# Patient Record
Sex: Male | Born: 1955 | Race: Black or African American | Hispanic: No | Marital: Married | Smoking: Former smoker
Health system: Southern US, Community
[De-identification: ages and names within clinical notes are randomized; demographics above are authoritative.]

## PROBLEM LIST (undated history)

## (undated) DIAGNOSIS — F1910 Other psychoactive substance abuse, uncomplicated: Secondary | ICD-10-CM

## (undated) DIAGNOSIS — C801 Malignant (primary) neoplasm, unspecified: Secondary | ICD-10-CM

## (undated) DIAGNOSIS — Z923 Personal history of irradiation: Secondary | ICD-10-CM

## (undated) DIAGNOSIS — E785 Hyperlipidemia, unspecified: Secondary | ICD-10-CM

## (undated) DIAGNOSIS — I351 Nonrheumatic aortic (valve) insufficiency: Secondary | ICD-10-CM

## (undated) DIAGNOSIS — I10 Essential (primary) hypertension: Secondary | ICD-10-CM

## (undated) DIAGNOSIS — R0600 Dyspnea, unspecified: Secondary | ICD-10-CM

## (undated) DIAGNOSIS — T7840XA Allergy, unspecified, initial encounter: Secondary | ICD-10-CM

## (undated) DIAGNOSIS — K219 Gastro-esophageal reflux disease without esophagitis: Secondary | ICD-10-CM

## (undated) DIAGNOSIS — I739 Peripheral vascular disease, unspecified: Secondary | ICD-10-CM

## (undated) DIAGNOSIS — I70219 Atherosclerosis of native arteries of extremities with intermittent claudication, unspecified extremity: Secondary | ICD-10-CM

## (undated) DIAGNOSIS — J439 Emphysema, unspecified: Secondary | ICD-10-CM

## (undated) DIAGNOSIS — R011 Cardiac murmur, unspecified: Secondary | ICD-10-CM

## (undated) HISTORY — DX: Nonrheumatic aortic (valve) insufficiency: I35.1

## (undated) HISTORY — DX: Gastro-esophageal reflux disease without esophagitis: K21.9

## (undated) HISTORY — DX: Cardiac murmur, unspecified: R01.1

## (undated) HISTORY — DX: Hyperlipidemia, unspecified: E78.5

## (undated) HISTORY — DX: Peripheral vascular disease, unspecified: I73.9

## (undated) HISTORY — DX: Emphysema, unspecified: J43.9

## (undated) HISTORY — DX: Other psychoactive substance abuse, uncomplicated: F19.10

## (undated) HISTORY — PX: NO PAST SURGERIES: SHX2092

## (undated) HISTORY — DX: Atherosclerosis of native arteries of extremities with intermittent claudication, unspecified extremity: I70.219

## (undated) HISTORY — DX: Allergy, unspecified, initial encounter: T78.40XA

---

## 2000-12-13 ENCOUNTER — Emergency Department (HOSPITAL_COMMUNITY): Admission: EM | Admit: 2000-12-13 | Discharge: 2000-12-13 | Payer: Self-pay | Admitting: Emergency Medicine

## 2000-12-13 ENCOUNTER — Encounter: Payer: Self-pay | Admitting: Emergency Medicine

## 2001-05-12 ENCOUNTER — Encounter: Payer: Self-pay | Admitting: Chiropractor

## 2001-05-12 ENCOUNTER — Ambulatory Visit (HOSPITAL_COMMUNITY): Admission: RE | Admit: 2001-05-12 | Discharge: 2001-05-12 | Payer: Self-pay | Admitting: Chiropractor

## 2011-12-20 ENCOUNTER — Emergency Department (HOSPITAL_COMMUNITY): Payer: BC Managed Care – PPO

## 2011-12-20 ENCOUNTER — Encounter (HOSPITAL_COMMUNITY): Payer: Self-pay | Admitting: Internal Medicine

## 2011-12-20 ENCOUNTER — Observation Stay (HOSPITAL_COMMUNITY)
Admission: EM | Admit: 2011-12-20 | Discharge: 2011-12-22 | Disposition: A | Discharge status: Home / Self Care | Payer: BC Managed Care – PPO | Attending: Internal Medicine | Admitting: Internal Medicine

## 2011-12-20 DIAGNOSIS — Z7982 Long term (current) use of aspirin: Secondary | ICD-10-CM | POA: Insufficient documentation

## 2011-12-20 DIAGNOSIS — R0602 Shortness of breath: Secondary | ICD-10-CM | POA: Insufficient documentation

## 2011-12-20 DIAGNOSIS — I10 Essential (primary) hypertension: Secondary | ICD-10-CM | POA: Insufficient documentation

## 2011-12-20 DIAGNOSIS — I080 Rheumatic disorders of both mitral and aortic valves: Secondary | ICD-10-CM | POA: Insufficient documentation

## 2011-12-20 DIAGNOSIS — R030 Elevated blood-pressure reading, without diagnosis of hypertension: Secondary | ICD-10-CM

## 2011-12-20 DIAGNOSIS — I498 Other specified cardiac arrhythmias: Secondary | ICD-10-CM | POA: Insufficient documentation

## 2011-12-20 DIAGNOSIS — R079 Chest pain, unspecified: Secondary | ICD-10-CM

## 2011-12-20 DIAGNOSIS — Z720 Tobacco use: Secondary | ICD-10-CM | POA: Diagnosis present

## 2011-12-20 DIAGNOSIS — K219 Gastro-esophageal reflux disease without esophagitis: Secondary | ICD-10-CM | POA: Insufficient documentation

## 2011-12-20 DIAGNOSIS — I5030 Unspecified diastolic (congestive) heart failure: Secondary | ICD-10-CM | POA: Insufficient documentation

## 2011-12-20 DIAGNOSIS — R42 Dizziness and giddiness: Secondary | ICD-10-CM | POA: Insufficient documentation

## 2011-12-20 DIAGNOSIS — F17200 Nicotine dependence, unspecified, uncomplicated: Secondary | ICD-10-CM | POA: Insufficient documentation

## 2011-12-20 DIAGNOSIS — IMO0001 Reserved for inherently not codable concepts without codable children: Secondary | ICD-10-CM

## 2011-12-20 DIAGNOSIS — R001 Bradycardia, unspecified: Secondary | ICD-10-CM

## 2011-12-20 DIAGNOSIS — R0789 Other chest pain: Principal | ICD-10-CM | POA: Insufficient documentation

## 2011-12-20 HISTORY — DX: Essential (primary) hypertension: I10

## 2011-12-20 LAB — POCT I-STAT TROPONIN I: Troponin i, poc: 0.02 ng/mL (ref 0.00–0.08)

## 2011-12-20 LAB — POCT I-STAT, CHEM 8
Hemoglobin: 14.6 g/dL (ref 13.0–17.0)
Sodium: 142 mEq/L (ref 135–145)
TCO2: 26 mmol/L (ref 0–100)

## 2011-12-20 NOTE — H&P (Signed)
Albert Hardy. is an 56 y.o. male.   Patient was seen and examined on December 20, 2011. PCP - none. Chief Complaint: Chest pain. HPI: 56 year-old male with ongoing tobacco abuse presents with complaints of chest pain. Patient has been having these symptoms for last 3 days. The pain is on the left side pressure-like sometimes radiating to neck and the left hand associated with mild shortness of breath. The pain lasted for around 15-20 minutes and got relieved spontaneously. In the ER cardiac enzymes and chest x-ray was unremarkable. EKG shows T-wave inversions in inferior and lateral leads. Patient's blood pressure was also elevated. Patient states that he had seen physicians earlier couple of years ago and was told he had normal blood pressure. Patient denies any drug abuse and only drinks alcohol very occasionally. Denies any nausea vomiting abdominal pain fever chills or any productive cough. Patient has been admitted for further management.  History reviewed. No pertinent past medical history.  History reviewed. No pertinent past surgical history.  Family History  Problem Relation Age of Onset  . Other Brother    Social History:  reports that he has been smoking.  He does not have any smokeless tobacco history on file. He reports that he drinks alcohol. He reports that he does not use illicit drugs.  Allergies: No Known Allergies   (Not in a hospital admission)  Results for orders placed during the hospital encounter of 12/20/11 (from the past 48 hour(s))  POCT I-STAT TROPONIN I     Status: Normal   Collection Time   12/20/11  9:29 PM      Component Value Range Comment   Troponin i, poc 0.02  0.00 - 0.08 ng/mL    Comment 3            POCT I-STAT, CHEM 8     Status: Abnormal   Collection Time   12/20/11  9:32 PM      Component Value Range Comment   Sodium 142  135 - 145 mEq/L    Potassium 4.2  3.5 - 5.1 mEq/L    Chloride 106  96 - 112 mEq/L    BUN 22  6 - 23 mg/dL    Creatinine, Ser 1.61  0.50 - 1.35 mg/dL    Glucose, Bld 096 (*) 70 - 99 mg/dL    Calcium, Ion 0.45  4.09 - 1.23 mmol/L    TCO2 26  0 - 100 mmol/L    Hemoglobin 14.6  13.0 - 17.0 g/dL    HCT 81.1  91.4 - 78.2 %    Dg Chest Portable 1 View  12/20/2011  *RADIOLOGY REPORT*  Clinical Data: Chest pain  PORTABLE CHEST - 1 VIEW  Comparison: None  Findings: The heart size and mediastinal contours are within normal limits.  Both lungs are clear.  The visualized skeletal structures are unremarkable.  IMPRESSION: Negative exam.   Original Report Authenticated By: Signa Kell, M.D.     Review of Systems  Constitutional: Negative.   HENT: Negative.   Eyes: Negative.   Respiratory: Negative.   Cardiovascular: Positive for chest pain.  Gastrointestinal: Negative.   Genitourinary: Negative.   Musculoskeletal: Negative.   Skin: Negative.   Neurological: Negative.   Endo/Heme/Allergies: Negative.   Psychiatric/Behavioral: Negative.     Blood pressure 150/54, pulse 47, temperature 98.6 F (37 C), temperature source Oral, resp. rate 15, height 5\' 9"  (1.753 m), weight 80.74 kg (178 lb), SpO2 100.00%. Physical Exam  Constitutional: He is oriented  to person, place, and time. He appears well-developed and well-nourished. No distress.  HENT:  Head: Normocephalic and atraumatic.  Right Ear: External ear normal.  Left Ear: External ear normal.  Nose: Nose normal.  Mouth/Throat: Oropharynx is clear and moist. No oropharyngeal exudate.  Eyes: Conjunctivae normal are normal. Pupils are equal, round, and reactive to light. Right eye exhibits no discharge. Left eye exhibits no discharge. No scleral icterus.  Neck: Normal range of motion. Neck supple.  Cardiovascular: Normal rate and regular rhythm.   Respiratory: Effort normal and breath sounds normal.  GI: Soft. Bowel sounds are normal. He exhibits no distension. There is no tenderness. There is no rebound.  Musculoskeletal: He exhibits no edema and no  tenderness.  Neurological: He is alert and oriented to person, place, and time.       Moves all extremities.  Skin: Skin is dry. He is not diaphoretic.     Assessment/Plan #1. Chest pain to rule out ACS - patient's symptoms are typical. At this time I have consulted on call cardiologist Dr. Sharyn Lull. Cardiologist will be seeing patient in consult. Given his EKG changes and symptoms patient will be kept n.p.o. for possible procedure. Check d-dimer. Aspirin. Check urine drug screen. #2. Elevated blood pressure - patient has been placed on nitroglycerin patch and when necessary IV hydralazine for systolic blood pressure was 160. #3. Tobacco abuse - patient advised to quit smoking.  CODE STATUS - full code.  KAKRAKANDY,ARSHAD N. 12/20/2011, 11:28 PM

## 2011-12-20 NOTE — ED Notes (Signed)
EKG shown to Dr. Jacubowitz. 

## 2011-12-20 NOTE — ED Notes (Signed)
Pt c/o chest pain x1 week.pt is alert. Pt feels tinglings up neck and down left arm.

## 2011-12-20 NOTE — ED Provider Notes (Signed)
History     CSN: [id number]  Arrival date & time 12/20/11  2013   First MD Initiated Contact with Patient 12/20/11 2047      Chief Complaint  Patient presents with  . Chest Pain     HPI 56 year old male presents with intermittent chest pain started today.  Associated with palpitations and some numbness in left arm.  Patient is asymptomatic at present time.  Discomfort associated with some dizziness and shortness of breath.  No diaphoresis.  Patient has risk factors including smoking.  Unsure about elevated cholesterol.  No family history.   No past medical history on file.  No past surgical history on file.  No family history on file.  History  Substance Use Topics  . Smoking status: Not on file  . Smokeless tobacco: Not on file  . Alcohol Use: Not on file      Review of Systems All other systems reviewed and are negative Allergies  Review of patient's allergies indicates no known allergies.  Home Medications   Current Outpatient Rx  Name  Route  Sig  Dispense  Refill  . ASPIRIN 81 MG PO TABS   Oral   Take 81 mg by mouth daily.         . IBUPROFEN 200 MG PO TABS   Oral   Take 200 mg by mouth every 6 (six) hours as needed. Pain           BP 150/54  Pulse 47  Temp 98.6 F (37 C) (Oral)  Resp 15  Ht 5\' 9"  (1.753 m)  Wt 178 lb (80.74 kg)  BMI 26.29 kg/m2  SpO2 100%  Physical Exam  Nursing note and vitals reviewed. Constitutional: He is oriented to person, place, and time. He appears well-developed and well-nourished. No distress.  HENT:  Head: Normocephalic and atraumatic.  Eyes: Pupils are equal, round, and reactive to light.  Neck: Normal range of motion.  Cardiovascular: Normal rate and intact distal pulses.   Pulmonary/Chest: No respiratory distress. He has no wheezes. He has no rales.  Abdominal: Normal appearance. He exhibits no distension.  Musculoskeletal: Normal range of motion.  Neurological: He is alert and oriented to person,  place, and time. No cranial nerve deficit.  Skin: Skin is warm and dry. No rash noted.  Psychiatric: He has a normal mood and affect. His behavior is normal.    ED Course  Procedures (including critical care time)  EKG: SINUS RHYTHM ~ normal P axis,  V-rate 60 PROBABLE LEFT ATRIAL ABNORMALITY  ABNORMAL T, CONSIDER ISCHEMIA,  Abnormal ECG    Labs Reviewed  POCT I-STAT, CHEM 8 - Abnormal; Notable for the following:    Glucose, Bld 130 (*)     All other components within normal limits  POCT I-STAT TROPONIN I   Dg Chest Portable 1 View  12/20/2011  *RADIOLOGY REPORT*  Clinical Data: Chest pain  PORTABLE CHEST - 1 VIEW  Comparison: None  Findings: The heart size and mediastinal contours are within normal limits.  Both lungs are clear.  The visualized skeletal structures are unremarkable.  IMPRESSION: Negative exam.   Original Report Authenticated By: Signa Kell, M.D.      1. Chest pain       MDM  Plan on admission.        Nelia Shi, MD 12/20/11 2258

## 2011-12-21 ENCOUNTER — Encounter (HOSPITAL_COMMUNITY): Payer: Self-pay | Admitting: *Deleted

## 2011-12-21 DIAGNOSIS — F17200 Nicotine dependence, unspecified, uncomplicated: Secondary | ICD-10-CM

## 2011-12-21 DIAGNOSIS — I498 Other specified cardiac arrhythmias: Secondary | ICD-10-CM

## 2011-12-21 LAB — LIPID PANEL
Cholesterol: 214 mg/dL — ABNORMAL HIGH (ref 0–200)
Triglycerides: 49 mg/dL (ref <150)
VLDL: 10 mg/dL (ref 0–40)

## 2011-12-21 LAB — COMPREHENSIVE METABOLIC PANEL
CO2: 25 mEq/L (ref 19–32)
Calcium: 8.8 mg/dL (ref 8.4–10.5)
Creatinine, Ser: 0.89 mg/dL (ref 0.50–1.35)
GFR calc Af Amer: >90 mL/min (ref >90)
GFR calc non Af Amer: >90 mL/min (ref >90)
Glucose, Bld: 90 mg/dL (ref 70–99)

## 2011-12-21 LAB — CBC WITH DIFFERENTIAL/PLATELET
Basophils Relative: 0 % (ref 0–1)
Hemoglobin: 13.9 g/dL (ref 13.0–17.0)
Lymphs Abs: 2.1 10*3/uL (ref 0.7–4.0)
MCHC: 34.7 g/dL (ref 30.0–36.0)
Monocytes Relative: 12 % (ref 3–12)
Neutro Abs: 2.4 10*3/uL (ref 1.7–7.7)
Neutrophils Relative %: 47 % (ref 43–77)
RBC: 4.63 MIL/uL (ref 4.22–5.81)

## 2011-12-21 LAB — RAPID URINE DRUG SCREEN, HOSP PERFORMED
Barbiturates: NOT DETECTED
Benzodiazepines: NOT DETECTED
Cocaine: NOT DETECTED
Tetrahydrocannabinol: POSITIVE — AB

## 2011-12-21 LAB — CBC
HCT: 38.3 % — ABNORMAL LOW (ref 39.0–52.0)
Hemoglobin: 13.2 g/dL (ref 13.0–17.0)
MCH: 29.9 pg (ref 26.0–34.0)
MCHC: 34.5 g/dL (ref 30.0–36.0)
RDW: 13.5 % (ref 11.5–15.5)

## 2011-12-21 LAB — CREATININE, SERUM: Creatinine, Ser: 0.9 mg/dL (ref 0.50–1.35)

## 2011-12-21 LAB — GLUCOSE, CAPILLARY
Glucose-Capillary: 79 mg/dL (ref 70–99)
Glucose-Capillary: 89 mg/dL (ref 70–99)

## 2011-12-21 LAB — TSH: TSH: 0.535 u[IU]/mL (ref 0.350–4.500)

## 2011-12-21 MED ORDER — LISINOPRIL 20 MG PO TABS
20.0000 mg | ORAL_TABLET | Freq: Every day | ORAL | Status: DC
  Administered 2011-12-21 – 2011-12-22 (×2): 20 mg via ORAL
  Filled 2011-12-21 (×2): qty 1

## 2011-12-21 MED ORDER — SODIUM CHLORIDE 0.9 % IJ SOLN
3.0000 mL | Freq: Two times a day (BID) | INTRAMUSCULAR | Status: DC
  Administered 2011-12-21 – 2011-12-22 (×2): 3 mL via INTRAVENOUS

## 2011-12-21 MED ORDER — NICOTINE 14 MG/24HR TD PT24
14.0000 mg | MEDICATED_PATCH | Freq: Every day | TRANSDERMAL | Status: DC
  Administered 2011-12-21 – 2011-12-22 (×2): 14 mg via TRANSDERMAL
  Filled 2011-12-21 (×3): qty 1

## 2011-12-21 MED ORDER — SODIUM CHLORIDE 0.9 % IJ SOLN
3.0000 mL | Freq: Two times a day (BID) | INTRAMUSCULAR | Status: DC
  Administered 2011-12-21: 3 mL via INTRAVENOUS

## 2011-12-21 MED ORDER — ONDANSETRON HCL 4 MG PO TABS: 4.0000 mg | ORAL_TABLET | Freq: Four times a day (QID) | ORAL | Status: DC | PRN

## 2011-12-21 MED ORDER — HYDRALAZINE HCL 20 MG/ML IJ SOLN
10.0000 mg | INTRAMUSCULAR | Status: DC | PRN
  Administered 2011-12-21: 10 mg via INTRAVENOUS
  Filled 2011-12-21: qty 1

## 2011-12-21 MED ORDER — NITROGLYCERIN 0.4 MG/HR TD PT24
0.4000 mg | MEDICATED_PATCH | Freq: Every day | TRANSDERMAL | Status: DC
  Administered 2011-12-21 – 2011-12-22 (×2): 0.4 mg via TRANSDERMAL
  Filled 2011-12-21 (×2): qty 1

## 2011-12-21 MED ORDER — ONDANSETRON HCL 4 MG/2ML IJ SOLN: 4.0000 mg | Freq: Four times a day (QID) | INTRAMUSCULAR | Status: DC | PRN

## 2011-12-21 MED ORDER — ACETAMINOPHEN 650 MG RE SUPP: 650.0000 mg | Freq: Four times a day (QID) | RECTAL | Status: DC | PRN

## 2011-12-21 MED ORDER — ACETAMINOPHEN 325 MG PO TABS
650.0000 mg | ORAL_TABLET | Freq: Four times a day (QID) | ORAL | Status: DC | PRN
  Administered 2011-12-22: 650 mg via ORAL
  Filled 2011-12-21: qty 2

## 2011-12-21 MED ORDER — PANTOPRAZOLE SODIUM 40 MG PO TBEC
40.0000 mg | DELAYED_RELEASE_TABLET | Freq: Every day | ORAL | Status: DC
  Administered 2011-12-21 – 2011-12-22 (×2): 40 mg via ORAL
  Filled 2011-12-21 (×3): qty 1

## 2011-12-21 MED ORDER — ASPIRIN EC 325 MG PO TBEC
325.0000 mg | DELAYED_RELEASE_TABLET | Freq: Every day | ORAL | Status: DC
  Administered 2011-12-21: 325 mg via ORAL
  Filled 2011-12-21: qty 1

## 2011-12-21 MED ORDER — ASPIRIN EC 81 MG PO TBEC
81.0000 mg | DELAYED_RELEASE_TABLET | Freq: Every day | ORAL | Status: DC
  Administered 2011-12-22: 81 mg via ORAL
  Filled 2011-12-21: qty 1

## 2011-12-21 MED ORDER — ENOXAPARIN SODIUM 40 MG/0.4ML ~~LOC~~ SOLN
40.0000 mg | SUBCUTANEOUS | Status: DC
  Administered 2011-12-21 – 2011-12-22 (×2): 40 mg via SUBCUTANEOUS
  Filled 2011-12-21 (×2): qty 0.4

## 2011-12-21 NOTE — Progress Notes (Signed)
[facility] PROGRESS NOTE  Albert Hardy. MVH:[id number] DOB: 03-Jul-1955 DOA: 12/20/2011 PCP: No primary provider on file.  Assessment/Plan: 1-Chest pain: typical presentation and with risk factors for ACS (tobacco abuse, HTN, Gender). -CE'z negative so far -Continue nitrate and ASA -start ACE inhibitor -PRN morphine and Oxygen -Will start him on PPI (in case component of GERD with CP) -No B-blocker due to bradycardia -Follow 2-D echo results -Myoview stress test in am  2-HTN: -Heart healthy diet -start lisinopril  3-Tobacco abuse: -Counseling for cessation provided -nicotine patch ordered  4-Bradycardia: -Sinus in nature; patient with hx of playing basketball actively until he was 56 y/o; might be normal for him. -Will monitor.   5-DVT: Lovenox   Code Status: Full Family Communication: No family at bedside. Disposition Plan: home when medically stable; stress test tomorrow.    Consultants:  Cardiology (DR. Harwani)  Procedures:  Myoview stress test (planned for tomorrow 12/22/11)  Antibiotics:  none  HPI/Subjective:   Objective: Filed Vitals:   12/20/11 2330 12/20/11 2345 12/21/11 0115 12/21/11 0500  BP: 184/62 177/65 195/65 158/64  Pulse: 50 50 50 50  Temp:   97.9 F (36.6 C) 99 F (37.2 C)  TempSrc:   Oral Oral  Resp: 13 19 20 20   Height:      Weight:   72.7 kg (160 lb 4.4 oz)   SpO2: 100% 100% 100% 100%    Intake/Output Summary (Last 24 hours) at 12/21/11 1043 Last data filed at 12/21/11 0932  Gross per 24 hour  Intake      3 ml  Output      0 ml  Net      3 ml   Filed Weights   12/20/11 2026 12/21/11 0115  Weight: 80.74 kg (178 lb) 72.7 kg (160 lb 4.4 oz)    Exam:   General:  NAD, denies CP or SOB; a little smoking urge/craving reported.  Cardiovascular: bradycardic, no rubs, no gallops, no murmurs  Respiratory: CTA bilaterally  Abdomen: soft, NT, ND, positive BS  Extremities: no edema, no cyanosis or  clubbing  Neuro: non focal  Data Reviewed: Basic Metabolic Panel:  Lab 12/21/11 8413 12/21/11 0140 12/20/11 2132  NA 135 -- 142  K 3.6 -- 4.2  CL 102 -- 106  CO2 25 -- --  GLUCOSE 90 -- 130*  BUN 15 -- 22  CREATININE 0.89 0.90 1.10  CALCIUM 8.8 -- --  MG -- -- --  PHOS -- -- --   Liver Function Tests:  Lab 12/21/11 0656  AST 20  ALT 13  ALKPHOS 54  BILITOT 0.4  PROT 6.3  ALBUMIN 3.2*   CBC:  Lab 12/21/11 0656 12/21/11 0140 12/20/11 2132  WBC 5.2 6.8 --  NEUTROABS 2.4 -- --  HGB 13.9 13.2 14.6  HCT 40.1 38.3* 43.0  MCV 86.6 86.7 --  PLT 165 168 --   Cardiac Enzymes:  Lab 12/21/11 0656 12/21/11 0140  CKTOTAL -- --  CKMB -- --  CKMBINDEX -- --  TROPONINI <0.30 <0.30   CBG:  Lab 12/21/11 0731 12/21/11 0500 12/21/11 0113  GLUCAP 94 87 79     Studies: Dg Chest Portable 1 View  12/20/2011  *RADIOLOGY REPORT*  Clinical Data: Chest pain  PORTABLE CHEST - 1 VIEW  Comparison: None  Findings: The heart size and mediastinal contours are within normal limits.  Both lungs are clear.  The visualized skeletal structures are unremarkable.  IMPRESSION: Negative exam.   Original Report Authenticated By: Signa Kell,  M.D.     Scheduled Meds:   . aspirin EC  325 mg Oral Daily  . enoxaparin (LOVENOX) injection  40 mg Subcutaneous Q24H  . lisinopril  20 mg Oral Daily  . nitroGLYCERIN  0.4 mg Transdermal Daily  . sodium chloride  3 mL Intravenous Q12H  . sodium chloride  3 mL Intravenous Q12H      Time spent: >30 minutes    Ayde Record  [facility] Pager [phone]. If 8PM-8AM, please contact night-coverage at [email or web address], password [facility] 12/21/2011, 10:43 AM  LOS: 1 day

## 2011-12-21 NOTE — Consult Note (Signed)
Reason for Consult: Chest pain Referring Physician: [facility]  Albert Hardy. is an 56 y.o. male.  HPI: Patient is 56-year male with no significant past medical history except for tobacco abuse was admitted because of recurrent retrosternal and left-sided chest pain described as tightness grade 6/10 associated with tingling numbness in left arm and neck lasting 20-30 minutes for last 2-3 days off and on without associated nausea vomiting diaphoresis patient states he gets occasional shortness of breath. Denies any PND orthopnea leg swelling denies history of exertional chest pain denies any cardiac workup in the past patient was noted to have minimally elevated blood pressure and mild T-wave changes in inferolateral leads. Patient presently denies any chest pain shortness of breath patient denies any palpitation lightheadedness or syncope but states occasional feels skipping of the heartbeat.  Past Medical History  Diagnosis Date  . Hypertension     History reviewed. No pertinent past surgical history.  Family History  Problem Relation Age of Onset  . Other Brother     Social History:  reports that he has been smoking.  He does not have any smokeless tobacco history on file. He reports that he drinks alcohol. He reports that he does not use illicit drugs.  Allergies: No Known Allergies  Medications: I have reviewed the patient's current medications.  Results for orders placed during the hospital encounter of 12/20/11 (from the past 48 hour(s))  POCT I-STAT TROPONIN I     Status: Normal   Collection Time   12/20/11  9:29 PM      Component Value Range Comment   Troponin i, poc 0.02  0.00 - 0.08 ng/mL    Comment 3            POCT I-STAT, CHEM 8     Status: Abnormal   Collection Time   12/20/11  9:32 PM      Component Value Range Comment   Sodium 142  135 - 145 mEq/L    Potassium 4.2  3.5 - 5.1 mEq/L    Chloride 106  96 - 112 mEq/L    BUN 22  6 - 23 mg/dL    Creatinine,  Ser 4.09  0.50 - 1.35 mg/dL    Glucose, Bld 811 (*) 70 - 99 mg/dL    Calcium, Ion 9.14  7.82 - 1.23 mmol/L    TCO2 26  0 - 100 mmol/L    Hemoglobin 14.6  13.0 - 17.0 g/dL    HCT 95.6  21.3 - 08.6 %   GLUCOSE, CAPILLARY     Status: Normal   Collection Time   12/21/11  1:13 AM      Component Value Range Comment   Glucose-Capillary 79  70 - 99 mg/dL   TROPONIN I     Status: Normal   Collection Time   12/21/11  1:40 AM      Component Value Range Comment   Troponin I <0.30  <0.30 ng/mL   CBC     Status: Abnormal   Collection Time   12/21/11  1:40 AM      Component Value Range Comment   WBC 6.8  4.0 - 10.5 K/uL    RBC 4.42  4.22 - 5.81 MIL/uL    Hemoglobin 13.2  13.0 - 17.0 g/dL    HCT 57.8 (*) 46.9 - 52.0 %    MCV 86.7  78.0 - 100.0 fL    MCH 29.9  26.0 - 34.0 pg    MCHC 34.5  30.0 - 36.0 g/dL    RDW 16.1  09.6 - 04.5 %    Platelets 168  150 - 400 K/uL   CREATININE, SERUM     Status: Normal   Collection Time   12/21/11  1:40 AM      Component Value Range Comment   Creatinine, Ser 0.90  0.50 - 1.35 mg/dL    GFR calc non Af Amer >90  >90 mL/min    GFR calc Af Amer >90  >90 mL/min   GLUCOSE, CAPILLARY     Status: Normal   Collection Time   12/21/11  5:00 AM      Component Value Range Comment   Glucose-Capillary 87  70 - 99 mg/dL    Comment 1 Notify RN     CBC WITH DIFFERENTIAL     Status: Normal   Collection Time   12/21/11  6:56 AM      Component Value Range Comment   WBC 5.2  4.0 - 10.5 K/uL    RBC 4.63  4.22 - 5.81 MIL/uL    Hemoglobin 13.9  13.0 - 17.0 g/dL    HCT 40.9  81.1 - 91.4 %    MCV 86.6  78.0 - 100.0 fL    MCH 30.0  26.0 - 34.0 pg    MCHC 34.7  30.0 - 36.0 g/dL    RDW 78.2  95.6 - 21.3 %    Platelets 165  150 - 400 K/uL    Neutrophils Relative 47  43 - 77 %    Neutro Abs 2.4  1.7 - 7.7 K/uL    Lymphocytes Relative 40  12 - 46 %    Lymphs Abs 2.1  0.7 - 4.0 K/uL    Monocytes Relative 12  3 - 12 %    Monocytes Absolute 0.6  0.1 - 1.0 K/uL    Eosinophils  Relative 2  0 - 5 %    Eosinophils Absolute 0.1  0.0 - 0.7 K/uL    Basophils Relative 0  0 - 1 %    Basophils Absolute 0.0  0.0 - 0.1 K/uL   D-DIMER, QUANTITATIVE     Status: Normal   Collection Time   12/21/11  7:00 AM      Component Value Range Comment   D-Dimer, Quant <0.27  0.00 - 0.48 ug/mL-FEU   GLUCOSE, CAPILLARY     Status: Normal   Collection Time   12/21/11  7:31 AM      Component Value Range Comment   Glucose-Capillary 94  70 - 99 mg/dL     Dg Chest Portable 1 View  12/20/2011  *RADIOLOGY REPORT*  Clinical Data: Chest pain  PORTABLE CHEST - 1 VIEW  Comparison: None  Findings: The heart size and mediastinal contours are within normal limits.  Both lungs are clear.  The visualized skeletal structures are unremarkable.  IMPRESSION: Negative exam.   Original Report Authenticated By: Signa Kell, M.D.     Review of Systems  Constitutional: Negative for fever, chills, weight loss and malaise/fatigue.  HENT: Negative for hearing loss and tinnitus.   Eyes: Negative for blurred vision and double vision.  Respiratory: Positive for shortness of breath. Negative for cough, hemoptysis, sputum production and wheezing.   Cardiovascular: Positive for chest pain. Negative for palpitations, orthopnea, claudication and leg swelling.  Gastrointestinal: Negative for heartburn, nausea, vomiting and abdominal pain.  Genitourinary: Negative for dysuria and urgency.  Neurological: Negative for dizziness and headaches.   Blood pressure 158/64, pulse  50, temperature 99 F (37.2 C), temperature source Oral, resp. rate 20, height 5\' 9"  (1.753 m), weight 72.7 kg (160 lb 4.4 oz), SpO2 100.00%. Physical Exam  Constitutional: He is oriented to person, place, and time. He appears well-developed and well-nourished.  HENT:  Head: Normocephalic and atraumatic.  Nose: Nose normal.  Mouth/Throat: No oropharyngeal exudate.  Eyes: Conjunctivae normal and EOM are normal. Pupils are equal, round, and  reactive to light. Left eye exhibits no discharge. No scleral icterus.  Neck: Normal range of motion. Neck supple. No JVD present. No tracheal deviation present. No thyromegaly present.  Cardiovascular:       Sinus bradycardia on the monitor with frequent PVCs S1-S2 normal there is soft systolic murmur no S3 gallop  Respiratory: Effort normal and breath sounds normal. No respiratory distress. He has no wheezes. He has no rales.  GI: Bowel sounds are normal. He exhibits no distension. There is no tenderness. There is no rebound and no guarding.  Musculoskeletal: He exhibits no edema and no tenderness.  Neurological: He is alert and oriented to person, place, and time.    Assessment/Plan: Status post chest pain worrisome for angina MI ruled out Onset hypertension Tobacco abuse Plan Discussed with patient options of treatment i.e. noninvasive stress testing versus catheterization benefits and consents for stress test  Albert Hardy N 12/21/2011, 7:34 AM

## 2011-12-21 NOTE — Progress Notes (Signed)
  Echocardiogram 2D Echocardiogram has been performed.  Monic Engelmann 12/21/2011, 10:45 AM 

## 2011-12-22 ENCOUNTER — Other Ambulatory Visit: Payer: Self-pay

## 2011-12-22 ENCOUNTER — Ambulatory Visit (HOSPITAL_COMMUNITY)
Admission: RE | Admit: 2011-12-22 | Discharge: 2011-12-22 | Disposition: A | Discharge status: Home / Self Care | Payer: BC Managed Care – PPO | Source: Ambulatory Visit | Attending: Cardiology | Admitting: Cardiology

## 2011-12-22 DIAGNOSIS — I10 Essential (primary) hypertension: Secondary | ICD-10-CM | POA: Insufficient documentation

## 2011-12-22 DIAGNOSIS — F17200 Nicotine dependence, unspecified, uncomplicated: Secondary | ICD-10-CM | POA: Insufficient documentation

## 2011-12-22 DIAGNOSIS — R079 Chest pain, unspecified: Secondary | ICD-10-CM | POA: Insufficient documentation

## 2011-12-22 LAB — BASIC METABOLIC PANEL
BUN: 16 mg/dL (ref 6–23)
CO2: 27 mEq/L (ref 19–32)
Chloride: 102 mEq/L (ref 96–112)
Creatinine, Ser: 1.11 mg/dL (ref 0.50–1.35)
Glucose, Bld: 85 mg/dL (ref 70–99)

## 2011-12-22 LAB — CBC
HCT: 38.9 % — ABNORMAL LOW (ref 39.0–52.0)
MCV: 87 fL (ref 78.0–100.0)
RBC: 4.47 MIL/uL (ref 4.22–5.81)
WBC: 7.3 10*3/uL (ref 4.0–10.5)

## 2011-12-22 LAB — GLUCOSE, CAPILLARY: Glucose-Capillary: 94 mg/dL (ref 70–99)

## 2011-12-22 MED ORDER — REGADENOSON 0.4 MG/5ML IV SOLN: 0.4000 mg | Freq: Once | INTRAVENOUS | Status: DC

## 2011-12-22 MED ORDER — AMLODIPINE BESYLATE 5 MG PO TABS
5.0000 mg | ORAL_TABLET | Freq: Every day | ORAL | Status: DC
  Administered 2011-12-22: 5 mg via ORAL
  Filled 2011-12-22: qty 1

## 2011-12-22 MED ORDER — LISINOPRIL 20 MG PO TABS: 20.0000 mg | ORAL_TABLET | Freq: Every day | ORAL | Status: DC

## 2011-12-22 MED ORDER — AMLODIPINE BESYLATE 5 MG PO TABS: 5.0000 mg | ORAL_TABLET | Freq: Every day | ORAL | Status: DC

## 2011-12-22 MED ORDER — TECHNETIUM TC 99M SESTAMIBI GENERIC - CARDIOLITE
10.0000 | Freq: Once | INTRAVENOUS | Status: AC | PRN
  Administered 2011-12-22: 10 via INTRAVENOUS

## 2011-12-22 MED ORDER — ACETAMINOPHEN 325 MG PO TABS: 650.0000 mg | ORAL_TABLET | Freq: Four times a day (QID) | ORAL | Status: DC | PRN

## 2011-12-22 MED ORDER — NICOTINE 14 MG/24HR TD PT24: 1.0000 | MEDICATED_PATCH | TRANSDERMAL | Status: DC

## 2011-12-22 MED ORDER — PANTOPRAZOLE SODIUM 40 MG PO TBEC: 40.0000 mg | DELAYED_RELEASE_TABLET | Freq: Every day | ORAL | Status: DC

## 2011-12-22 MED ORDER — TECHNETIUM TC 99M SESTAMIBI GENERIC - CARDIOLITE
30.0000 | Freq: Once | INTRAVENOUS | Status: AC | PRN
  Administered 2011-12-22: 30 via INTRAVENOUS

## 2011-12-22 NOTE — Discharge Summary (Signed)
Physician Discharge Summary  Albert Hardy. XBM:[id number] DOB: 09-Sep-1955 DOA: 12/20/2011  PCP: No primary provider on file.  Admit date: 12/20/2011 Discharge date: 12/22/2011  Time spent: >30 minutes  Recommendations for Outpatient Follow-up:  -Establish care with a primary care physician and follow with him in the next 2 weeks -Take medications as prescribed -Stop smoking (use nicotine patches and call 1-800QUIT-NOW for further assistance if needed with quitting process. -Follow a low sodium (<2.5 G daily) heart healthy diet.  Discharge Diagnoses:  Chest pain Elevated blood pressure Diastolic Heart failure (Grade 2) Tobacco abuse GERD Bradycardia (sinus and asymptomatic)   Discharge Condition: stable and improved. Patient discharge home and advised to follow discharge medications and discharge recommendations as prescribed.  Diet recommendation: heart healthy diet  Filed Weights   12/20/11 2026 12/21/11 0115  Weight: 80.74 kg (178 lb) 72.7 kg (160 lb 4.4 oz)    History of present illness:  56 year-old male with ongoing tobacco abuse presents with complaints of chest pain. Patient has been having these symptoms for last 3 days. The pain is on the left side pressure-like sometimes radiating to neck and the left hand associated with mild shortness of breath. The pain lasted for around 15-20 minutes and got relieved spontaneously. In the ER cardiac enzymes and chest x-ray was unremarkable. EKG shows T-wave inversions in inferior and lateral leads. Patient's blood pressure was also elevated. Patient states that he had seen physicians earlier couple of years ago and was told he had normal blood pressure. Patient denies any drug abuse and only drinks alcohol very occasionally. Denies any nausea vomiting abdominal pain fever chills or any productive cough.   Hospital Course:  1-Chest pain: typical presentation and with risk factors for ACS (tobacco abuse, HTN, Gender).  -CE'z  negative X 3  -2- Echo demonstrated LV hypertrophy and grade 2 diastolic dysfunction; no wall motion abnormalities -Myoview w/o ischemic changes. -Discharged on ASA, low sodium diet and antihypertensive regimen using amlodipine and lisinopril. Patient advised to quit smoking. -He remains CP free for the rest of hospitalization.  2-HTN:  -Heart healthy diet  -discharge on lisinopril and amlodipine  3-Tobacco abuse:  -Counseling for cessation provided  -nicotine patch prescribed at discharge   4-Bradycardia:  -Sinus in nature; patient with hx of playing basketball actively until he was 56 y/o; might be normal for him.   5-Diastolic heart failure (EF55-60%) -Control BP -Follow low sodium diet -Follow up by PCP and at that time if BP still no well control, consider starting diuretic agent.  6-GERD: -started on Protonix -Avoid NSAID's - Quit smoking  Procedures:  2-D echo (EF 55-60%; no wall motion abnormalities. Findings demonstrated Grade 2 diastolic HF and LV hypertrophy)   Myoview stress test (no signs of ischemia)  Consultations:  Cardiology (Dr. Sharyn Lull)  Discharge Exam: Filed Vitals:   12/21/11 1916 12/21/11 2140 12/22/11 0516 12/22/11 1338  BP: 166/57 165/68 166/50 165/70  Pulse: 63 59 56   Temp:  98.5 F (36.9 C) 98.4 F (36.9 C) 98.3 F (36.8 C)  TempSrc:  Oral Oral Oral  Resp:  18 18   Height:      Weight:      SpO2:  98% 99% 100%   General: NAD, denies CP or SOB; a little smoking urge/craving reported.  Cardiovascular: sinus bradycardic, no rubs, no gallops, no murmurs  Respiratory: CTA bilaterally  Abdomen: soft, NT, ND, positive BS  Extremities: no edema, no cyanosis or clubbing  Neuro: non focal  Discharge  Instructions  Discharge Orders    Future Orders Please Complete By Expires   Discharge instructions      Comments:   -Establish care with a primary care physician and follow with him in the next 2 weeks -Take medications as  prescribed -Stop smoking (use nicotine patches and call 1-800QUIT-NOW for further assistance if needed with quitting process. -Follow a low sodium (<2.5 G daily) heart healthy diet.       Medication List     As of 12/22/2011  4:48 PM    STOP taking these medications         ibuprofen 200 MG tablet   Commonly known as: ADVIL,MOTRIN      TAKE these medications         acetaminophen 325 MG tablet   Commonly known as: TYLENOL   Take 2 tablets (650 mg total) by mouth every 6 (six) hours as needed for pain (or Fever >/= 101).      amLODipine 5 MG tablet   Commonly known as: NORVASC   Take 1 tablet (5 mg total) by mouth daily.      aspirin 81 MG tablet   Take 81 mg by mouth daily.      lisinopril 20 MG tablet   Commonly known as: PRINIVIL,ZESTRIL   Take 1 tablet (20 mg total) by mouth daily.      nicotine 14 mg/24hr patch   Commonly known as: NICODERM CQ - dosed in mg/24 hours   Place 1 patch onto the skin daily.      pantoprazole 40 MG tablet   Commonly known as: PROTONIX   Take 1 tablet (40 mg total) by mouth daily at 12 noon.          The results of significant diagnostics from this hospitalization (including imaging, microbiology, ancillary and laboratory) are listed below for reference.    Significant Diagnostic Studies: Nm Myocar Multi W/spect W/wall Motion / Ef  12/22/2011  *RADIOLOGY REPORT*  Clinical Data:  Chest pain and history of hypertension and tobacco use.  MYOCARDIAL IMAGING WITH SPECT (REST AND PHARMACOLOGIC-STRESS) GATED LEFT VENTRICULAR WALL MOTION STUDY LEFT VENTRICULAR EJECTION FRACTION  Technique:  Standard myocardial SPECT imaging was performed after resting intravenous injection of 10 mCi Tc-10m sestamibi. Subsequently, intravenous infusion of Lexiscan was performed under the supervision of the Cardiology staff.  At peak effect of the drug, 30 mCi Tc-22m sestamibi was injected intravenously and standard myocardial SPECT  imaging was performed.   Quantitative gated imaging was also performed to evaluate left ventricular wall motion, and estimate left ventricular ejection fraction.  Comparison:  None.  Findings: Utilizing gated data, the end-diastolic volume is estimated to be 184 ml and the end-systolic volume 91 ml. Calculated ejection fraction is 51%.  Gated wall motion analysis shows normal left ventricular wall motion.  SPECT imaging shows attenuation of the basilar aspect of the inferior wall.  Component of scar is suspected.  However, this may also be secondary to a component of diaphragmatic attenuation. There is no evidence of inducible ischemia with Lexiscan administration.  IMPRESSION:  1.  Normal left ventricular function with quantitative ejection fraction of 51% and normal wall motion identified. 2.  Possible component of inferior wall scar involving the basilar aspect of the inferior wall.  There may be additional component of diaphragmatic attenuation.  No inducible ischemia.   Original Report Authenticated By: Irish Lack, M.D.    Dg Chest Portable 1 View  12/20/2011  *RADIOLOGY REPORT*  Clinical Data: Chest  pain  PORTABLE CHEST - 1 VIEW  Comparison: None  Findings: The heart size and mediastinal contours are within normal limits.  Both lungs are clear.  The visualized skeletal structures are unremarkable.  IMPRESSION: Negative exam.   Original Report Authenticated By: Signa Kell, M.D.     Labs: Basic Metabolic Panel:  Lab 12/22/11 1610 12/21/11 0656 12/21/11 0140 12/20/11 2132  NA 137 135 -- 142  K 3.8 3.6 -- 4.2  CL 102 102 -- 106  CO2 27 25 -- --  GLUCOSE 85 90 -- 130*  BUN 16 15 -- 22  CREATININE 1.11 0.89 0.90 1.10  CALCIUM 8.7 8.8 -- --  MG -- -- -- --  PHOS -- -- -- --   Liver Function Tests:  Lab 12/21/11 0656  AST 20  ALT 13  ALKPHOS 54  BILITOT 0.4  PROT 6.3  ALBUMIN 3.2*   CBC:  Lab 12/22/11 0445 12/21/11 0656 12/21/11 0140 12/20/11 2132  WBC 7.3 5.2 6.8 --  NEUTROABS -- 2.4 -- --  HGB  13.1 13.9 13.2 14.6  HCT 38.9* 40.1 38.3* 43.0  MCV 87.0 86.6 86.7 --  PLT 164 165 168 --   Cardiac Enzymes:  Lab 12/21/11 1327 12/21/11 0656 12/21/11 0140  CKTOTAL -- -- --  CKMB -- -- --  CKMBINDEX -- -- --  TROPONINI <0.30 <0.30 <0.30   CBG:  Lab 12/22/11 0731 12/22/11 0514 12/22/11 0007 12/21/11 2016 12/21/11 1624  GLUCAP 94 78 76 164* 89       Signed:  Elli Groesbeck  [facility] 12/22/2011, 4:48 PM

## 2011-12-22 NOTE — Progress Notes (Signed)
Subjective:  Patient denies any chest pain or shortness of breath  Objective:  Vital Signs in the last 24 hours: Temp:  [98.4 F (36.9 C)-98.5 F (36.9 C)] 98.4 F (36.9 C) (11/12 0516) Pulse Rate:  [56-63] 56  (11/12 0516) Resp:  [16-18] 16  (11/12 0937) BP: (164-212)/(50-90) 164/77 mmHg (11/12 0952) SpO2:  [98 %-100 %] 99 % (11/12 0516)  Intake/Output from previous day: 11/11 0701 - 11/12 0700 In: 243 [P.O.:240; I.V.:3] Out: -  Intake/Output from this shift:    Physical Exam: Neck: no adenopathy, no carotid bruit, no JVD and supple, symmetrical, trachea midline Lungs: clear to auscultation bilaterally Heart: regular rate and rhythm, S1, S2 normal and Soft systolic murmur and S4 gallop noted Abdomen: soft, non-tender; bowel sounds normal; no masses,  no organomegaly Extremities: extremities normal, atraumatic, no cyanosis or edema Pulses: 2+ and symmetric  Lab Results:  Basename 12/22/11 0445 12/21/11 0656  WBC 7.3 5.2  HGB 13.1 13.9  PLT 164 165    Basename 12/22/11 0445 12/21/11 0656  NA 137 135  K 3.8 3.6  CL 102 102  CO2 27 25  GLUCOSE 85 90  BUN 16 15  CREATININE 1.11 0.89    Basename 12/21/11 1327 12/21/11 0656  TROPONINI <0.30 <0.30   Hepatic Function Panel  Basename 12/21/11 0656  PROT 6.3  ALBUMIN 3.2*  AST 20  ALT 13  ALKPHOS 54  BILITOT 0.4  BILIDIR --  IBILI --    Basename 12/21/11 0656  CHOL 214*   No results found for this basename: PROTIME in the last 72 hours  Imaging: Imaging results have been reviewed and Dg Chest Portable 1 View  12/20/2011  *RADIOLOGY REPORT*  Clinical Data: Chest pain  PORTABLE CHEST - 1 VIEW  Comparison: None  Findings: The heart size and mediastinal contours are within normal limits.  Both lungs are clear.  The visualized skeletal structures are unremarkable.  IMPRESSION: Negative exam.   Original Report Authenticated By: Signa Kell, M.D.     Cardiac Studies:  Assessment/Plan:  Status post chest  pain worrisome for angina MI ruled out  Onset hypertension  Tobacco abuse  Plan Continue present management Add Norvasc 5 mg daily Schedule for nuclear stress test today negative for ischemia may be discharged today from cardiac point of view  LOS: 2 days    Albert Hardy N 12/22/2011, 12:46 PM

## 2011-12-22 NOTE — Progress Notes (Signed)
Nitro dur patch removed from R deltoid

## 2012-01-11 ENCOUNTER — Ambulatory Visit (INDEPENDENT_AMBULATORY_CARE_PROVIDER_SITE_OTHER): Payer: BC Managed Care – PPO | Admitting: Internal Medicine

## 2012-01-11 VITALS — BP 160/58 | HR 52 | Temp 98.1°F | Resp 16 | Ht 68.5 in | Wt 161.0 lb

## 2012-01-11 DIAGNOSIS — I351 Nonrheumatic aortic (valve) insufficiency: Secondary | ICD-10-CM | POA: Insufficient documentation

## 2012-01-11 DIAGNOSIS — I10 Essential (primary) hypertension: Secondary | ICD-10-CM

## 2012-01-11 DIAGNOSIS — E785 Hyperlipidemia, unspecified: Secondary | ICD-10-CM

## 2012-01-11 DIAGNOSIS — K219 Gastro-esophageal reflux disease without esophagitis: Secondary | ICD-10-CM

## 2012-01-11 MED ORDER — LISINOPRIL-HYDROCHLOROTHIAZIDE 20-12.5 MG PO TABS: 1.0000 | ORAL_TABLET | Freq: Every day | ORAL | Status: DC

## 2012-01-11 MED ORDER — PANTOPRAZOLE SODIUM 40 MG PO TBEC: 40.0000 mg | DELAYED_RELEASE_TABLET | Freq: Every day | ORAL | Status: DC

## 2012-01-11 MED ORDER — AMLODIPINE BESYLATE 5 MG PO TABS: 5.0000 mg | ORAL_TABLET | Freq: Every day | ORAL | Status: DC

## 2012-01-11 NOTE — Progress Notes (Signed)
  Subjective:    Patient ID: Albert Hardy., male    DOB: 09-Jul-1955, 56 y.o.   MRN: [id number]  HPI first [facility] OV--referred to [place] for F/U after admission He was admitted to the hospital acutely on 12/20/2011 with chest pain and uncontrolled hypertension. Chest x-ray was clear. EKG showed T wave inversions in the inferior and lateral leads. His blood pressure was elevated:  184/62  177/65  195/65  158/64  Serial enzymes were negative. Routine labs were normal. According to hospital notes, An echo demonstrated left ventricular hypertrophy with grade 2 diastolic dysfunction, but no wall motion abnormality. Ejection fraction was 55-60%. Discharged 48 hours later with a diagnosis of HTN with normal stress tests/Myoview, ? mild hyperlipidemia, GERD, and was referred to [place] to establish ongoing care.  At the time of admission he was having blurred vision, headache and chest pain but since discharge he has had none of these symptoms. He has no past history of illness and has never been treated for hypertension, but he has avoided routine care. He stopped smoking during hospitalization is now using the patch declaring that he will not resume smoking. He is on a low-sodium diet, Protonix 20 mg, lisinopril 20 mg, and Norvasc 5 mg.  Family history-several members with hypertension but no heart attacks/1 aunt with diabetes/brother with elevated cholesterol Social history-no strenuous activity is bowling which he does now without problems     fam Hx HTN but no MIs Review of Systems No weight loss fever or night sweats  No vision changes  No Chest pain palpitations shortness of breath or edema  Was having lots of gas and burping prior hospitalization for several months. No heartburn. Protonic since stopped these symptoms however.  No urinary symptoms  No headaches or balance problems     Objective:   Physical Exam Vs BP 160/58 Carotids with no bruits but there is a referred systolic sound from the  aortic valve to the R carotid There is a grade 3 systolic ejection murmur and a grade 1 diastolic aortic regurgitation murmur at the right upper sternal border Pulse 58/regular Lungs are clear Extremities with no edema Neurological intact       Assessment & Plan:  His exam is consistent with aortic stenosis with aortic regurgitation causing his wide pulse pressure Review of his in-hospital echo confirms this problem with the aortic valve Although he is asymptomatic At this point, He needs to have careful followup to determine when surgery is most appropriate. I will establish a cardiology evaluation    He will followup for a complete physical examination with Dr. Dow Adolph in our appointment center/apparently they were in high school together/he will need assessment of his lipids plus routine health maintenance issues

## 2012-01-12 NOTE — Progress Notes (Signed)
Left message for patient to call back to schedule his appt. 

## 2012-01-15 NOTE — Progress Notes (Signed)
Sent pt a reminder note to schedule future CPE with Dr. Audria Nine. Albert Hardy

## 2012-01-25 ENCOUNTER — Encounter (INDEPENDENT_AMBULATORY_CARE_PROVIDER_SITE_OTHER): Payer: BC Managed Care – PPO | Admitting: *Deleted

## 2012-01-25 ENCOUNTER — Encounter (INDEPENDENT_AMBULATORY_CARE_PROVIDER_SITE_OTHER): Payer: BC Managed Care – PPO

## 2012-01-25 DIAGNOSIS — I739 Peripheral vascular disease, unspecified: Secondary | ICD-10-CM

## 2012-02-15 ENCOUNTER — Encounter: Payer: Self-pay | Admitting: Cardiology

## 2012-02-15 DIAGNOSIS — I739 Peripheral vascular disease, unspecified: Secondary | ICD-10-CM | POA: Insufficient documentation

## 2012-02-15 DIAGNOSIS — E785 Hyperlipidemia, unspecified: Secondary | ICD-10-CM | POA: Insufficient documentation

## 2012-02-15 NOTE — Progress Notes (Signed)
Patient ID: Sherwood Castilla., male   DOB: May 10, 1955, 57 y.o.   MRN: [id number]   [name]    Date of visit:  02/15/2012 DOB:  [birth year]    Age:  57 yrs. Medical record number:  [id number]     Account number:  [id number] Primary Care Provider: Ellamae Sia P ____________________________ CURRENT DIAGNOSES  1. Aortic Valve-Regurgitation  2. Peripheral Vascular Disease  3. Hypertension,Essential (Benign)  4. Hyperlipidemia  5. GERD ____________________________ ALLERGIES  atorvastatin, Myalgias ____________________________ MEDICATIONS  1. amlodipine 5 mg tablet, 1 p.o. daily  2. aspirin 81 mg tablet,chewable, 1 p.o. daily  3. Vitamin D3 1,000 unit tablet, 1 p.o. daily  4. CoQ-10 100 mg capsule, 1 p.o. daily  5. Vitamin B-12 500 mcg tablet, 1 p.o. daily  6. Fish Oil 1,000 mg capsule, 1 p.o. daily  7. lisinopril-hydrochlorothiazide 20-12.5 mg tablet, 1 p.o. daily  8. magnesium 200 mg tablet, 1 p.o. daily  9. multivitamin tablet, 1 p.o. daily  10. pantoprazole 40 mg tablet,delayed release (DR/EC), 1 p.o. daily  11. pravastatin 40 mg tablet, 1 p.o. daily ____________________________ HISTORY OF PRESENT ILLNESS  Patient seen for cardiac followup. Since he was previously here he had a set of arterial brachial indices showing occlusion of both superficial femoral arteries in her care brachial index of almost 0.5. He has claudication and has to stop walking 100 feet. He has successfully been able to stop smoking. He denies angina and has no PND or orthopnea. He developed very severe myalgias on Lipitor.  ____________________________ PAST HISTORY  Past Medical Illnesses:  hypertension, hyperlipidemia;  Cardiovascular Illnesses:  aortic insufficiency, peripheral vascular disease;  Surgical Procedures:  no prior surgical procedures;  Cardiology Procedures-Invasive:  no history of prior cardiac procedures;  Cardiology Procedures-Noninvasive:  echocardiogram November 2013, lexiscan cardiolite  November 2013;  Peripheral Vascular Procedures:  peripheral doppler December 2013;  LVEF of 55% documented via echocardiogram on 12/27/2011 ____________________________ CARDIO-PULMONARY TEST DATES EKG Date:  01/18/2012;  Nuclear Study Date:  12/28/2011;  Echocardiography Date: 12/27/2011;   ____________________________ SOCIAL HISTORY Alcohol Use:  6 pack q week;  Smoking:  used to smoke but quit 1-2 ppd 2013;  Diet:  low sodium (less than 2 grams);  Lifestyle:  married;  Exercise:  no regular exercise;  Occupation:  Geophysical data processor;  Residence:  lives with wife;   ____________________________ PHYSICAL EXAMINATION VITAL SIGNS  Blood Pressure:  120/70 Sitting, Left arm, regular cuff  , 118/64 Standing, Left arm and regular cuff   Pulse:  68/min. Weight:  163.00 lbs. Height:  68"BMI: 25  Constitutional:  pleasant African Americian male in no acute distress Skin:  warm and dry to touch, no apparent skin lesions, or masses noted. Head:  normocephalic, normal hair pattern, no masses or tenderness Chest:  normal symmetry, clear to auscultation and percussion. Cardiac:  regular rhythm, normal S1 and S2, No S3 or S4, 2/6 systolic murmur at aortic area  radiating to the neck and 2/6 blowing diastolic murmur at left sternal border Peripheral Pulses:  femoral pulses bounding with bruits, distal pulses diminished ____________________________ IMPRESSIONS/PLAN  1. Moderate to severe aortic regurgitation that is not symptomatic 2. Peripheral vascular disease with significant claudication 3. Hypertension controlled  Recommendations:  At the present time he has no shortness of breath and is not symptomatic from his aortic regurgitation. He has limiting claudication. He did not tolerate Lipitor due to myalgias. I recommended that he change to pravastatin 40 mg daily and that he have a vascular surgery consultation  as he has low ABIs. Followup in 3 months but call sooner if develops  problems. ____________________________ TODAYS ORDERS  1. Vasc Surgery Consult: Schedule ASAP  2. Return Visit: 3 months                       ____________________________ Cardiology Physician:  Darden Palmer MD [facility]

## 2012-03-26 ENCOUNTER — Other Ambulatory Visit: Payer: Self-pay

## 2012-04-11 ENCOUNTER — Other Ambulatory Visit: Payer: Self-pay | Admitting: Internal Medicine

## 2012-04-11 NOTE — Telephone Encounter (Signed)
Per last OV note 01/2012 pt to sched CPE with Dr. Audria Nine, needs OV and labs

## 2012-05-11 ENCOUNTER — Other Ambulatory Visit: Payer: Self-pay | Admitting: Physician Assistant

## 2012-05-18 ENCOUNTER — Other Ambulatory Visit: Payer: Self-pay | Admitting: Radiology

## 2012-05-18 DIAGNOSIS — K219 Gastro-esophageal reflux disease without esophagitis: Secondary | ICD-10-CM

## 2012-05-18 MED ORDER — PANTOPRAZOLE SODIUM 40 MG PO TBEC: 40.0000 mg | DELAYED_RELEASE_TABLET | Freq: Every day | ORAL | Status: DC

## 2012-05-18 NOTE — Telephone Encounter (Signed)
Unsure why this printed, have faxed.

## 2012-05-18 NOTE — Telephone Encounter (Signed)
Pharmacy called about protonix, patient over due for follow up, but does have appt scheduled on 05/20/12

## 2012-05-20 ENCOUNTER — Encounter: Payer: Self-pay | Admitting: Family Medicine

## 2012-05-20 ENCOUNTER — Ambulatory Visit (INDEPENDENT_AMBULATORY_CARE_PROVIDER_SITE_OTHER): Payer: BC Managed Care – PPO | Admitting: Family Medicine

## 2012-05-20 VITALS — BP 100/52 | HR 68 | Temp 97.0°F | Resp 16 | Ht 68.5 in | Wt 155.0 lb

## 2012-05-20 DIAGNOSIS — Z87891 Personal history of nicotine dependence: Secondary | ICD-10-CM | POA: Insufficient documentation

## 2012-05-20 DIAGNOSIS — I10 Essential (primary) hypertension: Secondary | ICD-10-CM

## 2012-05-20 DIAGNOSIS — K219 Gastro-esophageal reflux disease without esophagitis: Secondary | ICD-10-CM

## 2012-05-20 HISTORY — DX: Personal history of nicotine dependence: Z87.891

## 2012-05-20 MED ORDER — LISINOPRIL-HYDROCHLOROTHIAZIDE 20-12.5 MG PO TABS: ORAL_TABLET | ORAL | Status: DC

## 2012-05-20 MED ORDER — PANTOPRAZOLE SODIUM 40 MG PO TBEC: 40.0000 mg | DELAYED_RELEASE_TABLET | Freq: Every day | ORAL | Status: DC

## 2012-05-20 MED ORDER — AMLODIPINE BESYLATE 5 MG PO TABS: 5.0000 mg | ORAL_TABLET | Freq: Every day | ORAL | Status: DC

## 2012-05-20 NOTE — Progress Notes (Signed)
S:  This 57 y.o. AA male is here to establish care; he was recently diagnosed w/ HTN and Aortic Valve Disease. ECHO shows LVH w/ LVEF=55%. Pt  Also has PAD; he remains tobacco-free. He had follow-up with Dr.Tilley this week and statin was discontinued because pt developed severe myalgias. He has mild persistent muscle aches. Pt is tolerating other medications well. Pt denies diaphoresis, abnormal weight change, CP or tightness, palpitations, edema, SOB or cough, HA, dizziness, weakness or syncope. He has no dysphoric mood, nervousness or concentration difficulties or sleep disturbance.  ROS: As per HPI.  O: Filed Vitals:   05/20/12 1347  BP: 100/52  Pulse: 68  Temp: 97 F (36.1 C)  Resp: 16   GEN: In NAD; WN,WD. HENT: [place]/AT; EOMI w/ clear conj and muddy sclerae. Otherwise normal. NECK: Supple w/o LAN or TMG. COR: RRR w/ Grade 3/6 SEM radiating to neck and 2/6 diastolic murmur.  LUNGS: CTA; no rales or wheezes. MS: MAEs ; no deformities. NEURO: A&O x 3; CNs intact. Nonfocal.  A/P: Hypertension - stable and well controlled. Plan: amLODipine (NORVASC) 5 MG tablet  GERD (gastroesophageal reflux disease) - Plan: pantoprazole (PROTONIX) 40 MG tablet  Meds ordered this encounter  Medications  . lisinopril-hydrochlorothiazide (PRINZIDE,ZESTORETIC) 20-12.5 MG per tablet    Sig: Take 1 tablet by mouth by daily.    Dispense:  90 tablet    Refill:  1  . amLODipine (NORVASC) 5 MG tablet    Sig: Take 1 tablet (5 mg total) by mouth daily.    Dispense:  90 tablet    Refill:  1  . pantoprazole (PROTONIX) 40 MG tablet    Sig: Take 1 tablet (40 mg total) by mouth daily at 12 noon.    Dispense:  90 tablet    Refill:  1

## 2012-05-20 NOTE — Patient Instructions (Addendum)
For joint pain- try Tumeric (either in capsulated form) or use as spice on foods. Ginger also has anti-inflammatory properties. I would recommend avoiding red meats and other processed snacks (beef jerky, some luncheon meats, etc).

## 2012-08-21 ENCOUNTER — Other Ambulatory Visit: Payer: Self-pay | Admitting: Physician Assistant

## 2012-08-22 ENCOUNTER — Encounter: Payer: Self-pay | Admitting: Cardiology

## 2012-08-22 NOTE — Progress Notes (Signed)
Patient ID: Wendal Wilkie., male   DOB: Aug 20, 1955, 57 y.o.   MRN: [id number]   [name]    Date of visit:  08/22/2012 DOB:  09-05-1955    Age:  57 yrs. Medical record number:  [id number]     Account number:  [id number] Primary Care Provider: Dow Adolph ____________________________ CURRENT DIAGNOSES  1. Aortic Valve-Regurgitation  2. Peripheral Vascular Disease  3. Hypertension,Essential (Benign)  4. Hyperlipidemia  5. GERD ____________________________ ALLERGIES  Atorvastatin, Muscle aches  Pravastatin, Muscle aches ____________________________ MEDICATIONS  1. amlodipine 5 mg tablet, 1 p.o. daily  2. aspirin 81 mg tablet,chewable, 1 p.o. daily  3. Vitamin D3 1,000 unit tablet, 1 p.o. daily  4. CoQ-10 100 mg capsule, 1 p.o. daily  5. Vitamin B-12 500 mcg tablet, 1 p.o. daily  6. Fish Oil 1,000 mg capsule, 1 p.o. daily  7. lisinopril-hydrochlorothiazide 20-12.5 mg tablet, 1 p.o. daily  8. magnesium 200 mg tablet, 1 p.o. daily  9. multivitamin tablet, 1 p.o. daily  10. pantoprazole 40 mg tablet,delayed release (DR/EC), 1 p.o. daily ____________________________ CHIEF COMPLAINTS  Followup of Hypertension,Essential (Benign) ____________________________ HISTORY OF PRESENT ILLNESS  Patient seen for cardiac followup. The severe aching that he had completely resolved when he stopped taking the statin therapy. He still has some claudication but is not walking as much as we have had asked him to earlier. His blood pressure is under good control. He denies angina and has no shortness of breath, PND, orthopnea or edema. He still does get some claudication if he tries to walk. ____________________________ PAST HISTORY  Past Medical Illnesses:  hypertension, hyperlipidemia;  Cardiovascular Illnesses:  aortic insufficiency, peripheral vascular disease;  Surgical Procedures:  no prior surgical procedures;  Cardiology Procedures-Invasive:  no history of prior cardiac procedures;  Cardiology  Procedures-Noninvasive:  echocardiogram November 2013, lexiscan cardiolite November 2013;  Peripheral Vascular Procedures:  peripheral doppler December 2013;  LVEF of 55% documented via echocardiogram on 12/27/2011,   ____________________________ CARDIO-PULMONARY TEST DATES EKG Date:  01/18/2012;  Nuclear Study Date:  12/28/2011;  Echocardiography Date: 12/27/2011;   ____________________________ FAMILY HISTORY Brother -- Hypertension Brother -- Brother alive and well Father -- Myocardial infarction, Deceased Mother -- Hypertension Sister -- Sister alive and well Sister -- Hypertension Sister -- Diabetes mellitus ____________________________ SOCIAL HISTORY Alcohol Use:  6 pack q week;  Smoking:  used to smoke but quit 1-2 ppd 2013;  Diet:  low sodium (less than 2 grams);  Lifestyle:  married;  Exercise:  no regular exercise;  Occupation:  Geophysical data processor;  Residence:  lives with wife;   ____________________________ REVIEW OF SYSTEMS General:  denies recent weight change, fatique or change in exercise tolerance. Eyes: wears eye glasses/contact lenses, denies diplopia, glaucoma or visual field defects. Respiratory: denies dyspnea, cough, wheezing or hemoptysis. Cardiovascular:  please review HPI  Genitourinary-Male: hesitancy  Musculoskeletal:  chronic low back pain  ____________________________ PHYSICAL EXAMINATION VITAL SIGNS  Blood Pressure:  134/60 Sitting, Right arm, regular cuff   Pulse:  76/min. Weight:  157.00 lbs. Height:  68.5"BMI: 23  Constitutional:  pleasant African Americian male in no acute distress Skin:  warm and dry to touch, no apparent skin lesions, or masses noted. Head:  normocephalic, normal hair pattern, no masses or tenderness Chest:  normal symmetry, clear to auscultation and percussion. Cardiac:  regular rhythm, normal S1 and S2, No S3 or S4, 2/6 systolic murmur at aortic area  radiating to the neck and 2/6 blowing diastolic murmur at left sternal border Peripheral  Pulses:  femoral pulses bounding with bruits, distal pulses diminished ____________________________ IMPRESSIONS/PLAN  1. Moderate to severe aortic regurgitation 2. Hypertensive heart disease controlled 3. Hyperlipidemia with intolerance to 2 statins  Recommendations:  Encouraged patient to get more exercise and to walk to the point of limiting claudication. I don't think that he ever saw a vascular surgeon. He is not currently smoking and his blood pressure is currently controlled. Might be worth trying another statin at some point in the future.  Followup in 6 months with an echocardiogram at that time. ____________________________ TODAYS ORDERS  1. Return Visit: 6 months  2. 2D, color flow, doppler: 6 months                       ____________________________ Cardiology Physician:  Darden Palmer. MD [facility]

## 2012-09-23 ENCOUNTER — Encounter: Payer: BC Managed Care – PPO | Admitting: Family Medicine

## 2012-11-18 ENCOUNTER — Other Ambulatory Visit: Payer: Self-pay | Admitting: Family Medicine

## 2012-12-15 ENCOUNTER — Other Ambulatory Visit: Payer: Self-pay

## 2012-12-24 ENCOUNTER — Other Ambulatory Visit: Payer: Self-pay | Admitting: Physician Assistant

## 2013-01-09 ENCOUNTER — Telehealth: Payer: Self-pay

## 2013-01-09 MED ORDER — AMLODIPINE BESYLATE 5 MG PO TABS: 5.0000 mg | ORAL_TABLET | Freq: Every day | ORAL | Status: DC

## 2013-01-09 MED ORDER — LISINOPRIL-HYDROCHLOROTHIAZIDE 20-12.5 MG PO TABS: 1.0000 | ORAL_TABLET | Freq: Every day | ORAL | Status: DC

## 2013-01-09 NOTE — Telephone Encounter (Signed)
Sent in 3rd notice needs OV

## 2013-01-09 NOTE — Telephone Encounter (Signed)
Pt was to see dr Audria Nine on 09/23/12, but clinic was cancelled and he forgot to rescheduled. Pt has now rescheduled for 01/26/13, but states he will be out of 2 blood pressure rx before appt. Pt is requesting  a refill on both BP meds until he can see the dr. Rock Nephew uses [facility] on [address]

## 2013-01-26 ENCOUNTER — Ambulatory Visit (INDEPENDENT_AMBULATORY_CARE_PROVIDER_SITE_OTHER): Payer: BC Managed Care – PPO | Admitting: Family Medicine

## 2013-01-26 ENCOUNTER — Encounter: Payer: Self-pay | Admitting: Family Medicine

## 2013-01-26 VITALS — BP 110/66 | HR 64 | Temp 98.3°F | Resp 16 | Ht 68.0 in | Wt 156.2 lb

## 2013-01-26 DIAGNOSIS — K219 Gastro-esophageal reflux disease without esophagitis: Secondary | ICD-10-CM

## 2013-01-26 DIAGNOSIS — Z1159 Encounter for screening for other viral diseases: Secondary | ICD-10-CM

## 2013-01-26 DIAGNOSIS — E785 Hyperlipidemia, unspecified: Secondary | ICD-10-CM

## 2013-01-26 DIAGNOSIS — I10 Essential (primary) hypertension: Secondary | ICD-10-CM

## 2013-01-26 LAB — LIPID PANEL
Cholesterol: 229 mg/dL — ABNORMAL HIGH (ref 0–200)
HDL: 71 mg/dL (ref >39)
LDL Cholesterol: 148 mg/dL — ABNORMAL HIGH (ref 0–99)
Triglycerides: 50 mg/dL (ref <150)
VLDL: 10 mg/dL (ref 0–40)

## 2013-01-26 LAB — COMPLETE METABOLIC PANEL WITH GFR
ALT: 10 U/L (ref 0–53)
AST: 15 U/L (ref 0–37)
Albumin: 4.3 g/dL (ref 3.5–5.2)
BUN: 18 mg/dL (ref 6–23)
CO2: 27 mEq/L (ref 19–32)
Calcium: 9.8 mg/dL (ref 8.4–10.5)
Chloride: 103 mEq/L (ref 96–112)
Creat: 1.05 mg/dL (ref 0.50–1.35)
GFR, Est African American: >89 mL/min
Glucose, Bld: 99 mg/dL (ref 70–99)
Potassium: 4 mEq/L (ref 3.5–5.3)
Sodium: 139 mEq/L (ref 135–145)
Total Bilirubin: 0.3 mg/dL (ref 0.3–1.2)
Total Protein: 7.5 g/dL (ref 6.0–8.3)

## 2013-01-26 LAB — HEPATITIS C ANTIBODY: HCV Ab: NEGATIVE

## 2013-01-26 MED ORDER — PANTOPRAZOLE SODIUM 40 MG PO TBEC: 40.0000 mg | DELAYED_RELEASE_TABLET | Freq: Every day | ORAL | Status: DC

## 2013-01-26 MED ORDER — LISINOPRIL-HYDROCHLOROTHIAZIDE 20-12.5 MG PO TABS: 1.0000 | ORAL_TABLET | Freq: Every day | ORAL | Status: DC

## 2013-01-26 MED ORDER — AMLODIPINE BESYLATE 5 MG PO TABS: 5.0000 mg | ORAL_TABLET | Freq: Every day | ORAL | Status: DC

## 2013-01-26 NOTE — Progress Notes (Signed)
S:  This 57 y.o. AA male has well controlled HTN and lipid disorder. He is compliant w/ medications w/o adverse effects. 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 Pt denies fatigue, appetite change, weight loss, vision disturbances, CP or tightness, edema, SOB or DOE, cough, abd pain, HA, dizziness, numbness, weakness, tremor or syncope. He does reports infrequent palpitations.  Pt has improved his lifestyle practices and continues medication. He has no myalgias, back pain or arthralgias. GERD persists despite smoking cessation > 1 year ago.   Reviewed Nov 2013 lipid panel: TC= 214   TGs= 49   HDL= 72   LDL= 132   TC/HDL ratio= 3.0   Patient Active Problem List   Diagnosis Date Noted  . Personal history of tobacco use, presenting hazards to health 05/20/2012  . Peripheral vascular disease with claudication   . Hyperlipidemia   . Aortic regurgitation   . GERD (gastroesophageal reflux disease)   . Hypertension    PMHx: Inpatient eval in Nov 2013-  2-D ECHO Grade 2 Diastolic heart failure (EF 55-60%) and LVH; Myoview stress test ( no signs of ischemia).  Medications reconciled.  ROS: AS per HPI.  O:  Filed Vitals:   01/26/13 1141  BP: 110/66  Pulse: 64  Temp: 98.3 F (36.8 C)  Resp: 16   GEN: In NAD: WN,WD. HEENT: [place]/AT; EOMI w/ clear conj/sclerae. Otherwise unremarkable. COR: RRR. Normal S1 and S2.  LUNGS: CTA; normal resp rate and effort. MS: MAEs; no c/c/e. NEURO: A&O x 3; CNs intact. Nonfocal.   A/P: Hypertension - Stable and well controlled; continue current medications. Plan: COMPLETE METABOLIC PANEL WITH GFR  Hyperlipidemia - Well controlled w/ favorable Coronary Heart Disease Risk; continue current fish oil supplements.    Plan: Lipid panel  GERD (gastroesophageal reflux disease) - Plan: pantoprazole (PROTONIX) 40 MG tablet  Need for hepatitis C screening test - Plan: Hepatitis C antibody  Meds ordered this encounter  Medications  . amLODipine  (NORVASC) 5 MG tablet    Sig: Take 1 tablet (5 mg total) by mouth daily.    Dispense:  90 tablet    Refill:  3  . lisinopril-hydrochlorothiazide (PRINZIDE,ZESTORETIC) 20-12.5 MG per tablet    Sig: Take 1 tablet by mouth daily.    Dispense:  90 tablet    Refill:  3  . pantoprazole (PROTONIX) 40 MG tablet    Sig: Take 1 tablet (40 mg total) by mouth daily at 12 noon.    Dispense:  90 tablet    Refill:  3

## 2013-01-26 NOTE — Patient Instructions (Addendum)
Continue all current prescription medications and you will return in 6 months for CPE. Take care and enjoy the Holidays; stay healthy in the New Year!

## 2013-01-29 ENCOUNTER — Encounter: Payer: Self-pay | Admitting: Family Medicine

## 2013-07-28 ENCOUNTER — Encounter: Payer: Self-pay | Admitting: Family Medicine

## 2013-07-28 ENCOUNTER — Ambulatory Visit (INDEPENDENT_AMBULATORY_CARE_PROVIDER_SITE_OTHER): Payer: BC Managed Care – PPO | Admitting: Family Medicine

## 2013-07-28 ENCOUNTER — Telehealth: Payer: Self-pay | Admitting: Family Medicine

## 2013-07-28 VITALS — BP 100/52 | HR 53 | Temp 98.4°F | Resp 16 | Ht 68.25 in | Wt 156.0 lb

## 2013-07-28 DIAGNOSIS — I10 Essential (primary) hypertension: Secondary | ICD-10-CM

## 2013-07-28 DIAGNOSIS — I493 Ventricular premature depolarization: Secondary | ICD-10-CM

## 2013-07-28 DIAGNOSIS — E785 Hyperlipidemia, unspecified: Secondary | ICD-10-CM

## 2013-07-28 DIAGNOSIS — Z0000 Encounter for general adult medical examination without abnormal findings: Secondary | ICD-10-CM

## 2013-07-28 DIAGNOSIS — I4949 Other premature depolarization: Secondary | ICD-10-CM

## 2013-07-28 LAB — BASIC METABOLIC PANEL WITH GFR
BUN: 24 mg/dL — ABNORMAL HIGH (ref 6–23)
CALCIUM: 9.1 mg/dL (ref 8.4–10.5)
CHLORIDE: 104 meq/L (ref 96–112)
CO2: 24 meq/L (ref 19–32)
CREATININE: 1.09 mg/dL (ref 0.50–1.35)
GFR, Est African American: 86 mL/min
GFR, Est Non African American: 74 mL/min
GLUCOSE: 97 mg/dL (ref 70–99)
Potassium: 3.6 mEq/L (ref 3.5–5.3)
Sodium: 139 mEq/L (ref 135–145)

## 2013-07-28 LAB — POCT URINALYSIS DIPSTICK
Bilirubin, UA: NEGATIVE
Glucose, UA: NEGATIVE
KETONES UA: NEGATIVE
LEUKOCYTES UA: NEGATIVE
Nitrite, UA: NEGATIVE
PH UA: 5.5
Protein, UA: NEGATIVE
Spec Grav, UA: 1.02
Urobilinogen, UA: 0.2

## 2013-07-28 LAB — LIPID PANEL
CHOL/HDL RATIO: 3.3 ratio
CHOLESTEROL: 228 mg/dL — AB (ref 0–200)
HDL: 70 mg/dL (ref >39)
LDL CALC: 149 mg/dL — AB (ref 0–99)
Triglycerides: 45 mg/dL (ref <150)
VLDL: 9 mg/dL (ref 0–40)

## 2013-07-28 NOTE — Patient Instructions (Addendum)
Keeping you healthy  Get these tests  Blood pressure- Have your blood pressure checked once a year by your healthcare provider.  Normal blood pressure is 120/80  Weight- Have your body mass index (BMI) calculated to screen for obesity.  BMI is a measure of body fat based on height and weight. You can also calculate your own BMI at [email or web address]/.  Cholesterol- Have your cholesterol checked every year.  Diabetes- Have your blood sugar checked regularly if you have high blood pressure, high cholesterol, have a family history of diabetes or if you are overweight.  Screening for Colon Cancer- Colonoscopy starting at age 58.  Screening may begin sooner depending on your family history and other health conditions. Follow up colonoscopy as directed by your Gastroenterologist.  Screening for Prostate Cancer- Both blood work (PSA) and a rectal exam help screen for Prostate Cancer.  Screening begins at age 58 with African-American men and at age 58 with Caucasian men.  Screening may begin sooner depending on your family history.  Take these medicines  Aspirin- One aspirin daily can help prevent Heart disease and Stroke.  Flu shot- Every fall.  Tetanus- Every 10 years.  Zostavax- Once after the age of 58 to prevent Shingles.  Pneumonia shot- Once after the age of 58; if you are younger than 5865, ask your healthcare provider if you need a Pneumonia shot.  Take these steps  Don't smoke- If you do smoke, talk to your doctor about quitting.  For tips on how to quit, go to www.smokefree.gov or call 1-800-QUIT-NOW.  Be physically active- Exercise 5 days a week for at least 30 minutes.  If you are not already physically active start slow and gradually work up to 30 minutes of moderate physical activity.  Examples of moderate activity include walking briskly, mowing the yard, dancing, swimming, bicycling, etc.  Eat a healthy diet- Eat a variety of healthy food such as fruits, vegetables, low  fat milk, low fat cheese, yogurt, lean meant, poultry, fish, beans, tofu, etc. For more information go to www.thenutritionsource.org  Drink alcohol in moderation- Limit alcohol intake to less than two drinks a day. Never drink and drive.  Dentist- Brush and floss twice daily; visit your dentist twice a year.  Depression- Your emotional health is as important as your physical health. If you're feeling down, or losing interest in things you would normally enjoy please talk to your healthcare provider.  Eye exam- Visit your eye doctor every year.  Safe sex- If you may be exposed to a sexually transmitted infection, use a condom.  Seat belts- Seat belts can save your life; always wear one.  Smoke/Carbon Monoxide detectors- These detectors need to be installed on the appropriate level of your home.  Replace batteries at least once a year.  Skin cancer- When out in the sun, cover up and use sunscreen 15 SPF or higher.  Violence- If anyone is threatening you, please tell your healthcare provider.  Living Will/ Health care power of attorney- Speak with your healthcare provider and family.   Heart Disease Prevention Heart disease can lead to heart attacks and strokes. This is a leading cause of death. Heart disease can be inherited and can be caused by the lifestyle you lead. You can do a lot to keep your heart and blood vessels healthy and to greatly reduce your risk of having a heart attack or stroke. WHAT ARE THE WARNING SIGNS OF A HEART ATTACK? See your caregiver if you experience any of the  following warning signs. You may experience one or more of the following:  Chest pain or discomfort.  Pain or discomfort in your arms, back, jaw, or neck.  Indigestion or stomach pain.  Shortness of breath.  Sweating.  Nausea or vomiting.  Lightheadedness.  You may have no warning signs at all, or they may come and go. WHAT SHOULD I DO TO KEEP MY HEART HEALTHY?  Do not smoke. If you smoke,  quit. Your caregiver can help you with quitting options.  Check and control your blood pressure. Lowering high blood pressure may reduce the risk of heart attack or stroke.  Control your diabetes. If you have diabetes, you should have a caregiver manage your care. You should monitor your blood sugar carefully in order to reduce other health problems linked to diabetes.  Know your family history of heart disease, and share this information with your caregiver. You are more likely to get heart disease if someone in your family has it.  Follow a healthy eating plan as recommended by your caregiver or dietician. Make sure that the foods you eat are "heart-healthy":  Include foods high in fiber, such as oat bran, oatmeal, whole-grain breads and cereals.  Cut back on fried foods and foods high in saturated fat. This includes foods such as meats, butter, whole dairy products, shortening, and coconut or palm oil. Use canola and olive oil instead.  Avoid salty foods, such as canned food, luncheon meat, salty snacks, and fast food.  Eat more fruits and vegetables. When eating dairy products, make sure they are low-fat.  Know your cholesterol levels and keep cholesterol under control. Cholesterol is a substance that is made by the body and is used for many important functions. It is also found in food that comes from animals. When your cholesterol is high, it can stick to the insides of your blood vessels, making them narrow and even clogged. This problem is called atherosclerosis, and it can cause chest pain and heart attacks. Diet and medicine designed to lower cholesterol may reduce your risk of heart attack.  Have your cholesterol checked at least once a year. You should know your total cholesterol, LDL cholesterol, HDL cholesterol, and triglyceride levels. Your caregiver will know your ideal levels and how to get you there. Target cholesterol levels for most people are:  Total blood cholesterol  level: Below 200.  LDL (bad) cholesterol: Below 100.  HDL (good) cholesterol: Above 40 in men and above 50 in women.  Triglycerides (another type of fat in the blood): Below 150.  Make physical activity a part of your daily routine. Be active for a total of 30 minutes most days. Ask your caregiver what activities are best for you.  Maintain a healthy weight, and lose weight, if recommended by your caregiver.  Limit the amount of alcohol you drink.  Reduce stress.  Ask your caregiver whether you should take a daily aspirin. Studies have shown that taking aspirin can help reduce your risk of heart disease and stroke.  Take your prescribed medicines as directed.  Involve family and friends to help you with a healthy lifestyle. Document Released: 03/05/2004 Document Revised: 07/28/2011 Document Reviewed: 12/14/2008 [facility] Patient Information 2015 [place], [place]. This information is not intended to replace advice given to you by your health care provider. Make sure you discuss any questions you have with your health care provider.    Premature Ventricular Contraction Premature ventricular contraction (PVC) is an irregularity of the heart rhythm involving extra or  skipped heartbeats. In some cases, they may occur without obvious cause or heart disease. Other times, they can be caused by an electrolyte change in the blood. These need to be corrected. They can also be seen when there is not enough oxygen going to the heart. A common cause of this is plaque or cholesterol buildup. This buildup decreases the blood supply to the heart. In addition, extra beats may be caused or aggravated by:  Excessive smoking.  Alcohol consumption.  Caffeine.  Certain medications  Some street drugs. SYMPTOMS   The sensation of feeling your heart skipping a beat (palpitations).  In many cases, the person may have no symptoms. SIGNS AND TESTS   A physical examination may show an occasional  irregularity, but if the PVC beats do not happen often, they may not be found on physical exam.  Blood pressure is usually normal.  Other tests that may find extra beats of the heart are:  An EKG (electrocardiogram)  A Holter monitor which can monitor your heart over longer periods of time  An Angiogram (study of the heart arteries). TREATMENT  Usually extra heartbeats do not need treatment. The condition is treated only if symptoms are severe or if extra beats are very frequent or are causing problems. An underlying cause, if discovered, may also require treatment.  Treatment may also be needed if there may be a risk for other more serious cardiac arrhythmias.  PREVENTION   Moderation in caffeine, alcohol, and tobacco use may reduce the risk of ectopic heartbeats in some people.  Exercise often helps people who lead a sedentary (inactive) lifestyle. PROGNOSIS  PVC heartbeats are generally harmless and do not need treatment.  RISKS AND COMPLICATIONS   Ventricular tachycardia (occasionally).  There usually are no complications.  Other arrhythmias (occasionally). SEEK IMMEDIATE MEDICAL CARE IF:   You feel palpitations that are frequent or continual.  You develop chest pain or other problems such as shortness of breath, sweating, or nausea and vomiting.  You become light-headed or faint (pass out).  You get worse or do not improve with treatment. Document Released: 09/13/2003 Document Revised: 04/20/2011 Document Reviewed: 03/25/2007 [facility] Patient Information 2015 [place], [place]. This information is not intended to replace advice given to you by your health care provider. Make sure you discuss any questions you have with your health care provider.    YOU HAVE AN APPOINTMENT  WITH DR. TILLEY ON Monday AT 11:30; BE THERE AT 11:00 AM WITH ALL MEDICATIONS.

## 2013-07-28 NOTE — Progress Notes (Signed)
Subjective:    Patient ID: Albert CouncilFloyd Zill Jr., male    DOB: 1955-08-19, 58 y.o.   MRN: [id number]  HPI  This 58 y.o. AA male is here for CPE; he has HTN, hyperlipidemia, PVD and aortic valve disease (aortic regurg). Pt seen by Dr. Donnie Ahoilley in July 2014 with scheduled follow-up in January 2015; pt states he attempted to speak w/ cardiology office about follow-up but was unsuccessful. @D  ECHO w/o contrast in Nov 2013 showed moderate to severe aortic regurgitation, mild mitral regurg and EF= 55% and abnormal relaxation and grade 2 diastolic dysfunction.  Patient Active Problem List   Diagnosis Date Noted  . Personal history of tobacco use, presenting hazards to health 05/20/2012  . Peripheral vascular disease with claudication   . Hyperlipidemia   . Aortic regurgitation   . GERD (gastroesophageal reflux disease)   . Hypertension     Prior to Admission medications   Medication Sig Start Date End Date Taking? Authorizing Provider  acetaminophen (TYLENOL) 325 MG tablet Take 2 tablets (650 mg total) by mouth every 6 (six) hours as needed for pain (or Fever >/= 101). 12/22/11  Yes Vassie Lollarlos Madera, MD  amLODipine (NORVASC) 5 MG tablet Take 1 tablet (5 mg total) by mouth daily. 01/26/13  Yes Maurice MarchBarbara B Mekayla Soman, MD  aspirin 81 MG tablet Take 81 mg by mouth daily.   Yes Historical Provider, MD  Cholecalciferol (VITAMIN D3) 1000 UNITS CAPS Take 1 capsule by mouth daily.   Yes Historical Provider, MD  Coenzyme Q10 (CO Q 10 PO) Take 1 tablet by mouth daily.   Yes Historical Provider, MD  Cyanocobalamin (VITAMIN B-12 PO) 1 tablet daily.   Yes Historical Provider, MD  KRILL OIL PO Take 1 tablet by mouth daily.   Yes Historical Provider, MD  lisinopril-hydrochlorothiazide (PRINZIDE,ZESTORETIC) 20-12.5 MG per tablet Take 1 tablet by mouth daily. 01/26/13  Yes Maurice MarchBarbara B Adithi Gammon, MD  MAGNESIUM PO Take 1 tablet by mouth daily.   Yes Historical Provider, MD  Multiple Vitamins-Minerals (CENTRUM SILVER PO) Take 1  tablet by mouth daily.   Yes Historical Provider, MD  Multiple Vitamins-Minerals (ZINC PO) Take 1 tablet by mouth daily.   Yes Historical Provider, MD  pantoprazole (PROTONIX) 40 MG tablet Take 1 tablet (40 mg total) by mouth daily at 12 noon. 01/26/13  Yes Maurice MarchBarbara B Mila Pair, MD   PMHx, Surg Hx, Soc and Fam Hx reviewed.   Review of Systems  Constitutional: Positive for diaphoresis and activity change.       Pt works [employer]; he has been "pacing" himself due to episodes of CP.  HENT: Negative.   Eyes: Negative.        Annual vision evaluation, wears corrective lenses.  Respiratory: Positive for chest tightness. Negative for cough and shortness of breath.   Cardiovascular: Positive for chest pain and palpitations. Negative for leg swelling.       Pt describes chest "pressure" lasting < 5 minutes, onset w/ activity and relieved w/ rest. This occurs 2-3 times per week, +/- palpitations. Not associated with SOB, lightheadedness or n/v.  Gastrointestinal: Negative.   Endocrine: Negative.   Genitourinary: Negative.        Pt reports some nocturia when he drinks a lot of fluids in the evening.  Musculoskeletal: Negative.   Skin: Negative.   Allergic/Immunologic: Negative.   Neurological: Negative.   Hematological: Negative.   Psychiatric/Behavioral: Negative.       Objective:   Physical Exam  Nursing note and vitals reviewed.  Constitutional: He is oriented to person, place, and time. Vital signs are normal. He appears well-developed and well-nourished. No distress.  HENT:  Head: Normocephalic and atraumatic.  Right Ear: Hearing, external ear and ear canal normal.  Left Ear: Hearing, external ear and ear canal normal.  Nose: Nose normal. No nasal deformity or septal deviation.  Mouth/Throat: Uvula is midline, oropharynx is clear and moist and mucous membranes are normal. No oral lesions.  Excess cerumen in canals.  Eyes: Conjunctivae, EOM and lids are normal. Pupils are equal,  round, and reactive to light. No scleral icterus.  Fundoscopic exam:      The right eye shows no papilledema. The right eye shows red reflex.       The left eye shows no papilledema. The left eye shows red reflex.  Neck: Trachea normal, normal range of motion and full passive range of motion without pain. Neck supple. No hepatojugular reflux and no JVD present. No spinous process tenderness and no muscular tenderness present. Carotid bruit is not present. No mass and no thyromegaly present.  Cardiovascular: Regular rhythm, S1 normal and S2 normal.   No extrasystoles are present. Bradycardia present.  Exam reveals no S4 and no friction rub.   Murmur heard.  Systolic murmur is present with a grade of 3/6   Diastolic murmur is present with a grade of 6/6  Pulses:      Radial pulses are 1+ on the right side, and 1+ on the left side.       Femoral pulses are 2+ on the right side, and 2+ on the left side.      Popliteal pulses are 1+ on the right side, and 1+ on the left side.       Dorsalis pedis pulses are 1+ on the right side, and 1+ on the left side.  Pulmonary/Chest: Effort normal and breath sounds normal. No respiratory distress. He has no decreased breath sounds. He has no rales.  Abdominal: Soft. Normal appearance and bowel sounds are normal. He exhibits no distension, no pulsatile midline mass and no mass. There is no hepatosplenomegaly. There is no tenderness. There is no guarding and no CVA tenderness. No hernia.  Genitourinary:  Deferred.  Musculoskeletal:       Cervical back: Normal.       Thoracic back: Normal.       Lumbar back: Normal.  Remainder of exam unremarkable.  Lymphadenopathy:       Head (right side): No submental, no submandibular, no tonsillar, no preauricular, no posterior auricular and no occipital adenopathy present.       Head (left side): No submental, no submandibular, no tonsillar, no preauricular, no posterior auricular and no occipital adenopathy present.    He  has no cervical adenopathy.       Right: No inguinal and no supraclavicular adenopathy present.       Left: No inguinal and no supraclavicular adenopathy present.  Neurological: He is alert and oriented to person, place, and time. He has normal strength. He displays no atrophy and no tremor. No cranial nerve deficit or sensory deficit. He exhibits normal muscle tone. He displays a negative Romberg sign. Coordination and gait normal.  Reflex Scores:      Tricep reflexes are 2+ on the right side and 2+ on the left side.      Bicep reflexes are 2+ on the right side and 2+ on the left side.      Brachioradialis reflexes are 2+ on the right  side and 2+ on the left side.      Patellar reflexes are 2+ on the right side and 2+ on the left side. Skin: Skin is warm, dry and intact. No ecchymosis, no lesion and no rash noted. He is not diaphoretic. No cyanosis or erythema.  Psychiatric: His speech is normal and behavior is normal. Judgment and thought content normal. His mood appears anxious. His affect is not blunt, not labile and not inappropriate. Cognition and memory are normal. He does not exhibit a depressed mood.    ECG: Sinus rhythm w/ bigeminal PVCs and prolonged QT interval. ("Possible anterior infarct" is an old finding).     Assessment & Plan:  Routine general medical examination at a health care facility - Plan: POCT urinalysis dipstick, BASIC METABOLIC PANEL WITH GFR, PSA, EKG 12-Lead  Essential hypertension - Stable on current medications  Plan: BASIC METABOLIC PANEL WITH GFR  Hyperlipidemia - Plan: Lipid panel  Premature ventricular contractions (PVCs) (VPCs)- Contacted Dr. York Spaniel office with new finding; pt scheduled for Monday, 07/31/2013 at 11:30 AM, for evaluation.

## 2013-07-28 NOTE — Telephone Encounter (Signed)
Faxed over today's note, ekg and medication list to Dr.  Tawana Scaleilleys office

## 2013-07-29 LAB — PSA: PSA: 1.83 ng/mL (ref <=4.00)

## 2013-07-30 ENCOUNTER — Encounter: Payer: Self-pay | Admitting: Family Medicine

## 2013-07-30 ENCOUNTER — Other Ambulatory Visit: Payer: Self-pay | Admitting: Family Medicine

## 2013-07-30 MED ORDER — ROSUVASTATIN CALCIUM 5 MG PO TABS: ORAL_TABLET | ORAL | Status: DC

## 2013-07-31 ENCOUNTER — Encounter: Payer: Self-pay | Admitting: Cardiology

## 2013-08-01 ENCOUNTER — Telehealth: Payer: Self-pay

## 2013-08-01 NOTE — Telephone Encounter (Signed)
Pt called because he received a medicine at the pharm and didn't know why. Explained to him what Dr. Audria NineMcPherson wrote in the patient email. Pt will pick up the Crestor and start it right away.

## 2013-08-02 ENCOUNTER — Other Ambulatory Visit: Payer: Self-pay | Admitting: *Deleted

## 2013-08-02 DIAGNOSIS — I739 Peripheral vascular disease, unspecified: Secondary | ICD-10-CM

## 2013-08-18 NOTE — Progress Notes (Signed)
Patient ID: Albert CouncilFloyd Lucus Jr., male   DOB: August 13, 1955, 58 y.o.   MRN: [id number]   Albert Hardy, Jguadalupe    Date of visit:  07/31/2013 DOB:  0July 04, 1957    Age:  58 yrs. Medical record number:  [id number]     Account number:  [id number] Primary Care Provider: Dow AdolphMCPHERSON, BARBARA ____________________________ CURRENT DIAGNOSES  1. Aortic Valve-Regurgitation  2. Peripheral Vascular Disease  3. Hypertension,Essential (Benign)  4. GERD  5. Hyperlipidemia ____________________________ ALLERGIES  Atorvastatin, Muscle aches  Pravastatin, Muscle aches ____________________________ MEDICATIONS  1. amlodipine 5 mg tablet, 1 p.o. daily  2. aspirin 81 mg tablet,chewable, 1 p.o. daily  3. Vitamin D3 1,000 unit tablet, 1 p.o. daily  4. CoQ-10 100 mg capsule, 1 p.o. daily  5. Vitamin B-12 500 mcg tablet, 1 p.o. daily  6. Fish Oil 1,000 mg capsule, 1 p.o. daily  7. magnesium 200 mg tablet, 1 p.o. daily  8. multivitamin tablet, 1 p.o. daily  9. pantoprazole 40 mg tablet,delayed release (DR/EC), 1 p.o. daily  10. lisinopril 20 mg-hydrochlorothiazide 12.5 mg tablet, 2 p.o. daily ____________________________ CHIEF COMPLAINTS  Followup of Aortic Valve-Regurgitation  Followup of Hypertension,Essential (Benign)  Followup of Peripheral Vascular Disease ____________________________ HISTORY OF PRESENT ILLNESS Patient seen for cardiac followup. He missed his 6 month appointment when he forgot the appointment and then did not call back to the office after missing his call from us. He continues to have claudication. The claudication initially was limiting to the point of stopping after one game of bowling but he has been walking on the treadmill and he can now walk about 3-4 minutes before he has to stop. His ABIs were around 0.5 previously. He has never seen a vascular surgeon. He has been intolerant to 2 of the statin medications. He saw his primary doctor recently and has complained of episodic palpitations since then. He was  told he had an abnormal EKG. He doesn't really have angina but is mainly limited because of claudication. It is very hard to tell how much dyspnea he has. ____________________________ PAST HISTORY  Past Medical Illnesses:  hypertension, hyperlipidemia;  Cardiovascular Illnesses:  aortic insufficiency, peripheral vascular disease;  Surgical Procedures:  no prior surgical procedures;  Cardiology Procedures-Invasive:  no history of prior cardiac procedures;  Cardiology Procedures-Noninvasive:  echocardiogram November 2013, lexiscan cardiolite November 2013;  Peripheral Vascular Procedures:  peripheral doppler December 2013;  LVEF of 55% documented via echocardiogram on 12/27/2011,   ____________________________ CARDIO-PULMONARY TEST DATES EKG Date:  07/31/2013;  Nuclear Study Date:  12/28/2011;  Echocardiography Date: 12/27/2011;   ____________________________ FAMILY HISTORY Brother -- Sister alive with problem, Hypertension Brother -- Brother alive and well Mother -- Mother alive with problem, Hypertension Sister -- Sister alive and well Sister -- Sister alive with problem, Hypertension Sister -- Sister alive with problem, Diabetes mellitus Father -- Father dead, Heart Attack ____________________________ SOCIAL HISTORY Alcohol Use:  6 pack q week;  Smoking:  used to smoke but quit 1-2 ppd 2013;  Diet:  low sodium (less than 2 grams);  Lifestyle:  married;  Exercise:  no regular exercise;  Occupation:  Geophysical data processorauto detailer;  Residence:  lives with wife;   ____________________________ REVIEW OF SYSTEMS General:  denies recent weight change, fatique or change in exercise tolerance. Eyes: wears eye glasses/contact lenses, denies diplopia, glaucoma or visual field defects. Respiratory: denies dyspnea, cough, wheezing or hemoptysis. Cardiovascular:  please review HPI  Genitourinary-Male: hesitancy  Musculoskeletal:  chronic low back pain, claudication  ____________________________ PHYSICAL EXAMINATION VITAL  SIGNS  Blood Pressure:  158/70 Sitting, Right arm, regular cuff  , 162/70 Standing, Right arm and regular cuff   Pulse:  56/min. Weight:  160.00 lbs. Height:  68.5"BMI: 24  Constitutional:  pleasant African Americian male in no acute distress Skin:  warm and dry to touch, no apparent skin lesions, or masses noted. Head:  normocephalic, normal hair pattern, no masses or tenderness Chest:  normal symmetry, clear to auscultation. Cardiac:  regular rhythm, normal S1 and S2, No S3 or S4, 2/6 systolic murmur at aortic area  radiating to the neck and 2/6 blowing diastolic murmur at left sternal border Peripheral Pulses:  femoral pulses bounding with bruits, distal pulses diminished ____________________________ IMPRESSIONS/PLAN 1. Moderate to severe regurgitation 2. Hypertension currently not well controlled 3. Hyperlipidemia with statin intolerance 4. Peripheral vascular disease with claudication  Recommendations:  Obtain echocardiogram to evaluate his chamber dimensions and his moderate to severe aortic regurgitation.Marland Kitchen His blood pressure was elevated today and I recommended that he increase his lisinopril/HCTZ twice daily. I would like him to have a vascular surgery appointment as well as a repeat set of ABIs. ____________________________ TODAYS ORDERS  1. 12 Lead EKG: Today  2. 2D, color flow, doppler: First Available  3. Vasc Surgery Consult: Schedule ASAP  4. L.E. Arterial Doppler Resting: First Available  5. Return Visit: 6 months                       ____________________________ Cardiology Physician:  Darden Palmer MD [facility]

## 2013-09-07 ENCOUNTER — Encounter: Payer: Self-pay | Admitting: Vascular Surgery

## 2013-09-08 ENCOUNTER — Ambulatory Visit (HOSPITAL_COMMUNITY)
Admission: RE | Admit: 2013-09-08 | Discharge: 2013-09-08 | Disposition: A | Discharge status: Home / Self Care | Payer: BC Managed Care – PPO | Source: Ambulatory Visit | Attending: Vascular Surgery | Admitting: Vascular Surgery

## 2013-09-08 ENCOUNTER — Encounter: Payer: Self-pay | Admitting: Vascular Surgery

## 2013-09-08 ENCOUNTER — Ambulatory Visit (INDEPENDENT_AMBULATORY_CARE_PROVIDER_SITE_OTHER): Payer: BC Managed Care – PPO | Admitting: Vascular Surgery

## 2013-09-08 ENCOUNTER — Ambulatory Visit (INDEPENDENT_AMBULATORY_CARE_PROVIDER_SITE_OTHER)
Admission: RE | Admit: 2013-09-08 | Discharge: 2013-09-08 | Disposition: A | Discharge status: Home / Self Care | Payer: BC Managed Care – PPO | Source: Ambulatory Visit | Attending: Vascular Surgery | Admitting: Vascular Surgery

## 2013-09-08 ENCOUNTER — Other Ambulatory Visit (HOSPITAL_COMMUNITY): Payer: Self-pay | Admitting: Cardiology

## 2013-09-08 VITALS — BP 143/40 | HR 44 | Ht 68.5 in | Wt 158.6 lb

## 2013-09-08 DIAGNOSIS — I70219 Atherosclerosis of native arteries of extremities with intermittent claudication, unspecified extremity: Secondary | ICD-10-CM

## 2013-09-08 DIAGNOSIS — I739 Peripheral vascular disease, unspecified: Secondary | ICD-10-CM | POA: Insufficient documentation

## 2013-09-08 HISTORY — DX: Atherosclerosis of native arteries of extremities with intermittent claudication, unspecified extremity: I70.219

## 2013-09-08 NOTE — Progress Notes (Signed)
Referred by:  Maurice March, MD [address] [place], [place] [address]  Reason for referral: bilateral leg pain  History of Present Illness  Albert Covington. is a 58 y.o. (1955-05-18) male who presents with chief complaint: B leg pain.  Onset of symptom occurred with ambulation >15 minutes.  Pain is described as aching and cramping in calves, severity 3-6/10, and associated with ambulation.  Patient has attempted to treat this pain with rest.  The patient has no rest pain symptoms also and no leg wounds/ulcers.  Atherosclerotic risk factors include: HTN, HLD.  Past Medical History  Diagnosis Date  . Hypertension   . GERD (gastroesophageal reflux disease)   . Hyperlipidemia   . Heart murmur   . Substance abuse    Past Surgical History: None  History   Social History  . Marital Status: Married    Spouse Name: N/A    Number of Children: N/A  . Years of Education: N/A   Occupational History  . Not on file.   Social History Main Topics  . Smoking status: Former Smoker -- 1.00 packs/day for 30 years    Types: Cigarettes    Quit date: 12/21/2011  . Smokeless tobacco: Never Used  . Alcohol Use: 1.2 - 2.4 oz/week    1-2 Cans of beer, 1-2 Shots of liquor per week     Comment: Ocassionaly. 3-4 drinks  . Drug Use: 1.00 per week    Special: Marijuana  . Sexual Activity: Yes    Birth Control/ Protection: None   Other Topics Concern  . Not on file   Social History Narrative   Married. Education: [employer]. Exercise:  "Somewhat".    Family History  Problem Relation Age of Onset  . Other Brother   . Hypertension Brother   . Hyperlipidemia Brother   . Arthritis Mother   . Hypertension Mother   . Hyperlipidemia Mother   . Hypertension Sister   . Hyperlipidemia Sister   . Diabetes Sister   . Hyperlipidemia Brother     Current Outpatient Prescriptions on File Prior to Visit  Medication Sig Dispense Refill  . acetaminophen (TYLENOL) 325 MG tablet Take 2 tablets  (650 mg total) by mouth every 6 (six) hours as needed for pain (or Fever >/= 101).      Marland Kitchen amLODipine (NORVASC) 5 MG tablet Take 1 tablet (5 mg total) by mouth daily.  90 tablet  3  . aspirin 81 MG tablet Take 81 mg by mouth daily.      . Cholecalciferol (VITAMIN D3) 1000 UNITS CAPS Take 1 capsule by mouth daily.      . Coenzyme Q10 (CO Q 10 PO) Take 1 tablet by mouth daily.      . Cyanocobalamin (VITAMIN B-12 PO) 1 tablet daily.      Marland Kitchen KRILL OIL PO Take 1 tablet by mouth daily.      Marland Kitchen lisinopril-hydrochlorothiazide (PRINZIDE,ZESTORETIC) 20-12.5 MG per tablet Take 1 tablet by mouth daily.  90 tablet  3  . MAGNESIUM PO Take 1 tablet by mouth daily.      . Multiple Vitamins-Minerals (CENTRUM SILVER PO) Take 1 tablet by mouth daily.      . Multiple Vitamins-Minerals (ZINC PO) Take 1 tablet by mouth daily.      . pantoprazole (PROTONIX) 40 MG tablet Take 1 tablet (40 mg total) by mouth daily at 12 noon.  90 tablet  3  . rosuvastatin (CRESTOR) 5 MG tablet Take 1 tablet every other  night at bedtime.  30 tablet  3   No current facility-administered medications on file prior to visit.    Allergies  Allergen Reactions  . Lipitor [Atorvastatin]     myalgia  . Pravastatin     REVIEW OF SYSTEMS:  (Positives checked otherwise negative)  CARDIOVASCULAR:  [ ]  chest pain, [x]  chest pressure, [ ]  palpitations, [ ]  shortness of breath when laying flat, [ ]  shortness of breath with exertion,   [x]  pain in feet when walking, [x]  pain in feet when laying flat, [ ]  history of blood clot in veins (DVT), [ ]  history of phlebitis, [ ]  swelling in legs, [ ]  varicose veins  PULMONARY:  [ ]  productive cough, [ ]  asthma, [ ]  wheezing  NEUROLOGIC:  [x]  weakness in arms or legs, [x]  numbness in arms or legs, [ ]  difficulty speaking or slurred speech, [ ]  temporary loss of vision in one eye, [ ]  dizziness  HEMATOLOGIC:  [ ]  bleeding problems, [ ]  problems with blood clotting too easily  MUSCULOSKEL:  [ ]  joint  pain, [ ]  joint swelling  GASTROINTEST:  [ ]   Vomiting blood, [ ]   Blood in stool     GENITOURINARY:  [ ]   Burning with urination, [ ]   Blood in urine  PSYCHIATRIC:  [ ]  history of major depression  INTEGUMENTARY:  [ ]  rashes, [ ]  ulcers  CONSTITUTIONAL:  [ ]  fever, [ ]  chills  For VQI Use Only  PRE-ADM LIVING: Home  AMB STATUS: Ambulatory  CAD Sx: None  PRIOR CHF: None  STRESS TEST: [x]  No, [ ]  Normal, [ ]  + ischemia, [ ]  + MI, [ ]  Both  Physical Examination Filed Vitals:   09/08/13 1011  BP: 143/40  Pulse: 44  Height: 5' 8.5" (1.74 m)  Weight: 158 lb 9.6 oz (71.94 kg)  SpO2: 100%   Body mass index is 23.76 kg/(m^2).  General: A&O x 3, WDWN  Head: [place]/AT  Ear/Nose/Throat: Hearing grossly intact, nares w/o erythema or drainage, oropharynx w/o Erythema/Exudate  Eyes: PERRLA, EOMI  Neck: Supple, no nuchal rigidity, no palpable LAD  Pulmonary: Sym exp, good air movt, CTAB, no rales, rhonchi, & wheezing  Cardiac: RRR, Nl S1, S2, no Murmurs, rubs or gallops  Vascular: Vessel Right Left  Radial Palpable Palpable  Brachial  Palpable Palpable  Carotid Palpable, without bruit Palpable, without bruit  Aorta Not palpable N/A  Femoral Palpable Palpable  Popliteal Not palpable Not palpable  PT Not Palpable Not Palpable  DP Faintly Palpable Not Palpable   Gastrointestinal: soft, NTND, -G/R, - HSM, - masses, - CVAT B  Musculoskeletal: M/S 5/5 throughout , Extremities without ischemic changes   Neurologic: CN 2-12 intact , Pain and light touch intact in extremities , Motor exam as listed above  Psychiatric: Judgment intact, Mood & affect appropriatefor pt's clinical situation  Dermatologic: See M/S exam for extremity exam, no rashes otherwise noted  Lymph : No Cervical, Axillary, or Inguinal lymphadenopathy   Non-Invasive Vascular Imaging  ABI (Date: 09/08/2013)  R: 0.64 (0.49), DP: mono, PT: mono, TBI: 0.34  L: 0.55 (0.55), DP: mono, PT: mono, TBI:  0.12  BLE arterial duplex (09/08/2013)  Triphasic flow in B CFA, proximal SFA and PFA  B SFA occlusions  R: all tibials with monophasic flow  L: AT and PT with monophasic flow  Outside Studies/Documentation 3 pages of outside documents were reviewed including: outpatient cardiology chart.  Medical Decision Making  Albert CouncilFloyd Wilcock Jr. is a  58 y.o. male who presents with: BLE intermittent claudication.   I discussed with the patient the natural history of intermittent claudication: 75% of patients have stable or improved symptoms in a year an only 2% require amputation. Eventually 20% may require intervention in a year.  I discussed in depth with the patient the nature of atherosclerosis, and emphasized the importance of maximal medical management including strict control of blood pressure, blood glucose, and lipid levels, antiplatelet agent, obtaining regular exercise, and cessation of smoking.    The patient is aware that without maximal medical management the underlying atherosclerotic disease process will progress, limiting the benefit of any interventions.  I discussed in depth with the patient a walking plan and how to execute such.  The patient is not interested in starting Pletal. The patient is currently on a statin: Crestor. The patient is currently on an anti-platelet: ASA. The patient will be followed with q3 month ABI initially.  Thank you for allowing [place] to participate in this patient's care.  Leonides Sake, MD [facility] Office: [phone] Pager: [phone]  09/08/2013, 5:51 PM

## 2013-11-24 ENCOUNTER — Other Ambulatory Visit: Payer: Self-pay

## 2013-11-28 ENCOUNTER — Encounter: Payer: Self-pay | Admitting: Family Medicine

## 2013-11-28 ENCOUNTER — Ambulatory Visit (INDEPENDENT_AMBULATORY_CARE_PROVIDER_SITE_OTHER): Payer: BC Managed Care – PPO | Admitting: Family Medicine

## 2013-11-28 VITALS — BP 118/58 | HR 57 | Temp 98.8°F | Resp 16 | Ht 68.5 in | Wt 162.2 lb

## 2013-11-28 DIAGNOSIS — I10 Essential (primary) hypertension: Secondary | ICD-10-CM

## 2013-11-28 DIAGNOSIS — E785 Hyperlipidemia, unspecified: Secondary | ICD-10-CM

## 2013-11-28 NOTE — Progress Notes (Signed)
S:  This 58 y.o. AA male has well controlled HTN and hyperlipidemia. He is complaint w/ medications w/o adverse effects. He feels well, denying diaphoresis, CP or tightness, palpitations, SOB or DOE, cough, HA, dizziness or syncope. PAD was evaluated by Dr. Imogene Burnhen; pt has absent palpable pulses distal to femoral arteries. Condition will be monitored; pt is following advice regarding walking. He has been tobacco-free ~ 2 years.  Patient Active Problem List   Diagnosis Date Noted  . Atherosclerosis of native arteries of the extremities with intermittent claudication 09/08/2013  . Personal history of tobacco use, presenting hazards to health 05/20/2012  . Peripheral vascular disease with claudication   . Hyperlipidemia   . Aortic regurgitation   . GERD (gastroesophageal reflux disease)   . Hypertension     Prior to Admission medications   Medication Sig Start Date End Date Taking? Authorizing Provider  acetaminophen (TYLENOL) 325 MG tablet Take 2 tablets (650 mg total) by mouth every 6 (six) hours as needed for pain (or Fever >/= 101). 12/22/11  Yes Vassie Lollarlos Madera, MD  amLODipine (NORVASC) 5 MG tablet Take 1 tablet (5 mg total) by mouth daily. 01/26/13  Yes Maurice MarchBarbara B Hatsue Sime, MD  aspirin 81 MG tablet Take 81 mg by mouth daily.   Yes Historical Provider, MD  Cholecalciferol (VITAMIN D3) 1000 UNITS CAPS Take 1 capsule by mouth daily.   Yes Historical Provider, MD  Coenzyme Q10 (CO Q 10 PO) Take 1 tablet by mouth daily.   Yes Historical Provider, MD  Cyanocobalamin (VITAMIN B-12 PO) 1 tablet daily.   Yes Historical Provider, MD  KRILL OIL PO Take 1 tablet by mouth daily.   Yes Historical Provider, MD  lisinopril-hydrochlorothiazide (PRINZIDE,ZESTORETIC) 20-12.5 MG per tablet Take 1 tablet by mouth daily. 01/26/13  Yes Maurice MarchBarbara B Ifeanyichukwu Wickham, MD  MAGNESIUM PO Take 1 tablet by mouth daily.   Yes Historical Provider, MD  Multiple Vitamins-Minerals (CENTRUM SILVER PO) Take 1 tablet by mouth daily.   Yes  Historical Provider, MD  Multiple Vitamins-Minerals (ZINC PO) Take 1 tablet by mouth daily.   Yes Historical Provider, MD  pantoprazole (PROTONIX) 40 MG tablet Take 1 tablet (40 mg total) by mouth daily at 12 noon. 01/26/13  Yes Maurice MarchBarbara B Taunia Frasco, MD  rosuvastatin (CRESTOR) 5 MG tablet Take 1 tablet every other night at bedtime. 07/30/13  Yes Maurice MarchBarbara B Jamilee Lafosse, MD    ROS: As per HPI.  Ceasar Mons: Filed Vitals:   11/28/13 1333  BP: 118/58  Pulse: 57  Temp: 98.8 F (37.1 C)  Resp: 16    GEN: In NAD; WN,WD. HENT: [place]/AT. EOMI w/ clear conj/sclerae. Otherwise unremarkable. COR: RRR. LUNGS: Unlabored resp. MS: MAEs; no deformities, c/c/e. NEURO: A&O x 3; CNs intact. Nonfocal.  A/P: Essential hypertension- Stable on current medications; no changes. Continue walking program.   Hyperlipidemia- No medication side effects; continue rosuvastatin.  Pt declines Flu vaccine.

## 2013-12-15 ENCOUNTER — Encounter (HOSPITAL_COMMUNITY): Payer: BC Managed Care – PPO

## 2013-12-15 ENCOUNTER — Ambulatory Visit: Payer: BC Managed Care – PPO | Admitting: Family

## 2014-02-08 ENCOUNTER — Other Ambulatory Visit: Payer: Self-pay | Admitting: Family Medicine

## 2014-02-22 ENCOUNTER — Other Ambulatory Visit: Payer: Self-pay | Admitting: Family Medicine

## 2014-02-23 NOTE — Telephone Encounter (Signed)
Pt had check up in Oct but I don't see this med discussed. Has appt for 05/29/14. OK to RF until then?

## 2014-02-23 NOTE — Telephone Encounter (Signed)
Pantoprazole refilled until next appt.

## 2014-05-12 ENCOUNTER — Other Ambulatory Visit: Payer: Self-pay | Admitting: Physician Assistant

## 2014-05-29 ENCOUNTER — Encounter: Payer: Self-pay | Admitting: Family Medicine

## 2014-05-29 ENCOUNTER — Ambulatory Visit (INDEPENDENT_AMBULATORY_CARE_PROVIDER_SITE_OTHER): Payer: BLUE CROSS/BLUE SHIELD | Admitting: Family Medicine

## 2014-05-29 ENCOUNTER — Other Ambulatory Visit: Payer: Self-pay | Admitting: Family Medicine

## 2014-05-29 VITALS — BP 109/62 | HR 58 | Temp 98.6°F | Resp 16 | Ht 68.5 in | Wt 165.0 lb

## 2014-05-29 DIAGNOSIS — I10 Essential (primary) hypertension: Secondary | ICD-10-CM

## 2014-05-29 NOTE — Progress Notes (Signed)
S:  This 59 y.o. Male has well controlled HTN w/ aortic valve disease (AR moderate to severe) eval by Dr. Georga Hacking and PVD, eval by Dr. Leonides Sake (last visit July 2015). No future appts visible in EMR. Pt is compliant w/ medications w/o adverse medication effects. He denies diaphoresis, fatigue, abnormal weight gain, vision disturbances, CP or tightness, palpitations, edema, SOB or DOE, HA, dizziness, numbness, weakness or syncope.  Patient Active Problem List   Diagnosis Date Noted  . Atherosclerosis of native arteries of the extremities with intermittent claudication 09/08/2013  . Personal history of tobacco use, presenting hazards to health 05/20/2012  . Peripheral vascular disease with claudication   . Hyperlipidemia   . Aortic regurgitation   . GERD (gastroesophageal reflux disease)   . Hypertension     Prior to Admission medications   Medication Sig Start Date End Date Taking? Authorizing Provider  amLODipine (NORVASC) 5 MG tablet TAKE ONE TABLET BY MOUTH ONCE DAILY 05/14/14  Yes Chelle S Jeffery, PA-C  aspirin 81 MG tablet Take 81 mg by mouth daily.   Yes Historical Provider, MD  Cholecalciferol (VITAMIN D3) 1000 UNITS CAPS Take 1 capsule by mouth daily.   Yes Historical Provider, MD  Coenzyme Q10 (CO Q 10 PO) Take 1 tablet by mouth daily.   Yes Historical Provider, MD  Cyanocobalamin (VITAMIN B-12 PO) 1 tablet daily.   Yes Historical Provider, MD  KRILL OIL PO Take 1 tablet by mouth daily.   Yes Historical Provider, MD  lisinopril-hydrochlorothiazide (PRINZIDE,ZESTORETIC) 20-12.5 MG per tablet Take 1 tablet by mouth daily. Patient taking differently: Take 2 tablets by mouth daily.  01/26/13  Yes Maurice March, MD  MAGNESIUM PO Take 1 tablet by mouth daily.   Yes Historical Provider, MD  Multiple Vitamins-Minerals (CENTRUM SILVER PO) Take 1 tablet by mouth daily.   Yes Historical Provider, MD  Multiple Vitamins-Minerals (ZINC PO) Take 1 tablet by mouth daily.   Yes  Historical Provider, MD  pantoprazole (PROTONIX) 40 MG tablet TAKE ONE TABLET BY MOUTH ONCE DAILY AT 12 PM 02/23/14  Yes Maurice March, MD  rosuvastatin (CRESTOR) 5 MG tablet Take 1 tablet every other night at bedtime. Patient not taking: Reported on 05/29/2014 07/30/13   Maurice March, MD    History reviewed. No pertinent past surgical history.  History   Social History  . Marital Status: Married    Spouse Name: N/A  . Number of Children: N/A  . Years of Education: N/A   Occupational History  . Not on file.   Social History Main Topics  . Smoking status: Former Smoker -- 1.00 packs/day for 30 years    Types: Cigarettes    Quit date: 12/21/2011  . Smokeless tobacco: Never Used  . Alcohol Use: 1.2 - 2.4 oz/week    1-2 Cans of beer, 1-2 Shots of liquor per week     Comment: Ocassionaly. 3-4 drinks  . Drug Use: 1.00 per week    Special: Marijuana  . Sexual Activity: Yes    Birth Control/ Protection: None   Other Topics Concern  . Not on file   Social History Narrative   Married. Education: [employer]. Exercise:  "Somewhat".    Family History  Problem Relation Age of Onset  . Other Brother   . Hypertension Brother   . Hyperlipidemia Brother   . Arthritis Mother   . Hypertension Mother   . Hyperlipidemia Mother   . Hypertension Sister   . Hyperlipidemia Sister   .  Diabetes Sister   . Hyperlipidemia Brother     ROS: AS per HPI.   O: Filed Vitals:   05/29/14 1405  BP: 109/62  Pulse: 58  Temp: 98.6 F (37 C)  Resp: 16    GEN: In NAD; WN,WD. HENT: [place]/AT; EOMI w/ clear conj/sclerae. Otherwise unremarkable. COR: RRR. LUNGS: Normal resp rate and effort. SKIN; W&D; intact. NEURO; A&O x 3; CNs intact. Nonfocal.  A/P: Essential hypertension- Stable and well controlled on current medications.  Sch CPE w/ M. Clark, PA-C.

## 2014-05-30 NOTE — Telephone Encounter (Signed)
Dr Audria NineMcPherson, you saw pt yesterday for check up, but GERD not addressed. Can we RF?

## 2014-08-24 ENCOUNTER — Other Ambulatory Visit: Payer: Self-pay | Admitting: Physician Assistant

## 2014-08-27 ENCOUNTER — Ambulatory Visit (INDEPENDENT_AMBULATORY_CARE_PROVIDER_SITE_OTHER): Payer: BLUE CROSS/BLUE SHIELD | Admitting: Physician Assistant

## 2014-08-27 ENCOUNTER — Encounter: Payer: Self-pay | Admitting: Physician Assistant

## 2014-08-27 VITALS — BP 100/50 | HR 59 | Temp 99.2°F | Resp 16 | Ht 68.5 in | Wt 162.2 lb

## 2014-08-27 DIAGNOSIS — E785 Hyperlipidemia, unspecified: Secondary | ICD-10-CM | POA: Diagnosis not present

## 2014-08-27 DIAGNOSIS — Z131 Encounter for screening for diabetes mellitus: Secondary | ICD-10-CM | POA: Diagnosis not present

## 2014-08-27 DIAGNOSIS — Z1329 Encounter for screening for other suspected endocrine disorder: Secondary | ICD-10-CM

## 2014-08-27 DIAGNOSIS — I10 Essential (primary) hypertension: Secondary | ICD-10-CM | POA: Diagnosis not present

## 2014-08-27 DIAGNOSIS — Z114 Encounter for screening for human immunodeficiency virus [HIV]: Secondary | ICD-10-CM

## 2014-08-27 DIAGNOSIS — Z1211 Encounter for screening for malignant neoplasm of colon: Secondary | ICD-10-CM | POA: Diagnosis not present

## 2014-08-27 DIAGNOSIS — Z87891 Personal history of nicotine dependence: Secondary | ICD-10-CM | POA: Diagnosis not present

## 2014-08-27 DIAGNOSIS — Z113 Encounter for screening for infections with a predominantly sexual mode of transmission: Secondary | ICD-10-CM

## 2014-08-27 DIAGNOSIS — Z23 Encounter for immunization: Secondary | ICD-10-CM | POA: Diagnosis not present

## 2014-08-27 LAB — COMPLETE METABOLIC PANEL WITH GFR
ALT: 9 U/L (ref 0–53)
AST: 16 U/L (ref 0–37)
Albumin: 4.2 g/dL (ref 3.5–5.2)
Alkaline Phosphatase: 46 U/L (ref 39–117)
BUN: 17 mg/dL (ref 6–23)
CO2: 26 mEq/L (ref 19–32)
Calcium: 9.3 mg/dL (ref 8.4–10.5)
Chloride: 103 mEq/L (ref 96–112)
Creat: 1.07 mg/dL (ref 0.50–1.35)
GFR, Est African American: 87 mL/min
GFR, Est Non African American: 76 mL/min
Glucose, Bld: 98 mg/dL (ref 70–99)
Potassium: 4.6 mEq/L (ref 3.5–5.3)
Sodium: 141 mEq/L (ref 135–145)
Total Bilirubin: 0.4 mg/dL (ref 0.2–1.2)
Total Protein: 7.1 g/dL (ref 6.0–8.3)

## 2014-08-27 LAB — CBC
HEMATOCRIT: 36.9 % — AB (ref 39.0–52.0)
HEMOGLOBIN: 12.1 g/dL — AB (ref 13.0–17.0)
MCH: 29.1 pg (ref 26.0–34.0)
MCHC: 32.8 g/dL (ref 30.0–36.0)
MCV: 88.7 fL (ref 78.0–100.0)
MPV: 10.9 fL (ref 8.6–12.4)
PLATELETS: 239 10*3/uL (ref 150–400)
RBC: 4.16 MIL/uL — ABNORMAL LOW (ref 4.22–5.81)
RDW: 14 % (ref 11.5–15.5)
WBC: 7 10*3/uL (ref 4.0–10.5)

## 2014-08-27 LAB — POCT URINALYSIS DIPSTICK
Bilirubin, UA: NEGATIVE
Blood, UA: NEGATIVE
Glucose, UA: NEGATIVE
Ketones, UA: NEGATIVE
Leukocytes, UA: NEGATIVE
Nitrite, UA: NEGATIVE
Protein, UA: NEGATIVE
Spec Grav, UA: <=1.005
Urobilinogen, UA: 0.2
pH, UA: 5.5

## 2014-08-27 LAB — LIPID PANEL
Cholesterol: 195 mg/dL (ref 0–200)
HDL: 73 mg/dL (ref >=40)
LDL Cholesterol: 111 mg/dL — ABNORMAL HIGH (ref 0–99)
Total CHOL/HDL Ratio: 2.7 Ratio
Triglycerides: 55 mg/dL (ref <150)
VLDL: 11 mg/dL (ref 0–40)

## 2014-08-27 LAB — HEMOGLOBIN A1C
Hgb A1c MFr Bld: 6.2 % — ABNORMAL HIGH (ref <5.7)
Mean Plasma Glucose: 131 mg/dL — ABNORMAL HIGH (ref <117)

## 2014-08-27 LAB — HIV ANTIBODY (ROUTINE TESTING W REFLEX): HIV 1&2 Ab, 4th Generation: NONREACTIVE

## 2014-08-27 NOTE — Patient Instructions (Signed)
Exercise improves every system in the body.  It lowers the risk of heart disease, decreases blood pressure, reduces the symptoms of depression and anxiety, and lowers blood sugar. To receive these benefits, try to get 150 minutes of planned exercise each week.  You can break this 150 minutes up however you like.  For instance, you can perform 30 minutes of brisk walking 5 days a week, or perform 50 minutes 3 days a week.  If you don't like walking, or can't find a safe place to walk, find another way to move that you can enjoy.  Exercise tapes, cycling, stair climbing, swimming, or a combination will be just as good as a walking program. To ensure the proper intensity, you can use the talk test. Essentially, you should be able to carry on a conversation, but you should have to take short breaks from the conversation in order catch your breath.  

## 2014-08-27 NOTE — Progress Notes (Signed)
08/27/2014 at 3:52 PM  Albert Hardy. / DOB: 1955-11-12 / MRN: [id number]  The patient has Aortic regurgitation; GERD (gastroesophageal reflux disease); Hypertension; Peripheral vascular disease with claudication; Hyperlipidemia; Former moderate cigarette smoker (10-19 per day); and Atherosclerosis of native arteries of the extremities with intermittent claudication on his problem list.  SUBJECTIVE  Chief complaint: Annual Exam and Medication Refill  Patient here today for his annual physical.  He feels well and has not been smoking for the last two years.  Denies anhedonia and depression at this time. Feels some fatigue but thinks this is old age.  Is seen by cardiology every 6 months for the management of aortic regurgitation and has follow up in place. He would like a referral for colonoscopy today.   He  has a past medical history of Hypertension; GERD (gastroesophageal reflux disease); Hyperlipidemia; Heart murmur; and Substance abuse.    Medications reviewed and updated by myself where necessary, and exist elsewhere in the encounter.   Albert Hardy is allergic to lipitor and pravastatin. He  reports that he quit smoking about 2 years ago. His smoking use included Cigarettes. He has a 30 pack-year smoking history. He has never used smokeless tobacco. He reports that he drinks about 1.2 - 3.6 oz of alcohol per week. He reports that he uses illicit drugs (Marijuana) about once per week. He  reports that he currently engages in sexual activity. He reports using the following method of birth control/protection: None. The patient  has no past surgical history on file.  His family history includes Arthritis in his mother; Diabetes in his sister; Hyperlipidemia in his brother, brother, mother, and sister; Hypertension in his brother, mother, and sister; Other in his brother.  Review of Systems  Constitutional: Negative for fever and chills.  Respiratory: Negative for cough.   Cardiovascular:  Negative for chest pain.  Gastrointestinal: Negative for nausea.  Genitourinary: Negative for dysuria.  Musculoskeletal: Negative for myalgias.  Skin: Negative for itching and rash.  Neurological: Negative for dizziness and headaches.  Psychiatric/Behavioral: Negative for depression.    OBJECTIVE  His  height is 5' 8.5" (1.74 m) and weight is 162 lb 3.2 oz (73.573 kg). His oral temperature is 99.2 F (37.3 C). His blood pressure is 100/50 and his pulse is 59. His respiration is 16 and oxygen saturation is 99%.  The patient's body mass index is 24.3 kg/(m^2).  Physical Exam  Constitutional: He is oriented to person, place, and time. He appears well-developed and well-nourished. No distress.  Cardiovascular: Normal rate.   Respiratory: Effort normal and breath sounds normal. No respiratory distress. He has no wheezes. He has no rales. He exhibits no tenderness.  GI: Soft.  Neurological: He is alert and oriented to person, place, and time.  Skin: Skin is warm. He is not diaphoretic.  Psychiatric: He has a normal mood and affect. His behavior is normal. Judgment and thought content normal.   Recent Results (from the past 2160 hour(s))  Hemoglobin A1c     Status: Abnormal   Collection Time: 08/27/14  3:11 PM  Result Value Ref Range   Hgb A1c MFr Bld 6.2 (H) <5.7 %    Comment:  According to the ADA Clinical Practice Recommendations for 2011, when HbA1c is used as a screening test:     >=6.5%   Diagnostic of Diabetes Mellitus            (if abnormal result is confirmed)   5.7-6.4%   Increased risk of developing Diabetes Mellitus   References:Diagnosis and Classification of Diabetes Mellitus,Diabetes VXBL,3903,00(PQZRA 1):S62-S69 and Standards of Medical Care in         Diabetes - 2011,Diabetes QTMA,2633,35 (Suppl 1):S11-S61.      Mean Plasma Glucose 131 (H) <117 mg/dL  CBC     Status: Abnormal   Collection  Time: 08/27/14  3:11 PM  Result Value Ref Range   WBC 7.0 4.0 - 10.5 K/uL   RBC 4.16 (L) 4.22 - 5.81 MIL/uL   Hemoglobin 12.1 (L) 13.0 - 17.0 g/dL   HCT 36.9 (L) 39.0 - 52.0 %   MCV 88.7 78.0 - 100.0 fL   MCH 29.1 26.0 - 34.0 pg   MCHC 32.8 30.0 - 36.0 g/dL   RDW 14.0 11.5 - 15.5 %   Platelets 239 150 - 400 K/uL   MPV 10.9 8.6 - 12.4 fL  COMPLETE METABOLIC PANEL WITH GFR     Status: None   Collection Time: 08/27/14  3:11 PM  Result Value Ref Range   Sodium 141 135 - 145 mEq/L   Potassium 4.6 3.5 - 5.3 mEq/L   Chloride 103 96 - 112 mEq/L   CO2 26 19 - 32 mEq/L   Glucose, Bld 98 70 - 99 mg/dL   BUN 17 6 - 23 mg/dL   Creat 1.07 0.50 - 1.35 mg/dL   Total Bilirubin 0.4 0.2 - 1.2 mg/dL   Alkaline Phosphatase 46 39 - 117 U/L   AST 16 0 - 37 U/L   ALT 9 0 - 53 U/L   Total Protein 7.1 6.0 - 8.3 g/dL   Albumin 4.2 3.5 - 5.2 g/dL   Calcium 9.3 8.4 - 10.5 mg/dL   GFR, Est African American 87 mL/min   GFR, Est Non African American 76 mL/min    Comment:   The estimated GFR is a calculation valid for adults (>=66 years old) that uses the CKD-EPI algorithm to adjust for age and sex. It is   not to be used for children, pregnant women, hospitalized patients,    patients on dialysis, or with rapidly changing kidney function. According to the NKDEP, eGFR >89 is normal, 60-89 shows mild impairment, 30-59 shows moderate impairment, 15-29 shows severe impairment and <15 is ESRD.     HIV antibody     Status: None   Collection Time: 08/27/14  3:11 PM  Result Value Ref Range   HIV 1&2 Ab, 4th Generation NONREACTIVE NONREACTIVE    Comment:   HIV-1 antigen and HIV-1/HIV-2 antibodies were not detected.  There is no laboratory evidence of HIV infection.   HIV-1/2 Antibody Diff        Not indicated. HIV-1 RNA, Qual TMA          Not indicated.     PLEASE NOTE: This information has been disclosed to you from records whose confidentiality may be protected by state law. If your state requires  such protection, then the state law prohibits you from making any further disclosure of the information without the specific written consent of the person to whom it pertains, or as otherwise permitted by law. A general authorization for the release of medical or other information is NOT  sufficient for this purpose.   The performance of this assay has not been clinically validated in patients less than 39 years old.   For additional information please refer to http://education.questdiagnostics.com/faq/FAQ106.  (This link is being provided for informational/educational purposes only.)     RPR     Status: None   Collection Time: 08/27/14  3:11 PM  Result Value Ref Range   RPR Ser Ql NON REAC NON REAC  Lipid panel     Status: Abnormal   Collection Time: 08/27/14  3:11 PM  Result Value Ref Range   Cholesterol 195 0 - 200 mg/dL    Comment: ATP III Classification:       < 200        mg/dL        Desirable      200 - 239     mg/dL        Borderline High      >= 240        mg/dL        High      Triglycerides 55 <150 mg/dL   HDL 73 >=40 mg/dL   Total CHOL/HDL Ratio 2.7 Ratio   VLDL 11 0 - 40 mg/dL   LDL Cholesterol 111 (H) 0 - 99 mg/dL    Comment:   Total Cholesterol/HDL Ratio:CHD Risk                        Coronary Heart Disease Risk Table                                        Men       Women          1/2 Average Risk              3.4        3.3              Average Risk              5.0        4.4           2X Average Risk              9.6        7.1           3X Average Risk             23.4       11.0 Use the calculated Patient Ratio above and the CHD Risk table  to determine the patient's CHD Risk. ATP III Classification (LDL):       < 100        mg/dL         Optimal      100 - 129     mg/dL         Near or Above Optimal      130 - 159     mg/dL         Borderline High      160 - 189     mg/dL         High       > 190        mg/dL         Very High     POCT  urinalysis dipstick  Status: None   Collection Time: 08/27/14  4:03 PM  Result Value Ref Range   Color, UA light Yellow    Clarity, UA Clear    Glucose, UA Negative    Bilirubin, UA Negative    Ketones, UA Negative    Spec Grav, UA <=1.005    Blood, UA Negative    pH, UA 5.5    Protein, UA Negative    Urobilinogen, UA 0.2    Nitrite, UA Negative    Leukocytes, UA Negative Negative     No results found for this or any previous visit (from the past 24 hour(s)).  ASSESSMENT & PLAN  Albert Hardy was seen today for annual exam and medication refill.  Diagnoses and all orders for this visit:  Hyperlipidemia Orders: -     Lipid panel  Essential hypertension Orders: -     CBC -     COMPLETE METABOLIC PANEL WITH GFR -     POCT urinalysis dipstick  Need for prophylactic vaccination with combined diphtheria-tetanus-pertussis (DTP) vaccine Orders: -     Tdap vaccine greater than or equal to 7yo IM  Former moderate cigarette smoker (10-19 per day)  Routine screening for STI (sexually transmitted infection) Orders: -     RPR  Screening for HIV (human immunodeficiency virus) Orders: -     HIV antibody  Screening for diabetes mellitus Orders: -     Hemoglobin A1c  Special screening for malignant neoplasms, colon Orders: -     Ambulatory referral to Gastroenterology  Thyroid disorder screening Orders: -     TSH    The patient was advised to call or come back to clinic if he does not see an improvement in symptoms, or worsens with the above plan.   Philis Fendt, MHS, PA-C [facility] 08/27/2014 3:52 PM

## 2014-08-28 LAB — RPR

## 2014-08-29 ENCOUNTER — Other Ambulatory Visit: Payer: Self-pay | Admitting: Physician Assistant

## 2014-08-29 DIAGNOSIS — D649 Anemia, unspecified: Secondary | ICD-10-CM

## 2014-08-29 NOTE — Progress Notes (Signed)
Adding on peripheral smear due to finding of anemia on CBC.  Referral for colonoscopy is in.  Albert BostonMichael Zamora Colton, MS, PA-C   12:19 PM, 08/29/2014

## 2014-08-31 NOTE — Progress Notes (Signed)
Please call [facility] and add on a peripheral smear   this test can only be done within 24 hours

## 2014-10-03 ENCOUNTER — Encounter: Payer: Self-pay | Admitting: Internal Medicine

## 2014-11-11 ENCOUNTER — Other Ambulatory Visit: Payer: Self-pay | Admitting: Urgent Care

## 2014-12-04 ENCOUNTER — Telehealth: Payer: Self-pay | Admitting: *Deleted

## 2014-12-04 NOTE — Telephone Encounter (Signed)
Dr. Leone PayorGessner, This pt has aortic valve: Moderate to severe regurgitation.  I just wanted to make you aware of this.  Does this have anything to do with aortic stenosis?  Just wanted to make sure he is ok for the [facility].  Thanks, [employer] ResourcesKristen

## 2014-12-05 NOTE — Telephone Encounter (Signed)
Staff message sent - this is a leaking valve not stenosis. Do not think he is ASA IV

## 2014-12-10 ENCOUNTER — Ambulatory Visit (AMBULATORY_SURGERY_CENTER): Payer: Self-pay | Admitting: *Deleted

## 2014-12-10 VITALS — Ht 68.5 in | Wt 162.4 lb

## 2014-12-10 DIAGNOSIS — Z1211 Encounter for screening for malignant neoplasm of colon: Secondary | ICD-10-CM

## 2014-12-10 NOTE — Progress Notes (Signed)
Denies allergies to eggs or soy products. Denies complications with sedation or anesthesia. Denies O2 use. Denies use of diet or weight loss medications.  Emmi instructions given for colonoscopy.  

## 2014-12-11 ENCOUNTER — Other Ambulatory Visit: Payer: Self-pay

## 2014-12-11 MED ORDER — PANTOPRAZOLE SODIUM 40 MG PO TBEC: 40.0000 mg | DELAYED_RELEASE_TABLET | Freq: Every day | ORAL | Status: DC

## 2014-12-24 ENCOUNTER — Encounter: Payer: Self-pay | Admitting: Internal Medicine

## 2014-12-24 ENCOUNTER — Ambulatory Visit (AMBULATORY_SURGERY_CENTER): Payer: BLUE CROSS/BLUE SHIELD | Admitting: Internal Medicine

## 2014-12-24 VITALS — BP 146/74 | HR 46 | Temp 98.2°F | Resp 13 | Ht 68.0 in | Wt 162.0 lb

## 2014-12-24 DIAGNOSIS — Z1211 Encounter for screening for malignant neoplasm of colon: Secondary | ICD-10-CM | POA: Diagnosis not present

## 2014-12-24 MED ORDER — SODIUM CHLORIDE 0.9 % IV SOLN: 500.0000 mL | INTRAVENOUS | Status: DC

## 2014-12-24 NOTE — Progress Notes (Signed)
To recovery, report to Brown, RN, VSS. 

## 2014-12-24 NOTE — Patient Instructions (Addendum)
   No polyps or cancer detected! Next routine colonoscopy in 10 years - 2026      YOU HAD AN ENDOSCOPIC PROCEDURE TODAY AT THE [facility]:   Refer to the procedure report that was given to you for any specific questions about what was found during the examination.  If the procedure report does not answer your questions, please call your gastroenterologist to clarify.  If you requested that your care partner not be given the details of your procedure findings, then the procedure report has been included in a sealed envelope for you to review at your convenience later.  YOU SHOULD EXPECT: Some feelings of bloating in the abdomen. Passage of more gas than usual.  Walking can help get rid of the air that was put into your GI tract during the procedure and reduce the bloating. If you had a lower endoscopy (such as a colonoscopy or flexible sigmoidoscopy) you may notice spotting of blood in your stool or on the toilet paper. If you underwent a bowel prep for your procedure, you may not have a normal bowel movement for a few days.  Please Note:  You might notice some irritation and congestion in your nose or some drainage.  This is from the oxygen used during your procedure.  There is no need for concern and it should clear up in a day or so.  SYMPTOMS TO REPORT IMMEDIATELY:   Following lower endoscopy (colonoscopy or flexible sigmoidoscopy):  Excessive amounts of blood in the stool  Significant tenderness or worsening of abdominal pains  Swelling of the abdomen that is new, acute  Fever of 100F or higher    For urgent or emergent issues, a gastroenterologist can be reached at any hour by calling [phone].   DIET: Your first meal following the procedure should be a small meal and then it is ok to progress to your normal diet. Heavy or fried foods are harder to digest and may make you feel nauseous or bloated.  Likewise, meals heavy in dairy and vegetables can increase  bloating.  Drink plenty of fluids but you should avoid alcoholic beverages for 24 hours.  ACTIVITY:  You should plan to take it easy for the rest of today and you should NOT DRIVE or use heavy machinery until tomorrow (because of the sedation medicines used during the test).    FOLLOW UP: Our staff will call the number listed on your records the next business day following your procedure to check on you and address any questions or concerns that you may have regarding the information given to you following your procedure. If we do not reach you, we will leave a message.  However, if you are feeling well and you are not experiencing any problems, there is no need to return our call.  We will assume that you have returned to your regular daily activities without incident.  If any biopsies were taken you will be contacted by phone or by letter within the next 1-3 weeks.  Please call us at [phone] if you have not heard about the biopsies in 3 weeks.    SIGNATURES/CONFIDENTIALITY: You and/or your care partner have signed paperwork which will be entered into your electronic medical record.  These signatures attest to the fact that that the information above on your After Visit Summary has been reviewed and is understood.  Full responsibility of the confidentiality of this discharge information lies with you and/or your care-partner.   Resume medications.

## 2014-12-24 NOTE — Op Note (Signed)
[facility] [address] [place], [id number]   COLONOSCOPY PROCEDURE REPORT  PATIENT: Albert Hardy, Albert Hardy  MR#: [id number] BIRTHDATE: 23-Sep-1955 , 59  yrs. old GENDER: male ENDOSCOPIST: Iva Booparl E Gessner, MD, [facility] PROCEDURE DATE:  12/24/2014 PROCEDURE:   Colonoscopy, screening First Screening Colonoscopy - Avg.  risk and is 50 yrs.  old or older Yes.  Prior Negative Screening - Now for repeat screening. N/A  History of Adenoma - Now for follow-up colonoscopy & has been > or = to 3 yrs.  N/A  Polyps removed today? No Recommend repeat exam, <10 yrs? No ASA CLASS:   Class II INDICATIONS:Screening for colonic neoplasia and Colorectal Neoplasm Risk Assessment for this procedure is average risk. MEDICATIONS: Propofol 200 mg IV and Monitored anesthesia care  DESCRIPTION OF PROCEDURE:   After the risks benefits and alternatives of the procedure were thoroughly explained, informed consent was obtained.  The digital rectal exam revealed no abnormalities of the rectum, revealed no prostatic nodules, and revealed the prostate was not enlarged.   The LB [id number] endoscope was introduced through the anus and advanced to the cecum, which was identified by both the appendix and ileocecal valve. No adverse events experienced.   The quality of the prep was excellent.  (MiraLax was used)  The instrument was then slowly withdrawn as the colon was fully examined. Estimated blood loss is zero unless otherwise noted in this procedure report.      COLON FINDINGS: A normal appearing cecum, ileocecal valve, and appendiceal orifice were identified.  The ascending, transverse, descending, sigmoid colon, and rectum appeared unremarkable. Right colon retroflexion included.  Retroflexed views revealed no abnormalities. The time to cecum = 2.1 Withdrawal time = 9.9   The scope was withdrawn and the procedure completed. COMPLICATIONS: There were no immediate complications.  ENDOSCOPIC  IMPRESSION: Normal colonoscopy - excellent prep - first screening  RECOMMENDATIONS: Repeat colonoscopy/screening test 10 years (2026).  mother w/ Hx colon cancer but dx in 70's so he is routine/average risk  eSigned:  Iva Booparl E Gessner, MD, [facility] 12/24/2014 10:13 AM   cc: Deliah BostonMichael Clark, PA-C and The Patient

## 2014-12-25 ENCOUNTER — Telehealth: Payer: Self-pay | Admitting: *Deleted

## 2014-12-25 NOTE — Telephone Encounter (Signed)
  Follow up Call-  Call back number 12/24/2014  Post procedure Call Back phone  # [phone]  Permission to leave phone message Yes     Patient questions:  Do you have a fever, pain , or abdominal swelling? No. Pain Score  0 *  Have you tolerated food without any problems? Yes.    Have you been able to return to your normal activities? Yes.    Do you have any questions about your discharge instructions: Diet   No. Medications  No. Follow up visit  No.  Do you have questions or concerns about your Care? No.  Actions: * If pain score is 4 or above: No action needed, pain <4.

## 2015-01-01 ENCOUNTER — Other Ambulatory Visit: Payer: Self-pay | Admitting: Physician Assistant

## 2015-03-04 ENCOUNTER — Encounter: Payer: Self-pay | Admitting: Cardiology

## 2015-03-04 NOTE — Progress Notes (Signed)
Patient ID: Albert Hardy., male   DOB: July 18, 1955, 60 y.o.   MRN: [id number]   Albert Hardy    Date of visit:  03/04/2015 DOB:  09/13/1955    Age:  60 yrs. Medical record number:  [id number]     Account number:  [id number] Primary Care Provider: Dow Adolph ____________________________ CURRENT DIAGNOSES  1. Nonrheumatic aortic (valve) insufficiency  2. Essential (primary) hypertension  3. Gastro-esophageal reflux disease without esophagitis  4. Hyperlipidemia  5. Atherosclerosis Of Native Arteries Of Extremities With Intermittent Claudication, Bilateral Legs ____________________________ ALLERGIES  Atorvastatin, Muscle aches  Pravastatin, Muscle aches ____________________________ MEDICATIONS  1. amlodipine 5 mg tablet, 1 p.o. daily  2. aspirin 81 mg tablet,chewable, 1 p.o. daily  3. Vitamin D3 1,000 unit tablet, 1 p.o. daily  4. CoQ-10 100 mg capsule, 1 p.o. daily  5. Vitamin B-12 500 mcg tablet, 1 p.o. daily  6. Fish Oil 1,000 mg capsule, 1 p.o. daily  7. magnesium 200 mg tablet, 1 p.o. daily  8. multivitamin tablet, 1 p.o. daily  9. pantoprazole 40 mg tablet,delayed release (DR/EC), 1 p.o. daily  10. Crestor 10 mg tablet, 1 p.o. daily  11. lisinopril 20 mg-hydrochlorothiazide 12.5 mg tablet, 2 p.o. daily ____________________________ CHIEF COMPLAINTS  Followup of Essential (primary) hypertension  Followup of Nonrheumatic aortic (valve) insufficiency ____________________________ HISTORY OF PRESENT ILLNESS  Patient seen for cardiac followup. He has had a good year since he was here. He continues to work detailing cars. He has occasional claudication when he walks a moderate distance. He has no rest pain in his legs. His lipids and blood pressure have been well controlled and he is tolerating Crestor well. He denies angina and has no PND, orthopnea, syncope, or claudication. ____________________________ PAST HISTORY  Past Medical Illnesses:  hypertension, hyperlipidemia;   Cardiovascular Illnesses:  aortic insufficiency, peripheral vascular disease;  Surgical Procedures:  no prior surgical procedures;  NYHA Classification:  I;  Canadian Angina Classification:  Class 0: Asymptomatic;  Cardiology Procedures-Invasive:  no history of prior cardiac procedures;  Cardiology Procedures-Noninvasive:  echocardiogram November 2013, lexiscan cardiolite November 2013, echocardiogram July 2016;  Peripheral Vascular Procedures:  peripheral doppler December 2013;  LVEF of 55% documented via echocardiogram on 08/20/2014,   ____________________________ CARDIO-PULMONARY TEST DATES EKG Date:  08/20/2014;  Nuclear Study Date:  12/28/2011;  Echocardiography Date: 08/20/2014;   ____________________________ FAMILY HISTORY Brother -- Sister alive with problem, Hypertension Brother -- Brother alive and well Mother -- Mother alive with problem, Hypertension Sister -- Sister alive and well Sister -- Sister alive with problem, Hypertension Sister -- Sister alive with problem, Diabetes mellitus Father -- Father dead, Heart Attack ____________________________ SOCIAL HISTORY Alcohol Use:  6 pack q week;  Smoking:  used to smoke but quit 1-2 ppd 2013;  Diet:  low sodium (less than 2 grams);  Lifestyle:  married;  Exercise:  some exercise;  Occupation:  Geophysical data processor;  Residence:  lives with wife;   ____________________________ REVIEW OF SYSTEMS General:  denies recent weight change, fatique or change in exercise tolerance. Eyes: wears eye glasses/contact lenses, denies diplopia, glaucoma or visual field defects. Respiratory: denies dyspnea, cough, wheezing or hemoptysis. Cardiovascular:  please review HPI  Genitourinary-Male: hesitancy  Musculoskeletal:  chronic low back pain, claudication  ____________________________ PHYSICAL EXAMINATION VITAL SIGNS  Blood Pressure:  124/72 Sitting, Right arm, regular cuff  , 132/68 Standing, Right arm and regular cuff   Pulse:  76/min. Weight:  169.00 lbs.  Height:  68.5"BMI: 25  Constitutional:  pleasant African Americian male  in no acute distress Skin:  warm and dry to touch, no apparent skin lesions, or masses noted. Head:  normocephalic, normal hair pattern, no masses or tenderness Chest:  normal symmetry, clear to auscultation. Cardiac:  regular rhythm, normal S1 and S2, No S3 or S4, 2/6 systolic murmur at aortic area  radiating to the neck and 2/6 blowing diastolic murmur at left sternal border Peripheral Pulses:  femoral pulses bounding with bruits, distal pulses diminished ____________________________ MOST RECENT LIPID PANEL 08/27/14  CHOL TOTL 195 mg/dl, LDL 161 NM, HDL 73 mg/dl and TRIGLYCER 55 mg/dl ____________________________ IMPRESSIONS/PLAN  1. Aortic regurgitation asymptomatic 2. Intermittent claudication with peripheral vascular disease 3. Hypertension controlled 4. Hyperlipidemia under treatment  Recommendations:  Return visit in one year with echo to evaluate chamber dimensions. Increase Crestor to 10 mg 3 times a week. The patient exercised for claudication. Continue blood pressure medicines. ____________________________ TODAYS ORDERS  1. Return Visit: 1 year  2. 12 Lead EKG: 1 year  3. 2D, color flow, doppler: 1 year                       ____________________________ Cardiology Physician:  Darden Palmer MD [place]

## 2015-03-11 ENCOUNTER — Other Ambulatory Visit: Payer: Self-pay | Admitting: Physician Assistant

## 2015-04-12 ENCOUNTER — Other Ambulatory Visit: Payer: Self-pay | Admitting: Physician Assistant

## 2015-05-15 ENCOUNTER — Other Ambulatory Visit: Payer: Self-pay | Admitting: Urgent Care

## 2015-05-15 ENCOUNTER — Other Ambulatory Visit: Payer: Self-pay | Admitting: Physician Assistant

## 2015-06-19 ENCOUNTER — Other Ambulatory Visit: Payer: Self-pay | Admitting: Physician Assistant

## 2015-06-19 ENCOUNTER — Other Ambulatory Visit: Payer: Self-pay | Admitting: Urgent Care

## 2015-07-09 ENCOUNTER — Other Ambulatory Visit: Payer: Self-pay | Admitting: Physician Assistant

## 2015-08-19 ENCOUNTER — Other Ambulatory Visit: Payer: Self-pay | Admitting: Physician Assistant

## 2016-03-09 DIAGNOSIS — I359 Nonrheumatic aortic valve disorder, unspecified: Secondary | ICD-10-CM | POA: Diagnosis not present

## 2016-03-09 DIAGNOSIS — I351 Nonrheumatic aortic (valve) insufficiency: Secondary | ICD-10-CM | POA: Diagnosis not present

## 2016-03-09 DIAGNOSIS — I10 Essential (primary) hypertension: Secondary | ICD-10-CM | POA: Diagnosis not present

## 2016-03-09 DIAGNOSIS — I70213 Atherosclerosis of native arteries of extremities with intermittent claudication, bilateral legs: Secondary | ICD-10-CM | POA: Diagnosis not present

## 2016-03-09 DIAGNOSIS — E785 Hyperlipidemia, unspecified: Secondary | ICD-10-CM | POA: Diagnosis not present

## 2016-03-09 DIAGNOSIS — I119 Hypertensive heart disease without heart failure: Secondary | ICD-10-CM | POA: Diagnosis not present

## 2017-03-22 DIAGNOSIS — I351 Nonrheumatic aortic (valve) insufficiency: Secondary | ICD-10-CM | POA: Diagnosis not present

## 2017-03-22 DIAGNOSIS — I119 Hypertensive heart disease without heart failure: Secondary | ICD-10-CM | POA: Diagnosis not present

## 2017-03-22 DIAGNOSIS — I359 Nonrheumatic aortic valve disorder, unspecified: Secondary | ICD-10-CM | POA: Diagnosis not present

## 2017-03-22 DIAGNOSIS — I70213 Atherosclerosis of native arteries of extremities with intermittent claudication, bilateral legs: Secondary | ICD-10-CM | POA: Diagnosis not present

## 2017-03-22 DIAGNOSIS — E7849 Other hyperlipidemia: Secondary | ICD-10-CM | POA: Diagnosis not present

## 2017-03-24 DIAGNOSIS — E7849 Other hyperlipidemia: Secondary | ICD-10-CM | POA: Diagnosis not present

## 2018-01-07 DIAGNOSIS — M9903 Segmental and somatic dysfunction of lumbar region: Secondary | ICD-10-CM | POA: Diagnosis not present

## 2018-01-07 DIAGNOSIS — M5417 Radiculopathy, lumbosacral region: Secondary | ICD-10-CM | POA: Diagnosis not present

## 2018-01-07 DIAGNOSIS — M9902 Segmental and somatic dysfunction of thoracic region: Secondary | ICD-10-CM | POA: Diagnosis not present

## 2018-01-07 DIAGNOSIS — M9905 Segmental and somatic dysfunction of pelvic region: Secondary | ICD-10-CM | POA: Diagnosis not present

## 2018-01-10 DIAGNOSIS — M9903 Segmental and somatic dysfunction of lumbar region: Secondary | ICD-10-CM | POA: Diagnosis not present

## 2018-01-10 DIAGNOSIS — M9905 Segmental and somatic dysfunction of pelvic region: Secondary | ICD-10-CM | POA: Diagnosis not present

## 2018-01-10 DIAGNOSIS — M5417 Radiculopathy, lumbosacral region: Secondary | ICD-10-CM | POA: Diagnosis not present

## 2018-01-10 DIAGNOSIS — M9902 Segmental and somatic dysfunction of thoracic region: Secondary | ICD-10-CM | POA: Diagnosis not present

## 2018-01-12 DIAGNOSIS — M9902 Segmental and somatic dysfunction of thoracic region: Secondary | ICD-10-CM | POA: Diagnosis not present

## 2018-01-12 DIAGNOSIS — M5417 Radiculopathy, lumbosacral region: Secondary | ICD-10-CM | POA: Diagnosis not present

## 2018-01-12 DIAGNOSIS — M9903 Segmental and somatic dysfunction of lumbar region: Secondary | ICD-10-CM | POA: Diagnosis not present

## 2018-01-12 DIAGNOSIS — M9905 Segmental and somatic dysfunction of pelvic region: Secondary | ICD-10-CM | POA: Diagnosis not present

## 2018-01-14 DIAGNOSIS — M9903 Segmental and somatic dysfunction of lumbar region: Secondary | ICD-10-CM | POA: Diagnosis not present

## 2018-01-14 DIAGNOSIS — M5417 Radiculopathy, lumbosacral region: Secondary | ICD-10-CM | POA: Diagnosis not present

## 2018-01-14 DIAGNOSIS — M9902 Segmental and somatic dysfunction of thoracic region: Secondary | ICD-10-CM | POA: Diagnosis not present

## 2018-01-14 DIAGNOSIS — M9905 Segmental and somatic dysfunction of pelvic region: Secondary | ICD-10-CM | POA: Diagnosis not present

## 2018-01-18 DIAGNOSIS — M9902 Segmental and somatic dysfunction of thoracic region: Secondary | ICD-10-CM | POA: Diagnosis not present

## 2018-01-18 DIAGNOSIS — M5417 Radiculopathy, lumbosacral region: Secondary | ICD-10-CM | POA: Diagnosis not present

## 2018-01-18 DIAGNOSIS — M9905 Segmental and somatic dysfunction of pelvic region: Secondary | ICD-10-CM | POA: Diagnosis not present

## 2018-01-18 DIAGNOSIS — M9903 Segmental and somatic dysfunction of lumbar region: Secondary | ICD-10-CM | POA: Diagnosis not present

## 2018-01-20 DIAGNOSIS — M9902 Segmental and somatic dysfunction of thoracic region: Secondary | ICD-10-CM | POA: Diagnosis not present

## 2018-01-20 DIAGNOSIS — M9905 Segmental and somatic dysfunction of pelvic region: Secondary | ICD-10-CM | POA: Diagnosis not present

## 2018-01-20 DIAGNOSIS — M5417 Radiculopathy, lumbosacral region: Secondary | ICD-10-CM | POA: Diagnosis not present

## 2018-01-20 DIAGNOSIS — M9903 Segmental and somatic dysfunction of lumbar region: Secondary | ICD-10-CM | POA: Diagnosis not present

## 2018-01-26 DIAGNOSIS — M9902 Segmental and somatic dysfunction of thoracic region: Secondary | ICD-10-CM | POA: Diagnosis not present

## 2018-01-26 DIAGNOSIS — M9905 Segmental and somatic dysfunction of pelvic region: Secondary | ICD-10-CM | POA: Diagnosis not present

## 2018-01-26 DIAGNOSIS — M9903 Segmental and somatic dysfunction of lumbar region: Secondary | ICD-10-CM | POA: Diagnosis not present

## 2018-01-26 DIAGNOSIS — M5417 Radiculopathy, lumbosacral region: Secondary | ICD-10-CM | POA: Diagnosis not present

## 2018-01-28 DIAGNOSIS — M9905 Segmental and somatic dysfunction of pelvic region: Secondary | ICD-10-CM | POA: Diagnosis not present

## 2018-01-28 DIAGNOSIS — M9902 Segmental and somatic dysfunction of thoracic region: Secondary | ICD-10-CM | POA: Diagnosis not present

## 2018-01-28 DIAGNOSIS — M5417 Radiculopathy, lumbosacral region: Secondary | ICD-10-CM | POA: Diagnosis not present

## 2018-01-28 DIAGNOSIS — M9903 Segmental and somatic dysfunction of lumbar region: Secondary | ICD-10-CM | POA: Diagnosis not present

## 2018-01-31 DIAGNOSIS — M5417 Radiculopathy, lumbosacral region: Secondary | ICD-10-CM | POA: Diagnosis not present

## 2018-01-31 DIAGNOSIS — M9902 Segmental and somatic dysfunction of thoracic region: Secondary | ICD-10-CM | POA: Diagnosis not present

## 2018-01-31 DIAGNOSIS — M9903 Segmental and somatic dysfunction of lumbar region: Secondary | ICD-10-CM | POA: Diagnosis not present

## 2018-01-31 DIAGNOSIS — M9905 Segmental and somatic dysfunction of pelvic region: Secondary | ICD-10-CM | POA: Diagnosis not present

## 2018-02-04 DIAGNOSIS — M5417 Radiculopathy, lumbosacral region: Secondary | ICD-10-CM | POA: Diagnosis not present

## 2018-02-04 DIAGNOSIS — M9902 Segmental and somatic dysfunction of thoracic region: Secondary | ICD-10-CM | POA: Diagnosis not present

## 2018-02-04 DIAGNOSIS — M9905 Segmental and somatic dysfunction of pelvic region: Secondary | ICD-10-CM | POA: Diagnosis not present

## 2018-02-04 DIAGNOSIS — M9903 Segmental and somatic dysfunction of lumbar region: Secondary | ICD-10-CM | POA: Diagnosis not present

## 2018-02-08 DIAGNOSIS — M5417 Radiculopathy, lumbosacral region: Secondary | ICD-10-CM | POA: Diagnosis not present

## 2018-02-08 DIAGNOSIS — M9903 Segmental and somatic dysfunction of lumbar region: Secondary | ICD-10-CM | POA: Diagnosis not present

## 2018-02-08 DIAGNOSIS — M9905 Segmental and somatic dysfunction of pelvic region: Secondary | ICD-10-CM | POA: Diagnosis not present

## 2018-02-08 DIAGNOSIS — M9902 Segmental and somatic dysfunction of thoracic region: Secondary | ICD-10-CM | POA: Diagnosis not present

## 2018-02-23 ENCOUNTER — Telehealth: Payer: Self-pay | Admitting: Cardiology

## 2018-03-03 DIAGNOSIS — M9905 Segmental and somatic dysfunction of pelvic region: Secondary | ICD-10-CM | POA: Diagnosis not present

## 2018-03-03 DIAGNOSIS — M9903 Segmental and somatic dysfunction of lumbar region: Secondary | ICD-10-CM | POA: Diagnosis not present

## 2018-03-03 DIAGNOSIS — M9902 Segmental and somatic dysfunction of thoracic region: Secondary | ICD-10-CM | POA: Diagnosis not present

## 2018-03-03 DIAGNOSIS — M5417 Radiculopathy, lumbosacral region: Secondary | ICD-10-CM | POA: Diagnosis not present

## 2018-03-04 ENCOUNTER — Other Ambulatory Visit: Payer: Self-pay | Admitting: *Deleted

## 2018-03-04 ENCOUNTER — Telehealth: Payer: Self-pay | Admitting: Cardiology

## 2018-03-04 DIAGNOSIS — I351 Nonrheumatic aortic (valve) insufficiency: Secondary | ICD-10-CM

## 2018-03-04 DIAGNOSIS — I10 Essential (primary) hypertension: Secondary | ICD-10-CM

## 2018-03-04 NOTE — Telephone Encounter (Signed)
Patient is seeing Dr. Dulce Sellar on 04/04/2018 and needs an order for an echo before that appt.

## 2018-03-04 NOTE — Telephone Encounter (Signed)
Echocardiogram order has been placed for the [facility] office. Will have front desk call patient to schedule.

## 2018-03-17 DIAGNOSIS — M5417 Radiculopathy, lumbosacral region: Secondary | ICD-10-CM | POA: Diagnosis not present

## 2018-03-17 DIAGNOSIS — M9903 Segmental and somatic dysfunction of lumbar region: Secondary | ICD-10-CM | POA: Diagnosis not present

## 2018-03-17 DIAGNOSIS — M9902 Segmental and somatic dysfunction of thoracic region: Secondary | ICD-10-CM | POA: Diagnosis not present

## 2018-03-17 DIAGNOSIS — M9905 Segmental and somatic dysfunction of pelvic region: Secondary | ICD-10-CM | POA: Diagnosis not present

## 2018-03-28 ENCOUNTER — Telehealth: Payer: Self-pay | Admitting: Cardiology

## 2018-03-28 NOTE — Telephone Encounter (Signed)
°  Patient is totally out of medicine!!!   1. Which medications need to be refilled? (please list name of each medication and dose if known) Lisinopril-hy 20-12.5mg  2 tablets daily; rosuvastatin 40mg  one daily  2. Which pharmacy/location (including street and city if local pharmacy) is medication to be sent to? [facility] on [address]  3. Do they need a 30 day or 90 day supply? 90

## 2018-03-29 MED ORDER — ROSUVASTATIN CALCIUM 5 MG PO TABS: ORAL_TABLET | ORAL | 0 refills | Status: DC

## 2018-03-29 MED ORDER — LISINOPRIL-HYDROCHLOROTHIAZIDE 20-12.5 MG PO TABS: 2.0000 | ORAL_TABLET | Freq: Every day | ORAL | 0 refills | Status: DC

## 2018-03-29 NOTE — Telephone Encounter (Signed)
7 day supply of rosuvastatin and lisinopril-hydrochlorothiazide sent to the [facility] in [place] to get patient to follow up appointment with Dr. Dulce Sellar on 04/04/2018. Patient will receive further refills during appointment.

## 2018-04-02 NOTE — Progress Notes (Signed)
Cardiology Office Note:    Date:  04/02/2018   ID:  Albert Council., DOB [birth year], MRN [id number]  PCP:  No primary care provider on file.  Cardiologist:  Norman Herrlich, MD    Referring MD: No ref. provider found    ASSESSMENT:    No diagnosis found. PLAN:    In order of problems listed above:  1. By exam aortic stenosis appear to have progressed recheck echocardiogram initiate endocarditis prophylaxis presumed bicuspid aortic valve with clinical context age of onset. 2. Aortic regurgitation appears stable await echocardiogram 3. Hypertension stable continue treatment ACE inhibitor diuretic recheck renal function proBNP   Next appointment: 1 yr   Medication Adjustments/Labs and Tests Ordered: Current medicines are reviewed at length with the patient today.  Concerns regarding medicines are outlined above.  No orders of the defined types were placed in this encounter.  No orders of the defined types were placed in this encounter.   No chief complaint on file.   History of Present Illness:    Albert Feland. is a 63 y.o. male with a hx of aortic regurgitation moderate to severe by echo in 2013 last seen Jan 2019. Compliance with diet, lifestyle and medications: Yes  His echocardiogram 1 year ago showed moderate regurgitation mild stenosis.  By physical examination he has transition of the murmur up into his carotids I suspect that he has moderate to severe stenosis requires a follow-up echocardiogram.  Onset of heart murmur was in his 53s no family history of valvular heart disease.  He is an active man has no exercise intolerance shortness of breath chest pain palpitation or syncope.  He will initiate endocarditis prophylaxis with presumed bicuspid aortic valve and repeat echocardiogram regarding severity of aortic stenosis Past Medical History:  Diagnosis Date  . GERD (gastroesophageal reflux disease)   . Heart murmur   . Hyperlipidemia   . Hypertension   . Substance  abuse     No past surgical history on file.  Current Medications: No outpatient medications have been marked as taking for the 04/04/18 encounter (Appointment) with Baldo Daub, MD.     Allergies:   Lipitor [atorvastatin] and Pravastatin   Social History   Socioeconomic History  . Marital status: Married    Spouse name: Not on file  . Number of children: Not on file  . Years of education: Not on file  . Highest education level: Not on file  Occupational History  . Occupation: Recruitment consultant  Social Needs  . Financial resource strain: Not on file  . Food insecurity:    Worry: Not on file    Inability: Not on file  . Transportation needs:    Medical: Not on file    Non-medical: Not on file  Tobacco Use  . Smoking status: Former Smoker    Packs/day: 1.00    Years: 30.00    Pack years: 30.00    Types: Cigarettes    Last attempt to quit: 12/21/2011    Years since quitting: 6.2  . Smokeless tobacco: Never Used  Substance and Sexual Activity  . Alcohol use: Yes    Alcohol/week: 2.0 - 6.0 standard drinks    Types: 1 - 3 Cans of beer, 1 - 3 Shots of liquor per week    Comment: Ocassionaly. 3-4 drinks  . Drug use: Yes    Frequency: 1.0 times per week    Types: Marijuana  . Sexual activity: Yes    Birth control/protection: None  Lifestyle  .  Physical activity:    Days per week: Not on file    Minutes per session: Not on file  . Stress: Not on file  Relationships  . Social connections:    Talks on phone: Not on file    Gets together: Not on file    Attends religious service: Not on file    Active member of club or organization: Not on file    Attends meetings of clubs or organizations: Not on file    Relationship status: Not on file  Other Topics Concern  . Not on file  Social History Narrative   Married. Education: [employer]. Exercise:  "Somewhat".     Family History: The patient's family history includes Arthritis in his mother; Colon cancer in his  mother; Diabetes in his sister; Hyperlipidemia in his brother, brother, mother, and sister; Hypertension in his brother, mother, and sister; Other in his brother. ROS:   Please see the history of present illness.    All other systems reviewed and are negative.  EKGs/Labs/Other Studies Reviewed:    The following studies were reviewed today:  EKG:  EKG ordered today.  The ekg ordered today demonstrates sinus rhythm and is normal  Recent Labs: No results found for requested labs within last 8760 hours.  Recent Lipid Panel    Component Value Date/Time   CHOL 195 08/27/2014 1511   TRIG 55 08/27/2014 1511   HDL 73 08/27/2014 1511   CHOLHDL 2.7 08/27/2014 1511   VLDL 11 08/27/2014 1511   LDLCALC 111 (H) 08/27/2014 1511    Physical Exam:    VS:  There were no vitals taken for this visit.    Wt Readings from Last 3 Encounters:  12/24/14 162 lb (73.5 kg)  12/10/14 162 lb 6.4 oz (73.7 kg)  08/27/14 162 lb 3.2 oz (73.6 kg)     GEN:  Well nourished, well developed in no acute distress HEENT: Normal NECK: No JVD; No carotid bruits LYMPHATICS: No lymphadenopathy CARDIAC: He has a harsh 2/6 to 3/6 murmur aortic area and radiates up into the carotids S2 using single 1 of 6 AR heard in the aortic area no radiation no S3 no click RRR, no  rubs, gallops RESPIRATORY:  Clear to auscultation without rales, wheezing or rhonchi  ABDOMEN: Soft, non-tender, non-distended MUSCULOSKELETAL:  No edema; No deformity  SKIN: Warm and dry NEUROLOGIC:  Alert and oriented x 3 PSYCHIATRIC:  Normal affect    Signed, Norman Herrlich, MD  04/02/2018 4:51 PM    [facility]

## 2018-04-04 ENCOUNTER — Ambulatory Visit (INDEPENDENT_AMBULATORY_CARE_PROVIDER_SITE_OTHER): Payer: BLUE CROSS/BLUE SHIELD | Admitting: Cardiology

## 2018-04-04 VITALS — BP 100/56 | HR 52 | Ht 69.0 in | Wt 166.8 lb

## 2018-04-04 DIAGNOSIS — I351 Nonrheumatic aortic (valve) insufficiency: Secondary | ICD-10-CM | POA: Diagnosis not present

## 2018-04-04 DIAGNOSIS — I350 Nonrheumatic aortic (valve) stenosis: Secondary | ICD-10-CM | POA: Insufficient documentation

## 2018-04-04 DIAGNOSIS — I10 Essential (primary) hypertension: Secondary | ICD-10-CM | POA: Diagnosis not present

## 2018-04-04 HISTORY — DX: Nonrheumatic aortic (valve) stenosis: I35.0

## 2018-04-04 MED ORDER — AMOXICILLIN 500 MG PO CAPS: ORAL_CAPSULE | ORAL | 0 refills | Status: DC

## 2018-04-04 MED ORDER — AMLODIPINE BESYLATE 5 MG PO TABS: ORAL_TABLET | ORAL | 11 refills | Status: DC

## 2018-04-04 MED ORDER — LISINOPRIL-HYDROCHLOROTHIAZIDE 20-12.5 MG PO TABS: 2.0000 | ORAL_TABLET | Freq: Every day | ORAL | 3 refills | Status: DC

## 2018-04-04 MED ORDER — ROSUVASTATIN CALCIUM 5 MG PO TABS: ORAL_TABLET | ORAL | 11 refills | Status: DC

## 2018-04-04 NOTE — Patient Instructions (Signed)
Medication Instructions:  Your physician has recommended you make the following change in your medication:   START taking Amoxicillin 500 mg (4 tablets) by mouth 1 hour prior to dental procedures.  If you need a refill on your cardiac medications before your next appointment, please call your pharmacy.   Lab work: Your physician recommends that you have a BMP and ProBNP drawn.  If you have labs (blood work) drawn today and your tests are completely normal, you will receive your results only by: Marland Kitchen. MyChart Message (if you have MyChart) OR . A paper copy in the mail If you have any lab test that is abnormal or we need to change your treatment, we will call you to review the results.  Testing/Procedures: An EKG was performed today.  Your physician has requested that you have an echocardiogram. Echocardiography is a painless test that uses sound waves to create images of your heart. It provides your doctor with information about the size and shape of your heart and how well your heart's chambers and valves are working. This procedure takes approximately one hour. There are no restrictions for this procedure.   Follow-Up: At [facility], you and your health needs are our priority.  As part of our continuing mission to provide you with exceptional heart care, we have created designated Provider Care Teams.  These Care Teams include your primary Cardiologist (physician) and Advanced Practice Providers (APPs -  Physician Assistants and Nurse Practitioners) who all work together to provide you with the care you need, when you need it. You will need a follow up appointment in 1 years.  Please call our office 2 months in advance to schedule this appointment.  You may see No primary care provider on file.    Any Other Special Instructions Will Be Listed Below  Amoxicillin capsules or tablets What is this medicine? AMOXICILLIN (a mox i SIL in) is a penicillin antibiotic. It is used to treat certain  kinds of bacterial infections. It will not work for colds, flu, or other viral infections. This medicine may be used for other purposes; ask your health care provider or pharmacist if you have questions. COMMON BRAND NAME(S): Amoxil, Moxilin, Sumox, Trimox What should I tell my health care provider before I take this medicine? They need to know if you have any of these conditions: -asthma -kidney disease -an unusual or allergic reaction to amoxicillin, other penicillins, cephalosporin antibiotics, other medicines, foods, dyes, or preservatives -pregnant or trying to get pregnant -breast-feeding How should I use this medicine? Take this medicine by mouth with a glass of water. Follow the directions on your prescription label. You may take this medicine with food or on an empty stomach. Take your medicine at regular intervals. Do not take your medicine more often than directed. Take all of your medicine as directed even if you think your are better. Do not skip doses or stop your medicine early. Talk to your pediatrician regarding the use of this medicine in children. While this drug may be prescribed for selected conditions, precautions do apply. Overdosage: If you think you have taken too much of this medicine contact a poison control center or emergency room at once. NOTE: This medicine is only for you. Do not share this medicine with others. What if I miss a dose? If you miss a dose, take it as soon as you can. If it is almost time for your next dose, take only that dose. Do not take double or extra doses.  What may interact with this medicine? -amiloride -birth control pills -chloramphenicol -macrolides -probenecid -sulfonamides -tetracyclines This list may not describe all possible interactions. Give your health care provider a list of all the medicines, herbs, non-prescription drugs, or dietary supplements you use. Also tell them if you smoke, drink alcohol, or use illegal drugs. Some  items may interact with your medicine. What should I watch for while using this medicine? Tell your doctor or health care professional if your symptoms do not improve in 2 or 3 days. Take all of the doses of your medicine as directed. Do not skip doses or stop your medicine early. If you are diabetic, you may get a false positive result for sugar in your urine with certain brands of urine tests. Check with your doctor. Do not treat diarrhea with over-the-counter products. Contact your doctor if you have diarrhea that lasts more than 2 days or if the diarrhea is severe and watery. What side effects may I notice from receiving this medicine? Side effects that you should report to your doctor or health care professional as soon as possible: -allergic reactions like skin rash, itching or hives, swelling of the face, lips, or tongue -breathing problems -dark urine -redness, blistering, peeling or loosening of the skin, including inside the mouth -seizures -severe or watery diarrhea -trouble passing urine or change in the amount of urine -unusual bleeding or bruising -unusually weak or tired -yellowing of the eyes or skin Side effects that usually do not require medical attention (report to your doctor or health care professional if they continue or are bothersome): -dizziness -headache -stomach upset -trouble sleeping This list may not describe all possible side effects. Call your doctor for medical advice about side effects. You may report side effects to FDA at 1-800-FDA-1088. Where should I keep my medicine? Keep out of the reach of children. Store between 68 and 77 degrees F (20 and 25 degrees C). Keep bottle closed tightly. Throw away any unused medicine after the expiration date. NOTE: This sheet is a summary. It may not cover all possible information. If you have questions about this medicine, talk to your doctor, pharmacist, or health care provider.  2019 Elsevier/Gold Standard  (2007-04-19 14:10:59)  Echocardiogram An echocardiogram is a procedure that uses painless sound waves (ultrasound) to produce an image of the heart. Images from an echocardiogram can provide important information about:  Signs of coronary artery disease (CAD).  Aneurysm detection. An aneurysm is a weak or damaged part of an artery wall that bulges out from the normal force of blood pumping through the body.  Heart size and shape. Changes in the size or shape of the heart can be associated with certain conditions, including heart failure, aneurysm, and CAD.  Heart muscle function.  Heart valve function.  Signs of a past heart attack.  Fluid buildup around the heart.  Thickening of the heart muscle.  A tumor or infectious growth around the heart valves. Tell a health care provider about:  Any allergies you have.  All medicines you are taking, including vitamins, herbs, eye drops, creams, and over-the-counter medicines.  Any blood disorders you have.  Any surgeries you have had.  Any medical conditions you have.  Whether you are pregnant or may be pregnant. What are the risks? Generally, this is a safe procedure. However, problems may occur, including:  Allergic reaction to dye (contrast) that may be used during the procedure. What happens before the procedure? No specific preparation is needed. You may eat  and drink normally. What happens during the procedure?   An IV tube may be inserted into one of your veins.  You may receive contrast through this tube. A contrast is an injection that improves the quality of the pictures from your heart.  A gel will be applied to your chest.  A wand-like tool (transducer) will be moved over your chest. The gel will help to transmit the sound waves from the transducer.  The sound waves will harmlessly bounce off of your heart to allow the heart images to be captured in real-time motion. The images will be recorded on a  computer. The procedure may vary among health care providers and hospitals. What happens after the procedure?  You may return to your normal, everyday life, including diet, activities, and medicines, unless your health care provider tells you not to do that. Summary  An echocardiogram is a procedure that uses painless sound waves (ultrasound) to produce an image of the heart.  Images from an echocardiogram can provide important information about the size and shape of your heart, heart muscle function, heart valve function, and fluid buildup around your heart.  You do not need to do anything to prepare before this procedure. You may eat and drink normally.  After the echocardiogram is completed, you may return to your normal, everyday life, unless your health care provider tells you not to do that. This information is not intended to replace advice given to you by your health care provider. Make sure you discuss any questions you have with your health care provider. Document Released: 01/24/2000 Document Revised: 02/29/2016 Document Reviewed: 02/29/2016 Elsevier Interactive Patient Education  2019 [employer].

## 2018-04-05 ENCOUNTER — Ambulatory Visit (HOSPITAL_BASED_OUTPATIENT_CLINIC_OR_DEPARTMENT_OTHER)
Admission: RE | Admit: 2018-04-05 | Discharge: 2018-04-05 | Disposition: A | Discharge status: Home / Self Care | Payer: BLUE CROSS/BLUE SHIELD | Source: Ambulatory Visit | Attending: Cardiology | Admitting: Cardiology

## 2018-04-05 DIAGNOSIS — I351 Nonrheumatic aortic (valve) insufficiency: Secondary | ICD-10-CM | POA: Diagnosis not present

## 2018-04-05 DIAGNOSIS — I350 Nonrheumatic aortic (valve) stenosis: Secondary | ICD-10-CM | POA: Insufficient documentation

## 2018-04-05 DIAGNOSIS — I10 Essential (primary) hypertension: Secondary | ICD-10-CM | POA: Insufficient documentation

## 2018-04-05 LAB — BASIC METABOLIC PANEL
BUN/Creatinine Ratio: 18 (ref 10–24)
BUN: 21 mg/dL (ref 8–27)
CO2: 26 mmol/L (ref 20–29)
Calcium: 9.8 mg/dL (ref 8.6–10.2)
Chloride: 100 mmol/L (ref 96–106)
Creatinine, Ser: 1.15 mg/dL (ref 0.76–1.27)
GFR calc non Af Amer: 68 mL/min/{1.73_m2} (ref >59)
GFR, EST AFRICAN AMERICAN: 78 mL/min/{1.73_m2} (ref >59)
Glucose: 104 mg/dL — ABNORMAL HIGH (ref 65–99)
Potassium: 4.3 mmol/L (ref 3.5–5.2)
SODIUM: 142 mmol/L (ref 134–144)

## 2018-04-05 LAB — PRO B NATRIURETIC PEPTIDE: NT-Pro BNP: 44 pg/mL (ref 0–210)

## 2018-04-05 NOTE — Progress Notes (Signed)
  Echocardiogram 2D Echocardiogram has been performed.  Lexander Tremblay T Janissa Bertram 04/05/2018, 9:45 AM

## 2018-07-08 DIAGNOSIS — M9905 Segmental and somatic dysfunction of pelvic region: Secondary | ICD-10-CM | POA: Diagnosis not present

## 2018-07-08 DIAGNOSIS — M5417 Radiculopathy, lumbosacral region: Secondary | ICD-10-CM | POA: Diagnosis not present

## 2018-07-08 DIAGNOSIS — M9903 Segmental and somatic dysfunction of lumbar region: Secondary | ICD-10-CM | POA: Diagnosis not present

## 2018-07-08 DIAGNOSIS — M9902 Segmental and somatic dysfunction of thoracic region: Secondary | ICD-10-CM | POA: Diagnosis not present

## 2018-07-29 DIAGNOSIS — M5417 Radiculopathy, lumbosacral region: Secondary | ICD-10-CM | POA: Diagnosis not present

## 2018-07-29 DIAGNOSIS — M9902 Segmental and somatic dysfunction of thoracic region: Secondary | ICD-10-CM | POA: Diagnosis not present

## 2018-07-29 DIAGNOSIS — M9905 Segmental and somatic dysfunction of pelvic region: Secondary | ICD-10-CM | POA: Diagnosis not present

## 2018-07-29 DIAGNOSIS — M9903 Segmental and somatic dysfunction of lumbar region: Secondary | ICD-10-CM | POA: Diagnosis not present

## 2018-08-26 DIAGNOSIS — M5417 Radiculopathy, lumbosacral region: Secondary | ICD-10-CM | POA: Diagnosis not present

## 2018-08-26 DIAGNOSIS — M9905 Segmental and somatic dysfunction of pelvic region: Secondary | ICD-10-CM | POA: Diagnosis not present

## 2018-08-26 DIAGNOSIS — M9902 Segmental and somatic dysfunction of thoracic region: Secondary | ICD-10-CM | POA: Diagnosis not present

## 2018-08-26 DIAGNOSIS — M9903 Segmental and somatic dysfunction of lumbar region: Secondary | ICD-10-CM | POA: Diagnosis not present

## 2018-09-23 DIAGNOSIS — M5417 Radiculopathy, lumbosacral region: Secondary | ICD-10-CM | POA: Diagnosis not present

## 2018-09-23 DIAGNOSIS — M9903 Segmental and somatic dysfunction of lumbar region: Secondary | ICD-10-CM | POA: Diagnosis not present

## 2018-09-23 DIAGNOSIS — M9905 Segmental and somatic dysfunction of pelvic region: Secondary | ICD-10-CM | POA: Diagnosis not present

## 2018-09-23 DIAGNOSIS — M9902 Segmental and somatic dysfunction of thoracic region: Secondary | ICD-10-CM | POA: Diagnosis not present

## 2018-10-13 DIAGNOSIS — J343 Hypertrophy of nasal turbinates: Secondary | ICD-10-CM | POA: Diagnosis not present

## 2018-10-13 DIAGNOSIS — H6123 Impacted cerumen, bilateral: Secondary | ICD-10-CM

## 2018-10-13 DIAGNOSIS — Z87891 Personal history of nicotine dependence: Secondary | ICD-10-CM | POA: Diagnosis not present

## 2018-10-13 DIAGNOSIS — H9313 Tinnitus, bilateral: Secondary | ICD-10-CM

## 2018-10-13 HISTORY — DX: Tinnitus, bilateral: H93.13

## 2018-10-13 HISTORY — DX: Impacted cerumen, bilateral: H61.23

## 2018-12-14 DIAGNOSIS — H903 Sensorineural hearing loss, bilateral: Secondary | ICD-10-CM | POA: Diagnosis not present

## 2018-12-14 DIAGNOSIS — H9313 Tinnitus, bilateral: Secondary | ICD-10-CM | POA: Diagnosis not present

## 2018-12-14 HISTORY — DX: Sensorineural hearing loss, bilateral: H90.3

## 2019-02-17 ENCOUNTER — Ambulatory Visit: Payer: Self-pay | Admitting: Family Medicine

## 2019-03-03 ENCOUNTER — Ambulatory Visit: Payer: BC Managed Care – PPO | Admitting: Family Medicine

## 2019-03-03 ENCOUNTER — Other Ambulatory Visit: Payer: Self-pay

## 2019-03-03 ENCOUNTER — Ambulatory Visit (INDEPENDENT_AMBULATORY_CARE_PROVIDER_SITE_OTHER): Payer: BC Managed Care – PPO

## 2019-03-03 VITALS — BP 118/66 | HR 63 | Temp 98.2°F | Ht 69.0 in | Wt 163.0 lb

## 2019-03-03 DIAGNOSIS — R059 Cough, unspecified: Secondary | ICD-10-CM

## 2019-03-03 DIAGNOSIS — I10 Essential (primary) hypertension: Secondary | ICD-10-CM | POA: Diagnosis not present

## 2019-03-03 DIAGNOSIS — F17290 Nicotine dependence, other tobacco product, uncomplicated: Secondary | ICD-10-CM

## 2019-03-03 DIAGNOSIS — I350 Nonrheumatic aortic (valve) stenosis: Secondary | ICD-10-CM

## 2019-03-03 DIAGNOSIS — R0600 Dyspnea, unspecified: Secondary | ICD-10-CM | POA: Diagnosis not present

## 2019-03-03 DIAGNOSIS — R12 Heartburn: Secondary | ICD-10-CM

## 2019-03-03 DIAGNOSIS — Z122 Encounter for screening for malignant neoplasm of respiratory organs: Secondary | ICD-10-CM

## 2019-03-03 DIAGNOSIS — R05 Cough: Secondary | ICD-10-CM | POA: Diagnosis not present

## 2019-03-03 DIAGNOSIS — R0609 Other forms of dyspnea: Secondary | ICD-10-CM

## 2019-03-03 DIAGNOSIS — R0602 Shortness of breath: Secondary | ICD-10-CM | POA: Diagnosis not present

## 2019-03-03 DIAGNOSIS — E785 Hyperlipidemia, unspecified: Secondary | ICD-10-CM | POA: Diagnosis not present

## 2019-03-03 NOTE — Progress Notes (Signed)
Subjective:  Patient ID: Albert CouncilFloyd Fabiano Jr., male    DOB: April 23, 1955  Age: 64 y.o. MRN: [id number]  CC:  Chief Complaint  Patient presents with  . New Patient (Initial Visit)    establish care.     HPI Albert CouncilFloyd Herman Jr. presents for   Presents to establish care, previous primary care provider Deliah BostonMichael Clark in 2016.Marland Kitchen.  He is followed by cardiology for aortic valve insufficiency, aortic valve stenosis, hypertension.  Cardiology Dr. Dulce SellarMunley, last appointment noted February 2020.  1 year follow-up planned. Echo stable in 2020. Has not yet scheduled cardiology follow up.   History of sensorineural hearing loss, tinnitus, treated by Dr. Jenne PaneBates with ENT.  Most recent visit noted from December 14, 2018.   Hypertension: Lisinopril HCTZ 20/12.5 mg 2 tablets daily, amlodipine 5 mg daily. Home readings: 120/60's. No new side effects with meds.  Occasional shortness of breath at times past 6 months when more active only. occasoinal gas feeling with belching to clear, no other chest pains. Mild dry cough at times. No fever. No weigh loss. No hemoptysis. Occasional heartburn.  Quit smoking cigarettes in 2013 (30 pack year history prior)- vaping since then - no thc. Ready to quit vaping.   BP Readings from Last 3 Encounters:  03/03/19 118/66  04/04/18 (!) 100/56  12/24/14 (!) 146/74   Lab Results  Component Value Date   CREATININE 1.15 04/04/2018    Hyperlipidemia: Crestor 5 mg every other night. No recent testing. Tolerated at current dose.  Lab Results  Component Value Date   CHOL 195 08/27/2014   HDL 73 08/27/2014   LDLCALC 111 (H) 08/27/2014   TRIG 55 08/27/2014   CHOLHDL 2.7 08/27/2014   Lab Results  Component Value Date   ALT 9 08/27/2014   AST 16 08/27/2014   ALKPHOS 46 08/27/2014   BILITOT 0.4 08/27/2014   Health maintenance: Up-to-date. Flu vaccine today.   Immunization History  Administered Date(s) Administered  . Tdap 08/27/2014     History Patient Active Problem List     Diagnosis Date Noted  . Aortic stenosis 04/04/2018  . Atherosclerosis of native arteries of the extremities with intermittent claudication 09/08/2013  . Former moderate cigarette smoker (10-19 per day) 05/20/2012  . Peripheral vascular disease with claudication   . Hyperlipidemia   . Aortic regurgitation   . GERD (gastroesophageal reflux disease)   . Hypertension    Past Medical History:  Diagnosis Date  . Aortic regurgitation   . Atherosclerosis of native arteries of the extremities with intermittent claudication 09/08/2013  . GERD (gastroesophageal reflux disease)   . Heart murmur   . Hyperlipidemia   . Hypertension   . Peripheral vascular disease with claudication    ABI .49    . Substance abuse [facility](HCC)    Past Surgical History:  Procedure Laterality Date  . NO PAST SURGERIES     Allergies  Allergen Reactions  . Lipitor [Atorvastatin]     myalgia  . Pravastatin Other (See Comments)    myalgia   Prior to Admission medications   Medication Sig Start Date End Date Taking? Authorizing Provider  amLODipine (NORVASC) 5 MG tablet TAKE ONE TABLET BY MOUTH ONCE DAILY 04/04/18  Yes Baldo DaubMunley, Brian J, MD  Cholecalciferol (VITAMIN D3) 1000 UNITS CAPS Take 1 capsule by mouth daily.   Yes [provider]  Coenzyme Q10 (CO Q 10 PO) Take 1 tablet by mouth daily.   Yes [provider]  Cyanocobalamin (VITAMIN B-12 PO) 1 tablet  daily.   Yes [provider]  ibuprofen (ADVIL,MOTRIN) 200 MG tablet Take 200 mg by mouth every 6 (six) hours as needed.   Yes [provider]  KRILL OIL PO Take 1 tablet by mouth daily.   Yes [provider]  lisinopril-hydrochlorothiazide (PRINZIDE,ZESTORETIC) 20-12.5 MG tablet Take 2 tablets by mouth daily. 04/04/18  Yes Baldo Daub, MD  MAGNESIUM PO Take 1 tablet by mouth daily.   Yes [provider]  Multiple Vitamins-Minerals (CENTRUM SILVER PO) Take 1 tablet by mouth daily.   Yes [provider]   Multiple Vitamins-Minerals (ZINC PO) Take 1 tablet by mouth daily.   Yes [provider]  rosuvastatin (CRESTOR) 5 MG tablet Take 1 tablet every other night at bedtime. 04/04/18  Yes Baldo Daub, MD  TURMERIC PO Take 1 tablet by mouth daily.    Yes [provider]  aspirin 81 MG tablet Take 81 mg by mouth daily.    [provider]   Social History   Socioeconomic History  . Marital status: Married    Spouse name: Not on file  . Number of children: Not on file  . Years of education: Not on file  . Highest education level: Not on file  Occupational History  . Occupation: Recruitment consultant  Tobacco Use  . Smoking status: Former Smoker    Packs/day: 1.00    Years: 30.00    Pack years: 30.00    Types: Cigarettes    Quit date: 12/21/2011    Years since quitting: 7.2  . Smokeless tobacco: Never Used  Substance and Sexual Activity  . Alcohol use: Yes    Comment: Ocassionaly.   . Drug use: Not Currently    Frequency: 1.0 times per week    Types: Marijuana  . Sexual activity: Yes    Birth control/protection: None  Other Topics Concern  . Not on file  Social History Narrative   Married. Education: [employer]. Exercise:  "Somewhat".   Social Determinants of Health   Financial Resource Strain:   . Difficulty of Paying Living Expenses: Not on file  Food Insecurity:   . Worried About Programme researcher, broadcasting/film/video in the Last Year: Not on file  . Ran Out of Food in the Last Year: Not on file  Transportation Needs:   . Lack of Transportation (Medical): Not on file  . Lack of Transportation (Non-Medical): Not on file  Physical Activity:   . Days of Exercise per Week: Not on file  . Minutes of Exercise per Session: Not on file  Stress:   . Feeling of Stress : Not on file  Social Connections:   . Frequency of Communication with Friends and Family: Not on file  . Frequency of Social Gatherings with Friends and Family: Not on file  . Attends Religious Services:  Not on file  . Active Member of Clubs or Organizations: Not on file  . Attends Banker Meetings: Not on file  . Marital Status: Not on file  Intimate Partner Violence:   . Fear of Current or Ex-Partner: Not on file  . Emotionally Abused: Not on file  . Physically Abused: Not on file  . Sexually Abused: Not on file    Review of Systems  Constitutional: Negative for fatigue and unexpected weight change.  Eyes: Negative for visual disturbance.  Respiratory: Positive for cough and shortness of breath. Negative for chest tightness.   Cardiovascular: Negative for chest pain (gas sensation relieved by belching only. ),  palpitations and leg swelling.  Gastrointestinal: Negative for abdominal pain and blood in stool.  Neurological: Negative for dizziness, light-headedness and headaches.    Objective:   Vitals:   03/03/19 1328  BP: 118/66  Pulse: 63  Temp: 98.2 F (36.8 C)  TempSrc: Oral  Weight: 163 lb (73.9 kg)  Height: 5\' 9"  (1.753 m)     Physical Exam Vitals reviewed.  Constitutional:      Appearance: He is well-developed.  HENT:     Head: Normocephalic and atraumatic.  Eyes:     Pupils: Pupils are equal, round, and reactive to light.  Neck:     Vascular: No carotid bruit or JVD.  Cardiovascular:     Rate and Rhythm: Normal rate and regular rhythm.     Heart sounds: Normal heart sounds. No murmur.  Pulmonary:     Effort: Pulmonary effort is normal. No respiratory distress.     Breath sounds: Normal breath sounds. No stridor. No wheezing, rhonchi or rales.  Chest:     Chest wall: No tenderness.  Abdominal:     General: Abdomen is flat.     Tenderness: There is no abdominal tenderness.  Musculoskeletal:     Right lower leg: No edema.     Left lower leg: No edema.  Skin:    General: Skin is warm and dry.  Neurological:     Mental Status: He is alert and oriented to person, place, and time.    EKG: Sinus bradycardia, rate 52.  Flat T waves in lead  III, no acute findings.  DG Chest 2 View  Result Date: 03/03/2019 CLINICAL DATA:  Cough and shortness of breath EXAM: CHEST - 2 VIEW COMPARISON:  December 20, 2011 FINDINGS: Lungs are clear. Heart size and pulmonary vascularity are normal. No adenopathy. No bone lesions. IMPRESSION: Lungs clear.  Cardiac silhouette normal.  No adenopathy. Electronically Signed   By: December 22, 2011 III M.D.   On: 03/03/2019 14:31     Assessment & Plan:  Albert Ostergaard. is a 64 y.o. male . Cough - Plan: Pro b natriuretic peptide, DG Chest 2 View DOE (dyspnea on exertion) - Plan: Pro b natriuretic peptide, DG Chest 2 View, EKG 12-Lead Aortic valve stenosis, etiology of cardiac valve disease unspecified - Plan: Pro b natriuretic peptide, EKG 12-Lead  -reassuring CXR, no acute findings on EKG.previous AS stable. Symptoms past 6 months - possible deconditioning component.  Will check BNP, follow up with cardiology discussed and ER/911 precautions.   - may have reflux component with cough - trigger avoidance, pepcid otc discussed.   Vaping nicotine dependence, tobacco product - Plan:  Encounter for screening for lung cancer - Plan: Ambulatory Referral for Lung Cancer Screen  - tobacco with vaping cessation discussed, resources/handout given. Consider PFT/pulm eval.   - referred for lung cancer screening.   Essential hypertension - Plan: EKG 12-Lead, Comprehensive metabolic panel  -  Stable, tolerating current regimen. Medications refilled. Labs pending as above.   Heartburn  - as above - trigger avoidance, otc treatment for now, cessation of vaping. Recheck 3 weeks.   Hyperlipidemia, unspecified hyperlipidemia type - Plan: EKG 12-Lead, Comprehensive metabolic panel, Lipid panel  - tolerating statin - updated labs ordered.    No orders of the defined types were placed in this encounter.  Patient Instructions   I will refer you for lung cancer screening, but call cardiology for appointment (Dr. 10-18-1993)  to discuss the shortness of breath as you are due for  follow up as well.   Follow up in next 3 weeks to discuss cough. pepcid over the counter if needed for heartburn, but try to avoid triggers. Heartburn can trigger heartburn.   1-800 quit-now  To help with quitting vaping. We can discuss further next visit as well.   Thanks for coming in today. Return to the clinic or go to the nearest emergency room if any of your symptoms worsen or new symptoms occur.     Food Choices for Gastroesophageal Reflux Disease, Adult When you have gastroesophageal reflux disease (GERD), the foods you eat and your eating habits are very important. Choosing the right foods can help ease the discomfort of GERD. Consider working with a diet and nutrition specialist (dietitian) to help you make healthy food choices. What general guidelines should I follow?  Eating plan  Choose healthy foods low in fat, such as fruits, vegetables, whole grains, low-fat dairy products, and lean meat, fish, and poultry.  Eat frequent, small meals instead of three large meals each day. Eat your meals slowly, in a relaxed setting. Avoid bending over or lying down until 2-3 hours after eating.  Limit high-fat foods such as fatty meats or fried foods.  Limit your intake of oils, butter, and shortening to less than 8 teaspoons each day.  Avoid the following: ? Foods that cause symptoms. These may be different for different people. Keep a food diary to keep track of foods that cause symptoms. ? Alcohol. ? Drinking large amounts of liquid with meals. ? Eating meals during the 2-3 hours before bed.  Cook foods using methods other than frying. This may include baking, grilling, or broiling. Lifestyle  Maintain a healthy weight. Ask your health care provider what weight is healthy for you. If you need to lose weight, work with your health care provider to do so safely.  Exercise for at least 30 minutes on 5 or more days each week, or  as told by your health care provider.  Avoid wearing clothes that fit tightly around your waist and chest.  Do not use any products that contain nicotine or tobacco, such as cigarettes and e-cigarettes. If you need help quitting, ask your health care provider.  Sleep with the head of your bed raised. Use a wedge under the mattress or blocks under the bed frame to raise the head of the bed. What foods are not recommended? The items listed may not be a complete list. Talk with your dietitian about what dietary choices are best for you. Grains Pastries or quick breads with added fat. [place] toast. Vegetables Deep fried vegetables. [place] fries. Any vegetables prepared with added fat. Any vegetables that cause symptoms. For some people this may include tomatoes and tomato products, chili peppers, onions and garlic, and horseradish. Fruits Any fruits prepared with added fat. Any fruits that cause symptoms. For some people this may include citrus fruits, such as oranges, grapefruit, pineapple, and lemons. Meats and other protein foods High-fat meats, such as fatty beef or pork, hot dogs, ribs, ham, sausage, salami and bacon. Fried meat or protein, including fried fish and fried chicken. Nuts and nut butters. Dairy Whole milk and chocolate milk. Sour cream. Cream. Ice cream. Cream cheese. Milk shakes. Beverages Coffee and tea, with or without caffeine. Carbonated beverages. Sodas. Energy drinks. Fruit juice made with acidic fruits (such as orange or grapefruit). Tomato juice. Alcoholic drinks. Fats and oils Butter. Margarine. Shortening. Ghee. Sweets and desserts Chocolate and cocoa. Donuts. Seasoning and other foods  Pepper. Peppermint and spearmint. Any condiments, herbs, or seasonings that cause symptoms. For some people, this may include curry, hot sauce, or vinegar-based salad dressings. Summary  When you have gastroesophageal reflux disease (GERD), food and lifestyle choices are very  important to help ease the discomfort of GERD.  Eat frequent, small meals instead of three large meals each day. Eat your meals slowly, in a relaxed setting. Avoid bending over or lying down until 2-3 hours after eating.  Limit high-fat foods such as fatty meat or fried foods. This information is not intended to replace advice given to you by your health care provider. Make sure you discuss any questions you have with your health care provider. Document Revised: 05/19/2018 Document Reviewed: 01/28/2016 Elsevier Patient Education  2020 [employer].   Steps to Quit Smoking Smoking tobacco is the leading cause of preventable death. It can affect almost every organ in the body. Smoking puts you and those around you at risk for developing many serious chronic diseases. Quitting smoking can be difficult, but it is one of the best things that you can do for your health. It is never too late to quit. How do I get ready to quit? When you decide to quit smoking, create a plan to help you succeed. Before you quit:  Pick a date to quit. Set a date within the next 2 weeks to give you time to prepare.  Write down the reasons why you are quitting. Keep this list in places where you will see it often.  Tell your family, friends, and co-workers that you are quitting. Support from your loved ones can make quitting easier.  Talk with your health care provider about your options for quitting smoking.  Find out what treatment options are covered by your health insurance.  Identify people, places, things, and activities that make you want to smoke (triggers). Avoid them. What first steps can I take to quit smoking?  Throw away all cigarettes at home, at work, and in your car.  Throw away smoking accessories, such as Scientist, research (medical).  Clean your car. Make sure to empty the ashtray.  Clean your home, including curtains and carpets. What strategies can I use to quit smoking? Talk with your health  care provider about combining strategies, such as taking medicines while you are also receiving in-person counseling. Using these two strategies together makes you more likely to succeed in quitting than if you used either strategy on its own.  If you are pregnant or breastfeeding, talk with your health care provider about finding counseling or other support strategies to quit smoking. Do not take medicine to help you quit smoking unless your health care provider tells you to do so. To quit smoking: Quit right away  Quit smoking completely, instead of gradually reducing how much you smoke over a period of time. Research shows that stopping smoking right away is more successful than gradually quitting.  Attend in-person counseling to help you build problem-solving skills. You are more likely to succeed in quitting if you attend counseling sessions regularly. Even short sessions of 10 minutes can be effective. Take medicine You may take medicines to help you quit smoking. Some medicines require a prescription and some you can purchase over-the-counter. Medicines may have nicotine in them to replace the nicotine in cigarettes. Medicines may:  Help to stop cravings.  Help to relieve withdrawal symptoms. Your health care provider may recommend:  Nicotine patches, gum, or lozenges.  Nicotine inhalers or sprays.  Non-nicotine medicine that is taken by mouth. Find resources Find resources and support systems that can help you to quit smoking and remain smoke-free after you quit. These resources are most helpful when you use them often. They include:  Online chats with a Veterinary surgeon.  Telephone quitlines.  Printed Materials engineer.  Support groups or group counseling.  Text messaging programs.  Mobile phone apps or applications. Use apps that can help you stick to your quit plan by providing reminders, tips, and encouragement. There are many free apps for mobile devices as well as  websites. Examples include Quit Guide from the [employer] and smokefree.gov What things can I do to make it easier to quit?   Reach out to your family and friends for support and encouragement. Call telephone quitlines (1-800-QUIT-NOW), reach out to support groups, or work with a counselor for support.  Ask people who smoke to avoid smoking around you.  Avoid places that trigger you to smoke, such as bars, parties, or smoke-break areas at work.  Spend time with people who do not smoke.  Lessen the stress in your life. Stress can be a smoking trigger for some people. To lessen stress, try: ? Exercising regularly. ? Doing deep-breathing exercises. ? Doing yoga. ? Meditating. ? Performing a body scan. This involves closing your eyes, scanning your body from head to toe, and noticing which parts of your body are particularly tense. Try to relax the muscles in those areas. How will I feel when I quit smoking? Day 1 to 3 weeks Within the first 24 hours of quitting smoking, you may start to feel withdrawal symptoms. These symptoms are usually most noticeable 2-3 days after quitting, but they usually do not last for more than 2-3 weeks. You may experience these symptoms:  Mood swings.  Restlessness, anxiety, or irritability.  Trouble concentrating.  Dizziness.  Strong cravings for sugary foods and nicotine.  Mild weight gain.  Constipation.  Nausea.  Coughing or a sore throat.  Changes in how the medicines that you take for unrelated issues work in your body.  Depression.  Trouble sleeping (insomnia). Week 3 and afterward After the first 2-3 weeks of quitting, you may start to notice more positive results, such as:  Improved sense of smell and taste.  Decreased coughing and sore throat.  Slower heart rate.  Lower blood pressure.  Clearer skin.  The ability to breathe more easily.  Fewer sick days. Quitting smoking can be very challenging. Do not get discouraged if you  are not successful the first time. Some people need to make many attempts to quit before they achieve long-term success. Do your best to stick to your quit plan, and talk with your health care provider if you have any questions or concerns. Summary  Smoking tobacco is the leading cause of preventable death. Quitting smoking is one of the best things that you can do for your health.  When you decide to quit smoking, create a plan to help you succeed.  Quit smoking right away, not slowly over a period of time.  When you start quitting, seek help from your health care provider, family, or friends. This information is not intended to replace advice given to you by your health care provider. Make sure you discuss any questions you have with your health care provider. Document Revised: 10/21/2018 Document Reviewed: 04/16/2018 Elsevier Patient Education  [employer].   If you have lab work done today you will be contacted with your lab results within  the next 2 weeks.  If you have not heard from us then please contact us. The fastest way to get your results is to register for My Chart.   IF you received an x-ray today, you will receive an invoice from [facility]. Please contact [facility] at [phone] with questions or concerns regarding your invoice.   IF you received labwork today, you will receive an invoice from [facility]. Please contact [facility] at [phone] with questions or concerns regarding your invoice.   Our billing staff will not be able to assist you with questions regarding bills from these companies.  You will be contacted with the lab results as soon as they are available. The fastest way to get your results is to activate your My Chart account. Instructions are located on the last page of this paperwork. If you have not heard from us regarding the results in 2 weeks, please contact this office.         Signed, Meredith StaggersJeffrey Sharon Rubis, MD [facility]

## 2019-03-03 NOTE — Patient Instructions (Addendum)
I will refer you for lung cancer screening, but call cardiology for appointment (Dr. Bettina Gavia) to discuss the shortness of breath as you are due for follow up as well.   Follow up in next 3 weeks to discuss cough. pepcid over the counter if needed for heartburn, but try to avoid triggers. Heartburn can trigger cough.   1-800 quit-now  To help with quitting vaping. We can discuss further next visit as well.   Thanks for coming in today. Return to the clinic or go to the nearest emergency room if any of your symptoms worsen or new symptoms occur.     Food Choices for Gastroesophageal Reflux Disease, Adult When you have gastroesophageal reflux disease (GERD), the foods you eat and your eating habits are very important. Choosing the right foods can help ease the discomfort of GERD. Consider working with a diet and nutrition specialist (dietitian) to help you make healthy food choices. What general guidelines should I follow?  Eating plan  Choose healthy foods low in fat, such as fruits, vegetables, whole grains, low-fat dairy products, and lean meat, fish, and poultry.  Eat frequent, small meals instead of three large meals each day. Eat your meals slowly, in a relaxed setting. Avoid bending over or lying down until 2-3 hours after eating.  Limit high-fat foods such as fatty meats or fried foods.  Limit your intake of oils, butter, and shortening to less than 8 teaspoons each day.  Avoid the following: ? Foods that cause symptoms. These may be different for different people. Keep a food diary to keep track of foods that cause symptoms. ? Alcohol. ? Drinking large amounts of liquid with meals. ? Eating meals during the 2-3 hours before bed.  Cook foods using methods other than frying. This may include baking, grilling, or broiling. Lifestyle  Maintain a healthy weight. Ask your health care provider what weight is healthy for you. If you need to lose weight, work with your health care  provider to do so safely.  Exercise for at least 30 minutes on 5 or more days each week, or as told by your health care provider.  Avoid wearing clothes that fit tightly around your waist and chest.  Do not use any products that contain nicotine or tobacco, such as cigarettes and e-cigarettes. If you need help quitting, ask your health care provider.  Sleep with the head of your bed raised. Use a wedge under the mattress or blocks under the bed frame to raise the head of the bed. What foods are not recommended? The items listed may not be a complete list. Talk with your dietitian about what dietary choices are best for you. Grains Pastries or quick breads with added fat. [place] toast. Vegetables Deep fried vegetables. [place] fries. Any vegetables prepared with added fat. Any vegetables that cause symptoms. For some people this may include tomatoes and tomato products, chili peppers, onions and garlic, and horseradish. Fruits Any fruits prepared with added fat. Any fruits that cause symptoms. For some people this may include citrus fruits, such as oranges, grapefruit, pineapple, and lemons. Meats and other protein foods High-fat meats, such as fatty beef or pork, hot dogs, ribs, ham, sausage, salami and bacon. Fried meat or protein, including fried fish and fried chicken. Nuts and nut butters. Dairy Whole milk and chocolate milk. Sour cream. Cream. Ice cream. Cream cheese. Milk shakes. Beverages Coffee and tea, with or without caffeine. Carbonated beverages. Sodas. Energy drinks. Fruit juice made with acidic fruits (such  as orange or grapefruit). Tomato juice. Alcoholic drinks. Fats and oils Butter. Margarine. Shortening. Ghee. Sweets and desserts Chocolate and cocoa. Donuts. Seasoning and other foods Pepper. Peppermint and spearmint. Any condiments, herbs, or seasonings that cause symptoms. For some people, this may include curry, hot sauce, or vinegar-based salad  dressings. Summary  When you have gastroesophageal reflux disease (GERD), food and lifestyle choices are very important to help ease the discomfort of GERD.  Eat frequent, small meals instead of three large meals each day. Eat your meals slowly, in a relaxed setting. Avoid bending over or lying down until 2-3 hours after eating.  Limit high-fat foods such as fatty meat or fried foods. This information is not intended to replace advice given to you by your health care provider. Make sure you discuss any questions you have with your health care provider. Document Revised: 05/19/2018 Document Reviewed: 01/28/2016 Elsevier Patient Education  2020 [employer].   Steps to Quit Smoking Smoking tobacco is the leading cause of preventable death. It can affect almost every organ in the body. Smoking puts you and those around you at risk for developing many serious chronic diseases. Quitting smoking can be difficult, but it is one of the best things that you can do for your health. It is never too late to quit. How do I get ready to quit? When you decide to quit smoking, create a plan to help you succeed. Before you quit:  Pick a date to quit. Set a date within the next 2 weeks to give you time to prepare.  Write down the reasons why you are quitting. Keep this list in places where you will see it often.  Tell your family, friends, and co-workers that you are quitting. Support from your loved ones can make quitting easier.  Talk with your health care provider about your options for quitting smoking.  Find out what treatment options are covered by your health insurance.  Identify people, places, things, and activities that make you want to smoke (triggers). Avoid them. What first steps can I take to quit smoking?  Throw away all cigarettes at home, at work, and in your car.  Throw away smoking accessories, such as Set designer.  Clean your car. Make sure to empty the  ashtray.  Clean your home, including curtains and carpets. What strategies can I use to quit smoking? Talk with your health care provider about combining strategies, such as taking medicines while you are also receiving in-person counseling. Using these two strategies together makes you more likely to succeed in quitting than if you used either strategy on its own.  If you are pregnant or breastfeeding, talk with your health care provider about finding counseling or other support strategies to quit smoking. Do not take medicine to help you quit smoking unless your health care provider tells you to do so. To quit smoking: Quit right away  Quit smoking completely, instead of gradually reducing how much you smoke over a period of time. Research shows that stopping smoking right away is more successful than gradually quitting.  Attend in-person counseling to help you build problem-solving skills. You are more likely to succeed in quitting if you attend counseling sessions regularly. Even short sessions of 10 minutes can be effective. Take medicine You may take medicines to help you quit smoking. Some medicines require a prescription and some you can purchase over-the-counter. Medicines may have nicotine in them to replace the nicotine in cigarettes. Medicines may:  Help to  stop cravings.  Help to relieve withdrawal symptoms. Your health care provider may recommend:  Nicotine patches, gum, or lozenges.  Nicotine inhalers or sprays.  Non-nicotine medicine that is taken by mouth. Find resources Find resources and support systems that can help you to quit smoking and remain smoke-free after you quit. These resources are most helpful when you use them often. They include:  Online chats with a Veterinary surgeon.  Telephone quitlines.  Printed Materials engineer.  Support groups or group counseling.  Text messaging programs.  Mobile phone apps or applications. Use apps that can help you stick to  your quit plan by providing reminders, tips, and encouragement. There are many free apps for mobile devices as well as websites. Examples include Quit Guide from the [employer] and smokefree.gov What things can I do to make it easier to quit?   Reach out to your family and friends for support and encouragement. Call telephone quitlines (1-800-QUIT-NOW), reach out to support groups, or work with a counselor for support.  Ask people who smoke to avoid smoking around you.  Avoid places that trigger you to smoke, such as bars, parties, or smoke-break areas at work.  Spend time with people who do not smoke.  Lessen the stress in your life. Stress can be a smoking trigger for some people. To lessen stress, try: ? Exercising regularly. ? Doing deep-breathing exercises. ? Doing yoga. ? Meditating. ? Performing a body scan. This involves closing your eyes, scanning your body from head to toe, and noticing which parts of your body are particularly tense. Try to relax the muscles in those areas. How will I feel when I quit smoking? Day 1 to 3 weeks Within the first 24 hours of quitting smoking, you may start to feel withdrawal symptoms. These symptoms are usually most noticeable 2-3 days after quitting, but they usually do not last for more than 2-3 weeks. You may experience these symptoms:  Mood swings.  Restlessness, anxiety, or irritability.  Trouble concentrating.  Dizziness.  Strong cravings for sugary foods and nicotine.  Mild weight gain.  Constipation.  Nausea.  Coughing or a sore throat.  Changes in how the medicines that you take for unrelated issues work in your body.  Depression.  Trouble sleeping (insomnia). Week 3 and afterward After the first 2-3 weeks of quitting, you may start to notice more positive results, such as:  Improved sense of smell and taste.  Decreased coughing and sore throat.  Slower heart rate.  Lower blood pressure.  Clearer skin.  The ability  to breathe more easily.  Fewer sick days. Quitting smoking can be very challenging. Do not get discouraged if you are not successful the first time. Some people need to make many attempts to quit before they achieve long-term success. Do your best to stick to your quit plan, and talk with your health care provider if you have any questions or concerns. Summary  Smoking tobacco is the leading cause of preventable death. Quitting smoking is one of the best things that you can do for your health.  When you decide to quit smoking, create a plan to help you succeed.  Quit smoking right away, not slowly over a period of time.  When you start quitting, seek help from your health care provider, family, or friends. This information is not intended to replace advice given to you by your health care provider. Make sure you discuss any questions you have with your health care provider. Document Revised: 10/21/2018 Document Reviewed:  04/16/2018 Elsevier Patient Education  [employer].   If you have lab work done today you will be contacted with your lab results within the next 2 weeks.  If you have not heard from [place] then please contact us. The fastest way to get your results is to register for My Chart.   IF you received an x-ray today, you will receive an invoice from [facility]. Please contact [facility] at [phone] with questions or concerns regarding your invoice.   IF you received labwork today, you will receive an invoice from [facility]. Please contact [facility] at [phone] with questions or concerns regarding your invoice.   Our billing staff will not be able to assist you with questions regarding bills from these companies.  You will be contacted with the lab results as soon as they are available. The fastest way to get your results is to activate your My Chart account. Instructions are located on the last page of this paperwork. If you have not heard from [place]  regarding the results in 2 weeks, please contact this office.

## 2019-03-04 ENCOUNTER — Encounter: Payer: Self-pay | Admitting: Family Medicine

## 2019-03-04 LAB — COMPREHENSIVE METABOLIC PANEL
ALT: 16 IU/L (ref 0–44)
AST: 21 IU/L (ref 0–40)
Albumin/Globulin Ratio: 1.7 (ref 1.2–2.2)
Albumin: 4.4 g/dL (ref 3.8–4.8)
Alkaline Phosphatase: 62 IU/L (ref 39–117)
BUN/Creatinine Ratio: 15 (ref 10–24)
BUN: 16 mg/dL (ref 8–27)
Bilirubin Total: 0.2 mg/dL (ref 0.0–1.2)
CO2: 26 mmol/L (ref 20–29)
Calcium: 9.2 mg/dL (ref 8.6–10.2)
Chloride: 103 mmol/L (ref 96–106)
Creatinine, Ser: 1.1 mg/dL (ref 0.76–1.27)
GFR calc Af Amer: 82 mL/min/{1.73_m2} (ref >59)
GFR calc non Af Amer: 71 mL/min/{1.73_m2} (ref >59)
Globulin, Total: 2.6 g/dL (ref 1.5–4.5)
Glucose: 100 mg/dL — ABNORMAL HIGH (ref 65–99)
Potassium: 4.3 mmol/L (ref 3.5–5.2)
Sodium: 142 mmol/L (ref 134–144)
Total Protein: 7 g/dL (ref 6.0–8.5)

## 2019-03-04 LAB — LIPID PANEL
Chol/HDL Ratio: 2.7 ratio (ref 0.0–5.0)
Cholesterol, Total: 200 mg/dL — ABNORMAL HIGH (ref 100–199)
HDL: 75 mg/dL (ref >39)
LDL Chol Calc (NIH): 112 mg/dL — ABNORMAL HIGH (ref 0–99)
Triglycerides: 72 mg/dL (ref 0–149)
VLDL Cholesterol Cal: 13 mg/dL (ref 5–40)

## 2019-03-04 LAB — PRO B NATRIURETIC PEPTIDE: NT-Pro BNP: 56 pg/mL (ref 0–210)

## 2019-03-09 ENCOUNTER — Other Ambulatory Visit: Payer: Self-pay | Admitting: *Deleted

## 2019-03-09 DIAGNOSIS — Z87891 Personal history of nicotine dependence: Secondary | ICD-10-CM

## 2019-03-24 ENCOUNTER — Ambulatory Visit: Payer: BC Managed Care – PPO | Admitting: Family Medicine

## 2019-03-27 ENCOUNTER — Other Ambulatory Visit: Payer: Self-pay

## 2019-03-27 ENCOUNTER — Ambulatory Visit (INDEPENDENT_AMBULATORY_CARE_PROVIDER_SITE_OTHER): Payer: BC Managed Care – PPO | Admitting: Acute Care

## 2019-03-27 ENCOUNTER — Encounter: Payer: Self-pay | Admitting: Acute Care

## 2019-03-27 ENCOUNTER — Ambulatory Visit
Admission: RE | Admit: 2019-03-27 | Discharge: 2019-03-27 | Disposition: A | Discharge status: Home / Self Care | Payer: BC Managed Care – PPO | Source: Ambulatory Visit | Attending: Acute Care | Admitting: Acute Care

## 2019-03-27 VITALS — BP 122/60 | HR 107 | Temp 98.1°F | Ht 69.0 in | Wt 165.0 lb

## 2019-03-27 DIAGNOSIS — Z87891 Personal history of nicotine dependence: Secondary | ICD-10-CM

## 2019-03-27 NOTE — Progress Notes (Signed)
Shared Decision Making Visit Lung Cancer Screening Program [phone])   Eligibility:  Age 64 y.o.  Pack Years Smoking History Calculation 30 pack years (# packs/per year x # years smoked)  Recent History of coughing up blood  no  Unexplained weight loss? no ( >Than 15 pounds within the last 6 months )  Prior History Lung / other cancer no (Diagnosis within the last 5 years already requiring surveillance chest CT Scans).  Smoking Status Former Smoker  Former Smokers: Years since quit: 7 years  Quit Date: 2013  Visit Components:  Discussion included one or more decision making aids. yes  Discussion included risk/benefits of screening. yes  Discussion included potential follow up diagnostic testing for abnormal scans. yes  Discussion included meaning and risk of over diagnosis. yes  Discussion included meaning and risk of False Positives. yes  Discussion included meaning of total radiation exposure. yes  Counseling Included:  Importance of adherence to annual lung cancer LDCT screening. yes  Impact of comorbidities on ability to participate in the program. yes  Ability and willingness to under diagnostic treatment. yes  Smoking Cessation Counseling:  Current Smokers:   Discussed importance of smoking cessation. yes  Information about tobacco cessation classes and interventions provided to patient. yes  Patient provided with "ticket" for LDCT Scan. yes  Symptomatic Patient. no  Counseling  Diagnosis Code: Tobacco Use Z72.0  Asymptomatic Patient yes  Counseling (Intermediate counseling: > three minutes counseling) F5732  Former Smokers:   Discussed the importance of maintaining cigarette abstinence. yes  Diagnosis Code: Personal History of Nicotine Dependence. K02.542  Information about tobacco cessation classes and interventions provided to patient. Yes  Patient provided with "ticket" for LDCT Scan. yes  Written Order for Lung Cancer Screening with  LDCT placed in Epic. Yes (CT Chest Lung Cancer Screening Low Dose W/O CM) [id number] Z12.2-Screening of respiratory organs Z87.891-Personal history of nicotine dependence  BP 122/60 (BP Location: Left Arm, Cuff Size: Normal)   Pulse (!) 107   Temp 98.1 F (36.7 C) (Temporal)   Ht 5\' 9"  (1.753 m)   Wt 165 lb (74.8 kg)   SpO2 99%   BMI 24.37 kg/m    I spent 25 minutes of face to face time with Ms. Coggin discussing the risks and benefits of lung cancer screening. We viewed a power point together that explained in detail the above noted topics. We took the time to pause the power point at intervals to allow for questions to be asked and answered to ensure understanding. We discussed that he had taken the single most powerful action possible to decrease his risk of developing lung cancer when he quit smoking. I counseled him to remain smoke free, and to contact me if he ever had the desire to smoke again so that I can provide resources and tools to help support the effort to remain smoke free. We discussed the time and location of the scan, and that either  RN or I will call with the results within  24-48 hours of receiving them. He has my card and contact information in the event he needs to speak with me, in addition to a copy of the power point we reviewed as a resource. He verbalized understanding of all of the above and had no further questions upon leaving the office.     I explained to the patient that there has been a high incidence of coronary artery disease noted on these exams. I explained that this is  a non-gated exam therefore degree or severity cannot be determined. This patient is on statin therapy. I have asked the patient to follow-up with their PCP regarding any incidental finding of coronary artery disease and management with diet or medication as they feel is clinically indicated. The patient verbalized understanding of the above and had no further  questions.     Magdalen Spatz, NP 03/27/2019 3:59 PM

## 2019-03-27 NOTE — Patient Instructions (Signed)
Thank you for participating in the [place] [facility] Cancer Screening Program. It was our pleasure to meet you today. We will call you with the results of your scan within the next few days. Your scan will be assigned a Lung RADS category score by the physicians reading the scans.  This Lung RADS score determines follow up scanning.  See below for description of categories, and follow up screening recommendations. We will be in touch to schedule your follow up screening annually or based on recommendations of our providers. We will fax a copy of your scan results to your Primary Care Physician, or the physician who referred you to the program, to ensure they have the results. Please call the office if you have any questions or concerns regarding your scanning experience or results.  Our office number is [phone]. Please speak with Denise Phelps, RN. She is our Lung Cancer Screening RN. If she is unavailable when you call, please have the office staff send her a message. She will return your call at her earliest convenience. Remember, if your scan is normal, we will scan you annually as long as you continue to meet the criteria for the program. (Age 55-77, Current smoker or smoker who has quit within the last 15 years). If you are a smoker, remember, quitting is the single most powerful action that you can take to decrease your risk of lung cancer and other pulmonary, breathing related problems. We know quitting is hard, and we are here to help.  Please let us know if there is anything we can do to help you meet your goal of quitting. If you are a former smoker, congratulations. We are proud of you! Remain smoke free! Remember you can refer friends or family members through the number above.  We will screen them to make sure they meet criteria for the program. Thank you for helping us take better care of you by participating in Lung Screening.  Lung RADS Categories:  Lung RADS 1: no nodules  or definitely non-concerning nodules.  Recommendation is for a repeat annual scan in 12 months.  Lung RADS 2:  nodules that are non-concerning in appearance and behavior with a very low likelihood of becoming an active cancer. Recommendation is for a repeat annual scan in 12 months.  Lung RADS 3: nodules that are probably non-concerning , includes nodules with a low likelihood of becoming an active cancer.  Recommendation is for a 6-month repeat screening scan. Often noted after an upper respiratory illness. We will be in touch to make sure you have no questions, and to schedule your 6-month scan.  Lung RADS 4 A: nodules with concerning findings, recommendation is most often for a follow up scan in 3 months or additional testing based on our provider's assessment of the scan. We will be in touch to make sure you have no questions and to schedule the recommended 3 month follow up scan.  Lung RADS 4 B:  indicates findings that are concerning. We will be in touch with you to schedule additional diagnostic testing based on our provider's  assessment of the scan.   

## 2019-03-29 ENCOUNTER — Ambulatory Visit: Payer: BC Managed Care – PPO | Admitting: Family Medicine

## 2019-03-29 ENCOUNTER — Encounter: Payer: Self-pay | Admitting: Family Medicine

## 2019-03-29 ENCOUNTER — Other Ambulatory Visit: Payer: Self-pay

## 2019-03-29 VITALS — BP 118/70 | HR 55 | Temp 98.2°F | Ht 69.0 in | Wt 161.0 lb

## 2019-03-29 DIAGNOSIS — E785 Hyperlipidemia, unspecified: Secondary | ICD-10-CM

## 2019-03-29 DIAGNOSIS — I10 Essential (primary) hypertension: Secondary | ICD-10-CM

## 2019-03-29 DIAGNOSIS — R0789 Other chest pain: Secondary | ICD-10-CM

## 2019-03-29 DIAGNOSIS — R059 Cough, unspecified: Secondary | ICD-10-CM

## 2019-03-29 DIAGNOSIS — K219 Gastro-esophageal reflux disease without esophagitis: Secondary | ICD-10-CM

## 2019-03-29 DIAGNOSIS — Z23 Encounter for immunization: Secondary | ICD-10-CM

## 2019-03-29 DIAGNOSIS — R42 Dizziness and giddiness: Secondary | ICD-10-CM

## 2019-03-29 DIAGNOSIS — R05 Cough: Secondary | ICD-10-CM

## 2019-03-29 MED ORDER — OMEPRAZOLE 20 MG PO CPDR: 20.0000 mg | DELAYED_RELEASE_CAPSULE | Freq: Every day | ORAL | 1 refills | Status: DC

## 2019-03-29 MED ORDER — LISINOPRIL-HYDROCHLOROTHIAZIDE 20-12.5 MG PO TABS: 2.0000 | ORAL_TABLET | Freq: Every day | ORAL | 3 refills | Status: DC

## 2019-03-29 MED ORDER — ROSUVASTATIN CALCIUM 5 MG PO TABS: 5.0000 mg | ORAL_TABLET | Freq: Every day | ORAL | 1 refills | Status: DC

## 2019-03-29 NOTE — Progress Notes (Signed)
Please call patient and let them  know their  low dose Ct was read as a Lung RADS 2: nodules that are benign in appearance and behavior with a very low likelihood of becoming a clinically active cancer due to size or lack of growth. Recommendation per radiology is for a repeat LDCT in 12 months. Please let them  know we will order and schedule their  annual screening scan for 03/2020. Please let them  know there was notation of CAD on their  scan.  Please remind the patient  that this is a non-gated exam therefore degree or severity of disease  cannot be determined. Please have them  follow up with their PCP regarding potential risk factor modification, dietary therapy or pharmacologic therapy if clinically indicated. Pt.  is  currently on statin therapy. Please place order for annual  screening scan for  03/2020 and fax results to PCP. Thanks so much. 

## 2019-03-29 NOTE — Progress Notes (Signed)
Subjective:  Patient ID: Albert Council., male    DOB: [birth year]  Age: 64 y.o. MRN: [id number]  CC:  Chief Complaint  Patient presents with  . Follow-up     on cough and hypertension. ptstates cough is the same as last visit. pt states his BP has been doing well no changes since last visit. pt states he getts dizzy some times.medication are working welling with no side effects.    HPI Albert Hardy. presents for    Cough: Follow-up from establish care visit in January. Cough discussed at that time, reassuring chest x-ray, no acute findings on EKG.  Previous aortic stenosis was stable.  Possible deconditioning component but recommended follow-up with cardiology.  BNP obtained.  Also discussed possible reflux component, trigger avoidance and Pepcid over-the-counter discussed. BNP normal.  cxr clear. Cardiologist Dr. Dulce Sellar.  Appointment March 9.  Cough about the same. Dry cough usually. No fever. Min dyspnea at times, chest tight at times - similar past 6 months. At rest - not exertion. Possibly more frequent  past month. Notes after eating at times. Dizziness with standing up quickly only - resolves quickly - few mins, no syncope. No symptoms with activity.  Occasional heartburn, no diet changes or pepcid since last visit.  Moderate aortic regurg, mild stenosis on echo last year.   Hyperlipidemia: crestor 5mg  - recent testing slight elevated. Had been taking 1 pill every other day. Now 2 days on, 1 day off -no new myalgias.  Lab Results  Component Value Date   CHOL 200 (H) 03/03/2019   HDL 75 03/03/2019   LDLCALC 112 (H) 03/03/2019   TRIG 72 03/03/2019   CHOLHDL 2.7 03/03/2019   Lab Results  Component Value Date   ALT 16 03/03/2019   AST 21 03/03/2019   ALKPHOS 62 03/03/2019   BILITOT 0.2 03/03/2019   Hypertension: Dizziness with standing quickly.  Taking total 40mg  lisinopril, 25mg  hctz qd, amlodipine 5mg  qd.  Home readings:120/68 BP Readings from Last 3 Encounters:    03/29/19 118/70  03/27/19 122/60  03/03/19 118/66   Lab Results  Component Value Date   CREATININE 1.10 03/03/2019        History Patient Active Problem List   Diagnosis Date Noted  . Aortic stenosis 04/04/2018  . Atherosclerosis of native arteries of the extremities with intermittent claudication 09/08/2013  . Former moderate cigarette smoker (10-19 per day) 05/20/2012  . Peripheral vascular disease with claudication   . Hyperlipidemia   . Aortic regurgitation   . GERD (gastroesophageal reflux disease)   . Hypertension    Past Medical History:  Diagnosis Date  . Aortic regurgitation   . Atherosclerosis of native arteries of the extremities with intermittent claudication 09/08/2013  . GERD (gastroesophageal reflux disease)   . Heart murmur   . Hyperlipidemia   . Hypertension   . Peripheral vascular disease with claudication    ABI .49    . Substance abuse [facility])    Past Surgical History:  Procedure Laterality Date  . NO PAST SURGERIES     Allergies  Allergen Reactions  . Lipitor [Atorvastatin]     myalgia  . Pravastatin Other (See Comments)    myalgia   Prior to Admission medications   Medication Sig Start Date End Date Taking? Authorizing Provider  amLODipine (NORVASC) 5 MG tablet TAKE ONE TABLET BY MOUTH ONCE DAILY 04/04/18  Yes Baldo Daub, MD  aspirin 81 MG tablet Take 81 mg by mouth daily.   Yes  [provider]  Cholecalciferol (VITAMIN D3) 1000 UNITS CAPS Take 1 capsule by mouth daily.   Yes [provider]  Coenzyme Q10 (CO Q 10 PO) Take 1 tablet by mouth daily.   Yes [provider]  Cyanocobalamin (VITAMIN B-12 PO) 1 tablet daily.   Yes [provider]  ibuprofen (ADVIL,MOTRIN) 200 MG tablet Take 200 mg by mouth every 6 (six) hours as needed.   Yes [provider]  KRILL OIL PO Take 1 tablet by mouth daily.   Yes [provider]  lisinopril-hydrochlorothiazide (PRINZIDE,ZESTORETIC) 20-12.5 MG  tablet Take 2 tablets by mouth daily. 04/04/18  Yes Baldo DaubMunley, Brian J, MD  MAGNESIUM PO Take 1 tablet by mouth daily.   Yes [provider]  Multiple Vitamins-Minerals (CENTRUM SILVER PO) Take 1 tablet by mouth daily.   Yes [provider]  Multiple Vitamins-Minerals (ZINC PO) Take 1 tablet by mouth daily.   Yes [provider]  rosuvastatin (CRESTOR) 5 MG tablet Take 1 tablet every other night at bedtime. 04/04/18  Yes Baldo DaubMunley, Brian J, MD  TURMERIC PO Take 1 tablet by mouth daily.    Yes [provider]   Social History   Socioeconomic History  . Marital status: Married    Spouse name: Not on file  . Number of children: Not on file  . Years of education: Not on file  . Highest education level: Not on file  Occupational History  . Occupation: Recruitment consultantAutomobile Detailer  Tobacco Use  . Smoking status: Former Smoker    Packs/day: 1.00    Years: 30.00    Pack years: 30.00    Types: Cigarettes    Quit date: 12/21/2011    Years since quitting: 7.2  . Smokeless tobacco: Never Used  Substance and Sexual Activity  . Alcohol use: Yes    Comment: Ocassionaly.   . Drug use: Not Currently    Frequency: 1.0 times per week    Types: Marijuana  . Sexual activity: Yes    Birth control/protection: None  Other Topics Concern  . Not on file  Social History Narrative   Married. Education: [employer]. Exercise:  "Somewhat".   Social Determinants of Health   Financial Resource Strain:   . Difficulty of Paying Living Expenses: Not on file  Food Insecurity:   . Worried About Programme researcher, broadcasting/film/videounning Out of Food in the Last Year: Not on file  . Ran Out of Food in the Last Year: Not on file  Transportation Needs:   . Lack of Transportation (Medical): Not on file  . Lack of Transportation (Non-Medical): Not on file  Physical Activity:   . Days of Exercise per Week: Not on file  . Minutes of Exercise per Session: Not on file  Stress:   . Feeling of Stress : Not on file  Social  Connections:   . Frequency of Communication with Friends and Family: Not on file  . Frequency of Social Gatherings with Friends and Family: Not on file  . Attends Religious Services: Not on file  . Active Member of Clubs or Organizations: Not on file  . Attends BankerClub or Organization Meetings: Not on file  . Marital Status: Not on file  Intimate Partner Violence:   . Fear of Current or Ex-Partner: Not on file  . Emotionally Abused: Not on file  . Physically Abused: Not on file  . Sexually Abused: Not on file    Review of Systems  Per HPI Objective:   Vitals:   03/29/19  1337 03/29/19 1452  BP: 107/68 118/70  Pulse: 73 (!) 55  Temp: 98.2 F (36.8 C)   TempSrc: Temporal   SpO2: 99%   Weight: 161 lb (73 kg)   Height: 5\' 9"  (1.753 m)      Physical Exam Vitals reviewed.  Constitutional:      Appearance: He is well-developed.  HENT:     Head: Normocephalic and atraumatic.  Eyes:     Pupils: Pupils are equal, round, and reactive to light.  Neck:     Vascular: No carotid bruit or JVD.  Cardiovascular:     Rate and Rhythm: Normal rate and regular rhythm.     Heart sounds: Normal heart sounds. No murmur.  Pulmonary:     Effort: Pulmonary effort is normal.     Breath sounds: Normal breath sounds. No rales.  Musculoskeletal:     Right lower leg: No edema.     Left lower leg: No edema.  Skin:    General: Skin is warm and dry.  Neurological:     Mental Status: He is alert and oriented to person, place, and time.  Psychiatric:        Mood and Affect: Mood normal.        Behavior: Behavior normal.    Orthostatic VS for the past 24 hrs (Last 3 readings):  BP- Lying Pulse- Lying BP- Sitting Pulse- Sitting BP- Standing at 0 minutes Pulse- Standing at 0 minutes BP- Standing at 3 minutes Pulse- Standing at 3 minutes  03/29/19 1452 118/70 55 110/76 52 130/70 55 114/76 58     Assessment & Plan:  Albert Branden. is a 64 y.o. male . Hyperlipidemia, unspecified hyperlipidemia  type - Plan: lisinopril-hydrochlorothiazide (ZESTORETIC) 20-12.5 MG tablet, rosuvastatin (CRESTOR) 5 MG tablet  -Dosing options of Crestor discussed, can try up to 10 mg daily.  Need for prophylactic vaccination and inoculation against influenza - Plan: Flu Vaccine QUAD 6+ mos PF IM (Fluarix Quad PF)  Essential hypertension - Plan: Ambulatory referral to Cardiology Episode of dizziness - Plan: Ambulatory referral to Cardiology  -Controlled blood pressure, possibly slightly low with orthostatic description of dizziness, episodic dizziness.  Not orthostatic in office.  Can temporarily hold amlodipine for now, continue lisinopril HCTZ same dose, monitor home readings with restart of amlodipine if elevated.  Sensation of chest tightness - Plan: Ambulatory referral to Cardiology  -Intermittent symptoms as above for some time, does report some increased symptoms, may have component of reflux.  Start omeprazole, advised to be seen by cardiology sooner, referral placed.  ER precautions/chest pain precautions given.  Gastroesophageal reflux disease, unspecified whether esophagitis present - Plan: omeprazole (PRILOSEC) 20 MG capsule Cough  -As above start omeprazole, could also explain cough with possible laryngeal pharyngeal reflux.  Trigger avoidance, with Pepcid before bedtime if needed.  Recheck 2 to 3 weeks if not improving, sooner if worse.  Meds ordered this encounter  Medications  . lisinopril-hydrochlorothiazide (ZESTORETIC) 20-12.5 MG tablet    Sig: Take 2 tablets by mouth daily.    Dispense:  180 tablet    Refill:  3  . rosuvastatin (CRESTOR) 5 MG tablet    Sig: Take 1 tablet (5 mg total) by mouth at bedtime.    Dispense:  90 tablet    Refill:  1  . omeprazole (PRILOSEC) 20 MG capsule    Sig: Take 1 capsule (20 mg total) by mouth daily.    Dispense:  30 capsule    Refill:  1  Patient Instructions     Increase Crestor to 10 mg once per day for improved cholesterol control.  If any  side effects of the medication please let me know.  I will refer you to Dr. Dulce Sellar to be seen sooner for the chest symptoms, but as we discussed that could be related to heartburn.  Start omeprazole once per day, Pepcid before bedtime, avoid foods listed below that can worsen heartburn.  Follow-up in the next 2 to 3 weeks if cough is not improving with this approach.  Sooner if worse.   Stop amlodipine for now. Keep a record of your blood pressures outside of the office and bring them to the next office visit. If running over 140/90 at home - restart amlodipine.   Return to the clinic or go to the nearest emergency room if any of your symptoms worsen or new symptoms occur.   How to Take Your Blood Pressure Blood pressure is a measurement of how strongly your blood is pressing against the walls of your arteries. Arteries are blood vessels that carry blood from your heart throughout your body. Your health care provider takes your blood pressure at each office visit. You can also take your own blood pressure at home with a blood pressure machine. You may need to take your own blood pressure:  To confirm a diagnosis of high blood pressure (hypertension).  To monitor your blood pressure over time.  To make sure your blood pressure medicine is working. Supplies needed: To take your blood pressure, you will need a blood pressure machine. You can buy a blood pressure machine, or blood pressure monitor, at most drugstores or online. There are several types of home blood pressure monitors. When choosing one, consider the following:  Choose a monitor that has an arm cuff.  Choose a cuff that wraps snugly around your upper arm. You should be able to fit only one finger between your arm and the cuff.  Do not choose a monitor that measures your blood pressure from your wrist or finger. Your health care provider can suggest a reliable monitor that will meet your needs. How to prepare To get the most  accurate reading, avoid the following for 30 minutes before you check your blood pressure:  Drinking caffeine.  Drinking alcohol.  Eating.  Smoking.  Exercising. Five minutes before you check your blood pressure:  Empty your bladder.  Sit quietly without talking in a dining chair, rather than in a soft couch or armchair. How to take your blood pressure To check your blood pressure, follow the instructions in the manual that came with your blood pressure monitor. If you have a digital blood pressure monitor, the instructions may be as follows: 1. Sit up straight. 2. Place your feet on the floor. Do not cross your ankles or legs. 3. Rest your left arm at the level of your heart on a table or desk or on the arm of a chair. 4. Pull up your shirt sleeve. 5. Wrap the blood pressure cuff around the upper part of your left arm, 1 inch (2.5 cm) above your elbow. It is best to wrap the cuff around bare skin. 6. Fit the cuff snugly around your arm. You should be able to place only one finger between the cuff and your arm. 7. Position the cord inside the groove of your elbow. 8. Press the power button. 9. Sit quietly while the cuff inflates and deflates. 10. Read the digital reading on the monitor screen  and write it down (record it). 11. Wait 2-3 minutes, then repeat the steps, starting at step 1. What does my blood pressure reading mean? A blood pressure reading consists of a higher number over a lower number. Ideally, your blood pressure should be below 120/80. The first ("top") number is called the systolic pressure. It is a measure of the pressure in your arteries as your heart beats. The second ("bottom") number is called the diastolic pressure. It is a measure of the pressure in your arteries as the heart relaxes. Blood pressure is classified into four stages. The following are the stages for adults who do not have a short-term serious illness or a chronic condition. Systolic pressure and  diastolic pressure are measured in a unit called mm Hg. Normal  Systolic pressure: below 120.  Diastolic pressure: below 80. Elevated  Systolic pressure: 120-129.  Diastolic pressure: below 80. Hypertension stage 1  Systolic pressure: 130-139.  Diastolic pressure: 80-89. Hypertension stage 2  Systolic pressure: 140 or above.  Diastolic pressure: 90 or above. You can have prehypertension or hypertension even if only the systolic or only the diastolic number in your reading is higher than normal. Follow these instructions at home:  Check your blood pressure as often as recommended by your health care provider.  Take your monitor to the next appointment with your health care provider to make sure: ? That you are using it correctly. ? That it provides accurate readings.  Be sure you understand what your goal blood pressure numbers are.  Tell your health care provider if you are having any side effects from blood pressure medicine. Contact a health care provider if:  Your blood pressure is consistently high. Get help right away if:  Your systolic blood pressure is higher than 180.  Your diastolic blood pressure is higher than 110. This information is not intended to replace advice given to you by your health care provider. Make sure you discuss any questions you have with your health care provider. Document Revised: 01/08/2017 Document Reviewed: 07/05/2015 Elsevier Patient Education  2020 Elsevier Inc.  Cough, Adult Coughing is a reflex that clears your throat and your airways (respiratory system). Coughing helps to heal and protect your lungs. It is normal to cough occasionally, but a cough that happens with other symptoms or lasts a long time may be a sign of a condition that needs treatment. An acute cough may only last 2-3 weeks, while a chronic cough may last 8 or more weeks. Coughing is commonly caused by:  Infection of the respiratory systemby viruses or  bacteria.  Breathing in substances that irritate your lungs.  Allergies.  Asthma.  Mucus that runs down the back of your throat (postnasal drip).  Smoking.  Acid backing up from the stomach into the esophagus (gastroesophageal reflux).  Certain medicines.  Chronic lung problems.  Other medical conditions such as heart failure or a blood clot in the lung (pulmonary embolism). Follow these instructions at home: Medicines  Take over-the-counter and prescription medicines only as told by your health care provider.  Talk with your health care provider before you take a cough suppressant medicine. Lifestyle   Avoid cigarette smoke. Do not use any products that contain nicotine or tobacco, such as cigarettes, e-cigarettes, and chewing tobacco. If you need help quitting, ask your health care provider.  Drink enough fluid to keep your urine pale yellow.  Avoid caffeine.  Do not drink alcohol if your health care provider tells you not to drink.  General instructions   Pay close attention to changes in your cough. Tell your health care provider about them.  Always cover your mouth when you cough.  Avoid things that make you cough, such as perfume, candles, cleaning products, or campfire or tobacco smoke.  If the air is dry, use a cool mist vaporizer or humidifier in your bedroom or your home to help loosen secretions.  If your cough is worse at night, try to sleep in a semi-upright position.  Rest as needed.  Keep all follow-up visits as told by your health care provider. This is important. Contact a health care provider if you:  Have new symptoms.  Cough up pus.  Have a cough that does not get better after 2-3 weeks or gets worse.  Cannot control your cough with cough suppressant medicines and you are losing sleep.  Have pain that gets worse or pain that is not helped with medicine.  Have a fever.  Have unexplained weight loss.  Have night sweats. Get help  right away if:  You cough up blood.  You have difficulty breathing.  Your heartbeat is very fast. These symptoms may represent a serious problem that is an emergency. Do not wait to see if the symptoms will go away. Get medical help right away. Call your local emergency services (911 in the [place] Do not drive yourself to the hospital. Summary  Coughing is a reflex that clears your throat and your airways. It is normal to cough occasionally, but a cough that happens with other symptoms or lasts a long time may be a sign of a condition that needs treatment.  Take over-the-counter and prescription medicines only as told by your health care provider.  Always cover your mouth when you cough.  Contact a health care provider if you have new symptoms or a cough that does not get better after 2-3 weeks or gets worse. This information is not intended to replace advice given to you by your health care provider. Make sure you discuss any questions you have with your health care provider. Document Revised: 02/14/2018 Document Reviewed: 02/14/2018 Elsevier Patient Education  2020 [place] for Gastroesophageal Reflux Disease, Adult When you have gastroesophageal reflux disease (GERD), the foods you eat and your eating habits are very important. Choosing the right foods can help ease your discomfort. Think about working with a nutrition specialist (dietitian) to help you make good choices. What are tips for following this plan?  Meals  Choose healthy foods that are low in fat, such as fruits, vegetables, whole grains, low-fat dairy products, and lean meat, fish, and poultry.  Eat small meals often instead of 3 large meals a day. Eat your meals slowly, and in a place where you are relaxed. Avoid bending over or lying down until 2-3 hours after eating.  Avoid eating meals 2-3 hours before bed.  Avoid drinking a lot of liquid with meals.  Cook foods using methods other than  frying. Bake, grill, or broil food instead.  Avoid or limit: ? Chocolate. ? Peppermint or spearmint. ? Alcohol. ? Pepper. ? Black and decaffeinated coffee. ? Black and decaffeinated tea. ? Bubbly (carbonated) soft drinks. ? Caffeinated energy drinks and soft drinks.  Limit high-fat foods such as: ? Fatty meat or fried foods. ? Whole milk, cream, butter, or ice cream. ? Nuts and nut butters. ? Pastries, donuts, and sweets made with butter or shortening.  Avoid foods that cause symptoms. These foods may be different  for everyone. Common foods that cause symptoms include: ? Tomatoes. ? Oranges, lemons, and limes. ? Peppers. ? Spicy food. ? Onions and garlic. ? Vinegar. Lifestyle  Maintain a healthy weight. Ask your doctor what weight is healthy for you. If you need to lose weight, work with your doctor to do so safely.  Exercise for at least 30 minutes for 5 or more days each week, or as told by your doctor.  Wear loose-fitting clothes.  Do not smoke. If you need help quitting, ask your doctor.  Sleep with the head of your bed higher than your feet. Use a wedge under the mattress or blocks under the bed frame to raise the head of the bed. Summary  When you have gastroesophageal reflux disease (GERD), food and lifestyle choices are very important in easing your symptoms.  Eat small meals often instead of 3 large meals a day. Eat your meals slowly, and in a place where you are relaxed.  Limit high-fat foods such as fatty meat or fried foods.  Avoid bending over or lying down until 2-3 hours after eating.  Avoid peppermint and spearmint, caffeine, alcohol, and chocolate. This information is not intended to replace advice given to you by your health care provider. Make sure you discuss any questions you have with your health care provider. Document Revised: 05/19/2018 Document Reviewed: 03/03/2016 Elsevier Patient Education  [employer].   If you have lab work done  today you will be contacted with your lab results within the next 2 weeks.  If you have not heard from [place] then please contact us. The fastest way to get your results is to register for My Chart.   IF you received an x-ray today, you will receive an invoice from [facility]. Please contact [facility] at [phone] with questions or concerns regarding your invoice.   IF you received labwork today, you will receive an invoice from [facility]. Please contact [facility] at [phone] with questions or concerns regarding your invoice.   Our billing staff will not be able to assist you with questions regarding bills from these companies.  You will be contacted with the lab results as soon as they are available. The fastest way to get your results is to activate your My Chart account. Instructions are located on the last page of this paperwork. If you have not heard from [place] regarding the results in 2 weeks, please contact this office.         Signed, Meredith Staggers, MD [facility]

## 2019-03-29 NOTE — Patient Instructions (Addendum)
Increase Crestor to 10 mg once per day for improved cholesterol control.  If any side effects of the medication please let me know.  I will refer you to Dr. Dulce Sellar to be seen sooner for the chest symptoms, but as we discussed that could be related to heartburn.  Start omeprazole once per day, Pepcid before bedtime, avoid foods listed below that can worsen heartburn.  Follow-up in the next 2 to 3 weeks if cough is not improving with this approach.  Sooner if worse.   Stop amlodipine for now. Keep a record of your blood pressures outside of the office and bring them to the next office visit. If running over 140/90 at home - restart amlodipine.   Return to the clinic or go to the nearest emergency room if any of your symptoms worsen or new symptoms occur.   How to Take Your Blood Pressure Blood pressure is a measurement of how strongly your blood is pressing against the walls of your arteries. Arteries are blood vessels that carry blood from your heart throughout your body. Your health care provider takes your blood pressure at each office visit. You can also take your own blood pressure at home with a blood pressure machine. You may need to take your own blood pressure:  To confirm a diagnosis of high blood pressure (hypertension).  To monitor your blood pressure over time.  To make sure your blood pressure medicine is working. Supplies needed: To take your blood pressure, you will need a blood pressure machine. You can buy a blood pressure machine, or blood pressure monitor, at most drugstores or online. There are several types of home blood pressure monitors. When choosing one, consider the following:  Choose a monitor that has an arm cuff.  Choose a cuff that wraps snugly around your upper arm. You should be able to fit only one finger between your arm and the cuff.  Do not choose a monitor that measures your blood pressure from your wrist or finger. Your health care provider can  suggest a reliable monitor that will meet your needs. How to prepare To get the most accurate reading, avoid the following for 30 minutes before you check your blood pressure:  Drinking caffeine.  Drinking alcohol.  Eating.  Smoking.  Exercising. Five minutes before you check your blood pressure:  Empty your bladder.  Sit quietly without talking in a dining chair, rather than in a soft couch or armchair. How to take your blood pressure To check your blood pressure, follow the instructions in the manual that came with your blood pressure monitor. If you have a digital blood pressure monitor, the instructions may be as follows: 1. Sit up straight. 2. Place your feet on the floor. Do not cross your ankles or legs. 3. Rest your left arm at the level of your heart on a table or desk or on the arm of a chair. 4. Pull up your shirt sleeve. 5. Wrap the blood pressure cuff around the upper part of your left arm, 1 inch (2.5 cm) above your elbow. It is best to wrap the cuff around bare skin. 6. Fit the cuff snugly around your arm. You should be able to place only one finger between the cuff and your arm. 7. Position the cord inside the groove of your elbow. 8. Press the power button. 9. Sit quietly while the cuff inflates and deflates. 10. Read the digital reading on the monitor screen and write it down (record it).  11. Wait 2-3 minutes, then repeat the steps, starting at step 1. What does my blood pressure reading mean? A blood pressure reading consists of a higher number over a lower number. Ideally, your blood pressure should be below 120/80. The first ("top") number is called the systolic pressure. It is a measure of the pressure in your arteries as your heart beats. The second ("bottom") number is called the diastolic pressure. It is a measure of the pressure in your arteries as the heart relaxes. Blood pressure is classified into four stages. The following are the stages for adults who  do not have a short-term serious illness or a chronic condition. Systolic pressure and diastolic pressure are measured in a unit called mm Hg. Normal  Systolic pressure: below 202.  Diastolic pressure: below 80. Elevated  Systolic pressure: 542-706.  Diastolic pressure: below 80. Hypertension stage 1  Systolic pressure: 237-628.  Diastolic pressure: 31-51. Hypertension stage 2  Systolic pressure: 761 or above.  Diastolic pressure: 90 or above. You can have prehypertension or hypertension even if only the systolic or only the diastolic number in your reading is higher than normal. Follow these instructions at home:  Check your blood pressure as often as recommended by your health care provider.  Take your monitor to the next appointment with your health care provider to make sure: ? That you are using it correctly. ? That it provides accurate readings.  Be sure you understand what your goal blood pressure numbers are.  Tell your health care provider if you are having any side effects from blood pressure medicine. Contact a health care provider if:  Your blood pressure is consistently high. Get help right away if:  Your systolic blood pressure is higher than 180.  Your diastolic blood pressure is higher than 110. This information is not intended to replace advice given to you by your health care provider. Make sure you discuss any questions you have with your health care provider. Document Revised: 01/08/2017 Document Reviewed: 07/05/2015 Elsevier Patient Education  [place].  Cough, Adult Coughing is a reflex that clears your throat and your airways (respiratory system). Coughing helps to heal and protect your lungs. It is normal to cough occasionally, but a cough that happens with other symptoms or lasts a long time may be a sign of a condition that needs treatment. An acute cough may only last 2-3 weeks, while a chronic cough may last 8 or more  weeks. Coughing is commonly caused by:  Infection of the respiratory systemby viruses or bacteria.  Breathing in substances that irritate your lungs.  Allergies.  Asthma.  Mucus that runs down the back of your throat (postnasal drip).  Smoking.  Acid backing up from the stomach into the esophagus (gastroesophageal reflux).  Certain medicines.  Chronic lung problems.  Other medical conditions such as heart failure or a blood clot in the lung (pulmonary embolism). Follow these instructions at home: Medicines  Take over-the-counter and prescription medicines only as told by your health care provider.  Talk with your health care provider before you take a cough suppressant medicine. Lifestyle   Avoid cigarette smoke. Do not use any products that contain nicotine or tobacco, such as cigarettes, e-cigarettes, and chewing tobacco. If you need help quitting, ask your health care provider.  Drink enough fluid to keep your urine pale yellow.  Avoid caffeine.  Do not drink alcohol if your health care provider tells you not to drink. General instructions   Pay close  attention to changes in your cough. Tell your health care provider about them.  Always cover your mouth when you cough.  Avoid things that make you cough, such as perfume, candles, cleaning products, or campfire or tobacco smoke.  If the air is dry, use a cool mist vaporizer or humidifier in your bedroom or your home to help loosen secretions.  If your cough is worse at night, try to sleep in a semi-upright position.  Rest as needed.  Keep all follow-up visits as told by your health care provider. This is important. Contact a health care provider if you:  Have new symptoms.  Cough up pus.  Have a cough that does not get better after 2-3 weeks or gets worse.  Cannot control your cough with cough suppressant medicines and you are losing sleep.  Have pain that gets worse or pain that is not helped with  medicine.  Have a fever.  Have unexplained weight loss.  Have night sweats. Get help right away if:  You cough up blood.  You have difficulty breathing.  Your heartbeat is very fast. These symptoms may represent a serious problem that is an emergency. Do not wait to see if the symptoms will go away. Get medical help right away. Call your local emergency services (911 in the [place] Do not drive yourself to the hospital. Summary  Coughing is a reflex that clears your throat and your airways. It is normal to cough occasionally, but a cough that happens with other symptoms or lasts a long time may be a sign of a condition that needs treatment.  Take over-the-counter and prescription medicines only as told by your health care provider.  Always cover your mouth when you cough.  Contact a health care provider if you have new symptoms or a cough that does not get better after 2-3 weeks or gets worse. This information is not intended to replace advice given to you by your health care provider. Make sure you discuss any questions you have with your health care provider. Document Revised: 02/14/2018 Document Reviewed: 02/14/2018 Elsevier Patient Education  2020 [employer].  Food Choices for Gastroesophageal Reflux Disease, Adult When you have gastroesophageal reflux disease (GERD), the foods you eat and your eating habits are very important. Choosing the right foods can help ease your discomfort. Think about working with a nutrition specialist (dietitian) to help you make good choices. What are tips for following this plan?  Meals  Choose healthy foods that are low in fat, such as fruits, vegetables, whole grains, low-fat dairy products, and lean meat, fish, and poultry.  Eat small meals often instead of 3 large meals a day. Eat your meals slowly, and in a place where you are relaxed. Avoid bending over or lying down until 2-3 hours after eating.  Avoid eating meals 2-3 hours before  bed.  Avoid drinking a lot of liquid with meals.  Cook foods using methods other than frying. Bake, grill, or broil food instead.  Avoid or limit: ? Chocolate. ? Peppermint or spearmint. ? Alcohol. ? Pepper. ? Black and decaffeinated coffee. ? Black and decaffeinated tea. ? Bubbly (carbonated) soft drinks. ? Caffeinated energy drinks and soft drinks.  Limit high-fat foods such as: ? Fatty meat or fried foods. ? Whole milk, cream, butter, or ice cream. ? Nuts and nut butters. ? Pastries, donuts, and sweets made with butter or shortening.  Avoid foods that cause symptoms. These foods may be different for everyone. Common foods that cause  symptoms include: ? Tomatoes. ? Oranges, lemons, and limes. ? Peppers. ? Spicy food. ? Onions and garlic. ? Vinegar. Lifestyle  Maintain a healthy weight. Ask your doctor what weight is healthy for you. If you need to lose weight, work with your doctor to do so safely.  Exercise for at least 30 minutes for 5 or more days each week, or as told by your doctor.  Wear loose-fitting clothes.  Do not smoke. If you need help quitting, ask your doctor.  Sleep with the head of your bed higher than your feet. Use a wedge under the mattress or blocks under the bed frame to raise the head of the bed. Summary  When you have gastroesophageal reflux disease (GERD), food and lifestyle choices are very important in easing your symptoms.  Eat small meals often instead of 3 large meals a day. Eat your meals slowly, and in a place where you are relaxed.  Limit high-fat foods such as fatty meat or fried foods.  Avoid bending over or lying down until 2-3 hours after eating.  Avoid peppermint and spearmint, caffeine, alcohol, and chocolate. This information is not intended to replace advice given to you by your health care provider. Make sure you discuss any questions you have with your health care provider. Document Revised: 05/19/2018 Document Reviewed:  03/03/2016 Elsevier Patient Education  [employer].   If you have lab work done today you will be contacted with your lab results within the next 2 weeks.  If you have not heard from [place] then please contact us. The fastest way to get your results is to register for My Chart.   IF you received an x-ray today, you will receive an invoice from [facility]. Please contact [facility] at [phone] with questions or concerns regarding your invoice.   IF you received labwork today, you will receive an invoice from [facility]. Please contact [facility] at [phone] with questions or concerns regarding your invoice.   Our billing staff will not be able to assist you with questions regarding bills from these companies.  You will be contacted with the lab results as soon as they are available. The fastest way to get your results is to activate your My Chart account. Instructions are located on the last page of this paperwork. If you have not heard from [place] regarding the results in 2 weeks, please contact this office.

## 2019-03-30 ENCOUNTER — Encounter: Payer: Self-pay | Admitting: Family Medicine

## 2019-04-11 ENCOUNTER — Encounter: Payer: Self-pay | Admitting: *Deleted

## 2019-04-11 ENCOUNTER — Other Ambulatory Visit: Payer: Self-pay | Admitting: *Deleted

## 2019-04-11 DIAGNOSIS — Z87891 Personal history of nicotine dependence: Secondary | ICD-10-CM

## 2019-04-17 NOTE — Progress Notes (Signed)
Cardiology Office Note:    Date:  04/18/2019   ID:  Albert Council., DOB 1955/12/12, MRN [id number]  PCP:  [name], MD  Cardiologist:  Norman Herrlich, MD    Referring MD: [name], MD    ASSESSMENT:    1. Aortic valve insufficiency, etiology of cardiac valve disease unspecified   2. Aortic valve stenosis, etiology of cardiac valve disease unspecified   3. Essential hypertension    PLAN:    In order of problems listed above:  1. Medically he has a bicuspid aortic valve his stenosis and regurgitation are not severe we will continue endocarditis prophylaxis he is asymptomatic check an echocardiogram in 1 year follow-up in the office at that time.  He understands the importance of not smoking he continues to vape and is committed to stopping we will continue his current antihypertensives and lipid-lowering therapy with statin.  He will continue to follow endocarditis prophylaxis with amoxicillin through his dentist   Next appointment: 1 year   Medication Adjustments/Labs and Tests Ordered: Current medicines are reviewed at length with the patient today.  Concerns regarding medicines are outlined above.  No orders of the defined types were placed in this encounter.  No orders of the defined types were placed in this encounter.   Chief Complaint  Patient presents with   Follow-up   Aortic Stenosis   Aortic Insuffiency    History of Present Illness:    Albert Romulus. is a 64 y.o. male with a hx of aortic regurgitation last seen 04/04/2018.  His echocardiogram formed 04/05/2018 note his aortic valve to be tricuspid mildly thickened moderate aortic regurgitation and mild stenosis.  The left ventricle was not dilated.  Ejection fraction normal 60 to 65%.  Aortic valve peak and mean gradients 17 and 9 mmHg and pressure half-time of the aortic regurgitant jet was 555 ms.  Blood pressure last visit did not show widened pulse pressure 107/68 03/29/2019.  Other problems  include hyperlipidemia hypertension on high intensity statin.  I personally reviewed the echocardiogram higher to the visit.. Compliance with diet, lifestyle and medications: Yes, he follows amoxicillin endocarditis prophylaxis from his dentist.  I reviewed the echo images prior to the visit and he appears to have a typical bicuspid aortic valve  Reviewed the echocardiogram with the patient.  He understands his illness the natural history plan to follow-up echocardiogram in 1 year and see me in the office.  His physical examination is unremarkable and he is asymptomatic without shortness of breath chest pain or syncope. Past Medical History:  Diagnosis Date   Aortic regurgitation    Atherosclerosis of native arteries of the extremities with intermittent claudication 09/08/2013   GERD (gastroesophageal reflux disease)    Heart murmur    Hyperlipidemia    Hypertension    Peripheral vascular disease with claudication    ABI .49     Substance abuse (HCC)     Past Surgical History:  Procedure Laterality Date   NO PAST SURGERIES      Current Medications: Current Meds  Medication Sig   aspirin 81 MG tablet Take 81 mg by mouth daily.   Cholecalciferol (VITAMIN D3) 1000 UNITS CAPS Take 1 capsule by mouth daily.   Coenzyme Q10 (CO Q 10 PO) Take 1 tablet by mouth daily.   Cyanocobalamin (VITAMIN B-12 PO) 1 tablet daily.   KRILL OIL PO Take 1 tablet by mouth daily.   lisinopril-hydrochlorothiazide (ZESTORETIC) 20-12.5 MG tablet Take 2 tablets by  mouth daily.   MAGNESIUM PO Take 1 tablet by mouth daily.   Multiple Vitamins-Minerals (CENTRUM SILVER PO) Take 1 tablet by mouth daily.   Multiple Vitamins-Minerals (ZINC PO) Take 1 tablet by mouth daily.   omeprazole (PRILOSEC) 20 MG capsule Take 1 capsule (20 mg total) by mouth daily.   rosuvastatin (CRESTOR) 5 MG tablet Take 1 tablet (5 mg total) by mouth at bedtime.   TURMERIC PO Take 1 tablet by mouth daily.       Allergies:   Lipitor [atorvastatin] and Pravastatin   Social History   Socioeconomic History   Marital status: Married    Spouse name: Not on file   Number of children: Not on file   Years of education: Not on file   Highest education level: Not on file  Occupational History   Occupation: Recruitment consultant  Tobacco Use   Smoking status: Former Smoker    Packs/day: 1.00    Years: 30.00    Pack years: 30.00    Types: Cigarettes    Quit date: 12/21/2011    Years since quitting: 7.3   Smokeless tobacco: Never Used  Substance and Sexual Activity   Alcohol use: Yes    Comment: Ocassionaly.    Drug use: Not Currently    Frequency: 1.0 times per week    Types: Marijuana   Sexual activity: Yes    Birth control/protection: None  Other Topics Concern   Not on file  Social History Narrative   Married. Education: [employer]. Exercise:  "Somewhat".   Social Determinants of Health   Financial Resource Strain:    Difficulty of Paying Living Expenses: Not on file  Food Insecurity:    Worried About Programme researcher, broadcasting/film/video in the Last Year: Not on file   [employer] of Food in the Last Year: Not on file  Transportation Needs:    Lack of Transportation (Medical): Not on file   Lack of Transportation (Non-Medical): Not on file  Physical Activity:    Days of Exercise per Week: Not on file   Minutes of Exercise per Session: Not on file  Stress:    Feeling of Stress : Not on file  Social Connections:    Frequency of Communication with Friends and Family: Not on file   Frequency of Social Gatherings with Friends and Family: Not on file   Attends Religious Services: Not on file   Active Member of Clubs or Organizations: Not on file   Attends Banker Meetings: Not on file   Marital Status: Not on file     Family History: The patient's family history includes Arthritis in his mother; Colon cancer in his mother; Diabetes in his sister; Heart attack in  his father; Hyperlipidemia in his brother, brother, mother, and sister; Hypertension in his brother, mother, and sister; Other in his brother. ROS:   Please see the history of present illness.    All other systems reviewed and are negative.  EKGs/Labs/Other Studies Reviewed:    The following studies were reviewed today:    Recent Labs: 03/03/2019: ALT 16; BUN 16; Creatinine, Ser 1.10; NT-Pro BNP 56; Potassium 4.3; Sodium 142  Recent Lipid Panel    Component Value Date/Time   CHOL 200 (H) 03/03/2019 1427   TRIG 72 03/03/2019 1427   HDL 75 03/03/2019 1427   CHOLHDL 2.7 03/03/2019 1427   CHOLHDL 2.7 08/27/2014 1511   VLDL 11 08/27/2014 1511   LDLCALC 112 (H) 03/03/2019 1427    Physical Exam:  VS:  BP 120/66    Pulse 70    Temp 98.4 F (36.9 C)    Ht 5\' 9"  (1.753 m)    Wt 161 lb (73 kg)    SpO2 98%    BMI 23.78 kg/m     Wt Readings from Last 3 Encounters:  04/18/19 161 lb (73 kg)  03/29/19 161 lb (73 kg)  03/27/19 165 lb (74.8 kg)     GEN:  Well nourished, well developed in no acute distress HEENT: Normal NECK: No JVD; No carotid bruits LYMPHATICS: No lymphadenopathy CARDIAC: Grade 1/6 to 2/6 midsystolic ejection murmur aortic area S2 is normal does not radiate to the carotids and cannot auscultate aortic regurgitation RRR, no murmurs, rubs, gallops RESPIRATORY:  Clear to auscultation without rales, wheezing or rhonchi  ABDOMEN: Soft, non-tender, non-distended MUSCULOSKELETAL:  No edema; No deformity  SKIN: Warm and dry NEUROLOGIC:  Alert and oriented x 3 PSYCHIATRIC:  Normal affect    Signed, Shirlee More, MD  04/18/2019 3:14 PM    [facility]

## 2019-04-18 ENCOUNTER — Encounter: Payer: Self-pay | Admitting: Cardiology

## 2019-04-18 ENCOUNTER — Ambulatory Visit (INDEPENDENT_AMBULATORY_CARE_PROVIDER_SITE_OTHER): Payer: BC Managed Care – PPO | Admitting: Cardiology

## 2019-04-18 ENCOUNTER — Other Ambulatory Visit: Payer: Self-pay

## 2019-04-18 VITALS — BP 120/66 | HR 70 | Temp 98.4°F | Ht 69.0 in | Wt 161.0 lb

## 2019-04-18 DIAGNOSIS — I351 Nonrheumatic aortic (valve) insufficiency: Secondary | ICD-10-CM | POA: Diagnosis not present

## 2019-04-18 DIAGNOSIS — I350 Nonrheumatic aortic (valve) stenosis: Secondary | ICD-10-CM

## 2019-04-18 DIAGNOSIS — I10 Essential (primary) hypertension: Secondary | ICD-10-CM

## 2019-04-18 NOTE — Patient Instructions (Signed)
Medication Instructions:  Your physician recommends that you continue on your current medications as directed. Please refer to the Current Medication list given to you today.  *If you need a refill on your cardiac medications before your next appointment, please call your pharmacy*   Lab Work: NONE If you have labs (blood work) drawn today and your tests are completely normal, you will receive your results only by: Marland Kitchen MyChart Message (if you have MyChart) OR . A paper copy in the mail If you have any lab test that is abnormal or we need to change your treatment, we will call you to review the results.   Testing/Procedures: Your physician has requested that you have an echocardiogram. Echocardiography is a painless test that uses sound waves to create images of your heart. It provides your doctor with information about the size and shape of your heart and how well your heart's chambers and valves are working. This procedure takes approximately one hour. There are no restrictions for this procedure.     Follow-Up: At [facility], you and your health needs are our priority.  As part of our continuing mission to provide you with exceptional heart care, we have created designated Provider Care Teams.  These Care Teams include your primary Cardiologist (physician) and Advanced Practice Providers (APPs -  Physician Assistants and Nurse Practitioners) who all work together to provide you with the care you need, when you need it.  We recommend signing up for the patient portal called "MyChart".  Sign up information is provided on this After Visit Summary.  MyChart is used to connect with patients for Virtual Visits (Telemedicine).  Patients are able to view lab/test results, encounter notes, upcoming appointments, etc.  Non-urgent messages can be sent to your provider as well.   To learn more about what you can do with MyChart, go to [email or web address].    Your next appointment:   12  month(s)  The format for your next appointment:   In Person  Provider:   Norman Herrlich, MD   Other Instructions

## 2019-04-26 ENCOUNTER — Ambulatory Visit: Payer: BC Managed Care – PPO | Admitting: Family Medicine

## 2019-04-26 ENCOUNTER — Other Ambulatory Visit: Payer: Self-pay

## 2019-04-26 ENCOUNTER — Encounter: Payer: Self-pay | Admitting: Family Medicine

## 2019-04-26 VITALS — BP 131/74 | HR 73 | Temp 97.8°F | Ht 69.0 in | Wt 166.2 lb

## 2019-04-26 DIAGNOSIS — J3489 Other specified disorders of nose and nasal sinuses: Secondary | ICD-10-CM

## 2019-04-26 DIAGNOSIS — R05 Cough: Secondary | ICD-10-CM

## 2019-04-26 DIAGNOSIS — I10 Essential (primary) hypertension: Secondary | ICD-10-CM

## 2019-04-26 DIAGNOSIS — R059 Cough, unspecified: Secondary | ICD-10-CM

## 2019-04-26 DIAGNOSIS — K219 Gastro-esophageal reflux disease without esophagitis: Secondary | ICD-10-CM

## 2019-04-26 MED ORDER — FLUTICASONE PROPIONATE 50 MCG/ACT NA SUSP: 1.0000 | Freq: Every day | NASAL | 6 refills | Status: DC

## 2019-04-26 MED ORDER — OMEPRAZOLE 20 MG PO CPDR: 20.0000 mg | DELAYED_RELEASE_CAPSULE | Freq: Every day | ORAL | 1 refills | Status: DC

## 2019-04-26 NOTE — Patient Instructions (Addendum)
  Continue omeprazole in the morning, pepcid at bedtime. Start flonase nasal spray for allergies. If cough not improving in next 3 weeks - return for recheck.   No change in blood pressure meds for now.   If you have lab work done today you will be contacted with your lab results within the next 2 weeks.  If you have not heard from [place] then please contact us. The fastest way to get your results is to register for My Chart.   IF you received an x-ray today, you will receive an invoice from [facility]. Please contact [facility] at [phone] with questions or concerns regarding your invoice.   IF you received labwork today, you will receive an invoice from [facility]. Please contact [facility] at [phone] with questions or concerns regarding your invoice.   Our billing staff will not be able to assist you with questions regarding bills from these companies.  You will be contacted with the lab results as soon as they are available. The fastest way to get your results is to activate your My Chart account. Instructions are located on the last page of this paperwork. If you have not heard from [place] regarding the results in 2 weeks, please contact this office.

## 2019-04-26 NOTE — Progress Notes (Signed)
Subjective:  Patient ID: Albert Hardy., male    DOB: [birth year]  Age: 64 y.o. MRN: [id number]  CC:  Chief Complaint  Patient presents with  . Cough    Follow up on cough. pt states he still has the cough, and it's productive at times and some times it's dry. pt is not congested and not sinus pressure.   . Hypertension    pt states he hasn't had any issues with his BP since last visit. pt has stoped taking amlodipine as directed, and pt clames his BP has been staying normal since.    HPI Albert Hardy. presents for   Hypertension: With history of aortic valve insufficiency.  Cardiology eval 3/9.  Bicuspid aortic valve, stenosis with regurgitation not reported severe, plan for continued endocarditis prophylaxis.  Recheck echo in 1 year.  Recommended cutting back on vaping.  No change in meds.  Amoxicillin prophylaxis for dental procedures. He was having dizziness with quick standing last visit, held amlodipine, continue lisinopril 40 mg, HCTZ 25 mg.  Home readings off norvasc - 120-130/60-80.  orthastatic symptoms resolved.  BP Readings from Last 3 Encounters:  04/26/19 131/74  04/18/19 120/66  03/29/19 118/70   Lab Results  Component Value Date   CREATININE 1.10 03/03/2019    Cough: Discussed possible upper airway irritation, start omeprazole 20 mg daily.  Pepcid before bedtime as needed.  Denies postnasal drip or nasal congestion. About the same. Taking omeprazole - helps reflux. Has tried to adjust meal time and not lying down afterwards.  Dry cough. Occasional runny nose. No PND. Rare sneeze.  No CP/dyspnea.  History Patient Active Problem List   Diagnosis Date Noted  . Bilateral sensorineural hearing loss 12/14/2018  . Bilateral impacted cerumen 10/13/2018  . 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 Aortic stenosis 04/04/2018  . Atherosclerosis of native arteries of the extremities with intermittent claudication 09/08/2013  . Former moderate cigarette smoker  (10-19 per day) 05/20/2012  . Peripheral vascular disease with claudication   . Hyperlipidemia   . Aortic regurgitation   . GERD (gastroesophageal reflux disease)   . Hypertension    Past Medical History:  Diagnosis Date  . Aortic regurgitation   . Atherosclerosis of native arteries of the extremities with intermittent claudication 09/08/2013  . GERD (gastroesophageal reflux disease)   . Heart murmur   . Hyperlipidemia   . Hypertension   . Peripheral vascular disease with claudication    ABI .49    . Substance abuse [facility])    Past Surgical History:  Procedure Laterality Date  . NO PAST SURGERIES     Allergies  Allergen Reactions  . Lipitor [Atorvastatin]     myalgia  . Pravastatin Other (See Comments)    myalgia   Prior to Admission medications   Medication Sig Start Date End Date Taking? Authorizing Provider  aspirin 81 MG tablet Take 81 mg by mouth daily.    [provider]  Cholecalciferol (VITAMIN D3) 1000 UNITS CAPS Take 1 capsule by mouth daily.    [provider]  Coenzyme Q10 (CO Q 10 PO) Take 1 tablet by mouth daily.    [provider]  Cyanocobalamin (VITAMIN B-12 PO) 1 tablet daily.    [provider]  KRILL OIL PO Take 1 tablet by mouth daily.    [provider]  lisinopril-hydrochlorothiazide (ZESTORETIC) 20-12.5 MG tablet Take 2 tablets by mouth daily. 03/29/19   [name], MD  MAGNESIUM PO Take 1  tablet by mouth daily.    [provider]  Multiple Vitamins-Minerals (CENTRUM SILVER PO) Take 1 tablet by mouth daily.    [provider]  Multiple Vitamins-Minerals (ZINC PO) Take 1 tablet by mouth daily.    [provider]  omeprazole (PRILOSEC) 20 MG capsule Take 1 capsule (20 mg total) by mouth daily. 03/29/19   [name], MD  rosuvastatin (CRESTOR) 5 MG tablet Take 1 tablet (5 mg total) by mouth at bedtime. 03/29/19   [name], MD  TURMERIC PO Take 1 tablet by mouth  daily.     [provider]   Social History   Socioeconomic History  . Marital status: Married    Spouse name: Not on file  . Number of children: Not on file  . Years of education: Not on file  . Highest education level: Not on file  Occupational History  . Occupation: Recruitment consultant  Tobacco Use  . Smoking status: Former Smoker    Packs/day: 1.00    Years: 30.00    Pack years: 30.00    Types: Cigarettes    Quit date: 12/21/2011    Years since quitting: 7.3  . Smokeless tobacco: Never Used  Substance and Sexual Activity  . Alcohol use: Yes    Comment: Ocassionaly.   . Drug use: Not Currently    Frequency: 1.0 times per week    Types: Marijuana  . Sexual activity: Yes    Birth control/protection: None  Other Topics Concern  . Not on file  Social History Narrative   Married. Education: [employer]. Exercise:  "Somewhat".   Social Determinants of Health   Financial Resource Strain:   . Difficulty of Paying Living Expenses:   Food Insecurity:   . Worried About Programme researcher, broadcasting/film/video in the Last Year:   . Barista in the Last Year:   Transportation Needs:   . Freight forwarder (Medical):   Marland Kitchen Lack of Transportation (Non-Medical):   Physical Activity:   . Days of Exercise per Week:   . Minutes of Exercise per Session:   Stress:   . Feeling of Stress :   Social Connections:   . Frequency of Communication with Friends and Family:   . Frequency of Social Gatherings with Friends and Family:   . Attends Religious Services:   . Active Member of Clubs or Organizations:   . Attends Banker Meetings:   Marland Kitchen Marital Status:   Intimate Partner Violence:   . Fear of Current or Ex-Partner:   . Emotionally Abused:   Marland Kitchen Physically Abused:   . Sexually Abused:     Review of Systems  Per HPI.  Objective:   Vitals:   04/26/19 1431  BP: 131/74  Pulse: 73  Temp: 97.8 F (36.6 C)  TempSrc: Temporal  SpO2: 99%  Weight: 166 lb 3.2 oz (75.4  kg)  Height: 5\' 9"  (1.753 m)     Physical Exam Vitals reviewed.  Constitutional:      Appearance: He is well-developed.  HENT:     Head: Normocephalic and atraumatic.     Nose:     Comments: Min edema of turbinates bilaterally.  Eyes:     Pupils: Pupils are equal, round, and reactive to light.  Neck:     Vascular: No carotid bruit or JVD.  Cardiovascular:     Rate and Rhythm: Normal rate and regular rhythm.     Heart sounds: Normal heart sounds. No murmur.  Pulmonary:     Effort: Pulmonary effort is normal.     Breath sounds: Normal breath sounds. No rales.  Skin:    General: Skin is warm and dry.  Neurological:     Mental Status: He is alert and oriented to person, place, and time.        Assessment & Plan:  Jeyren Danowski. is a 64 y.o. male . Cough - Plan: fluticasone (FLONASE) 50 MCG/ACT nasal spray Rhinorrhea - Plan: fluticasone (FLONASE) 50 MCG/ACT nasal spray Gastroesophageal reflux disease, unspecified whether esophagitis present - Plan: omeprazole (PRILOSEC) 20 MG capsule  -Improved reflux symptoms.  Refill omeprazole., can try Pepcid if still persistent dry cough.  Start Flonase for possible allergic rhinitis. RTC precautions if cough not resolved in the next 3 weeks, sooner if worse.  Essential hypertension  -Stable off amlodipine with resolution of orthostatic symptoms.  Continue same, recheck 6 months.   Meds ordered this encounter  Medications  . omeprazole (PRILOSEC) 20 MG capsule    Sig: Take 1 capsule (20 mg total) by mouth daily.    Dispense:  90 capsule    Refill:  1  . fluticasone (FLONASE) 50 MCG/ACT nasal spray    Sig: Place 1 spray into both nostrils daily.    Dispense:  16 g    Refill:  6   Patient Instructions    Continue omeprazole in the morning, pepcid at bedtime. Start flonase nasal spray for allergies. If cough not improving in next 3 weeks - return for recheck.   No change in blood pressure meds for now.   If you have lab work  done today you will be contacted with your lab results within the next 2 weeks.  If you have not heard from [place] then please contact us. The fastest way to get your results is to register for My Chart.   IF you received an x-ray today, you will receive an invoice from [facility]. Please contact [facility]iology at [phone] with questions or concerns regarding your invoice.   IF you received labwork today, you will receive an invoice from [place]. Please contact [facility] at [phone] with questions or concerns regarding your invoice.   Our billing staff will not be able to assist you with questions regarding bills from these companies.  You will be contacted with the lab results as soon as they are available. The fastest way to get your results is to activate your My Chart account. Instructions are located on the last page of this paperwork. If you have not heard from [place] regarding the results in 2 weeks, please contact this office.         Signed, Merri Ray, MD [facility]

## 2019-10-25 ENCOUNTER — Ambulatory Visit: Payer: BC Managed Care – PPO | Admitting: Family Medicine

## 2019-10-25 ENCOUNTER — Ambulatory Visit (INDEPENDENT_AMBULATORY_CARE_PROVIDER_SITE_OTHER): Payer: Commercial Managed Care - PPO

## 2019-10-25 ENCOUNTER — Other Ambulatory Visit: Payer: Self-pay

## 2019-10-25 ENCOUNTER — Encounter: Payer: Self-pay | Admitting: Family Medicine

## 2019-10-25 VITALS — BP 138/66 | HR 75 | Temp 97.8°F | Resp 15 | Ht 69.0 in | Wt 157.8 lb

## 2019-10-25 DIAGNOSIS — E785 Hyperlipidemia, unspecified: Secondary | ICD-10-CM | POA: Diagnosis not present

## 2019-10-25 DIAGNOSIS — Z23 Encounter for immunization: Secondary | ICD-10-CM | POA: Diagnosis not present

## 2019-10-25 DIAGNOSIS — K219 Gastro-esophageal reflux disease without esophagitis: Secondary | ICD-10-CM

## 2019-10-25 DIAGNOSIS — R059 Cough, unspecified: Secondary | ICD-10-CM

## 2019-10-25 DIAGNOSIS — I10 Essential (primary) hypertension: Secondary | ICD-10-CM

## 2019-10-25 DIAGNOSIS — J3489 Other specified disorders of nose and nasal sinuses: Secondary | ICD-10-CM

## 2019-10-25 DIAGNOSIS — R05 Cough: Secondary | ICD-10-CM | POA: Diagnosis not present

## 2019-10-25 DIAGNOSIS — R739 Hyperglycemia, unspecified: Secondary | ICD-10-CM

## 2019-10-25 MED ORDER — ROSUVASTATIN CALCIUM 5 MG PO TABS: 5.0000 mg | ORAL_TABLET | Freq: Every day | ORAL | 1 refills | Status: DC

## 2019-10-25 MED ORDER — LISINOPRIL-HYDROCHLOROTHIAZIDE 20-12.5 MG PO TABS: 2.0000 | ORAL_TABLET | Freq: Every day | ORAL | 3 refills | Status: DC

## 2019-10-25 MED ORDER — OMEPRAZOLE 20 MG PO CPDR: 20.0000 mg | DELAYED_RELEASE_CAPSULE | Freq: Every day | ORAL | 1 refills | Status: DC

## 2019-10-25 MED ORDER — FLUTICASONE PROPIONATE 50 MCG/ACT NA SUSP: 1.0000 | Freq: Every day | NASAL | 6 refills | Status: DC

## 2019-10-25 NOTE — Patient Instructions (Addendum)
Continue omeprazole and then Pepcid at night to see if that will help treat the cough again.  Okay to use Flonase for the drainage especially if that helps the cough.  If cough does not resolve, we certainly could try coming off lisinopril which has been sometimes associated with a dry cough.  In addition I will also refer you to pulmonology to see if they want to do some testing for possible COPD contributing to your cough.    If you have lab work done today you will be contacted with your lab results within the next 2 weeks.  If you have not heard from [place] then please contact us. The fastest way to get your results is to register for My Chart.   IF you received an x-ray today, you will receive an invoice from [facility]. Please contact [facility] Radiology at [phone] with questions or concerns regarding your invoice.   IF you received labwork today, you will receive an invoice from [place]. Please contact [facility] at [phone] with questions or concerns regarding your invoice.   Our billing staff will not be able to assist you with questions regarding bills from these companies.  You will be contacted with the lab results as soon as they are available. The fastest way to get your results is to activate your My Chart account. Instructions are located on the last page of this paperwork. If you have not heard from [place] regarding the results in 2 weeks, please contact this office.

## 2019-10-25 NOTE — Progress Notes (Signed)
Subjective:  Patient ID: Albert Hardy Hoctor Jr., male    DOB: [birth year]  Age: 64 y.o. MRN: [id number]  CC:  Chief Complaint  Patient presents with  . Cough    pt notes he has been struggling with a cough for several months notes it comes and goes and he states it can be dry but more productive, otherwise patient is well   . Hypertension    pt doing well denies physical symtoms, requesting refill lisinopril  . Gastroesophageal Reflux    pt notes no side effects doing well requesting refill omeprazole     HPI Albert Hardy [name] presents for   Cough: Discussed in March.  Suspected component of upper airway cough syndrome.  Was taking PPI, with some relief in heartburn symptoms using omeprazole.  Option of additional Pepcid before bedtime, and recommended Flonase for possible postnasal drip with occasional runny nose. Still persistent cough, comes and goes, sometimes dry, sometimes productive. Chest CT for lung cancer screening in February 2021.  Did have some mild diffuse bronchial wall thickening with very mild centrilobular and paraseptal emphysema, findings suggestive of possible underlying COPD. Cough improved with flonase and when clogged up.  pepcid helped at night - not constant. Less cough with pepcid.  Not sure if wheeze - poosible short of breath at times, not with bowling.  Cough 3 days per week. lying down at times.  Quit smoking in 2013. About 40 pack yr hx  Hypertension: With history of aortic valve insufficiency, cardiology appointment March 9 with Dr. Dulce SellarMunley.  Previous orthostatic symptoms, blood pressure was stable off amlodipine with resolution of those symptoms.  He was continued on lisinopril 40 mg daily, HCTZ 25 mg daily. Home readings:130/60 about a month ago.  BP Readings from Last 3 Encounters:  10/25/19 138/66  04/26/19 131/74  04/18/19 120/66   Lab Results  Component Value Date   CREATININE 1.10 03/03/2019   GERD: Doing well with omeprazole, and pepcid.    Hyperlipidemia: crestor 20mg  qd.  No myalgias. Fasting today.  Lab Results  Component Value Date   CHOL 200 (H) 03/03/2019   HDL 75 03/03/2019   LDLCALC 112 (H) 03/03/2019   TRIG 72 03/03/2019   CHOLHDL 2.7 03/03/2019   Lab Results  Component Value Date   ALT 16 03/03/2019   AST 21 03/03/2019   ALKPHOS 62 03/03/2019   BILITOT 0.2 03/03/2019    Glucose 100 in January, 104 in 2020.    History Patient Active Problem List   Diagnosis Date Noted  . Bilateral sensorineural hearing loss 12/14/2018  . Bilateral impacted cerumen 10/13/2018  . 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 Aortic stenosis 04/04/2018  . Atherosclerosis of native arteries of the extremities with intermittent claudication 09/08/2013  . Former moderate cigarette smoker (10-19 per day) 05/20/2012  . Peripheral vascular disease with claudication   . Hyperlipidemia   . Aortic regurgitation   . GERD (gastroesophageal reflux disease)   . Hypertension    Past Medical History:  Diagnosis Date  . Aortic regurgitation   . Atherosclerosis of native arteries of the extremities with intermittent claudication 09/08/2013  . GERD (gastroesophageal reflux disease)   . Heart murmur   . Hyperlipidemia   . Hypertension   . Peripheral vascular disease with claudication    ABI .49    . Substance abuse [facility](HCC)    Past Surgical History:  Procedure Laterality Date  . NO PAST SURGERIES     Allergies  Allergen Reactions  .  Lipitor [Atorvastatin]     myalgia  . Pravastatin Other (See Comments)    myalgia   Prior to Admission medications   Medication Sig Start Date End Date Taking? Authorizing Provider  aspirin 81 MG tablet Take 81 mg by mouth daily.   Yes [provider]  Cholecalciferol (VITAMIN D3) 1000 UNITS CAPS Take 1 capsule by mouth daily.   Yes [provider]  Coenzyme Q10 (CO Q 10 PO) Take 1 tablet by mouth daily.   Yes [provider]  Cyanocobalamin (VITAMIN B-12 PO) 1  tablet daily.   Yes [provider]  fluticasone (FLONASE) 50 MCG/ACT nasal spray Place 1 spray into both nostrils daily. 04/26/19  Yes [name], MD  KRILL OIL PO Take 1 tablet by mouth daily.   Yes [provider]  lisinopril-hydrochlorothiazide (ZESTORETIC) 20-12.5 MG tablet Take 2 tablets by mouth daily. 03/29/19  Yes [name], MD  MAGNESIUM PO Take 1 tablet by mouth daily.   Yes [provider]  Multiple Vitamins-Minerals (CENTRUM SILVER PO) Take 1 tablet by mouth daily.   Yes [provider]  Multiple Vitamins-Minerals (ZINC PO) Take 1 tablet by mouth daily.   Yes [provider]  omeprazole (PRILOSEC) 20 MG capsule Take 1 capsule (20 mg total) by mouth daily. 04/26/19  Yes [name], MD  rosuvastatin (CRESTOR) 5 MG tablet Take 1 tablet (5 mg total) by mouth at bedtime. 03/29/19  Yes [name], MD  TURMERIC PO Take 1 tablet by mouth daily.    Yes [provider]   Social History   Socioeconomic History  . Marital status: Married    Spouse name: Not on file  . Number of children: Not on file  . Years of education: Not on file  . Highest education level: Not on file  Occupational History  . Occupation: Recruitment consultant  Tobacco Use  . Smoking status: Former Smoker    Packs/day: 1.00    Years: 30.00    Pack years: 30.00    Types: Cigarettes    Quit date: 12/21/2011    Years since quitting: 7.8  . Smokeless tobacco: Never Used  Vaping Use  . Vaping Use: Every day  Substance and Sexual Activity  . Alcohol use: Yes    Comment: Ocassionaly.   . Drug use: Not Currently    Frequency: 1.0 times per week    Types: Marijuana  . Sexual activity: Yes    Birth control/protection: None  Other Topics Concern  . Not on file  Social History Narrative   Married. Education: [employer]. Exercise:  "Somewhat".   Social Determinants of Health   Financial Resource Strain:   . Difficulty of Paying Living  Expenses: Not on file  Food Insecurity:   . Worried About Programme researcher, broadcasting/film/video in the Last Year: Not on file  . Ran Out of Food in the Last Year: Not on file  Transportation Needs:   . Lack of Transportation (Medical): Not on file  . Lack of Transportation (Non-Medical): Not on file  Physical Activity:   . Days of Exercise per Week: Not on file  . Minutes of Exercise per Session: Not on file  Stress:   . Feeling of Stress : Not on file  Social Connections:   . Frequency of Communication with Friends and Family: Not on file  . Frequency of Social Gatherings with Friends and Family: Not on file  . Attends Religious Services: Not on file  .  Active Member of Clubs or Organizations: Not on file  . Attends Banker Meetings: Not on file  . Marital Status: Not on file  Intimate Partner Violence:   . Fear of Current or Ex-Partner: Not on file  . Emotionally Abused: Not on file  . Physically Abused: Not on file  . Sexually Abused: Not on file    Review of Systems  Constitutional: Negative for fatigue and unexpected weight change.  Eyes: Negative for visual disturbance.  Respiratory: Negative for cough, chest tightness and shortness of breath.   Cardiovascular: Negative for chest pain (belching relieves soreness at times. ), palpitations and leg swelling.  Gastrointestinal: Negative for abdominal pain and blood in stool.  Neurological: Negative for dizziness, light-headedness and headaches.     Objective:   Vitals:   10/25/19 1406 10/25/19 1447  BP: (!) 155/55 138/66  Pulse: 75   Resp: 15   Temp: 97.8 F (36.6 C)   TempSrc: Temporal   SpO2: 99%   Weight: 157 lb 12.8 oz (71.6 kg)   Height: 5\' 9"  (1.753 m)      Physical Exam Vitals reviewed.  Constitutional:      Appearance: He is well-developed.  HENT:     Head: Normocephalic and atraumatic.  Eyes:     Pupils: Pupils are equal, round, and reactive to light.  Neck:     Vascular: No carotid bruit or JVD.    Cardiovascular:     Rate and Rhythm: Normal rate and regular rhythm.     Heart sounds: Normal heart sounds. No murmur heard.   Pulmonary:     Effort: Pulmonary effort is normal.     Breath sounds: Normal breath sounds. No rales.  Skin:    General: Skin is warm and dry.  Neurological:     Mental Status: He is alert and oriented to person, place, and time.  Psychiatric:        Mood and Affect: Mood normal.      39 minutes spent during visit, greater than 50% counseling and assimilation of information, chart review, and discussion of plan.    Assessment & Plan:  Charvez Voorhies. is a 64 y.o. male . Hyperglycemia - Plan: Comprehensive metabolic panel, Hemoglobin A1c  -Check A1c, watch diet.  Gastroesophageal reflux disease, unspecified whether esophagitis present - Plan: omeprazole (PRILOSEC) 20 MG capsule  -Stable with Prilosec, Pepcid.  Continue same  Hyperlipidemia, unspecified hyperlipidemia type - Plan: lisinopril-hydrochlorothiazide (ZESTORETIC) 20-12.5 MG tablet, Lipid panel, rosuvastatin (CRESTOR) 5 MG tablet  -Continue Crestor, labs pending  Cough - Plan: Ambulatory referral to Pulmonology, DG Chest 2 View, fluticasone (FLONASE) 50 MCG/ACT nasal spray  -Repeat chest x-ray noted, with tobacco history suspicious for component of COPD but cough improved with treatment of upper airway cough syndrome causes.  Will refer to pulmonary to decide on spirometry, other testing.  Continue PPI, H2 blocker, steroid nasal spray.  -He is on ACE inhibitor but less likely cause of cough as improvement with treatment as above.  If persistent cough, could try off ACE inhibitor with ARB in place.  Rhinorrhea - Plan: fluticasone (FLONASE) 50 MCG/ACT nasal spray  -Improved with fluticasone.  Refilled  Essential hypertension - Plan: Comprehensive metabolic panel  -Stable, check labs, continue same regimen.  Meds ordered this encounter  Medications  . omeprazole (PRILOSEC) 20 MG capsule     Sig: Take 1 capsule (20 mg total) by mouth daily.    Dispense:  90 capsule    Refill:  1  . lisinopril-hydrochlorothiazide (ZESTORETIC) 20-12.5 MG tablet    Sig: Take 2 tablets by mouth daily.    Dispense:  180 tablet    Refill:  3  . fluticasone (FLONASE) 50 MCG/ACT nasal spray    Sig: Place 1-2 sprays into both nostrils daily.    Dispense:  16 g    Refill:  6  . rosuvastatin (CRESTOR) 5 MG tablet    Sig: Take 1 tablet (5 mg total) by mouth at bedtime.    Dispense:  90 tablet    Refill:  1   Patient Instructions   Continue omeprazole and then Pepcid at night to see if that will help treat the cough again.  Okay to use Flonase for the drainage especially if that helps the cough.  If cough does not resolve, we certainly could try coming off lisinopril which has been sometimes associated with a dry cough.  In addition I will also refer you to pulmonology to see if they want to do some testing for possible COPD contributing to your cough.    If you have lab work done today you will be contacted with your lab results within the next 2 weeks.  If you have not heard from [place] then please contact us. The fastest way to get your results is to register for My Chart.   IF you received an x-ray today, you will receive an invoice from [facility]. Please contact [facility] Radiology at [phone] with questions or concerns regarding your invoice.   IF you received labwork today, you will receive an invoice from [place]. Please contact [facility] at [phone] with questions or concerns regarding your invoice.   Our billing staff will not be able to assist you with questions regarding bills from these companies.  You will be contacted with the lab results as soon as they are available. The fastest way to get your results is to activate your My Chart account. Instructions are located on the last page of this paperwork. If you have not heard from [place] regarding the results in 2 weeks, please  contact this office.         Signed, Meredith Staggers, MD [facility]

## 2019-10-26 LAB — HEMOGLOBIN A1C
Est. average glucose Bld gHb Est-mCnc: 120 mg/dL
Hgb A1c MFr Bld: 5.8 % — ABNORMAL HIGH (ref 4.8–5.6)

## 2019-10-26 LAB — COMPREHENSIVE METABOLIC PANEL
ALT: 13 IU/L (ref 0–44)
AST: 15 IU/L (ref 0–40)
Albumin/Globulin Ratio: 1.7 (ref 1.2–2.2)
Albumin: 4.6 g/dL (ref 3.8–4.8)
Alkaline Phosphatase: 64 IU/L (ref 44–121)
BUN/Creatinine Ratio: 17 (ref 10–24)
BUN: 20 mg/dL (ref 8–27)
Bilirubin Total: <0.2 mg/dL (ref 0.0–1.2)
CO2: 27 mmol/L (ref 20–29)
Calcium: 9.7 mg/dL (ref 8.6–10.2)
Chloride: 105 mmol/L (ref 96–106)
Creatinine, Ser: 1.18 mg/dL (ref 0.76–1.27)
GFR calc Af Amer: 75 mL/min/{1.73_m2} (ref >59)
GFR calc non Af Amer: 65 mL/min/{1.73_m2} (ref >59)
Globulin, Total: 2.7 g/dL (ref 1.5–4.5)
Glucose: 96 mg/dL (ref 65–99)
Potassium: 4.5 mmol/L (ref 3.5–5.2)
Sodium: 144 mmol/L (ref 134–144)
Total Protein: 7.3 g/dL (ref 6.0–8.5)

## 2019-10-26 LAB — LIPID PANEL
Chol/HDL Ratio: 3 ratio (ref 0.0–5.0)
Cholesterol, Total: 220 mg/dL — ABNORMAL HIGH (ref 100–199)
HDL: 73 mg/dL (ref >39)
LDL Chol Calc (NIH): 137 mg/dL — ABNORMAL HIGH (ref 0–99)
Triglycerides: 60 mg/dL (ref 0–149)
VLDL Cholesterol Cal: 10 mg/dL (ref 5–40)

## 2019-12-15 ENCOUNTER — Other Ambulatory Visit: Payer: Self-pay

## 2019-12-15 ENCOUNTER — Encounter: Payer: Self-pay | Admitting: Pulmonary Disease

## 2019-12-15 ENCOUNTER — Ambulatory Visit: Payer: Commercial Managed Care - PPO | Admitting: Pulmonary Disease

## 2019-12-15 VITALS — BP 128/66 | HR 71 | Temp 97.6°F | Ht 69.0 in | Wt 155.2 lb

## 2019-12-15 DIAGNOSIS — K219 Gastro-esophageal reflux disease without esophagitis: Secondary | ICD-10-CM

## 2019-12-15 DIAGNOSIS — F17210 Nicotine dependence, cigarettes, uncomplicated: Secondary | ICD-10-CM

## 2019-12-15 DIAGNOSIS — R058 Other specified cough: Secondary | ICD-10-CM | POA: Diagnosis not present

## 2019-12-15 DIAGNOSIS — R059 Cough, unspecified: Secondary | ICD-10-CM | POA: Diagnosis not present

## 2019-12-15 DIAGNOSIS — Z87891 Personal history of nicotine dependence: Secondary | ICD-10-CM

## 2019-12-15 DIAGNOSIS — F17200 Nicotine dependence, unspecified, uncomplicated: Secondary | ICD-10-CM

## 2019-12-15 DIAGNOSIS — J432 Centrilobular emphysema: Secondary | ICD-10-CM

## 2019-12-15 DIAGNOSIS — Z716 Tobacco abuse counseling: Secondary | ICD-10-CM

## 2019-12-15 MED ORDER — VARENICLINE TARTRATE 0.5 MG X 11 & 1 MG X 42 PO MISC: ORAL | 0 refills | Status: DC

## 2019-12-15 MED ORDER — ANORO ELLIPTA 62.5-25 MCG/INH IN AEPB: 1.0000 | INHALATION_SPRAY | Freq: Every day | RESPIRATORY_TRACT | 0 refills | Status: DC

## 2019-12-15 MED ORDER — UMECLIDINIUM-VILANTEROL 62.5-25 MCG/INH IN AEPB: 1.0000 | INHALATION_SPRAY | Freq: Every day | RESPIRATORY_TRACT | 2 refills | Status: DC

## 2019-12-15 MED ORDER — ALBUTEROL SULFATE HFA 108 (90 BASE) MCG/ACT IN AERS: 2.0000 | INHALATION_SPRAY | Freq: Four times a day (QID) | RESPIRATORY_TRACT | 6 refills | Status: DC | PRN

## 2019-12-15 NOTE — Patient Instructions (Addendum)
Thank you for visiting Dr. Tonia Brooms at [facility]. Today we recommend the following:  Orders Placed This Encounter  Procedures  . Pulmonary Function Test   Meds ordered this encounter  Medications  . umeclidinium-vilanterol (ANORO ELLIPTA) 62.5-25 MCG/INH AEPB    Sig: Inhale 1 puff into the lungs daily.    Dispense:  3 each    Refill:  2  . albuterol (VENTOLIN HFA) 108 (90 Base) MCG/ACT inhaler    Sig: Inhale 2 puffs into the lungs every 6 (six) hours as needed for wheezing or shortness of breath.    Dispense:  8 g    Refill:  6  . varenicline (CHANTIX PAK) 0.5 MG X 11 & 1 MG X 42 tablet    Sig: Take one 0.5 mg tablet by mouth once daily for 3 days, then increase to one 0.5 mg tablet twice daily for 4 days, then increase to one 1 mg tablet twice daily.    Dispense:  53 tablet    Refill:  0   Return in about 8 weeks (around 02/09/2020) for Dr. Tonia Brooms . PFTs prior to next visit.     Please do your part to reduce the spread of COVID-19.   You must quit smoking or vaping. This is the single most important thing that you can do to improve your lung health.   S = Set a quit date. T = Tell family, friends, and the people around you that you plan to quit. A = Anticipate or plan ahead for the tough times you'll face while quitting. R = Remove cigarettes and other tobacco products from your home, car, and work T = Talk to [place] about getting help to quit  If you need help feel free to reach out to our office, [facility] Smoking Cessation Class: [phone], call 1-800-QUIT-NOW, or visit [email or web address].

## 2019-12-15 NOTE — Addendum Note (Signed)
Addended by: Marcellus Scott on: 12/15/2019 11:50 AM   Modules accepted: Orders

## 2019-12-15 NOTE — Progress Notes (Signed)
Synopsis: Referred in nov 2021 for  cough by [name], MD  Subjective:   PATIENT ID: Albert Hardy. GENDER: male DOB: 1955-09-12, MRN: [id number]  Chief Complaint  Patient presents with  . Consult    cough--sometimes worse at night with GERD.  some SHOB at times. the cough comes and goes    PMH HTN, HLD, PVD, GERD, here for chronic cough evaluation. Former smoker quit 2013, he is still vaping in replacement, started 64 yo to 2013, 20+ years. Worse at night time.    Cough This is a chronic problem. The current episode started more than 1 year ago. The problem has been gradually worsening. The problem occurs every few hours. The cough is productive of purulent sputum. Associated symptoms include heartburn, nasal congestion, rhinorrhea, shortness of breath, sweats, weight loss and wheezing. Pertinent negatives include no chest pain, chills, ear congestion, fever, headaches, hemoptysis, myalgias, postnasal drip, rash or sore throat. Risk factors for lung disease include smoking/tobacco exposure (Was in the [employer] until 1983). He has tried nothing (except cbg oil ) for the symptoms. His past medical history is significant for bronchitis, COPD and emphysema. There is no history of environmental allergies.     Past Medical History:  Diagnosis Date  . Aortic regurgitation   . Atherosclerosis of native arteries of the extremities with intermittent claudication 09/08/2013  . GERD (gastroesophageal reflux disease)   . Heart murmur   . Hyperlipidemia   . Hypertension   . Peripheral vascular disease with claudication    ABI .49    . Substance abuse (HCC)      Family History  Problem Relation Age of Onset  . Other Brother   . Hypertension Brother   . Hyperlipidemia Brother   . Arthritis Mother   . Hypertension Mother   . Hyperlipidemia Mother   . Colon cancer Mother        62's  . Hypertension Sister   . Hyperlipidemia Sister   . Diabetes Sister   . Heart attack Father   .  Hyperlipidemia Brother      Past Surgical History:  Procedure Laterality Date  . NO PAST SURGERIES      Social History   Socioeconomic History  . Marital status: Married    Spouse name: Not on file  . Number of children: Not on file  . Years of education: Not on file  . Highest education level: Not on file  Occupational History  . Occupation: Recruitment consultant  Tobacco Use  . Smoking status: Former Smoker    Packs/day: 1.00    Years: 30.00    Pack years: 30.00    Types: Cigarettes    Quit date: 12/21/2011    Years since quitting: 7.9  . Smokeless tobacco: Never Used  Vaping Use  . Vaping Use: Every day  Substance and Sexual Activity  . Alcohol use: Yes    Comment: Ocassionaly.   . Drug use: Not Currently    Frequency: 1.0 times per week    Types: Marijuana  . Sexual activity: Yes    Birth control/protection: None  Other Topics Concern  . Not on file  Social History Narrative   Married. Education: [employer]. Exercise:  "Somewhat".   Social Determinants of Health   Financial Resource Strain:   . Difficulty of Paying Living Expenses: Not on file  Food Insecurity:   . Worried About Programme researcher, broadcasting/film/video in the Last Year: Not on file  . Ran Out of Food  in the Last Year: Not on file  Transportation Needs:   . Lack of Transportation (Medical): Not on file  . Lack of Transportation (Non-Medical): Not on file  Physical Activity:   . Days of Exercise per Week: Not on file  . Minutes of Exercise per Session: Not on file  Stress:   . Feeling of Stress : Not on file  Social Connections:   . Frequency of Communication with Friends and Family: Not on file  . Frequency of Social Gatherings with Friends and Family: Not on file  . Attends Religious Services: Not on file  . Active Member of Clubs or Organizations: Not on file  . Attends Banker Meetings: Not on file  . Marital Status: Not on file  Intimate Partner Violence:   . Fear of Current or Ex-Partner:  Not on file  . Emotionally Abused: Not on file  . Physically Abused: Not on file  . Sexually Abused: Not on file     Allergies  Allergen Reactions  . Lipitor [Atorvastatin]     myalgia  . Pravastatin Other (See Comments)    myalgia     Outpatient Medications Prior to Visit  Medication Sig Dispense Refill  . aspirin 81 MG tablet Take 81 mg by mouth daily.    . Cholecalciferol (VITAMIN D3) 1000 UNITS CAPS Take 1 capsule by mouth daily.    . Coenzyme Q10 (CO Q 10 PO) Take 1 tablet by mouth daily.    . Cyanocobalamin (VITAMIN B-12 PO) 1 tablet daily.    . fluticasone (FLONASE) 50 MCG/ACT nasal spray Place 1-2 sprays into both nostrils daily. 16 g 6  . KRILL OIL PO Take 1 tablet by mouth daily.    Marland Kitchen lisinopril-hydrochlorothiazide (ZESTORETIC) 20-12.5 MG tablet Take 2 tablets by mouth daily. 180 tablet 3  . MAGNESIUM PO Take 1 tablet by mouth daily.    . Multiple Vitamins-Minerals (CENTRUM SILVER PO) Take 1 tablet by mouth daily.    . Multiple Vitamins-Minerals (ZINC PO) Take 1 tablet by mouth daily.    Marland Kitchen omeprazole (PRILOSEC) 20 MG capsule Take 1 capsule (20 mg total) by mouth daily. 90 capsule 1  . rosuvastatin (CRESTOR) 5 MG tablet Take 1 tablet (5 mg total) by mouth at bedtime. 90 tablet 1  . TURMERIC PO Take 1 tablet by mouth daily.      No facility-administered medications prior to visit.    Review of Systems  Constitutional: Positive for weight loss. Negative for chills, fever and malaise/fatigue.  HENT: Positive for rhinorrhea. Negative for hearing loss, postnasal drip, sore throat and tinnitus.   Eyes: Negative for blurred vision and double vision.  Respiratory: Positive for cough, shortness of breath and wheezing. Negative for hemoptysis, sputum production and stridor.   Cardiovascular: Negative for chest pain, palpitations, orthopnea, leg swelling and PND.  Gastrointestinal: Positive for heartburn. Negative for abdominal pain, constipation, diarrhea, nausea and vomiting.   Genitourinary: Negative for dysuria, hematuria and urgency.  Musculoskeletal: Negative for joint pain and myalgias.  Skin: Negative for itching and rash.  Neurological: Negative for dizziness, tingling, weakness and headaches.  Endo/Heme/Allergies: Negative for environmental allergies. Does not bruise/bleed easily.  Psychiatric/Behavioral: Negative for depression. The patient is not nervous/anxious and does not have insomnia.   All other systems reviewed and are negative.    Objective:  Physical Exam Vitals reviewed.  Constitutional:      General: He is not in acute distress.    Appearance: He is well-developed.  HENT:  Head: Normocephalic and atraumatic.  Eyes:     General: No scleral icterus.    Conjunctiva/sclera: Conjunctivae normal.     Pupils: Pupils are equal, round, and reactive to light.  Neck:     Vascular: No JVD.     Trachea: No tracheal deviation.  Cardiovascular:     Rate and Rhythm: Normal rate and regular rhythm.     Heart sounds: Normal heart sounds. No murmur heard.   Pulmonary:     Effort: Pulmonary effort is normal. No tachypnea, accessory muscle usage or respiratory distress.     Breath sounds: Normal breath sounds. No stridor. No wheezing, rhonchi or rales.  Abdominal:     General: Bowel sounds are normal. There is no distension.     Palpations: Abdomen is soft.     Tenderness: There is no abdominal tenderness.  Musculoskeletal:        General: No tenderness.     Cervical back: Neck supple.  Lymphadenopathy:     Cervical: No cervical adenopathy.  Skin:    General: Skin is warm and dry.     Capillary Refill: Capillary refill takes less than 2 seconds.     Findings: No rash.  Neurological:     Mental Status: He is alert and oriented to person, place, and time.  Psychiatric:        Behavior: Behavior normal.      Vitals:   12/15/19 1041  BP: 128/66  Pulse: 71  Temp: 97.6 F (36.4 C)  TempSrc: Tympanic  SpO2: 99%  Weight: 155 lb 4 oz  (70.4 kg)  Height: 5\' 9"  (1.753 m)   99% on RA BMI Readings from Last 3 Encounters:  12/15/19 22.93 kg/m  10/25/19 23.30 kg/m  04/26/19 24.54 kg/m   Wt Readings from Last 3 Encounters:  12/15/19 155 lb 4 oz (70.4 kg)  10/25/19 157 lb 12.8 oz (71.6 kg)  04/26/19 166 lb 3.2 oz (75.4 kg)     CBC    Component Value Date/Time   WBC 7.0 08/27/2014 1511   RBC 4.16 (L) 08/27/2014 1511   HGB 12.1 (L) 08/27/2014 1511   HCT 36.9 (L) 08/27/2014 1511   PLT 239 08/27/2014 1511   MCV 88.7 08/27/2014 1511   MCH 29.1 08/27/2014 1511   MCHC 32.8 08/27/2014 1511   RDW 14.0 08/27/2014 1511   LYMPHSABS 2.1 12/21/2011 0656   MONOABS 0.6 12/21/2011 0656   EOSABS 0.1 12/21/2011 0656   BASOSABS 0.0 12/21/2011 0656     Chest Imaging: Lung cancer screening CT: 03/27/2019: Evidence of bronchial thickening and atherosclerosis, lung RADS 2 read. Paraseptal and centrilobular emphysema.  Pulmonary Functions Testing Results: No flowsheet data found.  FeNO:   Pathology:  Echocardiogram:   Heart Catheterization:     Assessment & Plan:     ICD-10-CM   1. Cough  R05.9 Pulmonary Function Test  2. Former smoker  Z87.891   3. Vaping nicotine dependence, non-tobacco product  F17.200   4. Sputum production  R05.8   5. Gastroesophageal reflux disease without esophagitis  K21.9   6. Centrilobular emphysema (HCC)  J43.2   7. Encounter for smoking cessation counseling  Z71.6     Discussion:  This is a 64 year old gentleman longstanding history of smoking, quit in 2013 however replaced with vaping.  He had CT scan imaging with paraseptal emphysema.  No prior pulmonary function test but likely has COPD.  Plan: Start Anoro Ellipta Patient was counseled on appropriate inhaler use.  We used a  placebo device today in the office and patient was instructed on appropriate technique.  Patient also demonstrated appropriate technique with the use of the device.  Albuterol prescription given for  shortness of breath and wheezing as needed. We will order full pulmonary function test and have patient follow-up in the office after this.  Patient was also counseled on smoking cessation.  Continued enrollment in lung cancer screening program.  Smoking Cessation Counseling:   The patient's current tobacco use: vaping nicotine products  The patient was advised to quit and impact of smoking on their health.  I assessed the patient's willingness to attempt to quit. I provided methods and skills for cessation.  We discussed tapering method.  We reviewed medication management of smoking session drugs if appropriate.  Patient is agreeable to use of Chantix.  New prescription given. Resources to help quit smoking were provided. A smoking cessation quit date was set: Jan 1 Follow-up was arranged in our clinic.  The amount of time spent counseling patient was 5 mins, separate of office note time      Current Outpatient Medications:  .  aspirin 81 MG tablet, Take 81 mg by mouth daily., Disp: , Rfl:  .  Cholecalciferol (VITAMIN D3) 1000 UNITS CAPS, Take 1 capsule by mouth daily., Disp: , Rfl:  .  Coenzyme Q10 (CO Q 10 PO), Take 1 tablet by mouth daily., Disp: , Rfl:  .  Cyanocobalamin (VITAMIN B-12 PO), 1 tablet daily., Disp: , Rfl:  .  fluticasone (FLONASE) 50 MCG/ACT nasal spray, Place 1-2 sprays into both nostrils daily., Disp: 16 g, Rfl: 6 .  KRILL OIL PO, Take 1 tablet by mouth daily., Disp: , Rfl:  .  lisinopril-hydrochlorothiazide (ZESTORETIC) 20-12.5 MG tablet, Take 2 tablets by mouth daily., Disp: 180 tablet, Rfl: 3 .  MAGNESIUM PO, Take 1 tablet by mouth daily., Disp: , Rfl:  .  Multiple Vitamins-Minerals (CENTRUM SILVER PO), Take 1 tablet by mouth daily., Disp: , Rfl:  .  Multiple Vitamins-Minerals (ZINC PO), Take 1 tablet by mouth daily., Disp: , Rfl:  .  omeprazole (PRILOSEC) 20 MG capsule, Take 1 capsule (20 mg total) by mouth daily., Disp: 90 capsule, Rfl: 1 .  rosuvastatin  (CRESTOR) 5 MG tablet, Take 1 tablet (5 mg total) by mouth at bedtime., Disp: 90 tablet, Rfl: 1 .  TURMERIC PO, Take 1 tablet by mouth daily. , Disp: , Rfl:    Josephine IgoBradley L Debborah Alonge, DO [facility] 12/15/2019 10:54 AM

## 2019-12-18 ENCOUNTER — Telehealth: Payer: Self-pay | Admitting: Pulmonary Disease

## 2019-12-18 NOTE — Telephone Encounter (Signed)
Called and spoke with pt who stated the anoro is too expensive and he is unable to afford it. Stated to pt to contact insurance company and have them provide him a formulary list of covered inhalers so we could further discuss that info with Dr. Tonia Brooms and he verbalized understanding.  While speaking with pt, he also stated that Chantix has been recalled. Dr. Tonia Brooms, please advise.

## 2019-12-18 NOTE — Telephone Encounter (Signed)
Irving Burton,  I think the starter [place] pack was recalled. Can you work with the pharmacy to dispense the individual pills instead of the starter pack.   May his insurance covers stiolto?   Thanks  BLI

## 2019-12-19 MED ORDER — STIOLTO RESPIMAT 2.5-2.5 MCG/ACT IN AERS: 2.0000 | INHALATION_SPRAY | Freq: Every day | RESPIRATORY_TRACT | 5 refills | Status: DC

## 2019-12-19 NOTE — Telephone Encounter (Signed)
Called pt's pharmacy and spoke with pharmacist Gaynell Face and provided her verbal information for the chantix 0.5mg  loose tab as well as for the 1mg  loose tab.  Rx for Stiolto has also been sent to pharmacy for pt. Called and spoke with pt letting him know all this info and he verbalized understanding. Nothing further needed.

## 2020-02-19 ENCOUNTER — Encounter: Payer: Self-pay | Admitting: Pulmonary Disease

## 2020-02-19 ENCOUNTER — Ambulatory Visit (INDEPENDENT_AMBULATORY_CARE_PROVIDER_SITE_OTHER): Payer: Commercial Managed Care - PPO | Admitting: Pulmonary Disease

## 2020-02-19 ENCOUNTER — Other Ambulatory Visit: Payer: Self-pay

## 2020-02-19 ENCOUNTER — Ambulatory Visit: Payer: Commercial Managed Care - PPO | Admitting: Pulmonary Disease

## 2020-02-19 VITALS — BP 110/60 | HR 62 | Temp 97.2°F | Ht 69.75 in | Wt 157.5 lb

## 2020-02-19 DIAGNOSIS — R059 Cough, unspecified: Secondary | ICD-10-CM | POA: Diagnosis not present

## 2020-02-19 DIAGNOSIS — F17210 Nicotine dependence, cigarettes, uncomplicated: Secondary | ICD-10-CM

## 2020-02-19 DIAGNOSIS — Z716 Tobacco abuse counseling: Secondary | ICD-10-CM

## 2020-02-19 DIAGNOSIS — J432 Centrilobular emphysema: Secondary | ICD-10-CM

## 2020-02-19 DIAGNOSIS — F17200 Nicotine dependence, unspecified, uncomplicated: Secondary | ICD-10-CM

## 2020-02-19 LAB — PULMONARY FUNCTION TEST
DL/VA % pred: 100 %
DL/VA: 4.18 ml/min/mmHg/L
DLCO cor % pred: 91 %
DLCO cor: 24.45 ml/min/mmHg
DLCO unc % pred: 91 %
DLCO unc: 24.45 ml/min/mmHg
FEF 25-75 Post: 2.74 L/sec
FEF 25-75 Pre: 2.73 L/sec
FEF2575-%Change-Post: 0 %
FEF2575-%Pred-Post: 100 %
FEF2575-%Pred-Pre: 100 %
FEV1-%Change-Post: 0 %
FEV1-%Pred-Post: 107 %
FEV1-%Pred-Pre: 106 %
FEV1-Post: 3.22 L
FEV1-Pre: 3.2 L
FEV1FVC-%Change-Post: 0 %
FEV1FVC-%Pred-Pre: 99 %
FEV6-%Change-Post: 0 %
FEV6-%Pred-Post: 109 %
FEV6-%Pred-Pre: 109 %
FEV6-Post: 4.11 L
FEV6-Pre: 4.09 L
FEV6FVC-%Change-Post: 0 %
FEV6FVC-%Pred-Post: 102 %
FEV6FVC-%Pred-Pre: 103 %
FVC-%Change-Post: 0 %
FVC-%Pred-Post: 106 %
FVC-%Pred-Pre: 106 %
FVC-Post: 4.17 L
FVC-Pre: 4.15 L
Post FEV1/FVC ratio: 77 %
Post FEV6/FVC ratio: 99 %
Pre FEV1/FVC ratio: 77 %
Pre FEV6/FVC Ratio: 99 %
RV % pred: 104 %
RV: 2.42 L
TLC % pred: 90 %
TLC: 6.33 L

## 2020-02-19 MED ORDER — NICOTINE 10 MG IN INHA: 1.0000 | RESPIRATORY_TRACT | 0 refills | Status: DC | PRN

## 2020-02-19 NOTE — Patient Instructions (Addendum)
Thank you for visiting Dr. Tonia Brooms at [facility]. Today we recommend the following:  Meds ordered this encounter  Medications  . nicotine (NICOTROL) 10 MG inhaler    Sig: Inhale 1 Cartridge (1 continuous puffing total) into the lungs as needed for smoking cessation.    Dispense:  168 each    Refill:  0   Return in about 6 months (around 08/18/2020), or if symptoms worsen or fail to improve, for with APP or Dr. Tonia Brooms.    Please do your part to reduce the spread of COVID-19.  You must quit smoking or vaping. This is the single most important thing that you can do to improve your lung health.   S = Set a quit date. T = Tell family, friends, and the people around you that you plan to quit. A = Anticipate or plan ahead for the tough times you'll face while quitting. R = Remove cigarettes and other tobacco products from your home, car, and work T = Talk to [place] about getting help to quit  If you need help feel free to reach out to our office, [facility] Smoking Cessation Class: [phone], call 1-800-QUIT-NOW, or visit [email or web address].

## 2020-02-19 NOTE — Progress Notes (Signed)
PFT done today. 

## 2020-02-19 NOTE — Progress Notes (Signed)
Synopsis: Referred in nov 2021 for  cough by [name], MD  Subjective:   PATIENT ID: Albert Hardy. GENDER: male DOB: 03-31-1955, MRN: [id number]  Chief Complaint  Patient presents with  . Follow-up    After PFT    PMH HTN, HLD, PVD, GERD, here for chronic cough evaluation. Former smoker quit 2013, he is still vaping in replacement, started 65 yo to 2013, 20+ years. Worse at night time.  Last office visit evaluated for chronic cough for more than 1 year, productive of sputum associated with heartburn, nasal congestion, rhinorrhea, shortness of breath.  He was in the [employer] until 1983.  He has used CBD oil.  No significant environmental allergens.  OV 02/19/2020: Here today for follow-up after pulmonary function test.  Patient had pulmonary function tests completed which revealed a ratio of 77, FEV1 3.2 L, 106% predicted, TLC 90% predicted, DLCO 91% predicted no evidence of obstruction.  He unfortunately has not been able to quit vaping.  He has continued this.  Unable to afford the Chantix which was greater than $400.  He has been able to cut down working on tapering.  But from a respiratory standpoint feels okay.  He does occasionally has some dyspnea with exertion.  Denies fevers chills night sweats weight loss or hemoptysis.  Patient's cough has resolved with initiation of Anoro.     Past Medical History:  Diagnosis Date  . Aortic regurgitation   . Atherosclerosis of native arteries of the extremities with intermittent claudication 09/08/2013  . GERD (gastroesophageal reflux disease)   . Heart murmur   . Hyperlipidemia   . Hypertension   . Peripheral vascular disease with claudication    ABI .49    . Substance abuse (HCC)      Family History  Problem Relation Age of Onset  . Other Brother   . Hypertension Brother   . Hyperlipidemia Brother   . Arthritis Mother   . Hypertension Mother   . Hyperlipidemia Mother   . Colon cancer Mother        22's  . Hypertension  Sister   . Hyperlipidemia Sister   . Diabetes Sister   . Heart attack Father   . Hyperlipidemia Brother      Past Surgical History:  Procedure Laterality Date  . NO PAST SURGERIES      Social History   Socioeconomic History  . Marital status: Married    Spouse name: Not on file  . Number of children: Not on file  . Years of education: Not on file  . Highest education level: Not on file  Occupational History  . Occupation: Recruitment consultant  Tobacco Use  . Smoking status: Former Smoker    Packs/day: 1.00    Years: 30.00    Pack years: 30.00    Types: Cigarettes    Quit date: 12/21/2011    Years since quitting: 8.1  . Smokeless tobacco: Never Used  Vaping Use  . Vaping Use: Every day  Substance and Sexual Activity  . Alcohol use: Yes    Comment: Ocassionaly.   . Drug use: Not Currently    Frequency: 1.0 times per week    Types: Marijuana  . Sexual activity: Yes    Birth control/protection: None  Other Topics Concern  . Not on file  Social History Narrative   Married. Education: [employer]. Exercise:  "Somewhat".   Social Determinants of Health   Financial Resource Strain: Not on file  Food Insecurity: Not on  file  Transportation Needs: Not on file  Physical Activity: Not on file  Stress: Not on file  Social Connections: Not on file  Intimate Partner Violence: Not on file     Allergies  Allergen Reactions  . Lipitor [Atorvastatin]     myalgia  . Pravastatin Other (See Comments)    myalgia     Outpatient Medications Prior to Visit  Medication Sig Dispense Refill  . albuterol (VENTOLIN HFA) 108 (90 Base) MCG/ACT inhaler Inhale 2 puffs into the lungs every 6 (six) hours as needed for wheezing or shortness of breath. 8 g 6  . ANORO ELLIPTA 62.5-25 MCG/INH AEPB 1 puff daily.    Marland Kitchen aspirin 81 MG tablet Take 81 mg by mouth daily.    . Cholecalciferol (VITAMIN D3) 1000 UNITS CAPS Take 1 capsule by mouth daily.    . Coenzyme Q10 (CO Q 10 PO) Take 1 tablet by  mouth daily.    . Cyanocobalamin (VITAMIN B-12 PO) 1 tablet daily.    . fluticasone (FLONASE) 50 MCG/ACT nasal spray Place 1-2 sprays into both nostrils daily. 16 g 6  . KRILL OIL PO Take 1 tablet by mouth daily.    Marland Kitchen lisinopril-hydrochlorothiazide (ZESTORETIC) 20-12.5 MG tablet Take 2 tablets by mouth daily. 180 tablet 3  . MAGNESIUM PO Take 1 tablet by mouth daily.    . Multiple Vitamins-Minerals (CENTRUM SILVER PO) Take 1 tablet by mouth daily.    . Multiple Vitamins-Minerals (ZINC PO) Take 1 tablet by mouth daily.    Marland Kitchen omeprazole (PRILOSEC) 20 MG capsule Take 1 capsule (20 mg total) by mouth daily. 90 capsule 1  . rosuvastatin (CRESTOR) 5 MG tablet Take 1 tablet (5 mg total) by mouth at bedtime. 90 tablet 1  . TURMERIC PO Take 1 tablet by mouth daily.     . Tiotropium Bromide-Olodaterol (STIOLTO RESPIMAT) 2.5-2.5 MCG/ACT AERS Inhale 2 puffs into the lungs daily. 4 g 5  . varenicline (CHANTIX PAK) 0.5 MG X 11 & 1 MG X 42 tablet Take one 0.5 mg tablet by mouth once daily for 3 days, then increase to one 0.5 mg tablet twice daily for 4 days, then increase to one 1 mg tablet twice daily. 53 tablet 0   No facility-administered medications prior to visit.    Review of Systems  Constitutional: Negative for chills, fever, malaise/fatigue and weight loss.  HENT: Negative for hearing loss, sore throat and tinnitus.   Eyes: Negative for blurred vision and double vision.  Respiratory: Positive for cough. Negative for hemoptysis, sputum production, shortness of breath, wheezing and stridor.   Cardiovascular: Negative for chest pain, palpitations, orthopnea, leg swelling and PND.  Gastrointestinal: Negative for abdominal pain, constipation, diarrhea, heartburn, nausea and vomiting.  Genitourinary: Negative for dysuria, hematuria and urgency.  Musculoskeletal: Negative for joint pain and myalgias.  Skin: Negative for itching and rash.  Neurological: Negative for dizziness, tingling, weakness and  headaches.  Endo/Heme/Allergies: Negative for environmental allergies. Does not bruise/bleed easily.  Psychiatric/Behavioral: Negative for depression. The patient is not nervous/anxious and does not have insomnia.   All other systems reviewed and are negative.    Objective:  Physical Exam Vitals reviewed.  Constitutional:      General: He is not in acute distress.    Appearance: He is well-developed and well-nourished.  HENT:     Head: Normocephalic and atraumatic.     Mouth/Throat:     Mouth: Oropharynx is clear and moist.  Eyes:     General: No  scleral icterus.    Conjunctiva/sclera: Conjunctivae normal.     Pupils: Pupils are equal, round, and reactive to light.  Neck:     Vascular: No JVD.     Trachea: No tracheal deviation.  Cardiovascular:     Rate and Rhythm: Normal rate and regular rhythm.     Pulses: Intact distal pulses.     Heart sounds: Normal heart sounds. No murmur heard.   Pulmonary:     Effort: Pulmonary effort is normal. No tachypnea, accessory muscle usage or respiratory distress.     Breath sounds: Normal breath sounds. No stridor. No wheezing, rhonchi or rales.  Abdominal:     General: Bowel sounds are normal. There is no distension.     Palpations: Abdomen is soft.     Tenderness: There is no abdominal tenderness.  Musculoskeletal:        General: No tenderness or edema.     Cervical back: Neck supple.  Lymphadenopathy:     Cervical: No cervical adenopathy.  Skin:    General: Skin is warm and dry.     Capillary Refill: Capillary refill takes less than 2 seconds.     Findings: No rash.  Neurological:     Mental Status: He is alert and oriented to person, place, and time.  Psychiatric:        Mood and Affect: Mood and affect normal.        Behavior: Behavior normal.      Vitals:   02/19/20 1005  BP: 110/60  Pulse: 62  Temp: (!) 97.2 F (36.2 C)  TempSrc: Tympanic  SpO2: 98%  Weight: 157 lb 8 oz (71.4 kg)  Height: 5' 9.75" (1.772 m)    98% on RA BMI Readings from Last 3 Encounters:  02/19/20 22.76 kg/m  12/15/19 22.93 kg/m  10/25/19 23.30 kg/m   Wt Readings from Last 3 Encounters:  02/19/20 157 lb 8 oz (71.4 kg)  12/15/19 155 lb 4 oz (70.4 kg)  10/25/19 157 lb 12.8 oz (71.6 kg)     CBC    Component Value Date/Time   WBC 7.0 08/27/2014 1511   RBC 4.16 (L) 08/27/2014 1511   HGB 12.1 (L) 08/27/2014 1511   HCT 36.9 (L) 08/27/2014 1511   PLT 239 08/27/2014 1511   MCV 88.7 08/27/2014 1511   MCH 29.1 08/27/2014 1511   MCHC 32.8 08/27/2014 1511   RDW 14.0 08/27/2014 1511   LYMPHSABS 2.1 12/21/2011 0656   MONOABS 0.6 12/21/2011 0656   EOSABS 0.1 12/21/2011 0656   BASOSABS 0.0 12/21/2011 0656     Chest Imaging: Lung cancer screening CT: 03/27/2019: Evidence of bronchial thickening and atherosclerosis, lung RADS 2 read. Paraseptal and centrilobular emphysema.  Pulmonary Functions Testing Results: No flowsheet data found.  FeNO:   Pathology:  Echocardiogram:   Heart Catheterization:     Assessment & Plan:     ICD-10-CM   1. Cough  R05.9   2. Centrilobular emphysema (HCC)  J43.2   3. Encounter for smoking cessation counseling  Z71.6   4. Vaping nicotine dependence, non-tobacco product  F17.200     Discussion:  This is a 65 year old gentleman longstanding history of smoking, transition to vaping in 2013.  He has CT scan imaging with paraseptal emphysema likely related to his longstanding history of tobacco abuse.  Pulmonary function test completed today with no significant obstruction, normal ratio, FEV1 3.2 L, 100% predicted, TLC 90%, DLCO 91%.  Pulmonary function tests were reviewed and interpreted today in the  office with patient.  Plan: Patient's cough has improved with initiation of Anoro.  Continue this at this time. He does have evidence of emphysema on CT imaging.  However PFTs are reassuring with no obstruction. Patient again counseled today on smoking cessation.  Please see separate  documentation regarding this. Continue albuterol for shortness of breath and wheezing. Continued enrollment in our lung cancer screening program. We will have patient to follow-up with us in approximately 6 months.     Current Outpatient Medications:  .  albuterol (VENTOLIN HFA) 108 (90 Base) MCG/ACT inhaler, Inhale 2 puffs into the lungs every 6 (six) hours as needed for wheezing or shortness of breath., Disp: 8 g, Rfl: 6 .  ANORO ELLIPTA 62.5-25 MCG/INH AEPB, 1 puff daily., Disp: , Rfl:  .  aspirin 81 MG tablet, Take 81 mg by mouth daily., Disp: , Rfl:  .  Cholecalciferol (VITAMIN D3) 1000 UNITS CAPS, Take 1 capsule by mouth daily., Disp: , Rfl:  .  Coenzyme Q10 (CO Q 10 PO), Take 1 tablet by mouth daily., Disp: , Rfl:  .  Cyanocobalamin (VITAMIN B-12 PO), 1 tablet daily., Disp: , Rfl:  .  fluticasone (FLONASE) 50 MCG/ACT nasal spray, Place 1-2 sprays into both nostrils daily., Disp: 16 g, Rfl: 6 .  KRILL OIL PO, Take 1 tablet by mouth daily., Disp: , Rfl:  .  lisinopril-hydrochlorothiazide (ZESTORETIC) 20-12.5 MG tablet, Take 2 tablets by mouth daily., Disp: 180 tablet, Rfl: 3 .  MAGNESIUM PO, Take 1 tablet by mouth daily., Disp: , Rfl:  .  Multiple Vitamins-Minerals (CENTRUM SILVER PO), Take 1 tablet by mouth daily., Disp: , Rfl:  .  Multiple Vitamins-Minerals (ZINC PO), Take 1 tablet by mouth daily., Disp: , Rfl:  .  nicotine (NICOTROL) 10 MG inhaler, Inhale 1 Cartridge (1 continuous puffing total) into the lungs as needed for smoking cessation., Disp: 168 each, Rfl: 0 .  omeprazole (PRILOSEC) 20 MG capsule, Take 1 capsule (20 mg total) by mouth daily., Disp: 90 capsule, Rfl: 1 .  rosuvastatin (CRESTOR) 5 MG tablet, Take 1 tablet (5 mg total) by mouth at bedtime., Disp: 90 tablet, Rfl: 1 .  TURMERIC PO, Take 1 tablet by mouth daily. , Disp: , Rfl:    Josephine IgoBradley L Tangela Dolliver, DO [facility] 02/19/2020 10:25 AM

## 2020-02-19 NOTE — Progress Notes (Signed)
Smoking Cessation Counseling:   The patient's current tobacco use: vaping nicotine  The patient was advised to quit and impact of smoking on their health.  I assessed the patient's willingness to attempt to quit. I provided methods and skills for cessation.  New prescription given for nicotine inhaler. We reviewed medication management of smoking session drugs if appropriate. Resources to help quit smoking were provided. A smoking cessation quit date was set: May 1st Follow-up was arranged in our clinic.  The amount of time spent counseling patient was 5 mins    Josephine Igo, DO [facility] 02/19/2020 10:29 AM

## 2020-04-24 ENCOUNTER — Encounter: Payer: Self-pay | Admitting: Family Medicine

## 2020-04-24 ENCOUNTER — Telehealth (INDEPENDENT_AMBULATORY_CARE_PROVIDER_SITE_OTHER): Payer: Commercial Managed Care - PPO | Admitting: Family Medicine

## 2020-04-24 ENCOUNTER — Other Ambulatory Visit: Payer: Self-pay

## 2020-04-24 VITALS — Ht 69.0 in | Wt 162.0 lb

## 2020-04-24 DIAGNOSIS — E785 Hyperlipidemia, unspecified: Secondary | ICD-10-CM | POA: Diagnosis not present

## 2020-04-24 DIAGNOSIS — J3489 Other specified disorders of nose and nasal sinuses: Secondary | ICD-10-CM | POA: Diagnosis not present

## 2020-04-24 DIAGNOSIS — R059 Cough, unspecified: Secondary | ICD-10-CM | POA: Diagnosis not present

## 2020-04-24 DIAGNOSIS — R739 Hyperglycemia, unspecified: Secondary | ICD-10-CM | POA: Diagnosis not present

## 2020-04-24 DIAGNOSIS — K219 Gastro-esophageal reflux disease without esophagitis: Secondary | ICD-10-CM

## 2020-04-24 DIAGNOSIS — I10 Essential (primary) hypertension: Secondary | ICD-10-CM

## 2020-04-24 MED ORDER — LISINOPRIL-HYDROCHLOROTHIAZIDE 20-12.5 MG PO TABS: 2.0000 | ORAL_TABLET | Freq: Every day | ORAL | 3 refills | Status: DC

## 2020-04-24 MED ORDER — OMEPRAZOLE 20 MG PO CPDR: 20.0000 mg | DELAYED_RELEASE_CAPSULE | Freq: Every day | ORAL | 1 refills | Status: DC

## 2020-04-24 MED ORDER — FLUTICASONE PROPIONATE 50 MCG/ACT NA SUSP: 1.0000 | Freq: Every day | NASAL | 6 refills | Status: AC

## 2020-04-24 MED ORDER — ROSUVASTATIN CALCIUM 5 MG PO TABS: 5.0000 mg | ORAL_TABLET | Freq: Every day | ORAL | 1 refills | Status: DC

## 2020-04-24 NOTE — Patient Instructions (Addendum)
Good talking to you today.  I am glad you are doing well.  No medication changes at this time, but please have lab work done within the next week if possible.  Those should be fasting labs.  Please let me know if there are questions and look forward to seeing you in 6 months at my new office.  Feel free to call for appointment sooner if needed.  Here are a few lab drawing stations for you to have your labwork performed:  [facility] [address] [place], [place] [address]  [facility] [address] [place], [place] [address]    If you have lab work done today you will be contacted with your lab results within the next 2 weeks.  If you have not heard from [place] then please contact us. The fastest way to get your results is to register for My Chart.   IF you received an x-ray today, you will receive an invoice from [facility]. Please contact [facility] at [phone] with questions or concerns regarding your invoice.   IF you received labwork today, you will receive an invoice from [facility]. Please contact [facility] at [phone] with questions or concerns regarding your invoice.   Our billing staff will not be able to assist you with questions regarding bills from these companies.  You will be contacted with the lab results as soon as they are available. The fastest way to get your results is to activate your My Chart account. Instructions are located on the last page of this paperwork. If you have not heard from [place] regarding the results in 2 weeks, please contact this office.

## 2020-04-24 NOTE — Progress Notes (Signed)
Virtual Visit via Video Note  I connected with Albert Hardy. on 04/24/20 at 2:42 PM by a video enabled telemedicine application and verified that I am speaking with the correct person using two identifiers.  Patient location: home My location: home    I discussed the limitations, risks, security and privacy concerns of performing an evaluation and management service by telephone and the availability of in person appointments. I also discussed with the patient that there may be a patient responsible charge related to this service. The patient expressed understanding and agreed to proceed, consent obtained  Chief complaint:  Chief Complaint  Patient presents with  . Medical Management of Chronic Issues    6 month f/u     History of Present Illness: Albert Hardy. is a 65 y.o. male   Hypertension: Hx of aortic valve insufficiency.  Cardiology Dr. Dulce Sellar.  Lisinopril 40mg  qd, hctz 25qd.  No new side effects.  Home readings: 110/60 range.  Constitutional: Negative for fatigue and unexpected weight change.  Eyes: Negative for visual disturbance.  Respiratory: Negative for cough, chest tightness and shortness of breath.   Cardiovascular: Negative for chest pain, palpitations and leg swelling.  Gastrointestinal: Negative for abdominal pain and blood in stool.  Neurological: Negative for dizziness, light-headedness and headaches.   BP Readings from Last 3 Encounters:  02/19/20 110/60  12/15/19 128/66  10/25/19 138/66   Lab Results  Component Value Date   CREATININE 1.18 10/25/2019   GERD: Stable with pepcid as needed, and omeprazole daily.  No blood in stool. No breakthough No hx of PUD Intermittent dosing option discussed.   Hyperlipidemia: Crestor 5mg  qod.  Starting to exercise more.  Lab Results  Component Value Date   CHOL 220 (H) 10/25/2019   HDL 73 10/25/2019   LDLCALC 137 (H) 10/25/2019   TRIG 60 10/25/2019   CHOLHDL 3.0 10/25/2019   Lab Results  Component  Value Date   ALT 13 10/25/2019   AST 15 10/25/2019   ALKPHOS 64 10/25/2019   BILITOT <0.2 10/25/2019    Prediabetes:  Lab Results  Component Value Date   HGBA1C 5.8 (H) 10/25/2019   Wt Readings from Last 3 Encounters:  04/24/20 162 lb (73.5 kg)  02/19/20 157 lb 8 oz (71.4 kg)  12/15/19 155 lb 4 oz (70.4 kg)   On Anoro Ellipta - working well for cough. followed by pulmonary - Dr 04/18/20, appt in January  flonase working well for allergies.   Patient Active Problem List   Diagnosis Date Noted  . Bilateral sensorineural hearing loss 12/14/2018  . Bilateral impacted cerumen 10/13/2018  . 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 Aortic stenosis 04/04/2018  . Atherosclerosis of native arteries of the extremities with intermittent claudication 09/08/2013  . Former moderate cigarette smoker (10-19 per day) 05/20/2012  . Peripheral vascular disease with claudication   . Hyperlipidemia   . Aortic regurgitation   . GERD (gastroesophageal reflux disease)   . Hypertension    Past Medical History:  Diagnosis Date  . Aortic regurgitation   . Atherosclerosis of native arteries of the extremities with intermittent claudication 09/08/2013  . GERD (gastroesophageal reflux disease)   . Heart murmur   . Hyperlipidemia   . Hypertension   . Peripheral vascular disease with claudication    ABI .49    . Substance abuse [facility])    Past Surgical History:  Procedure Laterality Date  . NO PAST SURGERIES     Allergies  Allergen Reactions  .  Lipitor [Atorvastatin]     myalgia  . Pravastatin Other (See Comments)    myalgia   Prior to Admission medications   Medication Sig Start Date End Date Taking? Authorizing Provider  albuterol (VENTOLIN HFA) 108 (90 Base) MCG/ACT inhaler Inhale 2 puffs into the lungs every 6 (six) hours as needed for wheezing or shortness of breath. 12/15/19  Yes Icard, Bradley L, DO  ANORO ELLIPTA 62.5-25 MCG/INH AEPB 1 puff daily. 01/29/20  Yes [provider]  aspirin 81 MG tablet Take 81 mg by mouth daily.   Yes [provider]  Cholecalciferol (VITAMIN D3) 1000 UNITS CAPS Take 1 capsule by mouth daily.   Yes [provider]  Coenzyme Q10 (CO Q 10 PO) Take 1 tablet by mouth daily.   Yes [provider]  Cyanocobalamin (VITAMIN B-12 PO) 1 tablet daily.   Yes [provider]  fluticasone (FLONASE) 50 MCG/ACT nasal spray Place 1-2 sprays into both nostrils daily. 10/25/19  Yes [name], MD  KRILL OIL PO Take 1 tablet by mouth daily.   Yes [provider]  lisinopril-hydrochlorothiazide (ZESTORETIC) 20-12.5 MG tablet Take 2 tablets by mouth daily. 10/25/19  Yes [name], MD  MAGNESIUM PO Take 1 tablet by mouth daily.   Yes [provider]  Multiple Vitamins-Minerals (CENTRUM SILVER PO) Take 1 tablet by mouth daily.   Yes [provider]  Multiple Vitamins-Minerals (ZINC PO) Take 1 tablet by mouth daily.   Yes [provider]  nicotine (NICOTROL) 10 MG inhaler Inhale 1 Cartridge (1 continuous puffing total) into the lungs as needed for smoking cessation. 02/19/20  Yes Icard, Gurjit Loconte Bo, DO  omeprazole (PRILOSEC) 20 MG capsule Take 1 capsule (20 mg total) by mouth daily. 10/25/19  Yes [name], MD  rosuvastatin (CRESTOR) 5 MG tablet Take 1 tablet (5 mg total) by mouth at bedtime. 10/25/19  Yes [name], MD  TURMERIC PO Take 1 tablet by mouth daily.    Yes [provider]   Social History   Socioeconomic History  . Marital status: Married    Spouse name: Not on file  . Number of children: Not on file  . Years of education: Not on file  . Highest education level: Not on file  Occupational History  . Occupation: Recruitment consultant  Tobacco Use  . Smoking status: Former Smoker    Packs/day: 1.00    Years: 30.00    Pack years: 30.00    Types: Cigarettes    Quit date: 12/21/2011    Years since quitting: 8.3  . Smokeless  tobacco: Never Used  Vaping Use  . Vaping Use: Every day  Substance and Sexual Activity  . Alcohol use: Yes    Comment: Ocassionaly.   . Drug use: Not Currently    Frequency: 1.0 times per week    Types: Marijuana  . Sexual activity: Yes    Birth control/protection: None  Other Topics Concern  . Not on file  Social History Narrative   Married. Education: [employer]. Exercise:  "Somewhat".   Social Determinants of Health   Financial Resource Strain: Not on file  Food Insecurity: Not on file  Transportation Needs: Not on file  Physical Activity: Not on file  Stress: Not on file  Social Connections: Not on file  Intimate Partner Violence: Not on file    Observations/Objective: Vitals:   04/24/20 1113  Weight: 162 lb (73.5 kg)  Height: 5\' 9"  (1.753 m)  111/66,  110/59, 107/59 - home BP.  Temp 97.6 Weight 162.  No distress over video, nontoxic appearance, speaking full sentences, euthymic mood.  Affect mood congruent.  All questions were answered with understanding of plan expressed.  Assessment and Plan: Hyperglycemia - Plan: Hemoglobin A1c  -Check A1c, plans on increased exercise. Cough - Plan: fluticasone (FLONASE) 50 MCG/ACT nasal spray  -Improved with Anoro Ellipta, evaluation with pulmonary.  Continue Flonase for allergic/postnasal drip component which is also been helpful.  Rhinorrhea - Plan: fluticasone (FLONASE) 50 MCG/ACT nasal spray  Hyperlipidemia, unspecified hyperlipidemia type - Plan: lisinopril-hydrochlorothiazide (ZESTORETIC) 20-12.5 MG tablet, rosuvastatin (CRESTOR) 5 MG tablet, Comprehensive metabolic panel, Lipid panel  -Tolerating every other day dose Crestor, continue same  Hypertension  -Stable on current med regimen, denies lightheadedness/dizziness, RTC precautions given.  Few borderline low readings, may need to decrease doses of medications.  Continue same for now with labs planned.  Gastroesophageal reflux disease, unspecified whether esophagitis  present - Plan: omeprazole (PRILOSEC) 20 MG capsule  -Stable with current regimen of Prilosec, Pepcid, continue same with option of intermittent dosing if well controlled in the future.  Follow Up Instructions: 6 months for med recheck   I discussed the assessment and treatment plan with the patient. The patient was provided an opportunity to ask questions and all were answered. The patient agreed with the plan and demonstrated an understanding of the instructions.   The patient was advised to call back or seek an in-person evaluation if the symptoms worsen or if the condition fails to improve as anticipated.  I provided 18 minutes of non-face-to-face time during this encounter.   [name], MD

## 2020-04-29 ENCOUNTER — Other Ambulatory Visit: Payer: Self-pay | Admitting: Family Medicine

## 2020-04-30 LAB — COMPREHENSIVE METABOLIC PANEL
ALT: 16 IU/L (ref 0–44)
AST: 23 IU/L (ref 0–40)
Albumin/Globulin Ratio: 1.6 (ref 1.2–2.2)
Albumin: 4.5 g/dL (ref 3.8–4.8)
Alkaline Phosphatase: 65 IU/L (ref 44–121)
BUN/Creatinine Ratio: 13 (ref 10–24)
BUN: 15 mg/dL (ref 8–27)
Bilirubin Total: 0.3 mg/dL (ref 0.0–1.2)
CO2: 25 mmol/L (ref 20–29)
Calcium: 9.5 mg/dL (ref 8.6–10.2)
Chloride: 101 mmol/L (ref 96–106)
Creatinine, Ser: 1.17 mg/dL (ref 0.76–1.27)
Globulin, Total: 2.8 g/dL (ref 1.5–4.5)
Glucose: 98 mg/dL (ref 65–99)
Potassium: 5.3 mmol/L — ABNORMAL HIGH (ref 3.5–5.2)
Sodium: 139 mmol/L (ref 134–144)
Total Protein: 7.3 g/dL (ref 6.0–8.5)
eGFR: 70 mL/min/{1.73_m2} (ref >59)

## 2020-04-30 LAB — LIPID PANEL
Chol/HDL Ratio: 3 ratio (ref 0.0–5.0)
Cholesterol, Total: 232 mg/dL — ABNORMAL HIGH (ref 100–199)
HDL: 77 mg/dL (ref >39)
LDL Chol Calc (NIH): 150 mg/dL — ABNORMAL HIGH (ref 0–99)
Triglycerides: 32 mg/dL (ref 0–149)
VLDL Cholesterol Cal: 5 mg/dL (ref 5–40)

## 2020-04-30 LAB — HEMOGLOBIN A1C
Est. average glucose Bld gHb Est-mCnc: 117 mg/dL
Hgb A1c MFr Bld: 5.7 % — ABNORMAL HIGH (ref 4.8–5.6)

## 2020-08-19 ENCOUNTER — Other Ambulatory Visit: Payer: Self-pay

## 2020-08-19 ENCOUNTER — Ambulatory Visit (INDEPENDENT_AMBULATORY_CARE_PROVIDER_SITE_OTHER): Payer: Medicare Other | Admitting: Primary Care

## 2020-08-19 ENCOUNTER — Telehealth: Payer: Self-pay | Admitting: Primary Care

## 2020-08-19 ENCOUNTER — Encounter: Payer: Self-pay | Admitting: Primary Care

## 2020-08-19 DIAGNOSIS — Z87891 Personal history of nicotine dependence: Secondary | ICD-10-CM

## 2020-08-19 DIAGNOSIS — J431 Panlobular emphysema: Secondary | ICD-10-CM

## 2020-08-19 HISTORY — DX: Panlobular emphysema: J43.1

## 2020-08-19 MED ORDER — NICOTINE 14 MG/24HR TD PT24: 14.0000 mg | MEDICATED_PATCH | Freq: Every day | TRANSDERMAL | 0 refills | Status: DC

## 2020-08-19 MED ORDER — ANORO ELLIPTA 62.5-25 MCG/INH IN AEPB: 1.0000 | INHALATION_SPRAY | Freq: Every day | RESPIRATORY_TRACT | 11 refills | Status: DC

## 2020-08-19 MED ORDER — NICOTINE 14 MG/24HR TD PT24: MEDICATED_PATCH | TRANSDERMAL | 1 refills | Status: DC

## 2020-08-19 NOTE — Telephone Encounter (Signed)
Apart of lung cancer screening, last LDCT was in Feb 2021. Overdue for annual

## 2020-08-19 NOTE — Assessment & Plan Note (Addendum)
-   Stable interval; Former cigarette smoker, currently vapping tobacco. He has an occasional cough which improved with addition of LAMA/LABA. CAT score 14. Continue Anoro Ellipta one puff daily. Recommend he start physical exercise regimen. Strongly encourage smoking cessation, we have sent in RX for nicotine patches, 14mg /day for 6 weeks then 7mg /day x 2 weeks. Received 2nd covid booster in June 2022.

## 2020-08-19 NOTE — Assessment & Plan Note (Signed)
-   Overdue for low dose lung cancer screening, last LDCT February 2021

## 2020-08-19 NOTE — Telephone Encounter (Signed)
Will forward to [facility]) to contact pt again to schedule f/u low dose ct. New CT order has been placed.

## 2020-08-19 NOTE — Progress Notes (Signed)
@Patient  ID: ., male    DOB: [birth year], 65 y.o.   MRN: [id number]  Chief Complaint  Patient presents with   Follow-up    He reports some cough is productive and not other times.     Referring provider: [id number], MD  HPI: 65 year old male, former smoker quit in 2013 (current vapor).  Past medical history significant for hypertension, GERD, hyperlipidemia.  Patient of Dr. 2014, last seen in office on 02/19/2020 for cough.  Previous [facility] pulmonary encounter:  OV 02/19/2020: Here today for follow-up after pulmonary function test.  Patient had pulmonary function tests completed which revealed a ratio of 77, FEV1 3.2 L, 106% predicted, TLC 90% predicted, DLCO 91% predicted no evidence of obstruction.  He unfortunately has not been able to quit vaping.  He has continued this.  Unable to afford the Chantix which was greater than $400.  He has been able to cut down working on tapering.  But from a respiratory standpoint feels okay.  He does occasionally has some dyspnea with exertion.  Denies fevers chills night sweats weight loss or hemoptysis.  Patient's cough has resolved with initiation of Anoro.   08/19/2020- Interim hx  Patient presents today for 28-month follow-up.  Patient is a former smoker, transition to vaping in 2013.  Cough improved with addition of Anoro.CT chest showed evidence of emphysema.  Pulmonary function testing showed no significant obstruction/ NORMAL RATI0, FEV1 1 3.2 L (100%) predicted, TLC 90%, DLCO 91%.  Lung cancer screening program in February 2021 showed lung RADS 2, he is overdue for follow-up.   Patient is doing well today, no acute complaints. He states that the March 2021 inhaler is working well. He rarely required his Albuterol rescue inhaler. He has an occasional cough. Cough is mix or dry and productive. He is not as active as he would like, states that he has been "lazy" since covid. He is wanting to move more. He has looked into joining  [employer] or silver sneakers. He had felt low in the past and thought about asking for help, he is feeling better now and states that he will reach out in the future if he needs to. He is still vaping. He would like to try nicotine patches. He got his second covid booster in June 2022. Denies shortness of breath, wheezing or chest tightness. CAT 14    Allergies  Allergen Reactions   Lipitor [Atorvastatin]     myalgia   Pravastatin Other (See Comments)    myalgia    Immunization History  Administered Date(s) Administered   Influenza,inj,Quad PF,6+ Mos 10/25/2019   Moderna Sars-Covid-2 Vaccination 06/14/2019, 07/19/2019   PFIZER(Purple Top)SARS-COV-2 Vaccination 07/25/2020   Tdap 08/27/2014   Zoster Recombinat (Shingrix) 10/25/2019    Past Medical History:  Diagnosis Date   Aortic regurgitation    Atherosclerosis of native arteries of the extremities with intermittent claudication 09/08/2013   GERD (gastroesophageal reflux disease)    Heart murmur    Hyperlipidemia    Hypertension    Peripheral vascular disease with claudication    ABI .49     Substance abuse (HCC)     Tobacco History: Social History   Tobacco Use  Smoking Status Former   Packs/day: 1.00   Years: 30.00   Pack years: 30.00   Types: Cigarettes   Quit date: 12/21/2011   Years since quitting: 8.6  Smokeless Tobacco Never   Counseling given: Not Answered   Outpatient Medications Prior to Visit  Medication  Sig Dispense Refill   albuterol (VENTOLIN HFA) 108 (90 Base) MCG/ACT inhaler Inhale 2 puffs into the lungs every 6 (six) hours as needed for wheezing or shortness of breath. 8 g 6   aspirin 81 MG tablet Take 81 mg by mouth daily.     Cholecalciferol (VITAMIN D3) 1000 UNITS CAPS Take 1 capsule by mouth daily.     Coenzyme Q10 (CO Q 10 PO) Take 1 tablet by mouth daily.     Cyanocobalamin (VITAMIN B-12 PO) 1 tablet daily.     fluticasone (FLONASE) 50 MCG/ACT nasal spray Place 1-2 sprays into both nostrils  daily. 16 g 6   KRILL OIL PO Take 1 tablet by mouth daily.     lisinopril-hydrochlorothiazide (ZESTORETIC) 20-12.5 MG tablet Take 2 tablets by mouth daily. 180 tablet 3   MAGNESIUM PO Take 1 tablet by mouth daily.     Multiple Vitamins-Minerals (CENTRUM SILVER PO) Take 1 tablet by mouth daily.     Multiple Vitamins-Minerals (ZINC PO) Take 1 tablet by mouth daily.     omeprazole (PRILOSEC) 20 MG capsule Take 1 capsule (20 mg total) by mouth daily. 90 capsule 1   rosuvastatin (CRESTOR) 5 MG tablet Take 1 tablet (5 mg total) by mouth at bedtime. 90 tablet 1   TURMERIC PO Take 1 tablet by mouth daily.      ANORO ELLIPTA 62.5-25 MCG/INH AEPB 1 puff daily.     nicotine (NICOTROL) 10 MG inhaler Inhale 1 Cartridge (1 continuous puffing total) into the lungs as needed for smoking cessation. 168 each 0   No facility-administered medications prior to visit.    Review of Systems  Review of Systems  Constitutional: Negative.   HENT: Negative.    Respiratory:  Positive for cough. Negative for shortness of breath and wheezing.        Deconditioned   Cardiovascular: Negative.   Psychiatric/Behavioral:  Negative for agitation, dysphoric mood and suicidal ideas. The patient is not nervous/anxious.     Physical Exam  BP 110/68 (BP Location: Left Arm, Patient Position: Sitting, Cuff Size: Normal)   Pulse (!) 52   Temp 98 F (36.7 C) (Oral)   Ht 5\' 9"  (1.753 m)   Wt 163 lb (73.9 kg)   SpO2 99%   BMI 24.07 kg/m  Physical Exam Constitutional:      Appearance: Normal appearance.  HENT:     Head: Normocephalic and atraumatic.     Mouth/Throat:     Mouth: Mucous membranes are moist.     Pharynx: Oropharynx is clear.  Cardiovascular:     Rate and Rhythm: Normal rate and regular rhythm.  Pulmonary:     Effort: Pulmonary effort is normal.     Breath sounds: Normal breath sounds. No wheezing or rales.  Skin:    General: Skin is warm and dry.  Neurological:     General: No focal deficit  present.     Mental Status: He is alert and oriented to person, place, and time. Mental status is at baseline.  Psychiatric:        Mood and Affect: Mood normal.        Behavior: Behavior normal.        Thought Content: Thought content normal.        Judgment: Judgment normal.     Lab Results:  CBC    Component Value Date/Time   WBC 7.0 08/27/2014 1511   RBC 4.16 (L) 08/27/2014 1511   HGB 12.1 (L) 08/27/2014 1511  HCT 36.9 (L) 08/27/2014 1511   PLT 239 08/27/2014 1511   MCV 88.7 08/27/2014 1511   MCH 29.1 08/27/2014 1511   MCHC 32.8 08/27/2014 1511   RDW 14.0 08/27/2014 1511   LYMPHSABS 2.1 12/21/2011 0656   MONOABS 0.6 12/21/2011 0656   EOSABS 0.1 12/21/2011 0656   BASOSABS 0.0 12/21/2011 0656    BMET    Component Value Date/Time   NA 139 04/29/2020 1420   K 5.3 (H) 04/29/2020 1420   CL 101 04/29/2020 1420   CO2 25 04/29/2020 1420   GLUCOSE 98 04/29/2020 1420   GLUCOSE 98 08/27/2014 1511   BUN 15 04/29/2020 1420   CREATININE 1.17 04/29/2020 1420   CREATININE 1.07 08/27/2014 1511   CALCIUM 9.5 04/29/2020 1420   GFRNONAA 65 10/25/2019 1456   GFRNONAA 76 08/27/2014 1511   GFRAA 75 10/25/2019 1456   GFRAA 87 08/27/2014 1511    BNP No results found for: BNP  ProBNP    Component Value Date/Time   PROBNP 56 03/03/2019 1427    Imaging: No results found.   Assessment & Plan:   Panlobular emphysema (HCC) - Stable interval; Former cigarette smoker, currently vapping tobacco. He has an occasional cough which improved with addition of LAMA/LABA. CAT score 14. Continue Anoro Ellipta one puff daily. Recommend he start physical exercise regimen. Strongly encourage smoking cessation, we have sent in RX for nicotine patches, 14mg /day for 6 weeks then 7mg /day x 2 weeks. Received 2nd covid booster in June 2022.   6 month follow-up or sooner if needed  , NP 08/19/2020

## 2020-08-19 NOTE — Patient Instructions (Addendum)
Recommendations: - Continue Anoro one puff daily in morning; use Albuterol rescue inhaler 2 puffs every 6 hours as needed only for breakthrough symptoms  - Aim to get 20 min exercise 2-3 times a week (start slow and create a routine) - Continue to work on smoking cessation (use nicotine patches) - Due for low dose lung cancer screening (I have sent a message to our coordinator) - Call our office if you develop purulent mucus, increased shortness of breath, chest tightness or wheezing  Rx: - Nicotine patch 14mg /24 hr  Follow-up: - 6 months with Dr. 07-29-1983 or sooner if needed       Steps to Quit Smoking Smoking tobacco is the leading cause of preventable death. It can affect almost every organ in the body. Smoking puts you and people around you at risk for many serious, long-lasting (chronic) diseases. Quitting smoking can be hard, but it is one of the best things thatyou can do for your health. It is never too late to quit. How do I get ready to quit? When you decide to quit smoking, make a plan to help you succeed. Before you quit: Pick a date to quit. Set a date within the next 2 weeks to give you time to prepare. Write down the reasons why you are quitting. Keep this list in places where you will see it often. Tell your family, friends, and co-workers that you are quitting. Their support is important. Talk with your doctor about the choices that may help you quit. Find out if your health insurance will pay for these treatments. Know the people, places, things, and activities that make you want to smoke (triggers). Avoid them. What first steps can I take to quit smoking? Throw away all cigarettes at home, at work, and in your car. Throw away the things that you use when you smoke, such as ashtrays and lighters. Clean your car. Make sure to empty the ashtray. Clean your home, including curtains and carpets. What can I do to help me quit smoking? Talk with your doctor about taking  medicines and seeing a counselor at the same time. You are more likely to succeed when you do both. If you are pregnant or breastfeeding, talk with your doctor about counseling or other ways to quit smoking. Do not take medicine to help you quit smoking unless your doctor tells you to do so. To quit smoking: Quit right away Quit smoking totally, instead of slowly cutting back on how much you smoke over a period of time. Go to counseling. You are more likely to quit if you go to counseling sessions regularly. Take medicine You may take medicines to help you quit. Some medicines need a prescription, and some you can buy over-the-counter. Some medicines may contain a drug called nicotine to replace the nicotine in cigarettes. Medicines may: Help you to stop having the desire to smoke (cravings). Help to stop the problems that come when you stop smoking (withdrawal symptoms). Your doctor may ask you to use: Nicotine patches, gum, or lozenges. Nicotine inhalers or sprays. Non-nicotine medicine that is taken by mouth. Find resources Find resources and other ways to help you quit smoking and remain smoke-free after you quit. These resources are most helpful when you use them often. They include: Online chats with a Tonia Brooms. Phone quitlines. Printed Veterinary surgeon. Support groups or group counseling. Text messaging programs. Mobile phone apps. Use apps on your mobile phone or tablet that can help you stick to your quit plan.  There are many free apps for mobile phones and tablets as well as websites. Examples include Quit Guide from the [employer] and smokefree.gov  What things can I do to make it easier to quit?  Talk to your family and friends. Ask them to support and encourage you. Call a phone quitline (1-800-QUIT-NOW), reach out to support groups, or work with a Veterinary surgeon. Ask people who smoke to not smoke around you. Avoid places that make you want to smoke, such  as: Bars. Parties. Smoke-break areas at work. Spend time with people who do not smoke. Lower the stress in your life. Stress can make you want to smoke. Try these things to help your stress: Getting regular exercise. Doing deep-breathing exercises. Doing yoga. Meditating. Doing a body scan. To do this, close your eyes, focus on one area of your body at a time from head to toe. Notice which parts of your body are tense. Try to relax the muscles in those areas. How will I feel when I quit smoking? Day 1 to 3 weeks Within the first 24 hours, you may start to have some problems that come from quitting tobacco. These problems are very bad 2-3 days after you quit, but they do not often last for more than 2-3 weeks. You may get these symptoms: Mood swings. Feeling restless, nervous, angry, or annoyed. Trouble concentrating. Dizziness. Strong desire for high-sugar foods and nicotine. Weight gain. Trouble pooping (constipation). Feeling like you may vomit (nausea). Coughing or a sore throat. Changes in how the medicines that you take for other issues work in your body. Depression. Trouble sleeping (insomnia). Week 3 and afterward After the first 2-3 weeks of quitting, you may start to notice more positive results, such as: Better sense of smell and taste. Less coughing and sore throat. Slower heart rate. Lower blood pressure. Clearer skin. Better breathing. Fewer sick days. Quitting smoking can be hard. Do not give up if you fail the first time. Some people need to try a few times before they succeed. Do your best to stick to your quit plan, and talk with yourdoctor if you have any questions or concerns. Summary Smoking tobacco is the leading cause of preventable death. Quitting smoking can be hard, but it is one of the best things that you can do for your health. When you decide to quit smoking, make a plan to help you succeed. Quit smoking right away, not slowly over a period of  time. When you start quitting, seek help from your doctor, family, or friends. This information is not intended to replace advice given to you by your health care provider. Make sure you discuss any questions you have with your healthcare provider. Document Revised: 10/21/2018 Document Reviewed: 04/16/2018 Elsevier Patient Education  2022 [employer].

## 2020-08-19 NOTE — Telephone Encounter (Signed)
I called [name] to see if Albert Hardy would like to get his [facility] CT scheduled the same day as her [facility] CT was scheduled on 09/23/20.  She stated to schedule his CT at that time and if he needed to change the appt she would call me back

## 2020-08-21 NOTE — Progress Notes (Signed)
PCCM: thanks for seeing him Josephine Igo, DO  Pulmonary Critical Care 08/21/2020 4:34 PM

## 2020-09-23 ENCOUNTER — Other Ambulatory Visit: Payer: Self-pay

## 2020-09-23 ENCOUNTER — Ambulatory Visit
Admission: RE | Admit: 2020-09-23 | Discharge: 2020-09-23 | Disposition: A | Discharge status: Home / Self Care | Payer: Medicare Other | Source: Ambulatory Visit | Attending: Acute Care | Admitting: Acute Care

## 2020-09-23 DIAGNOSIS — Z87891 Personal history of nicotine dependence: Secondary | ICD-10-CM

## 2020-09-27 NOTE — Progress Notes (Signed)
Please call patient and let them  know their  low dose Ct was read as a Lung RADS 2: nodules that are benign in appearance and behavior with a very low likelihood of becoming a clinically active cancer due to size or lack of growth. Recommendation per radiology is for a repeat LDCT in 12 months. .Please let them  know we will order and schedule their  annual screening scan for 09/2021. Please let them  know there was notation of CAD on their  scan.  Please remind the patient  that this is a non-gated exam therefore degree or severity of disease  cannot be determined. Please have them  follow up with their PCP regarding potential risk factor modification, dietary therapy or pharmacologic therapy if clinically indicated. Pt.  is  currently on statin therapy. Please place order for annual  screening scan for  09/2021 and fax results to PCP. Thanks so much.  +  Aortic atherosclerosis, on statin, seen by cards already

## 2020-09-30 ENCOUNTER — Encounter: Payer: Self-pay | Admitting: *Deleted

## 2020-09-30 DIAGNOSIS — Z87891 Personal history of nicotine dependence: Secondary | ICD-10-CM

## 2020-10-14 ENCOUNTER — Other Ambulatory Visit: Payer: Self-pay | Admitting: Family Medicine

## 2020-10-14 DIAGNOSIS — K219 Gastro-esophageal reflux disease without esophagitis: Secondary | ICD-10-CM

## 2020-12-27 ENCOUNTER — Other Ambulatory Visit: Payer: Self-pay | Admitting: Primary Care

## 2020-12-27 MED ORDER — ALBUTEROL SULFATE HFA 108 (90 BASE) MCG/ACT IN AERS: 2.0000 | INHALATION_SPRAY | Freq: Four times a day (QID) | RESPIRATORY_TRACT | 6 refills | Status: DC | PRN

## 2021-01-12 ENCOUNTER — Other Ambulatory Visit: Payer: Self-pay | Admitting: Family Medicine

## 2021-01-12 DIAGNOSIS — K219 Gastro-esophageal reflux disease without esophagitis: Secondary | ICD-10-CM

## 2021-01-12 DIAGNOSIS — E785 Hyperlipidemia, unspecified: Secondary | ICD-10-CM

## 2021-01-30 ENCOUNTER — Other Ambulatory Visit: Payer: Self-pay | Admitting: *Deleted

## 2021-01-30 MED ORDER — ANORO ELLIPTA 62.5-25 MCG/ACT IN AEPB: 1.0000 | INHALATION_SPRAY | Freq: Every day | RESPIRATORY_TRACT | 3 refills | Status: DC

## 2021-02-22 ENCOUNTER — Other Ambulatory Visit: Payer: Self-pay | Admitting: Family Medicine

## 2021-02-22 DIAGNOSIS — E785 Hyperlipidemia, unspecified: Secondary | ICD-10-CM

## 2021-04-12 ENCOUNTER — Other Ambulatory Visit: Payer: Self-pay | Admitting: Family Medicine

## 2021-04-12 DIAGNOSIS — K219 Gastro-esophageal reflux disease without esophagitis: Secondary | ICD-10-CM

## 2021-04-12 DIAGNOSIS — E785 Hyperlipidemia, unspecified: Secondary | ICD-10-CM

## 2021-05-22 ENCOUNTER — Other Ambulatory Visit: Payer: Self-pay | Admitting: Family Medicine

## 2021-05-22 DIAGNOSIS — E785 Hyperlipidemia, unspecified: Secondary | ICD-10-CM

## 2021-05-22 DIAGNOSIS — K219 Gastro-esophageal reflux disease without esophagitis: Secondary | ICD-10-CM

## 2021-05-27 ENCOUNTER — Other Ambulatory Visit: Payer: Self-pay

## 2021-05-27 DIAGNOSIS — K219 Gastro-esophageal reflux disease without esophagitis: Secondary | ICD-10-CM

## 2021-05-27 DIAGNOSIS — E785 Hyperlipidemia, unspecified: Secondary | ICD-10-CM

## 2021-05-27 MED ORDER — LISINOPRIL-HYDROCHLOROTHIAZIDE 20-12.5 MG PO TABS: 2.0000 | ORAL_TABLET | Freq: Every day | ORAL | 0 refills | Status: DC

## 2021-05-27 MED ORDER — ROSUVASTATIN CALCIUM 5 MG PO TABS: 5.0000 mg | ORAL_TABLET | Freq: Every day | ORAL | 0 refills | Status: DC

## 2021-05-27 MED ORDER — OMEPRAZOLE 20 MG PO CPDR: 20.0000 mg | DELAYED_RELEASE_CAPSULE | Freq: Every day | ORAL | 0 refills | Status: DC

## 2021-06-12 ENCOUNTER — Ambulatory Visit (INDEPENDENT_AMBULATORY_CARE_PROVIDER_SITE_OTHER): Payer: Medicare Other | Admitting: Family Medicine

## 2021-06-12 VITALS — BP 130/78 | HR 82 | Temp 98.2°F | Resp 16 | Ht 69.0 in | Wt 168.2 lb

## 2021-06-12 DIAGNOSIS — K219 Gastro-esophageal reflux disease without esophagitis: Secondary | ICD-10-CM | POA: Diagnosis not present

## 2021-06-12 DIAGNOSIS — R0789 Other chest pain: Secondary | ICD-10-CM | POA: Diagnosis not present

## 2021-06-12 DIAGNOSIS — R7303 Prediabetes: Secondary | ICD-10-CM

## 2021-06-12 DIAGNOSIS — Z23 Encounter for immunization: Secondary | ICD-10-CM | POA: Diagnosis not present

## 2021-06-12 DIAGNOSIS — E785 Hyperlipidemia, unspecified: Secondary | ICD-10-CM

## 2021-06-12 DIAGNOSIS — I10 Essential (primary) hypertension: Secondary | ICD-10-CM

## 2021-06-12 DIAGNOSIS — R002 Palpitations: Secondary | ICD-10-CM

## 2021-06-12 DIAGNOSIS — Z720 Tobacco use: Secondary | ICD-10-CM

## 2021-06-12 LAB — LIPID PANEL
Cholesterol: 216 mg/dL — ABNORMAL HIGH (ref 0–200)
HDL: 75.4 mg/dL (ref >39.00)
LDL Cholesterol: 126 mg/dL — ABNORMAL HIGH (ref 0–99)
NonHDL: 140.64
Total CHOL/HDL Ratio: 3
Triglycerides: 72 mg/dL (ref 0.0–149.0)
VLDL: 14.4 mg/dL (ref 0.0–40.0)

## 2021-06-12 LAB — COMPREHENSIVE METABOLIC PANEL
ALT: 19 U/L (ref 0–53)
AST: 23 U/L (ref 0–37)
Albumin: 4.6 g/dL (ref 3.5–5.2)
Alkaline Phosphatase: 62 U/L (ref 39–117)
BUN: 15 mg/dL (ref 6–23)
CO2: 31 mEq/L (ref 19–32)
Calcium: 9.8 mg/dL (ref 8.4–10.5)
Chloride: 102 mEq/L (ref 96–112)
Creatinine, Ser: 1.19 mg/dL (ref 0.40–1.50)
GFR: 63.89 mL/min (ref >60.00)
Glucose, Bld: 93 mg/dL (ref 70–99)
Potassium: 4.1 mEq/L (ref 3.5–5.1)
Sodium: 141 mEq/L (ref 135–145)
Total Bilirubin: 0.4 mg/dL (ref 0.2–1.2)
Total Protein: 7.6 g/dL (ref 6.0–8.3)

## 2021-06-12 MED ORDER — ROSUVASTATIN CALCIUM 5 MG PO TABS: 5.0000 mg | ORAL_TABLET | Freq: Every day | ORAL | 1 refills | Status: DC

## 2021-06-12 MED ORDER — OMEPRAZOLE 20 MG PO CPDR: 20.0000 mg | DELAYED_RELEASE_CAPSULE | Freq: Every day | ORAL | 1 refills | Status: DC

## 2021-06-12 MED ORDER — LISINOPRIL-HYDROCHLOROTHIAZIDE 20-12.5 MG PO TABS: 2.0000 | ORAL_TABLET | Freq: Every day | ORAL | 1 refills | Status: DC

## 2021-06-12 NOTE — Progress Notes (Signed)
? ?Subjective:  ?Patient ID: Albert Dubin., male    DOB: 03/08/1955  Age: 66 y.o. MRN: [id number] ? ?CC:  ?Chief Complaint  ?Patient presents with  ? Hyperlipidemia  ?  Pt her for labs and refills  ? Hypertension  ?  Due for refill and recheck, notes occasional light headedness   ? Gastroesophageal Reflux  ?  Pt here for refill and recheck   ? ? ?HPI ?Albert Hardy. presents for  ? ?Emphysema ?Followed by pulmonary, last visit July 2022.  Continued on Anoro 1 puff in the morning, albuterol rescue inhaler as needed.  Vaping cessation was discussed with nicotine patches, referred for low-dose lung cancer screening.  Exercise discussed.  56-month follow-up recommended. Still vaping. Never used patches.  ?Current meds working well. Just started walking - breathing ok.  ? ?GERD: ?Treated with Prilosec, Pepcid previously.  Intermittent dosing discussed as an option if well controlled at his last visit with me in March of last year. ?Off pepcid for awhile. Heartburn controlled overall with just prilosec. Some foods flare, late night snacks at times.  ? ?Hyperlipidemia: ?Crestor 5 mg every day. No new myalgias/side effects.  ?Lab Results  ?Component Value Date  ? CHOL 232 (H) 04/29/2020  ? HDL 77 04/29/2020  ? LDLCALC 150 (H) 04/29/2020  ? TRIG 32 04/29/2020  ? CHOLHDL 3.0 04/29/2020  ? ?Lab Results  ?Component Value Date  ? ALT 16 04/29/2020  ? AST 23 04/29/2020  ? ALKPHOS 65 04/29/2020  ? BILITOT 0.3 04/29/2020  ? ?Prediabetes: ?Most recent testing in March of last year.  Diet/exercise approach discussed.  Weight up 5 pounds from July of last year. Recently started walking more after an interval of inactivity.  ?Drinking sweet tea - gallon in 3-4 days.  ?Lab Results  ?Component Value Date  ? HGBA1C 5.7 (H) 04/29/2020  ? ?Wt Readings from Last 3 Encounters:  ?06/12/21 168 lb 3.2 oz (76.3 kg)  ?08/19/20 163 lb (73.9 kg)  ?04/24/20 162 lb (73.5 kg)  ? ?Hypertension: ?Last visit was a video visit in March 2022. ?Cardiology  Dr. Dulce Sellar prior - last ov in 04/2019. History of aortic valve insufficiency, bicuspid aortic valve with stenosis and moderate regurgitation on 2020 echo.  Plan for echo repeat in 1 year when seen in 2021.  Hypertension treated with lisinopril, HCTZ.  ?Home readings: none recently, but stable when checking - 120/68. No new side effects with meds.  ?Past few months some ache on left side of chest, fluttering feeling at times. Some associated lightheadedness with fluttering - felt disoriented at episode 2 nights ago. Palpitations more frequent recently - every few days.  ?Chest pressure, radiates to left arm at times. Some shortness of breath with chest pressure. Lasts 10 minutes. Anxious when happening. Noticed 2 nights ago. Noticed at rest. No CP with activity, bowling and walking  ? ? ?BP Readings from Last 3 Encounters:  ?06/12/21 130/78  ?08/19/20 110/68  ?02/19/20 110/60  ? ?Lab Results  ?Component Value Date  ? CREATININE 1.17 04/29/2020  ? ?Immunization History  ?Administered Date(s) Administered  ? Influenza,inj,Quad PF,6+ Mos 10/25/2019  ? Moderna Sars-Covid-2 Vaccination 06/14/2019, 07/19/2019  ? PFIZER(Purple Top)SARS-COV-2 Vaccination 04/09/2020, 07/25/2020  ? Tdap 08/27/2014  ? Zoster Recombinat (Shingrix) 10/25/2019  ? ? ? ?History ?Patient Active Problem List  ? Diagnosis Date Noted  ? 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  ? Bilateral sensorineural hearing loss 12/14/2018  ? Bilateral impacted cerumen 10/13/2018  ? Subjective tinnitus of both ears  10/13/2018  ? Aortic stenosis 04/04/2018  ? Atherosclerosis of native arteries of the extremities with intermittent claudication 09/08/2013  ? Former moderate cigarette smoker (10-19 per day) 05/20/2012  ? Peripheral vascular disease with claudication   ? Hyperlipidemia   ? Aortic regurgitation   ? GERD (gastroesophageal reflux disease)   ? Hypertension   ? ?Past Medical History:  ?Diagnosis Date  ? Aortic regurgitation   ? Atherosclerosis of native arteries  of the extremities with intermittent claudication 09/08/2013  ? GERD (gastroesophageal reflux disease)   ? Heart murmur   ? Hyperlipidemia   ? Hypertension   ? Peripheral vascular disease with claudication   ? ABI .49    ? Substance abuse (HCC)   ? ?Past Surgical History:  ?Procedure Laterality Date  ? NO PAST SURGERIES    ? ?Allergies  ?Allergen Reactions  ? Lipitor [Atorvastatin]   ?  myalgia  ? Pravastatin Other (See Comments)  ?  myalgia  ? ?Prior to Admission medications   ?Medication Sig Start Date End Date Taking? Authorizing Provider  ?albuterol (VENTOLIN HFA) 108 (90 Base) MCG/ACT inhaler Inhale 2 puffs into the lungs every 6 (six) hours as needed for wheezing or shortness of breath. 12/27/20  Yes Glenford BayleyWalsh, Elizabeth W, NP  ?aspirin 81 MG tablet Take 81 mg by mouth daily.   Yes [provider]  ?Cholecalciferol (VITAMIN D3) 1000 UNITS CAPS Take 1 capsule by mouth daily.   Yes [provider]  ?Coenzyme Q10 (CO Q 10 PO) Take 1 tablet by mouth daily.   Yes [provider]  ?Cyanocobalamin (VITAMIN B-12 PO) 1 tablet daily.    [provider]  ?fluticasone (FLONASE) 50 MCG/ACT nasal spray Place 1-2 sprays into both nostrils daily. 04/24/20   Shade FloodGreene, Omari Mcmanaway R, MD  ?KRILL OIL PO Take 1 tablet by mouth daily.    [provider]  ?lisinopril-hydrochlorothiazide (ZESTORETIC) 20-12.5 MG tablet Take 2 tablets by mouth daily. 05/27/21   Shade FloodGreene, Drue Camera R, MD  ?MAGNESIUM PO Take 1 tablet by mouth daily.    [provider]  ?Multiple Vitamins-Minerals (CENTRUM SILVER PO) Take 1 tablet by mouth daily.    [provider]  ?Multiple Vitamins-Minerals (ZINC PO) Take 1 tablet by mouth daily.    [provider]  ?nicotine (NICODERM CQ - DOSED IN MG/24 HOURS) 14 mg/24hr patch 14mg /day for 6 weeks then 7mg /day x 2 weeks 08/19/20   Glenford BayleyWalsh, Elizabeth W, NP  ?omeprazole (PRILOSEC) 20 MG capsule Take 1 capsule (20 mg total) by mouth daily. 05/27/21   Shade FloodGreene, Rylyn Zawistowski R,  MD  ?rosuvastatin (CRESTOR) 5 MG tablet Take 1 tablet (5 mg total) by mouth at bedtime. 05/27/21   Shade FloodGreene, Alina Gilkey R, MD  ?TURMERIC PO Take 1 tablet by mouth daily.     [provider]  ?umeclidinium-vilanterol (ANORO ELLIPTA) 62.5-25 MCG/ACT AEPB Inhale 1 puff into the lungs daily. 01/30/21   Josephine IgoIcard, Bradley L, DO  ? ?Social History  ? ?Socioeconomic History  ? Marital status: Married  ?  Spouse name: Not on file  ? Number of children: Not on file  ? Years of education: Not on file  ? Highest education level: Not on file  ?Occupational History  ? Occupation: Recruitment consultantAutomobile Detailer  ?Tobacco Use  ? Smoking status: Former  ?  Packs/day: 1.00  ?  Years: 30.00  ?  Pack years: 30.00  ?  Types: Cigarettes  ?  Quit date: 12/21/2011  ?  Years since quitting: 9.4  ? Smokeless  tobacco: Never  ?Vaping Use  ? Vaping Use: Every day  ?Substance and Sexual Activity  ? Alcohol use: Yes  ?  Comment: Ocassionaly.   ? Drug use: Not Currently  ?  Frequency: 1.0 times per week  ?  Types: Marijuana  ? Sexual activity: Yes  ?  Birth control/protection: None  ?Other Topics Concern  ? Not on file  ?Social History Narrative  ? Married. Education: [employer]. Exercise:  "Somewhat".  ? ?Social Determinants of Health  ? ?Financial Resource Strain: Not on file  ?Food Insecurity: Not on file  ?Transportation Needs: Not on file  ?Physical Activity: Not on file  ?Stress: Not on file  ?Social Connections: Not on file  ?Intimate Partner Violence: Not on file  ? ? ?Review of Systems ? ?Per HPI.  ?Objective:  ? ?Vitals:  ? 06/12/21 1147  ?BP: 130/78  ?Pulse: 82  ?Resp: 16  ?Temp: 98.2 ?F (36.8 ?C)  ?TempSrc: Temporal  ?SpO2: 99%  ?Weight: 168 lb 3.2 oz (76.3 kg)  ?Height: 5\' 9"  (1.753 m)  ? ? ? ?Physical Exam ?Vitals reviewed.  ?Constitutional:   ?   Appearance: He is well-developed.  ?HENT:  ?   Head: Normocephalic and atraumatic.  ?Neck:  ?   Vascular: No carotid bruit or JVD.  ?Cardiovascular:  ?   Rate and Rhythm: Normal rate and regular rhythm.   ?   Heart sounds: Normal heart sounds. No murmur heard. ?Pulmonary:  ?   Effort: Pulmonary effort is normal.  ?   Breath sounds: Normal breath sounds. No rales.  ?Musculoskeletal:  ?   Right lower leg

## 2021-06-12 NOTE — Patient Instructions (Addendum)
Great seeing you again today.   ?It appears you are due for follow-up with pulmonary.  Please call their office to schedule appointment. I would try the patches to see if those help with quitting vaping.  ?Cut back on sweet tea, water is best.   ?I will discuss your EKG and refer you to cardiology for the chest symptoms, but if those return should be seen in the emergency room or call 911.  No new medications for now.  Make sure to stay well-hydrated, rest, avoid exertional activities for now until evaluated by cardiology. ? ?Food Choices for Gastroesophageal Reflux Disease, Adult ?When you have gastroesophageal reflux disease (GERD), the foods you eat and your eating habits are very important. Choosing the right foods can help ease the discomfort of GERD. Consider working with a dietitian to help you make healthy food choices. ?What are tips for following this plan? ?Reading food labels ?Look for foods that are low in saturated fat. Foods that have less than 5% of daily value (DV) of fat and 0 g of trans fats may help with your symptoms. ?Cooking ?Cook foods using methods other than frying. This may include baking, steaming, grilling, or broiling. These are all methods that do not need a lot of fat for cooking. ?To add flavor, try to use herbs that are low in spice and acidity. ?Meal planning ? ?Choose healthy foods that are low in fat, such as fruits, vegetables, whole grains, low-fat dairy products, lean meats, fish, and poultry. ?Eat frequent, small meals instead of three large meals each day. Eat your meals slowly, in a relaxed setting. Avoid bending over or lying down until 2-3 hours after eating. ?Limit high-fat foods such as fatty meats or fried foods. ?Limit your intake of fatty foods, such as oils, butter, and shortening. ?Avoid the following as told by your health care provider: ?Foods that cause symptoms. These may be different for different people. Keep a food diary to keep track of foods that cause  symptoms. ?Alcohol. ?Drinking large amounts of liquid with meals. ?Eating meals during the 2-3 hours before bed. ?Lifestyle ?Maintain a healthy weight. Ask your health care provider what weight is healthy for you. If you need to lose weight, work with your health care provider to do so safely. ?Exercise for at least 30 minutes on 5 or more days each week, or as told by your health care provider. ?Avoid wearing clothes that fit tightly around your waist and chest. ?Do not use any products that contain nicotine or tobacco. These products include cigarettes, chewing tobacco, and vaping devices, such as e-cigarettes. If you need help quitting, ask your health care provider. ?Sleep with the head of your bed raised. Use a wedge under the mattress or blocks under the bed frame to raise the head of the bed. ?Chew sugar-free gum after mealtimes. ?What foods should I eat? ? ?Eat a healthy, well-balanced diet of fruits, vegetables, whole grains, low-fat dairy products, lean meats, fish, and poultry. Each person is different. Foods that may trigger symptoms in one person may not trigger any symptoms in another person. Work with your health care provider to identify foods that are safe for you. ?The items listed above may not be a complete list of recommended foods and beverages. Contact a dietitian for more information. ?What foods should I avoid? ?Limiting some of these foods may help manage the symptoms of GERD. Everyone is different. Consult a dietitian or your health care provider to help you identify the  exact foods to avoid, if any. ?Fruits ?Any fruits prepared with added fat. Any fruits that cause symptoms. For some people this may include citrus fruits, such as oranges, grapefruit, pineapple, and lemons. ?Vegetables ?Deep-fried vegetables. [place] fries. Any vegetables prepared with added fat. Any vegetables that cause symptoms. For some people, this may include tomatoes and tomato products, chili peppers, onions and  garlic, and horseradish. ?Grains ?Pastries or quick breads with added fat. ?Meats and other proteins ?High-fat meats, such as fatty beef or pork, hot dogs, ribs, ham, sausage, salami, and bacon. Fried meat or protein, including fried fish and fried chicken. Nuts and nut butters, in large amounts. ?Dairy ?Whole milk and chocolate milk. Sour cream. Cream. Ice cream. Cream cheese. Milkshakes. ?Fats and oils ?Butter. Margarine. Shortening. Ghee. ?Beverages ?Coffee and tea, with or without caffeine. Carbonated beverages. Sodas. Energy drinks. Fruit juice made with acidic fruits, such as orange or grapefruit. Tomato juice. Alcoholic drinks. ?Sweets and desserts ?Chocolate and cocoa. Donuts. ?Seasonings and condiments ?Pepper. Peppermint and spearmint. Added salt. Any condiments, herbs, or seasonings that cause symptoms. For some people, this may include curry, hot sauce, or vinegar-based salad dressings. ?The items listed above may not be a complete list of foods and beverages to avoid. Contact a dietitian for more information. ?Questions to ask your health care provider ?Diet and lifestyle changes are usually the first steps that are taken to manage symptoms of GERD. If diet and lifestyle changes do not improve your symptoms, talk with your health care provider about taking medicines. ?Where to find more information ?International Foundation for Gastrointestinal Disorders: aboutgerd.org ?Summary ?When you have gastroesophageal reflux disease (GERD), food and lifestyle choices may be very helpful in easing the discomfort of GERD. ?Eat frequent, small meals instead of three large meals each day. Eat your meals slowly, in a relaxed setting. Avoid bending over or lying down until 2-3 hours after eating. ?Limit high-fat foods such as fatty meats or fried foods. ?This information is not intended to replace advice given to you by your health care provider. Make sure you discuss any questions you have with your health care  provider. ?Document Revised: 08/07/2019 Document Reviewed: 08/07/2019 ?Elsevier Patient Education ? 2023 Elsevier Inc. ? ?

## 2021-06-13 ENCOUNTER — Telehealth: Payer: Self-pay | Admitting: Family Medicine

## 2021-06-13 NOTE — Telephone Encounter (Signed)
Please add TSH, CBC to bloodwork from 06/12/21 OV. Dx: palpitations. Thanks.  ?

## 2021-06-13 NOTE — Telephone Encounter (Signed)
Message from lab. ? ?Lab's refrigerator is down and they can only accept add ons if the sample was drawn same day until further notice! ?

## 2021-06-20 ENCOUNTER — Telehealth: Payer: Self-pay | Admitting: Family Medicine

## 2021-06-20 NOTE — Telephone Encounter (Signed)
Pt called in stating that he can't get in to see Dr. Dulce Sellar until 09/04/21, please advise  ? ?He lm with [facility]  ?

## 2021-06-20 NOTE — Telephone Encounter (Signed)
LM

## 2021-06-24 ENCOUNTER — Telehealth: Payer: Self-pay | Admitting: Cardiology

## 2021-06-24 NOTE — Telephone Encounter (Signed)
-----   Message from Neena Rhymes, RN sent at 06/13/2021  4:40 PM EDT ----- ?Per Dr. Fidel Levy gentleman need to have a follow-up next week finally got some chest pain as well as palpitations also ventricular bigeminy on EKG noted by primary care physician who called me today already so schedule him to see me or any of next week  ? ?

## 2021-06-24 NOTE — Telephone Encounter (Signed)
Called patient to schedule earlier appt/LVM/kbl 06/24/21 ?

## 2021-08-28 ENCOUNTER — Other Ambulatory Visit: Payer: Self-pay

## 2021-08-28 DIAGNOSIS — F1910 Other psychoactive substance abuse, uncomplicated: Secondary | ICD-10-CM | POA: Insufficient documentation

## 2021-08-28 DIAGNOSIS — R011 Cardiac murmur, unspecified: Secondary | ICD-10-CM | POA: Insufficient documentation

## 2021-09-03 NOTE — Progress Notes (Unsigned)
Cardiology Office Note:    Date:  09/04/2021   ID:  Albert Council., DOB 22-Apr-1955, MRN [id number]  PCP:  Albert Flood, MD  Cardiologist:  Albert Herrlich, MD    Referring MD: Albert Flood, MD    ASSESSMENT:    1. Chest pain of uncertain etiology   2. Frequent PVCs   3. Nonrheumatic aortic valve stenosis   4. Nonrheumatic aortic valve insufficiency   5. Peripheral vascular disease with claudication   6. Pure hypercholesterolemia    PLAN:    In order of problems listed above:  Is a complex presentation for evaluation of chest pain I think he is best served by cardiac CTA he has no dye allergy and renal function is preserved.  We could also get a calcium score of his aortic valve.  If high risk markers would benefit from angiography and revascularization 1 week ZIO monitor to define burden and complexity of ventricular arrhythmia. I am concerned he has had progression in his aortic valve disease mostly stenosis I cannot auscultate aortic regurgitation echocardiogram be set up through my office Check ABI segmental pressures Add Zetia to achieve LDLs of less than 70 may require PCSK9 inhibitor also screen for LP(a) which can be associated with a bicuspid valve and more rapid progression of aortic stenosis  Next appointment: 3 months   Medication Adjustments/Labs and Tests Ordered: Current medicines are reviewed at length with the patient today.  Concerns regarding medicines are outlined above.  No orders of the defined types were placed in this encounter.  No orders of the defined types were placed in this encounter.   Chief complaint palpitations PVCs chest discomfort and claudication  History of Present Illness:    Albert Hardy. is a 66 y.o. male with a hx of aortic valve disease with moderate regurgitation and mild stenosis suspected bicuspid aortic valve last seen 04/18/2019.  There is a notation in her chart there was a phone call  for chest pain and PVCs.  An  EKG performed 06/12/2021 showed sinus bradycardia and frequent PVCs  Compliance with diet, lifestyle and medications: Yes  There is a complex visit mostly prompted by the presence of frequent PVCs on EKG from May 2023 He does not feel well and is limited predominantly by calf claudication bilaterally walking about 100 yards. He also has had episodes of palpitation at rest no syncope He has symptoms of reflux he has no exertional chest discomfort but sometimes at rest he gets brief chest discomfort rating to the left arm.  Its not been severe or sustained. He is not short of breath no edema. 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 He is compliant with his medications All of this resulted in his referral to the office today He has not had no fever or chills Past Medical History:  Diagnosis Date   Aortic regurgitation    Aortic stenosis 04/04/2018   Atherosclerosis of native arteries of the extremities with intermittent claudication 09/08/2013   Bilateral impacted cerumen 10/13/2018   Bilateral sensorineural hearing loss 12/14/2018   Former moderate cigarette smoker (10-19 per day) 05/20/2012   Quit smoking Nov 2013 when hospitalized w/ HTN and chest pain(diagnosed w/ valvular heart disease)   GERD (gastroesophageal reflux disease)    Heart murmur    Hyperlipidemia    Hypertension    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   Peripheral vascular disease with claudication    ABI .49  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   aspirin 81 MG tablet Take 81 mg by mouth daily.   Cholecalciferol (VITAMIN D3) 1000  UNITS CAPS Take 1 capsule by mouth daily.   Coenzyme Q10 (CO Q 10 PO) Take 1 tablet by mouth daily.   Cyanocobalamin (VITAMIN B-12 PO) Take 1 tablet by mouth daily.   fluticasone (FLONASE) 50 MCG/ACT nasal spray Place 1-2 sprays into both nostrils daily.   KRILL OIL PO Take 1 tablet by mouth daily.   lisinopril-hydrochlorothiazide (ZESTORETIC) 20-12.5 MG tablet Take 2 tablets by mouth daily.   MAGNESIUM PO Take 1 tablet by mouth daily.   Multiple Vitamins-Minerals (CENTRUM SILVER PO) Take 1 tablet by mouth daily.   Multiple Vitamins-Minerals (ZINC PO) Take 1 tablet by mouth daily.   nicotine (NICODERM CQ - DOSED IN MG/24 HOURS) 14 mg/24hr patch 14mg /day for 6 weeks then 7mg /day x 2 weeks   omeprazole (PRILOSEC) 20 MG capsule Take 1 capsule (20 mg total) by mouth daily.   rosuvastatin (CRESTOR) 5 MG tablet Take 1 tablet (5 mg total) by mouth at bedtime.   TURMERIC PO Take 1 tablet by mouth daily.    umeclidinium-vilanterol (ANORO ELLIPTA) 62.5-25 MCG/ACT AEPB Inhale 1 puff into the lungs daily.     Allergies:   Lipitor [atorvastatin] and Pravastatin   Social History   Socioeconomic History   Marital status: Married    Spouse name: Not on file   Number of children: Not on file   Years of education: Not on file   Highest education level: Not on file  Occupational History   Occupation: Warden/ranger  Tobacco Use   Smoking status: Former    Packs/day: 1.00    Years: 30.00    Total pack years: 30.00    Types: Cigarettes    Quit date: 12/21/2011    Years since quitting: 9.7   Smokeless tobacco: Never  Vaping Use   Vaping Use: Every day  Substance and Sexual Activity   Alcohol use: Yes    Comment: Ocassionaly.    Drug use: Not Currently    Frequency: 1.0 times per week    Types: Marijuana   Sexual activity: Yes    Birth control/protection: None  Other Topics Concern   Not on file  Social History Narrative   Married. Education: [employer]. Exercise:  "Somewhat".   Social  Determinants of Health   Financial Resource Strain: Not on file  Food Insecurity: Not on file  Transportation Needs: Not on file  Physical Activity: Not on file  Stress: Not on file  Social Connections: Not on file     Family History: The patient's family history includes Arthritis in his mother; Colon cancer in his mother; Diabetes in his sister; Heart attack in his father; Hyperlipidemia in his brother, brother, mother, and sister; Hypertension in his brother, mother, and sister; Other in his brother. ROS:   Please see the history of present illness.    All other systems reviewed and are negative.  EKGs/Labs/Other Studies Reviewed:    The following studies were reviewed today:  Recent Labs: 06/12/2021: ALT 19; BUN 15; Creatinine, Ser 1.19; Potassium  4.1; Sodium 141  Recent Lipid Panel    Component Value Date/Time   CHOL 216 (H) 06/12/2021 1423   CHOL 232 (H) 04/29/2020 1420   TRIG 72.0 06/12/2021 1423   HDL 75.40 06/12/2021 1423   HDL 77 04/29/2020 1420   CHOLHDL 3 06/12/2021 1423   VLDL 14.4 06/12/2021 1423   LDLCALC 126 (H) 06/12/2021 1423   LDLCALC 150 (H) 04/29/2020 1420    Physical Exam:    VS:  BP (!) 155/55   Pulse (!) 56   Ht 5\' 9"  (1.753 m)   Wt 170 lb (77.1 kg)   SpO2 99%   BMI 25.10 kg/m     Wt Readings from Last 3 Encounters:  09/04/21 170 lb (77.1 kg)  06/12/21 168 lb 3.2 oz (76.3 kg)  08/19/20 163 lb (73.9 kg)     GEN:  Well nourished, well developed in no acute distress HEENT: Normal NECK: No JVD; No carotid bruits LYMPHATICS: No lymphadenopathy CARDIAC: Harsh grade 2-3 almost holosystolic murmur I do not think S2 splits radiates to the left sternal border to the aortic area for carotid upstroke no AR RRR,  Pedal pulses could not be palpated femorals are diminished bilaterally no bruit RESPIRATORY:  Clear to auscultation without rales, wheezing or rhonchi  ABDOMEN: Soft, non-tender, non-distended MUSCULOSKELETAL:  No edema; No deformity   SKIN: Warm and dry NEUROLOGIC:  Alert and oriented x 3 PSYCHIATRIC:  Normal affect    Signed, 10/20/20, MD  09/04/2021 11:22 AM     [facility]

## 2021-09-04 ENCOUNTER — Ambulatory Visit (INDEPENDENT_AMBULATORY_CARE_PROVIDER_SITE_OTHER): Payer: Medicare Other

## 2021-09-04 ENCOUNTER — Ambulatory Visit: Payer: Medicare Other

## 2021-09-04 ENCOUNTER — Ambulatory Visit (INDEPENDENT_AMBULATORY_CARE_PROVIDER_SITE_OTHER): Payer: Medicare Other | Admitting: Cardiology

## 2021-09-04 ENCOUNTER — Encounter: Payer: Self-pay | Admitting: Cardiology

## 2021-09-04 VITALS — BP 155/55 | HR 56 | Ht 69.0 in | Wt 170.0 lb

## 2021-09-04 DIAGNOSIS — I739 Peripheral vascular disease, unspecified: Secondary | ICD-10-CM

## 2021-09-04 DIAGNOSIS — I350 Nonrheumatic aortic (valve) stenosis: Secondary | ICD-10-CM

## 2021-09-04 DIAGNOSIS — I351 Nonrheumatic aortic (valve) insufficiency: Secondary | ICD-10-CM

## 2021-09-04 DIAGNOSIS — E7800 Pure hypercholesterolemia, unspecified: Secondary | ICD-10-CM

## 2021-09-04 DIAGNOSIS — I493 Ventricular premature depolarization: Secondary | ICD-10-CM

## 2021-09-04 DIAGNOSIS — R079 Chest pain, unspecified: Secondary | ICD-10-CM | POA: Diagnosis not present

## 2021-09-04 MED ORDER — EZETIMIBE 10 MG PO TABS: 10.0000 mg | ORAL_TABLET | Freq: Every day | ORAL | 3 refills | Status: DC

## 2021-09-04 MED ORDER — METOPROLOL TARTRATE 25 MG PO TABS: 25.0000 mg | ORAL_TABLET | Freq: Once | ORAL | 0 refills | Status: DC

## 2021-09-04 NOTE — Patient Instructions (Signed)
Medication Instructions:  Your physician has recommended you make the following change in your medication:   START: Zetia 10 mg daily  *If you need a refill on your cardiac medications before your next appointment, please call your pharmacy*   Lab Work: Your physician recommends that you return for lab work in:   Labs in 1 month: Lipids, Lpa Labs today: BMP  If you have labs (blood work) drawn today and your tests are completely normal, you will receive your results only by: MyChart Message (if you have MyChart) OR A paper copy in the mail If you have any lab test that is abnormal or we need to change your treatment, we will call you to review the results.   Testing/Procedures: A zio monitor was ordered today. It will remain on for 7 days. You will then return monitor and event diary in provided box. It takes 1-2 weeks for report to be downloaded and returned to [place]. We will call you with the results. If monitor falls off or has orange flashing light, please call Zio for further instructions.   Your physician has requested that you have an echocardiogram. Echocardiography is a painless test that uses sound waves to create images of your heart. It provides your doctor with information about the size and shape of your heart and how well your heart's chambers and valves are working. This procedure takes approximately one hour. There are no restrictions for this procedure.  Your physician has requested that you have an ankle brachial index (ABI). During this test an ultrasound and blood pressure cuff are used to evaluate the arteries that supply the arms and legs with blood. Allow thirty minutes for this exam. There are no restrictions or special instructions.     Your cardiac CT will be scheduled at one of the below locations:   [facility] [address] [place], [place] [address] [phone]  OR  [facility] [address] [place], [place] [address] [phone]  If scheduled at [facility], please arrive at the [facility] Entrance (Entrance C2) of [facility] 30 minutes prior to test start time. You can use the FREE valet parking offered at entrance C (encouraged to control the heart rate for the test)  Proceed to the [facility] Radiology Department (first floor) to check-in and test prep.  All radiology patients and guests should use entrance C2 at [facility], accessed from [facility], even though the hospital's physical address listed is [address]    If scheduled at [facility], please arrive 15 mins early for check-in and test prep.  Please follow these instructions carefully (unless otherwise directed):  On the Night Before the Test: Be sure to Drink plenty of water. Do not consume any caffeinated/decaffeinated beverages or chocolate 12 hours prior to your test. Do not take any antihistamines 12 hours prior to your test.  On the Day of the Test: Drink plenty of water until 1 hour prior to the test. Do not eat any food 4 hours prior to the test. You may take your regular medications prior to the test.  Take metoprolol (Lopressor) two hours prior to test. HOLD Zestoretic the morning of the test.       After the Test: Drink plenty of water. After receiving IV contrast, you may experience a mild flushed feeling. This is normal. On occasion, you may experience a mild rash up  to 24 hours after the test. This is not dangerous. If this occurs, you can take Benadryl 25 mg and increase your fluid intake. If you experience trouble breathing, this can be serious. If it is severe call 911 IMMEDIATELY. If it is mild, please call our office. If you take any of these medications: Glipizide/Metformin, Avandament, Glucavance, please do not take 48 hours after completing test unless otherwise instructed.  We will  call to schedule your test 2-4 weeks out understanding that some insurance companies will need an authorization prior to the service being performed.   For non-scheduling related questions, please contact the cardiac imaging nurse navigator should you have any questions/concerns: Rockwell Alexandria, Cardiac Imaging Nurse Navigator Larey Brick, Cardiac Imaging Nurse Navigator [facility] Direct Office Dial: [phone]   For scheduling needs, including cancellations and rescheduling, please call [place], [phone].     Follow-Up: At [facility], you and your health needs are our priority.  As part of our continuing mission to provide you with exceptional heart care, we have created designated Provider Care Teams.  These Care Teams include your primary Cardiologist (physician) and Advanced Practice Providers (APPs -  Physician Assistants and Nurse Practitioners) who all work together to provide you with the care you need, when you need it.  We recommend signing up for the patient portal called "MyChart".  Sign up information is provided on this After Visit Summary.  MyChart is used to connect with patients for Virtual Visits (Telemedicine).  Patients are able to view lab/test results, encounter notes, upcoming appointments, etc.  Non-urgent messages can be sent to your provider as well.   To learn more about what you can do with MyChart, go to [email or web address].    Your next appointment:   6 week(s)  The format for your next appointment:   In Person  Provider:   Norman Herrlich, MD    Other Instructions None  Important Information About Sugar

## 2021-09-09 ENCOUNTER — Ambulatory Visit (INDEPENDENT_AMBULATORY_CARE_PROVIDER_SITE_OTHER): Payer: Medicare Other

## 2021-09-09 DIAGNOSIS — Z0000 Encounter for general adult medical examination without abnormal findings: Secondary | ICD-10-CM | POA: Diagnosis not present

## 2021-09-09 NOTE — Progress Notes (Signed)
Subjective:   Albert CouncilFloyd Stahle Jr. is a 66 y.o. male who presents for an Initial Medicare Annual Wellness Visit.   I connected with Albert CouncilFloyd Formby Jr. today by telephone and verified that I am speaking with the correct person using two identifiers. Location patient: home Location provider: work Persons participating in the virtual visit: patient, provider.   I discussed the limitations, risks, security and privacy concerns of performing an evaluation and management service by telephone and the availability of in person appointments. I also discussed with the patient that there may be a patient responsible charge related to this service. The patient expressed understanding and verbally consented to this telephonic visit.    Interactive audio and video telecommunications were attempted between this provider and patient, however failed, due to patient having technical difficulties OR patient did not have access to video capability.  We continued and completed visit with audio only.    Review of Systems     Cardiac Risk Factors include: advanced age (>2055men, 13>65 women);male gender     Objective:    Today's Vitals   There is no height or weight on file to calculate BMI.     09/09/2021    1:59 PM 12/24/2014    8:45 AM 09/08/2013   10:16 AM 12/21/2011    1:00 AM  Advanced Directives  Does Patient Have a Medical Advance Directive? No No Patient would not like information Patient does not have advance directive;Patient would like information  Would patient like information on creating a medical advance directive? No - Patient declined   Advance directive packet given  Pre-existing out of facility DNR order (yellow form or pink MOST form)    No    Current Medications (verified) Outpatient Encounter Medications as of 09/09/2021  Medication Sig   albuterol (VENTOLIN HFA) 108 (90 Base) MCG/ACT inhaler Inhale 2 puffs into the lungs every 6 (six) hours as needed for wheezing or shortness of breath.    aspirin 81 MG tablet Take 81 mg by mouth daily.   Cholecalciferol (VITAMIN D3) 1000 UNITS CAPS Take 1 capsule by mouth daily.   Coenzyme Q10 (CO Q 10 PO) Take 1 tablet by mouth daily.   Cyanocobalamin (VITAMIN B-12 PO) Take 1 tablet by mouth daily.   ezetimibe (ZETIA) 10 MG tablet Take 1 tablet (10 mg total) by mouth daily.   fluticasone (FLONASE) 50 MCG/ACT nasal spray Place 1-2 sprays into both nostrils daily.   KRILL OIL PO Take 1 tablet by mouth daily.   lisinopril-hydrochlorothiazide (ZESTORETIC) 20-12.5 MG tablet Take 2 tablets by mouth daily.   MAGNESIUM PO Take 1 tablet by mouth daily.   Multiple Vitamins-Minerals (CENTRUM SILVER PO) Take 1 tablet by mouth daily.   Multiple Vitamins-Minerals (ZINC PO) Take 1 tablet by mouth daily.   nicotine (NICODERM CQ - DOSED IN MG/24 HOURS) 14 mg/24hr patch 14mg /day for 6 weeks then 7mg /day x 2 weeks   omeprazole (PRILOSEC) 20 MG capsule Take 1 capsule (20 mg total) by mouth daily.   rosuvastatin (CRESTOR) 5 MG tablet Take 1 tablet (5 mg total) by mouth at bedtime.   TURMERIC PO Take 1 tablet by mouth daily.    umeclidinium-vilanterol (ANORO ELLIPTA) 62.5-25 MCG/ACT AEPB Inhale 1 puff into the lungs daily.   metoprolol tartrate (LOPRESSOR) 25 MG tablet Take 1 tablet (25 mg total) by mouth once for 1 dose. Please take 2 hours before CT   No facility-administered encounter medications on file as of 09/09/2021.    Allergies (verified)  Lipitor [atorvastatin] and Pravastatin   History: Past Medical History:  Diagnosis Date   Aortic regurgitation    Aortic stenosis 04/04/2018   Atherosclerosis of native arteries of the extremities with intermittent claudication 09/08/2013   Bilateral impacted cerumen 10/13/2018   Bilateral sensorineural hearing loss 12/14/2018   Former moderate cigarette smoker (10-19 per day) 05/20/2012   Quit smoking Nov 2013 when hospitalized w/ HTN and chest pain(diagnosed w/ valvular heart disease)   GERD (gastroesophageal  reflux disease)    Heart murmur    Hyperlipidemia    Hypertension    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   Peripheral vascular disease with claudication    ABI .49     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   Substance abuse (HCC)    Past Surgical History:  Procedure Laterality Date   NO PAST SURGERIES     Family History  Problem Relation Age of Onset   Other Brother    Hypertension Brother    Hyperlipidemia Brother    Arthritis Mother    Hypertension Mother    Hyperlipidemia Mother    Colon cancer Mother        36's   Hypertension Sister    Hyperlipidemia Sister    Diabetes Sister    Heart attack Father    Hyperlipidemia Brother    Social History   Socioeconomic History   Marital status: Married    Spouse name: Not on file   Number of children: Not on file   Years of education: Not on file   Highest education level: Not on file  Occupational History   Occupation: Recruitment consultant  Tobacco Use   Smoking status: Former    Packs/day: 1.00    Years: 30.00    Total pack years: 30.00    Types: Cigarettes    Quit date: 12/21/2011    Years since quitting: 9.7   Smokeless tobacco: Never  Vaping Use   Vaping Use: Every day  Substance and Sexual Activity   Alcohol use: Yes    Comment: Ocassionaly.    Drug use: Not Currently    Frequency: 1.0 times per week    Types: Marijuana   Sexual activity: Yes    Birth control/protection: None  Other Topics Concern   Not on file  Social History Narrative   Married. Education: [employer]. Exercise:  "Somewhat".   Social Determinants of Health   Financial Resource Strain: Low Risk  (09/09/2021)   Overall Financial Resource Strain (CARDIA)    Difficulty of Paying Living Expenses: Not hard at all  Food Insecurity: No Food Insecurity (09/09/2021)   Hunger Vital Sign    Worried About Running Out of Food in the Last Year: Never true    Ran Out of Food in the Last Year: Never true  Transportation Needs: No Transportation  Needs (09/09/2021)   PRAPARE - Administrator, Civil Service (Medical): No    Lack of Transportation (Non-Medical): No  Physical Activity: Inactive (09/09/2021)   Exercise Vital Sign    Days of Exercise per Week: 0 days    Minutes of Exercise per Session: 0 min  Stress: No Stress Concern Present (09/09/2021)   [employer] of Occupational Health - Occupational Stress Questionnaire    Feeling of Stress : Not at all  Social Connections: Socially Integrated (09/09/2021)   Social Connection and Isolation Panel [NHANES]    Frequency of Communication with Friends and Family: Three times a week    Frequency of Social Gatherings with  Friends and Family: Three times a week    Attends Religious Services: More than 4 times per year    Active Member of Clubs or Organizations: Yes    Attends Engineer, structural: More than 4 times per year    Marital Status: Married    Tobacco Counseling Counseling given: Not Answered   Clinical Intake:  Pre-visit preparation completed: Yes  Pain : No/denies pain     Nutritional Risks: None Diabetes: No  How often do you need to have someone help you when you read instructions, pamphlets, or other written materials from your doctor or pharmacy?: 1 - Never What is the last grade level you completed in school?: college  Diabetic?no   Interpreter Needed?: No  Information entered by :: L.Wilson,LPN   Activities of Daily Living    09/09/2021    1:59 PM 09/05/2021    3:48 PM  In your present state of health, do you have any difficulty performing the following activities:  Hearing? 0 1  Vision? 0 1  Difficulty concentrating or making decisions? 0   Walking or climbing stairs? 0 1  Dressing or bathing? 0 0  Doing errands, shopping? 0 0  Preparing Food and eating ? N N  Using the Toilet? N N  In the past six months, have you accidently leaked urine? N N  Do you have problems with loss of bowel control? N N  Managing your  Medications? N N  Managing your Finances? N N  Housekeeping or managing your Housekeeping? N N    Patient Care Team: [name], MD as PCP - General (Family Medicine)  Indicate any recent Medical Services you may have received from other than [facility] providers in the past year (date may be approximate).     Assessment:   This is a routine wellness examination for Albert Hardy.  Hearing/Vision screen Vision Screening - Comments:: Annual eye exams wear glasses   Dietary issues and exercise activities discussed: Current Exercise Habits: The patient does not participate in regular exercise at present, Exercise limited by: None identified   Goals Addressed   None    Depression Screen    09/09/2021    1:59 PM 09/09/2021    1:58 PM 06/12/2021   11:51 AM 04/24/2020   11:14 AM 10/25/2019    2:09 PM 04/26/2019    2:32 PM 03/29/2019    1:39 PM  PHQ 2/9 Scores  PHQ - 2 Score 0 0 3 0 1 0 0  PHQ- 9 Score   9        Fall Risk    09/09/2021    1:59 PM 09/05/2021    3:48 PM 04/24/2020   11:14 AM 10/25/2019    2:08 PM 04/26/2019    2:32 PM  Fall Risk   Falls in the past year? 0 0 0 0 0  Number falls in past yr: 0 0 0 0   Injury with Fall? 0 0 0 0   Risk for fall due to :    No Fall Risks   Follow up Falls evaluation completed;Education provided  Falls evaluation completed Falls evaluation completed Falls evaluation completed    FALL RISK PREVENTION PERTAINING TO THE HOME:  Any stairs in or around the home? No  If so, are there any without handrails? No  Home free of loose throw rugs in walkways, pet beds, electrical cords, etc? Yes  Adequate lighting in your home to reduce risk of falls? Yes   ASSISTIVE  DEVICES UTILIZED TO PREVENT FALLS:  Life alert? No  Use of a cane, walker or w/c? No  Grab bars in the bathroom? No  Shower chair or bench in shower? No  Elevated toilet seat or a handicapped toilet? No     Cognitive Function:    Normal cognitive status assessed by telephone  conversation  by this Nurse Health Advisor. No abnormalities found.      09/09/2021    2:01 PM  6CIT Screen  What Year? 0 points  What month? 0 points  What time? 0 points  Count back from 20 0 points  Months in reverse 0 points  Repeat phrase 0 points  Total Score 0 points    Immunizations Immunization History  Administered Date(s) Administered   Influenza,inj,Quad PF,6+ Mos 10/25/2019   Moderna Sars-Covid-2 Vaccination 06/14/2019, 07/19/2019   PFIZER(Purple Top)SARS-COV-2 Vaccination 04/09/2020, 07/25/2020   PNEUMOCOCCAL CONJUGATE-20 06/12/2021   Tdap 08/27/2014   Zoster Recombinat (Shingrix) 10/25/2019    TDAP status: Up to date  Flu Vaccine status: Up to date  Pneumococcal vaccine status: Up to date  Covid-19 vaccine status: Completed vaccines  Qualifies for Shingles Vaccine? Yes   Zostavax completed Yes   Shingrix Completed?: Yes  Screening Tests Health Maintenance  Topic Date Due   COVID-19 Vaccine (5 - Moderna series) 09/19/2020   INFLUENZA VACCINE  09/09/2021   Zoster Vaccines- Shingrix (2 of 2) 09/12/2021 (Originally 12/20/2019)   TETANUS/TDAP  08/26/2024   COLONOSCOPY (Pts 45-93yrs Insurance coverage will need to be confirmed)  12/23/2024   Pneumonia Vaccine 21+ Years old  Completed   Hepatitis C Screening  Completed   HPV VACCINES  Aged Out    Health Maintenance  Health Maintenance Due  Topic Date Due   COVID-19 Vaccine (5 - Moderna series) 09/19/2020   INFLUENZA VACCINE  09/09/2021    Colorectal cancer screening: Type of screening: Colonoscopy. Completed 12/24/2014. Repeat every 10 years  Lung Cancer Screening: (Low Dose CT Chest recommended if Age 43-80 years, 30 pack-year currently smoking OR have quit w/in 15years.) does not qualify.   Lung Cancer Screening Referral: n/a  Additional Screening:  Hepatitis C Screening: does not qualify;   Vision Screening: Recommended annual ophthalmology exams for early detection of glaucoma and other  disorders of the eye. Is the patient up to date with their annual eye exam?  Yes  Who is the provider or what is the name of the office in which the patient attends annual eye exams? [facility] If pt is not established with a provider, would they like to be referred to a provider to establish care? No .   Dental Screening: Recommended annual dental exams for proper oral hygiene  Community Resource Referral / Chronic Care Management: CRR required this visit?  No   CCM required this visit?  No      Plan:     I have personally reviewed and noted the following in the patient's chart:   Medical and social history Use of alcohol, tobacco or illicit drugs  Current medications and supplements including opioid prescriptions. Patient is not currently taking opioid prescriptions. Functional ability and status Nutritional status Physical activity Advanced directives List of other physicians Hospitalizations, surgeries, and ER visits in previous 12 months Vitals Screenings to include cognitive, depression, and falls Referrals and appointments  In addition, I have reviewed and discussed with patient certain preventive protocols, quality metrics, and best practice recommendations. A written personalized care plan for preventive services as well as general preventive  health recommendations were provided to patient.     Lorrene Reid, LPN   03/[id number]/[id number]   Nurse Notes: none

## 2021-09-09 NOTE — Patient Instructions (Signed)
Mr. Albert Hardy , Thank you for taking time to come for your Medicare Wellness Visit. I appreciate your ongoing commitment to your health goals. Please review the following plan we discussed and let me know if I can assist you in the future.   Screening recommendations/referrals: Colonoscopy: 12/24/2014 Recommended yearly ophthalmology/optometry visit for glaucoma screening and checkup Recommended yearly dental visit for hygiene and checkup  Vaccinations: Influenza vaccine: completed  Pneumococcal vaccine: completed  Tdap vaccine: 08/27/2014 Shingles vaccine: completed     Advanced directives: none   Conditions/risks identified: none   Next appointment: none   Preventive Care 65 Years and Older, Male Preventive care refers to lifestyle choices and visits with your health care provider that can promote health and wellness. What does preventive care include? A yearly physical exam. This is also called an annual well check. Dental exams once or twice a year. Routine eye exams. Ask your health care provider how often you should have your eyes checked. Personal lifestyle choices, including: Daily care of your teeth and gums. Regular physical activity. Eating a healthy diet. Avoiding tobacco and drug use. Limiting alcohol use. Practicing safe sex. Taking low doses of aspirin every day. Taking vitamin and mineral supplements as recommended by your health care provider. What happens during an annual well check? The services and screenings done by your health care provider during your annual well check will depend on your age, overall health, lifestyle risk factors, and family history of disease. Counseling  Your health care provider may ask you questions about your: Alcohol use. Tobacco use. Drug use. Emotional well-being. Home and relationship well-being. Sexual activity. Eating habits. History of falls. Memory and ability to understand (cognition). Work and work  Astronomer. Screening  You may have the following tests or measurements: Height, weight, and BMI. Blood pressure. Lipid and cholesterol levels. These may be checked every 5 years, or more frequently if you are over 56 years old. Skin check. Lung cancer screening. You may have this screening every year starting at age 79 if you have a 30-pack-year history of smoking and currently smoke or have quit within the past 15 years. Fecal occult blood test (FOBT) of the stool. You may have this test every year starting at age 78. Flexible sigmoidoscopy or colonoscopy. You may have a sigmoidoscopy every 5 years or a colonoscopy every 10 years starting at age 38. Prostate cancer screening. Recommendations will vary depending on your family history and other risks. Hepatitis C blood test. Hepatitis B blood test. Sexually transmitted disease (STD) testing. Diabetes screening. This is done by checking your blood sugar (glucose) after you have not eaten for a while (fasting). You may have this done every 1-3 years. Abdominal aortic aneurysm (AAA) screening. You may need this if you are a current or former smoker. Osteoporosis. You may be screened starting at age 1 if you are at high risk. Talk with your health care provider about your test results, treatment options, and if necessary, the need for more tests. Vaccines  Your health care provider may recommend certain vaccines, such as: Influenza vaccine. This is recommended every year. Tetanus, diphtheria, and acellular pertussis (Tdap, Td) vaccine. You may need a Td booster every 10 years. Zoster vaccine. You may need this after age 1. Pneumococcal 13-valent conjugate (PCV13) vaccine. One dose is recommended after age 48. Pneumococcal polysaccharide (PPSV23) vaccine. One dose is recommended after age 2. Talk to your health care provider about which screenings and vaccines you need and how often you need  them. This information is not intended to replace  advice given to you by your health care provider. Make sure you discuss any questions you have with your health care provider. Document Released: 02/22/2015 Document Revised: 10/16/2015 Document Reviewed: 11/27/2014 Elsevier Interactive Patient Education  2017 [place] Prevention in the Home Falls can cause injuries. They can happen to people of all ages. There are many things you can do to make your home safe and to help prevent falls. What can I do on the outside of my home? Regularly fix the edges of walkways and driveways and fix any cracks. Remove anything that might make you trip as you walk through a door, such as a raised step or threshold. Trim any bushes or trees on the path to your home. Use bright outdoor lighting. Clear any walking paths of anything that might make someone trip, such as rocks or tools. Regularly check to see if handrails are loose or broken. Make sure that both sides of any steps have handrails. Any raised decks and porches should have guardrails on the edges. Have any leaves, snow, or ice cleared regularly. Use sand or salt on walking paths during winter. Clean up any spills in your garage right away. This includes oil or grease spills. What can I do in the bathroom? Use night lights. Install grab bars by the toilet and in the tub and shower. Do not use towel bars as grab bars. Use non-skid mats or decals in the tub or shower. If you need to sit down in the shower, use a plastic, non-slip stool. Keep the floor dry. Clean up any water that spills on the floor as soon as it happens. Remove soap buildup in the tub or shower regularly. Attach bath mats securely with double-sided non-slip rug tape. Do not have throw rugs and other things on the floor that can make you trip. What can I do in the bedroom? Use night lights. Make sure that you have a light by your bed that is easy to reach. Do not use any sheets or blankets that are too big for your bed.  They should not hang down onto the floor. Have a firm chair that has side arms. You can use this for support while you get dressed. Do not have throw rugs and other things on the floor that can make you trip. What can I do in the kitchen? Clean up any spills right away. Avoid walking on wet floors. Keep items that you use a lot in easy-to-reach places. If you need to reach something above you, use a strong step stool that has a grab bar. Keep electrical cords out of the way. Do not use floor polish or wax that makes floors slippery. If you must use wax, use non-skid floor wax. Do not have throw rugs and other things on the floor that can make you trip. What can I do with my stairs? Do not leave any items on the stairs. Make sure that there are handrails on both sides of the stairs and use them. Fix handrails that are broken or loose. Make sure that handrails are as long as the stairways. Check any carpeting to make sure that it is firmly attached to the stairs. Fix any carpet that is loose or worn. Avoid having throw rugs at the top or bottom of the stairs. If you do have throw rugs, attach them to the floor with carpet tape. Make sure that you have a light switch at  the top of the stairs and the bottom of the stairs. If you do not have them, ask someone to add them for you. What else can I do to help prevent falls? Wear shoes that: Do not have high heels. Have rubber bottoms. Are comfortable and fit you well. Are closed at the toe. Do not wear sandals. If you use a stepladder: Make sure that it is fully opened. Do not climb a closed stepladder. Make sure that both sides of the stepladder are locked into place. Ask someone to hold it for you, if possible. Clearly mark and make sure that you can see: Any grab bars or handrails. First and last steps. Where the edge of each step is. Use tools that help you move around (mobility aids) if they are needed. These  include: Canes. Walkers. Scooters. Crutches. Turn on the lights when you go into a dark area. Replace any light bulbs as soon as they burn out. Set up your furniture so you have a clear path. Avoid moving your furniture around. If any of your floors are uneven, fix them. If there are any pets around you, be aware of where they are. Review your medicines with your doctor. Some medicines can make you feel dizzy. This can increase your chance of falling. Ask your doctor what other things that you can do to help prevent falls. This information is not intended to replace advice given to you by your health care provider. Make sure you discuss any questions you have with your health care provider. Document Released: 11/22/2008 Document Revised: 07/04/2015 Document Reviewed: 03/02/2014 Elsevier Interactive Patient Education  2017 [employer].

## 2021-09-10 ENCOUNTER — Other Ambulatory Visit: Payer: Self-pay | Admitting: Cardiology

## 2021-09-10 ENCOUNTER — Other Ambulatory Visit (HOSPITAL_COMMUNITY): Payer: Self-pay | Admitting: Cardiology

## 2021-09-10 DIAGNOSIS — I739 Peripheral vascular disease, unspecified: Secondary | ICD-10-CM

## 2021-09-17 ENCOUNTER — Ambulatory Visit (HOSPITAL_COMMUNITY)
Admission: RE | Admit: 2021-09-17 | Discharge: 2021-09-17 | Disposition: A | Discharge status: Home / Self Care | Payer: Medicare Other | Source: Ambulatory Visit | Attending: Cardiovascular Disease | Admitting: Cardiovascular Disease

## 2021-09-17 DIAGNOSIS — I739 Peripheral vascular disease, unspecified: Secondary | ICD-10-CM

## 2021-09-19 ENCOUNTER — Encounter (HOSPITAL_COMMUNITY): Payer: Self-pay

## 2021-09-22 ENCOUNTER — Telehealth (HOSPITAL_COMMUNITY): Payer: Self-pay | Admitting: *Deleted

## 2021-09-22 NOTE — Telephone Encounter (Signed)
Reaching out to patient to offer assistance regarding upcoming cardiac imaging study; pt verbalizes understanding of appt date/time, parking situation and where to check in, pre-test NPO status, and verified current allergies; name and call back number provided for further questions should they arise  Niajah Sipos RN Navigator Cardiac Imaging [facility] [phone] office [phone] cell  Patient aware to arrive at 9:30am. 

## 2021-09-23 ENCOUNTER — Ambulatory Visit
Admission: RE | Admit: 2021-09-23 | Discharge: 2021-09-23 | Disposition: A | Discharge status: Home / Self Care | Payer: Medicare Other | Source: Ambulatory Visit | Attending: Acute Care | Admitting: Acute Care

## 2021-09-23 ENCOUNTER — Ambulatory Visit (HOSPITAL_COMMUNITY)
Admission: RE | Admit: 2021-09-23 | Discharge: 2021-09-23 | Disposition: A | Discharge status: Home / Self Care | Payer: Medicare Other | Source: Ambulatory Visit | Attending: Cardiology | Admitting: Cardiology

## 2021-09-23 DIAGNOSIS — R079 Chest pain, unspecified: Secondary | ICD-10-CM | POA: Diagnosis present

## 2021-09-23 DIAGNOSIS — Z87891 Personal history of nicotine dependence: Secondary | ICD-10-CM

## 2021-09-23 DIAGNOSIS — E7800 Pure hypercholesterolemia, unspecified: Secondary | ICD-10-CM | POA: Diagnosis present

## 2021-09-23 DIAGNOSIS — I739 Peripheral vascular disease, unspecified: Secondary | ICD-10-CM | POA: Insufficient documentation

## 2021-09-23 DIAGNOSIS — I350 Nonrheumatic aortic (valve) stenosis: Secondary | ICD-10-CM | POA: Diagnosis present

## 2021-09-23 DIAGNOSIS — I351 Nonrheumatic aortic (valve) insufficiency: Secondary | ICD-10-CM | POA: Diagnosis present

## 2021-09-23 DIAGNOSIS — I352 Nonrheumatic aortic (valve) stenosis with insufficiency: Secondary | ICD-10-CM

## 2021-09-23 DIAGNOSIS — I493 Ventricular premature depolarization: Secondary | ICD-10-CM | POA: Diagnosis present

## 2021-09-23 MED ORDER — NITROGLYCERIN 0.4 MG SL SUBL
0.8000 mg | SUBLINGUAL_TABLET | Freq: Once | SUBLINGUAL | Status: AC
  Administered 2021-09-23: 0.8 mg via SUBLINGUAL

## 2021-09-23 MED ORDER — NITROGLYCERIN 0.4 MG SL SUBL
SUBLINGUAL_TABLET | SUBLINGUAL | Status: DC
Start: 2021-09-23 — End: 2021-09-23
  Filled 2021-09-23: qty 2

## 2021-09-23 MED ORDER — IOHEXOL 350 MG/ML SOLN
100.0000 mL | Freq: Once | INTRAVENOUS | Status: AC | PRN
  Administered 2021-09-23: 100 mL via INTRAVENOUS

## 2021-09-24 ENCOUNTER — Ambulatory Visit (HOSPITAL_BASED_OUTPATIENT_CLINIC_OR_DEPARTMENT_OTHER)
Admission: RE | Admit: 2021-09-24 | Discharge: 2021-09-24 | Disposition: A | Discharge status: Home / Self Care | Payer: Medicare Other | Source: Ambulatory Visit | Attending: Cardiology | Admitting: Cardiology

## 2021-09-24 DIAGNOSIS — R079 Chest pain, unspecified: Secondary | ICD-10-CM | POA: Insufficient documentation

## 2021-09-24 DIAGNOSIS — I739 Peripheral vascular disease, unspecified: Secondary | ICD-10-CM | POA: Insufficient documentation

## 2021-09-24 DIAGNOSIS — I351 Nonrheumatic aortic (valve) insufficiency: Secondary | ICD-10-CM | POA: Diagnosis present

## 2021-09-24 DIAGNOSIS — I493 Ventricular premature depolarization: Secondary | ICD-10-CM | POA: Insufficient documentation

## 2021-09-24 DIAGNOSIS — E7800 Pure hypercholesterolemia, unspecified: Secondary | ICD-10-CM | POA: Diagnosis present

## 2021-09-24 DIAGNOSIS — I350 Nonrheumatic aortic (valve) stenosis: Secondary | ICD-10-CM | POA: Diagnosis present

## 2021-09-24 LAB — ECHOCARDIOGRAM COMPLETE
AR max vel: 2.16 cm2
AV Area VTI: 2.53 cm2
AV Area mean vel: 2.13 cm2
AV Mean grad: 10.5 mmHg
AV Peak grad: 21.6 mmHg
Ao pk vel: 2.33 m/s
S' Lateral: 3.2 cm

## 2021-09-24 NOTE — Progress Notes (Signed)
  Echocardiogram 2D Echocardiogram has been performed.  Albert Hardy F 09/24/2021, 10:37 AM

## 2021-09-26 ENCOUNTER — Telehealth: Payer: Self-pay | Admitting: Acute Care

## 2021-09-26 DIAGNOSIS — R911 Solitary pulmonary nodule: Secondary | ICD-10-CM

## 2021-09-26 DIAGNOSIS — Z87891 Personal history of nicotine dependence: Secondary | ICD-10-CM

## 2021-09-26 NOTE — Telephone Encounter (Signed)
I have called the patient with the results of the low dose CT Chest. I explained that his scan was read as a lung RADS 3, probably benign findings.  Recommendation is for a 62-month follow-up repeat low-dose CT.  There was notation of a new clustered indistinct peribronchovascular nodularity in the posterior medial basilar right lower lobe largest which measures 5.3 mm in volume derived mean diameter most likely nonspecific mild infectious or inflammatory bronchiolitis.  I explained to the patient that radiology feel like this is most likely infectious or inflammatory.  He states he has had increase in mucus production over the last several weeks which correlates with this finding.  He is in agreement with 54-month follow-up.  Angelique Blonder please place order for 65-month follow-up low-dose CT which will be due in February 2024, fax results to PCP and let him know plan for 50-month follow-up.  Thank you so much

## 2021-09-29 NOTE — Telephone Encounter (Signed)
CT results faxed to PCP with follow up plans included. Order placed for 6 month nodule f/u CT.

## 2021-09-30 ENCOUNTER — Telehealth: Payer: Self-pay

## 2021-09-30 DIAGNOSIS — I739 Peripheral vascular disease, unspecified: Secondary | ICD-10-CM

## 2021-09-30 NOTE — Telephone Encounter (Signed)
-----   Message from Neena Rhymes, RN sent at 09/19/2021  1:03 PM EDT -----  ----- Message ----- From: Baldo Daub, MD Sent: 09/17/2021   1:27 PM EDT To: Mickie Bail Ash/Hp Triage  Abnormal ABI and moderate obstruction of blood flow both lower extremities  Lets go ahead and set him up for bilateral lower extremity arterial duplex for claudication

## 2021-10-02 ENCOUNTER — Other Ambulatory Visit: Payer: Self-pay

## 2021-10-02 DIAGNOSIS — I350 Nonrheumatic aortic (valve) stenosis: Secondary | ICD-10-CM

## 2021-10-16 ENCOUNTER — Other Ambulatory Visit: Payer: Self-pay | Admitting: Cardiology

## 2021-10-16 DIAGNOSIS — I739 Peripheral vascular disease, unspecified: Secondary | ICD-10-CM

## 2021-10-20 NOTE — Progress Notes (Unsigned)
Cardiology Office Note:    Date:  10/21/2021   ID:  Albert Davies., DOB 17-Apr-1955, MRN [id number]  PCP:  Wendie Agreste, MD  Cardiologist:  Shirlee More, MD    Referring MD: Wendie Agreste, MD    ASSESSMENT:    1. Pure hypercholesterolemia   2. Nonrheumatic aortic valve stenosis   3. Frequent PVCs    PLAN:    In order of problems listed above:  Despite symptoms that were quite suggestive of typical angina his cardiac CTA is normal he said no further chest pain.  He elects to retain remain on lipid-lowering therapy with rosuvastatin and Zetia.  We will recheck his lipid profile today along with liver function test. Fortunately no progression in his aortic valvular disease mild stenosis mild regurgitation and will continue low-dose aspirin.  Need a follow-up echocardiogram in 1 to 2 years And proved with beta-blocker continue metoprolol   Next appointment: 1 year   Medication Adjustments/Labs and Tests Ordered: Current medicines are reviewed at length with the patient today.  Concerns regarding medicines are outlined above.  Orders Placed This Encounter  Procedures   Lipid Profile   Comp Met (CMET)   No orders of the defined types were placed in this encounter.   Chief Complaint  Patient presents with   Follow-up   Chest Pain    After multiple cardiac test echo and CTA monitor and is awaiting lower extremity vascular studies for claudication    History of Present Illness:    Albert Wiegand. is a 66 y.o. male with a hx of aortic valve disease with aortic regurgitation and aortic stenosis hyperlipidemia frequent PVCs peripheral vascular disease with claudication and chest pain last seen 09/04/2020.  Compliance with diet, lifestyle and medications: Yes  He is very reassured by the results of his testing including normal coronary angiography and CTA calcium score of 0 mild aortic stenosis and regurgitation and isolated ventricular ectopy on his monitor.  We  discussed and he decided to remain on lipid-lowering therapy.  He has good technique knowledge and follows home blood pressure that remains in range.  He does have typical symptoms of claudication and is scheduled for vascular testing end of the week  Multiple cardiac diagnostic test were performed including:  Echocardiogram showing EF 60 to 16% normal systolic and diastolic function normal right ventricular size and function with mild right atrial enlargement.  There is mild aortic stenosis and mild aortic regurgitation present.  Cardiac CTA was reported 09/23/2021 showing mild calcification of the aortic valve coronary artery calcium score of 0 and no CAD noticed.    Over read the CT chest showed mucous plugging and liver abnormalities most consistent with hemangioma. His event monitor reported 10/05/2021 showed sinus rhythm throughout isolated ventricular ectopy isolated supraventricular ectopy with 4 episodes of SVT the longest 8 complexes a rate of 100 bpm.  Vascular studies are pending. 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   aspirin 81 MG tablet Take 81 mg by mouth daily.   Cholecalciferol (VITAMIN D3) 1000 UNITS CAPS Take 1 capsule by mouth daily.   Coenzyme Q10 (CO Q 10 PO) Take 1 tablet by mouth daily.   Cyanocobalamin (VITAMIN B-12 PO) Take 1 tablet by mouth daily.   ezetimibe (ZETIA) 10 MG tablet Take 1 tablet (10 mg total) by mouth daily.   fluticasone (FLONASE) 50 MCG/ACT nasal spray Place 1-2 sprays into both nostrils daily.   KRILL OIL PO Take 1 tablet by mouth daily.   lisinopril-hydrochlorothiazide (ZESTORETIC) 20-12.5 MG tablet Take 2 tablets by mouth daily.   MAGNESIUM PO Take 1 tablet by mouth daily.   Multiple Vitamins-Minerals (CENTRUM SILVER PO) Take 1 tablet by mouth daily.   Multiple Vitamins-Minerals (ZINC PO) Take 1 tablet by mouth daily.   nicotine (NICODERM CQ - DOSED IN MG/24 HOURS) 14 mg/24hr patch 64m/day for 6 weeks then 776mday x 2 weeks   omeprazole (PRILOSEC) 20 MG capsule Take 1 capsule (20 mg total) by mouth daily.   rosuvastatin (CRESTOR) 5 MG tablet Take 1 tablet (5 mg total) by mouth at bedtime.   TURMERIC PO Take 1 tablet by mouth daily.    umeclidinium-vilanterol (ANORO ELLIPTA) 62.5-25 MCG/ACT AEPB Inhale 1 puff into the lungs daily.     Allergies:   Lipitor [atorvastatin] and Pravastatin   Social History   Socioeconomic History   Marital status: Married    Spouse name: Not on file   Number of children: Not on file   Years of education: Not on file   Highest education level: Not on file  Occupational History   Occupation: AuWarden/rangerTobacco Use   Smoking status: Former    Packs/day: 1.00    Years: 30.00    Total pack years: 30.00    Types: Cigarettes    Quit date: 12/21/2011    Years since quitting: 9.8   Smokeless tobacco: Never  Vaping Use   Vaping Use: Every day  Substance and Sexual Activity   Alcohol use: Yes    Comment: Ocassionaly.    Drug use: Not Currently     Frequency: 1.0 times per week    Types: Marijuana   Sexual activity: Yes    Birth control/protection: None  Other Topics Concern   Not on file  Social History Narrative   Married. Education: Co[employer]Exercise:  "Somewhat".   Social Determinants of Health   Financial Resource Strain: Low Risk  (09/09/2021)   Overall Financial Resource Strain (CARDIA)    Difficulty of Paying Living Expenses: Not hard at all  Food Insecurity: No Food Insecurity (09/09/2021)   Hunger Vital Sign    Worried About Running Out of Food in the Last Year: Never true    Ran Out of Food in the Last Year: Never true  Transportation Needs: No Transportation Needs (09/09/2021)   PRAPARE - TrHydrologistMedical): No    Lack of Transportation (Non-Medical): No  Physical Activity: Inactive (09/09/2021)   Exercise  Vital Sign    Days of Exercise per Week: 0 days    Minutes of Exercise per Session: 0 min  Stress: No Stress Concern Present (09/09/2021)   [place]    Feeling of Stress : Not at all  Social Connections: Socially Integrated (09/09/2021)   Social Connection and Isolation Panel [NHANES]    Frequency of Communication with Friends and Family: Three times a week    Frequency of Social Gatherings with Friends and Family: Three times a week    Attends Religious Services: More than 4 times per year    Active Member of Clubs or Organizations: Yes    Attends Music therapist: More than 4 times per year    Marital Status: Married     Family History: The patient's family history includes Arthritis in his mother; Colon cancer in his mother; Diabetes in his sister; Heart attack in his father; Hyperlipidemia in his brother, brother, mother, and sister; Hypertension in his brother, mother, and sister; Other in his brother. ROS:   Please see the history of present illness.    All other systems reviewed and are  negative.  EKGs/Labs/Other Studies Reviewed:    The following studies were reviewed today:   Recent Labs: 06/12/2021: ALT 19; BUN 15; Creatinine, Ser 1.19; Potassium 4.1; Sodium 141  Recent Lipid Panel    Component Value Date/Time   CHOL 216 (H) 06/12/2021 1423   CHOL 232 (H) 04/29/2020 1420   TRIG 72.0 06/12/2021 1423   HDL 75.40 06/12/2021 1423   HDL 77 04/29/2020 1420   CHOLHDL 3 06/12/2021 1423   VLDL 14.4 06/12/2021 1423   LDLCALC 126 (H) 06/12/2021 1423   LDLCALC 150 (H) 04/29/2020 1420    Physical Exam:    VS:  BP (!) 144/68 (BP Location: Left Arm, Patient Position: Sitting)   Ht _0  (1.753 m)   Wt 170 lb (77.1 kg)   SpO2 98%   BMI 25.10 kg/m     Wt Readings from Last 3 Encounters:  10/21/21 170 lb (77.1 kg)  09/04/21 170 lb (77.1 kg)  06/12/21 168 lb 3.2 oz (76.3 kg)     GEN:  Well nourished, well developed in no acute distress HEENT: Normal NECK: No JVD; No carotid bruits LYMPHATICS: No lymphadenopathy CARDIAC: RRR, no murmurs, rubs, gallops RESPIRATORY:  Clear to auscultation without rales, wheezing or rhonchi  ABDOMEN: Soft, non-tender, non-distended MUSCULOSKELETAL:  No edema; No deformity  SKIN: Warm and dry NEUROLOGIC:  Alert and oriented x 3 PSYCHIATRIC:  Normal affect    Signed, Shirlee More, MD  10/21/2021 3:41 PM    [facility]

## 2021-10-21 ENCOUNTER — Encounter: Payer: Self-pay | Admitting: Cardiology

## 2021-10-21 ENCOUNTER — Ambulatory Visit: Payer: Medicare Other | Attending: Cardiology | Admitting: Cardiology

## 2021-10-21 VITALS — BP 144/68 | Ht 69.0 in | Wt 170.0 lb

## 2021-10-21 DIAGNOSIS — I493 Ventricular premature depolarization: Secondary | ICD-10-CM

## 2021-10-21 DIAGNOSIS — I350 Nonrheumatic aortic (valve) stenosis: Secondary | ICD-10-CM

## 2021-10-21 DIAGNOSIS — E7800 Pure hypercholesterolemia, unspecified: Secondary | ICD-10-CM | POA: Diagnosis present

## 2021-10-21 NOTE — Patient Instructions (Signed)
Medication Instructions:  Your physician recommends that you continue on your current medications as directed. Please refer to the Current Medication list given to you today.  *If you need a refill on your cardiac medications before your next appointment, please call your pharmacy*   Lab Work: Your physician recommends that you return for lab work in:   Labs today in [address]: CMP, Lipid  If you have labs (blood work) drawn today and your tests are completely normal, you will receive your results only by: MyChart Message (if you have MyChart) OR A paper copy in the mail If you have any lab test that is abnormal or we need to change your treatment, we will call you to review the results.   Testing/Procedures: None   Follow-Up: At [facility], you and your health needs are our priority.  As part of our continuing mission to provide you with exceptional heart care, we have created designated Provider Care Teams.  These Care Teams include your primary Cardiologist (physician) and Advanced Practice Providers (APPs -  Physician Assistants and Nurse Practitioners) who all work together to provide you with the care you need, when you need it.  We recommend signing up for the patient portal called "MyChart".  Sign up information is provided on this After Visit Summary.  MyChart is used to connect with patients for Virtual Visits (Telemedicine).  Patients are able to view lab/test results, encounter notes, upcoming appointments, etc.  Non-urgent messages can be sent to your provider as well.   To learn more about what you can do with MyChart, go to [email or web address].    Your next appointment:   1 year(s)  The format for your next appointment:   In Person  Provider:   Norman Herrlich, MD    Other Instructions None  Important Information About Sugar

## 2021-10-22 LAB — COMPREHENSIVE METABOLIC PANEL
ALT: 18 IU/L (ref 0–44)
AST: 22 IU/L (ref 0–40)
Albumin/Globulin Ratio: 1.6 (ref 1.2–2.2)
Albumin: 4.4 g/dL (ref 3.9–4.9)
Alkaline Phosphatase: 65 IU/L (ref 44–121)
BUN/Creatinine Ratio: 16 (ref 10–24)
BUN: 19 mg/dL (ref 8–27)
Bilirubin Total: <0.2 mg/dL (ref 0.0–1.2)
CO2: 23 mmol/L (ref 20–29)
Calcium: 9.4 mg/dL (ref 8.6–10.2)
Chloride: 106 mmol/L (ref 96–106)
Creatinine, Ser: 1.18 mg/dL (ref 0.76–1.27)
Globulin, Total: 2.7 g/dL (ref 1.5–4.5)
Glucose: 95 mg/dL (ref 70–99)
Potassium: 4.5 mmol/L (ref 3.5–5.2)
Sodium: 145 mmol/L — ABNORMAL HIGH (ref 134–144)
Total Protein: 7.1 g/dL (ref 6.0–8.5)
eGFR: 68 mL/min/{1.73_m2} (ref >59)

## 2021-10-22 LAB — LIPID PANEL
Chol/HDL Ratio: 2.3 ratio (ref 0.0–5.0)
Cholesterol, Total: 169 mg/dL (ref 100–199)
HDL: 72 mg/dL (ref >39)
LDL Chol Calc (NIH): 87 mg/dL (ref 0–99)
Triglycerides: 51 mg/dL (ref 0–149)
VLDL Cholesterol Cal: 10 mg/dL (ref 5–40)

## 2021-10-24 ENCOUNTER — Ambulatory Visit (HOSPITAL_COMMUNITY)
Admission: RE | Admit: 2021-10-24 | Payer: Medicare Other | Source: Ambulatory Visit | Attending: Cardiology | Admitting: Cardiology

## 2021-11-06 ENCOUNTER — Encounter (HOSPITAL_COMMUNITY): Payer: Self-pay

## 2021-11-06 ENCOUNTER — Ambulatory Visit (HOSPITAL_COMMUNITY)
Admission: RE | Admit: 2021-11-06 | Payer: No Typology Code available for payment source | Source: Ambulatory Visit | Attending: Cardiology | Admitting: Cardiology

## 2021-12-12 ENCOUNTER — Ambulatory Visit: Payer: Medicare Other | Admitting: Family Medicine

## 2021-12-21 ENCOUNTER — Other Ambulatory Visit: Payer: Self-pay | Admitting: Family Medicine

## 2021-12-21 DIAGNOSIS — K219 Gastro-esophageal reflux disease without esophagitis: Secondary | ICD-10-CM

## 2021-12-21 DIAGNOSIS — E785 Hyperlipidemia, unspecified: Secondary | ICD-10-CM

## 2022-03-11 ENCOUNTER — Other Ambulatory Visit: Payer: Self-pay | Admitting: Family Medicine

## 2022-03-11 DIAGNOSIS — E785 Hyperlipidemia, unspecified: Secondary | ICD-10-CM

## 2022-03-24 ENCOUNTER — Other Ambulatory Visit: Payer: Self-pay | Admitting: Family Medicine

## 2022-03-24 DIAGNOSIS — E785 Hyperlipidemia, unspecified: Secondary | ICD-10-CM

## 2022-03-24 DIAGNOSIS — K219 Gastro-esophageal reflux disease without esophagitis: Secondary | ICD-10-CM

## 2022-03-30 ENCOUNTER — Ambulatory Visit
Admission: RE | Admit: 2022-03-30 | Discharge: 2022-03-30 | Disposition: A | Discharge status: Home / Self Care | Payer: Medicare Other | Source: Ambulatory Visit | Attending: Acute Care | Admitting: Acute Care

## 2022-03-30 DIAGNOSIS — R911 Solitary pulmonary nodule: Secondary | ICD-10-CM

## 2022-03-30 DIAGNOSIS — Z87891 Personal history of nicotine dependence: Secondary | ICD-10-CM

## 2022-03-31 ENCOUNTER — Telehealth: Payer: Self-pay | Admitting: Acute Care

## 2022-03-31 NOTE — Telephone Encounter (Signed)
LDCT dated   IMPRESSION: 1. Lung-RADS 4A, suspicious. New focal occlusion of the left upper lobe apicoposterior segmental bronchus with new mild patchy tree-in-bud type opacity throughout the posterior left upper lobe. Occlusive endobronchial lesion cannot be excluded, although the differential includes mucus plugging. Suggest further evaluation with chest CT with IV contrast at this time. Alternatively, PET-CT could be obtained for further characterization. 2. Waxing and waning mild patchy tree-in-bud opacities in the posterior lower lobes bilaterally compatible with nonspecific bronchiolitis such as due to recurrent mild aspiration. 3. Aortic Atherosclerosis (ICD10-I70.0) and Emphysema (ICD10-J43.9).   These results will be called to the ordering clinician or representative by the Radiologist Assistant, and communication documented in the PACS or [employer].     Electronically Signed   By: Ilona Sorrel M.D.   On: 03/30/2022 20:05   Routing to Judson Roch

## 2022-04-01 ENCOUNTER — Telehealth (INDEPENDENT_AMBULATORY_CARE_PROVIDER_SITE_OTHER): Payer: Medicare Other | Admitting: Acute Care

## 2022-04-01 ENCOUNTER — Other Ambulatory Visit: Payer: Self-pay

## 2022-04-01 ENCOUNTER — Encounter: Payer: Self-pay | Admitting: Acute Care

## 2022-04-01 DIAGNOSIS — J431 Panlobular emphysema: Secondary | ICD-10-CM | POA: Diagnosis not present

## 2022-04-01 DIAGNOSIS — R911 Solitary pulmonary nodule: Secondary | ICD-10-CM

## 2022-04-01 DIAGNOSIS — T17500A Unspecified foreign body in bronchus causing asphyxiation, initial encounter: Secondary | ICD-10-CM | POA: Diagnosis not present

## 2022-04-01 DIAGNOSIS — Z87891 Personal history of nicotine dependence: Secondary | ICD-10-CM

## 2022-04-01 DIAGNOSIS — F17290 Nicotine dependence, other tobacco product, uncomplicated: Secondary | ICD-10-CM

## 2022-04-01 DIAGNOSIS — R9389 Abnormal findings on diagnostic imaging of other specified body structures: Secondary | ICD-10-CM | POA: Diagnosis not present

## 2022-04-01 NOTE — Progress Notes (Signed)
Virtual Visit via Telephone Note  I connected with Albert Hardy. on 04/01/22 at 11:30 AM EST by telephone and verified that I am speaking with the correct person using two identifiers.  Location: Patient:  At home  Provider:  [place], [place], [place], [address]    I discussed the limitations, risks, security and privacy concerns of performing an evaluation and management service by telephone and the availability of in person appointments. I also discussed with the patient that there may be a patient responsible charge related to this service. The patient expressed understanding and agreed to proceed.  Synopsis Former smoker ( Quit 2013 with a 30 pack year cigarette smoking history)  Transitioned to vaping tobacco in 2013. Pt. Has panlobar emphysema and is followed through the screening program. Pt has had LR 3, and LR 4A scans with concern for occlusive endobronchial lesion vs mucus plugging. Maintenance Therapy  Anoro as Maintenance Albuterol as rescue     History of Present Illness: Pt. Presents for follow up after LDCT. This was a 6 month follow up scan. It has progressed from a LR 3 to a LR 4 A with interval development of New focal occlusion of the left upper lobe apicoposterior segmental bronchus >> Occlusive endobronchial lesion cannot be excluded, although the differential includes mucus plugging. I discussed this with the patient . He denies being sick or choking on food.No fever.  He states secretions are yellow and thick. He is not currently using any Mucinex. We discussed that we need to determine if what we see on the CT Chest is a mucus plug or an endobronchial lesion . If it is a mucus plug that has resolved at follow up we will just follow, but if it has not resolved at follow up we will need to evaluate this more closely. We discussed that we are concerned it could be a cancer. Plan is for mucolytic and flutter valve x 6 weeks then repeat CT Chest.  He  verbalized understanding. I have asked him to stop vaping.    Observations/Objective: Abnormal screening scan 09/23/2021>> LR 3>> New clustered indistinct peribronchovascular nodularity in the posteromedial basilar right lower lobe, largest 5.3 mm in volume derived mean diameter, most likely nonspecific mild infectious or inflammatory bronchiolitis. Plan was for a 6 month follow up  Abnormal Screening scan 03/30/2022>> LR 4A>> New focal occlusion of the left upper lobe apicoposterior segmental bronchus with new mild patchy tree-in-bud type opacity throughout the posterior left upper lobe. Occlusive endobronchial lesion cannot be excluded, although the differential includes mucus plugging. Waxing and waning mild patchy tree-in-bud opacities in the posterior lower lobes bilaterally compatible with nonspecific bronchiolitis such as due to recurrent mild aspiration.   Assessment and Plan: Abnormal Screening CT Chest New focal occlusion of the left upper lobe apicoposterior segmental bronchus Occlusive endobronchial lesion cannot be excluded, although the differential includes mucus plugging. Plan Take Mucinex 1200 mg daily with a full glass of water  Use Flutter valve 6 times daily, 4 blows each time.  The flutter valve is at the front desk at [employer]  Please pick this up today and start using. You will receive instruction on use when you pick this up.  Follow up LDCT in 6 weeks to re-evaluate  Continued to Vape tobacco products Plan Work on quitting vaping any tobacco products.   Follow Up Instructions: Follow up CT chest in 6 weeks to re-evaluate    I discussed the assessment and treatment  plan with the patient. The patient was provided an opportunity to ask questions and all were answered. The patient agreed with the plan and demonstrated an understanding of the instructions.   The patient was advised to call back or seek an in-person evaluation if the symptoms worsen  or if the condition fails to improve as anticipated.  I provided 25 minutes of non-face-to-face time during this encounter.   Magdalen Spatz, NP  04/01/2022

## 2022-04-01 NOTE — Patient Instructions (Addendum)
It was good to speak with you today We discussed that your abnormal CT Chest could be mucoid impaction or possibly a cancer  Take Mucinex 1200 mg daily with a full glass of water  Use Flutter valve 6 times daily, 4 blows each time.  The flutter valve is at the front desk at [employer]  Please pick this up today and start using. You will receive instruction on use when you pick this up.  Follow up LDCT in 6 weeks to re-evaluate We will call you to get this scheduled Work on quitting vaping any tobacco products.  Please contact office for sooner follow up if symptoms do not improve or worsen or seek emergency care

## 2022-05-12 ENCOUNTER — Ambulatory Visit
Admission: RE | Admit: 2022-05-12 | Discharge: 2022-05-12 | Disposition: A | Discharge status: Home / Self Care | Payer: Medicare Other | Source: Ambulatory Visit | Attending: Critical Care Medicine | Admitting: Critical Care Medicine

## 2022-05-12 ENCOUNTER — Other Ambulatory Visit: Payer: Self-pay | Admitting: *Deleted

## 2022-05-12 DIAGNOSIS — T17500A Unspecified foreign body in bronchus causing asphyxiation, initial encounter: Secondary | ICD-10-CM

## 2022-05-12 DIAGNOSIS — R911 Solitary pulmonary nodule: Secondary | ICD-10-CM

## 2022-05-12 MED ORDER — IOPAMIDOL (ISOVUE-370) INJECTION 76%
75.0000 mL | Freq: Once | INTRAVENOUS | Status: AC | PRN
  Administered 2022-05-12: 75 mL via INTRAVENOUS

## 2022-05-13 ENCOUNTER — Telehealth: Payer: Self-pay | Admitting: Acute Care

## 2022-05-13 NOTE — Telephone Encounter (Signed)
Call report. [facility]. Pls call.

## 2022-05-13 NOTE — Telephone Encounter (Signed)
Received call report from [facility] with [facility] on patient's CT Chest done on 05/12/22. Sarah, please review the result/impression copied below:  IMPRESSION: 1. Left upper lobe perihilar mass measuring up to 3.3 cm, highly concerning for primary lung malignancy. Lung-RADS 4X, highly suspicious. Additional imaging evaluation or consultation with Pulmonology or Thoracic Surgery recommended. 2. Enlarged AP window lymph node, concerning for nodal metastatic disease. 3. New lytic lesion of the T8 vertebral body, highly concerning for metastatic disease. Possible involvement of the left foramina. This could be further evaluated with contrast MRI of the thoracic spine. 4. Numerous small solid centrilobular nodules of the left upper lobe, likely postobstructive change. 5. Mild bibasilar linear and nodular opacities, including a new solid pulmonary nodule of the right lower lobe measuring 4 mm. Findings are likely secondary to aspiration or infection. Recommend attention on follow-up. 6. Aortic Atherosclerosis (ICD10-I70.0) and Emphysema (ICD10-J43.9).  Please advise, thank you.

## 2022-05-13 NOTE — Telephone Encounter (Signed)
I have attempted to call the patient with the results of their  Low Dose CT Chest Lung cancer screening scan. There was no answer. I have left a HIPPA compliant VM requesting the patient call the office for the scan results. I included the office contact information in the message. We will await his return call. If no return call we will continue to call until patient is contacted.    Langley Gauss, [place], we will need order a PET once I get in touch with him, and will need to get him either on [facility] or [place]'s schedule also once we get in touch with him.  Thanks

## 2022-05-13 NOTE — Telephone Encounter (Signed)
See other telephone note from 05/13/22

## 2022-05-14 ENCOUNTER — Telehealth: Payer: Self-pay | Admitting: Acute Care

## 2022-05-14 ENCOUNTER — Other Ambulatory Visit: Payer: Self-pay | Admitting: Acute Care

## 2022-05-14 NOTE — Telephone Encounter (Signed)
I have called the patient with the results of the CT Chest. This is a 6 week repeat CT chest after a lung cancer screening 03/30/2022 which was read as a lung RADS 4A.  At that time there was a new focal occlusion of the left upper lobe at the Co. posterior segmental bronchus with new mild patchy tree-in-bud type opacity throughout the posterior left upper lobe.  Per radiology and occlusive endobronchial lesion could not be excluded although the differential includes mucous plugging. Patient had a video visit with pulmonary 04/01/2022.  He was treated with mucolytic's and flutter valve x 6 weeks and the CT was repeated.  Upon repeat of the CT scan there was a left upper lobe perihilar mass measuring 3.3 cm that was highly concerning for primary lung malignancy.  Additionally there was an enlarged AP window lymph node that is concerning for nodal metastatic disease and a new lytic lesion of the T8 vertebral body highly concerning for metastatic disease.  There are numerous small solid centrilobular nodules of the left upper lobe likely postobstructive change.  Additionally there continue to be mild bibasilar linear and nodular opacities including a new solid pulmonary nodule in the right lower lobe that measures 4 mm.  These are most likely related to a secondary aspiration or infection.  I called the patient with the most recent CT results.  I explained that the neck step needs to be follow-up CT PET scan to better evaluate these areas for potential malignancy. He verbalized understanding and is in agreement with the above.  He has an appointment in the pulmonary office tomorrow for 05/15/2022 with me.  Dr. Valeta Harms is in the office and I will ask Dr. Valeta Harms to see if he could potentially come in and talk to the patient briefly about need for biopsy. Patient is in agreement with this plan. Langley Gauss I will order a PET scan please schedule patient for a video visit to review the results of the PET scan once it has been  done. I will work with Dr. Valeta Harms to see if he can come and talk with the patient briefly while the patient is in the clinic with me tomorrow. Please fax results to patient's PCP.  Let them know plan of care is for PET scan and follow-up with Dr. Valeta Harms regarding biopsy Thanks so much

## 2022-05-15 ENCOUNTER — Encounter: Payer: Self-pay | Admitting: Pulmonary Disease

## 2022-05-15 ENCOUNTER — Encounter: Payer: Self-pay | Admitting: Acute Care

## 2022-05-15 ENCOUNTER — Ambulatory Visit (INDEPENDENT_AMBULATORY_CARE_PROVIDER_SITE_OTHER): Payer: Medicare Other | Admitting: Acute Care

## 2022-05-15 VITALS — BP 128/64 | HR 57 | Ht 69.0 in | Wt 175.4 lb

## 2022-05-15 DIAGNOSIS — R911 Solitary pulmonary nodule: Secondary | ICD-10-CM | POA: Insufficient documentation

## 2022-05-15 DIAGNOSIS — Z87891 Personal history of nicotine dependence: Secondary | ICD-10-CM

## 2022-05-15 HISTORY — DX: Solitary pulmonary nodule: R91.1

## 2022-05-15 NOTE — Patient Instructions (Addendum)
It is good to see you today. The CT Chest done on 05/12/2022 has an area of concern in the left upper lobe. Per your conversation with Dr. Tonia Brooms, we will schedule you for a bronchoscopy on 4/16. Please make sure you have a driver to bring you and take you home.  You will have a follow up visit in the clinic 1 week after the procedure I have also ordered a PET scan . You will get a call to get this scheduled.  Please call us if you have any questions at all.  Please contact office for sooner follow up if symptoms do not improve or worsen or seek emergency care

## 2022-05-15 NOTE — Progress Notes (Signed)
History of Present Illness Albert CouncilFloyd Balthazar Jr. is a 67 y.o. male former smoker with a 30 pack year smoking history,quit 2013, with  an abnormal  Screening CT Chest . He is followed by [facility].    05/15/2022 Pt. Presents for follow up after abnormal lung cancer screening , treatment for suspected infection , and repeat CT Chest with contrast to re-evaluate area of concern. Repeat CT Chest was read as a LR 4X. Left upper lobe perihilar mass measuring up to 3.3 cm, highly concerning for primary lung malignancy. I saw the patient today jointly with Dr. Tonia BroomsIcard. We discussed that best option moving forward is for a bronchoscopy with biopsies of the nodule of concern. Dr. Tonia BroomsIcard reviewed the procedure with the patient and answered all of his questions. I reviewed risks of procedure  to include infection, bleeding, pneumothorax.  Plan is for procedure on 05/26/2022. Pt. Is not on blood thinners .  We will also order a PET scan to be done for staging purposes and to better evaluate the notation on the PET scan of  New lytic lesion of the T8 vertebral body. Pt. Is in agreement with the above plan. Patient has never had general anesthesia before.   Test Results: Abnormal screening scan 09/23/2021>> LR 3>> New clustered indistinct peribronchovascular nodularity in the posteromedial basilar right lower lobe, largest 5.3 mm in volume derived mean diameter, most likely nonspecific mild infectious or inflammatory bronchiolitis. Plan was for a 6 month follow up  Abnormal Screening scan 03/30/2022>> LR 4A>> New focal occlusion of the left upper lobe apicoposterior segmental bronchus with new mild patchy tree-in-bud type opacity throughout the posterior left upper lobe. Occlusive endobronchial lesion cannot be excluded, although the differential includes mucus plugging. Waxing and waning mild patchy tree-in-bud opacities in the posterior lower lobes bilaterally compatible with nonspecific  bronchiolitis such as due to recurrent mild aspiration. Treated with Mucinex and Flutter valve >> Repeat CT Chest 6 weeks  Repeat CT Chest without contrast 05/12/2022  Left upper lobe perihilar mass measuring up to 3.3 cm, highly concerning for primary lung malignancy. Lung-RADS 4X, highly suspicious. Additional imaging evaluation or consultation with Pulmonology or Thoracic Surgery recommended. 2. Enlarged AP window lymph node, concerning for nodal metastatic disease. 3. New lytic lesion of the T8 vertebral body, highly concerning for metastatic disease. Possible involvement of the left foramina. This could be further evaluated with contrast MRI of the thoracic spine. 4. Numerous small solid centrilobular nodules of the left upper lobe, likely postobstructive change. 5. Mild bibasilar linear and nodular opacities, including a new solid pulmonary nodule of the right lower lobe measuring 4 mm. Findings are likely secondary to aspiration or infection. Recommend attention on follow-up. 6. Aortic Atherosclerosis (ICD10-I70.0) and Emphysema (ICD10-J43.9).     Latest Ref Rng & Units 08/27/2014    3:11 PM 12/22/2011    4:45 AM 12/21/2011    6:56 AM  CBC  WBC 4.0 - 10.5 K/uL 7.0  7.3  5.2   Hemoglobin 13.0 - 17.0 g/dL 16.112.1  09.613.1  04.513.9   Hematocrit 39.0 - 52.0 % 36.9  38.9  40.1   Platelets 150 - 400 K/uL 239  164  165        Latest Ref Rng & Units 10/21/2021   10:38 AM 06/12/2021    2:23 PM 04/29/2020    2:20 PM  BMP  Glucose 70 - 99 mg/dL 95  93  98   BUN 8 - 27 mg/dL 19  15  15   Creatinine 0.76 - 1.27 mg/dL 4.23  5.36  1.44   BUN/Creat Ratio 10 - 24 16   13    Sodium 134 - 144 mmol/L 145  141  139   Potassium 3.5 - 5.2 mmol/L 4.5  4.1  5.3   Chloride 96 - 106 mmol/L 106  102  101   CO2 20 - 29 mmol/L 23  31  25    Calcium 8.6 - 10.2 mg/dL 9.4  9.8  9.5     BNP No results found for: "BNP"  ProBNP    Component Value Date/Time   PROBNP 56 03/03/2019 1427    PFT    Component  Value Date/Time   FEV1PRE 3.20 02/19/2020 0857   FEV1POST 3.22 02/19/2020 0857   FVCPRE 4.15 02/19/2020 0857   FVCPOST 4.17 02/19/2020 0857   TLC 6.33 02/19/2020 0857   DLCOUNC 24.45 02/19/2020 0857   PREFEV1FVCRT 77 02/19/2020 0857   PSTFEV1FVCRT 77 02/19/2020 0857    CT Chest W Contrast  Result Date: 05/12/2022 CLINICAL DATA:  Follow-up left upper lobe bronchial occlusion; * Tracking Code: BO * EXAM: CT CHEST WITH CONTRAST TECHNIQUE: Multidetector CT imaging of the chest was performed during intravenous contrast administration. RADIATION DOSE REDUCTION: This exam was performed according to the departmental dose-optimization program which includes automated exposure control, adjustment of the mA and/or kV according to patient size and/or use of iterative reconstruction technique. CONTRAST:  40mL ISOVUE-370 IOPAMIDOL (ISOVUE-370) INJECTION 76% COMPARISON:  CT dated March 30, 2022 FINDINGS: Cardiovascular: Normal heart size. No pericardial effusion. Normal caliber thoracic aorta with mild calcified plaque. No suspicious filling defects of the central pulmonary arteries. Mediastinum/Nodes: Esophagus and thyroid are unremarkable. Enlarged AP window lymph node measuring 1.2 cm on series 2, image 87. Lungs/Pleura: Left upper lobe perihilar mass measuring 1.7 x 3.3 cm on series 2, image 70. Secondary occlusion of the left upper lobe bronchus. Numerous small centrilobular nodules of the left upper lobe, likely postobstructive change. Mild paraseptal and centrilobular emphysema. Mild bibasilar linear and nodular opacities, including a new solid pulmonary nodule of the right lower lobe measuring 4 mm on series 3, image 131, likely secondary to aspiration or infection. No pleural effusion or pneumothorax. Upper Abdomen: No acute abnormality. Musculoskeletal: New lytic lesion of the T8 vertebral body with possible involvement of the left foramina. IMPRESSION: 1. Left upper lobe perihilar mass measuring up to 3.3  cm, highly concerning for primary lung malignancy. Lung-RADS 4X, highly suspicious. Additional imaging evaluation or consultation with Pulmonology or Thoracic Surgery recommended. 2. Enlarged AP window lymph node, concerning for nodal metastatic disease. 3. New lytic lesion of the T8 vertebral body, highly concerning for metastatic disease. Possible involvement of the left foramina. This could be further evaluated with contrast MRI of the thoracic spine. 4. Numerous small solid centrilobular nodules of the left upper lobe, likely postobstructive change. 5. Mild bibasilar linear and nodular opacities, including a new solid pulmonary nodule of the right lower lobe measuring 4 mm. Findings are likely secondary to aspiration or infection. Recommend attention on follow-up. 6. Aortic Atherosclerosis (ICD10-I70.0) and Emphysema (ICD10-J43.9). These results will be called to the ordering clinician or representative by the Radiologist Assistant, and communication documented in the PACS or [employer]. Electronically Signed   By: Allegra Lai M.D.   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    Drug use: Not Currently    Frequency: 1.0 times per week    Types: Marijuana    Albert Hardy reports that he quit smoking about 10 years ago. His smoking use included cigarettes. He has a 30.00 pack-year smoking history. He has never used smokeless tobacco. He reports current alcohol use. He reports that he does not currently use drugs after having used the following drugs: Marijuana. Frequency: 1.00 time per week.  Tobacco Cessation: Former smoker , Quit 2014 with a 30 pack year smoking history   Past surgical hx, Family hx, Social hx all reviewed.  Current Outpatient Medications on File Prior to Visit  Medication Sig   albuterol (VENTOLIN HFA) 108 (90 Base) MCG/ACT inhaler Inhale 2 puffs into the lungs every 6 (six) hours as needed for wheezing or shortness of breath.   aspirin 81 MG tablet Take 81 mg by mouth daily.   Cholecalciferol (VITAMIN D3) 1000 UNITS CAPS Take 1 capsule by mouth daily.   Coenzyme Q10 (CO Q 10 PO) Take 1 tablet by mouth daily.   Cyanocobalamin (VITAMIN B-12 PO) Take 1 tablet by mouth daily.   ezetimibe (ZETIA) 10 MG tablet Take 1 tablet (10 mg total) by mouth daily.   fluticasone (FLONASE) 50 MCG/ACT nasal spray Place 1-2 sprays into both nostrils daily.   KRILL OIL PO Take 1 tablet by mouth daily.   lisinopril-hydrochlorothiazide (ZESTORETIC) 20-12.5 MG tablet Take 2 tablets by mouth once daily   MAGNESIUM PO Take 1 tablet by mouth daily.   Multiple Vitamins-Minerals (CENTRUM SILVER PO) Take 1 tablet by mouth daily.   Multiple Vitamins-Minerals (ZINC PO) Take 1 tablet by mouth daily.   nicotine (NICODERM CQ - DOSED IN MG/24 HOURS) 14 mg/24hr patch 14mg /day for 6 weeks then 7mg /day x 2 weeks   omeprazole (PRILOSEC) 20 MG capsule  Take 1 capsule by mouth once daily   rosuvastatin (CRESTOR) 5 MG tablet TAKE 1 TABLET BY MOUTH AT BEDTIME   TURMERIC PO Take 1 tablet by mouth daily.    umeclidinium-vilanterol (ANORO ELLIPTA) 62.5-25 MCG/ACT AEPB Inhale 1 puff into the lungs daily.   metoprolol tartrate (LOPRESSOR) 25 MG tablet Take 1 tablet (25 mg total) by mouth once for 1 dose. Please take 2 hours before CT   No current facility-administered medications on file prior to visit.     Allergies  Allergen Reactions   Lipitor [Atorvastatin]  myalgia   Pravastatin Other (See Comments)    myalgia    Review Of Systems:  Constitutional:   No  weight loss, night sweats,  Fevers, chills, fatigue, or  lassitude.  HEENT:   No headaches,  Difficulty swallowing,  Tooth/dental problems, or  Sore throat,                No sneezing, itching, ear ache, nasal congestion, post nasal drip,   CV:  No chest pain,  Orthopnea, PND, swelling in lower extremities, anasarca, dizziness, palpitations, syncope.   GI  No heartburn, indigestion, abdominal pain, nausea, vomiting, diarrhea, change in bowel habits, loss of appetite, bloody stools.   Resp: No shortness of breath with exertion or at rest.  No excess mucus, no productive cough,  No non-productive cough,  No coughing up of blood.  No change in color of mucus.  No wheezing.  No chest wall deformity  Skin: no rash or lesions.  GU: no dysuria, change in color of urine, no urgency or frequency.  No flank pain, no hematuria   MS:  No joint pain or swelling.  No decreased range of motion.  No back pain.  Psych:  No change in mood or affect. No depression or anxiety.  No memory loss.   Vital Signs BP 128/64 (BP Location: Left Arm, Patient Position: Sitting, Cuff Size: Normal)   Pulse (!) 57   Ht 5\' 9"  (1.753 m)   Wt 175 lb 6.4 oz (79.6 kg)   SpO2 97%   BMI 25.90 kg/m    Physical Exam:  General- No distress,  A&Ox3, pleasant and appropriately concerned ENT: No sinus  tenderness, TM clear, pale nasal mucosa, no oral exudate,no post nasal drip, no LAN Cardiac: S1, S2, regular rate and rhythm, no murmur Chest: No wheeze/ rales/ dullness; no accessory muscle use, no nasal flaring, no sternal retractions Abd.: Soft Non-tender Ext: No clubbing cyanosis, edema Neuro:  normal strength Skin: No rashes, warm and dry Psych: normal mood and behavior   Assessment/Plan Left upper lobe perihilar mass measuring up to 3.3 cm, highly concerning for primary lung malignancy.  Enlarged AP window lymph node, concerning for nodal metastatic disease. New lytic lesion of the T8 vertebral body, highly concerning for metastatic disease.  Plan he CT Chest done on 05/12/2022 has an area of concern in the left upper lobe. Per your conversation with Dr. Tonia BroomsIcard, we will schedule you for a bronchoscopy on 4/16. Please make sure you have a driver to bring you and take you home.  You will have a follow up visit in the clinic 1 week after the procedure I have also ordered a PET scan . You will get a call to get this scheduled.  Please call us if you have any questions at all.  Please contact office for sooner follow up if symptoms do not improve or worsen or seek emergency care    I spent 35  minutes dedicated to the care of this patient on the date of this encounter to include pre-visit review of records, face-to-face time with the patient discussing conditions above, post visit ordering of testing, clinical documentation with the electronic health record, making appropriate referrals as documented, and communicating necessary information to the patient's healthcare team.   Bevelyn NgoSarah F Lene Mckay, NP 05/15/2022  10:36 AM

## 2022-05-15 NOTE — H&P (View-Only) (Signed)
History of Present Illness Albert CouncilFloyd Balthazar Jr. is a 67 y.o. male former smoker with a 30 pack year smoking history,quit 2013, with  an abnormal  Screening CT Chest . He is followed by the [facility].    05/15/2022 Pt. Presents for follow up after abnormal lung cancer screening , treatment for suspected infection , and repeat CT Chest with contrast to re-evaluate area of concern. Repeat CT Chest was read as a LR 4X. Left upper lobe perihilar mass measuring up to 3.3 cm, highly concerning for primary lung malignancy. I saw the patient today jointly with Dr. Tonia BroomsIcard. We discussed that best option moving forward is for a bronchoscopy with biopsies of the nodule of concern. Dr. Tonia BroomsIcard reviewed the procedure with the patient and answered all of his questions. I reviewed risks of procedure  to include infection, bleeding, pneumothorax.  Plan is for procedure on 05/26/2022. Pt. Is not on blood thinners .  We will also order a PET scan to be done for staging purposes and to better evaluate the notation on the PET scan of  New lytic lesion of the T8 vertebral body. Pt. Is in agreement with the above plan. Patient has never had general anesthesia before.   Test Results: Abnormal screening scan 09/23/2021>> LR 3>> New clustered indistinct peribronchovascular nodularity in the posteromedial basilar right lower lobe, largest 5.3 mm in volume derived mean diameter, most likely nonspecific mild infectious or inflammatory bronchiolitis. Plan was for a 6 month follow up  Abnormal Screening scan 03/30/2022>> LR 4A>> New focal occlusion of the left upper lobe apicoposterior segmental bronchus with new mild patchy tree-in-bud type opacity throughout the posterior left upper lobe. Occlusive endobronchial lesion cannot be excluded, although the differential includes mucus plugging. Waxing and waning mild patchy tree-in-bud opacities in the posterior lower lobes bilaterally compatible with nonspecific  bronchiolitis such as due to recurrent mild aspiration. Treated with Mucinex and Flutter valve >> Repeat CT Chest 6 weeks  Repeat CT Chest without contrast 05/12/2022  Left upper lobe perihilar mass measuring up to 3.3 cm, highly concerning for primary lung malignancy. Lung-RADS 4X, highly suspicious. Additional imaging evaluation or consultation with Pulmonology or Thoracic Surgery recommended. 2. Enlarged AP window lymph node, concerning for nodal metastatic disease. 3. New lytic lesion of the T8 vertebral body, highly concerning for metastatic disease. Possible involvement of the left foramina. This could be further evaluated with contrast MRI of the thoracic spine. 4. Numerous small solid centrilobular nodules of the left upper lobe, likely postobstructive change. 5. Mild bibasilar linear and nodular opacities, including a new solid pulmonary nodule of the right lower lobe measuring 4 mm. Findings are likely secondary to aspiration or infection. Recommend attention on follow-up. 6. Aortic Atherosclerosis (ICD10-I70.0) and Emphysema (ICD10-J43.9).     Latest Ref Rng & Units 08/27/2014    3:11 PM 12/22/2011    4:45 AM 12/21/2011    6:56 AM  CBC  WBC 4.0 - 10.5 K/uL 7.0  7.3  5.2   Hemoglobin 13.0 - 17.0 g/dL 16.112.1  09.613.1  04.513.9   Hematocrit 39.0 - 52.0 % 36.9  38.9  40.1   Platelets 150 - 400 K/uL 239  164  165        Latest Ref Rng & Units 10/21/2021   10:38 AM 06/12/2021    2:23 PM 04/29/2020    2:20 PM  BMP  Glucose 70 - 99 mg/dL 95  93  98   BUN 8 - 27 mg/dL 19  15  15   Creatinine 0.76 - 1.27 mg/dL 4.23  5.36  1.44   BUN/Creat Ratio 10 - 24 16   13    Sodium 134 - 144 mmol/L 145  141  139   Potassium 3.5 - 5.2 mmol/L 4.5  4.1  5.3   Chloride 96 - 106 mmol/L 106  102  101   CO2 20 - 29 mmol/L 23  31  25    Calcium 8.6 - 10.2 mg/dL 9.4  9.8  9.5     BNP No results found for: "BNP"  ProBNP    Component Value Date/Time   PROBNP 56 03/03/2019 1427    PFT    Component  Value Date/Time   FEV1PRE 3.20 02/19/2020 0857   FEV1POST 3.22 02/19/2020 0857   FVCPRE 4.15 02/19/2020 0857   FVCPOST 4.17 02/19/2020 0857   TLC 6.33 02/19/2020 0857   DLCOUNC 24.45 02/19/2020 0857   PREFEV1FVCRT 77 02/19/2020 0857   PSTFEV1FVCRT 77 02/19/2020 0857    CT Chest W Contrast  Result Date: 05/12/2022 CLINICAL DATA:  Follow-up left upper lobe bronchial occlusion; * Tracking Code: BO * EXAM: CT CHEST WITH CONTRAST TECHNIQUE: Multidetector CT imaging of the chest was performed during intravenous contrast administration. RADIATION DOSE REDUCTION: This exam was performed according to the departmental dose-optimization program which includes automated exposure control, adjustment of the mA and/or kV according to patient size and/or use of iterative reconstruction technique. CONTRAST:  40mL ISOVUE-370 IOPAMIDOL (ISOVUE-370) INJECTION 76% COMPARISON:  CT dated March 30, 2022 FINDINGS: Cardiovascular: Normal heart size. No pericardial effusion. Normal caliber thoracic aorta with mild calcified plaque. No suspicious filling defects of the central pulmonary arteries. Mediastinum/Nodes: Esophagus and thyroid are unremarkable. Enlarged AP window lymph node measuring 1.2 cm on series 2, image 87. Lungs/Pleura: Left upper lobe perihilar mass measuring 1.7 x 3.3 cm on series 2, image 70. Secondary occlusion of the left upper lobe bronchus. Numerous small centrilobular nodules of the left upper lobe, likely postobstructive change. Mild paraseptal and centrilobular emphysema. Mild bibasilar linear and nodular opacities, including a new solid pulmonary nodule of the right lower lobe measuring 4 mm on series 3, image 131, likely secondary to aspiration or infection. No pleural effusion or pneumothorax. Upper Abdomen: No acute abnormality. Musculoskeletal: New lytic lesion of the T8 vertebral body with possible involvement of the left foramina. IMPRESSION: 1. Left upper lobe perihilar mass measuring up to 3.3  cm, highly concerning for primary lung malignancy. Lung-RADS 4X, highly suspicious. Additional imaging evaluation or consultation with Pulmonology or Thoracic Surgery recommended. 2. Enlarged AP window lymph node, concerning for nodal metastatic disease. 3. New lytic lesion of the T8 vertebral body, highly concerning for metastatic disease. Possible involvement of the left foramina. This could be further evaluated with contrast MRI of the thoracic spine. 4. Numerous small solid centrilobular nodules of the left upper lobe, likely postobstructive change. 5. Mild bibasilar linear and nodular opacities, including a new solid pulmonary nodule of the right lower lobe measuring 4 mm. Findings are likely secondary to aspiration or infection. Recommend attention on follow-up. 6. Aortic Atherosclerosis (ICD10-I70.0) and Emphysema (ICD10-J43.9). These results will be called to the ordering clinician or representative by the Radiologist Assistant, and communication documented in the PACS or [employer]. Electronically Signed   By: Allegra Lai M.D.   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    Drug use: Not Currently    Frequency: 1.0 times per week    Types: Marijuana    Mr.Traister reports that he quit smoking about 10 years ago. His smoking use included cigarettes. He has a 30.00 pack-year smoking history. He has never used smokeless tobacco. He reports current alcohol use. He reports that he does not currently use drugs after having used the following drugs: Marijuana. Frequency: 1.00 time per week.  Tobacco Cessation: Former smoker , Quit 2014 with a 30 pack year smoking history   Past surgical hx, Family hx, Social hx all reviewed.  Current Outpatient Medications on File Prior to Visit  Medication Sig   albuterol (VENTOLIN HFA) 108 (90 Base) MCG/ACT inhaler Inhale 2 puffs into the lungs every 6 (six) hours as needed for wheezing or shortness of breath.   aspirin 81 MG tablet Take 81 mg by mouth daily.   Cholecalciferol (VITAMIN D3) 1000 UNITS CAPS Take 1 capsule by mouth daily.   Coenzyme Q10 (CO Q 10 PO) Take 1 tablet by mouth daily.   Cyanocobalamin (VITAMIN B-12 PO) Take 1 tablet by mouth daily.   ezetimibe (ZETIA) 10 MG tablet Take 1 tablet (10 mg total) by mouth daily.   fluticasone (FLONASE) 50 MCG/ACT nasal spray Place 1-2 sprays into both nostrils daily.   KRILL OIL PO Take 1 tablet by mouth daily.   lisinopril-hydrochlorothiazide (ZESTORETIC) 20-12.5 MG tablet Take 2 tablets by mouth once daily   MAGNESIUM PO Take 1 tablet by mouth daily.   Multiple Vitamins-Minerals (CENTRUM SILVER PO) Take 1 tablet by mouth daily.   Multiple Vitamins-Minerals (ZINC PO) Take 1 tablet by mouth daily.   nicotine (NICODERM CQ - DOSED IN MG/24 HOURS) 14 mg/24hr patch 14mg /day for 6 weeks then 7mg /day x 2 weeks   omeprazole (PRILOSEC) 20 MG capsule  Take 1 capsule by mouth once daily   rosuvastatin (CRESTOR) 5 MG tablet TAKE 1 TABLET BY MOUTH AT BEDTIME   TURMERIC PO Take 1 tablet by mouth daily.    umeclidinium-vilanterol (ANORO ELLIPTA) 62.5-25 MCG/ACT AEPB Inhale 1 puff into the lungs daily.   metoprolol tartrate (LOPRESSOR) 25 MG tablet Take 1 tablet (25 mg total) by mouth once for 1 dose. Please take 2 hours before CT   No current facility-administered medications on file prior to visit.     Allergies  Allergen Reactions   Lipitor [Atorvastatin]  myalgia   Pravastatin Other (See Comments)    myalgia    Review Of Systems:  Constitutional:   No  weight loss, night sweats,  Fevers, chills, fatigue, or  lassitude.  HEENT:   No headaches,  Difficulty swallowing,  Tooth/dental problems, or  Sore throat,                No sneezing, itching, ear ache, nasal congestion, post nasal drip,   CV:  No chest pain,  Orthopnea, PND, swelling in lower extremities, anasarca, dizziness, palpitations, syncope.   GI  No heartburn, indigestion, abdominal pain, nausea, vomiting, diarrhea, change in bowel habits, loss of appetite, bloody stools.   Resp: No shortness of breath with exertion or at rest.  No excess mucus, no productive cough,  No non-productive cough,  No coughing up of blood.  No change in color of mucus.  No wheezing.  No chest wall deformity  Skin: no rash or lesions.  GU: no dysuria, change in color of urine, no urgency or frequency.  No flank pain, no hematuria   MS:  No joint pain or swelling.  No decreased range of motion.  No back pain.  Psych:  No change in mood or affect. No depression or anxiety.  No memory loss.   Vital Signs BP 128/64 (BP Location: Left Arm, Patient Position: Sitting, Cuff Size: Normal)   Pulse (!) 57   Ht 5\' 9"  (1.753 m)   Wt 175 lb 6.4 oz (79.6 kg)   SpO2 97%   BMI 25.90 kg/m    Physical Exam:  General- No distress,  A&Ox3, pleasant and appropriately concerned ENT: No sinus  tenderness, TM clear, pale nasal mucosa, no oral exudate,no post nasal drip, no LAN Cardiac: S1, S2, regular rate and rhythm, no murmur Chest: No wheeze/ rales/ dullness; no accessory muscle use, no nasal flaring, no sternal retractions Abd.: Soft Non-tender Ext: No clubbing cyanosis, edema Neuro:  normal strength Skin: No rashes, warm and dry Psych: normal mood and behavior   Assessment/Plan Left upper lobe perihilar mass measuring up to 3.3 cm, highly concerning for primary lung malignancy.  Enlarged AP window lymph node, concerning for nodal metastatic disease. New lytic lesion of the T8 vertebral body, highly concerning for metastatic disease.  Plan he CT Chest done on 05/12/2022 has an area of concern in the left upper lobe. Per your conversation with Dr. Tonia BroomsIcard, we will schedule you for a bronchoscopy on 4/16. Please make sure you have a driver to bring you and take you home.  You will have a follow up visit in the clinic 1 week after the procedure I have also ordered a PET scan . You will get a call to get this scheduled.  Please call us if you have any questions at all.  Please contact office for sooner follow up if symptoms do not improve or worsen or seek emergency care    I spent 35  minutes dedicated to the care of this patient on the date of this encounter to include pre-visit review of records, face-to-face time with the patient discussing conditions above, post visit ordering of testing, clinical documentation with the electronic health record, making appropriate referrals as documented, and communicating necessary information to the patient's healthcare team.   Bevelyn NgoSarah F Dioselina Brumbaugh, NP 05/15/2022  10:36 AM

## 2022-05-18 NOTE — Telephone Encounter (Signed)
CT results faxed to PCP with follow up plans included.  

## 2022-05-18 NOTE — Telephone Encounter (Signed)
See telephone note from 05/14/2022

## 2022-05-22 ENCOUNTER — Other Ambulatory Visit: Payer: Medicare Other

## 2022-05-22 DIAGNOSIS — R911 Solitary pulmonary nodule: Secondary | ICD-10-CM

## 2022-05-24 LAB — SPECIMEN STATUS REPORT

## 2022-05-24 LAB — NOVEL CORONAVIRUS, NAA: SARS-CoV-2, NAA: NOT DETECTED

## 2022-05-25 ENCOUNTER — Other Ambulatory Visit: Payer: Self-pay

## 2022-05-25 ENCOUNTER — Encounter (HOSPITAL_COMMUNITY): Payer: Self-pay | Admitting: Pulmonary Disease

## 2022-05-25 NOTE — Anesthesia Preprocedure Evaluation (Signed)
Anesthesia Evaluation  Patient identified by MRN, date of birth, ID band Patient awake    Reviewed: Allergy & Precautions, H&P , NPO status , Patient's Chart, lab work & pertinent test results  Airway Mallampati: II  TM Distance: >3 FB Neck ROM: Full    Dental no notable dental hx.    Pulmonary COPD, former smoker   Pulmonary exam normal breath sounds clear to auscultation       Cardiovascular hypertension, Pt. on medications + Peripheral Vascular Disease  Normal cardiovascular exam+ Valvular Problems/Murmurs AS  Rhythm:Regular Rate:Normal     Neuro/Psych negative neurological ROS  negative psych ROS   GI/Hepatic Neg liver ROS,GERD  ,,  Endo/Other  negative endocrine ROS    Renal/GU negative Renal ROS  negative genitourinary   Musculoskeletal negative musculoskeletal ROS (+)    Abdominal   Peds negative pediatric ROS (+)  Hematology negative hematology ROS (+)   Anesthesia Other Findings Lung mass  Reproductive/Obstetrics negative OB ROS                             Anesthesia Physical Anesthesia Plan  ASA: 3  Anesthesia Plan: General   Post-op Pain Management: Minimal or no pain anticipated   Induction: Intravenous  PONV Risk Score and Plan: 2 and Ondansetron, Midazolam and Treatment may vary due to age or medical condition  Airway Management Planned: Oral ETT  Additional Equipment:   Intra-op Plan:   Post-operative Plan: Extubation in OR  Informed Consent: I have reviewed the patients History and Physical, chart, labs and discussed the procedure including the risks, benefits and alternatives for the proposed anesthesia with the patient or authorized representative who has indicated his/her understanding and acceptance.     Dental advisory given  Plan Discussed with: CRNA  Anesthesia Plan Comments: (PAT note by Antionette Poles, PA-C:  Follows with cardiology for history  of HLD, mild aortic stenosis, frequent PVCs.  Last seen by Dr. Dulce Sellar 10/21/2021.  At that time it was noted he recently had evaluation for chest pain with cardiac CTA which was normal.  Patient denied any further chest pain.  He was recommended to remain on rosuvastatin and Zetia.  Echo showed no progression of aortic valvular disease with mild stenosis and mild regurgitation.  PVCs noted to be improved on metoprolol.  He was recommended to follow-up in 1 year.  Former smoker with associated COPD.  Maintained on an Anoro Ellipta.  Recently found to have left upper lobe perihilar mass 3.3 cm highly concerning for primary lung malignancy as well as enlarged AP window lymph node and new lytic lesion of the T8 vertebral body.  Patient will need day of surgery labs and evaluation.  EKG 06/12/2021: Sinus  Bradycardia  -Frequent pvcs -ventricular bigeminy.  Rate 58.  CT chest 05/12/2022: IMPRESSION: 1. Left upper lobe perihilar mass measuring up to 3.3 cm, highly concerning for primary lung malignancy. Lung-RADS 4X, highly suspicious. Additional imaging evaluation or consultation with Pulmonology or Thoracic Surgery recommended. 2. Enlarged AP window lymph node, concerning for nodal metastatic disease. 3. New lytic lesion of the T8 vertebral body, highly concerning for metastatic disease. Possible involvement of the left foramina. This could be further evaluated with contrast MRI of the thoracic spine. 4. Numerous small solid centrilobular nodules of the left upper lobe, likely postobstructive change. 5. Mild bibasilar linear and nodular opacities, including a new solid pulmonary nodule of the right lower lobe measuring 4 mm. Findings  are likely secondary to aspiration or infection. Recommend attention on follow-up. 6. Aortic Atherosclerosis (ICD10-I70.0) and Emphysema (ICD10-J43.9).  TTE 09/24/2021: 1. Left ventricular ejection fraction, by estimation, is 60 to 65%. The  left ventricle has normal  function. The left ventricle has no regional  wall motion abnormalities. Left ventricular diastolic parameters were  normal.  2. Right ventricular systolic function is normal. The right ventricular  size is normal.  3. Right atrial size was mildly dilated.  4. The mitral valve is normal in structure. No evidence of mitral valve  regurgitation. No evidence of mitral stenosis.  5. The aortic valve was not well visualized. Aortic valve regurgitation  is mild. Mild aortic valve stenosis.  6. The inferior vena cava is normal in size with greater than 50%  respiratory variability, suggesting right atrial pressure of 3 mmHg.   Coronary CTA 09/23/2021: IMPRESSION: 1. Coronary calcium score of 0. This was 0 percentile for age and sex matched control.  2. Normal coronary origin with right dominance.  3. CAD-RADS 0. No evidence of CAD (0%). Consider non-atherosclerotic causes of chest pain.  4. Mild calcifications of the aorta and aortic valve noted.  Event monitor 09/23/2021: Patch Wear Time:  7 days and 5 hours (2023-07-27T11:55:53-0400 to 2023-08-03T17:20:39-0400)  Patient had a min HR of 43 bpm, max HR of 160 bpm, and avg HR of 59 bpm. Predominant underlying rhythm was Sinus Rhythm.   There were no pauses of 3 seconds or greater and no episodes of second or third-degree AV nodal block.  There were 2 triggered events both of which were sinus rhythm without ectopy.  4 Supraventricular Tachycardia runs occurred, the run with the fastest interval lasting 5 beats with a max rate of 160 bpm, the longest lasting 8 beats with an avg rate of 100 bpm. Isolated SVEs were rare (<1.0%), SVE Couplets were rare (<1.0%), and SVE Triplets were rare (<1.0%).  There were no episodes of atrial fibrillation or flutter.  Isolated VEs were rare (<1.0%, 3105), VE Couplets were rare (<1.0%, 29), and VE Triplets were rare (<1.0%, 1). Ventricular Bigeminy and Trigeminy were present.    )         Anesthesia Quick Evaluation

## 2022-05-25 NOTE — Progress Notes (Signed)
SDW call  Patient was given pre-op instructions over the phone. Patient verbalized understanding of instructions provided.     PCP - Dr. Meredith Staggers Cardiologist - Dr. Norman Herrlich Pulmonary:    PPM/ICD - Denies   Chest x-ray - 10/25/2019 EKG -  06/12/2021 Stress Test -  ECHO - 09/24/2021 Cardiac Cath -   Sleep Study/sleep apnea/CPAP: Denies  Non-Diabetic   Blood Thinner Instructions: n/a Aspirin Instructions: States he rarely takes aspirin   ERAS Protcol - No, NPO PRE-SURGERY Ensure or G2- No   COVID TEST- Negative, 05/22/2022     Anesthesia review: Yes. HTN, Heart murmer, COPD, emphysema, PVD, aortic stenosis, aortic regurgitation   Patient denies shortness of breath, fever, cough and chest pain over the phone call    Your procedure is scheduled on Tuesday, May 26, 2022  Report to [facility] Main Entrance "A" at  0830  A.M., then check in with the Admitting office.  Call this number if you have problems the morning of surgery:  [phone]   If you have any questions prior to your surgery date call [phone]: Open Monday-Friday 8am-4pm If you experience any cold or flu symptoms such as cough, fever, chills, shortness of breath, etc. between now and your scheduled surgery, please notify us at the above number     Remember:  Do not eat or drink after midnight the night before your surgery  Take these medicines the morning of surgery with A SIP OF WATER:  Zetia, flonase, metoprolol, prilosec, anoro ellipta  As needed: Albuterol  As of today, STOP taking any Aspirin (unless otherwise instructed by your surgeon) Aleve, Naproxen, Ibuprofen, Motrin, Advil, Goody's, BC's, all herbal medications, fish oil, and all vitamins.

## 2022-05-25 NOTE — Progress Notes (Signed)
Anesthesia Chart Review: Same day workup  Follows with cardiology for history of HLD, mild aortic stenosis, frequent PVCs.  Last seen by Dr. Dulce Sellar 10/21/2021.  At that time it was noted he recently had evaluation for chest pain with cardiac CTA which was normal.  Patient denied any further chest pain.  He was recommended to remain on rosuvastatin and Zetia.  Echo showed no progression of aortic valvular disease with mild stenosis and mild regurgitation.  PVCs noted to be improved on metoprolol.  He was recommended to follow-up in 1 year.  Former smoker with associated COPD.  Maintained on an Anoro Ellipta.  Recently found to have left upper lobe perihilar mass 3.3 cm highly concerning for primary lung malignancy as well as enlarged AP window lymph node and new lytic lesion of the T8 vertebral body.  Patient will need day of surgery labs and evaluation.  EKG 06/12/2021: Sinus  Bradycardia  -Frequent pvcs -ventricular bigeminy.  Rate 58.  CT chest 05/12/2022: IMPRESSION: 1. Left upper lobe perihilar mass measuring up to 3.3 cm, highly concerning for primary lung malignancy. Lung-RADS 4X, highly suspicious. Additional imaging evaluation or consultation with Pulmonology or Thoracic Surgery recommended. 2. Enlarged AP window lymph node, concerning for nodal metastatic disease. 3. New lytic lesion of the T8 vertebral body, highly concerning for metastatic disease. Possible involvement of the left foramina. This could be further evaluated with contrast MRI of the thoracic spine. 4. Numerous small solid centrilobular nodules of the left upper lobe, likely postobstructive change. 5. Mild bibasilar linear and nodular opacities, including a new solid pulmonary nodule of the right lower lobe measuring 4 mm. Findings are likely secondary to aspiration or infection. Recommend attention on follow-up. 6. Aortic Atherosclerosis (ICD10-I70.0) and Emphysema (ICD10-J43.9).  TTE 09/24/2021: 1. Left ventricular  ejection fraction, by estimation, is 60 to 65%. The  left ventricle has normal function. The left ventricle has no regional  wall motion abnormalities. Left ventricular diastolic parameters were  normal.   2. Right ventricular systolic function is normal. The right ventricular  size is normal.   3. Right atrial size was mildly dilated.   4. The mitral valve is normal in structure. No evidence of mitral valve  regurgitation. No evidence of mitral stenosis.   5. The aortic valve was not well visualized. Aortic valve regurgitation  is mild. Mild aortic valve stenosis.   6. The inferior vena cava is normal in size with greater than 50%  respiratory variability, suggesting right atrial pressure of 3 mmHg.   Coronary CTA 09/23/2021: IMPRESSION: 1. Coronary calcium score of 0. This was 0 percentile for age and sex matched control.   2. Normal coronary origin with right dominance.   3. CAD-RADS 0. No evidence of CAD (0%). Consider non-atherosclerotic causes of chest pain.   4. Mild calcifications of the aorta and aortic valve noted.  Event monitor 09/23/2021: Patch Wear Time:  7 days and 5 hours (2023-07-27T11:55:53-0400 to 2023-08-03T17:20:39-0400)   Patient had a min HR of 43 bpm, max HR of 160 bpm, and avg HR of 59 bpm. Predominant underlying rhythm was Sinus Rhythm.    There were no pauses of 3 seconds or greater and no episodes of second or third-degree AV nodal block.   There were 2 triggered events both of which were sinus rhythm without ectopy.   4 Supraventricular Tachycardia runs occurred, the run with the fastest interval lasting 5 beats with a max rate of 160 bpm, the longest lasting 8 beats with an  avg rate of 100 bpm. Isolated SVEs were rare (<1.0%), SVE Couplets were rare (<1.0%), and SVE Triplets were rare (<1.0%).  There were no episodes of atrial fibrillation or flutter.   Isolated VEs were rare (<1.0%, 3105), VE Couplets were rare (<1.0%, 29), and VE Triplets were rare  (<1.0%, 1). Ventricular Bigeminy and Trigeminy were present.     Zannie Cove [facility] Short Stay Center/Anesthesiology Phone [phone] 05/25/2022 4:31 PM

## 2022-05-26 ENCOUNTER — Encounter (HOSPITAL_COMMUNITY)
Admission: RE | Disposition: A | Discharge status: Home / Self Care | Payer: Self-pay | Source: Ambulatory Visit | Attending: Pulmonary Disease

## 2022-05-26 ENCOUNTER — Other Ambulatory Visit: Payer: Self-pay

## 2022-05-26 ENCOUNTER — Ambulatory Visit (HOSPITAL_COMMUNITY): Payer: Medicare Other | Admitting: Physician Assistant

## 2022-05-26 ENCOUNTER — Ambulatory Visit (HOSPITAL_BASED_OUTPATIENT_CLINIC_OR_DEPARTMENT_OTHER): Payer: Medicare Other | Admitting: Physician Assistant

## 2022-05-26 ENCOUNTER — Encounter (HOSPITAL_COMMUNITY): Payer: Self-pay | Admitting: Pulmonary Disease

## 2022-05-26 ENCOUNTER — Ambulatory Visit (HOSPITAL_COMMUNITY)
Admission: RE | Admit: 2022-05-26 | Discharge: 2022-05-26 | Disposition: A | Discharge status: Home / Self Care | Payer: Medicare Other | Source: Ambulatory Visit | Attending: Pulmonary Disease | Admitting: Pulmonary Disease

## 2022-05-26 DIAGNOSIS — J449 Chronic obstructive pulmonary disease, unspecified: Secondary | ICD-10-CM | POA: Insufficient documentation

## 2022-05-26 DIAGNOSIS — C3412 Malignant neoplasm of upper lobe, left bronchus or lung: Secondary | ICD-10-CM

## 2022-05-26 DIAGNOSIS — I350 Nonrheumatic aortic (valve) stenosis: Secondary | ICD-10-CM | POA: Diagnosis not present

## 2022-05-26 DIAGNOSIS — K219 Gastro-esophageal reflux disease without esophagitis: Secondary | ICD-10-CM | POA: Insufficient documentation

## 2022-05-26 DIAGNOSIS — I739 Peripheral vascular disease, unspecified: Secondary | ICD-10-CM | POA: Insufficient documentation

## 2022-05-26 DIAGNOSIS — I10 Essential (primary) hypertension: Secondary | ICD-10-CM

## 2022-05-26 DIAGNOSIS — R911 Solitary pulmonary nodule: Secondary | ICD-10-CM | POA: Diagnosis present

## 2022-05-26 DIAGNOSIS — R918 Other nonspecific abnormal finding of lung field: Secondary | ICD-10-CM

## 2022-05-26 DIAGNOSIS — Z87891 Personal history of nicotine dependence: Secondary | ICD-10-CM | POA: Insufficient documentation

## 2022-05-26 HISTORY — PX: VIDEO BRONCHOSCOPY: SHX5072

## 2022-05-26 HISTORY — PX: BRONCHIAL BRUSHINGS: SHX5108

## 2022-05-26 HISTORY — DX: Dyspnea, unspecified: R06.00

## 2022-05-26 HISTORY — PX: BRONCHIAL BIOPSY: SHX5109

## 2022-05-26 LAB — POCT I-STAT, CHEM 8
BUN: 21 mg/dL (ref 8–23)
BUN: 33 mg/dL — ABNORMAL HIGH (ref 8–23)
Calcium, Ion: 1 mmol/L — ABNORMAL LOW (ref 1.15–1.40)
Calcium, Ion: 1.1 mmol/L — ABNORMAL LOW (ref 1.15–1.40)
Chloride: 104 mmol/L (ref 98–111)
Chloride: 106 mmol/L (ref 98–111)
Creatinine, Ser: 1.3 mg/dL — ABNORMAL HIGH (ref 0.61–1.24)
Creatinine, Ser: 1.3 mg/dL — ABNORMAL HIGH (ref 0.61–1.24)
Glucose, Bld: 112 mg/dL — ABNORMAL HIGH (ref 70–99)
Glucose, Bld: 112 mg/dL — ABNORMAL HIGH (ref 70–99)
HCT: 41 % (ref 39.0–52.0)
HCT: 46 % (ref 39.0–52.0)
Hemoglobin: 13.9 g/dL (ref 13.0–17.0)
Hemoglobin: 15.6 g/dL (ref 13.0–17.0)
Potassium: 3.4 mmol/L — ABNORMAL LOW (ref 3.5–5.1)
Potassium: 6.4 mmol/L (ref 3.5–5.1)
Sodium: 137 mmol/L (ref 135–145)
Sodium: 140 mmol/L (ref 135–145)
TCO2: 25 mmol/L (ref 22–32)
TCO2: 27 mmol/L (ref 22–32)

## 2022-05-26 SURGERY — BRONCHOSCOPY, WITH BRUSH BIOPSY
Anesthesia: General

## 2022-05-26 MED ORDER — CHLORHEXIDINE GLUCONATE 0.12 % MT SOLN
15.0000 mL | Freq: Once | OROMUCOSAL | Status: AC
  Filled 2022-05-26: qty 15

## 2022-05-26 MED ORDER — PROPOFOL 10 MG/ML IV BOLUS
INTRAVENOUS | Status: DC | PRN
  Administered 2022-05-26: 160 mg via INTRAVENOUS

## 2022-05-26 MED ORDER — LIDOCAINE 2% (20 MG/ML) 5 ML SYRINGE
INTRAMUSCULAR | Status: DC | PRN
  Administered 2022-05-26: 100 mg via INTRAVENOUS

## 2022-05-26 MED ORDER — LACTATED RINGERS IV SOLN: INTRAVENOUS | Status: DC

## 2022-05-26 MED ORDER — ONDANSETRON HCL 4 MG/2ML IJ SOLN
INTRAMUSCULAR | Status: DC | PRN
  Administered 2022-05-26: 4 mg via INTRAVENOUS

## 2022-05-26 MED ORDER — ROCURONIUM BROMIDE 10 MG/ML (PF) SYRINGE
PREFILLED_SYRINGE | INTRAVENOUS | Status: DC | PRN
  Administered 2022-05-26: 50 mg via INTRAVENOUS

## 2022-05-26 MED ORDER — CHLORHEXIDINE GLUCONATE 0.12 % MT SOLN
OROMUCOSAL | Status: AC
  Administered 2022-05-26: 15 mL via OROMUCOSAL
  Filled 2022-05-26: qty 15

## 2022-05-26 MED ORDER — PROPOFOL 500 MG/50ML IV EMUL
INTRAVENOUS | Status: DC | PRN
  Administered 2022-05-26: 125 ug/kg/min via INTRAVENOUS

## 2022-05-26 SURGICAL SUPPLY — 29 items
BRUSH CYTOL CELLEBRITY 1.5X140 (MISCELLANEOUS) IMPLANT
CANISTER SUCT 3000ML PPV (MISCELLANEOUS) ×4 IMPLANT
CONT SPEC 4OZ CLIKSEAL STRL BL (MISCELLANEOUS) ×4 IMPLANT
COVER BACK TABLE 60X90IN (DRAPES) ×4 IMPLANT
COVER DOME SNAP 22 D (MISCELLANEOUS) ×4 IMPLANT
FORCEPS BIOP RJ4 1.8 (CUTTING FORCEPS) IMPLANT
GAUZE SPONGE 4X4 12PLY STRL (GAUZE/BANDAGES/DRESSINGS) ×4 IMPLANT
GLOVE BIO SURGEON STRL SZ7.5 (GLOVE) ×4 IMPLANT
GOWN STRL REUS W/ TWL LRG LVL3 (GOWN DISPOSABLE) ×4 IMPLANT
GOWN STRL REUS W/TWL LRG LVL3 (GOWN DISPOSABLE) ×3
KIT CLEAN ENDO COMPLIANCE (KITS) ×8 IMPLANT
KIT TURNOVER KIT B (KITS) ×4 IMPLANT
MARKER SKIN DUAL TIP RULER LAB (MISCELLANEOUS) ×4 IMPLANT
NDL EBUS SONO TIP PENTAX (NEEDLE) ×2 IMPLANT
NEEDLE EBUS SONO TIP PENTAX (NEEDLE) ×3 IMPLANT
NS IRRIG 1000ML POUR BTL (IV SOLUTION) ×4 IMPLANT
OIL SILICONE PENTAX (PARTS (SERVICE/REPAIRS)) ×4 IMPLANT
PAD ARMBOARD 7.5X6 YLW CONV (MISCELLANEOUS) ×8 IMPLANT
SOL ANTI FOG 6CC (MISCELLANEOUS) ×4 IMPLANT
SYR 20CC LL (SYRINGE) ×8 IMPLANT
SYR 20ML ECCENTRIC (SYRINGE) ×8 IMPLANT
SYR 50ML SLIP (SYRINGE) IMPLANT
SYR 5ML LUER SLIP (SYRINGE) ×4 IMPLANT
TOWEL OR 17X24 6PK STRL BLUE (TOWEL DISPOSABLE) ×4 IMPLANT
TRAP SPECIMEN MUCOUS 40CC (MISCELLANEOUS) IMPLANT
TUBE CONNECTING 20X1/4 (TUBING) ×8 IMPLANT
UNDERPAD 30X30 (UNDERPADS AND DIAPERS) ×4 IMPLANT
VALVE DISPOSABLE (MISCELLANEOUS) ×4 IMPLANT
WATER STERILE IRR 1000ML POUR (IV SOLUTION) ×4 IMPLANT

## 2022-05-26 NOTE — Op Note (Signed)
Video Bronchoscopy Procedure Note  Date of Operation: 05/26/2022  Pre-op Diagnosis: Left upper lobe mass  Post-op Diagnosis: Left upper lobe mass  Surgeon: Josephine Igo, DO  Assistants: none  Anesthesia: conscious sedation, moderate sedation  Meds Given: General   Estimated Blood Loss: <1cc  Complications: none noted  Indications and History: Albert Mitrano. is 67 y.o. with history of left upper lobe mass.  Recommendation was to perform video fiberoptic bronchoscopy with biopsies. The risks, benefits, complications, treatment options and expected outcomes were discussed with the patient.  The possibilities of pneumothorax, pneumonia, reaction to medication, pulmonary aspiration, perforation of a viscus, bleeding, failure to diagnose a condition and creating a complication requiring transfusion or operation were discussed with the patient who freely signed the consent.    Description of Procedure: The patient was seen in the Preoperative Area, was examined and was deemed appropriate to proceed.  The patient was taken to [facility] endo 3, identified as Albert Hardy. and the procedure verified as Flexible Video Fiberoptic Bronchoscopy.  A Time Out was held and the above information confirmed.   Standard flexible bronchoscope was inserted through the patient's endotracheal tube and the bilateral airways were examined.  The right lung appears normal normal distal subsegments with no evidence of endobronchial lesion.  The left lung appears normal in the lower lobe, the left upper lobe appears totally occluded with compression externally and visible endobronchial tumor.  Using a cytology brush specimen was obtained from the left upper lobe.  We also used forceps to complete biopsies to the left upper lobe endobronchial lesion and occlusion in the left upper lobe.  Samples: 1.  Left upper lobe endobronchial brushing 2.  Left upper lobe forcep biopsies  Plans:  We will review the cytology,  pathology and results with the patient when they become available.  Outpatient followup will be with Dr. Nira Retort, DO [facility] 05/26/2022 10:51 AM

## 2022-05-26 NOTE — Anesthesia Postprocedure Evaluation (Signed)
Anesthesia Post Note  Patient: Albert Hardy.  Procedure(s) Performed: BRONCHIAL BRUSHINGS BRONCHIAL BIOPSIES VIDEO BRONCHOSCOPY WITHOUT FLUORO     Patient location during evaluation: PACU Anesthesia Type: General Level of consciousness: awake and alert Pain management: pain level controlled Vital Signs Assessment: post-procedure vital signs reviewed and stable Respiratory status: spontaneous breathing, nonlabored ventilation and respiratory function stable Cardiovascular status: blood pressure returned to baseline and stable Postop Assessment: no apparent nausea or vomiting Anesthetic complications: no   No notable events documented.  Last Vitals:  Vitals:   05/26/22 1120 05/26/22 1127  BP: (!) 154/77 (!) 147/75  Pulse: 62 75  Resp: 13 (!) 23  Temp:    SpO2: 97% 96%    Last Pain:  Vitals:   05/26/22 1127  TempSrc:   PainSc: 0-No pain                 Lowella Curb

## 2022-05-26 NOTE — Interval H&P Note (Signed)
History and Physical Interval Note:  05/26/2022 9:17 AM  Albert Hardy.  has presented today for surgery, with the diagnosis of lung nodule.  The various methods of treatment have been discussed with the patient and family. After consideration of risks, benefits and other options for treatment, the patient has consented to  Procedure(s): ROBOTIC ASSISTED NAVIGATIONAL BRONCHOSCOPY (Left) VIDEO BRONCHOSCOPY WITH ENDOBRONCHIAL ULTRASOUND (Bilateral) as a surgical intervention.  The patient's history has been reviewed, patient examined, no change in status, stable for surgery.  I have reviewed the patient's chart and labs.  Questions were answered to the patient's satisfaction.    Initial K was elevated, suspected to be hemolyzed. Patient to have blood redrawn before case. Discussed the anesthesia.    Rachel Bo Mailee Klaas

## 2022-05-26 NOTE — Anesthesia Procedure Notes (Signed)
Procedure Name: Intubation Date/Time: 05/26/2022 10:11 AM  Performed by: Margarita Rana, CRNAPre-anesthesia Checklist: Patient identified, Patient being monitored, Timeout performed, Emergency Drugs available and Suction available Patient Re-evaluated:Patient Re-evaluated prior to induction Oxygen Delivery Method: Circle System Utilized Preoxygenation: Pre-oxygenation with 100% oxygen Induction Type: IV induction Ventilation: Mask ventilation without difficulty Laryngoscope Size: Mac and 4 Grade View: Grade I Tube type: Oral Tube size: 8.5 mm Number of attempts: 1 Airway Equipment and Method: Stylet Placement Confirmation: ETT inserted through vocal cords under direct vision, positive ETCO2 and breath sounds checked- equal and bilateral Secured at: 21 cm Tube secured with: Tape Dental Injury: Teeth and Oropharynx as per pre-operative assessment

## 2022-05-26 NOTE — Transfer of Care (Signed)
Immediate Anesthesia Transfer of Care Note  Patient: Albert Hardy.  Procedure(s) Performed: BRONCHIAL BRUSHINGS BRONCHIAL BIOPSIES VIDEO BRONCHOSCOPY WITHOUT FLUORO  Patient Location: PACU  Anesthesia Type:General  Level of Consciousness: awake, alert , patient cooperative, and responds to stimulation  Airway & Oxygen Therapy: Patient Spontanous Breathing and Patient connected to face mask oxygen  Post-op Assessment: Report given to RN and Post -op Vital signs reviewed and stable  Post vital signs: Reviewed and stable  Last Vitals:  Vitals Value Taken Time  BP 134/70 05/26/22 1047  Temp    Pulse 75 05/26/22 1049  Resp 14 05/26/22 1049  SpO2 100 % 05/26/22 1049  Vitals shown include unvalidated device data.  Last Pain:  Vitals:   05/26/22 0948  TempSrc:   PainSc: 0-No pain         Complications: No notable events documented.

## 2022-05-28 ENCOUNTER — Encounter (HOSPITAL_COMMUNITY): Payer: Self-pay | Admitting: Pulmonary Disease

## 2022-05-28 LAB — CYTOLOGY - NON PAP

## 2022-05-29 ENCOUNTER — Inpatient Hospital Stay: Payer: Medicare Other | Attending: Internal Medicine

## 2022-05-29 ENCOUNTER — Other Ambulatory Visit: Payer: Self-pay

## 2022-05-29 ENCOUNTER — Inpatient Hospital Stay (HOSPITAL_BASED_OUTPATIENT_CLINIC_OR_DEPARTMENT_OTHER): Payer: Medicare Other | Admitting: Internal Medicine

## 2022-05-29 VITALS — BP 152/62 | HR 60 | Temp 98.3°F | Resp 18 | Wt 167.2 lb

## 2022-05-29 DIAGNOSIS — R911 Solitary pulmonary nodule: Secondary | ICD-10-CM

## 2022-05-29 DIAGNOSIS — C772 Secondary and unspecified malignant neoplasm of intra-abdominal lymph nodes: Secondary | ICD-10-CM | POA: Diagnosis not present

## 2022-05-29 DIAGNOSIS — Z79899 Other long term (current) drug therapy: Secondary | ICD-10-CM | POA: Diagnosis not present

## 2022-05-29 DIAGNOSIS — Z807 Family history of other malignant neoplasms of lymphoid, hematopoietic and related tissues: Secondary | ICD-10-CM | POA: Diagnosis not present

## 2022-05-29 DIAGNOSIS — C3412 Malignant neoplasm of upper lobe, left bronchus or lung: Secondary | ICD-10-CM | POA: Diagnosis not present

## 2022-05-29 DIAGNOSIS — Z800 Family history of malignant neoplasm of digestive organs: Secondary | ICD-10-CM | POA: Insufficient documentation

## 2022-05-29 DIAGNOSIS — R599 Enlarged lymph nodes, unspecified: Secondary | ICD-10-CM

## 2022-05-29 DIAGNOSIS — C3492 Malignant neoplasm of unspecified part of left bronchus or lung: Secondary | ICD-10-CM

## 2022-05-29 DIAGNOSIS — F17290 Nicotine dependence, other tobacco product, uncomplicated: Secondary | ICD-10-CM | POA: Insufficient documentation

## 2022-05-29 DIAGNOSIS — M899 Disorder of bone, unspecified: Secondary | ICD-10-CM | POA: Diagnosis not present

## 2022-05-29 DIAGNOSIS — C7951 Secondary malignant neoplasm of bone: Secondary | ICD-10-CM | POA: Insufficient documentation

## 2022-05-29 DIAGNOSIS — C3490 Malignant neoplasm of unspecified part of unspecified bronchus or lung: Secondary | ICD-10-CM

## 2022-05-29 HISTORY — DX: Malignant neoplasm of unspecified part of left bronchus or lung: C34.92

## 2022-05-29 LAB — COMPREHENSIVE METABOLIC PANEL WITH GFR
ALT: 18 U/L (ref 0–44)
AST: 37 U/L (ref 15–41)
Albumin: 4.4 g/dL (ref 3.5–5.0)
Alkaline Phosphatase: 78 U/L (ref 38–126)
Anion gap: 9 (ref 5–15)
BUN: 24 mg/dL — ABNORMAL HIGH (ref 8–23)
CO2: 27 mmol/L (ref 22–32)
Calcium: 9.7 mg/dL (ref 8.9–10.3)
Chloride: 103 mmol/L (ref 98–111)
Creatinine, Ser: 1.44 mg/dL — ABNORMAL HIGH (ref 0.61–1.24)
GFR, Estimated: 54 mL/min — ABNORMAL LOW
Glucose, Bld: 121 mg/dL — ABNORMAL HIGH (ref 70–99)
Potassium: 3.9 mmol/L (ref 3.5–5.1)
Sodium: 139 mmol/L (ref 135–145)
Total Bilirubin: 0.4 mg/dL (ref 0.3–1.2)
Total Protein: 7.9 g/dL (ref 6.5–8.1)

## 2022-05-29 LAB — CBC WITH DIFFERENTIAL/PLATELET
Abs Immature Granulocytes: 0.02 10*3/uL (ref 0.00–0.07)
Basophils Absolute: 0 10*3/uL (ref 0.0–0.1)
Basophils Relative: 0 %
Eosinophils Absolute: 0.1 10*3/uL (ref 0.0–0.5)
Eosinophils Relative: 1 %
HCT: 39.9 % (ref 39.0–52.0)
Hemoglobin: 13.1 g/dL (ref 13.0–17.0)
Immature Granulocytes: 0 %
Lymphocytes Relative: 31 %
Lymphs Abs: 1.9 10*3/uL (ref 0.7–4.0)
MCH: 29.2 pg (ref 26.0–34.0)
MCHC: 32.8 g/dL (ref 30.0–36.0)
MCV: 88.9 fL (ref 80.0–100.0)
Monocytes Absolute: 0.7 10*3/uL (ref 0.1–1.0)
Monocytes Relative: 12 %
Neutro Abs: 3.4 10*3/uL (ref 1.7–7.7)
Neutrophils Relative %: 56 %
Platelets: 228 10*3/uL (ref 150–400)
RBC: 4.49 MIL/uL (ref 4.22–5.81)
RDW: 13.3 % (ref 11.5–15.5)
WBC: 6.1 10*3/uL (ref 4.0–10.5)
nRBC: 0 % (ref 0.0–0.2)

## 2022-05-29 NOTE — Progress Notes (Signed)
[facility] Telephone:([phone]) [phone]   Fax:(336) [phone]  CONSULT NOTE  REFERRING PHYSICIAN: Dr. Elige Radon Icard  REASON FOR CONSULTATION:  67 years old African-American male recently diagnosed with lung cancer  HPI Albert Hardy. is a 67 y.o. male with past medical history significant for hypertension, dyslipidemia, GERD, aortic stenosis, aortic regurgitation, peripheral vascular disease, COPD as well as substance abuse.  The patient was followed by CT screening program of the lung and most recently had CT on 03/30/2022 and that showed new focal occlusion of the left upper lobe apical posterior segmental bronchus with new mild patchy tree-in-bud type opacities throughout the posterior left upper lobe and occlusive Endo bronchial lesion could not be excluded.  This was followed by CT scan of the chest with contrast on 05/12/2022 and that showed left upper lobe perihilar mass measuring up to 3.3 cm highly concerning for primary lung malignancy.  There was enlarging AP window lymph node concerning for nodal metastatic disease and new lytic lesion of the T8 vertebral body highly concerning for metastatic disease with possible involvement of the left foramina.  There was also numerous small solid centrilobular nodules of the left upper lobe likely postobstructive changes.  On May 26, 2022 the patient underwent video bronchoscopy under the care of Dr. Tonia Brooms and the final pathology [id number]) showed poorly differentiated squamous cell carcinoma. The patient is referred to me today for evaluation and recommendation regarding treatment of his condition. When seen today he is feeling fine but very anxious about his diagnosis.  He denied having any chest pain but has shortness of breath with exertion and cough productive of clear sputum and no hemoptysis.  He has no headache but has blurry vision.  He has no weight loss or night sweats.  He has no nausea, vomiting, diarrhea or  constipation. Family history significant for mother who had colon cancer at age 22 and she still alive at age 74.  Father died in a car accident and had history of hypertension.  He had brother who has multiple myeloma. The patient is married and has 1 son age 58.  He used to work in [employer].  He has a history for smoking for around 40 years and quit in 2013 but now vaping.  He drinks alcohol on weekends and he also smokes marijuana at regular basis.  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   Peripheral vascular disease with claudication    ABI .49     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   Substance abuse     Past Surgical History:  Procedure Laterality Date   BRONCHIAL BIOPSY  05/26/2022   Procedure: BRONCHIAL BIOPSIES;  Surgeon: Josephine Igo, DO;  Location: [facility] ENDOSCOPY;  Service: Pulmonary;;   BRONCHIAL BRUSHINGS  05/26/2022   Procedure: BRONCHIAL BRUSHINGS;  Surgeon: Josephine Igo, DO;  Location: [facility] ENDOSCOPY;  Service: Pulmonary;;   NO PAST SURGERIES     VIDEO  BRONCHOSCOPY  05/26/2022   Procedure: VIDEO BRONCHOSCOPY WITHOUT FLUORO;  Surgeon: Josephine Igo, DO;  Location: [facility] ENDOSCOPY;  Service: Pulmonary;;    Family History  Problem Relation Age of Onset   Other Brother    Hypertension Brother    Hyperlipidemia Brother    Arthritis Mother    Hypertension Mother    Hyperlipidemia Mother    Colon cancer Mother         89's   Hypertension Sister    Hyperlipidemia Sister    Diabetes Sister    Heart attack Father    Hyperlipidemia Brother     Social History Social History   Tobacco Use   Smoking status: Former    Packs/day: 1.00    Years: 30.00    Additional pack years: 0.00    Total pack years: 30.00    Types: Cigarettes    Quit date: 12/21/2011    Years since quitting: 10.4   Smokeless tobacco: Never  Vaping Use   Vaping Use: Every day  Substance Use Topics   Alcohol use: Yes    Comment: Ocassionaly.    Drug use: Yes    Frequency: 1.0 times per week    Types: Marijuana    Allergies  Allergen Reactions   Lipitor [Atorvastatin]     myalgia   Pravastatin Other (See Comments)    myalgia    Current Outpatient Medications  Medication Sig Dispense Refill   albuterol (VENTOLIN HFA) 108 (90 Base) MCG/ACT inhaler Inhale 2 puffs into the lungs every 6 (six) hours as needed for wheezing or shortness of breath. 8 g 6   Ascorbic Acid (VITAMIN C PO) Take 1 tablet by mouth daily.     Cholecalciferol (VITAMIN D3) 1000 UNITS CAPS Take 1,000 Units by mouth daily.     Coenzyme Q10 (CO Q 10 PO) Take 1 tablet by mouth daily.     Cyanocobalamin (VITAMIN B-12 PO) Take 1 tablet by mouth daily.     ezetimibe (ZETIA) 10 MG tablet Take 1 tablet (10 mg total) by mouth daily. 90 tablet 3   fluticasone (FLONASE) 50 MCG/ACT nasal spray Place 1-2 sprays into both nostrils daily. (Patient taking differently: Place 1-2 sprays into both nostrils daily as needed for allergies.) 16 g 6   ibuprofen (ADVIL) 200 MG tablet Take 400 mg by mouth every 6 (six) hours as needed for moderate pain.     KRILL OIL PO Take 1 tablet by mouth daily.     lisinopril-hydrochlorothiazide (ZESTORETIC) 20-12.5 MG tablet Take 2 tablets by mouth once daily 180 tablet 0   MAGNESIUM PO Take 1 tablet by mouth daily.     Multiple Vitamins-Minerals (CENTRUM SILVER PO) Take 1 tablet by mouth daily.     Multiple Vitamins-Minerals (ZINC PO)  Take 1 tablet by mouth daily.     Naphazoline-Pheniramine (OPCON-A) 0.027-0.315 % SOLN Place 1 drop into both eyes daily as needed (redness).     nicotine (NICODERM CQ - DOSED IN MG/24 HOURS) 14 mg/24hr patch /day for 6 weeks then /day x 2 weeks (Patient not taking: Reported on 05/25/2022) 28 patch 1   omeprazole (PRILOSEC) 20 MG capsule Take 1 capsule by mouth once daily 90 capsule 0   rosuvastatin (CRESTOR) 5 MG tablet TAKE 1 TABLET BY MOUTH AT BEDTIME 90 tablet 0   TURMERIC PO Take 1 tablet by mouth 3 (three) times a week.     umeclidinium-vilanterol (ANORO ELLIPTA) 62.5-25 MCG/ACT AEPB Inhale 1 puff into the lungs daily. (Patient  taking differently: Inhale 1 puff into the lungs daily as needed (shortness of breath).) 180 each 3   No current facility-administered medications for this visit.    Review of Systems  Constitutional: negative Eyes: negative Ears, nose, mouth, throat, and face: negative Respiratory: positive for cough and dyspnea on exertion Cardiovascular: negative Gastrointestinal: negative Genitourinary:negative Integument/breast: negative Hematologic/lymphatic: negative Musculoskeletal:positive for back pain Neurological: negative Behavioral/Psych: negative Endocrine: negative Allergic/Immunologic: negative  Physical Exam  ZOX:WRUEA, healthy, no distress, well nourished, well developed, and anxious SKIN: skin color, texture, turgor are normal, no rashes or significant lesions HEAD: Normocephalic, No masses, lesions, tenderness or abnormalities EYES: normal, PERRLA, Conjunctiva are pink and non-injected EARS: External ears normal, Canals clear OROPHARYNX:no exudate, no erythema, and lips, buccal mucosa, and tongue normal  NECK: supple, no adenopathy, no JVD LYMPH:  no palpable lymphadenopathy, no hepatosplenomegaly LUNGS: clear to auscultation , and palpation HEART: regular rate & rhythm, no murmurs, and no gallops ABDOMEN:abdomen soft, non-tender, normal  bowel sounds, and no masses or organomegaly BACK: Back symmetric, no curvature., No CVA tenderness EXTREMITIES:no joint deformities, effusion, or inflammation, no edema  NEURO: alert & oriented x 3 with fluent speech, no focal motor/sensory deficits  PERFORMANCE STATUS: ECOG 1  LABORATORY DATA: Lab Results  Component Value Date   WBC 6.1 05/29/2022   HGB 13.1 05/29/2022   HCT 39.9 05/29/2022   MCV 88.9 05/29/2022   PLT 228 05/29/2022      Chemistry      Component Value Date/Time   NA 140 05/26/2022 0914   NA 145 (H) 10/21/2021 1038   K 3.4 (L) 05/26/2022 0914   CL 104 05/26/2022 0914   CO2 23 10/21/2021 1038   BUN 21 05/26/2022 0914   BUN 19 10/21/2021 1038   CREATININE 1.30 (H) 05/26/2022 0914   CREATININE 1.07 08/27/2014 1511      Component Value Date/Time   CALCIUM 9.4 10/21/2021 1038   ALKPHOS 65 10/21/2021 1038   AST 22 10/21/2021 1038   ALT 18 10/21/2021 1038   BILITOT <0.2 10/21/2021 1038       RADIOGRAPHIC STUDIES: CT Chest W Contrast  Result Date: 05/12/2022 CLINICAL DATA:  Follow-up left upper lobe bronchial occlusion; * Tracking Code: BO * EXAM: CT CHEST WITH CONTRAST TECHNIQUE: Multidetector CT imaging of the chest was performed during intravenous contrast administration. RADIATION DOSE REDUCTION: This exam was performed according to the departmental dose-optimization program which includes automated exposure control, adjustment of the mA and/or kV according to patient size and/or use of iterative reconstruction technique. CONTRAST:  75mL ISOVUE-370 IOPAMIDOL (ISOVUE-370) INJECTION 76% COMPARISON:  CT dated March 30, 2022 FINDINGS: Cardiovascular: Normal heart size. No pericardial effusion. Normal caliber thoracic aorta with mild calcified plaque. No suspicious filling defects of the central pulmonary arteries. Mediastinum/Nodes: Esophagus and thyroid are unremarkable. Enlarged AP window lymph node measuring 1.2 cm on series 2, image 87. Lungs/Pleura: Left  upper lobe perihilar mass measuring 1.7 x 3.3 cm on series 2, image 70. Secondary occlusion of the left upper lobe bronchus. Numerous small centrilobular nodules of the left upper lobe, likely postobstructive change. Mild paraseptal and centrilobular emphysema. Mild bibasilar linear and nodular opacities, including a new solid pulmonary nodule of the right lower lobe measuring 4 mm on series 3, image 131, likely secondary to aspiration or infection. No pleural effusion or pneumothorax. Upper Abdomen: No acute abnormality. Musculoskeletal: New lytic lesion of the T8 vertebral body with possible involvement of the left foramina. IMPRESSION: 1. Left upper lobe perihilar mass  measuring up to 3.3 cm, highly concerning for primary lung malignancy. Lung-RADS 4X, highly suspicious. Additional imaging evaluation or consultation with Pulmonology or Thoracic Surgery recommended. 2. Enlarged AP window lymph node, concerning for nodal metastatic disease. 3. New lytic lesion of the T8 vertebral body, highly concerning for metastatic disease. Possible involvement of the left foramina. This could be further evaluated with contrast MRI of the thoracic spine. 4. Numerous small solid centrilobular nodules of the left upper lobe, likely postobstructive change. 5. Mild bibasilar linear and nodular opacities, including a new solid pulmonary nodule of the right lower lobe measuring 4 mm. Findings are likely secondary to aspiration or infection. Recommend attention on follow-up. 6. Aortic Atherosclerosis (ICD10-I70.0) and Emphysema (ICD10-J43.9). These results will be called to the ordering clinician or representative by the Radiologist Assistant, and communication documented in the PACS or [employer]. Electronically Signed   By: Allegra Lai M.D.   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   PLAN: I had a lengthy discussion with the patient today about his current disease stage, prognosis and possible treatment options. I personally and independently reviewed the scan images and discussed the results with the patient today. I recommended for the patient to complete the staging workup and he is scheduled for a PET scan on June 04, 2022.  I will order MRI of the brain to rule out brain metastasis. We did send his tissue and blood sample to [facility] for molecular studies and PD-L1 expression.  The results are still pending. I discussed with the patient his potential treatment options including a course of combination of systemic chemotherapy with carboplatin for AUC of 5, paclitaxel 175 Mg/M2 with Libtayo (Cempilimab) 350 Mg IV every 3 weeks with Neulasta support if he has stage IV lung cancer versus a course of concurrent chemoradiation with weekly carboplatin for AUC of 2 and paclitaxel 45 Mg/M2 for 6-7 weeks after the T8 vertebral lesion is not consistent with metastasis on the PET scan. I will arrange for the patient a follow-up appointment with me in around 10 days for more detailed discussion of his treatment options based on the final staging workup and molecular studies. The patient was strongly advised to quit vaping. He was advised to call immediately if he has any other concerning symptoms in the interval. The patient voices understanding of current disease status and treatment options and is in agreement with the current care plan.  All questions were answered. The patient knows to call the clinic with any problems, questions or concerns. We can certainly see the patient much sooner if necessary.  Thank you so much for allowing me to participate in the care of [name]. I will continue to follow up  the patient with you and assist in his care.  The total time spent in the appointment was 90 minutes.  Disclaimer: This note was dictated with voice recognition software. Similar sounding words can inadvertently be transcribed and may not be corrected upon review.   Lajuana Matte May 29, 2022, 8:55 AM

## 2022-05-29 NOTE — Progress Notes (Signed)
NN met with pt today at his consult with Dr.Mohamed. The plan for the pt is for either concurrent chemorad or chemoimmunotherapy. Pt needs a PET scan and brain MRI to conplete his staging. The pt's PET scan is scheduled for 4/25. The pt is aware of the appt and has a means of trasnportation to get himself there. Pt understands that we have to wait until the results of his PET scan and brain MRI before it can be deteremined if he has stage III or stage IV disease, which will determine the course of treatment. Order for brain MRI placed. NN will schedule the MRI for the pt and contact him with the date/time. NN provided card with contact information and encouraged the pt to call with any questions or concerns.   NN was able to arrange for the pt to have his brain MRI on 4/29 at 5:00pm at [facility]. NN called the pt's preferred number, but there was no answer. LVM with the details of the appt and requested the pt call back to confirm he has received the message.

## 2022-06-02 ENCOUNTER — Ambulatory Visit (INDEPENDENT_AMBULATORY_CARE_PROVIDER_SITE_OTHER): Payer: Medicare Other | Admitting: Acute Care

## 2022-06-02 ENCOUNTER — Encounter: Payer: Self-pay | Admitting: Cardiology

## 2022-06-02 ENCOUNTER — Encounter: Payer: Self-pay | Admitting: Acute Care

## 2022-06-02 ENCOUNTER — Telehealth: Payer: Self-pay

## 2022-06-02 VITALS — BP 122/68 | HR 87 | Ht 69.0 in | Wt 167.0 lb

## 2022-06-02 DIAGNOSIS — Z87891 Personal history of nicotine dependence: Secondary | ICD-10-CM | POA: Diagnosis not present

## 2022-06-02 DIAGNOSIS — R911 Solitary pulmonary nodule: Secondary | ICD-10-CM

## 2022-06-02 DIAGNOSIS — C3492 Malignant neoplasm of unspecified part of left bronchus or lung: Secondary | ICD-10-CM | POA: Diagnosis not present

## 2022-06-02 NOTE — Patient Instructions (Signed)
It is good to see you today. Your biopsy was positive for lung cancer . You are scheduled for PET scan 06/04/2022 You are scheduled for MR Brain 06/08/2022 This will complete your staging. Follow up with Dr. Arbutus Ped as scheduled You are in great hands. Follow up with your cardiologist as we discussed. Please contact office for sooner follow up if symptoms do not improve or worsen or seek emergency care

## 2022-06-02 NOTE — Progress Notes (Signed)
History of Present Illness Albert Hardy. is a 67 y.o. male  former smoker with past medical history significant for hypertension, dyslipidemia, GERD, aortic stenosis, aortic regurgitation, peripheral vascular disease, COPD as well as substance abuse. He has  30 pack year smoking history,quit 2013, with  an abnormal  Screening CT Chest . He is followed by the [facility]. He was referred to see Dr. Tonia Brooms  for bronchoscopy with biopsy of left upper lobe mass. He underwent bronch with biopsies 05/26/2022.    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 Presents for Follow up for video bronchoscopy with conscious sedation on 05/26/2022 to better evaluate a left upper lobe mass. He underwent the procedure and states he has done well since. + hemoptysis x 3-4 days which cleared. He denies fever, no discolored secretions. No adverse effects of anesthesia. He is anxious. He has just began to realize the gravity of his diagnosis.  We have reviewed his biopsy results. Biopsy showed non-small cell lung cancer, squamous cell , likely stage IV.( LUL, AP window lymphadenopathy, suspicious bone mets to T8 vertebrae. He was referred to Dr. Arbutus Ped, and was seen in clinic 4/22. He ordered PET scan and MRI Brain to complete staging work up. He has ordered tissue and blood sample to [facility] for molecular studies and PD-L1 expression. He is scheduled for PET scan 06/04/2022 and MR Brain 06/08/2022. He will have follow up with Dr. Arbutus Ped.    Test Results: Cytology 05/26/2022 FINAL MICROSCOPIC DIAGNOSIS:  A. LUNG, LUL, BRUSHING:  - Non-small cell carcinoma, see part B.   B. LUNG, LUL, ENDOBRONCHIAL BIOPSY:  - Poorly differentiated squamous cell  carcinoma   Note: Immunohistochemical stains were performed on the cellblock and the  cells of interest are positive for p63 and CK5/6 and are negative for  TTF-1, Napsin, CK7, CD56 and synaptophysin.  The proliferation rate  difficult to interpret given the extensive crush artifact.  The above  findings are consistent with a poorly differentiated squamous cell  carcinoma.   Abnormal screening scan 09/23/2021>> LR 3>> New clustered indistinct peribronchovascular nodularity in the posteromedial basilar right lower lobe, largest 5.3 mm in volume derived mean diameter, most likely nonspecific mild infectious or inflammatory bronchiolitis. Plan was for a 6 month follow up  Abnormal Screening scan 03/30/2022>> LR 4A>> New focal occlusion of the left upper lobe apicoposterior segmental bronchus with new mild patchy tree-in-bud type opacity throughout the posterior left upper lobe. Occlusive endobronchial lesion cannot be excluded, although the differential includes mucus plugging. Waxing and waning mild patchy tree-in-bud opacities in the posterior lower lobes bilaterally compatible with nonspecific bronchiolitis such as due to recurrent mild aspiration. Treated with Mucinex and Flutter valve >> Repeat CT Chest 6 weeks  Repeat CT Chest without contrast 05/12/2022  Left upper lobe perihilar mass measuring up to 3.3 cm, highly concerning for primary lung malignancy. Lung-RADS 4X, highly suspicious. Additional imaging evaluation or consultation with Pulmonology or Thoracic Surgery recommended. 2. Enlarged AP window lymph node, concerning for nodal metastatic disease. 3. New lytic lesion of the T8 vertebral body, highly concerning for metastatic disease. Possible involvement of the left foramina. This could be further evaluated with contrast MRI of the thoracic spine. 4. Numerous small solid centrilobular  nodules of the left upper lobe, likely postobstructive change. 5. Mild bibasilar linear and  nodular opacities, including a new solid pulmonary nodule of the right lower lobe measuring 4 mm. Findings are likely secondary to aspiration or infection. Recommend attention on follow-up. 6. Aortic Atherosclerosis (ICD10-I70.0) and Emphysema (ICD10-J43.9).     Latest Ref Rng & Units 05/29/2022    8:26 AM 05/26/2022    9:14 AM 05/26/2022    9:04 AM  CBC  WBC 4.0 - 10.5 K/uL 6.1     Hemoglobin 13.0 - 17.0 g/dL 16.1  09.6  04.5   Hematocrit 39.0 - 52.0 % 39.9  41.0  46.0   Platelets 150 - 400 K/uL 228          Latest Ref Rng & Units 05/29/2022    8:26 AM 05/26/2022    9:14 AM 05/26/2022    9:04 AM  BMP  Glucose 70 - 99 mg/dL 409  811  914   BUN 8 - 23 mg/dL 24  21  33   Creatinine 0.61 - 1.24 mg/dL 7.82  9.56  2.13   Sodium 135 - 145 mmol/L 139  140  137   Potassium 3.5 - 5.1 mmol/L 3.9  3.4  6.4   Chloride 98 - 111 mmol/L 103  104  106   CO2 22 - 32 mmol/L 27     Calcium 8.9 - 10.3 mg/dL 9.7       BNP No results found for: "BNP"  ProBNP    Component Value Date/Time   PROBNP 56 03/03/2019 1427    PFT    Component Value Date/Time   FEV1PRE 3.20 02/19/2020 0857   FEV1POST 3.22 02/19/2020 0857   FVCPRE 4.15 02/19/2020 0857   FVCPOST 4.17 02/19/2020 0857   TLC 6.33 02/19/2020 0857   DLCOUNC 24.45 02/19/2020 0857   PREFEV1FVCRT 77 02/19/2020 0857   PSTFEV1FVCRT 77 02/19/2020 0857    CT Chest W Contrast  Result Date: 05/12/2022 CLINICAL DATA:  Follow-up left upper lobe bronchial occlusion; * Tracking Code: BO * EXAM: CT CHEST WITH CONTRAST TECHNIQUE: Multidetector CT imaging of the chest was performed during intravenous contrast administration. RADIATION DOSE REDUCTION: This exam was performed according to the departmental dose-optimization program which includes automated exposure control, adjustment of the mA and/or kV according to patient size and/or use of iterative reconstruction technique. CONTRAST:  75mL ISOVUE-370 IOPAMIDOL (ISOVUE-370) INJECTION 76% COMPARISON:   CT dated March 30, 2022 FINDINGS: Cardiovascular: Normal heart size. No pericardial effusion. Normal caliber thoracic aorta with mild calcified plaque. No suspicious filling defects of the central pulmonary arteries. Mediastinum/Nodes: Esophagus and thyroid are unremarkable. Enlarged AP window lymph node measuring 1.2 cm on series 2, image 87. Lungs/Pleura: Left upper lobe perihilar mass measuring 1.7 x 3.3 cm on series 2, image 70. Secondary occlusion of the left upper lobe bronchus. Numerous small centrilobular nodules of the left upper lobe, likely postobstructive change. Mild paraseptal and centrilobular emphysema. Mild bibasilar linear and nodular opacities, including a new solid pulmonary nodule of the right lower lobe measuring 4 mm on series 3, image 131, likely secondary to aspiration or infection. No pleural effusion or pneumothorax. Upper Abdomen: No acute abnormality. Musculoskeletal: New lytic lesion of the T8 vertebral body with possible involvement of the left foramina. IMPRESSION: 1. Left upper lobe perihilar mass measuring up to 3.3 cm, highly concerning for primary lung malignancy. Lung-RADS 4X, highly suspicious. Additional imaging evaluation or consultation with Pulmonology or Thoracic Surgery recommended. 2. Enlarged AP window lymph  node, concerning for nodal metastatic disease. 3. New lytic lesion of the T8 vertebral body, highly concerning for metastatic disease. Possible involvement of the left foramina. This could be further evaluated with contrast MRI of the thoracic spine. 4. Numerous small solid centrilobular nodules of the left upper lobe, likely postobstructive change. 5. Mild bibasilar linear and nodular opacities, including a new solid pulmonary nodule of the right lower lobe measuring 4 mm. Findings are likely secondary to aspiration or infection. Recommend attention on follow-up. 6. Aortic Atherosclerosis (ICD10-I70.0) and Emphysema (ICD10-J43.9). These results will be called to  the ordering clinician or representative by the Radiologist Assistant, and communication documented in the PACS or [employer]. Electronically Signed   By: Allegra Lai M.D.   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    Drug use: Yes    Frequency: 1.0 times per week    Types: Marijuana    Mr.Runner reports that he quit smoking about 10 years ago. His smoking use included cigarettes. He has a 30.00 pack-year smoking history. He has never used smokeless tobacco. He reports current alcohol use. He reports current drug use. Frequency: 1.00 time per week. Drug: Marijuana.  Tobacco Cessation: Former smoker , Quit 2014 with a 30 pack year smoking history   Past surgical hx, Family hx, Social hx all reviewed.  Current Outpatient Medications on File Prior to Visit  Medication Sig   albuterol (VENTOLIN HFA) 108 (90 Base)  MCG/ACT inhaler Inhale 2 puffs into the lungs every 6 (six) hours as needed for wheezing or shortness of breath.   Ascorbic Acid (VITAMIN C PO) Take 1 tablet by mouth daily.   Cholecalciferol (VITAMIN D3) 1000 UNITS CAPS Take 1,000 Units by mouth daily.   Coenzyme Q10 (CO Q 10 PO) Take 1 tablet by mouth daily.   Cyanocobalamin (VITAMIN B-12 PO) Take 1 tablet by mouth daily.   ezetimibe (ZETIA) 10 MG tablet Take 1 tablet (10 mg total) by mouth daily.   fluticasone (FLONASE) 50 MCG/ACT nasal spray Place 1-2 sprays into both nostrils daily. (Patient taking differently: Place 1-2 sprays into both nostrils daily as needed for allergies.)   ibuprofen (ADVIL) 200 MG tablet Take 400 mg by mouth every 6 (six) hours as needed for moderate  pain.   KRILL OIL PO Take 1 tablet by mouth daily.   lisinopril-hydrochlorothiazide (ZESTORETIC) 20-12.5 MG tablet Take 2 tablets by mouth once daily   MAGNESIUM PO Take 1 tablet by mouth daily.   Multiple Vitamins-Minerals (CENTRUM SILVER PO) Take 1 tablet by mouth daily.   Multiple Vitamins-Minerals (ZINC PO) Take 1 tablet by mouth daily.   Naphazoline-Pheniramine (OPCON-A) 0.027-0.315 % SOLN Place 1 drop into both eyes daily as needed (redness).   nicotine (NICODERM CQ - DOSED IN MG/24 HOURS) 14 mg/24hr patch 14mg /day for 6 weeks then 7mg /day x 2 weeks   omeprazole (PRILOSEC) 20 MG capsule Take 1 capsule by mouth once daily   rosuvastatin (CRESTOR) 5 MG tablet TAKE 1 TABLET BY MOUTH AT BEDTIME   TURMERIC PO Take 1 tablet by mouth 3 (three) times a week.   umeclidinium-vilanterol (ANORO ELLIPTA) 62.5-25 MCG/ACT AEPB Inhale 1 puff into the lungs daily. (Patient taking differently: Inhale 1 puff into the lungs daily as needed (shortness of breath).)   No current facility-administered medications on file prior to visit.     Allergies  Allergen Reactions   Lipitor [Atorvastatin]     myalgia   Pravastatin Other (See Comments)    myalgia    Review Of  Systems:  Constitutional:   No  weight loss, night sweats,  Fevers, chills, fatigue, or  lassitude.  HEENT:   No headaches,  Difficulty swallowing,  Tooth/dental problems, or  Sore throat,                No sneezing, itching, ear ache, nasal congestion, post nasal drip,   CV:  No chest pain,  Orthopnea, PND, swelling in lower extremities, anasarca, dizziness, palpitations, syncope.   GI  No heartburn, indigestion, abdominal pain, nausea, vomiting, diarrhea, change in bowel habits, loss of appetite, bloody stools.   Resp: No shortness of breath with exertion or at rest.  No excess mucus, no productive cough,  No non-productive cough,  No coughing up of blood.  No change in color of mucus.  No wheezing.  No chest wall deformity  Skin: no rash or lesions.  GU: no dysuria, change in color of urine, no urgency or frequency.  No flank pain, no hematuria   MS:  No joint pain or swelling.  No decreased range of motion.  No back pain.  Psych:  No change in mood or affect. No depression or anxiety.  No memory loss.   Vital Signs  BP 122/68   Pulse 87   Ht 5\' 9"  (1.753 m)   Wt 167 lb (75.8 kg)   SpO2 100% Comment: on RA  BMI 24.66 kg/m     Physical Exam:  General- No distress,  A&Ox3, pleasant, anxious ENT: No sinus tenderness, TM clear, pale nasal mucosa, no oral exudate,no post nasal drip, no LAN Cardiac: S1, S2, regular rate and rhythm, no murmur Chest: No wheeze/ rales/ dullness; no accessory muscle use, no nasal flaring, no sternal retractions Abd.: Soft Non-tender, ND, BS +, Body mass index is 24.66 kg/m.  Ext: No clubbing cyanosis, edema Neuro:  normal strength, MAE x 4, A&O x 3 Skin: No rashes, warm and dry, no lesions  Psych: normal mood and behavior   Assessment/Plan Likely stage IV (T2a, N2, M1 B) non-small cell lung cancer, squamous cell carcinoma presented with left upper lobe lung mass in addition to AP window lymphadenopathy and suspicious bone metastasis to the T8  vertebrae  Plan Your biopsy was positive for lung cancer .  You are scheduled for PET scan 06/04/2022 You are scheduled for MR Brain 06/08/2022 This will complete your staging. Follow up with Dr. Arbutus Ped as scheduled You are in great hands,  Dr. Arbutus Ped will take great care of you. Follow up with your cardiologist as we discussed. Please contact office for sooner follow up if symptoms do not improve or worsen or seek emergency care    I spent 40 minutes dedicated to the care of this patient on the date of this encounter to include pre-visit review of records, face-to-face time with the patient discussing conditions above, post visit ordering of testing, clinical documentation with the electronic health record, making appropriate referrals as documented, and communicating necessary information to the patient's healthcare team.    Bevelyn Ngo, NP 06/02/2022  8:49 AM

## 2022-06-02 NOTE — Telephone Encounter (Signed)
Received a message from the patient that he wanted to set-up an appointment to have his heart evaluated before he starts chemo and radiation due to lung cancer. He also has been having tingling in his left arm and face. Patient did not answer the phone. Left message for the patient to call back.

## 2022-06-04 ENCOUNTER — Encounter (HOSPITAL_COMMUNITY)
Admission: RE | Admit: 2022-06-04 | Discharge: 2022-06-04 | Disposition: A | Discharge status: Home / Self Care | Payer: Medicare Other | Source: Ambulatory Visit | Attending: Acute Care | Admitting: Acute Care

## 2022-06-04 ENCOUNTER — Other Ambulatory Visit: Payer: Self-pay

## 2022-06-04 DIAGNOSIS — R911 Solitary pulmonary nodule: Secondary | ICD-10-CM

## 2022-06-04 LAB — GLUCOSE, CAPILLARY: Glucose-Capillary: 120 mg/dL — ABNORMAL HIGH (ref 70–99)

## 2022-06-04 MED ORDER — FLUDEOXYGLUCOSE F - 18 (FDG) INJECTION
9.0000 | Freq: Once | INTRAVENOUS | Status: AC | PRN
  Administered 2022-06-04: 9.13 via INTRAVENOUS

## 2022-06-04 NOTE — Progress Notes (Signed)
The proposed treatment discussed in conference is for discussion purpose only and is not a binding recommendation.  The patients have not been physically examined, or presented with their treatment options.  Therefore, final treatment plans cannot be decided.  

## 2022-06-06 ENCOUNTER — Other Ambulatory Visit: Payer: Self-pay | Admitting: Family Medicine

## 2022-06-06 DIAGNOSIS — E785 Hyperlipidemia, unspecified: Secondary | ICD-10-CM

## 2022-06-07 ENCOUNTER — Emergency Department (HOSPITAL_COMMUNITY)
Admission: EM | Admit: 2022-06-07 | Discharge: 2022-06-07 | Disposition: A | Discharge status: Home / Self Care | Payer: Medicare Other | Attending: Emergency Medicine | Admitting: Emergency Medicine

## 2022-06-07 ENCOUNTER — Emergency Department (HOSPITAL_COMMUNITY): Payer: Medicare Other

## 2022-06-07 ENCOUNTER — Encounter (HOSPITAL_COMMUNITY): Payer: Self-pay

## 2022-06-07 DIAGNOSIS — M545 Low back pain, unspecified: Secondary | ICD-10-CM | POA: Insufficient documentation

## 2022-06-07 DIAGNOSIS — M899 Disorder of bone, unspecified: Secondary | ICD-10-CM | POA: Insufficient documentation

## 2022-06-07 DIAGNOSIS — C3490 Malignant neoplasm of unspecified part of unspecified bronchus or lung: Secondary | ICD-10-CM | POA: Insufficient documentation

## 2022-06-07 DIAGNOSIS — Z85118 Personal history of other malignant neoplasm of bronchus and lung: Secondary | ICD-10-CM | POA: Insufficient documentation

## 2022-06-07 MED ORDER — HYDROMORPHONE HCL 1 MG/ML IJ SOLN
0.5000 mg | Freq: Once | INTRAMUSCULAR | Status: AC
  Administered 2022-06-07: 0.5 mg via INTRAVENOUS
  Filled 2022-06-07: qty 1

## 2022-06-07 MED ORDER — METHOCARBAMOL 500 MG PO TABS: 1000.0000 mg | ORAL_TABLET | Freq: Four times a day (QID) | ORAL | 0 refills | Status: DC

## 2022-06-07 MED ORDER — KETOROLAC TROMETHAMINE 15 MG/ML IJ SOLN
15.0000 mg | Freq: Once | INTRAMUSCULAR | Status: AC
  Administered 2022-06-07: 15 mg via INTRAVENOUS
  Filled 2022-06-07: qty 1

## 2022-06-07 MED ORDER — DOCUSATE SODIUM 100 MG PO CAPS: 100.0000 mg | ORAL_CAPSULE | Freq: Two times a day (BID) | ORAL | 0 refills | Status: DC

## 2022-06-07 MED ORDER — ONDANSETRON HCL 4 MG/2ML IJ SOLN
4.0000 mg | Freq: Once | INTRAMUSCULAR | Status: AC
  Administered 2022-06-07: 4 mg via INTRAVENOUS
  Filled 2022-06-07: qty 2

## 2022-06-07 MED ORDER — OXYCODONE HCL 5 MG PO TABS: 5.0000 mg | ORAL_TABLET | Freq: Four times a day (QID) | ORAL | 0 refills | Status: DC | PRN

## 2022-06-07 NOTE — ED Triage Notes (Addendum)
Pt states he was woken up at 2320 last night with lower back pain that radiates out bilaterally.  Denies urinary issues.  No injury/trauma

## 2022-06-07 NOTE — ED Provider Notes (Signed)
[place] EMERGENCY DEPARTMENT AT [facility] Provider Note   CSN: [id number] Arrival date & time: 06/07/22  0800     History  Chief Complaint  Patient presents with   Back Pain    Albert Hardy. is a 67 y.o. male.  Patient presents to the emergency department today for back pain.  Patient has had recent abnormal surveillance CT scans of the chest over the past several months and had lung cancer workup throughout this month including CT, bronchoscopy, PET scan.  Patient with suspicious T7 vertebral lesion noted on previous imaging.  Patient presents today for evaluation of lower back and sacral pain that began last night.  Pain is severe.  He denies any preceding falls or injuries.  Pain does radiate down into his right leg but he denies urinary retention or incontinence, fecal incontinence.  He is having difficulty walking because it is hard for him to straighten up because of the pain.  No lower extremity weakness.  He has tried over-the-counter medications without improvement.       Home Medications Prior to Admission medications   Medication Sig Start Date End Date Taking? Authorizing Provider  albuterol (VENTOLIN HFA) 108 (90 Base) MCG/ACT inhaler Inhale 2 puffs into the lungs every 6 (six) hours as needed for wheezing or shortness of breath. 12/27/20   Glenford Bayley, NP  Ascorbic Acid (VITAMIN C PO) Take 1 tablet by mouth daily.    [provider]  Cholecalciferol (VITAMIN D3) 1000 UNITS CAPS Take 1,000 Units by mouth daily.    [provider]  Coenzyme Q10 (CO Q 10 PO) Take 1 tablet by mouth daily.    [provider]  Cyanocobalamin (VITAMIN B-12 PO) Take 1 tablet by mouth daily.    [provider]  ezetimibe (ZETIA) 10 MG tablet Take 1 tablet (10 mg total) by mouth daily. 09/04/21   Baldo Daub, MD  fluticasone (FLONASE) 50 MCG/ACT nasal spray Place 1-2 sprays into both nostrils daily. Patient taking differently: Place  1-2 sprays into both nostrils daily as needed for allergies. 04/24/20   [name], MD  ibuprofen (ADVIL) 200 MG tablet Take 400 mg by mouth every 6 (six) hours as needed for moderate pain.    [provider]  KRILL OIL PO Take 1 tablet by mouth daily.    [provider]  lisinopril-hydrochlorothiazide (ZESTORETIC) 20-12.5 MG tablet Take 2 tablets by mouth once daily 03/11/22   [name], MD  MAGNESIUM PO Take 1 tablet by mouth daily.    [provider]  Multiple Vitamins-Minerals (CENTRUM SILVER PO) Take 1 tablet by mouth daily.    [provider]  Multiple Vitamins-Minerals (ZINC PO) Take 1 tablet by mouth daily.    [provider]  Naphazoline-Pheniramine (OPCON-A) 0.027-0.315 % SOLN Place 1 drop into both eyes daily as needed (redness).    [provider]  nicotine (NICODERM CQ - DOSED IN MG/24 HOURS) 14 mg/24hr patch 14mg /day for 6 weeks then 7mg /day x 2 weeks 08/19/20   Glenford Bayley, NP  omeprazole (PRILOSEC) 20 MG capsule Take 1 capsule by mouth once daily 03/24/22   [name], MD  rosuvastatin (CRESTOR) 5 MG tablet TAKE 1 TABLET BY MOUTH AT BEDTIME 03/24/22   [name], MD  TURMERIC PO Take 1 tablet by mouth 3 (three) times a week.    [provider]  umeclidinium-vilanterol (ANORO ELLIPTA) 62.5-25 MCG/ACT AEPB Inhale 1 puff into the lungs  daily. Patient taking differently: Inhale 1 puff into the lungs daily as needed (shortness of breath). 01/30/21   Josephine Igo, DO      Allergies    Lipitor [atorvastatin] and Pravastatin    Review of Systems   Review of Systems  Physical Exam Updated Vital Signs BP (!) 164/106 (BP Location: Right Arm)   Pulse 72   Temp 98.6 F (37 C) (Oral)   Resp 16   Ht 5\' 9"  (1.753 m)   Wt 75.8 kg   SpO2 100%   BMI 24.66 kg/m   Physical Exam Vitals and nursing note reviewed.  Constitutional:      General: He is in acute distress.     Appearance: He  is well-developed.     Comments: Patient appears very uncomfortable moving around the bed.  HENT:     Head: Normocephalic and atraumatic.  Eyes:     Conjunctiva/sclera: Conjunctivae normal.  Abdominal:     Palpations: Abdomen is soft.     Tenderness: There is no abdominal tenderness. There is no right CVA tenderness or left CVA tenderness.  Musculoskeletal:     Cervical back: Normal range of motion. No tenderness.     Thoracic back: No tenderness.     Lumbar back: Tenderness present. Decreased range of motion.       Back:     Comments: No step-off noted with palpation of spine.   Skin:    General: Skin is warm and dry.  Neurological:     Mental Status: He is alert.     Sensory: No sensory deficit.     Motor: No abnormal muscle tone.     Deep Tendon Reflexes: Reflexes are normal and symmetric.     Comments: 5/5 strength in entire lower extremities bilaterally. No sensation deficit.   Psychiatric:        Mood and Affect: Mood normal.     ED Results / Procedures / Treatments   Labs (all labs ordered are listed, but only abnormal results are displayed) Labs Reviewed - No data to display  EKG None  Radiology No results found.  Procedures Procedures    Medications Ordered in ED Medications  HYDROmorphone (DILAUDID) injection 0.5 mg (0.5 mg Intravenous Given 06/07/22 0849)  ondansetron (ZOFRAN) injection 4 mg (4 mg Intravenous Given 06/07/22 0848)  ketorolac (TORADOL) 15 MG/ML injection 15 mg (15 mg Intravenous Given 06/07/22 0848)  HYDROmorphone (DILAUDID) injection 0.5 mg (0.5 mg Intravenous Given 06/07/22 1001)    ED Course/ Medical Decision Making/ A&P    Patient seen and examined. History obtained directly from patient.  I spent time looking at the patient's recent pulmonology and oncology evaluation and plan.  Patient currently undergoing staging of his lung cancer which is likely metastatic at this point.  He has not yet started chemotherapy or radiation.  I  personally reviewed his PET scan and he does appear to have a L4 lesion, however this will be confirmed on imaging today.    Labs/EKG: None ordered  Imaging: Ordered CT thoracic, CT lumbar, CT pelvis without contrast.  Medications/Fluids: Ordered: IV Dilaudid 0.5 mg, IV Zofran 4 mg.   Most recent vital signs reviewed and are as follows: BP (!) 164/106 (BP Location: Right Arm)   Pulse 72   Temp 98.6 F (37 C) (Oral)   Resp 16   Ht 5\' 9"  (1.753 m)   Wt 75.8 kg   SpO2 100%   BMI 24.66 kg/m   Initial impression: Lower back  pain, concern for bony metastasis from recently diagnosed lung cancer.    Reassessment performed. Patient appears more comfortable now.  While we were talking, his pain did increase a bit.  Imaging personally visualized and interpreted including: CT imaging performed with thoracic and lumbar lesions noted, likely metastatic.  Reviewed pertinent lab work and imaging with patient at bedside. Questions answered.   Plan: Will give an additional dose of IV Dilaudid.  10:57 AM patient ready for discharge.  Reviewed pertinent lab work and imaging with patient at bedside. Questions answered.   Most current vital signs reviewed and are as follows: BP (!) 164/106 (BP Location: Right Arm)   Pulse 72   Temp 98.6 F (37 C) (Oral)   Resp 16   Ht 5\' 9"  (1.753 m)   Wt 75.8 kg   SpO2 100%   BMI 24.66 kg/m   Plan: Discharge to home.   Prescriptions written for: Oxycodone, Colace, Robaxin  Patient counseled on use of narcotic pain medications. Counseled not to combine these medications with others containing tylenol. Urged not to drink alcohol, drive, or perform any other activities that requires focus while taking these medications. The patient verbalizes understanding and agrees with the plan.  Patient counseled on proper use of muscle relaxant medication.  They were told not to drink alcohol, drive any vehicle, or do any dangerous activities while taking this  medication.  Patient verbalized understanding.  Other home care instructions discussed: Rest, ice and heat  ED return instructions discussed: Weakness in the legs, new numbness or tingling, urinary retention or incontinence, difficulty with bowel movements.  Follow-up instructions discussed: Follow-up with PCP and oncology as planned.  I did send an in basket message to Dr. Shirline Frees to update him on the situation.                             Medical Decision Making Amount and/or Complexity of Data Reviewed Radiology: ordered.  Risk OTC drugs. Prescription drug management.   Patient with back pain.  This is likely related to metastatic disease from recently diagnosed lung cancer.  No neurological deficits today.  Pain did radiate a bit into the right leg.  Patient is ambulatory. No warning symptoms of back pain including: fecal incontinence, urinary retention or overflow incontinence, night sweats, unexplained fevers or weight loss, IVDU, recent trauma. Low concern for cauda equina, epidural abscess.  Will initiate symptom control and have patient follow-up closely as outpatient.  No indication for neurosurgical consultation today.  The patient's vital signs, pertinent lab work and imaging were reviewed and interpreted as discussed in the ED course. Hospitalization was considered for further testing, treatments, or serial exams/observation. However as patient is well-appearing, has a stable exam, and reassuring studies today, I do not feel that they warrant admission at this time. This plan was discussed with the patient who verbalizes agreement and comfort with this plan and seems reliable and able to return to the Emergency Department with worsening or changing symptoms.           Final Clinical Impression(s) / ED Diagnoses Final diagnoses:  Lumbar pain  Lytic lesion of bone on x-ray    Rx / DC Orders ED Discharge Orders          Ordered    oxyCODONE (OXY IR/ROXICODONE) 5  MG immediate release tablet  Every 6 hours PRN        06/07/22 1054    docusate sodium (  COLACE) 100 MG capsule  Every 12 hours        06/07/22 1054    methocarbamol (ROBAXIN) 500 MG tablet  4 times daily        06/07/22 1054              Renne Crigler, PA-C 06/07/22 1059    Gloris Manchester, MD 06/07/22 [phone]

## 2022-06-07 NOTE — Discharge Instructions (Signed)
Please read and follow all provided instructions.  Your diagnoses today include:  1. Lumbar pain   2. Lytic lesion of bone on x-ray    Tests performed today include: Vital signs - see below for your results today CT of the thoracic, lumbar and pelvis: You appear to have cancer that is spread to the T8 and L4 vertebrae.  I suspect that your pain today is coming from the area in the L4 vertebrae.  Medications prescribed:  Oxycodone - narcotic pain medication  DO NOT drive or perform any activities that require you to be awake and alert because this medicine can make you drowsy.   Colace - stool softener  This medication can be found over-the-counter.   Continue colace for 2 weeks after your stools return to normal to prevent constipation.   Robaxin (methocarbamol) - muscle relaxer medication  DO NOT drive or perform any activities that require you to be awake and alert because this medicine can make you drowsy.   Take any prescribed medications only as directed.  Home care instructions:  Follow any educational materials contained in this packet Please rest, use ice or heat on your back for the next several days Do not lift, push, pull anything more than 10 pounds for the next week  Follow-up instructions: Please follow-up with your oncology team as planned.  I have sent them a message today to let them know that you were here.  Return instructions:  SEEK IMMEDIATE MEDICAL ATTENTION IF YOU HAVE: New numbness, tingling, weakness, or problem with the use of your arms or legs Severe back pain not relieved with medications Loss control of your bowels or bladder Increasing pain in any areas of the body (such as chest or abdominal pain) Shortness of breath, dizziness, or fainting.  Worsening nausea (feeling sick to your stomach), vomiting, fever, or sweats Any other emergent concerns regarding your health   Additional Information:  Your vital signs today were: BP (!) 164/106  (BP Location: Right Arm)   Pulse 72   Temp 98.6 F (37 C) (Oral)   Resp 16   Ht 5\' 9"  (1.753 m)   Wt 75.8 kg   SpO2 100%   BMI 24.66 kg/m  If your blood pressure (BP) was elevated above 135/85 this visit, please have this repeated by your doctor within one month. --------------

## 2022-06-08 ENCOUNTER — Ambulatory Visit (HOSPITAL_COMMUNITY)
Admission: RE | Admit: 2022-06-08 | Discharge: 2022-06-08 | Disposition: A | Discharge status: Home / Self Care | Payer: Medicare Other | Source: Ambulatory Visit | Attending: Internal Medicine | Admitting: Internal Medicine

## 2022-06-08 DIAGNOSIS — C3490 Malignant neoplasm of unspecified part of unspecified bronchus or lung: Secondary | ICD-10-CM

## 2022-06-08 MED ORDER — GADOBUTROL 1 MMOL/ML IV SOLN
7.5000 mL | Freq: Once | INTRAVENOUS | Status: AC | PRN
  Administered 2022-06-08: 7.5 mL via INTRAVENOUS

## 2022-06-09 ENCOUNTER — Inpatient Hospital Stay: Payer: Medicare Other

## 2022-06-09 ENCOUNTER — Encounter: Payer: Self-pay | Admitting: Internal Medicine

## 2022-06-09 ENCOUNTER — Inpatient Hospital Stay (HOSPITAL_BASED_OUTPATIENT_CLINIC_OR_DEPARTMENT_OTHER): Payer: Medicare Other | Admitting: Internal Medicine

## 2022-06-09 VITALS — BP 137/72 | HR 73 | Temp 97.3°F | Resp 15 | Wt 163.1 lb

## 2022-06-09 DIAGNOSIS — C3492 Malignant neoplasm of unspecified part of left bronchus or lung: Secondary | ICD-10-CM | POA: Diagnosis not present

## 2022-06-09 DIAGNOSIS — C3412 Malignant neoplasm of upper lobe, left bronchus or lung: Secondary | ICD-10-CM | POA: Diagnosis not present

## 2022-06-09 DIAGNOSIS — C3490 Malignant neoplasm of unspecified part of unspecified bronchus or lung: Secondary | ICD-10-CM

## 2022-06-09 LAB — CBC WITH DIFFERENTIAL (CANCER CENTER ONLY)
Abs Immature Granulocytes: 0.02 10*3/uL (ref 0.00–0.07)
Basophils Absolute: 0 10*3/uL (ref 0.0–0.1)
Basophils Relative: 0 %
Eosinophils Absolute: 0 10*3/uL (ref 0.0–0.5)
Eosinophils Relative: 0 %
HCT: 39.3 % (ref 39.0–52.0)
Hemoglobin: 13.2 g/dL (ref 13.0–17.0)
Immature Granulocytes: 0 %
Lymphocytes Relative: 23 %
Lymphs Abs: 1.8 10*3/uL (ref 0.7–4.0)
MCH: 29.5 pg (ref 26.0–34.0)
MCHC: 33.6 g/dL (ref 30.0–36.0)
MCV: 87.9 fL (ref 80.0–100.0)
Monocytes Absolute: 0.9 10*3/uL (ref 0.1–1.0)
Monocytes Relative: 11 %
Neutro Abs: 5.1 10*3/uL (ref 1.7–7.7)
Neutrophils Relative %: 66 %
Platelet Count: 233 10*3/uL (ref 150–400)
RBC: 4.47 MIL/uL (ref 4.22–5.81)
RDW: 13.2 % (ref 11.5–15.5)
WBC Count: 7.8 10*3/uL (ref 4.0–10.5)
nRBC: 0 % (ref 0.0–0.2)

## 2022-06-09 LAB — CMP (CANCER CENTER ONLY)
ALT: 18 U/L (ref 0–44)
AST: 27 U/L (ref 15–41)
Albumin: 4.4 g/dL (ref 3.5–5.0)
Alkaline Phosphatase: 86 U/L (ref 38–126)
Anion gap: 9 (ref 5–15)
BUN: 21 mg/dL (ref 8–23)
CO2: 31 mmol/L (ref 22–32)
Calcium: 10 mg/dL (ref 8.9–10.3)
Chloride: 99 mmol/L (ref 98–111)
Creatinine: 1.26 mg/dL — ABNORMAL HIGH (ref 0.61–1.24)
GFR, Estimated: >60 mL/min (ref >60)
Glucose, Bld: 126 mg/dL — ABNORMAL HIGH (ref 70–99)
Potassium: 3.4 mmol/L — ABNORMAL LOW (ref 3.5–5.1)
Sodium: 139 mmol/L (ref 135–145)
Total Bilirubin: 0.4 mg/dL (ref 0.3–1.2)
Total Protein: 8.1 g/dL (ref 6.5–8.1)

## 2022-06-09 MED ORDER — PROCHLORPERAZINE MALEATE 10 MG PO TABS: 10.0000 mg | ORAL_TABLET | Freq: Four times a day (QID) | ORAL | 0 refills | Status: AC | PRN

## 2022-06-09 NOTE — Progress Notes (Signed)
START OFF PATHWAY REGIMEN - Non-Small Cell Lung   [id number]:Cemiplimab 350 mg IV D1 + Carboplatin AUC=6 IV D1 + Paclitaxel 200 mg/m2 IV D1 q21 Days x 4 Cycles:   A cycle is every 21 days:     Paclitaxel      Carboplatin      Cemiplimab-rwlc   **Always confirm dose/schedule in your pharmacy ordering system**  Patient Characteristics: Stage IV Metastatic, Squamous, Molecular Analysis Completed, Alteration Present and Targeted Therapy Exhausted or EGFR Exon 20 Insertion or KRAS G12C or HER2 Present, and No Prior Chemo/Immunotherapy or No Alteration Present, PS = 0, 1, Initial  Chemotherapy/Immunotherapy, No Alteration Present, Immunotherapy Candidate, PD-L1 Expression Positive ? 50% (TPS) Therapeutic Status: Stage IV Metastatic Histology: Squamous Cell Molecular Analysis Results: No Alteration Present ECOG Performance Status: 1 Chemotherapy/Immunotherapy Line of Therapy: Initial Chemotherapy/Immunotherapy Immunotherapy Candidate Status: Candidate for Immunotherapy PD-L1 Expression Status: PD-L1 Positive ? 50% (TPS) Intent of Therapy: Non-Curative / Palliative Intent, Discussed with Patient

## 2022-06-09 NOTE — Progress Notes (Signed)
[facility] Telephone:([phone]) [phone]   [phone]  OFFICE PROGRESS NOTE  [name], MD [address] [place] [address]  DIAGNOSIS: Stage IV (T2a, N2, M1 B) non-small cell lung cancer, squamous cell carcinoma presented with left upper lobe lung mass in addition to AP window lymphadenopathy and suspicious bone metastasis to the T8 and L4 vertebrae in addition to retroperitoneal metastatic soft tissue nodule diagnosed in April 2024.   Molecular studies by [id number] showed no actionable mutations and PD-L1 expression of 70%.  PRIOR THERAPY: None.  CURRENT THERAPY: Systemic chemotherapy with carboplatin for AUC of 5, paclitaxel 175 Mg/M2 and Libtayo (Cempilimab) 350 Mg IV every 3 weeks with Neulasta support for 4 cycles followed by maintenance treatment with Libtayo (Cempilimab) 350 Mg IV every 3 weeks.  INTERVAL HISTORY: Albert Hardy. 67 y.o. male returns to the clinic today for follow-up visit accompanied by his wife Albert Hardy.  The patient was seen at the emergency department few days ago with low back pain with radiation to the front.  He had several imaging studies performed at that time including CT of the thoracic and lumbar spines on 06/07/2022 and that showed metastatic lesions in the T8 and L4 vertebrae more notable at T8 where there is extraosseous extension with left epidural and T8-9 foraminal infiltration.  He also had a PET scan on 06/04/2022 and that showed intensely hypermetabolic central perihilar left upper lobe lung mass with associated occlusion of the apical posterior segment of the left upper lobe bronchus compatible with primary bronchogenic carcinoma better delineated on the recent contrast-enhanced CT of the chest.  There was also intensely hypermetabolic mildly enlarged AP window lymph node compatible with ipsilateral mediastinal nodal metastasis and intensely hypermetabolic lytic spinal metastasis at the T8 and L4 levels with  intensely hypermetabolic 1.3 cm lower right paratracheal soft tissue nodule compatible with soft tissue metastasis.  The patient also had MRI of the brain performed yesterday and it was negative for malignancy.  He is here today for evaluation and recommendation regarding treatment of his condition.  He denied having any current chest pain but continues to have shortness of breath with exertion and mild cough with no hemoptysis.  He has no nausea, vomiting, diarrhea or constipation.  He has no headache or visual changes.  He had molecular studies by [id number] that showed no actionable mutations and PD-L1 expression of 70%.  MEDICAL HISTORY: Past Medical History:  Diagnosis Date   Aortic regurgitation    Aortic stenosis 04/04/2018   Atherosclerosis of native arteries of the extremities with intermittent claudication 09/08/2013   Bilateral impacted cerumen 10/13/2018   Bilateral sensorineural hearing loss 12/14/2018   Dyspnea    Former moderate cigarette smoker (10-19 per day) 05/20/2012   Quit smoking Nov 2013 when hospitalized w/ HTN and chest pain(diagnosed w/ valvular heart disease)   GERD (gastroesophageal reflux disease)    Heart murmur    Hyperlipidemia    Hypertension    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  MEDICATIONS:  Current Outpatient Medications  Medication Sig Dispense Refill   albuterol (VENTOLIN HFA) 108 (90 Base) MCG/ACT inhaler Inhale 2 puffs into the lungs every 6 (six) hours as needed for wheezing or shortness of breath. 8  g 6   Ascorbic Acid (VITAMIN C PO) Take 1 tablet by mouth daily.     Cholecalciferol (VITAMIN D3) 1000 UNITS CAPS Take 1,000 Units by mouth daily.     Coenzyme Q10 (CO Q 10 PO) Take 1 tablet by mouth daily.     Cyanocobalamin (VITAMIN B-12 PO)  Take 1 tablet by mouth daily.     docusate sodium (COLACE) 100 MG capsule Take 1 capsule (100 mg total) by mouth every 12 (twelve) hours. 60 capsule 0   ezetimibe (ZETIA) 10 MG tablet Take 1 tablet (10 mg total) by mouth daily. 90 tablet 3   fluticasone (FLONASE) 50 MCG/ACT nasal spray Place 1-2 sprays into both nostrils daily. (Patient taking differently: Place 1-2 sprays into both nostrils daily as needed for allergies.) 16 g 6   ibuprofen (ADVIL) 200 MG tablet Take 400 mg by mouth every 6 (six) hours as needed for moderate pain.     KRILL OIL PO Take 1 tablet by mouth daily.     lisinopril-hydrochlorothiazide (ZESTORETIC) 20-12.5 MG tablet Take 2 tablets by mouth once daily 180 tablet 0   MAGNESIUM PO Take 1 tablet by mouth daily.     methocarbamol (ROBAXIN) 500 MG tablet Take 2 tablets (1,000 mg total) by mouth 4 (four) times daily. 30 tablet 0   Multiple Vitamins-Minerals (CENTRUM SILVER PO) Take 1 tablet by mouth daily.     Multiple Vitamins-Minerals (ZINC PO) Take 1 tablet by mouth daily.     Naphazoline-Pheniramine (OPCON-A) 0.027-0.315 % SOLN Place 1 drop into both eyes daily as needed (redness).     nicotine (NICODERM CQ - DOSED IN MG/24 HOURS) 14 mg/24hr patch 14mg /day for 6 weeks then 7mg /day x 2 weeks 28 patch 1   omeprazole (PRILOSEC) 20 MG capsule Take 1 capsule by mouth once daily 90 capsule 0   oxyCODONE (OXY IR/ROXICODONE) 5 MG immediate release tablet Take 1 tablet (5 mg total) by mouth every 6 (six) hours as needed for severe pain. 15 tablet 0   rosuvastatin (CRESTOR) 5 MG tablet TAKE 1 TABLET BY MOUTH AT BEDTIME 90 tablet 0   TURMERIC PO Take 1 tablet by mouth 3 (three) times a week.     umeclidinium-vilanterol (ANORO ELLIPTA) 62.5-25 MCG/ACT AEPB Inhale 1 puff into the lungs daily. (Patient taking differently: Inhale 1 puff into the lungs daily as needed (shortness of breath).) 180 each 3   No current facility-administered medications for this visit.    SURGICAL HISTORY:   Past Surgical History:  Procedure Laterality Date   BRONCHIAL BIOPSY  05/26/2022   Procedure: BRONCHIAL BIOPSIES;  Surgeon: Josephine Igo, DO;  Location: [facility];  Service: Pulmonary;;   BRONCHIAL BRUSHINGS  05/26/2022   Procedure: BRONCHIAL BRUSHINGS;  Surgeon: Josephine Igo, DO;  Location: [facility];  Service: Pulmonary;;   NO PAST SURGERIES     VIDEO BRONCHOSCOPY  05/26/2022   Procedure: VIDEO BRONCHOSCOPY WITHOUT FLUORO;  Surgeon: Josephine Igo, DO;  Location: [facility];  Service: Pulmonary;;    REVIEW OF SYSTEMS:  Constitutional: positive for fatigue Eyes: negative Ears, nose, mouth, throat, and face: negative Respiratory: positive for dyspnea on exertion Cardiovascular: negative Gastrointestinal: negative Genitourinary:negative Integument/breast: negative Hematologic/lymphatic: negative Musculoskeletal:positive for back pain Neurological: negative Behavioral/Psych: negative Endocrine: negative Allergic/Immunologic: negative   PHYSICAL EXAMINATION: General appearance: alert, cooperative, fatigued, and no distress Head: Normocephalic, without obvious abnormality, atraumatic Neck: no adenopathy, no JVD, supple, symmetrical, trachea midline, and thyroid not enlarged, symmetric, no tenderness/mass/nodules Lymph nodes: Cervical, supraclavicular,  and axillary nodes normal. Resp: clear to auscultation bilaterally Back: symmetric, no curvature. ROM normal. No CVA tenderness. Cardio: regular rate and rhythm, S1, S2 normal, no murmur, click, rub or gallop GI: soft, non-tender; bowel sounds normal; no masses,  no organomegaly Extremities: extremities normal, atraumatic, no cyanosis or edema Neurologic: Alert and oriented X 3, normal strength and tone. Normal symmetric reflexes. Normal coordination and gait  ECOG PERFORMANCE STATUS: 1 - Symptomatic but completely ambulatory  Blood pressure 137/72, pulse 73, temperature (!) 97.3 F (36.3 C), temperature source  Temporal, resp. rate 15, weight 163 lb 1.6 oz (74 kg), SpO2 98 %.  LABORATORY DATA: Lab Results  Component Value Date   WBC 7.8 06/09/2022   HGB 13.2 06/09/2022   HCT 39.3 06/09/2022   MCV 87.9 06/09/2022   PLT 233 06/09/2022      Chemistry      Component Value Date/Time   NA 139 05/29/2022 0826   NA 145 (H) 10/21/2021 1038   K 3.9 05/29/2022 0826   CL 103 05/29/2022 0826   CO2 27 05/29/2022 0826   BUN 24 (H) 05/29/2022 0826   BUN 19 10/21/2021 1038   CREATININE 1.44 (H) 05/29/2022 0826   CREATININE 1.07 08/27/2014 1511      Component Value Date/Time   CALCIUM 9.7 05/29/2022 0826   ALKPHOS 78 05/29/2022 0826   AST 37 05/29/2022 0826   ALT 18 05/29/2022 0826   BILITOT 0.4 05/29/2022 0826   BILITOT <0.2 10/21/2021 1038       RADIOGRAPHIC STUDIES: NM PET Image Initial (PI) Skull Base To Thigh  Result Date: 06/08/2022 CLINICAL DATA:  Initial treatment strategy for perihilar left lung mass. EXAM: NUCLEAR MEDICINE PET SKULL BASE TO THIGH TECHNIQUE: 9.1 mCi F-18 FDG was injected intravenously. Full-ring PET imaging was performed from the skull base to thigh after the radiotracer. CT data was obtained and used for attenuation correction and anatomic localization. Fasting blood glucose: 120 mg/dl COMPARISON:  40/98/1191 chest CT. FINDINGS: Mediastinal blood pool activity: SUV max 2.8 Liver activity: SUV max NA NECK: No hypermetabolic lymph nodes in the neck. Asymmetric hypermetabolism in the posterior right nasopharynx without discrete mass correlate on noncontrast CT images with max SUV 6.7. Incidental CT findings: None. CHEST: Intensely hypermetabolic central perihilar left upper lobe lung mass with max SUV 38.1, poorly delineated on the noncontrast CT images and better visualized on the recent contrast enhanced chest CT study, measuring approximately 3.1 x 1.7 cm (series 4/image 60), with associated occlusion of the apicoposterior segmental left upper lobe bronchus. No additional  hypermetabolic pulmonary findings. Intensely hypermetabolic mildly enlarged 1.3 cm AP window node with max SUV 35.0 (series 4/image 64). No additional enlarged or hypermetabolic mediastinal or hilar nodes. No enlarged or hypermetabolic axillary nodes. Incidental CT findings: Atherosclerotic nonaneurysmal thoracic aorta. Similar mild patchy tree-in-bud opacities in central left upper lobe. No additional significant pulmonary nodules. ABDOMEN/PELVIS: No abnormal hypermetabolic activity within the liver, pancreas, adrenal glands, or spleen. No hypermetabolic lymph nodes in the abdomen or pelvis. Intensely hypermetabolic 1.3 cm lower right retroperitoneal 1.3 cm soft tissue nodule with max SUV 30.7 (series 4/image 139). Incidental CT findings: Hypodense subcentimeter posterior lower left renal cortical lesion, too small to characterize, for which no follow-up imaging is recommended. Atherosclerotic nonaneurysmal abdominal aorta. SKELETON: Intensely hypermetabolic lytic posterior left T8 vertebral and posterior element 2.1 cm lesion with max SUV 43.1 (series 4/image 80). Intensely hypermetabolic L4 vertebral lytic 2.3 cm lesion with max SUV 52.1 (series 4/image 140).1 Incidental CT  findings: None. IMPRESSION: 1. Intensely hypermetabolic central perihilar left upper lobe lung mass with associated occlusion of the apicoposterior segmental left upper lobe bronchus, compatible with primary bronchogenic carcinoma, better delineated on recent 05/12/2022 contrast-enhanced chest CT study. 2. Intensely hypermetabolic mildly enlarged AP window lymph node, compatible with ipsilateral mediastinal nodal metastasis. 3. Intensely hypermetabolic lytic spinal metastases at the T8 and L4 levels. 4. Intensely hypermetabolic 1.3 cm lower right retroperitoneal soft tissue nodule, compatible with soft tissue metastasis. 5. Asymmetric hypermetabolism in the posterior right nasopharynx without discrete mass correlate on noncontrast CT images,  indeterminate, cannot exclude a mucosal lesion. Suggest correlation with direct visualization. 6.  Aortic Atherosclerosis (ICD10-I70.0). Electronically Signed   By: Delbert Phenix M.D.   On: 06/08/2022 15:35   CT PELVIS WO CONTRAST  Result Date: 06/07/2022 CLINICAL DATA:  Suspected lung carcinoma, possible metastatic disease EXAM: CT PELVIS WITHOUT CONTRAST TECHNIQUE: Multidetector CT imaging of the pelvis was performed following the standard protocol without intravenous contrast. RADIATION DOSE REDUCTION: This exam was performed according to the departmental dose-optimization program which includes automated exposure control, adjustment of the mA and/or kV according to patient size and/or use of iterative reconstruction technique. COMPARISON:  PET-CT 06/04/2022 and previous FINDINGS: Urinary Tract:  Urinary bladder is distended. Bowel:  Visualized portions of small bowel and colon are nondilated. Vascular/Lymphatic: Scattered aortoiliac calcified atheromatous plaque without aneurysm. No pelvic adenopathy. Reproductive:  Prostate enlargement. Other:  No pelvic ascites. Musculoskeletal: Facet DJD L4-5 right greater than left. 2.2 cm lytic lesion in the L4 vertebral body, incompletely visualized, corresponding to hypermetabolic focus on PET-CT. No other worrisome bone lesions. Marked narrowing of the L5-S1 interspace with endplate spurring and discogenic sclerosis, with partially calcified protrusion. Mild facet disease bilaterally L5-S1. No fracture or dislocation. IMPRESSION: 1. 2.2 cm lytic lesion in the L4 vertebral body, corresponding to hypermetabolic focus on PET-CT, consistent with metastatic disease. 2.  Aortic Atherosclerosis (ICD10-I70.0). Electronically Signed   By: Corlis Leak M.D.   On: 06/07/2022 09:42   CT Thoracic Spine Wo Contrast  Result Date: 06/07/2022 CLINICAL DATA:  Back pain radiating bilaterally. EXAM: CT THORACIC AND LUMBAR SPINE WITHOUT CONTRAST TECHNIQUE: Multidetector CT imaging of  the thoracic and lumbar spine was performed without contrast. Multiplanar CT image reconstructions were also generated. RADIATION DOSE REDUCTION: This exam was performed according to the departmental dose-optimization program which includes automated exposure control, adjustment of the mA and/or kV according to patient size and/or use of iterative reconstruction technique. COMPARISON:  PET CT from 3 days ago FINDINGS: CT THORACIC SPINE FINDINGS Alignment: Unremarkable Vertebrae: Lytic lesion in the left posterior aspect of the T8 body extending into the pedicle with cortical breakthrough at the level of the body and inferior pedicle cortex. Tumor infiltrates the epidural space and left T8-9 foramen. No visible cord compression. Paraspinal and other soft tissues: Reference recent PET CT. Disc levels: No significant degenerative change. CT LUMBAR SPINE FINDINGS Segmentation: 5 lumbar type vertebrae. Alignment: Normal. Vertebrae: Lytic lesion in the right aspect of the L4 body. No acute fracture. There is no visible extraosseous extension Paraspinal and other soft tissues: Reference recent PET CT Disc levels: Disc space narrowing with endplate and facet spurring especially at L3-4 and below. Disc degeneration at L5-S1 is advanced. Moderate foraminal narrowing at these levels. IMPRESSION: Metastatic lesions in the T8 and L4 vertebrae, more notable at T8 where there is extraosseous extension with left epidural and T8-9 foraminal infiltration. Electronically Signed   By: Tiburcio Pea M.D.   On:  06/07/2022 09:41   CT Lumbar Spine Wo Contrast  Result Date: 06/07/2022 CLINICAL DATA:  Back pain radiating bilaterally. EXAM: CT THORACIC AND LUMBAR SPINE WITHOUT CONTRAST TECHNIQUE: Multidetector CT imaging of the thoracic and lumbar spine was performed without contrast. Multiplanar CT image reconstructions were also generated. RADIATION DOSE REDUCTION: This exam was performed according to the departmental  dose-optimization program which includes automated exposure control, adjustment of the mA and/or kV according to patient size and/or use of iterative reconstruction technique. COMPARISON:  PET CT from 3 days ago FINDINGS: CT THORACIC SPINE FINDINGS Alignment: Unremarkable Vertebrae: Lytic lesion in the left posterior aspect of the T8 body extending into the pedicle with cortical breakthrough at the level of the body and inferior pedicle cortex. Tumor infiltrates the epidural space and left T8-9 foramen. No visible cord compression. Paraspinal and other soft tissues: Reference recent PET CT. Disc levels: No significant degenerative change. CT LUMBAR SPINE FINDINGS Segmentation: 5 lumbar type vertebrae. Alignment: Normal. Vertebrae: Lytic lesion in the right aspect of the L4 body. No acute fracture. There is no visible extraosseous extension Paraspinal and other soft tissues: Reference recent PET CT Disc levels: Disc space narrowing with endplate and facet spurring especially at L3-4 and below. Disc degeneration at L5-S1 is advanced. Moderate foraminal narrowing at these levels. IMPRESSION: Metastatic lesions in the T8 and L4 vertebrae, more notable at T8 where there is extraosseous extension with left epidural and T8-9 foraminal infiltration. Electronically Signed   By: Tiburcio Pea M.D.   On: 06/07/2022 09:41   CT Chest W Contrast  Result Date: 05/12/2022 CLINICAL DATA:  Follow-up left upper lobe bronchial occlusion; * Tracking Code: BO * EXAM: CT CHEST WITH CONTRAST TECHNIQUE: Multidetector CT imaging of the chest was performed during intravenous contrast administration. RADIATION DOSE REDUCTION: This exam was performed according to the departmental dose-optimization program which includes automated exposure control, adjustment of the mA and/or kV according to patient size and/or use of iterative reconstruction technique. CONTRAST:  75mL ISOVUE-370 IOPAMIDOL (ISOVUE-370) INJECTION 76% COMPARISON:  CT dated  March 30, 2022 FINDINGS: Cardiovascular: Normal heart size. No pericardial effusion. Normal caliber thoracic aorta with mild calcified plaque. No suspicious filling defects of the central pulmonary arteries. Mediastinum/Nodes: Esophagus and thyroid are unremarkable. Enlarged AP window lymph node measuring 1.2 cm on series 2, image 87. Lungs/Pleura: Left upper lobe perihilar mass measuring 1.7 x 3.3 cm on series 2, image 70. Secondary occlusion of the left upper lobe bronchus. Numerous small centrilobular nodules of the left upper lobe, likely postobstructive change. Mild paraseptal and centrilobular emphysema. Mild bibasilar linear and nodular opacities, including a new solid pulmonary nodule of the right lower lobe measuring 4 mm on series 3, image 131, likely secondary to aspiration or infection. No pleural effusion or pneumothorax. Upper Abdomen: No acute abnormality. Musculoskeletal: New lytic lesion of the T8 vertebral body with possible involvement of the left foramina. IMPRESSION: 1. Left upper lobe perihilar mass measuring up to 3.3 cm, highly concerning for primary lung malignancy. Lung-RADS 4X, highly suspicious. Additional imaging evaluation or consultation with Pulmonology or Thoracic Surgery recommended. 2. Enlarged AP window lymph node, concerning for nodal metastatic disease. 3. New lytic lesion of the T8 vertebral body, highly concerning for metastatic disease. Possible involvement of the left foramina. This could be further evaluated with contrast MRI of the thoracic spine. 4. Numerous small solid centrilobular nodules of the left upper lobe, likely postobstructive change. 5. Mild bibasilar linear and nodular opacities, including a new solid pulmonary nodule of  the right lower lobe measuring 4 mm. Findings are likely secondary to aspiration or infection. Recommend attention on follow-up. 6. Aortic Atherosclerosis (ICD10-I70.0) and Emphysema (ICD10-J43.9). These results will be called to the  ordering clinician or representative by the Radiologist Assistant, and communication documented in the PACS or [employer]. Electronically Signed   By: Allegra Lai M.D.   On: 05/12/2022 20:49    ASSESSMENT AND PLAN: This is a very pleasant 67 years old African-American male with Stage IV (T2a, N2, M1 B) non-small cell lung cancer, squamous cell carcinoma presented with left upper lobe lung mass in addition to AP window lymphadenopathy and suspicious bone metastasis to the T8 and L4 vertebrae as well as right retroperitoneal metastatic nodule diagnosed in April 2024.  Molecular studies by [id number] showed no actionable mutations and PD-L1 expression of 70%. I had a lengthy discussion with the patient and his wife today about his current disease stage, prognosis and treatment options. I explained to the patient that he has incurable condition and all the treatment will be of palliative nature. I gave the patient the option of palliative care and hospice referral versus consideration of palliative combination of systemic chemotherapy with carboplatin for AUC of 5, paclitaxel 175 Mg/M2 and immunotherapy with Libtayo (Cempilimab) 350 Mg IV every 3 weeks with Neulasta support for 4 cycles followed by maintenance treatment with Libtayo (Cempilimab) of the patient has no disease progression after cycle #4. The patient is interested in treatment and he is expected to start the first cycle of this treatment next week.  I discussed with him the adverse effect of this treatment including but not limited to alopecia, myelosuppression, nausea and vomiting, peripheral neuropathy, liver or renal dysfunction as well as immunotherapy adverse effects. He will have a chemotherapy education class before the first dose of his treatment. I will call his pharmacy with prescription for Compazine 10 mg p.o. every 6 hours as needed for nausea. For the back pain, I will refer the patient to radiation oncology for  consideration of palliative radiotherapy to the T8 and L4 vertebral lesions.  I will also refer the patient to the palliative care team for pain management. For the bone metastasis, I will arrange for the patient to start treatment with Xgeva 120 mg subcutaneously every 6 weeks during his treatment.  He has dentures and no need for dental clearance. I will see the patient back for follow-up visit in around 2 weeks for evaluation and management of any adverse effect of his treatment. The patient was advised to call immediately if he has any other concerning symptoms in the interval. The patient voices understanding of current disease status and treatment options and is in agreement with the current care plan.  All questions were answered. The patient knows to call the clinic with any problems, questions or concerns. We can certainly see the patient much sooner if necessary.  The total time spent in the appointment was 55 minutes.  Disclaimer: This note was dictated with voice recognition software. Similar sounding words can inadvertently be transcribed and may not be corrected upon review.

## 2022-06-10 ENCOUNTER — Other Ambulatory Visit: Payer: Self-pay | Admitting: Internal Medicine

## 2022-06-10 MED ORDER — OXYCODONE HCL 5 MG PO TABS: 5.0000 mg | ORAL_TABLET | Freq: Four times a day (QID) | ORAL | 0 refills | Status: DC | PRN

## 2022-06-10 NOTE — Progress Notes (Signed)
Thoracic Location of Tumor / Histology: Squamous cell carcinoma of lung, stage IV, left   CT CHEST LCS NODULE F/U LOW DOSE WO CONTRAST 03/30/2022  IMPRESSION: 1. Lung-RADS 4A, suspicious. New focal occlusion of the left upper lobe apicoposterior segmental bronchus with new mild patchy tree-in-bud type opacity throughout the posterior left upper lobe. Occlusive endobronchial lesion cannot be excluded, although the differential includes mucus plugging. Suggest further evaluation with chest CT with IV contrast at this time. Alternatively, PET-CT could be obtained for further characterization. 2. Waxing and waning mild patchy tree-in-bud opacities in the posterior lower lobes bilaterally compatible with nonspecific bronchiolitis such as due to recurrent mild aspiration. 3. Aortic Atherosclerosis (ICD10-I70.0) and Emphysema (ICD10-J43.9).  CT Chest W Contrast 05/12/2022  IMPRESSION: 1. Left upper lobe perihilar mass measuring up to 3.3 cm, highly concerning for primary lung malignancy. Lung-RADS 4X, highly suspicious. Additional imaging evaluation or consultation with Pulmonology or Thoracic Surgery recommended. 2. Enlarged AP window lymph node, concerning for nodal metastatic disease. 3. New lytic lesion of the T8 vertebral body, highly concerning for metastatic disease. Possible involvement of the left foramina. This could be further evaluated with contrast MRI of the thoracic spine. 4. Numerous small solid centrilobular nodules of the left upper lobe, likely postobstructive change. 5. Mild bibasilar linear and nodular opacities, including a new solid pulmonary nodule of the right lower lobe measuring 4 mm. Findings are likely secondary to aspiration or infection. Recommend attention on follow-up. 6. Aortic Atherosclerosis (ICD10-I70.0) and Emphysema (ICD10-J43.9).  NM PET Image Initial (PI) Skull Base To Thigh 06/04/2022  IMPRESSION: 1. Intensely hypermetabolic central perihilar  left upper lobe lung mass with associated occlusion of the apicoposterior segmental left upper lobe bronchus, compatible with primary bronchogenic carcinoma, better delineated on recent 05/12/2022 contrast-enhanced chest CT study. 2. Intensely hypermetabolic mildly enlarged AP window lymph node, compatible with ipsilateral mediastinal nodal metastasis. 3. Intensely hypermetabolic lytic spinal metastases at the T8 and L4 levels. 4. Intensely hypermetabolic 1.3 cm lower right retroperitoneal soft tissue nodule, compatible with soft tissue metastasis. 5. Asymmetric hypermetabolism in the posterior right nasopharynx without discrete mass correlate on noncontrast CT images, indeterminate, cannot exclude a mucosal lesion. Suggest correlation with direct visualization. 6.  Aortic Atherosclerosis (ICD10-I70.0).  Patient presented with symptoms of:  abnormal lung cancer screening   Biopsies revealed:  05-26-22 FINAL MICROSCOPIC DIAGNOSIS:  A. LUNG, LUL, BRUSHING:  - Non-small cell carcinoma, see part B.   B. LUNG, LUL, ENDOBRONCHIAL BIOPSY:  - Poorly differentiated squamous cell carcinoma   Note: Immunohistochemical stains were performed on the cellblock and the  cells of interest are positive for p63 and CK5/6 and are negative for  TTF-1, Napsin, CK7, CD56 and synaptophysin.  The proliferation rate  difficult to interpret given the extensive crush artifact.  The above  findings are consistent with a poorly differentiated squamous cell  carcinoma.   SPECIMEN ADEQUACY:  A. Satisfactory for Evaluation  B. Satisfactory for Evaluation    Tobacco/Marijuana/Snuff/ETOH use:    Past/Anticipated interventions by cardiothoracic surgery, if any:  Video Bronchoscopy Procedure Note   Date of Operation: 05/26/2022   Pre-op Diagnosis: Left upper lobe mass   Post-op Diagnosis: Left upper lobe mass   Surgeon: Josephine Igo, DO   Past/Anticipated interventions by medical oncology, if any:   Dr. 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  Molecular studies by [id number] showed no actionable mutations and PD-L1 expression of 70%. I had a lengthy discussion with the patient and his wife today about his current disease stage, prognosis and treatment options. I explained to the patient that he has incurable condition and all the treatment will be of palliative nature. I gave the patient the option of palliative care and hospice referral versus consideration of palliative combination of systemic chemotherapy with carboplatin for AUC of 5, paclitaxel 175 Mg/M2 and immunotherapy with Libtayo (Cempilimab) 350 Mg IV every 3 weeks with Neulasta support for 4 cycles followed by maintenance treatment with Libtayo (Cempilimab) of the patient has no disease progression after cycle #4. The patient is interested in treatment and he is expected to start the first cycle of this treatment next week.  I discussed with him the adverse effect of this treatment including but not limited to alopecia, myelosuppression, nausea and vomiting, peripheral neuropathy, liver or renal dysfunction as well as immunotherapy adverse effects. He will have a chemotherapy education class before the first dose of his treatment. I will call his pharmacy with prescription for Compazine 10 mg p.o. every 6 hours as needed for nausea. For the back pain, I will refer the patient to radiation oncology for consideration of palliative radiotherapy to the T8 and L4 vertebral lesions.  I will also refer the patient to the palliative care team for pain management. For the bone metastasis, I will  arrange for the patient to start treatment with Xgeva 120 mg subcutaneously every 6 weeks during his treatment.  He has dentures and no need for dental clearance. I will see the patient back for follow-up visit in around 2 weeks for evaluation and management of any adverse effect of his treatment. The patient was advised to call immediately if he has any other concerning symptoms in the interval.  Signs/Symptoms Weight changes, if any: Weight  loss 20 lbs. No appetite Respiratory complaints, if any:  No Hemoptysis, if any: No Pain issues, if any:  0/10  SAFETY ISSUES: Prior radiation? No Pacemaker/ICD?  No Possible current pregnancy? na Is the patient on methotrexate? no  Current Complaints / other details:  (Per Dr. Asa Lente note on 05-29-22)  Family history significant for mother who had colon cancer at age 41 and she still alive at age 71.  Father died in a car accident and had history of hypertension.  He had brother who has multiple myeloma. The patient is married and has 1 son age 56.  He used to work in [employer].  He has a history for smoking for around 40 years and quit in 2013 but now vaping.  He drinks alcohol on weekends and he also smokes marijuana at regular basis.

## 2022-06-11 ENCOUNTER — Telehealth: Payer: Self-pay | Admitting: Internal Medicine

## 2022-06-11 ENCOUNTER — Other Ambulatory Visit: Payer: Self-pay

## 2022-06-11 ENCOUNTER — Ambulatory Visit: Payer: Medicare Other

## 2022-06-11 NOTE — Telephone Encounter (Signed)
Scheduled per 04/30 los, patient has been called and notified of upcoming appointment.

## 2022-06-12 ENCOUNTER — Encounter (HOSPITAL_COMMUNITY): Payer: Self-pay

## 2022-06-12 ENCOUNTER — Telehealth: Payer: Self-pay

## 2022-06-12 NOTE — Telephone Encounter (Signed)
Transition Care Management Unsuccessful Follow-up Telephone Call  Date of discharge and from where:  06/07/2022, [facility]  Attempts:  1st Attempt  Reason for unsuccessful TCM follow-up call:  Left voice message  Krystian Ferrentino [facility]  [facility] Population Health Community Resource Care Guide   [email or web address]  ?? [phone]   Website: [email or web address]  [email or web address]

## 2022-06-15 ENCOUNTER — Telehealth: Payer: Self-pay

## 2022-06-15 ENCOUNTER — Inpatient Hospital Stay: Payer: Medicare Other

## 2022-06-15 DIAGNOSIS — C7951 Secondary malignant neoplasm of bone: Secondary | ICD-10-CM | POA: Insufficient documentation

## 2022-06-15 DIAGNOSIS — Z79899 Other long term (current) drug therapy: Secondary | ICD-10-CM | POA: Insufficient documentation

## 2022-06-15 DIAGNOSIS — Z5189 Encounter for other specified aftercare: Secondary | ICD-10-CM | POA: Insufficient documentation

## 2022-06-15 DIAGNOSIS — C3492 Malignant neoplasm of unspecified part of left bronchus or lung: Secondary | ICD-10-CM

## 2022-06-15 DIAGNOSIS — Z87891 Personal history of nicotine dependence: Secondary | ICD-10-CM | POA: Insufficient documentation

## 2022-06-15 DIAGNOSIS — C3412 Malignant neoplasm of upper lobe, left bronchus or lung: Secondary | ICD-10-CM | POA: Insufficient documentation

## 2022-06-15 DIAGNOSIS — Z510 Encounter for antineoplastic radiation therapy: Secondary | ICD-10-CM | POA: Insufficient documentation

## 2022-06-15 DIAGNOSIS — Z5111 Encounter for antineoplastic chemotherapy: Secondary | ICD-10-CM | POA: Insufficient documentation

## 2022-06-15 DIAGNOSIS — Z5112 Encounter for antineoplastic immunotherapy: Secondary | ICD-10-CM | POA: Insufficient documentation

## 2022-06-15 LAB — CBC WITH DIFFERENTIAL (CANCER CENTER ONLY)
Abs Immature Granulocytes: 0 10*3/uL (ref 0.00–0.07)
Basophils Absolute: 0 10*3/uL (ref 0.0–0.1)
Basophils Relative: 0 %
Eosinophils Absolute: 0 10*3/uL (ref 0.0–0.5)
Eosinophils Relative: 0 %
HCT: 42.5 % (ref 39.0–52.0)
Hemoglobin: 14.2 g/dL (ref 13.0–17.0)
Immature Granulocytes: 0 %
Lymphocytes Relative: 32 %
Lymphs Abs: 1.9 10*3/uL (ref 0.7–4.0)
MCH: 29.5 pg (ref 26.0–34.0)
MCHC: 33.4 g/dL (ref 30.0–36.0)
MCV: 88.4 fL (ref 80.0–100.0)
Monocytes Absolute: 0.4 10*3/uL (ref 0.1–1.0)
Monocytes Relative: 7 %
Neutro Abs: 3.5 10*3/uL (ref 1.7–7.7)
Neutrophils Relative %: 61 %
Platelet Count: 303 10*3/uL (ref 150–400)
RBC: 4.81 MIL/uL (ref 4.22–5.81)
RDW: 12.8 % (ref 11.5–15.5)
WBC Count: 5.8 10*3/uL (ref 4.0–10.5)
nRBC: 0 % (ref 0.0–0.2)

## 2022-06-15 LAB — CMP (CANCER CENTER ONLY)
ALT: 15 U/L (ref 0–44)
AST: 18 U/L (ref 15–41)
Albumin: 4.5 g/dL (ref 3.5–5.0)
Alkaline Phosphatase: 79 U/L (ref 38–126)
Anion gap: 11 (ref 5–15)
BUN: 27 mg/dL — ABNORMAL HIGH (ref 8–23)
CO2: 27 mmol/L (ref 22–32)
Calcium: 10.2 mg/dL (ref 8.9–10.3)
Chloride: 100 mmol/L (ref 98–111)
Creatinine: 1.35 mg/dL — ABNORMAL HIGH (ref 0.61–1.24)
GFR, Estimated: 58 mL/min — ABNORMAL LOW (ref >60)
Glucose, Bld: 135 mg/dL — ABNORMAL HIGH (ref 70–99)
Potassium: 4.1 mmol/L (ref 3.5–5.1)
Sodium: 138 mmol/L (ref 135–145)
Total Bilirubin: 0.4 mg/dL (ref 0.3–1.2)
Total Protein: 8.8 g/dL — ABNORMAL HIGH (ref 6.5–8.1)

## 2022-06-15 NOTE — Telephone Encounter (Signed)
Transition Care Management Follow-up Telephone Call Date of discharge and from where: 06/07/2022 [facility] How have you been since you were released from the hospital? Patient stated he is feeling a little better. Any questions or concerns? No  Items Reviewed: Did the pt receive and understand the discharge instructions provided?  Patient stated he did not receive any paperwork. Directed patient to MyChart. Medications obtained and verified? Yes  Other? No  Any new allergies since your discharge? No  Dietary orders reviewed? Yes Do you have support at home? Yes   Follow up appointments reviewed:  PCP Hospital f/u appt confirmed?  Patient is receiving chemo treatment  Scheduled to see  on  @ . Specialist Hospital f/u appt confirmed?  Patient is receiving chemo treatment  Scheduled to see  on  @ . Are transportation arrangements needed? No  If their condition worsens, is the pt aware to call PCP or go to the Emergency Dept.? Yes Was the patient provided with contact information for the PCP's office or ED? Yes Was to pt encouraged to call back with questions or concerns? Yes  Schae Cando [facility]  [facility] Population Health Community Resource Care Guide   [email or web address]  ?? [phone]   Website: [email or web address]  [email or web address]

## 2022-06-16 ENCOUNTER — Encounter: Payer: Self-pay | Admitting: Internal Medicine

## 2022-06-16 ENCOUNTER — Telehealth: Payer: Self-pay

## 2022-06-16 MED FILL — Dexamethasone Sodium Phosphate Inj 100 MG/10ML: INTRAMUSCULAR | Qty: 1 | Status: AC

## 2022-06-16 MED FILL — Fosaprepitant Dimeglumine For IV Infusion 150 MG (Base Eq): INTRAVENOUS | Qty: 5 | Status: AC

## 2022-06-16 NOTE — Progress Notes (Unsigned)
Palliative Medicine [facility]  Telephone:([phone]) [phone] Fax:(336) [phone]   Name: Albert Hardy. Date: 06/16/2022 MRN: [id number]  DOB: May 12, 1955  Patient Care Team: [name], MD as PCP - General (Family Medicine)    REASON FOR CONSULTATION: Albert Hardy. is a 67 y.o. 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 Palliative ask to see for symptom management and goals of care.    SOCIAL HISTORY:     reports that he quit smoking about 10 years ago. His smoking use included cigarettes. He has a 30.00 pack-year smoking history. He has never used smokeless tobacco. He reports current alcohol use. He reports current drug use. Frequency: 1.00 time per week. Drug: Marijuana.  ADVANCE DIRECTIVES:  None on file  CODE STATUS: Full code  PAST MEDICAL HISTORY: Past Medical History:  Diagnosis Date   Aortic regurgitation    Aortic stenosis 04/04/2018   Atherosclerosis of native arteries of the extremities with intermittent claudication 09/08/2013   Bilateral impacted cerumen 10/13/2018   Bilateral sensorineural hearing loss 12/14/2018   Dyspnea    Former moderate cigarette smoker (10-19 per day) 05/20/2012   Quit smoking Nov 2013 when hospitalized w/ HTN and chest pain(diagnosed w/ valvular heart disease)   GERD (gastroesophageal reflux disease)    Heart murmur    Hyperlipidemia    Hypertension    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   Peripheral vascular disease with claudication    ABI .49     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   Substance abuse (HCC)     PAST SURGICAL HISTORY:  Past Surgical History:  Procedure Laterality Date   BRONCHIAL BIOPSY  05/26/2022   Procedure: BRONCHIAL BIOPSIES;  Surgeon: Josephine Igo, DO;  Location: [facility] ENDOSCOPY;  Service: Pulmonary;;   BRONCHIAL BRUSHINGS  05/26/2022   Procedure: BRONCHIAL BRUSHINGS;  Surgeon: Josephine Igo, DO;   Location: [facility] ENDOSCOPY;  Service: Pulmonary;;   NO PAST SURGERIES     VIDEO BRONCHOSCOPY  05/26/2022   Procedure: VIDEO BRONCHOSCOPY WITHOUT FLUORO;  Surgeon: Josephine Igo, DO;  Location: [facility] ENDOSCOPY;  Service: Pulmonary;;    HEMATOLOGY/ONCOLOGY HISTORY:  Oncology History  Squamous cell carcinoma of lung, stage IV, left (HCC)  05/29/2022 Initial Diagnosis   Squamous cell carcinoma of lung, stage IV, left   05/29/2022 Cancer Staging   Staging form: Lung, AJCC 8th Edition - Clinical: Stage IVA Ileana Ladd, cN2, cM1b) - Signed by Si Gaul, MD on 05/29/2022   06/16/2022 -  Chemotherapy   Patient is on Treatment Plan : LUNG Carboplatin + Paclitaxel q21d Dose Reduction       ALLERGIES:  is allergic to lipitor [atorvastatin] and pravastatin.  MEDICATIONS:  Current Outpatient Medications  Medication Sig Dispense Refill   albuterol (VENTOLIN HFA) 108 (90 Base) MCG/ACT inhaler Inhale 2 puffs into the lungs every 6 (six) hours as needed for wheezing or shortness of breath. 8 g 6   Ascorbic Acid (VITAMIN C PO) Take 1 tablet by mouth daily.     Cholecalciferol (VITAMIN D3) 1000 UNITS CAPS Take 1,000 Units by mouth daily.     Coenzyme Q10 (CO Q 10 PO) Take 1 tablet by mouth daily.     Cyanocobalamin (VITAMIN B-12 PO) Take 1 tablet by mouth daily.     docusate sodium (COLACE) 100 MG capsule Take 1 capsule (100 mg total) by mouth every 12 (twelve) hours. 60 capsule 0   ezetimibe (ZETIA) 10 MG  tablet Take 1 tablet (10 mg total) by mouth daily. 90 tablet 3   fluticasone (FLONASE) 50 MCG/ACT nasal spray Place 1-2 sprays into both nostrils daily. (Patient taking differently: Place 1-2 sprays into both nostrils daily as needed for allergies.) 16 g 6   ibuprofen (ADVIL) 200 MG tablet Take 400 mg by mouth every 6 (six) hours as needed for moderate pain.     KRILL OIL PO Take 1 tablet by mouth daily.     lisinopril-hydrochlorothiazide (ZESTORETIC) 20-12.5 MG tablet Take 2 tablets by mouth once daily 180  tablet 0   MAGNESIUM PO Take 1 tablet by mouth daily.     methocarbamol (ROBAXIN) 500 MG tablet Take 2 tablets (1,000 mg total) by mouth 4 (four) times daily. 30 tablet 0   Multiple Vitamins-Minerals (CENTRUM SILVER PO) Take 1 tablet by mouth daily.     Multiple Vitamins-Minerals (ZINC PO) Take 1 tablet by mouth daily.     Naphazoline-Pheniramine (OPCON-A) 0.027-0.315 % SOLN Place 1 drop into both eyes daily as needed (redness).     nicotine (NICODERM CQ - DOSED IN MG/24 HOURS) 14 mg/24hr patch 14mg /day for 6 weeks then 7mg /day x 2 weeks 28 patch 1   omeprazole (PRILOSEC) 20 MG capsule Take 1 capsule by mouth once daily 90 capsule 0   oxyCODONE (OXY IR/ROXICODONE) 5 MG immediate release tablet Take 1 tablet (5 mg total) by mouth every 6 (six) hours as needed for severe pain. 20 tablet 0   prochlorperazine (COMPAZINE) 10 MG tablet Take 1 tablet (10 mg total) by mouth every 6 (six) hours as needed for nausea or vomiting. 30 tablet 0   rosuvastatin (CRESTOR) 5 MG tablet TAKE 1 TABLET BY MOUTH AT BEDTIME 90 tablet 0   TURMERIC PO Take 1 tablet by mouth 3 (three) times a week.     umeclidinium-vilanterol (ANORO ELLIPTA) 62.5-25 MCG/ACT AEPB Inhale 1 puff into the lungs daily. (Patient taking differently: Inhale 1 puff into the lungs daily as needed (shortness of breath).) 180 each 3   No current facility-administered medications for this visit.    VITAL SIGNS: There were no vitals taken for this visit. There were no vitals filed for this visit.  Estimated body mass index is 24.09 kg/m as calculated from the following:   Height as of 06/07/22: 5\' 9"  (1.753 m).   Weight as of 06/09/22: 163 lb 1.6 oz (74 kg).  LABS: CBC:    Component Value Date/Time   WBC 5.8 06/15/2022 1322   WBC 6.1 05/29/2022 0826   HGB 14.2 06/15/2022 1322   HCT 42.5 06/15/2022 1322   PLT 303 06/15/2022 1322   MCV 88.4 06/15/2022 1322   NEUTROABS 3.5 06/15/2022 1322   LYMPHSABS 1.9 06/15/2022 1322   MONOABS 0.4  06/15/2022 1322   EOSABS 0.0 06/15/2022 1322   BASOSABS 0.0 06/15/2022 1322   Comprehensive Metabolic Panel:    Component Value Date/Time   NA 138 06/15/2022 1322   NA 145 (H) 10/21/2021 1038   K 4.1 06/15/2022 1322   CL 100 06/15/2022 1322   CO2 27 06/15/2022 1322   BUN 27 (H) 06/15/2022 1322   BUN 19 10/21/2021 1038   CREATININE 1.35 (H) 06/15/2022 1322   CREATININE 1.07 08/27/2014 1511   GLUCOSE 135 (H) 06/15/2022 1322   CALCIUM 10.2 06/15/2022 1322   AST 18 06/15/2022 1322   ALT 15 06/15/2022 1322   ALKPHOS 79 06/15/2022 1322   BILITOT 0.4 06/15/2022 1322   PROT 8.8 (H) 06/15/2022 1322  PROT 7.1 10/21/2021 1038   ALBUMIN 4.5 06/15/2022 1322   ALBUMIN 4.4 10/21/2021 1038    RADIOGRAPHIC STUDIES: MR BRAIN W WO CONTRAST  Result Date: 06/09/2022 CLINICAL DATA:  Non-small cell lung cancer (NSCLC), staging. EXAM: MRI HEAD WITHOUT AND WITH CONTRAST TECHNIQUE: Multiplanar, multiecho pulse sequences of the brain and surrounding structures were obtained without and with intravenous contrast. CONTRAST:  7.26mL GADAVIST GADOBUTROL 1 MMOL/ML IV SOLN COMPARISON:  None Available. FINDINGS: Brain: No acute infarct or hemorrhage. Mild chronic small-vessel disease. No hydrocephalus or extra-axial collection. No foci of abnormal susceptibility. No mass or abnormal enhancement. Vascular: Normal flow voids and enhancement. Skull and upper cervical spine: Normal marrow signal and enhancement. Sinuses/Orbits: Unremarkable. Other: None. IMPRESSION: No evidence of metastatic disease to the brain. Electronically Signed   By: Orvan Falconer M.D.   On: 06/09/2022 15:40   NM PET Image Initial (PI) Skull Base To Thigh  Result Date: 06/08/2022 CLINICAL DATA:  Initial treatment strategy for perihilar left lung mass. EXAM: NUCLEAR MEDICINE PET SKULL BASE TO THIGH TECHNIQUE: 9.1 mCi F-18 FDG was injected intravenously. Full-ring PET imaging was performed from the skull base to thigh after the radiotracer. CT  data was obtained and used for attenuation correction and anatomic localization. Fasting blood glucose: 120 mg/dl COMPARISON:  16/11/9602 chest CT. FINDINGS: Mediastinal blood pool activity: SUV max 2.8 Liver activity: SUV max NA NECK: No hypermetabolic lymph nodes in the neck. Asymmetric hypermetabolism in the posterior right nasopharynx without discrete mass correlate on noncontrast CT images with max SUV 6.7. Incidental CT findings: None. CHEST: Intensely hypermetabolic central perihilar left upper lobe lung mass with max SUV 38.1, poorly delineated on the noncontrast CT images and better visualized on the recent contrast enhanced chest CT study, measuring approximately 3.1 x 1.7 cm (series 4/image 60), with associated occlusion of the apicoposterior segmental left upper lobe bronchus. No additional hypermetabolic pulmonary findings. Intensely hypermetabolic mildly enlarged 1.3 cm AP window node with max SUV 35.0 (series 4/image 64). No additional enlarged or hypermetabolic mediastinal or hilar nodes. No enlarged or hypermetabolic axillary nodes. Incidental CT findings: Atherosclerotic nonaneurysmal thoracic aorta. Similar mild patchy tree-in-bud opacities in central left upper lobe. No additional significant pulmonary nodules. ABDOMEN/PELVIS: No abnormal hypermetabolic activity within the liver, pancreas, adrenal glands, or spleen. No hypermetabolic lymph nodes in the abdomen or pelvis. Intensely hypermetabolic 1.3 cm lower right retroperitoneal 1.3 cm soft tissue nodule with max SUV 30.7 (series 4/image 139). Incidental CT findings: Hypodense subcentimeter posterior lower left renal cortical lesion, too small to characterize, for which no follow-up imaging is recommended. Atherosclerotic nonaneurysmal abdominal aorta. SKELETON: Intensely hypermetabolic lytic posterior left T8 vertebral and posterior element 2.1 cm lesion with max SUV 43.1 (series 4/image 80). Intensely hypermetabolic L4 vertebral lytic 2.3 cm  lesion with max SUV 52.1 (series 4/image 140).1 Incidental CT findings: None. IMPRESSION: 1. Intensely hypermetabolic central perihilar left upper lobe lung mass with associated occlusion of the apicoposterior segmental left upper lobe bronchus, compatible with primary bronchogenic carcinoma, better delineated on recent 05/12/2022 contrast-enhanced chest CT study. 2. Intensely hypermetabolic mildly enlarged AP window lymph node, compatible with ipsilateral mediastinal nodal metastasis. 3. Intensely hypermetabolic lytic spinal metastases at the T8 and L4 levels. 4. Intensely hypermetabolic 1.3 cm lower right retroperitoneal soft tissue nodule, compatible with soft tissue metastasis. 5. Asymmetric hypermetabolism in the posterior right nasopharynx without discrete mass correlate on noncontrast CT images, indeterminate, cannot exclude a mucosal lesion. Suggest correlation with direct visualization. 6.  Aortic Atherosclerosis (ICD10-I70.0). Electronically Signed  By: Delbert Phenix M.D.   On: 06/08/2022 15:35   PERFORMANCE STATUS (ECOG) : {CHL ONC ECOG VW:[id number]}  Review of Systems Unless otherwise noted, a complete review of systems is negative.  Physical Exam General: NAD Cardiovascular: regular rate and rhythm Pulmonary: clear ant fields Abdomen: soft, nontender, + bowel sounds Extremities: no edema, no joint deformities Skin: no rashes Neurological:  IMPRESSION: *** I introduced myself, Maygan RN, and Palliative's role in collaboration with the oncology team. Concept of Palliative Care was introduced as specialized medical care for people and their families living with serious illness.  It focuses on providing relief from the symptoms and stress of a serious illness.  The goal is to improve quality of life for both the patient and the family. Values and goals of care important to patient and family were attempted to be elicited.    We discussed *** current illness and what it means in the  larger context of *** on-going co-morbidities. Natural disease trajectory and expectations were discussed.  I discussed the importance of continued conversation with family and their medical providers regarding overall plan of care and treatment options, ensuring decisions are within the context of the patients values and GOCs.  PLAN: Established therapeutic relationship. Education provided on palliative's role in collaboration with their Oncology/Radiation team. I will plan to see patient back in 2-4 weeks in collaboration to other oncology appointments.    Patient expressed understanding and was in agreement with this plan. He also understands that He can call the clinic at any time with any questions, concerns, or complaints.   Thank you for your referral and allowing Palliative to assist in Mr. Adan Dewaard Jr.'s care.   Number and complexity of problems addressed: ***HIGH - 1 or more chronic illnesses with SEVERE exacerbation, progression, or side effects of treatment - advanced cancer, pain. Any controlled substances utilized were prescribed in the context of palliative care.   Visit consisted of counseling and education dealing with the complex and emotionally intense issues of symptom management and palliative care in the setting of serious and potentially life-threatening illness.Greater than 50%  of this time was spent counseling and coordinating care related to the above assessment and plan.  Signed by: Willette Alma, AGPCNP-BC Palliative Medicine Team/[facility]   *Please note that this is a verbal dictation therefore any spelling or grammatical errors are due to the "[employer]agon [employer] One" system interpretation.

## 2022-06-16 NOTE — Progress Notes (Signed)
Called pt to introduce myself as his Dance movement psychotherapist and to discuss the [employer].  He gave the phone to his wife so I went over what the grant covers but she stated her income will exceed the income requirement which it did so he doesn't qualify for the grant at this time.

## 2022-06-16 NOTE — Progress Notes (Signed)
Pharmacist Chemotherapy Monitoring - Initial Assessment    Anticipated start date: 06/17/22   The following has been reviewed per standard work regarding the patient's treatment regimen: The patient's diagnosis, treatment plan and drug doses, and organ/hematologic function Lab orders and baseline tests specific to treatment regimen  The treatment plan start date, drug sequencing, and pre-medications Prior authorization status  Patient's documented medication list, including drug-drug interaction screen and prescriptions for anti-emetics and supportive care specific to the treatment regimen The drug concentrations, fluid compatibility, administration routes, and timing of the medications to be used The patient's access for treatment and lifetime cumulative dose history, if applicable  The patient's medication allergies and previous infusion related reactions, if applicable   Changes made to treatment plan:  N/A  Follow up needed:  N/A   Ebony Hail, Pharm.D., CPP 06/16/2022@11 :47 AM

## 2022-06-16 NOTE — Progress Notes (Signed)
Radiation Oncology         [phone]) [phone] ________________________________  Initial Outpatient Consultation  Name: Albert Hardy. MRN: [id number]  Date: 06/17/2022  DOB: [birth year]  VH:QIONGE, Asencion Partridge, MD  Si Gaul, MD   REFERRING PHYSICIAN: Si Gaul, MD  DIAGNOSIS: There were no encounter diagnoses.  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    Cancer Staging  Squamous cell carcinoma of lung, stage IV, left (HCC) Staging form: Lung, AJCC 8th Edition - Clinical: Stage IVA (cT2a, cN2, cM1b) - Signed by Si Gaul, MD on 05/29/2022  HISTORY OF PRESENT ILLNESS::Albert Hardy. is a 67 y.o. male who is accompanied by ***. he is seen as a courtesy of Dr. Arbutus Ped for an opinion concerning radiation therapy as part of management for his recently diagnosed T8 and L4 osseous metastatic disease from a left lung cancer primary. The patient has an extensive smoking history and follows with [facility]. He also participated in the lung cancer screening program.   On 03/30/2022, the patient presented for a lung cancer screening CT which showed new focal occlusion of the left upper lobe apicoposterior segmental bronchus, along with a new mild patchy tree-in-bud type opacity throughout the posterior left upper lobe (Lung-RADS 4A, suspicious). CT also demonstrated waxing and waning mild patchy tree-in-bud opacities in the posterior lower lobes bilaterally, compatible with nonspecific bronchiolitis such recurrent mild aspiration. Of note: Resolution of patchy tree-in-bud opacities in the posteromedial basilar right lower lobe were also noted. This finding was first noted on his  previous lung cancer screening CT performed in August 2023.  He accordingly followed up with with pulmonary medicine who recommended proceeding with a repeat chest with without contrast for further evaluation of screening CT findings. Subsequent chest CT with contrast on 05/12/22 redemonstrated the LUL perihilar mass with features highly concerning for malignancy, measuring up to 3.3 cm. CT also revealed: an enlarged AP window lymph node, concerning for nodal metastatic disease; a new lytic lesion of the T8 vertebral body with possible involvement of the left foramina, highly concerning for metastatic disease; numerous small solid centrilobular nodules of the left upper love likely related postobstructive changes; and mild bibasilar linear and nodular opacities, including a new solid pulmonary nodule of the right lower lobe measuring 4 mm (likely secondary to aspiration or infection).   Subsequently, the patient proceeded with bronchoscopy with biopsies of the LUL on 05/26/22 under the care of Dr. Tonia Brooms. LUL endobrachial biopsy collected showed findings consistent with poorly differentiated squamous cell carcinoma. LUL brushings were also sent for cytology and showed findings consistent with non-small cell carcinoma.    -- Molecular studies: Guardant360 testing was ordered on the biopsy specimen revealing a TPS score of 70%; MSI stable.   The patient was then referred to Dr. Arbutus Ped on 05/29/22 for further management. During this visit, the patient denied any symptoms other than exertional dyspnea and a cough productive of clear sputum. For treatment planning, Dr. Arbutus Ped recommended proceeding with staging work-up and molecular studies (noted above).   Staging workup is detailed as follows:  -- PET scan on 06/04/22 demonstrated: the intensely hypermetabolic central perihilar left upper lobe lung mass with associated occlusion of the apicoposterior segmental left upper lobe  bronchus, compatible with  primary bronchogenic carcinoma; an intensely hypermetabolic mildly enlarged AP window lymph node, compatible with ipsilateral mediastinal nodal metastasis; intensely hypermetabolic lytic spinal metastases at the T8 and L4 levels; an intensely hypermetabolic 1.3 cm lower right retroperitoneal soft tissue nodule, compatible with soft tissue metastasis; and an indeterminate asymmetric hypermetabolism in the posterior right nasopharynx without discrete mass correlate on noncontrast CT images.  -- MRI of the brain on 06/08/22 showed no evidence of intracranial metastatic disease.   The patient recently presented to the ED on 06/07/22 with c/o severe lower back and sacral pain that began the night prior. He also endorsed pain radiating down into his right leg, and difficulties with ambulation secondary to his pain. CT of the thoracic and lumbar spine performed in the ED demonstrated metastatic lesions in the T8 and L4 vertebrae, more notable at T8 where extraosseous extension with left epidural and T8-9 foraminal infiltration was appreciated. CT of the pelvis also performed redemonstrated the 2.2 cm lytic lesion in the L4 vertebral body, corresponding to the hypermetabolic focus noted on PET-CT, consistent with metastatic disease. ED course included IV Dilaudid, and he was discharged with prescriptions for Oxycodone, Colace, and Robaxin.   The patient most recently followed up with Dr. Arbutus Ped on 06/09/22. The option of palliative care and hospice referral were reviewed with the patient, versus consideration of palliative combination systemic chemotherapy with carboplatin, paclitaxel, and immunotherapy with Libtayo (Cempilimab) every 3 weeks with Neulasta support for 4 cycles, followed by maintenance treatment with Libtayo (Cempilimab) if the patient has no disease progression after cycle #4.   The patient is interested in proceeding with systemic treatment along with palliative radiation to the osseous metastases  to T8 and L4 which we will discuss in detail today. Dr. Arbutus Ped will also start him on Xgeva at 120 mg q6 weeks for his bony disease. A referral has also been placed to palliative medicine for pain management.     PREVIOUS RADIATION THERAPY: No  PAST MEDICAL HISTORY:  Past Medical History:  Diagnosis Date   Aortic regurgitation    Aortic stenosis 04/04/2018   Atherosclerosis of native arteries of the extremities with intermittent claudication 09/08/2013   Bilateral impacted cerumen 10/13/2018   Bilateral sensorineural hearing loss 12/14/2018   Dyspnea    Former moderate cigarette smoker (10-19 per day) 05/20/2012   Quit smoking Nov 2013 when hospitalized w/ HTN and chest pain(diagnosed w/ valvular heart disease)   GERD (gastroesophageal reflux disease)    Heart murmur    Hyperlipidemia    Hypertension    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   Peripheral vascular disease with claudication    ABI .49     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   Substance abuse (HCC)     PAST SURGICAL HISTORY: Past Surgical History:  Procedure Laterality Date   BRONCHIAL BIOPSY  05/26/2022   Procedure: BRONCHIAL BIOPSIES;  Surgeon: Josephine Igo, DO;  Location: [facility];  Service: Pulmonary;;   BRONCHIAL BRUSHINGS  05/26/2022   Procedure: BRONCHIAL BRUSHINGS;  Surgeon: Josephine Igo, DO;  Location: [facility];  Service: Pulmonary;;   NO PAST SURGERIES     VIDEO BRONCHOSCOPY  05/26/2022   Procedure: VIDEO BRONCHOSCOPY WITHOUT FLUORO;  Surgeon: Josephine Igo, DO;  Location: [facility];  Service: Pulmonary;;    FAMILY HISTORY:  Family History  Problem Relation Age of Onset   Other Brother    Hypertension Brother    Hyperlipidemia Brother    Arthritis Mother  Hypertension Mother    Hyperlipidemia Mother    Colon cancer Mother        62's   Hypertension Sister    Hyperlipidemia Sister    Diabetes Sister    Heart attack Father    Hyperlipidemia Brother     SOCIAL  HISTORY:  Social History   Tobacco Use   Smoking status: Former    Packs/day: 1.00    Years: 30.00    Additional pack years: 0.00    Total pack years: 30.00    Types: Cigarettes    Quit date: 12/21/2011    Years since quitting: 10.4   Smokeless tobacco: Never  Vaping Use   Vaping Use: Every day  Substance Use Topics   Alcohol use: Yes    Comment: Ocassionaly.    Drug use: Yes    Frequency: 1.0 times per week    Types: Marijuana    ALLERGIES:  Allergies  Allergen Reactions   Lipitor [Atorvastatin]     myalgia   Pravastatin Other (See Comments)    myalgia    MEDICATIONS:  Current Outpatient Medications  Medication Sig Dispense Refill   albuterol (VENTOLIN HFA) 108 (90 Base) MCG/ACT inhaler Inhale 2 puffs into the lungs every 6 (six) hours as needed for wheezing or shortness of breath. 8 g 6   Ascorbic Acid (VITAMIN C PO) Take 1 tablet by mouth daily.     Cholecalciferol (VITAMIN D3) 1000 UNITS CAPS Take 1,000 Units by mouth daily.     Coenzyme Q10 (CO Q 10 PO) Take 1 tablet by mouth daily.     Cyanocobalamin (VITAMIN B-12 PO) Take 1 tablet by mouth daily.     docusate sodium (COLACE) 100 MG capsule Take 1 capsule (100 mg total) by mouth every 12 (twelve) hours. 60 capsule 0   ezetimibe (ZETIA) 10 MG tablet Take 1 tablet (10 mg total) by mouth daily. 90 tablet 3   fluticasone (FLONASE) 50 MCG/ACT nasal spray Place 1-2 sprays into both nostrils daily. (Patient taking differently: Place 1-2 sprays into both nostrils daily as needed for allergies.) 16 g 6   ibuprofen (ADVIL) 200 MG tablet Take 400 mg by mouth every 6 (six) hours as needed for moderate pain.     KRILL OIL PO Take 1 tablet by mouth daily.     lisinopril-hydrochlorothiazide (ZESTORETIC) 20-12.5 MG tablet Take 2 tablets by mouth once daily 180 tablet 0   MAGNESIUM PO Take 1 tablet by mouth daily.     methocarbamol (ROBAXIN) 500 MG tablet Take 2 tablets (1,000 mg total) by mouth 4 (four) times daily. 30 tablet 0    Multiple Vitamins-Minerals (CENTRUM SILVER PO) Take 1 tablet by mouth daily.     Multiple Vitamins-Minerals (ZINC PO) Take 1 tablet by mouth daily.     Naphazoline-Pheniramine (OPCON-A) 0.027-0.315 % SOLN Place 1 drop into both eyes daily as needed (redness).     nicotine (NICODERM CQ - DOSED IN MG/24 HOURS) 14 mg/24hr patch 14mg /day for 6 weeks then 7mg /day x 2 weeks 28 patch 1   omeprazole (PRILOSEC) 20 MG capsule Take 1 capsule by mouth once daily 90 capsule 0   oxyCODONE (OXY IR/ROXICODONE) 5 MG immediate release tablet Take 1 tablet (5 mg total) by mouth every 6 (six) hours as needed for severe pain. 20 tablet 0   prochlorperazine (COMPAZINE) 10 MG tablet Take 1 tablet (10 mg total) by mouth every 6 (six) hours as needed for nausea or vomiting. 30 tablet 0   rosuvastatin (  CRESTOR) 5 MG tablet TAKE 1 TABLET BY MOUTH AT BEDTIME 90 tablet 0   TURMERIC PO Take 1 tablet by mouth 3 (three) times a week.     umeclidinium-vilanterol (ANORO ELLIPTA) 62.5-25 MCG/ACT AEPB Inhale 1 puff into the lungs daily. (Patient taking differently: Inhale 1 puff into the lungs daily as needed (shortness of breath).) 180 each 3   No current facility-administered medications for this encounter.    REVIEW OF SYSTEMS:  A 10+ POINT REVIEW OF SYSTEMS WAS OBTAINED including neurology, dermatology, psychiatry, cardiac, respiratory, lymph, extremities, GI, GU, musculoskeletal, constitutional, reproductive, HEENT. ***   PHYSICAL EXAM:  vitals were not taken for this visit.   General: Alert and oriented, in no acute distress HEENT: Head is normocephalic. Extraocular movements are intact. Oropharynx is clear. Neck: Neck is supple, no palpable cervical or supraclavicular lymphadenopathy. Heart: Regular in rate and rhythm with no murmurs, rubs, or gallops. Chest: Clear to auscultation bilaterally, with no rhonchi, wheezes, or rales. Abdomen: Soft, nontender, nondistended, with no rigidity or guarding. Extremities: No  cyanosis or edema. Lymphatics: see Neck Exam Skin: No concerning lesions. Musculoskeletal: symmetric strength and muscle tone throughout. Neurologic: Cranial nerves II through XII are grossly intact. No obvious focalities. Speech is fluent. Coordination is intact. Psychiatric: Judgment and insight are intact. Affect is appropriate. ***  ECOG = ***  0 - Asymptomatic (Fully active, able to carry on all predisease activities without restriction)  1 - Symptomatic but completely ambulatory (Restricted in physically strenuous activity but ambulatory and able to carry out work of a light or sedentary nature. For example, light housework, office work)  2 - Symptomatic, <50% in bed during the day (Ambulatory and capable of all self care but unable to carry out any work activities. Up and about more than 50% of waking hours)  3 - Symptomatic, >50% in bed, but not bedbound (Capable of only limited self-care, confined to bed or chair 50% or more of waking hours)  4 - Bedbound (Completely disabled. Cannot carry on any self-care. Totally confined to bed or chair)  5 - Death   Santiago Glad MM, Creech RH, Tormey DC, et al. [phone]). "Toxicity and response criteria of the [facility]". Am. Evlyn Clines. Oncol. 5 (6): 649-55  LABORATORY DATA:  Lab Results  Component Value Date   WBC 5.8 06/15/2022   HGB 14.2 06/15/2022   HCT 42.5 06/15/2022   MCV 88.4 06/15/2022   PLT 303 06/15/2022   NEUTROABS 3.5 06/15/2022   Lab Results  Component Value Date   NA 138 06/15/2022   K 4.1 06/15/2022   CL 100 06/15/2022   CO2 27 06/15/2022   GLUCOSE 135 (H) 06/15/2022   BUN 27 (H) 06/15/2022   CREATININE 1.35 (H) 06/15/2022   CALCIUM 10.2 06/15/2022      RADIOGRAPHY: MR BRAIN W WO CONTRAST  Result Date: 06/09/2022 CLINICAL DATA:  Non-small cell lung cancer (NSCLC), staging. EXAM: MRI HEAD WITHOUT AND WITH CONTRAST TECHNIQUE: Multiplanar, multiecho pulse sequences of the brain and surrounding  structures were obtained without and with intravenous contrast. CONTRAST:  7.24mL GADAVIST GADOBUTROL 1 MMOL/ML IV SOLN COMPARISON:  None Available. FINDINGS: Brain: No acute infarct or hemorrhage. Mild chronic small-vessel disease. No hydrocephalus or extra-axial collection. No foci of abnormal susceptibility. No mass or abnormal enhancement. Vascular: Normal flow voids and enhancement. Skull and upper cervical spine: Normal marrow signal and enhancement. Sinuses/Orbits: Unremarkable. Other: None. IMPRESSION: No evidence of metastatic disease to the brain. Electronically Signed   By:  Orvan Falconer M.D.   On: 06/09/2022 15:40   NM PET Image Initial (PI) Skull Base To Thigh  Result Date: 06/08/2022 CLINICAL DATA:  Initial treatment strategy for perihilar left lung mass. EXAM: NUCLEAR MEDICINE PET SKULL BASE TO THIGH TECHNIQUE: 9.1 mCi F-18 FDG was injected intravenously. Full-ring PET imaging was performed from the skull base to thigh after the radiotracer. CT data was obtained and used for attenuation correction and anatomic localization. Fasting blood glucose: 120 mg/dl COMPARISON:  16/11/9602 chest CT. FINDINGS: Mediastinal blood pool activity: SUV max 2.8 Liver activity: SUV max NA NECK: No hypermetabolic lymph nodes in the neck. Asymmetric hypermetabolism in the posterior right nasopharynx without discrete mass correlate on noncontrast CT images with max SUV 6.7. Incidental CT findings: None. CHEST: Intensely hypermetabolic central perihilar left upper lobe lung mass with max SUV 38.1, poorly delineated on the noncontrast CT images and better visualized on the recent contrast enhanced chest CT study, measuring approximately 3.1 x 1.7 cm (series 4/image 60), with associated occlusion of the apicoposterior segmental left upper lobe bronchus. No additional hypermetabolic pulmonary findings. Intensely hypermetabolic mildly enlarged 1.3 cm AP window node with max SUV 35.0 (series 4/image 64). No additional  enlarged or hypermetabolic mediastinal or hilar nodes. No enlarged or hypermetabolic axillary nodes. Incidental CT findings: Atherosclerotic nonaneurysmal thoracic aorta. Similar mild patchy tree-in-bud opacities in central left upper lobe. No additional significant pulmonary nodules. ABDOMEN/PELVIS: No abnormal hypermetabolic activity within the liver, pancreas, adrenal glands, or spleen. No hypermetabolic lymph nodes in the abdomen or pelvis. Intensely hypermetabolic 1.3 cm lower right retroperitoneal 1.3 cm soft tissue nodule with max SUV 30.7 (series 4/image 139). Incidental CT findings: Hypodense subcentimeter posterior lower left renal cortical lesion, too small to characterize, for which no follow-up imaging is recommended. Atherosclerotic nonaneurysmal abdominal aorta. SKELETON: Intensely hypermetabolic lytic posterior left T8 vertebral and posterior element 2.1 cm lesion with max SUV 43.1 (series 4/image 80). Intensely hypermetabolic L4 vertebral lytic 2.3 cm lesion with max SUV 52.1 (series 4/image 140).1 Incidental CT findings: None. IMPRESSION: 1. Intensely hypermetabolic central perihilar left upper lobe lung mass with associated occlusion of the apicoposterior segmental left upper lobe bronchus, compatible with primary bronchogenic carcinoma, better delineated on recent 05/12/2022 contrast-enhanced chest CT study. 2. Intensely hypermetabolic mildly enlarged AP window lymph node, compatible with ipsilateral mediastinal nodal metastasis. 3. Intensely hypermetabolic lytic spinal metastases at the T8 and L4 levels. 4. Intensely hypermetabolic 1.3 cm lower right retroperitoneal soft tissue nodule, compatible with soft tissue metastasis. 5. Asymmetric hypermetabolism in the posterior right nasopharynx without discrete mass correlate on noncontrast CT images, indeterminate, cannot exclude a mucosal lesion. Suggest correlation with direct visualization. 6.  Aortic Atherosclerosis (ICD10-I70.0). Electronically  Signed   By: Delbert Phenix M.D.   On: 06/08/2022 15:35   CT PELVIS WO CONTRAST  Result Date: 06/07/2022 CLINICAL DATA:  Suspected lung carcinoma, possible metastatic disease EXAM: CT PELVIS WITHOUT CONTRAST TECHNIQUE: Multidetector CT imaging of the pelvis was performed following the standard protocol without intravenous contrast. RADIATION DOSE REDUCTION: This exam was performed according to the departmental dose-optimization program which includes automated exposure control, adjustment of the mA and/or kV according to patient size and/or use of iterative reconstruction technique. COMPARISON:  PET-CT 06/04/2022 and previous FINDINGS: Urinary Tract:  Urinary bladder is distended. Bowel:  Visualized portions of small bowel and colon are nondilated. Vascular/Lymphatic: Scattered aortoiliac calcified atheromatous plaque without aneurysm. No pelvic adenopathy. Reproductive:  Prostate enlargement. Other:  No pelvic ascites. Musculoskeletal: Facet DJD L4-5 right greater  than left. 2.2 cm lytic lesion in the L4 vertebral body, incompletely visualized, corresponding to hypermetabolic focus on PET-CT. No other worrisome bone lesions. Marked narrowing of the L5-S1 interspace with endplate spurring and discogenic sclerosis, with partially calcified protrusion. Mild facet disease bilaterally L5-S1. No fracture or dislocation. IMPRESSION: 1. 2.2 cm lytic lesion in the L4 vertebral body, corresponding to hypermetabolic focus on PET-CT, consistent with metastatic disease. 2.  Aortic Atherosclerosis (ICD10-I70.0). Electronically Signed   By: Corlis Leak M.D.   On: 06/07/2022 09:42   CT Thoracic Spine Wo Contrast  Result Date: 06/07/2022 CLINICAL DATA:  Back pain radiating bilaterally. EXAM: CT THORACIC AND LUMBAR SPINE WITHOUT CONTRAST TECHNIQUE: Multidetector CT imaging of the thoracic and lumbar spine was performed without contrast. Multiplanar CT image reconstructions were also generated. RADIATION DOSE REDUCTION: This  exam was performed according to the departmental dose-optimization program which includes automated exposure control, adjustment of the mA and/or kV according to patient size and/or use of iterative reconstruction technique. COMPARISON:  PET CT from 3 days ago FINDINGS: CT THORACIC SPINE FINDINGS Alignment: Unremarkable Vertebrae: Lytic lesion in the left posterior aspect of the T8 body extending into the pedicle with cortical breakthrough at the level of the body and inferior pedicle cortex. Tumor infiltrates the epidural space and left T8-9 foramen. No visible cord compression. Paraspinal and other soft tissues: Reference recent PET CT. Disc levels: No significant degenerative change. CT LUMBAR SPINE FINDINGS Segmentation: 5 lumbar type vertebrae. Alignment: Normal. Vertebrae: Lytic lesion in the right aspect of the L4 body. No acute fracture. There is no visible extraosseous extension Paraspinal and other soft tissues: Reference recent PET CT Disc levels: Disc space narrowing with endplate and facet spurring especially at L3-4 and below. Disc degeneration at L5-S1 is advanced. Moderate foraminal narrowing at these levels. IMPRESSION: Metastatic lesions in the T8 and L4 vertebrae, more notable at T8 where there is extraosseous extension with left epidural and T8-9 foraminal infiltration. Electronically Signed   By: Tiburcio Pea M.D.   On: 06/07/2022 09:41   CT Lumbar Spine Wo Contrast  Result Date: 06/07/2022 CLINICAL DATA:  Back pain radiating bilaterally. EXAM: CT THORACIC AND LUMBAR SPINE WITHOUT CONTRAST TECHNIQUE: Multidetector CT imaging of the thoracic and lumbar spine was performed without contrast. Multiplanar CT image reconstructions were also generated. RADIATION DOSE REDUCTION: This exam was performed according to the departmental dose-optimization program which includes automated exposure control, adjustment of the mA and/or kV according to patient size and/or use of iterative reconstruction  technique. COMPARISON:  PET CT from 3 days ago FINDINGS: CT THORACIC SPINE FINDINGS Alignment: Unremarkable Vertebrae: Lytic lesion in the left posterior aspect of the T8 body extending into the pedicle with cortical breakthrough at the level of the body and inferior pedicle cortex. Tumor infiltrates the epidural space and left T8-9 foramen. No visible cord compression. Paraspinal and other soft tissues: Reference recent PET CT. Disc levels: No significant degenerative change. CT LUMBAR SPINE FINDINGS Segmentation: 5 lumbar type vertebrae. Alignment: Normal. Vertebrae: Lytic lesion in the right aspect of the L4 body. No acute fracture. There is no visible extraosseous extension Paraspinal and other soft tissues: Reference recent PET CT Disc levels: Disc space narrowing with endplate and facet spurring especially at L3-4 and below. Disc degeneration at L5-S1 is advanced. Moderate foraminal narrowing at these levels. IMPRESSION: Metastatic lesions in the T8 and L4 vertebrae, more notable at T8 where there is extraosseous extension with left epidural and T8-9 foraminal infiltration. Electronically Signed   By:  Tiburcio Pea M.D.   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   ***  Today, I talked to the patient and family about the findings and work-up thus far.  We discussed the natural history of *** and general treatment, highlighting the role of radiotherapy in the management.  We discussed the available radiation techniques, and focused on the details of logistics and delivery.  We reviewed the anticipated acute and late sequelae associated with radiation in this  setting.  The patient was encouraged to ask questions that I answered to the best of my ability. *** A patient consent form was discussed and signed.  We retained a copy for our records.  The patient would like to proceed with radiation and will be scheduled for CT simulation.  PLAN: ***    *** minutes of total time was spent for this patient encounter, including preparation, face-to-face counseling with the patient and coordination of care, physical exam, and documentation of the encounter.   ------------------------------------------------  Billie Lade, PhD, MD  This document serves as a record of services personally performed by Antony Blackbird, MD. It was created on his behalf by Neena Rhymes, a trained medical scribe. The creation of this record is based on the scribe's personal observations and the provider's statements to them. This document has been checked and approved by the attending provider.

## 2022-06-16 NOTE — Telephone Encounter (Signed)
Rn attempted to call pt for nurse evaluation information without success. Rn left message for pt on voicemail. RN will attempt to call back later.

## 2022-06-17 ENCOUNTER — Inpatient Hospital Stay: Payer: Medicare Other

## 2022-06-17 ENCOUNTER — Ambulatory Visit
Admission: RE | Admit: 2022-06-17 | Discharge: 2022-06-17 | Disposition: A | Discharge status: Home / Self Care | Payer: Medicare Other | Source: Ambulatory Visit | Attending: Radiation Oncology | Admitting: Radiation Oncology

## 2022-06-17 ENCOUNTER — Other Ambulatory Visit: Payer: Self-pay

## 2022-06-17 VITALS — BP 160/57 | HR 61 | Temp 98.1°F | Resp 16

## 2022-06-17 VITALS — BP 120/60 | HR 67 | Temp 97.4°F | Resp 18 | Ht 69.0 in | Wt 158.5 lb

## 2022-06-17 DIAGNOSIS — M48061 Spinal stenosis, lumbar region without neurogenic claudication: Secondary | ICD-10-CM | POA: Diagnosis not present

## 2022-06-17 DIAGNOSIS — N400 Enlarged prostate without lower urinary tract symptoms: Secondary | ICD-10-CM | POA: Diagnosis not present

## 2022-06-17 DIAGNOSIS — M47817 Spondylosis without myelopathy or radiculopathy, lumbosacral region: Secondary | ICD-10-CM | POA: Diagnosis not present

## 2022-06-17 DIAGNOSIS — J431 Panlobular emphysema: Secondary | ICD-10-CM | POA: Diagnosis not present

## 2022-06-17 DIAGNOSIS — C3492 Malignant neoplasm of unspecified part of left bronchus or lung: Secondary | ICD-10-CM

## 2022-06-17 DIAGNOSIS — I700 Atherosclerosis of aorta: Secondary | ICD-10-CM | POA: Diagnosis not present

## 2022-06-17 DIAGNOSIS — Z87891 Personal history of nicotine dependence: Secondary | ICD-10-CM | POA: Diagnosis not present

## 2022-06-17 DIAGNOSIS — M5136 Other intervertebral disc degeneration, lumbar region: Secondary | ICD-10-CM | POA: Diagnosis not present

## 2022-06-17 DIAGNOSIS — M5137 Other intervertebral disc degeneration, lumbosacral region: Secondary | ICD-10-CM | POA: Insufficient documentation

## 2022-06-17 DIAGNOSIS — Z800 Family history of malignant neoplasm of digestive organs: Secondary | ICD-10-CM | POA: Insufficient documentation

## 2022-06-17 DIAGNOSIS — K219 Gastro-esophageal reflux disease without esophagitis: Secondary | ICD-10-CM | POA: Diagnosis not present

## 2022-06-17 DIAGNOSIS — Z5111 Encounter for antineoplastic chemotherapy: Secondary | ICD-10-CM | POA: Diagnosis not present

## 2022-06-17 DIAGNOSIS — E785 Hyperlipidemia, unspecified: Secondary | ICD-10-CM | POA: Diagnosis not present

## 2022-06-17 DIAGNOSIS — M5127 Other intervertebral disc displacement, lumbosacral region: Secondary | ICD-10-CM | POA: Diagnosis not present

## 2022-06-17 DIAGNOSIS — Z79899 Other long term (current) drug therapy: Secondary | ICD-10-CM | POA: Diagnosis not present

## 2022-06-17 DIAGNOSIS — I739 Peripheral vascular disease, unspecified: Secondary | ICD-10-CM | POA: Insufficient documentation

## 2022-06-17 DIAGNOSIS — I10 Essential (primary) hypertension: Secondary | ICD-10-CM | POA: Diagnosis not present

## 2022-06-17 DIAGNOSIS — C7951 Secondary malignant neoplasm of bone: Secondary | ICD-10-CM | POA: Diagnosis not present

## 2022-06-17 DIAGNOSIS — C3412 Malignant neoplasm of upper lobe, left bronchus or lung: Secondary | ICD-10-CM | POA: Insufficient documentation

## 2022-06-17 MED ORDER — FAMOTIDINE 20 MG IN NS 100 ML IVPB
20.0000 mg | Freq: Once | INTRAVENOUS | Status: AC
  Administered 2022-06-17: 20 mg via INTRAVENOUS
  Filled 2022-06-17: qty 100

## 2022-06-17 MED ORDER — DIPHENHYDRAMINE HCL 50 MG/ML IJ SOLN
50.0000 mg | Freq: Once | INTRAMUSCULAR | Status: AC
  Administered 2022-06-17: 50 mg via INTRAVENOUS
  Filled 2022-06-17: qty 1

## 2022-06-17 MED ORDER — SODIUM CHLORIDE 0.9 % IV SOLN
350.0000 mg | Freq: Once | INTRAVENOUS | Status: AC
  Administered 2022-06-17: 350 mg via INTRAVENOUS
  Filled 2022-06-17: qty 7

## 2022-06-17 MED ORDER — DENOSUMAB 120 MG/1.7ML ~~LOC~~ SOLN
120.0000 mg | Freq: Once | SUBCUTANEOUS | Status: AC
  Administered 2022-06-17: 120 mg via SUBCUTANEOUS
  Filled 2022-06-17: qty 1.7

## 2022-06-17 MED ORDER — PALONOSETRON HCL INJECTION 0.25 MG/5ML
0.2500 mg | Freq: Once | INTRAVENOUS | Status: AC
  Administered 2022-06-17: 0.25 mg via INTRAVENOUS
  Filled 2022-06-17: qty 5

## 2022-06-17 MED ORDER — SODIUM CHLORIDE 0.9 % IV SOLN
175.0000 mg/m2 | Freq: Once | INTRAVENOUS | Status: AC
  Administered 2022-06-17: 330 mg via INTRAVENOUS
  Filled 2022-06-17: qty 55

## 2022-06-17 MED ORDER — SODIUM CHLORIDE 0.9 % IV SOLN
150.0000 mg | Freq: Once | INTRAVENOUS | Status: AC
  Administered 2022-06-17: 150 mg via INTRAVENOUS
  Filled 2022-06-17: qty 150

## 2022-06-17 MED ORDER — SODIUM CHLORIDE 0.9 % IV SOLN
389.0000 mg | Freq: Once | INTRAVENOUS | Status: AC
  Administered 2022-06-17: 390 mg via INTRAVENOUS
  Filled 2022-06-17: qty 39

## 2022-06-17 MED ORDER — SODIUM CHLORIDE 0.9 % IV SOLN: Freq: Once | INTRAVENOUS | Status: AC

## 2022-06-17 MED ORDER — SODIUM CHLORIDE 0.9 % IV SOLN
10.0000 mg | Freq: Once | INTRAVENOUS | Status: AC
  Administered 2022-06-17: 10 mg via INTRAVENOUS
  Filled 2022-06-17: qty 10

## 2022-06-17 NOTE — Patient Instructions (Signed)
[place] [facility]  Discharge Instructions: Thank you for choosing [facility] to provide your oncology and hematology care.   If you have a lab appointment with the Cancer Center, please go directly to the Cancer Center and check in at the registration area.   Wear comfortable clothing and clothing appropriate for easy access to any Portacath or PICC line.   We strive to give you quality time with your provider. You may need to reschedule your appointment if you arrive late (15 or more minutes).  Arriving late affects you and other patients whose appointments are after yours.  Also, if you miss three or more appointments without notifying the office, you may be dismissed from the clinic at the provider's discretion.      For prescription refill requests, have your pharmacy contact our office and allow 72 hours for refills to be completed.    Today you received the following chemotherapy and/or immunotherapy agents: Libtayo, Paxlitaxel, Carboplatin, Xgeva      To help prevent nausea and vomiting after your treatment, we encourage you to take your nausea medication as directed.  BELOW ARE SYMPTOMS THAT SHOULD BE REPORTED IMMEDIATELY: *FEVER GREATER THAN 100.4 F (38 C) OR HIGHER *CHILLS OR SWEATING *NAUSEA AND VOMITING THAT IS NOT CONTROLLED WITH YOUR NAUSEA MEDICATION *UNUSUAL SHORTNESS OF BREATH *UNUSUAL BRUISING OR BLEEDING *URINARY PROBLEMS (pain or burning when urinating, or frequent urination) *BOWEL PROBLEMS (unusual diarrhea, constipation, pain near the anus) TENDERNESS IN MOUTH AND THROAT WITH OR WITHOUT PRESENCE OF ULCERS (sore throat, sores in mouth, or a toothache) UNUSUAL RASH, SWELLING OR PAIN  UNUSUAL VAGINAL DISCHARGE OR ITCHING   Items with * indicate a potential emergency and should be followed up as soon as possible or go to the Emergency Department if any problems should occur.  Please show the CHEMOTHERAPY ALERT CARD or  IMMUNOTHERAPY ALERT CARD at check-in to the Emergency Department and triage nurse.  Should you have questions after your visit or need to cancel or reschedule your appointment, please contact [place] [facility]  Dept: [phone]  and follow the prompts.  Office hours are 8:00 a.m. to 4:30 p.m. Monday - Friday. Please note that voicemails left after 4:00 p.m. may not be returned until the following business day.  We are closed weekends and major holidays. You have access to a nurse at all times for urgent questions. Please call the main number to the clinic Dept: [phone] and follow the prompts.   For any non-urgent questions, you may also contact your provider using MyChart. We now offer e-Visits for anyone 79 and older to request care online for non-urgent symptoms. For details visit [email or web address].   Also download the MyChart app! Go to the app store, search "MyChart", open the app, select [place], and log in with your MyChart username and password.  Cemiplimab Injection What is this medication? CEMIPLIMAB (se MIP li mab) treats skin cancer and lung cancer. It works by helping your immune system slow or stop the spread of cancer cells. It is a monoclonal antibody. This medicine may be used for other purposes; ask your health care provider or pharmacist if you have questions. COMMON BRAND NAME(S): LIBTAYO What should I tell my care team before I take this medication? They need to know if you have any of these conditions: Allogeneic stem cell transplant (uses someone else's stem cells) Autoimmune diseases, such as Crohn's disease, ulcerative colitis, or lupus Diabetes  Nervous system problems, such as myasthenia gravis or Guillain-Barre syndrome Organ transplant Recent or ongoing radiation Thyroid disease An unusual or allergic reaction to cemiplimab, other medications, foods, dyes, or preservatives Pregnant or trying to get  pregnant Breast-feeding How should I use this medication? This medication is infused into a vein. It is given by your care team in a hospital or clinic setting. A special MedGuide will be given to you before each treatment. Be sure to read this information carefully each time. Talk to your care team about the use of this medication in children. Special care may be needed. Overdosage: If you think you have taken too much of this medicine contact a poison control center or emergency room at once. NOTE: This medicine is only for you. Do not share this medicine with others. What if I miss a dose? Keep appointments for follow-up doses. It is important not to miss your dose. Call your care team if you are unable to keep an appointment. What may interact with this medication? Interactions have not been studied. This list may not describe all possible interactions. Give your health care provider a list of all the medicines, herbs, non-prescription drugs, or dietary supplements you use. Also tell them if you smoke, drink alcohol, or use illegal drugs. Some items may interact with your medicine. What should I watch for while using this medication? This medication may make you feel generally unwell. This is not uncommon as chemotherapy can affect healthy cells as well as cancer cells. Report any side effects. Continue your course of treatment even though you feel ill until your care team tells you to stop. You may need blood work done while you are taking this medication. This medication may cause serious skin reactions. They can happen in the weeks to months after starting the medication. Contact your care team right away if you notice fevers or flu-like symptoms with a rash. The rash may be red or purple and then turn into blisters or peeling of the skin. You may also notice a red rash with swelling of the face, lips, or lymph nodes in your neck or under your arms. Tell your care team right away if you have  any changes in your vision. This medication may increase blood sugar. The risk may be higher in patients who already have diabetes. Ask your care team what you can do to lower your risk of diabetes while taking this medication. Talk to your care team if you wish to become pregnant or think you might be pregnant. This medication can cause serious birth defects if taken during pregnancy. A negative pregnancy test is required before starting this medication. A reliable form of contraception is recommended while taking this medication and for at least 4 months after stopping it. Do not breast-feed while taking this medication and for at least 4 months after stopping it. What side effects may I notice from receiving this medication? Side effects that you should report to your care team as soon as possible: Allergic reactions--skin rash, itching, hives, swelling of the face, lips, tongue, or throat Dry cough, shortness of breath or trouble breathing Eye pain, redness, irritation, or discharge with blurry or decreased vision Heart muscle inflammation--unusual weakness or fatigue, shortness of breath, chest pain, fast or irregular heartbeat, dizziness, swelling of the ankles, feet, or hands Hormone gland problems--headache, sensitivity to light, unusual weakness or fatigue, dizziness, fast or irregular heartbeat, increased sensitivity to cold or heat, excessive sweating, constipation, hair loss, increased thirst or  amount of urine, tremors or shaking, irritability Infusion reactions--chest pain, shortness of breath or trouble breathing, feeling faint or lightheaded Kidney injury (glomerulonephritis)--decrease in the amount of urine, red or dark brown urine, foamy or bubbly urine, swelling of the ankles, hands, or feet Liver injury--right upper belly pain, loss of appetite, nausea, light-colored stool, dark yellow or brown urine, yellowing skin or eyes, unusual weakness or fatigue Pain, tingling, or numbness in  the hands or feet, muscle weakness, change in vision, confusion or trouble speaking, loss of balance or coordination, trouble walking, seizures Rash, fever, and swollen lymph nodes Redness, blistering, peeling, or loosening of the skin, including inside the mouth Sudden or severe stomach pain, bloody diarrhea, fever, nausea, vomiting Side effects that usually do not require medical attention (report these to your care team if they continue or are bothersome): Bone, joint, or muscle pain Diarrhea Fatigue Loss of appetite Nausea Skin rash This list may not describe all possible side effects. Call your doctor for medical advice about side effects. You may report side effects to FDA at 1-800-FDA-1088. Where should I keep my medication? This medication is given in a hospital or clinic and will not be stored at home. NOTE: This sheet is a summary. It may not cover all possible information. If you have questions about this medicine, talk to your doctor, pharmacist, or health care provider.  2023 Elsevier/Gold Standard (2020-12-27 00:00:00)  Paclitaxel Injection What is this medication? PACLITAXEL (PAK li TAX el) treats some types of cancer. It works by slowing down the growth of cancer cells. This medicine may be used for other purposes; ask your health care provider or pharmacist if you have questions. COMMON BRAND NAME(S): Onxol, Taxol What should I tell my care team before I take this medication? They need to know if you have any of these conditions: Heart disease Liver disease Low white blood cell levels An unusual or allergic reaction to paclitaxel, other medications, foods, dyes, or preservatives If you or your partner are pregnant or trying to get pregnant Breast-feeding How should I use this medication? This medication is injected into a vein. It is given by your care team in a hospital or clinic setting. Talk to your care team about the use of this medication in children. While it  may be given to children for selected conditions, precautions do apply. Overdosage: If you think you have taken too much of this medicine contact a poison control center or emergency room at once. NOTE: This medicine is only for you. Do not share this medicine with others. What if I miss a dose? Keep appointments for follow-up doses. It is important not to miss your dose. Call your care team if you are unable to keep an appointment. What may interact with this medication? Do not take this medication with any of the following: Live virus vaccines Other medications may affect the way this medication works. Talk with your care team about all of the medications you take. They may suggest changes to your treatment plan to lower the risk of side effects and to make sure your medications work as intended. This list may not describe all possible interactions. Give your health care provider a list of all the medicines, herbs, non-prescription drugs, or dietary supplements you use. Also tell them if you smoke, drink alcohol, or use illegal drugs. Some items may interact with your medicine. What should I watch for while using this medication? Your condition will be monitored carefully while you are receiving this  medication. You may need blood work while taking this medication. This medication may make you feel generally unwell. This is not uncommon as chemotherapy can affect healthy cells as well as cancer cells. Report any side effects. Continue your course of treatment even though you feel ill unless your care team tells you to stop. This medication can cause serious allergic reactions. To reduce the risk, your care team may give you other medications to take before receiving this one. Be sure to follow the directions from your care team. This medication may increase your risk of getting an infection. Call your care team for advice if you get a fever, chills, sore throat, or other symptoms of a cold or flu. Do  not treat yourself. Try to avoid being around people who are sick. This medication may increase your risk to bruise or bleed. Call your care team if you notice any unusual bleeding. Be careful brushing or flossing your teeth or using a toothpick because you may get an infection or bleed more easily. If you have any dental work done, tell your dentist you are receiving this medication. Talk to your care team if you may be pregnant. Serious birth defects can occur if you take this medication during pregnancy. Talk to your care team before breastfeeding. Changes to your treatment plan may be needed. What side effects may I notice from receiving this medication? Side effects that you should report to your care team as soon as possible: Allergic reactions--skin rash, itching, hives, swelling of the face, lips, tongue, or throat Heart rhythm changes--fast or irregular heartbeat, dizziness, feeling faint or lightheaded, chest pain, trouble breathing Increase in blood pressure Infection--fever, chills, cough, sore throat, wounds that don't heal, pain or trouble when passing urine, general feeling of discomfort or being unwell Low blood pressure--dizziness, feeling faint or lightheaded, blurry vision Low red blood cell level--unusual weakness or fatigue, dizziness, headache, trouble breathing Painful swelling, warmth, or redness of the skin, blisters or sores at the infusion site Pain, tingling, or numbness in the hands or feet Slow heartbeat--dizziness, feeling faint or lightheaded, confusion, trouble breathing, unusual weakness or fatigue Unusual bruising or bleeding Side effects that usually do not require medical attention (report to your care team if they continue or are bothersome): Diarrhea Hair loss Joint pain Loss of appetite Muscle pain Nausea Vomiting This list may not describe all possible side effects. Call your doctor for medical advice about side effects. You may report side effects to  FDA at 1-800-FDA-1088. Where should I keep my medication? This medication is given in a hospital or clinic. It will not be stored at home. NOTE: This sheet is a summary. It may not cover all possible information. If you have questions about this medicine, talk to your doctor, pharmacist, or health care provider.  2023 Elsevier/Gold Standard (2021-06-12 00:00:00)  Carboplatin Injection What is this medication? CARBOPLATIN (KAR boe pla tin) treats some types of cancer. It works by slowing down the growth of cancer cells. This medicine may be used for other purposes; ask your health care provider or pharmacist if you have questions. COMMON BRAND NAME(S): Paraplatin What should I tell my care team before I take this medication? They need to know if you have any of these conditions: Blood disorders Hearing problems Kidney disease Recent or ongoing radiation therapy An unusual or allergic reaction to carboplatin, cisplatin, other medications, foods, dyes, or preservatives Pregnant or trying to get pregnant Breast-feeding How should I use this medication? This medication is  injected into a vein. It is given by your care team in a hospital or clinic setting. Talk to your care team about the use of this medication in children. Special care may be needed. Overdosage: If you think you have taken too much of this medicine contact a poison control center or emergency room at once. NOTE: This medicine is only for you. Do not share this medicine with others. What if I miss a dose? Keep appointments for follow-up doses. It is important not to miss your dose. Call your care team if you are unable to keep an appointment. What may interact with this medication? Medications for seizures Some antibiotics, such as amikacin, gentamicin, neomycin, streptomycin, tobramycin Vaccines This list may not describe all possible interactions. Give your health care provider a list of all the medicines, herbs,  non-prescription drugs, or dietary supplements you use. Also tell them if you smoke, drink alcohol, or use illegal drugs. Some items may interact with your medicine. What should I watch for while using this medication? Your condition will be monitored carefully while you are receiving this medication. You may need blood work while taking this medication. This medication may make you feel generally unwell. This is not uncommon, as chemotherapy can affect healthy cells as well as cancer cells. Report any side effects. Continue your course of treatment even though you feel ill unless your care team tells you to stop. In some cases, you may be given additional medications to help with side effects. Follow all directions for their use. This medication may increase your risk of getting an infection. Call your care team for advice if you get a fever, chills, sore throat, or other symptoms of a cold or flu. Do not treat yourself. Try to avoid being around people who are sick. Avoid taking medications that contain aspirin, acetaminophen, ibuprofen, naproxen, or ketoprofen unless instructed by your care team. These medications may hide a fever. Be careful brushing or flossing your teeth or using a toothpick because you may get an infection or bleed more easily. If you have any dental work done, tell your dentist you are receiving this medication. Talk to your care team if you wish to become pregnant or think you might be pregnant. This medication can cause serious birth defects. Talk to your care team about effective forms of contraception. Do not breast-feed while taking this medication. What side effects may I notice from receiving this medication? Side effects that you should report to your care team as soon as possible: Allergic reactions--skin rash, itching, hives, swelling of the face, lips, tongue, or throat Infection--fever, chills, cough, sore throat, wounds that don't heal, pain or trouble when passing  urine, general feeling of discomfort or being unwell Low red blood cell level--unusual weakness or fatigue, dizziness, headache, trouble breathing Pain, tingling, or numbness in the hands or feet, muscle weakness, change in vision, confusion or trouble speaking, loss of balance or coordination, trouble walking, seizures Unusual bruising or bleeding Side effects that usually do not require medical attention (report to your care team if they continue or are bothersome): Hair loss Nausea Unusual weakness or fatigue Vomiting This list may not describe all possible side effects. Call your doctor for medical advice about side effects. You may report side effects to FDA at 1-800-FDA-1088. Where should I keep my medication? This medication is given in a hospital or clinic. It will not be stored at home. NOTE: This sheet is a summary. It may not cover all possible information.  If you have questions about this medicine, talk to your doctor, pharmacist, or health care provider.  2023 Elsevier/Gold Standard (2007-03-19 00:00:00)  Denosumab Injection (Oncology) What is this medication? DENOSUMAB (den oh SUE mab) prevents weakened bones caused by cancer. It may also be used to treat noncancerous bone tumors that cannot be removed by surgery. It can also be used to treat high calcium levels in the blood caused by cancer. It works by blocking a protein that causes bones to break down quickly. This slows down the release of calcium from bones, which lowers calcium levels in your blood. It also makes your bones stronger and less likely to break (fracture). This medicine may be used for other purposes; ask your health care provider or pharmacist if you have questions. COMMON BRAND NAME(S): XGEVA What should I tell my care team before I take this medication? They need to know if you have any of these conditions: Dental disease Having surgery or tooth extraction Infection Kidney disease Low levels of calcium  or vitamin D in the blood Malnutrition On hemodialysis Skin conditions or sensitivity Thyroid or parathyroid disease An unusual reaction to denosumab, other medications, foods, dyes, or preservatives Pregnant or trying to get pregnant Breast-feeding How should I use this medication? This medication is for injection under the skin. It is given by your care team in a hospital or clinic setting. A special MedGuide will be given to you before each treatment. Be sure to read this information carefully each time. Talk to your care team about the use of this medication in children. While it may be prescribed for children as young as 13 years for selected conditions, precautions do apply. Overdosage: If you think you have taken too much of this medicine contact a poison control center or emergency room at once. NOTE: This medicine is only for you. Do not share this medicine with others. What if I miss a dose? Keep appointments for follow-up doses. It is important not to miss your dose. Call your care team if you are unable to keep an appointment. What may interact with this medication? Do not take this medication with any of the following: Other medications containing denosumab This medication may also interact with the following: Medications that lower your chance of fighting infection Steroid medications, such as prednisone or cortisone This list may not describe all possible interactions. Give your health care provider a list of all the medicines, herbs, non-prescription drugs, or dietary supplements you use. Also tell them if you smoke, drink alcohol, or use illegal drugs. Some items may interact with your medicine. What should I watch for while using this medication? Your condition will be monitored carefully while you are receiving this medication. You may need blood work while taking this medication. This medication may increase your risk of getting an infection. Call your care team for  advice if you get a fever, chills, sore throat, or other symptoms of a cold or flu. Do not treat yourself. Try to avoid being around people who are sick. You should make sure you get enough calcium and vitamin D while you are taking this medication, unless your care team tells you not to. Discuss the foods you eat and the vitamins you take with your care team. Some people who take this medication have severe bone, joint, or muscle pain. This medication may also increase your risk for jaw problems or a broken thigh bone. Tell your care team right away if you have severe pain in your  jaw, bones, joints, or muscles. Tell your care team if you have any pain that does not go away or that gets worse. Talk to your care team if you may be pregnant. Serious birth defects can occur if you take this medication during pregnancy and for 5 months after the last dose. You will need a negative pregnancy test before starting this medication. Contraception is recommended while taking this medication and for 5 months after the last dose. Your care team can help you find the option that works for you. What side effects may I notice from receiving this medication? Side effects that you should report to your care team as soon as possible: Allergic reactions--skin rash, itching, hives, swelling of the face, lips, tongue, or throat Bone, joint, or muscle pain Low calcium level--muscle pain or cramps, confusion, tingling, or numbness in the hands or feet Osteonecrosis of the jaw--pain, swelling, or redness in the mouth, numbness of the jaw, poor healing after dental work, unusual discharge from the mouth, visible bones in the mouth Side effects that usually do not require medical attention (report to your care team if they continue or are bothersome): Cough Diarrhea Fatigue Headache Nausea This list may not describe all possible side effects. Call your doctor for medical advice about side effects. You may report side effects  to FDA at 1-800-FDA-1088. Where should I keep my medication? This medication is given in a hospital or clinic. It will not be stored at home. NOTE: This sheet is a summary. It may not cover all possible information. If you have questions about this medicine, talk to your doctor, pharmacist, or health care provider.  2023 Elsevier/Gold Standard (2021-06-16 00:00:00)

## 2022-06-18 ENCOUNTER — Telehealth: Payer: Self-pay | Admitting: *Deleted

## 2022-06-18 ENCOUNTER — Other Ambulatory Visit: Payer: Self-pay

## 2022-06-18 ENCOUNTER — Other Ambulatory Visit: Payer: Self-pay | Admitting: Nurse Practitioner

## 2022-06-18 ENCOUNTER — Ambulatory Visit
Admission: RE | Admit: 2022-06-18 | Discharge: 2022-06-18 | Disposition: A | Discharge status: Home / Self Care | Payer: Medicare Other | Source: Ambulatory Visit | Attending: Radiation Oncology | Admitting: Radiation Oncology

## 2022-06-18 ENCOUNTER — Encounter: Payer: Self-pay | Admitting: Nurse Practitioner

## 2022-06-18 ENCOUNTER — Inpatient Hospital Stay (HOSPITAL_BASED_OUTPATIENT_CLINIC_OR_DEPARTMENT_OTHER): Payer: Medicare Other | Admitting: Nurse Practitioner

## 2022-06-18 VITALS — BP 163/56 | HR 60 | Temp 97.8°F | Resp 13 | Wt 160.2 lb

## 2022-06-18 DIAGNOSIS — C3492 Malignant neoplasm of unspecified part of left bronchus or lung: Secondary | ICD-10-CM

## 2022-06-18 DIAGNOSIS — Z7189 Other specified counseling: Secondary | ICD-10-CM

## 2022-06-18 DIAGNOSIS — G893 Neoplasm related pain (acute) (chronic): Secondary | ICD-10-CM | POA: Diagnosis not present

## 2022-06-18 DIAGNOSIS — R630 Anorexia: Secondary | ICD-10-CM

## 2022-06-18 DIAGNOSIS — Z515 Encounter for palliative care: Secondary | ICD-10-CM | POA: Diagnosis not present

## 2022-06-18 DIAGNOSIS — M62830 Muscle spasm of back: Secondary | ICD-10-CM

## 2022-06-18 DIAGNOSIS — R530 Neoplastic (malignant) related fatigue: Secondary | ICD-10-CM

## 2022-06-18 DIAGNOSIS — Z5111 Encounter for antineoplastic chemotherapy: Secondary | ICD-10-CM | POA: Diagnosis not present

## 2022-06-18 DIAGNOSIS — R634 Abnormal weight loss: Secondary | ICD-10-CM

## 2022-06-18 MED ORDER — OXYCODONE HCL 5 MG PO TABS
5.0000 mg | ORAL_TABLET | ORAL | 0 refills | Status: DC | PRN
Start: 2022-06-18 — End: 2022-07-03

## 2022-06-18 MED ORDER — METHOCARBAMOL 500 MG PO TABS
500.0000 mg | ORAL_TABLET | Freq: Three times a day (TID) | ORAL | 0 refills | Status: DC | PRN
Start: 2022-06-18 — End: 2022-06-18

## 2022-06-18 MED ORDER — MORPHINE SULFATE ER 15 MG PO TBCR: 15.0000 mg | EXTENDED_RELEASE_TABLET | Freq: Two times a day (BID) | ORAL | 0 refills | Status: DC

## 2022-06-18 NOTE — Telephone Encounter (Signed)
-----   Message from Mickeal Needy, RN sent at 06/17/2022  5:18 PM EDT ----- Regarding: First timer 06/17/22- Dr. Arbutus Ped patient. Tolerated his chemo well- libtayo, taxol, carboplatin.

## 2022-06-18 NOTE — Patient Instructions (Addendum)
-   pick up and continue with your oxycodone, you can take every 4-6 hours as needed for pain - continue to use colace every day, once in the AM and once in the PM for constipation, if you find that it is not working add in mirilax for constipation  - pick up and take the long acting pain medication MS Contin (long acting morphine) take every 12 hours for extended relief for pain take every day regardless of pain level - if you develop shortness of breath or your throat feels tight call 911, if you are drowsy or have a rash stop your medication and call us in the office or MyChart message [place] - give [place] a call 2-3 days before you need a refill so we have time to send it and the pharmacy has time to fill your medication

## 2022-06-18 NOTE — Telephone Encounter (Signed)
Pt seen by Palliative Care staff. Discussed treatment yesterday. Pt states he did well. No N/V, bowels moving well. Eating well. Discussed when he might expect nausea and use of anti-emetics.

## 2022-06-19 ENCOUNTER — Inpatient Hospital Stay: Payer: Medicare Other

## 2022-06-19 VITALS — BP 128/60 | HR 60 | Temp 98.7°F | Resp 16

## 2022-06-19 DIAGNOSIS — C3492 Malignant neoplasm of unspecified part of left bronchus or lung: Secondary | ICD-10-CM

## 2022-06-19 MED ORDER — PEGFILGRASTIM-FPGK 6 MG/0.6ML ~~LOC~~ SOSY
6.0000 mg | PREFILLED_SYRINGE | Freq: Once | SUBCUTANEOUS | Status: DC
  Filled 2022-06-19: qty 0.6

## 2022-06-20 ENCOUNTER — Other Ambulatory Visit: Payer: Self-pay | Admitting: Family Medicine

## 2022-06-20 DIAGNOSIS — K219 Gastro-esophageal reflux disease without esophagitis: Secondary | ICD-10-CM

## 2022-06-20 DIAGNOSIS — E785 Hyperlipidemia, unspecified: Secondary | ICD-10-CM

## 2022-06-21 IMAGING — CT CT CHEST LUNG CANCER SCREENING LOW DOSE W/O CM
1 series · 15 of 31 positions shown, 19 images · non-contrast
Comparison: 03/27/2019 and the plain film of 10/25/2019.

CLINICAL DATA: Thirty pack-year smoking history, quitting 9 years
ago.

EXAM:
CT CHEST WITHOUT CONTRAST LOW-DOSE FOR LUNG CANCER SCREENING
TECHNIQUE: Multidetector CT imaging of the chest was performed following the
standard protocol without IV contrast.

[Series 2: ldct screening <30 bmi · axial · 0.71mm/px · z∈[-346,-32]mm · 15 of 69 slices shown, 19 images]
[im 3/69  mediastinal]
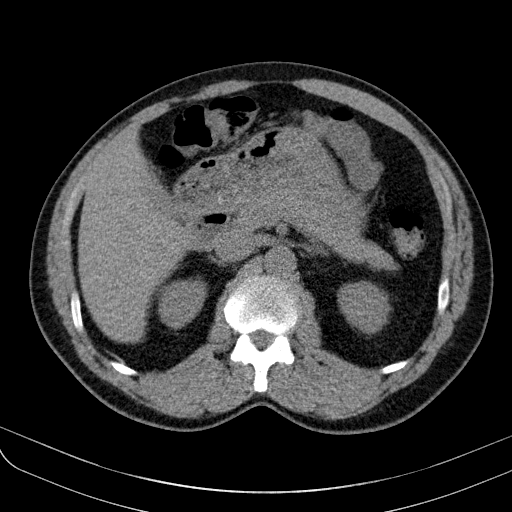
[im 3/69  lung]
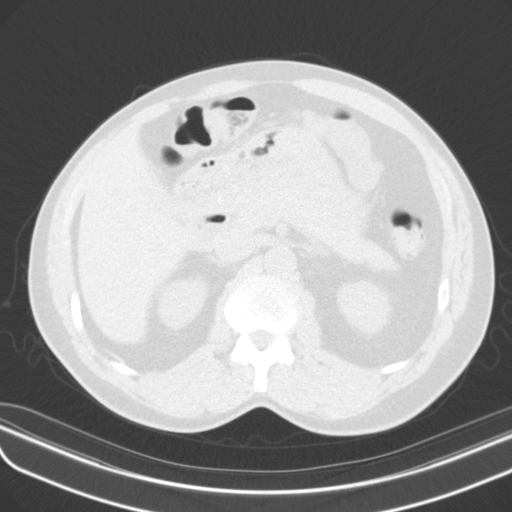
[im 8/69  lung]
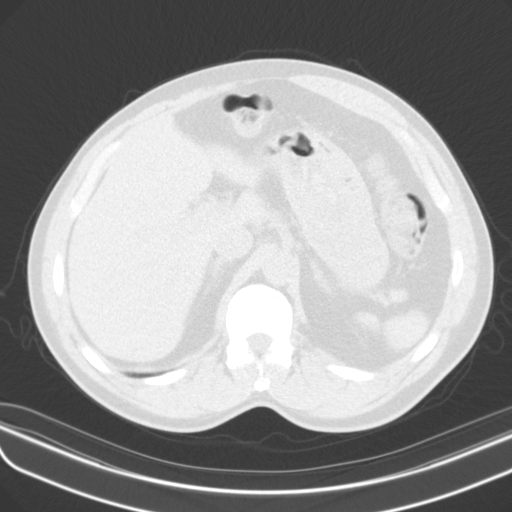
[im 13/69  lung]
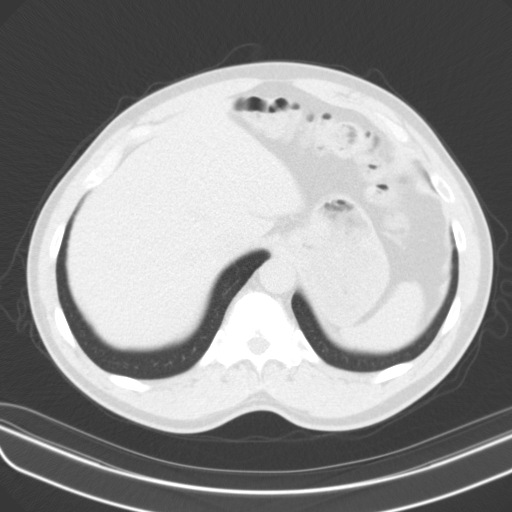
[im 16/69  lung]
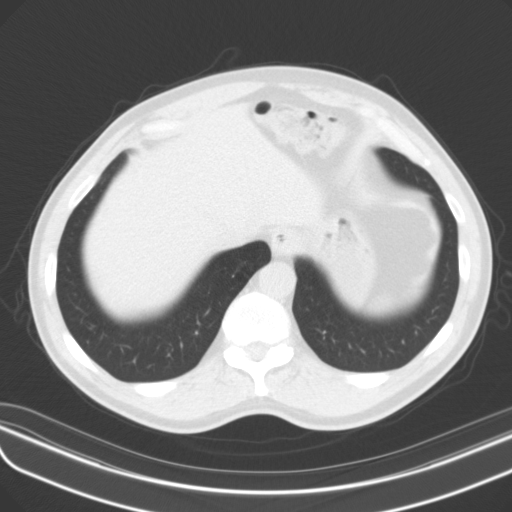
[im 21/69  mediastinal]
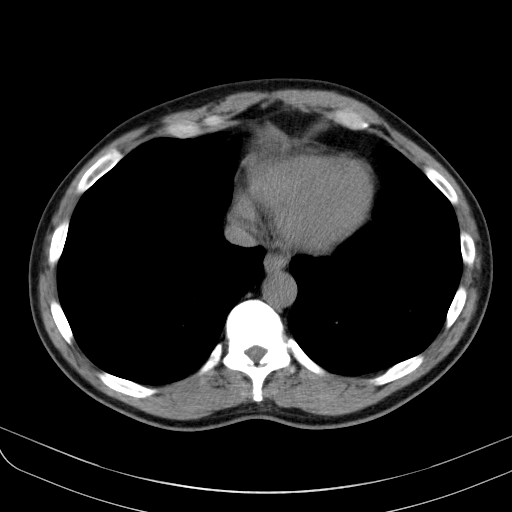
[im 21/69  lung]
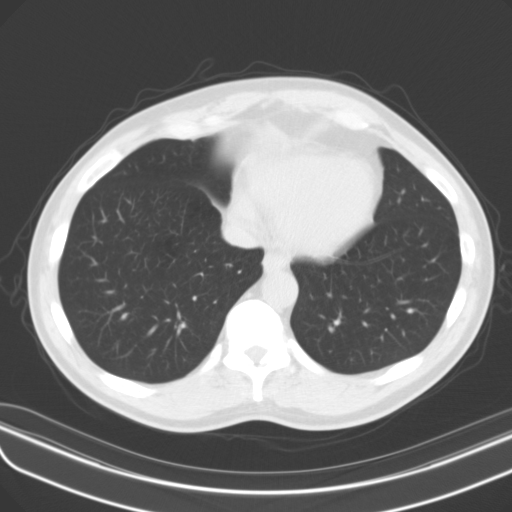
[im 26/69  lung]
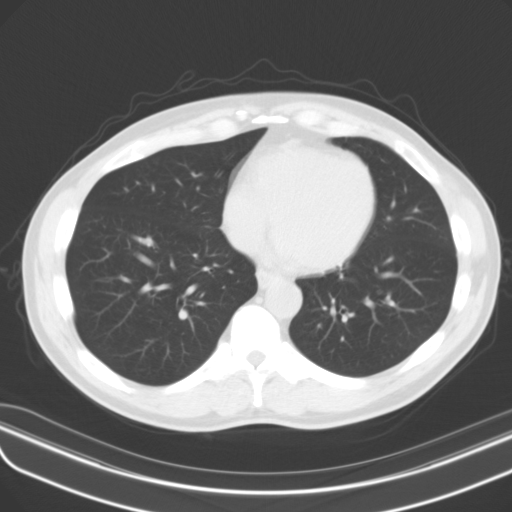
[im 31/69  lung]
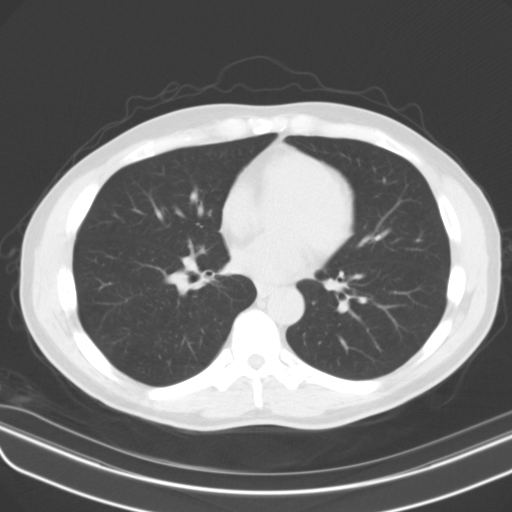
[im 36/69  lung]
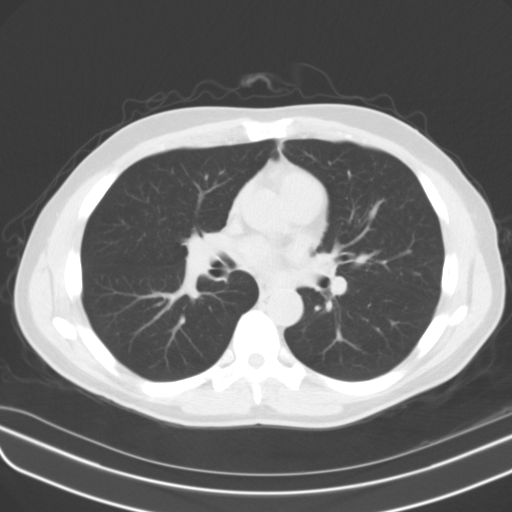
[im 38/69  mediastinal]
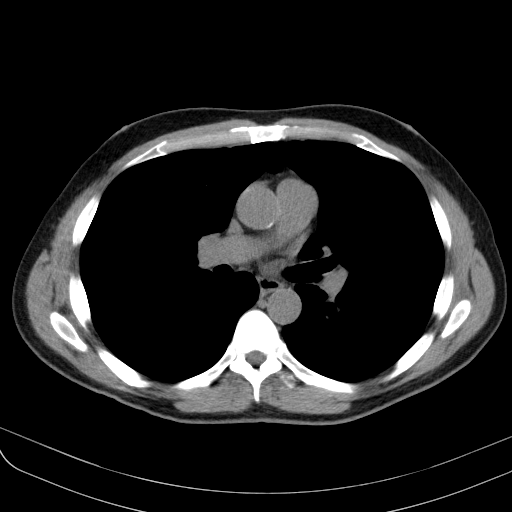
[im 38/69  lung]
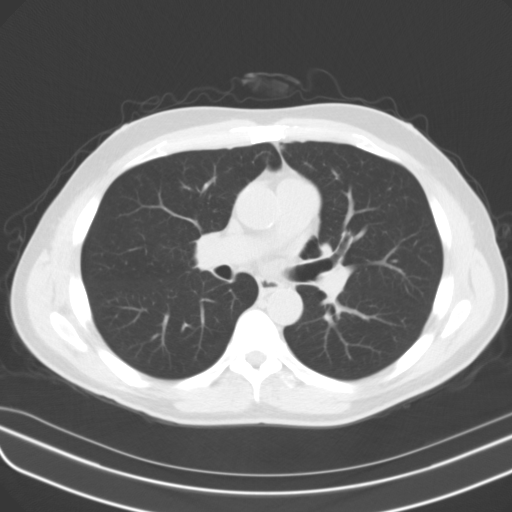
[im 43/69  lung]
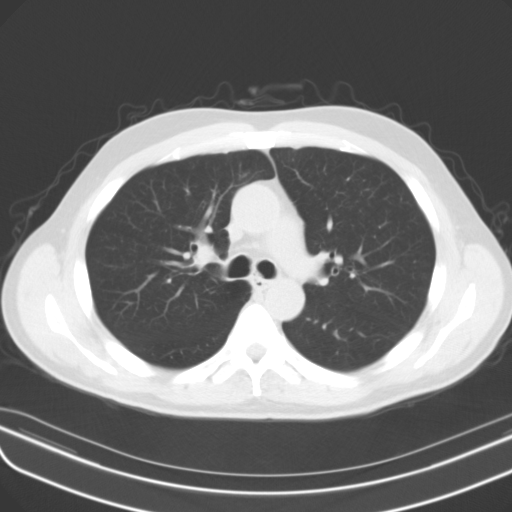
[im 48/69  lung]
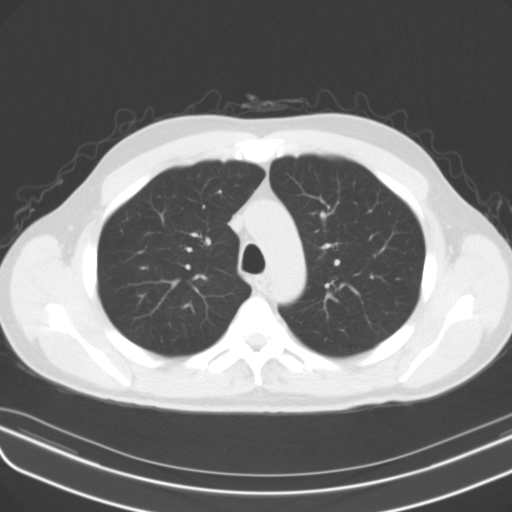
[im 53/69  lung]
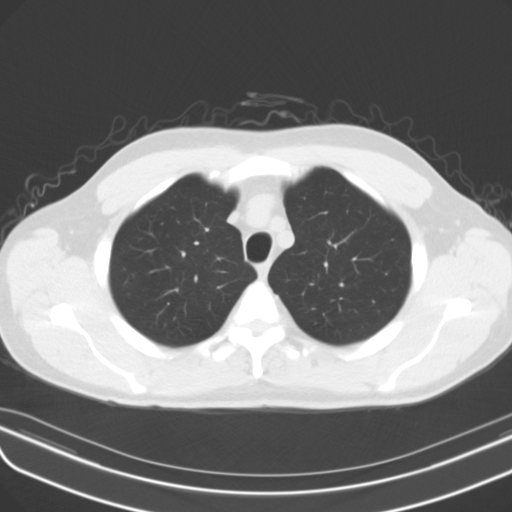
[im 56/69  mediastinal]
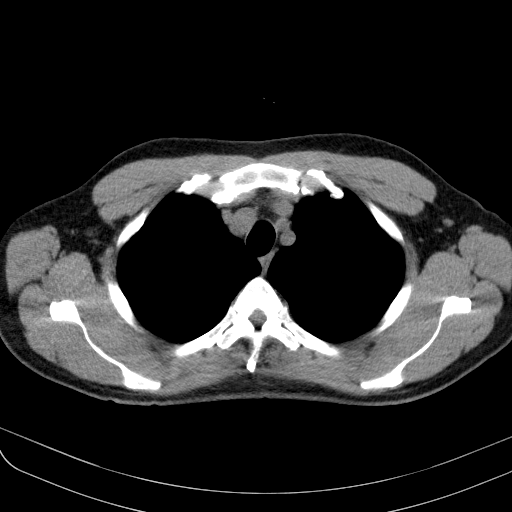
[im 56/69  lung]
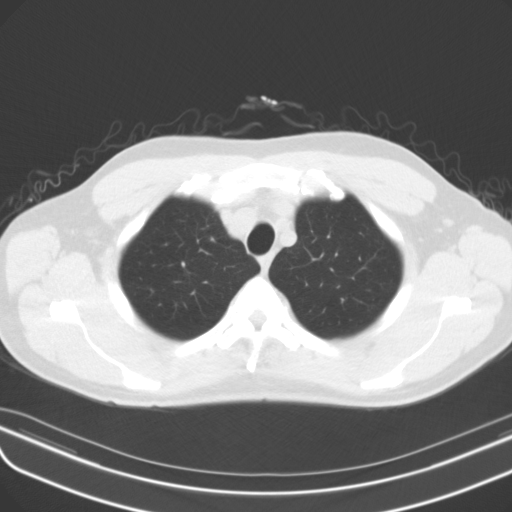
[im 61/69  lung]
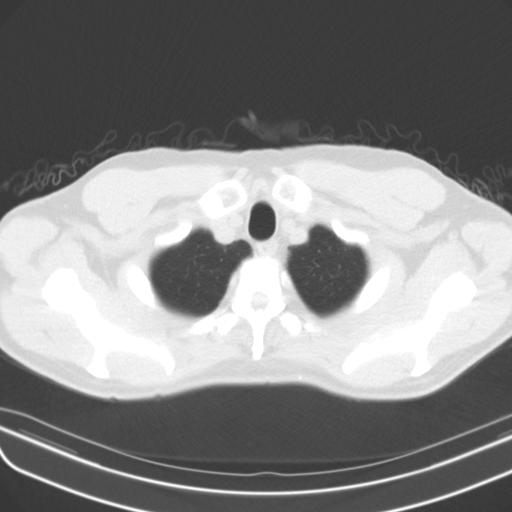
[im 66/69  lung]
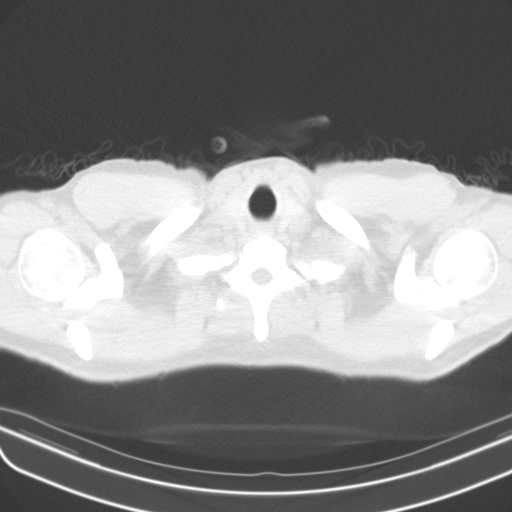

[15 of 31 positions shown; findings below may reference images not displayed]

FINDINGS: Cardiovascular: Aortic atherosclerosis. Bovine arch. Tortuous
thoracic aorta. Normal heart size, without pericardial effusion.

Mediastinum/Nodes: No mediastinal or definite hilar adenopathy,
given limitations of unenhanced CT.

Lungs/Pleura: No pleural fluid. Mild centrilobular emphysema.
Isolated posterior left upper lobe pulmonary nodule of volume
derived equivalent diameter 1.2 mm is not significantly changed.

Upper Abdomen: Normal imaged portions of the liver, spleen, stomach,
pancreas, gallbladder, adrenal glands, kidneys.

Musculoskeletal: No acute osseous abnormality.
IMPRESSION: 1. Lung-RADS 2, benign appearance or behavior. Continue annual
screening with low-dose chest CT without contrast in 12 months.
2. Aortic Atherosclerosis (OWJRL-563.3) and Emphysema (OWJRL-C1U.9).

## 2022-06-22 ENCOUNTER — Inpatient Hospital Stay (HOSPITAL_BASED_OUTPATIENT_CLINIC_OR_DEPARTMENT_OTHER): Payer: Medicare Other | Admitting: Internal Medicine

## 2022-06-22 ENCOUNTER — Other Ambulatory Visit: Payer: Self-pay

## 2022-06-22 ENCOUNTER — Inpatient Hospital Stay: Payer: Medicare Other

## 2022-06-22 VITALS — BP 126/73 | HR 85 | Temp 98.4°F | Resp 16 | Wt 157.6 lb

## 2022-06-22 DIAGNOSIS — Z5111 Encounter for antineoplastic chemotherapy: Secondary | ICD-10-CM | POA: Diagnosis not present

## 2022-06-22 DIAGNOSIS — E785 Hyperlipidemia, unspecified: Secondary | ICD-10-CM

## 2022-06-22 DIAGNOSIS — C3492 Malignant neoplasm of unspecified part of left bronchus or lung: Secondary | ICD-10-CM | POA: Diagnosis not present

## 2022-06-22 DIAGNOSIS — K219 Gastro-esophageal reflux disease without esophagitis: Secondary | ICD-10-CM

## 2022-06-22 LAB — COMPREHENSIVE METABOLIC PANEL
ALT: 43 U/L (ref 0–44)
AST: 34 U/L (ref 15–41)
Albumin: 4.3 g/dL (ref 3.5–5.0)
Alkaline Phosphatase: 118 U/L (ref 38–126)
Anion gap: 12 (ref 5–15)
BUN: 25 mg/dL — ABNORMAL HIGH (ref 8–23)
CO2: 25 mmol/L (ref 22–32)
Calcium: 8.7 mg/dL — ABNORMAL LOW (ref 8.9–10.3)
Chloride: 98 mmol/L (ref 98–111)
Creatinine, Ser: 1.24 mg/dL (ref 0.61–1.24)
GFR, Estimated: >60 mL/min (ref >60)
Glucose, Bld: 132 mg/dL — ABNORMAL HIGH (ref 70–99)
Potassium: 3.9 mmol/L (ref 3.5–5.1)
Sodium: 135 mmol/L (ref 135–145)
Total Bilirubin: 0.3 mg/dL (ref 0.3–1.2)
Total Protein: 8.2 g/dL — ABNORMAL HIGH (ref 6.5–8.1)

## 2022-06-22 LAB — CBC WITH DIFFERENTIAL/PLATELET
Abs Immature Granulocytes: 0.75 10*3/uL — ABNORMAL HIGH (ref 0.00–0.07)
Basophils Absolute: 0 10*3/uL (ref 0.0–0.1)
Basophils Relative: 0 %
Eosinophils Absolute: 0.1 10*3/uL (ref 0.0–0.5)
Eosinophils Relative: 1 %
HCT: 40.2 % (ref 39.0–52.0)
Hemoglobin: 13.4 g/dL (ref 13.0–17.0)
Immature Granulocytes: 4 %
Lymphocytes Relative: 7 %
Lymphs Abs: 1.3 10*3/uL (ref 0.7–4.0)
MCH: 29.5 pg (ref 26.0–34.0)
MCHC: 33.3 g/dL (ref 30.0–36.0)
MCV: 88.5 fL (ref 80.0–100.0)
Monocytes Absolute: 0.4 10*3/uL (ref 0.1–1.0)
Monocytes Relative: 2 %
Neutro Abs: 16.7 10*3/uL — ABNORMAL HIGH (ref 1.7–7.7)
Neutrophils Relative %: 86 %
Platelets: 195 10*3/uL (ref 150–400)
RBC: 4.54 MIL/uL (ref 4.22–5.81)
RDW: 13.2 % (ref 11.5–15.5)
WBC: 19.3 10*3/uL — ABNORMAL HIGH (ref 4.0–10.5)
nRBC: 0 % (ref 0.0–0.2)

## 2022-06-22 MED ORDER — OMEPRAZOLE 20 MG PO CPDR
20.0000 mg | DELAYED_RELEASE_CAPSULE | Freq: Every day | ORAL | 0 refills | Status: DC
Start: 2022-06-22 — End: 2022-07-20

## 2022-06-22 MED ORDER — ROSUVASTATIN CALCIUM 5 MG PO TABS
5.0000 mg | ORAL_TABLET | Freq: Every day | ORAL | 0 refills | Status: DC
Start: 2022-06-22 — End: 2022-07-20

## 2022-06-22 NOTE — Progress Notes (Signed)
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   Molecular studies by [id number] showed no actionable mutations and PD-L1 expression of 70%.  PRIOR THERAPY: None.  CURRENT THERAPY: Systemic chemotherapy with carboplatin for AUC of 5, paclitaxel 175 Mg/M2 and Libtayo (Cempilimab) 350 Mg IV every 3 weeks with Neulasta support for 4 cycles followed by maintenance treatment with Libtayo (Cempilimab) 350 Mg IV every 3 weeks.  INTERVAL HISTORY: Albert Hardy. 67 y.o. male returns to the clinic today for follow-up visit.  The patient is feeling fine today with no concerning complaints.  He has a lot of aching pain and fatigue after the Neulasta injection but he is much better now.  He denied having any nausea, vomiting, diarrhea but has constipation.  He has no fever or chills.  He has no chest pain, shortness of breath, cough or hemoptysis.  He has no recent weight loss or night sweats.  He is here today for evaluation with repeat blood work.  MEDICAL HISTORY: Past Medical History:  Diagnosis Date   Aortic regurgitation    Aortic stenosis 04/04/2018   Atherosclerosis of native arteries of the extremities with intermittent claudication 09/08/2013   Bilateral impacted cerumen 10/13/2018   Bilateral sensorineural hearing loss 12/14/2018   Dyspnea    Former moderate cigarette smoker (10-19 per day) 05/20/2012   Quit smoking Nov 2013 when hospitalized w/ HTN and chest pain(diagnosed w/ valvular heart disease)   GERD (gastroesophageal reflux disease)    Heart murmur     Hyperlipidemia    Hypertension    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  MEDICATIONS:  Current Outpatient Medications  Medication Sig Dispense Refill   albuterol (VENTOLIN HFA) 108 (90 Base) MCG/ACT inhaler Inhale 2 puffs into the lungs every 6 (six) hours as needed for wheezing or shortness of breath. 8 g 6   Ascorbic Acid (VITAMIN C PO) Take 1 tablet by mouth daily.     Cholecalciferol (VITAMIN D3) 1000 UNITS CAPS Take 1,000 Units by mouth daily.     Coenzyme Q10 (CO Q 10 PO) Take 1 tablet by mouth daily.     Cyanocobalamin (VITAMIN B-12 PO) Take 1 tablet by mouth daily.     cyclobenzaprine (FLEXERIL) 5 MG tablet Take 1 tablet (5 mg total) by mouth 3 (three) times daily as needed for muscle spasms. 30 tablet 0   docusate sodium (COLACE) 100 MG capsule Take 1 capsule (100 mg total) by mouth every 12 (twelve) hours. 60 capsule 0   ezetimibe (ZETIA) 10 MG tablet Take 1 tablet (10 mg total) by mouth daily. 90 tablet 3   fluticasone (FLONASE) 50 MCG/ACT nasal spray Place 1-2 sprays into both nostrils daily. (Patient taking differently: Place 1-2 sprays into both nostrils daily as needed for allergies.) 16 g 6   ibuprofen (ADVIL) 200 MG tablet Take 400 mg by  mouth every 6 (six) hours as needed for moderate pain.     KRILL OIL PO Take 1 tablet by mouth daily.     lisinopril-hydrochlorothiazide (ZESTORETIC) 20-12.5 MG tablet Take 2 tablets by mouth once daily 180 tablet 0   MAGNESIUM PO Take 1 tablet by mouth daily.     morphine (MS CONTIN) 15 MG 12 hr tablet Take 1 tablet (15 mg total) by mouth every 12 (twelve) hours. 30 tablet 0   Multiple Vitamins-Minerals (CENTRUM SILVER PO) Take 1 tablet by mouth daily.     Multiple Vitamins-Minerals (ZINC PO) Take 1 tablet by mouth daily.      Naphazoline-Pheniramine (OPCON-A) 0.027-0.315 % SOLN Place 1 drop into both eyes daily as needed (redness).     nicotine (NICODERM CQ - DOSED IN MG/24 HOURS) 14 mg/24hr patch 14mg /day for 6 weeks then 7mg /day x 2 weeks 28 patch 1   omeprazole (PRILOSEC) 20 MG capsule Take 1 capsule by mouth once daily 90 capsule 0   oxyCODONE (OXY IR/ROXICODONE) 5 MG immediate release tablet Take 1 tablet (5 mg total) by mouth every 4 (four) hours as needed for severe pain. 60 tablet 0   prochlorperazine (COMPAZINE) 10 MG tablet Take 1 tablet (10 mg total) by mouth every 6 (six) hours as needed for nausea or vomiting. 30 tablet 0   rosuvastatin (CRESTOR) 5 MG tablet TAKE 1 TABLET BY MOUTH AT BEDTIME 90 tablet 0   TURMERIC PO Take 1 tablet by mouth 3 (three) times a week.     umeclidinium-vilanterol (ANORO ELLIPTA) 62.5-25 MCG/ACT AEPB Inhale 1 puff into the lungs daily. (Patient taking differently: Inhale 1 puff into the lungs daily as needed (shortness of breath).) 180 each 3   No current facility-administered medications for this visit.    SURGICAL HISTORY:  Past Surgical History:  Procedure Laterality Date   BRONCHIAL BIOPSY  05/26/2022   Procedure: BRONCHIAL BIOPSIES;  Surgeon: Josephine Igo, DO;  Location: [facility];  Service: Pulmonary;;   BRONCHIAL BRUSHINGS  05/26/2022   Procedure: BRONCHIAL BRUSHINGS;  Surgeon: Josephine Igo, DO;  Location: [facility];  Service: Pulmonary;;   NO PAST SURGERIES     VIDEO BRONCHOSCOPY  05/26/2022   Procedure: VIDEO BRONCHOSCOPY WITHOUT FLUORO;  Surgeon: Josephine Igo, DO;  Location: [facility];  Service: Pulmonary;;    REVIEW OF SYSTEMS:  A comprehensive review of systems was negative except for: Gastrointestinal: positive for constipation   PHYSICAL EXAMINATION: General appearance: alert, cooperative, and no distress Head: Normocephalic, without obvious abnormality, atraumatic Neck: no adenopathy, no JVD, supple, symmetrical, trachea midline, and  thyroid not enlarged, symmetric, no tenderness/mass/nodules Lymph nodes: Cervical, supraclavicular, and axillary nodes normal. Resp: clear to auscultation bilaterally Back: symmetric, no curvature. ROM normal. No CVA tenderness. Cardio: regular rate and rhythm, S1, S2 normal, no murmur, click, rub or gallop GI: soft, non-tender; bowel sounds normal; no masses,  no organomegaly Extremities: extremities normal, atraumatic, no cyanosis or edema  ECOG PERFORMANCE STATUS: 1 - Symptomatic but completely ambulatory  Blood pressure 126/73, pulse 85, temperature 98.4 F (36.9 C), temperature source Oral, resp. rate 16, weight 157 lb 9.6 oz (71.5 kg), SpO2 98 %.  LABORATORY DATA: Lab Results  Component Value Date   WBC 19.3 (H) 06/22/2022   HGB 13.4 06/22/2022   HCT 40.2 06/22/2022   MCV 88.5 06/22/2022   PLT 195 06/22/2022      Chemistry      Component Value Date/Time   NA 138 06/15/2022 1322  NA 145 (H) 10/21/2021 1038   K 4.1 06/15/2022 1322   CL 100 06/15/2022 1322   CO2 27 06/15/2022 1322   BUN 27 (H) 06/15/2022 1322   BUN 19 10/21/2021 1038   CREATININE 1.35 (H) 06/15/2022 1322   CREATININE 1.07 08/27/2014 1511      Component Value Date/Time   CALCIUM 10.2 06/15/2022 1322   ALKPHOS 79 06/15/2022 1322   AST 18 06/15/2022 1322   ALT 15 06/15/2022 1322   BILITOT 0.4 06/15/2022 1322       RADIOGRAPHIC STUDIES: MR BRAIN W WO CONTRAST  Result Date: 06/09/2022 CLINICAL DATA:  Non-small cell lung cancer (NSCLC), staging. EXAM: MRI HEAD WITHOUT AND WITH CONTRAST TECHNIQUE: Multiplanar, multiecho pulse sequences of the brain and surrounding structures were obtained without and with intravenous contrast. CONTRAST:  7.35mL GADAVIST GADOBUTROL 1 MMOL/ML IV SOLN COMPARISON:  None Available. FINDINGS: Brain: No acute infarct or hemorrhage. Mild chronic small-vessel disease. No hydrocephalus or extra-axial collection. No foci of abnormal susceptibility. No mass or abnormal enhancement.  Vascular: Normal flow voids and enhancement. Skull and upper cervical spine: Normal marrow signal and enhancement. Sinuses/Orbits: Unremarkable. Other: None. IMPRESSION: No evidence of metastatic disease to the brain. Electronically Signed   By: Orvan Falconer M.D.   On: 06/09/2022 15:40   NM PET Image Initial (PI) Skull Base To Thigh  Result Date: 06/08/2022 CLINICAL DATA:  Initial treatment strategy for perihilar left lung mass. EXAM: NUCLEAR MEDICINE PET SKULL BASE TO THIGH TECHNIQUE: 9.1 mCi F-18 FDG was injected intravenously. Full-ring PET imaging was performed from the skull base to thigh after the radiotracer. CT data was obtained and used for attenuation correction and anatomic localization. Fasting blood glucose: 120 mg/dl COMPARISON:  81/19/1478 chest CT. FINDINGS: Mediastinal blood pool activity: SUV max 2.8 Liver activity: SUV max NA NECK: No hypermetabolic lymph nodes in the neck. Asymmetric hypermetabolism in the posterior right nasopharynx without discrete mass correlate on noncontrast CT images with max SUV 6.7. Incidental CT findings: None. CHEST: Intensely hypermetabolic central perihilar left upper lobe lung mass with max SUV 38.1, poorly delineated on the noncontrast CT images and better visualized on the recent contrast enhanced chest CT study, measuring approximately 3.1 x 1.7 cm (series 4/image 60), with associated occlusion of the apicoposterior segmental left upper lobe bronchus. No additional hypermetabolic pulmonary findings. Intensely hypermetabolic mildly enlarged 1.3 cm AP window node with max SUV 35.0 (series 4/image 64). No additional enlarged or hypermetabolic mediastinal or hilar nodes. No enlarged or hypermetabolic axillary nodes. Incidental CT findings: Atherosclerotic nonaneurysmal thoracic aorta. Similar mild patchy tree-in-bud opacities in central left upper lobe. No additional significant pulmonary nodules. ABDOMEN/PELVIS: No abnormal hypermetabolic activity within the  liver, pancreas, adrenal glands, or spleen. No hypermetabolic lymph nodes in the abdomen or pelvis. Intensely hypermetabolic 1.3 cm lower right retroperitoneal 1.3 cm soft tissue nodule with max SUV 30.7 (series 4/image 139). Incidental CT findings: Hypodense subcentimeter posterior lower left renal cortical lesion, too small to characterize, for which no follow-up imaging is recommended. Atherosclerotic nonaneurysmal abdominal aorta. SKELETON: Intensely hypermetabolic lytic posterior left T8 vertebral and posterior element 2.1 cm lesion with max SUV 43.1 (series 4/image 80). Intensely hypermetabolic L4 vertebral lytic 2.3 cm lesion with max SUV 52.1 (series 4/image 140).1 Incidental CT findings: None. IMPRESSION: 1. Intensely hypermetabolic central perihilar left upper lobe lung mass with associated occlusion of the apicoposterior segmental left upper lobe bronchus, compatible with primary bronchogenic carcinoma, better delineated on recent 05/12/2022 contrast-enhanced chest CT study. 2. Intensely hypermetabolic  mildly enlarged AP window lymph node, compatible with ipsilateral mediastinal nodal metastasis. 3. Intensely hypermetabolic lytic spinal metastases at the T8 and L4 levels. 4. Intensely hypermetabolic 1.3 cm lower right retroperitoneal soft tissue nodule, compatible with soft tissue metastasis. 5. Asymmetric hypermetabolism in the posterior right nasopharynx without discrete mass correlate on noncontrast CT images, indeterminate, cannot exclude a mucosal lesion. Suggest correlation with direct visualization. 6.  Aortic Atherosclerosis (ICD10-I70.0). Electronically Signed   By: Delbert Phenix M.D.   On: 06/08/2022 15:35   CT PELVIS WO CONTRAST  Result Date: 06/07/2022 CLINICAL DATA:  Suspected lung carcinoma, possible metastatic disease EXAM: CT PELVIS WITHOUT CONTRAST TECHNIQUE: Multidetector CT imaging of the pelvis was performed following the standard protocol without intravenous contrast. RADIATION  DOSE REDUCTION: This exam was performed according to the departmental dose-optimization program which includes automated exposure control, adjustment of the mA and/or kV according to patient size and/or use of iterative reconstruction technique. COMPARISON:  PET-CT 06/04/2022 and previous FINDINGS: Urinary Tract:  Urinary bladder is distended. Bowel:  Visualized portions of small bowel and colon are nondilated. Vascular/Lymphatic: Scattered aortoiliac calcified atheromatous plaque without aneurysm. No pelvic adenopathy. Reproductive:  Prostate enlargement. Other:  No pelvic ascites. Musculoskeletal: Facet DJD L4-5 right greater than left. 2.2 cm lytic lesion in the L4 vertebral body, incompletely visualized, corresponding to hypermetabolic focus on PET-CT. No other worrisome bone lesions. Marked narrowing of the L5-S1 interspace with endplate spurring and discogenic sclerosis, with partially calcified protrusion. Mild facet disease bilaterally L5-S1. No fracture or dislocation. IMPRESSION: 1. 2.2 cm lytic lesion in the L4 vertebral body, corresponding to hypermetabolic focus on PET-CT, consistent with metastatic disease. 2.  Aortic Atherosclerosis (ICD10-I70.0). Electronically Signed   By: Corlis Leak M.D.   On: 06/07/2022 09:42   CT Thoracic Spine Wo Contrast  Result Date: 06/07/2022 CLINICAL DATA:  Back pain radiating bilaterally. EXAM: CT THORACIC AND LUMBAR SPINE WITHOUT CONTRAST TECHNIQUE: Multidetector CT imaging of the thoracic and lumbar spine was performed without contrast. Multiplanar CT image reconstructions were also generated. RADIATION DOSE REDUCTION: This exam was performed according to the departmental dose-optimization program which includes automated exposure control, adjustment of the mA and/or kV according to patient size and/or use of iterative reconstruction technique. COMPARISON:  PET CT from 3 days ago FINDINGS: CT THORACIC SPINE FINDINGS Alignment: Unremarkable Vertebrae: Lytic lesion in  the left posterior aspect of the T8 body extending into the pedicle with cortical breakthrough at the level of the body and inferior pedicle cortex. Tumor infiltrates the epidural space and left T8-9 foramen. No visible cord compression. Paraspinal and other soft tissues: Reference recent PET CT. Disc levels: No significant degenerative change. CT LUMBAR SPINE FINDINGS Segmentation: 5 lumbar type vertebrae. Alignment: Normal. Vertebrae: Lytic lesion in the right aspect of the L4 body. No acute fracture. There is no visible extraosseous extension Paraspinal and other soft tissues: Reference recent PET CT Disc levels: Disc space narrowing with endplate and facet spurring especially at L3-4 and below. Disc degeneration at L5-S1 is advanced. Moderate foraminal narrowing at these levels. IMPRESSION: Metastatic lesions in the T8 and L4 vertebrae, more notable at T8 where there is extraosseous extension with left epidural and T8-9 foraminal infiltration. Electronically Signed   By: Tiburcio Pea M.D.   On: 06/07/2022 09:41   CT Lumbar Spine Wo Contrast  Result Date: 06/07/2022 CLINICAL DATA:  Back pain radiating bilaterally. EXAM: CT THORACIC AND LUMBAR SPINE WITHOUT CONTRAST TECHNIQUE: Multidetector CT imaging of the thoracic and lumbar spine was performed without  contrast. Multiplanar CT image reconstructions were also generated. RADIATION DOSE REDUCTION: This exam was performed according to the departmental dose-optimization program which includes automated exposure control, adjustment of the mA and/or kV according to patient size and/or use of iterative reconstruction technique. COMPARISON:  PET CT from 3 days ago FINDINGS: CT THORACIC SPINE FINDINGS Alignment: Unremarkable Vertebrae: Lytic lesion in the left posterior aspect of the T8 body extending into the pedicle with cortical breakthrough at the level of the body and inferior pedicle cortex. Tumor infiltrates the epidural space and left T8-9 foramen. No  visible cord compression. Paraspinal and other soft tissues: Reference recent PET CT. Disc levels: No significant degenerative change. CT LUMBAR SPINE FINDINGS Segmentation: 5 lumbar type vertebrae. Alignment: Normal. Vertebrae: Lytic lesion in the right aspect of the L4 body. No acute fracture. There is no visible extraosseous extension Paraspinal and other soft tissues: Reference recent PET CT Disc levels: Disc space narrowing with endplate and facet spurring especially at L3-4 and below. Disc degeneration at L5-S1 is advanced. Moderate foraminal narrowing at these levels. IMPRESSION: Metastatic lesions in the T8 and L4 vertebrae, more notable at T8 where there is extraosseous extension with left epidural and T8-9 foraminal infiltration. Electronically Signed   By: Tiburcio Pea M.D.   On: 06/07/2022 09:41    ASSESSMENT AND PLAN: This is a very pleasant 67 years old African-American male with Stage IV (T2a, N2, M1b) non-small cell lung cancer, squamous cell carcinoma presented with left upper lobe lung mass in addition to AP window lymphadenopathy and suspicious bone metastasis to the T8 and L4 vertebrae as well as right retroperitoneal metastatic nodule diagnosed in April 2024.  Molecular studies by [id number] showed no actionable mutations and PD-L1 expression of 70%. The patient is currently undergoing palliative combination of systemic chemotherapy with carboplatin for AUC of 5, paclitaxel 175 Mg/M2 and immunotherapy with Libtayo (Cempilimab) 350 Mg IV every 3 weeks with Neulasta support for 4 cycles followed by maintenance treatment with Libtayo (Cempilimab) of the patient has no disease progression after cycle #4.  He is status post 1 cycle. He tolerated the first cycle of his treatment well except for the aching pain and fatigue from the Neulasta injection. I recommended for him to continue his treatment as planned and he is expected to start cycle #2 in 2 weeks. For the back pain, he is seen by  radiation oncology for consideration of palliative radiotherapy to the T8 and L4 vertebral lesions.  He is also seen by the palliative care team for pain management. For the bone metastasis, he is currently on treatment with Xgeva 120 mg subcutaneously every 6 weeks. The patient was advised to call immediately if he has any other concerning symptoms in the interval. The patient voices understanding of current disease status and treatment options and is in agreement with the current care plan.  All questions were answered. The patient knows to call the clinic with any problems, questions or concerns. We can certainly see the patient much sooner if necessary.  The total time spent in the appointment was 20 minutes.  Disclaimer: This note was dictated with voice recognition software. Similar sounding words can inadvertently be transcribed and may not be corrected upon review.

## 2022-06-22 NOTE — Telephone Encounter (Signed)
I left pt a VM stating we only sending in a 30 days supply no refills and he needs an apt w/ Dr Neva Seat . LOV was 06/12/2021

## 2022-06-22 NOTE — Progress Notes (Signed)
NN met with pt today at his 1 week f/u appt after receiving his first dose of chemotherapy. Pt is doing well, only complaint was of pain caused by his pegfligrastim injection on D3, which pt took tylenol for as needed.  Pt denies additional assistance from NN at this time, but pt has NN contact information should any needs arise.

## 2022-06-23 DIAGNOSIS — Z5111 Encounter for antineoplastic chemotherapy: Secondary | ICD-10-CM | POA: Diagnosis not present

## 2022-06-24 NOTE — Progress Notes (Unsigned)
Palliative Medicine [facility]  Telephone:(336) [phone] Fax:(336) [phone]   Name: Albert Hardy. Date: 06/24/2022 MRN: [id number]  DOB: [birth year]  Patient Care Team: [name], MD as PCP - General (Family Medicine)    INTERVAL HISTORY: Albert Hardy. is a 67 y.o. 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 Palliative ask to see for symptom management and goals of care   SOCIAL HISTORY:     reports that he quit smoking about 10 years ago. His smoking use included cigarettes. He has a 30.00 pack-year smoking history. He has never used smokeless tobacco. He reports current alcohol use. He reports current drug use. Frequency: 1.00 time per week. Drug: Marijuana.  ADVANCE DIRECTIVES:  None on file  CODE STATUS: Full code  PAST MEDICAL HISTORY: Past Medical History:  Diagnosis Date   Aortic regurgitation    Aortic stenosis 04/04/2018   Atherosclerosis of native arteries of the extremities with intermittent claudication 09/08/2013   Bilateral impacted cerumen 10/13/2018   Bilateral sensorineural hearing loss 12/14/2018   Dyspnea    Former moderate cigarette smoker (10-19 per day) 05/20/2012   Quit smoking Nov 2013 when hospitalized w/ HTN and chest pain(diagnosed w/ valvular heart disease)   GERD (gastroesophageal reflux disease)    Heart murmur    Hyperlipidemia    Hypertension    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  MEDICATIONS:  Current Outpatient Medications  Medication Sig Dispense Refill   albuterol (VENTOLIN HFA) 108 (90 Base) MCG/ACT inhaler Inhale 2 puffs into the lungs every 6 (six) hours as needed for wheezing or shortness of breath. 8 g 6   Ascorbic Acid  (VITAMIN C PO) Take 1 tablet by mouth daily.     Cholecalciferol (VITAMIN D3) 1000 UNITS CAPS Take 1,000 Units by mouth daily.     Coenzyme Q10 (CO Q 10 PO) Take 1 tablet by mouth daily.     Cyanocobalamin (VITAMIN B-12 PO) Take 1 tablet by mouth daily.     cyclobenzaprine (FLEXERIL) 5 MG tablet Take 1 tablet (5 mg total) by mouth 3 (three) times daily as needed for muscle spasms. 30 tablet 0   docusate sodium (COLACE) 100 MG capsule Take 1 capsule (100 mg total) by mouth every 12 (twelve) hours. 60 capsule 0   ezetimibe (ZETIA) 10 MG tablet Take 1 tablet (10 mg total) by mouth daily. 90 tablet 3   fluticasone (FLONASE) 50 MCG/ACT nasal spray Place 1-2 sprays into both nostrils daily. (Patient taking differently: Place 1-2 sprays into both nostrils daily as needed for allergies.) 16 g 6   ibuprofen (ADVIL) 200 MG tablet Take 400 mg by mouth every 6 (six) hours as needed for moderate pain.     KRILL OIL PO Take 1 tablet by mouth daily.     lisinopril-hydrochlorothiazide (ZESTORETIC) 20-12.5 MG tablet Take 2 tablets by mouth once daily 180 tablet 0   MAGNESIUM PO Take 1 tablet by mouth daily.     morphine (MS CONTIN) 15 MG 12 hr tablet Take 1 tablet (15 mg total) by mouth every 12 (twelve) hours. 30 tablet 0   Multiple Vitamins-Minerals (CENTRUM SILVER PO) Take 1 tablet by mouth daily.     Multiple Vitamins-Minerals (  ZINC PO) Take 1 tablet by mouth daily.     Naphazoline-Pheniramine (OPCON-A) 0.027-0.315 % SOLN Place 1 drop into both eyes daily as needed (redness).     nicotine (NICODERM CQ - DOSED IN MG/24 HOURS) 14 mg/24hr patch 14mg /day for 6 weeks then 7mg /day x 2 weeks 28 patch 1   omeprazole (PRILOSEC) 20 MG capsule Take 1 capsule (20 mg total) by mouth daily. 30 capsule 0   oxyCODONE (OXY IR/ROXICODONE) 5 MG immediate release tablet Take 1 tablet (5 mg total) by mouth every 4 (four) hours as needed for severe pain. 60 tablet 0   prochlorperazine (COMPAZINE) 10 MG tablet Take 1 tablet (10 mg  total) by mouth every 6 (six) hours as needed for nausea or vomiting. 30 tablet 0   rosuvastatin (CRESTOR) 5 MG tablet Take 1 tablet (5 mg total) by mouth at bedtime. 30 tablet 0   TURMERIC PO Take 1 tablet by mouth 3 (three) times a week.     umeclidinium-vilanterol (ANORO ELLIPTA) 62.5-25 MCG/ACT AEPB Inhale 1 puff into the lungs daily. (Patient taking differently: Inhale 1 puff into the lungs daily as needed (shortness of breath).) 180 each 3   No current facility-administered medications for this visit.    VITAL SIGNS: There were no vitals taken for this visit. There were no vitals filed for this visit.  Estimated body mass index is 23.27 kg/m as calculated from the following:   Height as of 06/17/22: 5\' 9"  (1.753 m).   Weight as of 06/22/22: 157 lb 9.6 oz (71.5 kg).   PERFORMANCE STATUS (ECOG) : 1 - Symptomatic but completely ambulatory   Physical Exam General: NAD Cardiovascular: regular rate and rhythm Pulmonary: normal breathing pattern  Extremities: no edema, no joint deformities Skin: no rashes Neurological: AAO x4  IMPRESSION: Mr. Albert Hardy presents to clinic today for symptom management follow-up. Is ambulatory. No family present. States he is doing much better than previous weeks. Denies nausea, vomiting, or diarrhea.  Occasional constipation.  Is remaining as active as possible.  Neoplasm related pain Mr. Albert Hardy reports his pain is well-controlled on current regimen.  Some days are better than others.     We discussed his regimen at length. He is currently taking MS Contin 15 mg every 12 hours, Oxy IR 5mg  every 4-6  hours as needed for breakthrough pain.  Flexeril as needed for spasms.  He is tolerating MS Contin without difficulty.   No changes to current regimen at this time.  Will continue to closely monitor.   Constipation Controlled with daily regimen.  Decreased appetite/Weight loss  Appetite is slowly improving.  Some days are better than others.  Weight is  stable at 156 pounds.  Goals of Care   06/18/22-  We discussed his current illness and what it means in the larger context of hsi on-going co-morbidities. Natural disease trajectory and expectations were discussed.  Mr. Viana and his wife are realistic in their expectations and understanding of his incurable cancer. He knows all treatment is palliative focused. Wishes to continue taking things one day at a time focusing on his quality of life allowing him every opportunity to continue to thrive.   We discussed Her current illness and what it means in the larger context of Her on-going co-morbidities. Natural disease trajectory and expectations were discussed.  I discussed the importance of continued conversation with family and their medical providers regarding overall plan of care and treatment options, ensuring decisions are within the context of the patients values  and GOCs.  PLAN:  Pepcid daily Oxy IR 5mg  every 4-6 hours as needed for breakthrough pain  MS Contin 15 mg every 12 hours.  Tolerating well.  Flexeril as needed Pain contract on file I will plan to see patient back in 3-4 weeks in collaboration to other oncology appointments.    Patient expressed understanding and was in agreement with this plan. He also understands that He can call the clinic at any time with any questions, concerns, or complaints.   Any controlled substances utilized were prescribed in the context of palliative care. PDMP has been reviewed.    Visit consisted of counseling and education dealing with the complex and emotionally intense issues of symptom management and palliative care in the setting of serious and potentially life-threatening illness.Greater than 50%  of this time was spent counseling and coordinating care related to the above assessment and plan.  Willette Alma, AGPCNP-BC  Palliative Medicine Team/[facility]  *Please note that this is a verbal dictation therefore  any spelling or grammatical errors are due to the "Dragon Medical One" system interpretation.

## 2022-06-25 ENCOUNTER — Other Ambulatory Visit: Payer: Self-pay

## 2022-06-25 ENCOUNTER — Ambulatory Visit
Admission: RE | Admit: 2022-06-25 | Discharge: 2022-06-25 | Disposition: A | Discharge status: Home / Self Care | Payer: Medicare Other | Source: Ambulatory Visit | Attending: Radiation Oncology | Admitting: Radiation Oncology

## 2022-06-25 ENCOUNTER — Encounter: Payer: Self-pay | Admitting: Nurse Practitioner

## 2022-06-25 ENCOUNTER — Inpatient Hospital Stay (HOSPITAL_BASED_OUTPATIENT_CLINIC_OR_DEPARTMENT_OTHER): Payer: Medicare Other | Admitting: Nurse Practitioner

## 2022-06-25 VITALS — BP 111/69 | HR 70 | Temp 98.9°F | Resp 18 | Ht 69.0 in | Wt 156.3 lb

## 2022-06-25 DIAGNOSIS — C3492 Malignant neoplasm of unspecified part of left bronchus or lung: Secondary | ICD-10-CM | POA: Diagnosis not present

## 2022-06-25 DIAGNOSIS — Z515 Encounter for palliative care: Secondary | ICD-10-CM

## 2022-06-25 DIAGNOSIS — G893 Neoplasm related pain (acute) (chronic): Secondary | ICD-10-CM | POA: Diagnosis not present

## 2022-06-25 DIAGNOSIS — Z5111 Encounter for antineoplastic chemotherapy: Secondary | ICD-10-CM | POA: Diagnosis not present

## 2022-06-25 DIAGNOSIS — K5903 Drug induced constipation: Secondary | ICD-10-CM

## 2022-06-25 DIAGNOSIS — R630 Anorexia: Secondary | ICD-10-CM

## 2022-06-25 LAB — RAD ONC ARIA SESSION SUMMARY

## 2022-06-26 ENCOUNTER — Ambulatory Visit: Payer: Medicare Other

## 2022-06-29 ENCOUNTER — Ambulatory Visit
Admission: RE | Admit: 2022-06-29 | Discharge: 2022-06-29 | Disposition: A | Discharge status: Home / Self Care | Payer: Medicare Other | Source: Ambulatory Visit | Attending: Radiation Oncology | Admitting: Radiation Oncology

## 2022-06-29 ENCOUNTER — Inpatient Hospital Stay: Payer: Medicare Other

## 2022-06-29 ENCOUNTER — Other Ambulatory Visit: Payer: Self-pay

## 2022-06-29 DIAGNOSIS — C3492 Malignant neoplasm of unspecified part of left bronchus or lung: Secondary | ICD-10-CM

## 2022-06-29 DIAGNOSIS — Z5111 Encounter for antineoplastic chemotherapy: Secondary | ICD-10-CM | POA: Diagnosis not present

## 2022-06-29 LAB — CBC WITH DIFFERENTIAL (CANCER CENTER ONLY)
Abs Immature Granulocytes: 0.09 10*3/uL — ABNORMAL HIGH (ref 0.00–0.07)
Basophils Absolute: 0.1 10*3/uL (ref 0.0–0.1)
Basophils Relative: 1 %
Eosinophils Absolute: 0 10*3/uL (ref 0.0–0.5)
Eosinophils Relative: 0 %
HCT: 37.4 % — ABNORMAL LOW (ref 39.0–52.0)
Hemoglobin: 12.6 g/dL — ABNORMAL LOW (ref 13.0–17.0)
Immature Granulocytes: 1 %
Lymphocytes Relative: 17 %
Lymphs Abs: 1.5 10*3/uL (ref 0.7–4.0)
MCH: 29.9 pg (ref 26.0–34.0)
MCHC: 33.7 g/dL (ref 30.0–36.0)
MCV: 88.6 fL (ref 80.0–100.0)
Monocytes Absolute: 0.5 10*3/uL (ref 0.1–1.0)
Monocytes Relative: 6 %
Neutro Abs: 6.6 10*3/uL (ref 1.7–7.7)
Neutrophils Relative %: 75 %
Platelet Count: 235 10*3/uL (ref 150–400)
RBC: 4.22 MIL/uL (ref 4.22–5.81)
RDW: 13.4 % (ref 11.5–15.5)
WBC Count: 8.8 10*3/uL (ref 4.0–10.5)
nRBC: 0 % (ref 0.0–0.2)

## 2022-06-29 LAB — CMP (CANCER CENTER ONLY)
ALT: 28 U/L (ref 0–44)
AST: 18 U/L (ref 15–41)
Albumin: 4.2 g/dL (ref 3.5–5.0)
Alkaline Phosphatase: 122 U/L (ref 38–126)
Anion gap: 7 (ref 5–15)
BUN: 16 mg/dL (ref 8–23)
CO2: 28 mmol/L (ref 22–32)
Calcium: 8.8 mg/dL — ABNORMAL LOW (ref 8.9–10.3)
Chloride: 102 mmol/L (ref 98–111)
Creatinine: 1.23 mg/dL (ref 0.61–1.24)
GFR, Estimated: >60 mL/min (ref >60)
Glucose, Bld: 125 mg/dL — ABNORMAL HIGH (ref 70–99)
Potassium: 4.5 mmol/L (ref 3.5–5.1)
Sodium: 137 mmol/L (ref 135–145)
Total Bilirubin: 0.2 mg/dL — ABNORMAL LOW (ref 0.3–1.2)
Total Protein: 7.9 g/dL (ref 6.5–8.1)

## 2022-06-29 LAB — RAD ONC ARIA SESSION SUMMARY
Course Elapsed Days: 4
Plan Fractions Treated to Date: 2
Plan Fractions Treated to Date: 2
Plan Prescribed Dose Per Fraction: 3 Gy
Plan Prescribed Dose Per Fraction: 3 Gy
Plan Total Fractions Prescribed: 10
Plan Total Fractions Prescribed: 10
Plan Total Prescribed Dose: 30 Gy
Plan Total Prescribed Dose: 30 Gy
Reference Point Dosage Given to Date: 6 Gy
Reference Point Dosage Given to Date: 6 Gy
Reference Point Session Dosage Given: 3 Gy
Reference Point Session Dosage Given: 3 Gy
Session Number: 2

## 2022-06-30 ENCOUNTER — Ambulatory Visit
Admission: RE | Admit: 2022-06-30 | Discharge: 2022-06-30 | Disposition: A | Discharge status: Home / Self Care | Payer: Medicare Other | Source: Ambulatory Visit | Attending: Radiation Oncology | Admitting: Radiation Oncology

## 2022-06-30 ENCOUNTER — Other Ambulatory Visit: Payer: Self-pay

## 2022-06-30 DIAGNOSIS — Z5111 Encounter for antineoplastic chemotherapy: Secondary | ICD-10-CM | POA: Diagnosis not present

## 2022-06-30 LAB — RAD ONC ARIA SESSION SUMMARY
Course Elapsed Days: 5
Plan Fractions Treated to Date: 3
Plan Fractions Treated to Date: 3
Plan Prescribed Dose Per Fraction: 3 Gy
Plan Prescribed Dose Per Fraction: 3 Gy
Plan Total Fractions Prescribed: 10
Plan Total Fractions Prescribed: 10
Plan Total Prescribed Dose: 30 Gy
Plan Total Prescribed Dose: 30 Gy
Reference Point Dosage Given to Date: 9 Gy
Reference Point Dosage Given to Date: 9 Gy
Reference Point Session Dosage Given: 3 Gy
Reference Point Session Dosage Given: 3 Gy
Session Number: 3

## 2022-07-01 ENCOUNTER — Ambulatory Visit
Admission: RE | Admit: 2022-07-01 | Discharge: 2022-07-01 | Disposition: A | Discharge status: Home / Self Care | Payer: Medicare Other | Source: Ambulatory Visit | Attending: Radiation Oncology | Admitting: Radiation Oncology

## 2022-07-01 ENCOUNTER — Other Ambulatory Visit: Payer: Self-pay

## 2022-07-01 DIAGNOSIS — Z5111 Encounter for antineoplastic chemotherapy: Secondary | ICD-10-CM | POA: Diagnosis not present

## 2022-07-01 LAB — RAD ONC ARIA SESSION SUMMARY
Course Elapsed Days: 6
Plan Fractions Treated to Date: 4
Plan Fractions Treated to Date: 4
Plan Prescribed Dose Per Fraction: 3 Gy
Plan Prescribed Dose Per Fraction: 3 Gy
Plan Total Fractions Prescribed: 10
Plan Total Fractions Prescribed: 10
Plan Total Prescribed Dose: 30 Gy
Plan Total Prescribed Dose: 30 Gy
Reference Point Dosage Given to Date: 12 Gy
Reference Point Dosage Given to Date: 12 Gy
Reference Point Session Dosage Given: 3 Gy
Reference Point Session Dosage Given: 3 Gy
Session Number: 4

## 2022-07-02 ENCOUNTER — Ambulatory Visit
Admission: RE | Admit: 2022-07-02 | Discharge: 2022-07-02 | Disposition: A | Discharge status: Home / Self Care | Payer: Medicare Other | Source: Ambulatory Visit | Attending: Radiation Oncology | Admitting: Radiation Oncology

## 2022-07-02 ENCOUNTER — Other Ambulatory Visit: Payer: Self-pay

## 2022-07-02 DIAGNOSIS — Z5111 Encounter for antineoplastic chemotherapy: Secondary | ICD-10-CM | POA: Diagnosis not present

## 2022-07-02 LAB — RAD ONC ARIA SESSION SUMMARY
Course Elapsed Days: 7
Plan Fractions Treated to Date: 5
Plan Fractions Treated to Date: 5
Plan Prescribed Dose Per Fraction: 3 Gy
Plan Prescribed Dose Per Fraction: 3 Gy
Plan Total Fractions Prescribed: 10
Plan Total Fractions Prescribed: 10
Plan Total Prescribed Dose: 30 Gy
Plan Total Prescribed Dose: 30 Gy
Reference Point Dosage Given to Date: 15 Gy
Reference Point Dosage Given to Date: 15 Gy
Reference Point Session Dosage Given: 3 Gy
Reference Point Session Dosage Given: 3 Gy
Session Number: 5

## 2022-07-03 ENCOUNTER — Ambulatory Visit
Admission: RE | Admit: 2022-07-03 | Discharge: 2022-07-03 | Disposition: A | Discharge status: Home / Self Care | Payer: Medicare Other | Source: Ambulatory Visit | Attending: Radiation Oncology | Admitting: Radiation Oncology

## 2022-07-03 ENCOUNTER — Other Ambulatory Visit: Payer: Self-pay | Admitting: Nurse Practitioner

## 2022-07-03 ENCOUNTER — Other Ambulatory Visit: Payer: Self-pay

## 2022-07-03 DIAGNOSIS — C3492 Malignant neoplasm of unspecified part of left bronchus or lung: Secondary | ICD-10-CM

## 2022-07-03 DIAGNOSIS — G893 Neoplasm related pain (acute) (chronic): Secondary | ICD-10-CM

## 2022-07-03 DIAGNOSIS — Z5111 Encounter for antineoplastic chemotherapy: Secondary | ICD-10-CM | POA: Diagnosis not present

## 2022-07-03 DIAGNOSIS — Z515 Encounter for palliative care: Secondary | ICD-10-CM

## 2022-07-03 LAB — RAD ONC ARIA SESSION SUMMARY
Course Elapsed Days: 8
Plan Fractions Treated to Date: 6
Plan Fractions Treated to Date: 6
Plan Prescribed Dose Per Fraction: 3 Gy
Plan Prescribed Dose Per Fraction: 3 Gy
Plan Total Fractions Prescribed: 10
Plan Total Fractions Prescribed: 10
Plan Total Prescribed Dose: 30 Gy
Plan Total Prescribed Dose: 30 Gy
Reference Point Dosage Given to Date: 18 Gy
Reference Point Dosage Given to Date: 18 Gy
Reference Point Session Dosage Given: 3 Gy
Reference Point Session Dosage Given: 3 Gy
Session Number: 6

## 2022-07-03 MED ORDER — MORPHINE SULFATE ER 15 MG PO TBCR
15.0000 mg | EXTENDED_RELEASE_TABLET | Freq: Two times a day (BID) | ORAL | 0 refills | Status: DC
Start: 2022-07-03 — End: 2022-08-09

## 2022-07-03 MED ORDER — OXYCODONE HCL 5 MG PO TABS
5.0000 mg | ORAL_TABLET | ORAL | 0 refills | Status: DC | PRN
Start: 2022-07-03 — End: 2022-08-09

## 2022-07-07 ENCOUNTER — Other Ambulatory Visit: Payer: Self-pay

## 2022-07-07 ENCOUNTER — Ambulatory Visit: Payer: Medicare Other

## 2022-07-07 ENCOUNTER — Ambulatory Visit: Payer: Medicare Other | Admitting: Internal Medicine

## 2022-07-07 ENCOUNTER — Other Ambulatory Visit: Payer: Medicare Other

## 2022-07-07 ENCOUNTER — Ambulatory Visit
Admission: RE | Admit: 2022-07-07 | Discharge: 2022-07-07 | Disposition: A | Discharge status: Home / Self Care | Payer: Medicare Other | Source: Ambulatory Visit | Attending: Radiation Oncology | Admitting: Radiation Oncology

## 2022-07-07 DIAGNOSIS — Z5111 Encounter for antineoplastic chemotherapy: Secondary | ICD-10-CM | POA: Diagnosis not present

## 2022-07-07 LAB — RAD ONC ARIA SESSION SUMMARY
Course Elapsed Days: 12
Plan Fractions Treated to Date: 7
Plan Fractions Treated to Date: 7
Plan Prescribed Dose Per Fraction: 3 Gy
Plan Prescribed Dose Per Fraction: 3 Gy
Plan Total Fractions Prescribed: 10
Plan Total Fractions Prescribed: 10
Plan Total Prescribed Dose: 30 Gy
Plan Total Prescribed Dose: 30 Gy
Reference Point Dosage Given to Date: 21 Gy
Reference Point Dosage Given to Date: 21 Gy
Reference Point Session Dosage Given: 3 Gy
Reference Point Session Dosage Given: 3 Gy
Session Number: 7

## 2022-07-07 MED FILL — Fosaprepitant Dimeglumine For IV Infusion 150 MG (Base Eq): INTRAVENOUS | Qty: 5 | Status: AC

## 2022-07-07 MED FILL — Dexamethasone Sodium Phosphate Inj 100 MG/10ML: INTRAMUSCULAR | Qty: 1 | Status: AC

## 2022-07-07 NOTE — Progress Notes (Unsigned)
Palliative Medicine [facility]  Telephone:(336) [phone] Fax:(336) [phone]   Name: Albert Hardy. Date: 07/07/2022 MRN: [id number]  DOB: 1956/01/24  Patient Care Team: [name], MD as PCP - General (Family Medicine)    INTERVAL HISTORY: Albert Strole. is a 67 y.o. 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 Palliative ask to see for symptom management and goals of care   SOCIAL HISTORY:     reports that he quit smoking about 10 years ago. His smoking use included cigarettes. He has a 30.00 pack-year smoking history. He has never used smokeless tobacco. He reports current alcohol use. He reports current drug use. Frequency: 1.00 time per week. Drug: Marijuana.  ADVANCE DIRECTIVES:  None on file  CODE STATUS: Full code  PAST MEDICAL HISTORY: Past Medical History:  Diagnosis Date   Aortic regurgitation    Aortic stenosis 04/04/2018   Atherosclerosis of native arteries of the extremities with intermittent claudication 09/08/2013   Bilateral impacted cerumen 10/13/2018   Bilateral sensorineural hearing loss 12/14/2018   Dyspnea    Former moderate cigarette smoker (10-19 per day) 05/20/2012   Quit smoking Nov 2013 when hospitalized w/ HTN and chest pain(diagnosed w/ valvular heart disease)   GERD (gastroesophageal reflux disease)    Heart murmur    Hyperlipidemia    Hypertension    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  MEDICATIONS:  Current Outpatient Medications  Medication Sig Dispense Refill   albuterol (VENTOLIN HFA) 108 (90 Base) MCG/ACT inhaler Inhale 2 puffs into the lungs every 6 (six) hours as needed for wheezing or shortness of breath. 8 g 6   Ascorbic Acid  (VITAMIN C PO) Take 1 tablet by mouth daily.     Cholecalciferol (VITAMIN D3) 1000 UNITS CAPS Take 1,000 Units by mouth daily.     Coenzyme Q10 (CO Q 10 PO) Take 1 tablet by mouth daily.     Cyanocobalamin (VITAMIN B-12 PO) Take 1 tablet by mouth daily.     cyclobenzaprine (FLEXERIL) 5 MG tablet Take 1 tablet (5 mg total) by mouth 3 (three) times daily as needed for muscle spasms. 30 tablet 0   docusate sodium (COLACE) 100 MG capsule Take 1 capsule (100 mg total) by mouth every 12 (twelve) hours. 60 capsule 0   ezetimibe (ZETIA) 10 MG tablet Take 1 tablet (10 mg total) by mouth daily. 90 tablet 3   fluticasone (FLONASE) 50 MCG/ACT nasal spray Place 1-2 sprays into both nostrils daily. (Patient taking differently: Place 1-2 sprays into both nostrils daily as needed for allergies.) 16 g 6   ibuprofen (ADVIL) 200 MG tablet Take 400 mg by mouth every 6 (six) hours as needed for moderate pain.     KRILL OIL PO Take 1 tablet by mouth daily.     lisinopril-hydrochlorothiazide (ZESTORETIC) 20-12.5 MG tablet Take 2 tablets by mouth once daily 180 tablet 0   MAGNESIUM PO Take 1 tablet by mouth daily.     morphine (MS CONTIN) 15 MG 12 hr tablet Take 1 tablet (15 mg total) by mouth every 12 (twelve) hours. 60 tablet 0   Multiple Vitamins-Minerals (CENTRUM SILVER PO) Take 1 tablet by mouth daily.     Multiple Vitamins-Minerals (  ZINC PO) Take 1 tablet by mouth daily.     Naphazoline-Pheniramine (OPCON-A) 0.027-0.315 % SOLN Place 1 drop into both eyes daily as needed (redness).     nicotine (NICODERM CQ - DOSED IN MG/24 HOURS) 14 mg/24hr patch 14mg /day for 6 weeks then 7mg /day x 2 weeks 28 patch 1   omeprazole (PRILOSEC) 20 MG capsule Take 1 capsule (20 mg total) by mouth daily. 30 capsule 0   oxyCODONE (OXY IR/ROXICODONE) 5 MG immediate release tablet Take 1 tablet (5 mg total) by mouth every 4 (four) hours as needed for severe pain. 90 tablet 0   prochlorperazine (COMPAZINE) 10 MG tablet Take 1 tablet (10 mg  total) by mouth every 6 (six) hours as needed for nausea or vomiting. 30 tablet 0   rosuvastatin (CRESTOR) 5 MG tablet Take 1 tablet (5 mg total) by mouth at bedtime. 30 tablet 0   TURMERIC PO Take 1 tablet by mouth 3 (three) times a week.     umeclidinium-vilanterol (ANORO ELLIPTA) 62.5-25 MCG/ACT AEPB Inhale 1 puff into the lungs daily. (Patient taking differently: Inhale 1 puff into the lungs daily as needed (shortness of breath).) 180 each 3   No current facility-administered medications for this visit.    VITAL SIGNS: There were no vitals taken for this visit. There were no vitals filed for this visit.  Estimated body mass index is 23.08 kg/m as calculated from the following:   Height as of 06/25/22: 5\' 9"  (1.753 m).   Weight as of 06/25/22: 156 lb 4.8 oz (70.9 kg).   PERFORMANCE STATUS (ECOG) : 1 - Symptomatic but completely ambulatory   Physical Exam General: NAD, resting in recliner  Cardiovascular: regular rate and rhythm Pulmonary: normal breathing pattern  Extremities: no edema, no joint deformities Skin: no rashes Neurological: AAO x4  IMPRESSION: I saw Albert Hardy during his infusion. No acute distress. Resting comfortable. States he is doing well overall. Is remaining as active as possible. Tolerating treatments without difficulty. Denies nausea, vomiting, constipation, or diarrhea.   Neoplasm related pain Albert Hardy reports his pain is well-controlled on current regimen.  Some days are better than others.     We discussed his regimen at length. He is currently taking MS Contin 15 mg every 12 hours, Oxy IR 5mg  every 4-6  hours as needed for breakthrough pain.  Flexeril as needed for spasms.  He is tolerating MS Contin without difficulty.   No changes to current regimen at this time.  Will continue to closely monitor.   Constipation Controlled with daily regimen.  Decreased appetite/Weight loss  Appetite is slowly improving.  Some days are better than others.  Weight  is stable at 156 pounds.  Goals of Care   06/18/22-  We discussed his current illness and what it means in the larger context of hsi on-going co-morbidities. Natural disease trajectory and expectations were discussed.  Albert Hardy and his wife are realistic in their expectations and understanding of his incurable cancer. He knows all treatment is palliative focused. Wishes to continue taking things one day at a time focusing on his quality of life allowing him every opportunity to continue to thrive.   We discussed Her current illness and what it means in the larger context of Her on-going co-morbidities. Natural disease trajectory and expectations were discussed.  I discussed the importance of continued conversation with family and their medical providers regarding overall plan of care and treatment options, ensuring decisions are within the context of the patients values  and GOCs.  PLAN:  Pepcid daily Oxy IR 5mg  every 4-6 hours as needed for breakthrough pain  MS Contin 15 mg every 12 hours.  Tolerating well. Flexeril as needed Pain contract on file I will plan to see patient back in 3-4 weeks in collaboration to other oncology appointments.    Patient expressed understanding and was in agreement with this plan. He also understands that He can call the clinic at any time with any questions, concerns, or complaints.   Any controlled substances utilized were prescribed in the context of palliative care. PDMP has been reviewed.    Visit consisted of counseling and education dealing with the complex and emotionally intense issues of symptom management and palliative care in the setting of serious and potentially life-threatening illness.Greater than 50%  of this time was spent counseling and coordinating care related to the above assessment and plan.  Willette Alma, AGPCNP-BC  Palliative Medicine Team/[facility]  *Please note that this is a verbal dictation  therefore any spelling or grammatical errors are due to the "Dragon Medical One" system interpretation.

## 2022-07-08 ENCOUNTER — Inpatient Hospital Stay: Payer: Medicare Other

## 2022-07-08 ENCOUNTER — Ambulatory Visit
Admission: RE | Admit: 2022-07-08 | Discharge: 2022-07-08 | Disposition: A | Discharge status: Home / Self Care | Payer: Medicare Other | Source: Ambulatory Visit | Attending: Radiation Oncology | Admitting: Radiation Oncology

## 2022-07-08 ENCOUNTER — Inpatient Hospital Stay (HOSPITAL_BASED_OUTPATIENT_CLINIC_OR_DEPARTMENT_OTHER): Payer: Medicare Other | Admitting: Internal Medicine

## 2022-07-08 ENCOUNTER — Encounter: Payer: Self-pay | Admitting: Nurse Practitioner

## 2022-07-08 ENCOUNTER — Other Ambulatory Visit: Payer: Self-pay

## 2022-07-08 ENCOUNTER — Inpatient Hospital Stay (HOSPITAL_BASED_OUTPATIENT_CLINIC_OR_DEPARTMENT_OTHER): Payer: Medicare Other | Admitting: Nurse Practitioner

## 2022-07-08 ENCOUNTER — Encounter: Payer: Self-pay | Admitting: Medical Oncology

## 2022-07-08 VITALS — BP 110/58 | HR 63 | Temp 98.1°F | Resp 16

## 2022-07-08 DIAGNOSIS — C3492 Malignant neoplasm of unspecified part of left bronchus or lung: Secondary | ICD-10-CM

## 2022-07-08 DIAGNOSIS — Z515 Encounter for palliative care: Secondary | ICD-10-CM

## 2022-07-08 DIAGNOSIS — Z5111 Encounter for antineoplastic chemotherapy: Secondary | ICD-10-CM | POA: Diagnosis not present

## 2022-07-08 DIAGNOSIS — G893 Neoplasm related pain (acute) (chronic): Secondary | ICD-10-CM | POA: Diagnosis not present

## 2022-07-08 DIAGNOSIS — R530 Neoplastic (malignant) related fatigue: Secondary | ICD-10-CM | POA: Diagnosis not present

## 2022-07-08 LAB — RAD ONC ARIA SESSION SUMMARY
Course Elapsed Days: 13
Plan Fractions Treated to Date: 8
Plan Fractions Treated to Date: 8
Plan Prescribed Dose Per Fraction: 3 Gy
Plan Prescribed Dose Per Fraction: 3 Gy
Plan Total Fractions Prescribed: 10
Plan Total Fractions Prescribed: 10
Plan Total Prescribed Dose: 30 Gy
Plan Total Prescribed Dose: 30 Gy
Reference Point Dosage Given to Date: 24 Gy
Reference Point Dosage Given to Date: 24 Gy
Reference Point Session Dosage Given: 3 Gy
Reference Point Session Dosage Given: 3 Gy
Session Number: 8

## 2022-07-08 LAB — CMP (CANCER CENTER ONLY)
ALT: 18 U/L (ref 0–44)
AST: 15 U/L (ref 15–41)
Albumin: 4 g/dL (ref 3.5–5.0)
Alkaline Phosphatase: 84 U/L (ref 38–126)
Anion gap: 6 (ref 5–15)
BUN: 27 mg/dL — ABNORMAL HIGH (ref 8–23)
CO2: 28 mmol/L (ref 22–32)
Calcium: 9.7 mg/dL (ref 8.9–10.3)
Chloride: 102 mmol/L (ref 98–111)
Creatinine: 1.17 mg/dL (ref 0.61–1.24)
GFR, Estimated: >60 mL/min (ref >60)
Glucose, Bld: 128 mg/dL — ABNORMAL HIGH (ref 70–99)
Potassium: 4.3 mmol/L (ref 3.5–5.1)
Sodium: 136 mmol/L (ref 135–145)
Total Bilirubin: 0.2 mg/dL — ABNORMAL LOW (ref 0.3–1.2)
Total Protein: 7.8 g/dL (ref 6.5–8.1)

## 2022-07-08 LAB — CBC WITH DIFFERENTIAL (CANCER CENTER ONLY)
Abs Immature Granulocytes: 0.01 10*3/uL (ref 0.00–0.07)
Basophils Absolute: 0 10*3/uL (ref 0.0–0.1)
Basophils Relative: 0 %
Eosinophils Absolute: 0 10*3/uL (ref 0.0–0.5)
Eosinophils Relative: 0 %
HCT: 34.4 % — ABNORMAL LOW (ref 39.0–52.0)
Hemoglobin: 11.9 g/dL — ABNORMAL LOW (ref 13.0–17.0)
Immature Granulocytes: 0 %
Lymphocytes Relative: 15 %
Lymphs Abs: 0.8 10*3/uL (ref 0.7–4.0)
MCH: 29.6 pg (ref 26.0–34.0)
MCHC: 34.6 g/dL (ref 30.0–36.0)
MCV: 85.6 fL (ref 80.0–100.0)
Monocytes Absolute: 0.7 10*3/uL (ref 0.1–1.0)
Monocytes Relative: 13 %
Neutro Abs: 3.7 10*3/uL (ref 1.7–7.7)
Neutrophils Relative %: 72 %
Platelet Count: 235 10*3/uL (ref 150–400)
RBC: 4.02 MIL/uL — ABNORMAL LOW (ref 4.22–5.81)
RDW: 13.1 % (ref 11.5–15.5)
WBC Count: 5.2 10*3/uL (ref 4.0–10.5)
nRBC: 0 % (ref 0.0–0.2)

## 2022-07-08 MED ORDER — SODIUM CHLORIDE 0.9 % IV SOLN
350.0000 mg | Freq: Once | INTRAVENOUS | Status: AC
  Administered 2022-07-08: 350 mg via INTRAVENOUS
  Filled 2022-07-08: qty 7

## 2022-07-08 MED ORDER — FAMOTIDINE IN NACL 20-0.9 MG/50ML-% IV SOLN
20.0000 mg | Freq: Once | INTRAVENOUS | Status: AC
  Administered 2022-07-08: 20 mg via INTRAVENOUS
  Filled 2022-07-08: qty 50

## 2022-07-08 MED ORDER — SODIUM CHLORIDE 0.9 % IV SOLN
175.0000 mg/m2 | Freq: Once | INTRAVENOUS | Status: AC
  Administered 2022-07-08: 330 mg via INTRAVENOUS
  Filled 2022-07-08: qty 55

## 2022-07-08 MED ORDER — SODIUM CHLORIDE 0.9 % IV SOLN
389.0000 mg | Freq: Once | INTRAVENOUS | Status: AC
  Administered 2022-07-08: 390 mg via INTRAVENOUS
  Filled 2022-07-08: qty 39

## 2022-07-08 MED ORDER — PALONOSETRON HCL INJECTION 0.25 MG/5ML
0.2500 mg | Freq: Once | INTRAVENOUS | Status: AC
  Administered 2022-07-08: 0.25 mg via INTRAVENOUS
  Filled 2022-07-08: qty 5

## 2022-07-08 MED ORDER — DIPHENHYDRAMINE HCL 50 MG/ML IJ SOLN
50.0000 mg | Freq: Once | INTRAMUSCULAR | Status: AC
  Administered 2022-07-08: 50 mg via INTRAVENOUS
  Filled 2022-07-08: qty 1

## 2022-07-08 MED ORDER — SODIUM CHLORIDE 0.9 % IV SOLN: Freq: Once | INTRAVENOUS | Status: AC

## 2022-07-08 MED ORDER — SODIUM CHLORIDE 0.9 % IV SOLN
10.0000 mg | Freq: Once | INTRAVENOUS | Status: AC
  Administered 2022-07-08: 10 mg via INTRAVENOUS
  Filled 2022-07-08: qty 10

## 2022-07-08 MED ORDER — SODIUM CHLORIDE 0.9 % IV SOLN
150.0000 mg | Freq: Once | INTRAVENOUS | Status: AC
  Administered 2022-07-08: 150 mg via INTRAVENOUS
  Filled 2022-07-08: qty 150

## 2022-07-08 NOTE — Progress Notes (Signed)
Patient seen by Dr. Gypsy Balsam are within treatment parameters.  Labs reviewed: and are within treatment parameters.  Per physician team, patient is ready for treatment and there are NO modifications to the treatment plan.  [id number] ,Paclitaxel , Carboplatin

## 2022-07-08 NOTE — Patient Instructions (Signed)
[facility]  Discharge Instructions: Thank you for choosing [facility] to provide your oncology and hematology care.   If you have a lab appointment with the Cancer Center, please go directly to the Cancer Center and check in at the registration area.   Wear comfortable clothing and clothing appropriate for easy access to any Portacath or PICC line.   We strive to give you quality time with your provider. You may need to reschedule your appointment if you arrive late (15 or more minutes).  Arriving late affects you and other patients whose appointments are after yours.  Also, if you miss three or more appointments without notifying the office, you may be dismissed from the clinic at the provider's discretion.      For prescription refill requests, have your pharmacy contact our office and allow 72 hours for refills to be completed.    Today you received the following chemotherapy and/or immunotherapy agents: Libtayo, Paxlitaxel, Carboplatin  To help prevent nausea and vomiting after your treatment, we encourage you to take your nausea medication as directed.  BELOW ARE SYMPTOMS THAT SHOULD BE REPORTED IMMEDIATELY: *FEVER GREATER THAN 100.4 F (38 C) OR HIGHER *CHILLS OR SWEATING *NAUSEA AND VOMITING THAT IS NOT CONTROLLED WITH YOUR NAUSEA MEDICATION *UNUSUAL SHORTNESS OF BREATH *UNUSUAL BRUISING OR BLEEDING *URINARY PROBLEMS (pain or burning when urinating, or frequent urination) *BOWEL PROBLEMS (unusual diarrhea, constipation, pain near the anus) TENDERNESS IN MOUTH AND THROAT WITH OR WITHOUT PRESENCE OF ULCERS (sore throat, sores in mouth, or a toothache) UNUSUAL RASH, SWELLING OR PAIN  UNUSUAL VAGINAL DISCHARGE OR ITCHING   Items with * indicate a potential emergency and should be followed up as soon as possible or go to the Emergency Department if any problems should occur.  Please show the CHEMOTHERAPY ALERT CARD or IMMUNOTHERAPY  ALERT CARD at check-in to the Emergency Department and triage nurse.  Should you have questions after your visit or need to cancel or reschedule your appointment, please contact  [facility]  Dept: [phone]  and follow the prompts.  Office hours are 8:00 a.m. to 4:30 p.m. Monday - Friday. Please note that voicemails left after 4:00 p.m. may not be returned until the following business day.  We are closed weekends and major holidays. You have access to a nurse at all times for urgent questions. Please call the main number to the clinic Dept: [phone] and follow the prompts.   For any non-urgent questions, you may also contact your provider using MyChart. We now offer e-Visits for anyone 78 and older to request care online for non-urgent symptoms. For details visit [email or web address].   Also download the MyChart app! Go to the app store, search "MyChart", open the app, select , and log in with your MyChart username and password.  Cemiplimab Injection What is this medication? CEMIPLIMAB (se MIP li mab) treats skin cancer and lung cancer. It works by helping your immune system slow or stop the spread of cancer cells. It is a monoclonal antibody. This medicine may be used for other purposes; ask your health care provider or pharmacist if you have questions. COMMON BRAND NAME(S): LIBTAYO What should I tell my care team before I take this medication? They need to know if you have any of these conditions: Allogeneic stem cell transplant (uses someone else's stem cells) Autoimmune diseases, such as Crohn's disease, ulcerative colitis, or lupus Diabetes Nervous system problems, such as  myasthenia gravis or Guillain-Barre syndrome Organ transplant Recent or ongoing radiation Thyroid disease An unusual or allergic reaction to cemiplimab, other medications, foods, dyes, or preservatives Pregnant or trying to get pregnant Breast-feeding How  should I use this medication? This medication is infused into a vein. It is given by your care team in a hospital or clinic setting. A special MedGuide will be given to you before each treatment. Be sure to read this information carefully each time. Talk to your care team about the use of this medication in children. Special care may be needed. Overdosage: If you think you have taken too much of this medicine contact a poison control center or emergency room at once. NOTE: This medicine is only for you. Do not share this medicine with others. What if I miss a dose? Keep appointments for follow-up doses. It is important not to miss your dose. Call your care team if you are unable to keep an appointment. What may interact with this medication? Interactions have not been studied. This list may not describe all possible interactions. Give your health care provider a list of all the medicines, herbs, non-prescription drugs, or dietary supplements you use. Also tell them if you smoke, drink alcohol, or use illegal drugs. Some items may interact with your medicine. What should I watch for while using this medication? This medication may make you feel generally unwell. This is not uncommon as chemotherapy can affect healthy cells as well as cancer cells. Report any side effects. Continue your course of treatment even though you feel ill until your care team tells you to stop. You may need blood work done while you are taking this medication. This medication may cause serious skin reactions. They can happen in the weeks to months after starting the medication. Contact your care team right away if you notice fevers or flu-like symptoms with a rash. The rash may be red or purple and then turn into blisters or peeling of the skin. You may also notice a red rash with swelling of the face, lips, or lymph nodes in your neck or under your arms. Tell your care team right away if you have any changes in your  vision. This medication may increase blood sugar. The risk may be higher in patients who already have diabetes. Ask your care team what you can do to lower your risk of diabetes while taking this medication. Talk to your care team if you wish to become pregnant or think you might be pregnant. This medication can cause serious birth defects if taken during pregnancy. A negative pregnancy test is required before starting this medication. A reliable form of contraception is recommended while taking this medication and for at least 4 months after stopping it. Do not breast-feed while taking this medication and for at least 4 months after stopping it. What side effects may I notice from receiving this medication? Side effects that you should report to your care team as soon as possible: Allergic reactions--skin rash, itching, hives, swelling of the face, lips, tongue, or throat Dry cough, shortness of breath or trouble breathing Eye pain, redness, irritation, or discharge with blurry or decreased vision Heart muscle inflammation--unusual weakness or fatigue, shortness of breath, chest pain, fast or irregular heartbeat, dizziness, swelling of the ankles, feet, or hands Hormone gland problems--headache, sensitivity to light, unusual weakness or fatigue, dizziness, fast or irregular heartbeat, increased sensitivity to cold or heat, excessive sweating, constipation, hair loss, increased thirst or amount of urine, tremors or  shaking, irritability Infusion reactions--chest pain, shortness of breath or trouble breathing, feeling faint or lightheaded Kidney injury (glomerulonephritis)--decrease in the amount of urine, red or dark brown urine, foamy or bubbly urine, swelling of the ankles, hands, or feet Liver injury--right upper belly pain, loss of appetite, nausea, light-colored stool, dark yellow or brown urine, yellowing skin or eyes, unusual weakness or fatigue Pain, tingling, or numbness in the hands or feet,  muscle weakness, change in vision, confusion or trouble speaking, loss of balance or coordination, trouble walking, seizures Rash, fever, and swollen lymph nodes Redness, blistering, peeling, or loosening of the skin, including inside the mouth Sudden or severe stomach pain, bloody diarrhea, fever, nausea, vomiting Side effects that usually do not require medical attention (report these to your care team if they continue or are bothersome): Bone, joint, or muscle pain Diarrhea Fatigue Loss of appetite Nausea Skin rash This list may not describe all possible side effects. Call your doctor for medical advice about side effects. You may report side effects to FDA at 1-800-FDA-1088. Where should I keep my medication? This medication is given in a hospital or clinic and will not be stored at home. NOTE: This sheet is a summary. It may not cover all possible information. If you have questions about this medicine, talk to your doctor, pharmacist, or health care provider.  2023 Elsevier/Gold Standard (2020-12-27 00:00:00)  Paclitaxel Injection What is this medication? PACLITAXEL (PAK li TAX el) treats some types of cancer. It works by slowing down the growth of cancer cells. This medicine may be used for other purposes; ask your health care provider or pharmacist if you have questions. COMMON BRAND NAME(S): Onxol, Taxol What should I tell my care team before I take this medication? They need to know if you have any of these conditions: Heart disease Liver disease Low white blood cell levels An unusual or allergic reaction to paclitaxel, other medications, foods, dyes, or preservatives If you or your partner are pregnant or trying to get pregnant Breast-feeding How should I use this medication? This medication is injected into a vein. It is given by your care team in a hospital or clinic setting. Talk to your care team about the use of this medication in children. While it may be given to  children for selected conditions, precautions do apply. Overdosage: If you think you have taken too much of this medicine contact a poison control center or emergency room at once. NOTE: This medicine is only for you. Do not share this medicine with others. What if I miss a dose? Keep appointments for follow-up doses. It is important not to miss your dose. Call your care team if you are unable to keep an appointment. What may interact with this medication? Do not take this medication with any of the following: Live virus vaccines Other medications may affect the way this medication works. Talk with your care team about all of the medications you take. They may suggest changes to your treatment plan to lower the risk of side effects and to make sure your medications work as intended. This list may not describe all possible interactions. Give your health care provider a list of all the medicines, herbs, non-prescription drugs, or dietary supplements you use. Also tell them if you smoke, drink alcohol, or use illegal drugs. Some items may interact with your medicine. What should I watch for while using this medication? Your condition will be monitored carefully while you are receiving this medication. You may need blood  work while taking this medication. This medication may make you feel generally unwell. This is not uncommon as chemotherapy can affect healthy cells as well as cancer cells. Report any side effects. Continue your course of treatment even though you feel ill unless your care team tells you to stop. This medication can cause serious allergic reactions. To reduce the risk, your care team may give you other medications to take before receiving this one. Be sure to follow the directions from your care team. This medication may increase your risk of getting an infection. Call your care team for advice if you get a fever, chills, sore throat, or other symptoms of a cold or flu. Do not treat  yourself. Try to avoid being around people who are sick. This medication may increase your risk to bruise or bleed. Call your care team if you notice any unusual bleeding. Be careful brushing or flossing your teeth or using a toothpick because you may get an infection or bleed more easily. If you have any dental work done, tell your dentist you are receiving this medication. Talk to your care team if you may be pregnant. Serious birth defects can occur if you take this medication during pregnancy. Talk to your care team before breastfeeding. Changes to your treatment plan may be needed. What side effects may I notice from receiving this medication? Side effects that you should report to your care team as soon as possible: Allergic reactions--skin rash, itching, hives, swelling of the face, lips, tongue, or throat Heart rhythm changes--fast or irregular heartbeat, dizziness, feeling faint or lightheaded, chest pain, trouble breathing Increase in blood pressure Infection--fever, chills, cough, sore throat, wounds that don't heal, pain or trouble when passing urine, general feeling of discomfort or being unwell Low blood pressure--dizziness, feeling faint or lightheaded, blurry vision Low red blood cell level--unusual weakness or fatigue, dizziness, headache, trouble breathing Painful swelling, warmth, or redness of the skin, blisters or sores at the infusion site Pain, tingling, or numbness in the hands or feet Slow heartbeat--dizziness, feeling faint or lightheaded, confusion, trouble breathing, unusual weakness or fatigue Unusual bruising or bleeding Side effects that usually do not require medical attention (report to your care team if they continue or are bothersome): Diarrhea Hair loss Joint pain Loss of appetite Muscle pain Nausea Vomiting This list may not describe all possible side effects. Call your doctor for medical advice about side effects. You may report side effects to FDA at  1-800-FDA-1088. Where should I keep my medication? This medication is given in a hospital or clinic. It will not be stored at home. NOTE: This sheet is a summary. It may not cover all possible information. If you have questions about this medicine, talk to your doctor, pharmacist, or health care provider.  2023 Elsevier/Gold Standard (2021-06-12 00:00:00)  Carboplatin Injection What is this medication? CARBOPLATIN (KAR boe pla tin) treats some types of cancer. It works by slowing down the growth of cancer cells. This medicine may be used for other purposes; ask your health care provider or pharmacist if you have questions. COMMON BRAND NAME(S): Paraplatin What should I tell my care team before I take this medication? They need to know if you have any of these conditions: Blood disorders Hearing problems Kidney disease Recent or ongoing radiation therapy An unusual or allergic reaction to carboplatin, cisplatin, other medications, foods, dyes, or preservatives Pregnant or trying to get pregnant Breast-feeding How should I use this medication? This medication is injected into a vein. It  is given by your care team in a hospital or clinic setting. Talk to your care team about the use of this medication in children. Special care may be needed. Overdosage: If you think you have taken too much of this medicine contact a poison control center or emergency room at once. NOTE: This medicine is only for you. Do not share this medicine with others. What if I miss a dose? Keep appointments for follow-up doses. It is important not to miss your dose. Call your care team if you are unable to keep an appointment. What may interact with this medication? Medications for seizures Some antibiotics, such as amikacin, gentamicin, neomycin, streptomycin, tobramycin Vaccines This list may not describe all possible interactions. Give your health care provider a list of all the medicines, herbs,  non-prescription drugs, or dietary supplements you use. Also tell them if you smoke, drink alcohol, or use illegal drugs. Some items may interact with your medicine. What should I watch for while using this medication? Your condition will be monitored carefully while you are receiving this medication. You may need blood work while taking this medication. This medication may make you feel generally unwell. This is not uncommon, as chemotherapy can affect healthy cells as well as cancer cells. Report any side effects. Continue your course of treatment even though you feel ill unless your care team tells you to stop. In some cases, you may be given additional medications to help with side effects. Follow all directions for their use. This medication may increase your risk of getting an infection. Call your care team for advice if you get a fever, chills, sore throat, or other symptoms of a cold or flu. Do not treat yourself. Try to avoid being around people who are sick. Avoid taking medications that contain aspirin, acetaminophen, ibuprofen, naproxen, or ketoprofen unless instructed by your care team. These medications may hide a fever. Be careful brushing or flossing your teeth or using a toothpick because you may get an infection or bleed more easily. If you have any dental work done, tell your dentist you are receiving this medication. Talk to your care team if you wish to become pregnant or think you might be pregnant. This medication can cause serious birth defects. Talk to your care team about effective forms of contraception. Do not breast-feed while taking this medication. What side effects may I notice from receiving this medication? Side effects that you should report to your care team as soon as possible: Allergic reactions--skin rash, itching, hives, swelling of the face, lips, tongue, or throat Infection--fever, chills, cough, sore throat, wounds that don't heal, pain or trouble when passing  urine, general feeling of discomfort or being unwell Low red blood cell level--unusual weakness or fatigue, dizziness, headache, trouble breathing Pain, tingling, or numbness in the hands or feet, muscle weakness, change in vision, confusion or trouble speaking, loss of balance or coordination, trouble walking, seizures Unusual bruising or bleeding Side effects that usually do not require medical attention (report to your care team if they continue or are bothersome): Hair loss Nausea Unusual weakness or fatigue Vomiting This list may not describe all possible side effects. Call your doctor for medical advice about side effects. You may report side effects to FDA at 1-800-FDA-1088. Where should I keep my medication? This medication is given in a hospital or clinic. It will not be stored at home. NOTE: This sheet is a summary. It may not cover all possible information. If you have questions about  this medicine, talk to your doctor, pharmacist, or health care provider.  2023 Elsevier/Gold Standard (2007-03-19 00:00:00)

## 2022-07-08 NOTE — Progress Notes (Signed)
[facility] [phone]   Fax:(336) [phone]  OFFICE PROGRESS NOTE  [name], MD [address] [place] [address]  DIAGNOSIS: Stage IV (T2a, N2, M1 B) non-small cell lung cancer, squamous cell carcinoma presented with left upper lobe lung mass in addition to AP window lymphadenopathy and suspicious bone metastasis to the T8 and L4 vertebrae in addition to retroperitoneal metastatic soft tissue nodule diagnosed in April 2024.   Molecular studies by [id number] showed no actionable mutations and PD-L1 expression of 70%.  PRIOR THERAPY: None.  CURRENT THERAPY: Systemic chemotherapy with carboplatin for AUC of 5, paclitaxel 175 Mg/M2 and Libtayo (Cempilimab) 350 Mg IV every 3 weeks with Neulasta support for 4 cycles followed by maintenance treatment with Libtayo (Cempilimab) 350 Mg IV every 3 weeks.  He is status post 1 cycle.  INTERVAL HISTORY: Albert Hardy. 67 y.o. male returns to the clinic today for follow-up visit accompanied by his wife.  The patient is feeling fine today with no concerning complaints.  The back pain is much improved with the radiation therapy and also the pain management.  He denied having any chest pain, shortness of breath, cough or hemoptysis.  He has no nausea, vomiting, diarrhea or constipation.  He has no headache or visual changes.  He denied having any recent weight loss or night sweats.  He is here today for evaluation before starting cycle #2 of his treatment.  MEDICAL HISTORY: Past Medical History:  Diagnosis Date   Aortic regurgitation    Aortic stenosis 04/04/2018   Atherosclerosis of native arteries of the extremities with intermittent claudication 09/08/2013   Bilateral impacted cerumen 10/13/2018   Bilateral sensorineural hearing loss 12/14/2018   Dyspnea    Former moderate cigarette smoker (10-19 per day) 05/20/2012   Quit smoking Nov 2013 when hospitalized w/ HTN and chest pain(diagnosed w/  valvular heart disease)   GERD (gastroesophageal reflux disease)    Heart murmur    Hyperlipidemia    Hypertension    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  MEDICATIONS:  Current Outpatient Medications  Medication Sig Dispense Refill   albuterol (VENTOLIN HFA) 108 (90 Base) MCG/ACT inhaler Inhale 2 puffs into the lungs every 6 (six) hours as needed for wheezing or shortness of breath. 8 g 6   Ascorbic Acid (VITAMIN C PO) Take 1 tablet by mouth daily.     Cholecalciferol (VITAMIN D3) 1000 UNITS CAPS Take 1,000 Units by mouth daily.     Coenzyme Q10 (CO Q 10 PO) Take 1 tablet by mouth daily.     Cyanocobalamin (VITAMIN B-12 PO) Take 1 tablet by mouth daily.     cyclobenzaprine (FLEXERIL) 5 MG tablet Take 1 tablet (5 mg total) by mouth 3 (three) times daily as needed for muscle spasms. 30 tablet 0   docusate sodium (COLACE) 100 MG capsule Take 1 capsule (100 mg total) by mouth every 12 (twelve) hours. 60 capsule 0   ezetimibe (ZETIA) 10 MG tablet Take 1 tablet (10 mg total) by mouth daily. 90 tablet 3   fluticasone (FLONASE) 50 MCG/ACT nasal spray Place 1-2 sprays into both nostrils daily. (Patient taking differently: Place 1-2 sprays into both nostrils daily as needed for allergies.) 16 g 6  ibuprofen (ADVIL) 200 MG tablet Take 400 mg by mouth every 6 (six) hours as needed for moderate pain.     KRILL OIL PO Take 1 tablet by mouth daily.     lisinopril-hydrochlorothiazide (ZESTORETIC) 20-12.5 MG tablet Take 2 tablets by mouth once daily 180 tablet 0   MAGNESIUM PO Take 1 tablet by mouth daily.     morphine (MS CONTIN) 15 MG 12 hr tablet Take 1 tablet (15 mg total) by mouth every 12 (twelve) hours. 60 tablet 0   Multiple Vitamins-Minerals (CENTRUM SILVER PO) Take 1 tablet by mouth daily.      Multiple Vitamins-Minerals (ZINC PO) Take 1 tablet by mouth daily.     Naphazoline-Pheniramine (OPCON-A) 0.027-0.315 % SOLN Place 1 drop into both eyes daily as needed (redness).     nicotine (NICODERM CQ - DOSED IN MG/24 HOURS) 14 mg/24hr patch 14mg /day for 6 weeks then 7mg /day x 2 weeks 28 patch 1   omeprazole (PRILOSEC) 20 MG capsule Take 1 capsule (20 mg total) by mouth daily. 30 capsule 0   oxyCODONE (OXY IR/ROXICODONE) 5 MG immediate release tablet Take 1 tablet (5 mg total) by mouth every 4 (four) hours as needed for severe pain. 90 tablet 0   prochlorperazine (COMPAZINE) 10 MG tablet Take 1 tablet (10 mg total) by mouth every 6 (six) hours as needed for nausea or vomiting. 30 tablet 0   rosuvastatin (CRESTOR) 5 MG tablet Take 1 tablet (5 mg total) by mouth at bedtime. 30 tablet 0   TURMERIC PO Take 1 tablet by mouth 3 (three) times a week.     umeclidinium-vilanterol (ANORO ELLIPTA) 62.5-25 MCG/ACT AEPB Inhale 1 puff into the lungs daily. (Patient taking differently: Inhale 1 puff into the lungs daily as needed (shortness of breath).) 180 each 3   No current facility-administered medications for this visit.    SURGICAL HISTORY:  Past Surgical History:  Procedure Laterality Date   BRONCHIAL BIOPSY  05/26/2022   Procedure: BRONCHIAL BIOPSIES;  Surgeon: Josephine Igo, DO;  Location: [facility];  Service: Pulmonary;;   BRONCHIAL BRUSHINGS  05/26/2022   Procedure: BRONCHIAL BRUSHINGS;  Surgeon: Josephine Igo, DO;  Location: [facility];  Service: Pulmonary;;   NO PAST SURGERIES     VIDEO BRONCHOSCOPY  05/26/2022   Procedure: VIDEO BRONCHOSCOPY WITHOUT FLUORO;  Surgeon: Josephine Igo, DO;  Location: [facility];  Service: Pulmonary;;    REVIEW OF SYSTEMS:  A comprehensive review of systems was negative except for: Musculoskeletal: positive for back pain   PHYSICAL EXAMINATION: General appearance: alert, cooperative, and no distress Head: Normocephalic, without obvious  abnormality, atraumatic Neck: no adenopathy, no JVD, supple, symmetrical, trachea midline, and thyroid not enlarged, symmetric, no tenderness/mass/nodules Lymph nodes: Cervical, supraclavicular, and axillary nodes normal. Resp: clear to auscultation bilaterally Back: symmetric, no curvature. ROM normal. No CVA tenderness. Cardio: regular rate and rhythm, S1, S2 normal, no murmur, click, rub or gallop GI: soft, non-tender; bowel sounds normal; no masses,  no organomegaly Extremities: extremities normal, atraumatic, no cyanosis or edema  ECOG PERFORMANCE STATUS: 1 - Symptomatic but completely ambulatory  Blood pressure 125/69, pulse 63, temperature 98.3 F (36.8 C), temperature source Oral, resp. rate 17, weight 155 lb 4.8 oz (70.4 kg), SpO2 100 %.  LABORATORY DATA: Lab Results  Component Value Date   WBC 5.2 07/08/2022   HGB 11.9 (L) 07/08/2022   HCT 34.4 (L) 07/08/2022   MCV 85.6 07/08/2022   PLT 235 07/08/2022      Chemistry  Component Value Date/Time   NA 136 07/08/2022 0925   NA 145 (H) 10/21/2021 1038   K 4.3 07/08/2022 0925   CL 102 07/08/2022 0925   CO2 28 07/08/2022 0925   BUN 27 (H) 07/08/2022 0925   BUN 19 10/21/2021 1038   CREATININE 1.17 07/08/2022 0925   CREATININE 1.07 08/27/2014 1511      Component Value Date/Time   CALCIUM 9.7 07/08/2022 0925   ALKPHOS 84 07/08/2022 0925   AST 15 07/08/2022 0925   ALT 18 07/08/2022 0925   BILITOT 0.2 (L) 07/08/2022 0925       RADIOGRAPHIC STUDIES: MR BRAIN W WO CONTRAST  Result Date: 06/09/2022 CLINICAL DATA:  Non-small cell lung cancer (NSCLC), staging. EXAM: MRI HEAD WITHOUT AND WITH CONTRAST TECHNIQUE: Multiplanar, multiecho pulse sequences of the brain and surrounding structures were obtained without and with intravenous contrast. CONTRAST:  7.13mL GADAVIST GADOBUTROL 1 MMOL/ML IV SOLN COMPARISON:  None Available. FINDINGS: Brain: No acute infarct or hemorrhage. Mild chronic small-vessel disease. No  hydrocephalus or extra-axial collection. No foci of abnormal susceptibility. No mass or abnormal enhancement. Vascular: Normal flow voids and enhancement. Skull and upper cervical spine: Normal marrow signal and enhancement. Sinuses/Orbits: Unremarkable. Other: None. IMPRESSION: No evidence of metastatic disease to the brain. Electronically Signed   By: Orvan Falconer M.D.   On: 06/09/2022 15:40    ASSESSMENT AND PLAN: This is a very pleasant 67 years old African-American male with Stage IV (T2a, N2, M1b) non-small cell lung cancer, squamous cell carcinoma presented with left upper lobe lung mass in addition to AP window lymphadenopathy and suspicious bone metastasis to the T8 and L4 vertebrae as well as right retroperitoneal metastatic nodule diagnosed in April 2024.  Molecular studies by [id number] showed no actionable mutations and PD-L1 expression of 70%. The patient is currently undergoing palliative combination of systemic chemotherapy with carboplatin for AUC of 5, paclitaxel 175 Mg/M2 and immunotherapy with Libtayo (Cempilimab) 350 Mg IV every 3 weeks with Neulasta support for 4 cycles followed by maintenance treatment with Libtayo (Cempilimab) of the patient has no disease progression after cycle #4.  He is status post 1 cycle. The patient tolerated the first cycle of his treatment well with no concerning adverse effects. I recommended for him to proceed with cycle #2 today as planned. For the back pain, he is currently undergoing palliative radiotherapy to the T8 and L4 vertebral lesions and his pain medication is managed by the palliative care team. For the bone metastasis, he will continue his treatment with Xgeva 120 mg subcutaneously every 6 weeks. The patient was advised to call immediately if he has any other concerning symptoms in the interval. The patient voices understanding of current disease status and treatment options and is in agreement with the current care plan.  All questions  were answered. The patient knows to call the clinic with any problems, questions or concerns. We can certainly see the patient much sooner if necessary.  The total time spent in the appointment was 20 minutes.  Disclaimer: This note was dictated with voice recognition software. Similar sounding words can inadvertently be transcribed and may not be corrected upon review.

## 2022-07-09 ENCOUNTER — Other Ambulatory Visit: Payer: Self-pay

## 2022-07-09 ENCOUNTER — Ambulatory Visit: Payer: Medicare Other

## 2022-07-09 ENCOUNTER — Ambulatory Visit
Admission: RE | Admit: 2022-07-09 | Discharge: 2022-07-09 | Disposition: A | Discharge status: Home / Self Care | Payer: Medicare Other | Source: Ambulatory Visit | Attending: Radiation Oncology | Admitting: Radiation Oncology

## 2022-07-09 ENCOUNTER — Inpatient Hospital Stay: Payer: Medicare Other | Admitting: Nurse Practitioner

## 2022-07-09 DIAGNOSIS — Z5111 Encounter for antineoplastic chemotherapy: Secondary | ICD-10-CM | POA: Diagnosis not present

## 2022-07-09 LAB — RAD ONC ARIA SESSION SUMMARY
Course Elapsed Days: 14
Plan Fractions Treated to Date: 9
Plan Fractions Treated to Date: 9
Plan Prescribed Dose Per Fraction: 3 Gy
Plan Prescribed Dose Per Fraction: 3 Gy
Plan Total Fractions Prescribed: 10
Plan Total Fractions Prescribed: 10
Plan Total Prescribed Dose: 30 Gy
Plan Total Prescribed Dose: 30 Gy
Reference Point Dosage Given to Date: 27 Gy
Reference Point Dosage Given to Date: 27 Gy
Reference Point Session Dosage Given: 3 Gy
Reference Point Session Dosage Given: 3 Gy
Session Number: 9

## 2022-07-10 ENCOUNTER — Ambulatory Visit
Admission: RE | Admit: 2022-07-10 | Discharge: 2022-07-10 | Disposition: A | Discharge status: Home / Self Care | Payer: Medicare Other | Source: Ambulatory Visit | Attending: Radiation Oncology | Admitting: Radiation Oncology

## 2022-07-10 ENCOUNTER — Other Ambulatory Visit: Payer: Self-pay

## 2022-07-10 ENCOUNTER — Inpatient Hospital Stay: Payer: Medicare Other

## 2022-07-10 DIAGNOSIS — C3492 Malignant neoplasm of unspecified part of left bronchus or lung: Secondary | ICD-10-CM

## 2022-07-10 DIAGNOSIS — Z5111 Encounter for antineoplastic chemotherapy: Secondary | ICD-10-CM | POA: Diagnosis not present

## 2022-07-10 LAB — RAD ONC ARIA SESSION SUMMARY
Course Elapsed Days: 15
Plan Fractions Treated to Date: 10
Plan Fractions Treated to Date: 10
Plan Prescribed Dose Per Fraction: 3 Gy
Plan Prescribed Dose Per Fraction: 3 Gy
Plan Total Fractions Prescribed: 10
Plan Total Fractions Prescribed: 10
Plan Total Prescribed Dose: 30 Gy
Plan Total Prescribed Dose: 30 Gy
Reference Point Dosage Given to Date: 30 Gy
Reference Point Dosage Given to Date: 30 Gy
Reference Point Session Dosage Given: 3 Gy
Reference Point Session Dosage Given: 3 Gy
Session Number: 10

## 2022-07-10 MED ORDER — PEGFILGRASTIM-FPGK 6 MG/0.6ML ~~LOC~~ SOSY
6.0000 mg | PREFILLED_SYRINGE | Freq: Once | SUBCUTANEOUS | Status: AC
  Administered 2022-07-10: 6 mg via SUBCUTANEOUS
  Filled 2022-07-10: qty 0.6

## 2022-07-13 ENCOUNTER — Telehealth: Payer: Self-pay | Admitting: Nurse Practitioner

## 2022-07-13 ENCOUNTER — Inpatient Hospital Stay: Payer: Medicare Other | Attending: Internal Medicine

## 2022-07-13 ENCOUNTER — Other Ambulatory Visit: Payer: Self-pay | Admitting: Medical Oncology

## 2022-07-13 DIAGNOSIS — C7951 Secondary malignant neoplasm of bone: Secondary | ICD-10-CM | POA: Diagnosis not present

## 2022-07-13 DIAGNOSIS — R634 Abnormal weight loss: Secondary | ICD-10-CM | POA: Diagnosis not present

## 2022-07-13 DIAGNOSIS — G893 Neoplasm related pain (acute) (chronic): Secondary | ICD-10-CM | POA: Diagnosis not present

## 2022-07-13 DIAGNOSIS — Z5112 Encounter for antineoplastic immunotherapy: Secondary | ICD-10-CM | POA: Diagnosis present

## 2022-07-13 DIAGNOSIS — Z87891 Personal history of nicotine dependence: Secondary | ICD-10-CM | POA: Diagnosis not present

## 2022-07-13 DIAGNOSIS — K5900 Constipation, unspecified: Secondary | ICD-10-CM | POA: Insufficient documentation

## 2022-07-13 DIAGNOSIS — R630 Anorexia: Secondary | ICD-10-CM | POA: Diagnosis not present

## 2022-07-13 DIAGNOSIS — C3492 Malignant neoplasm of unspecified part of left bronchus or lung: Secondary | ICD-10-CM

## 2022-07-13 DIAGNOSIS — Z5189 Encounter for other specified aftercare: Secondary | ICD-10-CM | POA: Diagnosis not present

## 2022-07-13 DIAGNOSIS — Z5111 Encounter for antineoplastic chemotherapy: Secondary | ICD-10-CM | POA: Insufficient documentation

## 2022-07-13 DIAGNOSIS — C3412 Malignant neoplasm of upper lobe, left bronchus or lung: Secondary | ICD-10-CM | POA: Insufficient documentation

## 2022-07-13 LAB — CBC WITH DIFFERENTIAL (CANCER CENTER ONLY)
Abs Immature Granulocytes: 0.12 10*3/uL — ABNORMAL HIGH (ref 0.00–0.07)
Basophils Absolute: 0 10*3/uL (ref 0.0–0.1)
Basophils Relative: 0 %
Eosinophils Absolute: 0 10*3/uL (ref 0.0–0.5)
Eosinophils Relative: 0 %
HCT: 37 % — ABNORMAL LOW (ref 39.0–52.0)
Hemoglobin: 13 g/dL (ref 13.0–17.0)
Immature Granulocytes: 1 %
Lymphocytes Relative: 6 %
Lymphs Abs: 0.8 10*3/uL (ref 0.7–4.0)
MCH: 30.1 pg (ref 26.0–34.0)
MCHC: 35.1 g/dL (ref 30.0–36.0)
MCV: 85.6 fL (ref 80.0–100.0)
Monocytes Absolute: 0.2 10*3/uL (ref 0.1–1.0)
Monocytes Relative: 2 %
Neutro Abs: 12.6 10*3/uL — ABNORMAL HIGH (ref 1.7–7.7)
Neutrophils Relative %: 91 %
Platelet Count: 163 10*3/uL (ref 150–400)
RBC: 4.32 MIL/uL (ref 4.22–5.81)
RDW: 13.1 % (ref 11.5–15.5)
Smear Review: NORMAL
WBC Count: 13.7 10*3/uL — ABNORMAL HIGH (ref 4.0–10.5)
nRBC: 0 % (ref 0.0–0.2)

## 2022-07-13 LAB — CMP (CANCER CENTER ONLY)
ALT: 34 U/L (ref 0–44)
AST: 25 U/L (ref 15–41)
Albumin: 4.3 g/dL (ref 3.5–5.0)
Alkaline Phosphatase: 112 U/L (ref 38–126)
Anion gap: 14 (ref 5–15)
BUN: 28 mg/dL — ABNORMAL HIGH (ref 8–23)
CO2: 26 mmol/L (ref 22–32)
Calcium: 9.1 mg/dL (ref 8.9–10.3)
Chloride: 94 mmol/L — ABNORMAL LOW (ref 98–111)
Creatinine: 1.57 mg/dL — ABNORMAL HIGH (ref 0.61–1.24)
GFR, Estimated: 48 mL/min — ABNORMAL LOW (ref >60)
Glucose, Bld: 236 mg/dL — ABNORMAL HIGH (ref 70–99)
Potassium: 4.1 mmol/L (ref 3.5–5.1)
Sodium: 134 mmol/L — ABNORMAL LOW (ref 135–145)
Total Bilirubin: 0.5 mg/dL (ref 0.3–1.2)
Total Protein: 8.4 g/dL — ABNORMAL HIGH (ref 6.5–8.1)

## 2022-07-13 NOTE — Telephone Encounter (Signed)
Scheduled appointment per los. Patient is aware of the made appointment. 

## 2022-07-15 NOTE — Radiation Completion Notes (Signed)
Patient Name: Albert Hardy, Albert Hardy MRN: [id number] Date of Birth: 29-May-1955 Referring Physician: Si Gaul, M.D. Date of Service: 2022-07-15 Radiation Oncologist: Arnette Schaumann, M.D. [facility] [place]                             RADIATION ONCOLOGY END OF TREATMENT NOTE     Diagnosis: C34.82 Malignant neoplasm of overlapping sites of left bronchus and lung Staging on 2022-05-29: Squamous cell carcinoma of lung, stage IV, left (HCC) T=cT2a, N=cN2, M=cM1b Intent: Palliative     ==========DELIVERED PLANS==========  First Treatment Date: 2022-06-25 - Last Treatment Date: 2022-07-10   Plan Name: Spine_L Site: Lumbar Spine Technique: 3D Mode: Photon Dose Per Fraction: 3 Gy Prescribed Dose (Delivered / Prescribed): 30 Gy / 30 Gy Prescribed Fxs (Delivered / Prescribed): 10 / 10   Plan Name: Spine_T Site: Thoracic Spine Technique: 3D Mode: Photon Dose Per Fraction: 3 Gy Prescribed Dose (Delivered / Prescribed): 30 Gy / 30 Gy Prescribed Fxs (Delivered / Prescribed): 10 / 10     ==========ON TREATMENT VISIT DATES========== 2022-06-30, 2022-07-07     ==========UPCOMING VISITS==========       ==========APPENDIX - ON TREATMENT VISIT NOTES==========   See weekly On Treatment Notes in Epic for details.

## 2022-07-16 ENCOUNTER — Telehealth: Payer: Self-pay

## 2022-07-16 ENCOUNTER — Other Ambulatory Visit: Payer: Self-pay | Admitting: Radiation Oncology

## 2022-07-16 MED ORDER — SUCRALFATE 1 G PO TABS: 1.0000 g | ORAL_TABLET | Freq: Three times a day (TID) | ORAL | 1 refills | Status: DC

## 2022-07-16 NOTE — Progress Notes (Unsigned)
Palliative Medicine [facility]  Telephone:(336) [phone] Fax:(336) [phone]   Name: Albert Hardy. Date: 07/16/2022 MRN: [id number]  DOB: 19-Feb-1955  Patient Care Team: [name], MD as PCP - General (Family Medicine)    INTERVAL HISTORY: Albert Pezza. is a 67 y.o. 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 Palliative ask to see for symptom management and goals of care   SOCIAL HISTORY:     reports that he quit smoking about 10 years ago. His smoking use included cigarettes. He has a 30.00 pack-year smoking history. He has never used smokeless tobacco. He reports current alcohol use. He reports current drug use. Frequency: 1.00 time per week. Drug: Marijuana.  ADVANCE DIRECTIVES:  None on file  CODE STATUS: Full code  PAST MEDICAL HISTORY: Past Medical History:  Diagnosis Date   Aortic regurgitation    Aortic stenosis 04/04/2018   Atherosclerosis of native arteries of the extremities with intermittent claudication 09/08/2013   Bilateral impacted cerumen 10/13/2018   Bilateral sensorineural hearing loss 12/14/2018   Dyspnea    Former moderate cigarette smoker (10-19 per day) 05/20/2012   Quit smoking Nov 2013 when hospitalized w/ HTN and chest pain(diagnosed w/ valvular heart disease)   GERD (gastroesophageal reflux disease)    Heart murmur    Hyperlipidemia    Hypertension    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  MEDICATIONS:  Current Outpatient Medications  Medication Sig Dispense Refill   albuterol (VENTOLIN HFA) 108 (90 Base) MCG/ACT inhaler Inhale 2 puffs into the lungs every 6 (six) hours as needed for wheezing or shortness of breath. 8 g 6   Ascorbic Acid  (VITAMIN C PO) Take 1 tablet by mouth daily.     Cholecalciferol (VITAMIN D3) 1000 UNITS CAPS Take 1,000 Units by mouth daily.     Coenzyme Q10 (CO Q 10 PO) Take 1 tablet by mouth daily.     Cyanocobalamin (VITAMIN B-12 PO) Take 1 tablet by mouth daily.     cyclobenzaprine (FLEXERIL) 5 MG tablet Take 1 tablet (5 mg total) by mouth 3 (three) times daily as needed for muscle spasms. 30 tablet 0   docusate sodium (COLACE) 100 MG capsule Take 1 capsule (100 mg total) by mouth every 12 (twelve) hours. 60 capsule 0   ezetimibe (ZETIA) 10 MG tablet Take 1 tablet (10 mg total) by mouth daily. 90 tablet 3   fluticasone (FLONASE) 50 MCG/ACT nasal spray Place 1-2 sprays into both nostrils daily. (Patient taking differently: Place 1-2 sprays into both nostrils daily as needed for allergies.) 16 g 6   ibuprofen (ADVIL) 200 MG tablet Take 400 mg by mouth every 6 (six) hours as needed for moderate pain.     KRILL OIL PO Take 1 tablet by mouth daily.     lisinopril-hydrochlorothiazide (ZESTORETIC) 20-12.5 MG tablet Take 2 tablets by mouth once daily 180 tablet 0   MAGNESIUM PO Take 1 tablet by mouth daily.     morphine (MS CONTIN) 15 MG 12 hr tablet Take 1 tablet (15 mg total) by mouth every 12 (twelve) hours. 60 tablet 0   Multiple Vitamins-Minerals (CENTRUM SILVER PO) Take 1 tablet by mouth daily.     Multiple Vitamins-Minerals (  ZINC PO) Take 1 tablet by mouth daily.     Naphazoline-Pheniramine (OPCON-A) 0.027-0.315 % SOLN Place 1 drop into both eyes daily as needed (redness).     nicotine (NICODERM CQ - DOSED IN MG/24 HOURS) 14 mg/24hr patch 14mg /day for 6 weeks then 7mg /day x 2 weeks 28 patch 1   omeprazole (PRILOSEC) 20 MG capsule Take 1 capsule (20 mg total) by mouth daily. 30 capsule 0   oxyCODONE (OXY IR/ROXICODONE) 5 MG immediate release tablet Take 1 tablet (5 mg total) by mouth every 4 (four) hours as needed for severe pain. 90 tablet 0   prochlorperazine (COMPAZINE) 10 MG tablet Take 1 tablet (10 mg  total) by mouth every 6 (six) hours as needed for nausea or vomiting. 30 tablet 0   rosuvastatin (CRESTOR) 5 MG tablet Take 1 tablet (5 mg total) by mouth at bedtime. 30 tablet 0   TURMERIC PO Take 1 tablet by mouth 3 (three) times a week.     umeclidinium-vilanterol (ANORO ELLIPTA) 62.5-25 MCG/ACT AEPB Inhale 1 puff into the lungs daily. (Patient taking differently: Inhale 1 puff into the lungs daily as needed (shortness of breath).) 180 each 3   No current facility-administered medications for this visit.    VITAL SIGNS: There were no vitals taken for this visit. There were no vitals filed for this visit.  Estimated body mass index is 22.93 kg/m as calculated from the following:   Height as of 06/25/22: 5\' 9"  (1.753 m).   Weight as of 07/08/22: 155 lb 4.8 oz (70.4 kg).   PERFORMANCE STATUS (ECOG) : 1 - Symptomatic but completely ambulatory   Physical Exam General: weak appearing  Cardiovascular: Tachycardic Pulmonary: normal breathing pattern  Extremities: no edema, no joint deformities Skin: no rashes Neurological: AAO x4  IMPRESSION: Albert Hardy presented to clinic for follow-up. Unfortunately he is not feeling well today. Reports over the past   Neoplasm related pain Albert Hardy reports his pain is well-controlled on current regimen.  Some days are better than others.     We discussed his regimen at length. He is currently taking MS Contin 15 mg every 12 hours, Oxy IR 5mg  every 4-6  hours as needed for breakthrough pain.  Flexeril as needed for spasms.  He is tolerating MS Contin without difficulty.   No changes to current regimen at this time.  Will continue to closely monitor.   Constipation Controlled with daily regimen.  Decreased appetite/Weight loss  Appetite is slowly improving.  Some days are better than others.  Weight is stable at 156 pounds.  Goals of Care   06/18/22-  We discussed his current illness and what it means in the larger context of hsi on-going  co-morbidities. Natural disease trajectory and expectations were discussed.  Albert Hardy and his wife are realistic in their expectations and understanding of his incurable cancer. He knows all treatment is palliative focused. Wishes to continue taking things one day at a time focusing on his quality of life allowing him every opportunity to continue to thrive.   We discussed Her current illness and what it means in the larger context of Her on-going co-morbidities. Natural disease trajectory and expectations were discussed.  I discussed the importance of continued conversation with family and their medical providers regarding overall plan of care and treatment options, ensuring decisions are within the context of the patients values and GOCs.  PLAN:  Pepcid daily Oxy IR 5mg  every 4-6 hours as needed for breakthrough pain  MS Contin  15 mg every 12 hours.  Tolerating well. Flexeril as needed Pain contract on file I will plan to see patient back in 3-4 weeks in collaboration to other oncology appointments.    Patient expressed understanding and was in agreement with this plan. He also understands that He can call the clinic at any time with any questions, concerns, or complaints.   Any controlled substances utilized were prescribed in the context of palliative care. PDMP has been reviewed.    Visit consisted of counseling and education dealing with the complex and emotionally intense issues of symptom management and palliative care in the setting of serious and potentially life-threatening illness.Greater than 50%  of this time was spent counseling and coordinating care related to the above assessment and plan.  Willette Alma, AGPCNP-BC  Palliative Medicine Team/[facility]  *Please note that this is a verbal dictation therefore any spelling or grammatical errors are due to the "[employer]agon [employer] One" system interpretation.

## 2022-07-16 NOTE — Telephone Encounter (Signed)
Rn called pt wife after being informed by Navigator that the pt was experiencing esophagitis that was causing trouble swallowing food and fluids initially. Dr. Arbutus Ped and the medical oncology team recommenced a baking soda/ salt solution (which is helping). She also mentioned that the pt has acid reflux and they maybe part of the issue as well. The patient would like to see if Dr. Roselind Messier could send in something to his pharmacy to help with this as well. Rn will reach out to [facility] and call them back with an update.

## 2022-07-16 NOTE — Telephone Encounter (Signed)
Rn called to inform pt wife that Carafate had been called into patient pharmacy. Rn also went over instructions on how to use this medication as well. Pt wife stated they would pick it up when it was ready. They were grateful for the phone call and assistance.

## 2022-07-16 NOTE — Progress Notes (Signed)
Pt's wife left message for this NN with concerns about the pain pt has had in his esophagus since his first chemo treatment on 5/29. Pt finished radiation to his T8 and L4 last on July 20, 2022. Concerns passed on in person to Dulce Sellar, Rad Onc RN who states she will reach out to the pt.

## 2022-07-19 ENCOUNTER — Other Ambulatory Visit: Payer: Self-pay | Admitting: Family Medicine

## 2022-07-19 DIAGNOSIS — E785 Hyperlipidemia, unspecified: Secondary | ICD-10-CM

## 2022-07-19 DIAGNOSIS — K219 Gastro-esophageal reflux disease without esophagitis: Secondary | ICD-10-CM

## 2022-07-20 ENCOUNTER — Inpatient Hospital Stay: Payer: Medicare Other

## 2022-07-20 ENCOUNTER — Inpatient Hospital Stay (HOSPITAL_BASED_OUTPATIENT_CLINIC_OR_DEPARTMENT_OTHER): Payer: Medicare Other | Admitting: Nurse Practitioner

## 2022-07-20 ENCOUNTER — Telehealth: Payer: Self-pay

## 2022-07-20 ENCOUNTER — Encounter: Payer: Self-pay | Admitting: Nurse Practitioner

## 2022-07-20 VITALS — BP 101/70 | HR 136 | Temp 97.7°F | Resp 23 | Wt 144.1 lb

## 2022-07-20 DIAGNOSIS — R630 Anorexia: Secondary | ICD-10-CM

## 2022-07-20 DIAGNOSIS — Z515 Encounter for palliative care: Secondary | ICD-10-CM

## 2022-07-20 DIAGNOSIS — C3492 Malignant neoplasm of unspecified part of left bronchus or lung: Secondary | ICD-10-CM | POA: Diagnosis not present

## 2022-07-20 DIAGNOSIS — R531 Weakness: Secondary | ICD-10-CM

## 2022-07-20 DIAGNOSIS — E860 Dehydration: Secondary | ICD-10-CM

## 2022-07-20 DIAGNOSIS — R1013 Epigastric pain: Secondary | ICD-10-CM

## 2022-07-20 DIAGNOSIS — R634 Abnormal weight loss: Secondary | ICD-10-CM

## 2022-07-20 DIAGNOSIS — G893 Neoplasm related pain (acute) (chronic): Secondary | ICD-10-CM

## 2022-07-20 DIAGNOSIS — R530 Neoplastic (malignant) related fatigue: Secondary | ICD-10-CM

## 2022-07-20 DIAGNOSIS — R110 Nausea: Secondary | ICD-10-CM

## 2022-07-20 DIAGNOSIS — Z5112 Encounter for antineoplastic immunotherapy: Secondary | ICD-10-CM | POA: Diagnosis not present

## 2022-07-20 LAB — CBC WITH DIFFERENTIAL/PLATELET
Abs Immature Granulocytes: 0.07 10*3/uL (ref 0.00–0.07)
Basophils Absolute: 0 10*3/uL (ref 0.0–0.1)
Basophils Relative: 0 %
Eosinophils Absolute: 0 10*3/uL (ref 0.0–0.5)
Eosinophils Relative: 0 %
HCT: 33.2 % — ABNORMAL LOW (ref 39.0–52.0)
Hemoglobin: 11.5 g/dL — ABNORMAL LOW (ref 13.0–17.0)
Immature Granulocytes: 1 %
Lymphocytes Relative: 7 %
Lymphs Abs: 0.5 10*3/uL — ABNORMAL LOW (ref 0.7–4.0)
MCH: 28.8 pg (ref 26.0–34.0)
MCHC: 34.6 g/dL (ref 30.0–36.0)
MCV: 83.2 fL (ref 80.0–100.0)
Monocytes Absolute: 0.6 10*3/uL (ref 0.1–1.0)
Monocytes Relative: 8 %
Neutro Abs: 6.4 10*3/uL (ref 1.7–7.7)
Neutrophils Relative %: 84 %
Platelets: 178 10*3/uL (ref 150–400)
RBC: 3.99 MIL/uL — ABNORMAL LOW (ref 4.22–5.81)
RDW: 12.4 % (ref 11.5–15.5)
WBC: 7.6 10*3/uL (ref 4.0–10.5)
nRBC: 0 % (ref 0.0–0.2)

## 2022-07-20 LAB — COMPREHENSIVE METABOLIC PANEL
ALT: 24 U/L (ref 0–44)
AST: 21 U/L (ref 15–41)
Albumin: 4 g/dL (ref 3.5–5.0)
Alkaline Phosphatase: 91 U/L (ref 38–126)
Anion gap: 11 (ref 5–15)
BUN: 27 mg/dL — ABNORMAL HIGH (ref 8–23)
CO2: 23 mmol/L (ref 22–32)
Calcium: 9.4 mg/dL (ref 8.9–10.3)
Chloride: 96 mmol/L — ABNORMAL LOW (ref 98–111)
Creatinine, Ser: 1.13 mg/dL (ref 0.61–1.24)
GFR, Estimated: >60 mL/min (ref >60)
Glucose, Bld: 126 mg/dL — ABNORMAL HIGH (ref 70–99)
Potassium: 4.3 mmol/L (ref 3.5–5.1)
Sodium: 130 mmol/L — ABNORMAL LOW (ref 135–145)
Total Bilirubin: 0.4 mg/dL (ref 0.3–1.2)
Total Protein: 8 g/dL (ref 6.5–8.1)

## 2022-07-20 MED ORDER — SODIUM CHLORIDE 0.9 % IV SOLN: INTRAVENOUS | Status: DC

## 2022-07-20 MED ORDER — METOCLOPRAMIDE HCL 5 MG/ML IJ SOLN
10.0000 mg | Freq: Once | INTRAMUSCULAR | Status: AC
  Administered 2022-07-20: 10 mg via INTRAVENOUS
  Filled 2022-07-20: qty 2

## 2022-07-20 MED ORDER — METOCLOPRAMIDE HCL 10 MG PO TABS
10.0000 mg | ORAL_TABLET | Freq: Three times a day (TID) | ORAL | 0 refills | Status: AC
Start: 2022-07-20 — End: ?

## 2022-07-20 MED ORDER — SODIUM CHLORIDE 0.9 % IV SOLN
10.0000 mg | Freq: Once | INTRAVENOUS | Status: AC
  Administered 2022-07-20: 10 mg via INTRAVENOUS
  Filled 2022-07-20: qty 10

## 2022-07-20 MED ORDER — FAMOTIDINE IN NACL 20-0.9 MG/50ML-% IV SOLN
20.0000 mg | Freq: Once | INTRAVENOUS | Status: AC
  Administered 2022-07-20: 20 mg via INTRAVENOUS
  Filled 2022-07-20: qty 50

## 2022-07-20 NOTE — Telephone Encounter (Signed)
Omeprazole, rosuvastatin refill request received.  Last visit May 2023.  Recent CMP noted with normal LFTs.  He was seen today by oncology, and palliative evaluation to discuss symptom management and goals of care.  It appears that treatment was palliative focused.  I am okay with him continuing these medications but if any side effects on the rosuvastatin, okay to discontinue at this time.  Let me know if there are questions.

## 2022-07-20 NOTE — Telephone Encounter (Signed)
Pt is requesting refills on  omeprazole (PRILOSEC) 20 MG  rosuvastatin (CRESTOR) 5 MG   Last refilled 06/22/2022  Last office visit 06/12/2021    What would you like to do?

## 2022-07-20 NOTE — Telephone Encounter (Signed)
See refill request encounter

## 2022-07-21 ENCOUNTER — Encounter: Payer: Self-pay | Admitting: Internal Medicine

## 2022-07-27 MED FILL — Dexamethasone Sodium Phosphate Inj 100 MG/10ML: INTRAMUSCULAR | Qty: 1 | Status: AC

## 2022-07-27 MED FILL — Fosaprepitant Dimeglumine For IV Infusion 150 MG (Base Eq): INTRAVENOUS | Qty: 5 | Status: AC

## 2022-07-28 ENCOUNTER — Inpatient Hospital Stay: Payer: Medicare Other

## 2022-07-28 ENCOUNTER — Other Ambulatory Visit: Payer: Self-pay

## 2022-07-28 ENCOUNTER — Inpatient Hospital Stay (HOSPITAL_BASED_OUTPATIENT_CLINIC_OR_DEPARTMENT_OTHER): Payer: Medicare Other | Admitting: Internal Medicine

## 2022-07-28 VITALS — BP 101/67 | HR 75 | Temp 97.8°F | Resp 16

## 2022-07-28 VITALS — BP 112/62 | HR 74 | Temp 98.2°F | Resp 18 | Ht 69.0 in | Wt 152.4 lb

## 2022-07-28 DIAGNOSIS — C3492 Malignant neoplasm of unspecified part of left bronchus or lung: Secondary | ICD-10-CM

## 2022-07-28 DIAGNOSIS — Z5112 Encounter for antineoplastic immunotherapy: Secondary | ICD-10-CM | POA: Diagnosis not present

## 2022-07-28 DIAGNOSIS — C3490 Malignant neoplasm of unspecified part of unspecified bronchus or lung: Secondary | ICD-10-CM | POA: Diagnosis not present

## 2022-07-28 LAB — CBC WITH DIFFERENTIAL (CANCER CENTER ONLY)
Abs Immature Granulocytes: 0.02 10*3/uL (ref 0.00–0.07)
Basophils Absolute: 0 10*3/uL (ref 0.0–0.1)
Basophils Relative: 0 %
Eosinophils Absolute: 0 10*3/uL (ref 0.0–0.5)
Eosinophils Relative: 0 %
HCT: 28.3 % — ABNORMAL LOW (ref 39.0–52.0)
Hemoglobin: 9.3 g/dL — ABNORMAL LOW (ref 13.0–17.0)
Immature Granulocytes: 1 %
Lymphocytes Relative: 16 %
Lymphs Abs: 0.7 10*3/uL (ref 0.7–4.0)
MCH: 29.2 pg (ref 26.0–34.0)
MCHC: 32.9 g/dL (ref 30.0–36.0)
MCV: 89 fL (ref 80.0–100.0)
Monocytes Absolute: 0.6 10*3/uL (ref 0.1–1.0)
Monocytes Relative: 14 %
Neutro Abs: 2.9 10*3/uL (ref 1.7–7.7)
Neutrophils Relative %: 69 %
Platelet Count: 193 10*3/uL (ref 150–400)
RBC: 3.18 MIL/uL — ABNORMAL LOW (ref 4.22–5.81)
RDW: 13.9 % (ref 11.5–15.5)
WBC Count: 4.2 10*3/uL (ref 4.0–10.5)
nRBC: 0 % (ref 0.0–0.2)

## 2022-07-28 LAB — CMP (CANCER CENTER ONLY)
ALT: 24 U/L (ref 0–44)
AST: 18 U/L (ref 15–41)
Albumin: 3.3 g/dL — ABNORMAL LOW (ref 3.5–5.0)
Alkaline Phosphatase: 64 U/L (ref 38–126)
Anion gap: 8 (ref 5–15)
BUN: 28 mg/dL — ABNORMAL HIGH (ref 8–23)
CO2: 25 mmol/L (ref 22–32)
Calcium: 9.5 mg/dL (ref 8.9–10.3)
Chloride: 107 mmol/L (ref 98–111)
Creatinine: 0.96 mg/dL (ref 0.61–1.24)
GFR, Estimated: >60 mL/min (ref >60)
Glucose, Bld: 126 mg/dL — ABNORMAL HIGH (ref 70–99)
Potassium: 4.1 mmol/L (ref 3.5–5.1)
Sodium: 140 mmol/L (ref 135–145)
Total Bilirubin: 0.2 mg/dL — ABNORMAL LOW (ref 0.3–1.2)
Total Protein: 6.2 g/dL — ABNORMAL LOW (ref 6.5–8.1)

## 2022-07-28 MED ORDER — SODIUM CHLORIDE 0.9 % IV SOLN: Freq: Once | INTRAVENOUS | Status: AC

## 2022-07-28 MED ORDER — DIPHENHYDRAMINE HCL 50 MG/ML IJ SOLN
50.0000 mg | Freq: Once | INTRAMUSCULAR | Status: AC
  Administered 2022-07-28: 50 mg via INTRAVENOUS
  Filled 2022-07-28: qty 1

## 2022-07-28 MED ORDER — PALONOSETRON HCL INJECTION 0.25 MG/5ML
0.2500 mg | Freq: Once | INTRAVENOUS | Status: AC
  Administered 2022-07-28: 0.25 mg via INTRAVENOUS
  Filled 2022-07-28: qty 5

## 2022-07-28 MED ORDER — SODIUM CHLORIDE 0.9 % IV SOLN
350.0000 mg | Freq: Once | INTRAVENOUS | Status: AC
  Administered 2022-07-28: 350 mg via INTRAVENOUS
  Filled 2022-07-28: qty 7

## 2022-07-28 MED ORDER — SODIUM CHLORIDE 0.9 % IV SOLN
10.0000 mg | Freq: Once | INTRAVENOUS | Status: AC
  Administered 2022-07-28: 10 mg via INTRAVENOUS
  Filled 2022-07-28: qty 10

## 2022-07-28 MED ORDER — SODIUM CHLORIDE 0.9 % IV SOLN
150.0000 mg | Freq: Once | INTRAVENOUS | Status: AC
  Administered 2022-07-28: 150 mg via INTRAVENOUS
  Filled 2022-07-28: qty 150

## 2022-07-28 MED ORDER — FAMOTIDINE IN NACL 20-0.9 MG/50ML-% IV SOLN
20.0000 mg | Freq: Once | INTRAVENOUS | Status: AC
  Administered 2022-07-28: 20 mg via INTRAVENOUS
  Filled 2022-07-28: qty 50

## 2022-07-28 MED ORDER — SODIUM CHLORIDE 0.9 % IV SOLN
175.0000 mg/m2 | Freq: Once | INTRAVENOUS | Status: AC
  Administered 2022-07-28: 330 mg via INTRAVENOUS
  Filled 2022-07-28: qty 55

## 2022-07-28 MED ORDER — SODIUM CHLORIDE 0.9 % IV SOLN
389.0000 mg | Freq: Once | INTRAVENOUS | Status: AC
  Administered 2022-07-28: 390 mg via INTRAVENOUS
  Filled 2022-07-28: qty 39

## 2022-07-28 NOTE — Patient Instructions (Signed)
[facility]  Discharge Instructions: Thank you for choosing [facility] to provide your oncology and hematology care.   If you have a lab appointment with the Cancer Center, please go directly to the Cancer Center and check in at the registration area.   Wear comfortable clothing and clothing appropriate for easy access to any Portacath or PICC line.   We strive to give you quality time with your provider. You may need to reschedule your appointment if you arrive late (15 or more minutes).  Arriving late affects you and other patients whose appointments are after yours.  Also, if you miss three or more appointments without notifying the office, you may be dismissed from the clinic at the provider's discretion.      For prescription refill requests, have your pharmacy contact our office and allow 72 hours for refills to be completed.    Today you received the following chemotherapy and/or immunotherapy agents: Cemiplimab (Libtayo), Paclitaxel (Taxol), and Carboplatin   To help prevent nausea and vomiting after your treatment, we encourage you to take your nausea medication as directed.  BELOW ARE SYMPTOMS THAT SHOULD BE REPORTED IMMEDIATELY: *FEVER GREATER THAN 100.4 F (38 C) OR HIGHER *CHILLS OR SWEATING *NAUSEA AND VOMITING THAT IS NOT CONTROLLED WITH YOUR NAUSEA MEDICATION *UNUSUAL SHORTNESS OF BREATH *UNUSUAL BRUISING OR BLEEDING *URINARY PROBLEMS (pain or burning when urinating, or frequent urination) *BOWEL PROBLEMS (unusual diarrhea, constipation, pain near the anus) TENDERNESS IN MOUTH AND THROAT WITH OR WITHOUT PRESENCE OF ULCERS (sore throat, sores in mouth, or a toothache) UNUSUAL RASH, SWELLING OR PAIN  UNUSUAL VAGINAL DISCHARGE OR ITCHING   Items with * indicate a potential emergency and should be followed up as soon as possible or go to the Emergency Department if any problems should occur.  Please show the CHEMOTHERAPY  ALERT CARD or IMMUNOTHERAPY ALERT CARD at check-in to the Emergency Department and triage nurse.  Should you have questions after your visit or need to cancel or reschedule your appointment, please contact [facility]  Dept: [phone]  and follow the prompts.  Office hours are 8:00 a.m. to 4:30 p.m. Monday - Friday. Please note that voicemails left after 4:00 p.m. may not be returned until the following business day.  We are closed weekends and major holidays. You have access to a nurse at all times for urgent questions. Please call the main number to the clinic Dept: [phone] and follow the prompts.   For any non-urgent questions, you may also contact your provider using MyChart. We now offer e-Visits for anyone 6 and older to request care online for non-urgent symptoms. For details visit [email or web address].   Also download the MyChart app! Go to the app store, search "MyChart", open the app, select [facility], and log in with your MyChart username and password.

## 2022-07-28 NOTE — Progress Notes (Signed)
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   Molecular studies by [id number] showed no actionable mutations and PD-L1 expression of 70%.  PRIOR THERAPY: Palliative radiotherapy to the T8 and L4 metastatic bone disease.Marland Kitchen  CURRENT THERAPY: Systemic chemotherapy with carboplatin for AUC of 5, paclitaxel 175 Mg/M2 and Libtayo (Cempilimab) 350 Mg IV every 3 weeks with Neulasta support for 4 cycles followed by maintenance treatment with Libtayo (Cempilimab) 350 Mg IV every 3 weeks.  He is status post 2 cycles.  INTERVAL HISTORY: Albert Hardy. 67 y.o. male returns to the clinic today for follow-up visit.  The patient is feeling fine today with no concerning complaints.  He had some odynophagia after the palliative radiotherapy to the metastatic bone disease at the thoracic spine but this is improved with pain medication and Carafate.  He denied having any current chest pain, shortness of breath, cough or hemoptysis.  He has no nausea, vomiting, diarrhea or constipation.  He has no headache or visual changes.  He has no recent weight loss or night sweats.  He is here today for evaluation before starting cycle #3.  MEDICAL HISTORY: Past Medical History:  Diagnosis Date   Aortic regurgitation    Aortic stenosis 04/04/2018   Atherosclerosis of native arteries of the extremities with intermittent claudication 09/08/2013   Bilateral impacted cerumen 10/13/2018   Bilateral sensorineural hearing loss 12/14/2018   Dyspnea    Former moderate cigarette smoker (10-19 per day)  05/20/2012   Quit smoking Nov 2013 when hospitalized w/ HTN and chest pain(diagnosed w/ valvular heart disease)   GERD (gastroesophageal reflux disease)    Heart murmur    Hyperlipidemia    Hypertension    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  MEDICATIONS:  Current Outpatient Medications  Medication Sig Dispense Refill   sucralfate (CARAFATE) 1 g tablet Take 1 tablet (1 g total) by mouth 4 (four) times daily -  with meals and at bedtime. Crush and dissolve in 10 mL of warm water prior to swallowing, take 20 to 30 minutes before meals 60 tablet 1   albuterol (VENTOLIN HFA) 108 (90 Base) MCG/ACT inhaler Inhale 2 puffs into the lungs every 6 (six) hours as needed for wheezing or shortness of breath. 8 g 6   Ascorbic Acid (VITAMIN C PO) Take 1 tablet by mouth daily.     Cholecalciferol (VITAMIN D3) 1000 UNITS CAPS Take 1,000 Units by mouth daily.     Coenzyme Q10 (CO Q 10 PO) Take 1 tablet by mouth daily.     Cyanocobalamin (VITAMIN B-12 PO) Take 1 tablet by mouth daily.     cyclobenzaprine (FLEXERIL) 5 MG tablet Take 1 tablet (5 mg total) by mouth 3 (three) times daily as needed for muscle spasms. 30 tablet 0   docusate sodium (COLACE) 100 MG capsule Take 1 capsule (100 mg  total) by mouth every 12 (twelve) hours. 60 capsule 0   ezetimibe (ZETIA) 10 MG tablet Take 1 tablet (10 mg total) by mouth daily. 90 tablet 3   fluticasone (FLONASE) 50 MCG/ACT nasal spray Place 1-2 sprays into both nostrils daily. (Patient taking differently: Place 1-2 sprays into both nostrils daily as needed for allergies.) 16 g 6   ibuprofen (ADVIL) 200 MG tablet Take 400 mg by mouth every 6 (six) hours as needed for moderate pain.     KRILL OIL PO Take 1 tablet by mouth daily.     lisinopril-hydrochlorothiazide  (ZESTORETIC) 20-12.5 MG tablet Take 2 tablets by mouth once daily 180 tablet 0   MAGNESIUM PO Take 1 tablet by mouth daily.     metoCLOPramide (REGLAN) 10 MG tablet Take 1 tablet (10 mg total) by mouth 4 (four) times daily -  before meals and at bedtime. 56 tablet 0   morphine (MS CONTIN) 15 MG 12 hr tablet Take 1 tablet (15 mg total) by mouth every 12 (twelve) hours. 60 tablet 0   Multiple Vitamins-Minerals (CENTRUM SILVER PO) Take 1 tablet by mouth daily.     Multiple Vitamins-Minerals (ZINC PO) Take 1 tablet by mouth daily.     Naphazoline-Pheniramine (OPCON-A) 0.027-0.315 % SOLN Place 1 drop into both eyes daily as needed (redness).     nicotine (NICODERM CQ - DOSED IN MG/24 HOURS) 14 mg/24hr patch 14mg /day for 6 weeks then 7mg /day x 2 weeks 28 patch 1   omeprazole (PRILOSEC) 20 MG capsule Take 1 capsule by mouth once daily 30 capsule 0   oxyCODONE (OXY IR/ROXICODONE) 5 MG immediate release tablet Take 1 tablet (5 mg total) by mouth every 4 (four) hours as needed for severe pain. 90 tablet 0   prochlorperazine (COMPAZINE) 10 MG tablet Take 1 tablet (10 mg total) by mouth every 6 (six) hours as needed for nausea or vomiting. 30 tablet 0   rosuvastatin (CRESTOR) 5 MG tablet TAKE 1 TABLET BY MOUTH AT BEDTIME 30 tablet 0   TURMERIC PO Take 1 tablet by mouth 3 (three) times a week.     umeclidinium-vilanterol (ANORO ELLIPTA) 62.5-25 MCG/ACT AEPB Inhale 1 puff into the lungs daily. (Patient taking differently: Inhale 1 puff into the lungs daily as needed (shortness of breath).) 180 each 3   No current facility-administered medications for this visit.    SURGICAL HISTORY:  Past Surgical History:  Procedure Laterality Date   BRONCHIAL BIOPSY  05/26/2022   Procedure: BRONCHIAL BIOPSIES;  Surgeon: Josephine Igo, DO;  Location: [facility];  Service: Pulmonary;;   BRONCHIAL BRUSHINGS  05/26/2022   Procedure: BRONCHIAL BRUSHINGS;  Surgeon: Josephine Igo, DO;  Location: [facility];  Service:  Pulmonary;;   NO PAST SURGERIES     VIDEO BRONCHOSCOPY  05/26/2022   Procedure: VIDEO BRONCHOSCOPY WITHOUT FLUORO;  Surgeon: Josephine Igo, DO;  Location: [facility];  Service: Pulmonary;;    REVIEW OF SYSTEMS:  A comprehensive review of systems was negative except for: Constitutional: positive for fatigue   PHYSICAL EXAMINATION: General appearance: alert, cooperative, and no distress Head: Normocephalic, without obvious abnormality, atraumatic Neck: no adenopathy, no JVD, supple, symmetrical, trachea midline, and thyroid not enlarged, symmetric, no tenderness/mass/nodules Lymph nodes: Cervical, supraclavicular, and axillary nodes normal. Resp: clear to auscultation bilaterally Back: symmetric, no curvature. ROM normal. No CVA tenderness. Cardio: regular rate and rhythm, S1, S2 normal, no murmur, click, rub or gallop GI: soft, non-tender; bowel sounds normal; no masses,  no organomegaly Extremities:  extremities normal, atraumatic, no cyanosis or edema  ECOG PERFORMANCE STATUS: 1 - Symptomatic but completely ambulatory  Blood pressure 112/62, pulse 74, temperature 98.2 F (36.8 C), temperature source Oral, resp. rate 18, height 5\' 9"  (1.753 m), weight 152 lb 6.4 oz (69.1 kg), SpO2 100 %.  LABORATORY DATA: Lab Results  Component Value Date   WBC 4.2 07/28/2022   HGB 9.3 (L) 07/28/2022   HCT 28.3 (L) 07/28/2022   MCV 89.0 07/28/2022   PLT 193 07/28/2022      Chemistry      Component Value Date/Time   NA 130 (L) 07/20/2022 1440   NA 145 (H) 10/21/2021 1038   K 4.3 07/20/2022 1440   CL 96 (L) 07/20/2022 1440   CO2 23 07/20/2022 1440   BUN 27 (H) 07/20/2022 1440   BUN 19 10/21/2021 1038   CREATININE 1.13 07/20/2022 1440   CREATININE 1.57 (H) 07/13/2022 1315   CREATININE 1.07 08/27/2014 1511      Component Value Date/Time   CALCIUM 9.4 07/20/2022 1440   ALKPHOS 91 07/20/2022 1440   AST 21 07/20/2022 1440   AST 25 07/13/2022 1315   ALT 24 07/20/2022 1440   ALT 34  07/13/2022 1315   BILITOT 0.4 07/20/2022 1440   BILITOT 0.5 07/13/2022 1315       RADIOGRAPHIC STUDIES: No results found.  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  Molecular studies by [id number] showed no actionable mutations and PD-L1 expression of 70%. The patient is currently undergoing palliative combination of systemic chemotherapy with carboplatin for AUC of 5, paclitaxel 175 Mg/M2 and immunotherapy with Libtayo (Cempilimab) 350 Mg IV every 3 weeks with Neulasta support for 4 cycles followed by maintenance treatment with Libtayo (Cempilimab) of the patient has no disease progression after cycle #4.  He is status post 2 cycles. The patient has been tolerating this treatment fairly well except for mild fatigue. I recommended for him to proceed with cycle #3 today as planned. I will see him back for follow-up visit in 3 weeks for evaluation with repeat CT scan of the chest, abdomen and pelvis for restaging of his disease. For the anemia, he was advised to start taking multivitamins as well as iron supplements but this is likely secondary to his chemotherapy and radiation.  Will continue to monitor closely and consider him for transfusion if his hemoglobin is less than 8. For the back pain, he is currently undergoing palliative radiotherapy to the T8 and L4 vertebral lesions and his pain medication is managed by the palliative care team. For the bone metastasis, he will continue his treatment with Xgeva 120 mg subcutaneously every 6 weeks. The patient was advised to call immediately if he has any other concerning symptoms in the interval. The patient voices  understanding of current disease status and treatment options and is in agreement with the current care plan.  All questions were answered. The patient knows to call the clinic with any problems, questions or concerns. We can certainly see the patient much sooner if necessary.  The total time spent in the appointment was 20 minutes.  Disclaimer: This note was dictated with voice recognition software. Similar sounding words can inadvertently  be transcribed and may not be corrected upon review.

## 2022-07-28 NOTE — Progress Notes (Signed)
Dr. Arbutus Ped ordered 390 mg of Carboplatin. Will continue 390 mg dosing.  Demetrius Charity, PharmD

## 2022-07-29 NOTE — Progress Notes (Signed)
[facility] critical illness packet completed for this patient.  Spoke with wife and made her aware that package is available for pick up at registration desk when patient comes in for injection on 07/30/22.  She acknowledged understanding.  Packet includes forms, last office notes and pathology report.  No further questions or concerns noted at this time.

## 2022-07-30 ENCOUNTER — Other Ambulatory Visit: Payer: Self-pay

## 2022-07-30 ENCOUNTER — Inpatient Hospital Stay: Payer: Medicare Other

## 2022-07-30 DIAGNOSIS — C3492 Malignant neoplasm of unspecified part of left bronchus or lung: Secondary | ICD-10-CM

## 2022-07-30 DIAGNOSIS — Z5112 Encounter for antineoplastic immunotherapy: Secondary | ICD-10-CM | POA: Diagnosis not present

## 2022-07-30 MED ORDER — PEGFILGRASTIM-FPGK 6 MG/0.6ML ~~LOC~~ SOSY
6.0000 mg | PREFILLED_SYRINGE | Freq: Once | SUBCUTANEOUS | Status: AC
  Administered 2022-07-30: 6 mg via SUBCUTANEOUS
  Filled 2022-07-30: qty 0.6

## 2022-07-30 MED ORDER — DENOSUMAB 120 MG/1.7ML ~~LOC~~ SOLN
120.0000 mg | Freq: Once | SUBCUTANEOUS | Status: AC
  Administered 2022-07-30: 120 mg via SUBCUTANEOUS
  Filled 2022-07-30: qty 1.7

## 2022-08-06 ENCOUNTER — Other Ambulatory Visit: Payer: Self-pay | Admitting: Internal Medicine

## 2022-08-06 ENCOUNTER — Telehealth: Payer: Self-pay

## 2022-08-06 ENCOUNTER — Ambulatory Visit (HOSPITAL_COMMUNITY)
Admission: RE | Admit: 2022-08-06 | Discharge: 2022-08-06 | Disposition: A | Discharge status: Home / Self Care | Payer: Medicare Other | Source: Ambulatory Visit | Attending: Internal Medicine | Admitting: Internal Medicine

## 2022-08-06 DIAGNOSIS — C3490 Malignant neoplasm of unspecified part of unspecified bronchus or lung: Secondary | ICD-10-CM | POA: Insufficient documentation

## 2022-08-06 MED ORDER — SODIUM CHLORIDE (PF) 0.9 % IJ SOLN
INTRAMUSCULAR | Status: AC
  Filled 2022-08-06: qty 50

## 2022-08-06 MED ORDER — IOHEXOL 300 MG/ML  SOLN
100.0000 mL | Freq: Once | INTRAMUSCULAR | Status: AC | PRN
  Administered 2022-08-06: 100 mL via INTRAVENOUS

## 2022-08-06 MED ORDER — APIXABAN (ELIQUIS) VTE STARTER PACK (10MG AND 5MG): ORAL_TABLET | ORAL | 0 refills | Status: DC

## 2022-08-06 NOTE — Telephone Encounter (Signed)
This nurse reached out to patient and made aware of scan showing he has a Pulmonary Embolism.  Advised that the provider has called in an order for Eliquis and he should start taking it today.  Explained to take 1 tab daily for 7 days, on day 8 switch to 1 tab twice daily .  Educated patient on bleeding precautions while taking mediation.  Patient acknowledged understanding and knows to call clinic if there are any questions or concerns.

## 2022-08-09 ENCOUNTER — Other Ambulatory Visit: Payer: Self-pay | Admitting: Nurse Practitioner

## 2022-08-09 DIAGNOSIS — G893 Neoplasm related pain (acute) (chronic): Secondary | ICD-10-CM

## 2022-08-09 DIAGNOSIS — Z515 Encounter for palliative care: Secondary | ICD-10-CM

## 2022-08-09 DIAGNOSIS — C3492 Malignant neoplasm of unspecified part of left bronchus or lung: Secondary | ICD-10-CM

## 2022-08-09 MED ORDER — OXYCODONE HCL 5 MG PO TABS
5.0000 mg | ORAL_TABLET | ORAL | 0 refills | Status: DC | PRN
Start: 2022-08-09 — End: 2022-09-16

## 2022-08-09 MED ORDER — MORPHINE SULFATE ER 15 MG PO TBCR
15.0000 mg | EXTENDED_RELEASE_TABLET | Freq: Two times a day (BID) | ORAL | 0 refills | Status: DC
Start: 2022-08-09 — End: 2022-09-16

## 2022-08-10 ENCOUNTER — Telehealth: Payer: Self-pay | Admitting: Internal Medicine

## 2022-08-10 NOTE — Telephone Encounter (Signed)
Called patient regarding July appointments, left a voicemail. 

## 2022-08-12 ENCOUNTER — Encounter: Payer: Self-pay | Admitting: Radiation Oncology

## 2022-08-15 NOTE — Progress Notes (Signed)
  Radiation Oncology         [phone]) [phone] ________________________________  Name: Albert Hardy. MRN: [id number]  Date: 08/17/2022  DOB: [birth year]  End of Treatment Note  Diagnosis: The encounter diagnosis was Squamous cell carcinoma of lung, stage IV, left (HCC).   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    Cancer Staging  Squamous cell carcinoma of lung, stage IV, left (HCC) Staging form: Lung, AJCC 8th Edition - Clinical: Stage IVA (cT2a, cN2, cM1b) - Signed by Si Gaul, MD on 05/29/2022  Indication for treatment: palliative        Radiation treatment dates: 06/25/22 through 07/10/22  Site/dose:  1) lumbar spine - 30 Gy delivered in 10 Fx at 3 Gy/Fx 2) thoracic spine - 30 Gy delivered in 10 Fx at 3 Gy/Fx  Beams/energy: 15X, 10X  Technique/Mode: 3D / Photon   Narrative: The patient tolerated radiation treatment relatively well. During his final weekly treatment check on 07/07/22, the patient endorsed fatigue which he noted as very mild and manageable. He denied any other side effects or concerns with radiation treatment, and reported achieving an improvement in his pain with treatment.   Plan: The patient has completed radiation treatment. The patient will return to radiation oncology clinic for routine followup in one month. I advised them to call or return sooner if they have any questions or concerns related to their recovery or treatment.  -----------------------------------  Billie Lade, PhD, MD  This document serves as a record of services personally performed by Antony Blackbird, MD. It was created on his behalf by Neena Rhymes, a trained medical scribe. The  creation of this record is based on the scribe's personal observations and the provider's statements to them. This document has been checked and approved by the attending provider.

## 2022-08-15 NOTE — Progress Notes (Unsigned)
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    Molecular studies by [id number] showed no actionable mutations and PD-L1 expression of 70%.  PRIOR THERAPY:  Palliative radiotherapy to the T8 and L4 metastatic bone disease.  CURRENT THERAPY:   1) Systemic chemotherapy with carboplatin for AUC of 5, paclitaxel 175 Mg/M2 and Libtayo (Cempilimab) 350 Mg IV every 3 weeks with Neulasta support for 4 cycles followed by maintenance treatment with Libtayo (Cempilimab) 350 Mg IV every 3 weeks.  He is status post 3 cycles.  2) Rivka Barbara every 6 weeks  INTERVAL HISTORY: Albert Hardy. 67 y.o. male returns to the clinic today for a follow-up visit accompanied by his wife.  The patient completed palliative radiation to the metastatic bone lesions and he is currently undergoing systemic chemotherapy and immunotherapy.  He is status post 3 cycles and is tolerated it fairly well except for some pain after the neulasta injections. Fortunately, this is the last cycle of treatment that will require GCSF. Starting from the next cycle of treatment, he will start maintenance immunotherapy with Libtayo.  In the interval since last being seen, the patient had a restaging CT scan which incidentally noted a segmental pulmonary embolus in the right lower lobe therefore the patient was started on Eliquis and he is tolerating this well.  He was not aware to avoid NSAIDs. He denies any abnormal bleeding or bruising.   The patient does have anemia at baseline for which Dr. Arbutus Ped told the patient to take iron supplements at his last appointment. He has  not been able to pick this up at this time.   Regarding his systemic treatment, he is tolerating it well without any concerning adverse side effects.  Today he denies any fever, chills, or night sweats. He lost weight but reports a good appetite. He is not drinking any protein supplemental drinks. He denies any chest pain or hemoptysis. He has some intermittent dyspnea on exertion which is stable.  Denies any nausea, vomiting, or diarrhea. He takes colace for constipation but still has constipation frequently.  Denies any headaches. He reports sometimes his vision gets blurry. It has been about a year since his last eye exam. Denies any rashes or skin changes.  He receives Xgeva every 6 weeks for his metastatic bone lesions for which he already completed palliative radiation.  He follows closely with palliative care for which he is currently taking Flexeril and oxycodone.  He also is currently taking Reglan, dexamethasone, IV fluid, and Pepcid. He is here today for evaluation and to review his CT scan before proceeding with cycle #4     MEDICAL HISTORY: Past Medical History:  Diagnosis Date   Aortic regurgitation    Aortic stenosis 04/04/2018   Atherosclerosis of native arteries of the extremities with intermittent claudication 09/08/2013   Bilateral impacted cerumen 10/13/2018   Bilateral sensorineural hearing loss 12/14/2018   Dyspnea    Former moderate cigarette smoker (10-19 per day) 05/20/2012   Quit smoking Nov 2013 when hospitalized w/ HTN and chest pain(diagnosed w/ valvular heart disease)   GERD (gastroesophageal reflux disease)    Heart murmur  History of radiation therapy    Lumbar Spine, Thoracic Spine- 06/25/22-07/10/22- Dr. 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  MEDICATIONS:  Current Outpatient Medications  Medication Sig Dispense Refill   apixaban (ELIQUIS) 5 MG TABS tablet Take 1 tablet (5 mg total) by mouth 2 (two) times daily. 60 tablet 2   albuterol (VENTOLIN HFA) 108 (90 Base) MCG/ACT inhaler Inhale 2 puffs into the lungs every 6 (six) hours as needed for wheezing or shortness of breath. (Patient not taking: Reported on 08/17/2022) 8 g 6   APIXABAN (ELIQUIS) VTE STARTER PACK (10MG  AND 5MG ) Take as directed on package: start with two-5mg  tablets twice daily for 7 days. On day 8, switch to one-5mg  tablet twice daily. 74 each 0   Ascorbic Acid (VITAMIN C PO) Take 1 tablet by mouth daily.     Cholecalciferol (VITAMIN D3) 1000 UNITS CAPS Take 1,000 Units by mouth daily.     Coenzyme Q10 (CO Q 10 PO) Take 1 tablet by mouth daily.     Cyanocobalamin (VITAMIN B-12 PO) Take 1 tablet by mouth daily.     cyclobenzaprine (FLEXERIL) 5 MG tablet Take 1 tablet (5 mg total) by mouth 3 (three) times daily as needed for muscle spasms. (Patient not taking: Reported on 08/17/2022) 30 tablet 0   docusate sodium (COLACE) 100 MG capsule Take 1 capsule (100 mg total) by mouth every 12 (twelve) hours. (Patient not taking: Reported on 08/17/2022) 60 capsule 0   ezetimibe (ZETIA) 10 MG tablet Take 1 tablet (10 mg total) by mouth daily. (Patient not taking: Reported on 08/17/2022) 90 tablet 3   fluticasone (FLONASE) 50 MCG/ACT nasal spray Place 1-2 sprays into both nostrils daily. (Patient not taking: Reported on 08/17/2022) 16 g 6   KRILL OIL PO Take 1 tablet by mouth daily.     lisinopril-hydrochlorothiazide (ZESTORETIC) 20-12.5 MG tablet Take 2 tablets by mouth once daily 180 tablet 0   MAGNESIUM PO Take 1 tablet by mouth daily.     metoCLOPramide (REGLAN) 10 MG tablet Take 1 tablet (10 mg total) by mouth 4 (four) times daily -  before meals and at bedtime. 56 tablet 0   morphine (MS CONTIN) 15 MG 12 hr tablet Take 1 tablet (15 mg total) by  mouth every 12 (twelve) hours. 60 tablet 0   Multiple Vitamins-Minerals (CENTRUM SILVER PO) Take 1 tablet by mouth daily.     Multiple Vitamins-Minerals (ZINC PO) Take 1 tablet by mouth daily.     Naphazoline-Pheniramine (OPCON-A) 0.027-0.315 % SOLN Place 1 drop into both eyes daily as needed (redness).     nicotine (NICODERM CQ - DOSED IN MG/24 HOURS) 14 mg/24hr patch 14mg /day for 6 weeks then 7mg /day x 2 weeks (Patient not taking: Reported on 08/17/2022) 28 patch 1   omeprazole (PRILOSEC) 20 MG capsule Take 1 capsule (20 mg total) by mouth daily. 30 capsule 0   oxyCODONE (OXY IR/ROXICODONE) 5 MG immediate release tablet Take 1 tablet (5 mg total) by mouth every 4 (four) hours as needed for severe pain. 90 tablet 0   prochlorperazine (COMPAZINE) 10 MG tablet Take 1 tablet (10 mg total) by mouth every 6 (six) hours as needed for nausea or vomiting. 30  tablet 0   rosuvastatin (CRESTOR) 5 MG tablet Take 1 tablet (5 mg total) by mouth at bedtime. 30 tablet 0   sucralfate (CARAFATE) 1 g tablet Take 1 tablet (1 g total) by mouth 4 (four) times daily -  with meals and at bedtime. Crush and dissolve in 10 mL of warm water prior to swallowing, take 20 to 30 minutes before meals 60 tablet 1   TURMERIC PO Take 1 tablet by mouth 3 (three) times a week.     umeclidinium-vilanterol (ANORO ELLIPTA) 62.5-25 MCG/ACT AEPB Inhale 1 puff into the lungs daily. (Patient not taking: Reported on 08/17/2022) 180 each 3   No current facility-administered medications for this visit.    SURGICAL HISTORY:  Past Surgical History:  Procedure Laterality Date   BRONCHIAL BIOPSY  05/26/2022   Procedure: BRONCHIAL BIOPSIES;  Surgeon: Josephine Igo, DO;  Location: [facility];  Service: Pulmonary;;   BRONCHIAL BRUSHINGS  05/26/2022   Procedure: BRONCHIAL BRUSHINGS;  Surgeon: Josephine Igo, DO;  Location: [facility];  Service: Pulmonary;;   NO PAST SURGERIES     VIDEO BRONCHOSCOPY  05/26/2022   Procedure: VIDEO BRONCHOSCOPY  WITHOUT FLUORO;  Surgeon: Josephine Igo, DO;  Location: [facility];  Service: Pulmonary;;    REVIEW OF SYSTEMS:   Review of Systems  Constitutional: Positive for weight loss.  Negative for appetite change, chills, fatigue, and fever.   HENT: Negative for mouth sores, nosebleeds, sore throat and trouble swallowing.   Eyes: Negative for eye problems and icterus.  Respiratory: Positive for stable dyspnea on exertion.  Negative for cough, hemoptysis, and wheezing.   Cardiovascular: Negative for chest pain and leg swelling.  Gastrointestinal: Positive for constipation.  Negative for abdominal pain,  diarrhea, nausea and vomiting.  Genitourinary: Negative for bladder incontinence, difficulty urinating, dysuria, frequency and hematuria.   Musculoskeletal: Negative for back pain, gait problem, neck pain and neck stiffness.  Skin: Negative for itching and rash.  Neurological: Negative for dizziness, extremity weakness, gait problem, headaches, light-headedness and seizures.  Hematological: Negative for adenopathy. Does not bruise/bleed easily.  Psychiatric/Behavioral: Negative for confusion, depression and sleep disturbance. The patient is not nervous/anxious.     PHYSICAL EXAMINATION:  Blood pressure (!) 115/55, pulse 72, temperature 98.2 F (36.8 C), temperature source Oral, resp. rate 13, weight 146 lb 14.4 oz (66.6 kg), SpO2 100 %.  ECOG PERFORMANCE STATUS: 1  Physical Exam  Constitutional: Oriented to person, place, and time and well-developed, well-nourished, and in no distress. HENT:  Head: Normocephalic and atraumatic.  Mouth/Throat: Oropharynx is clear and moist. No oropharyngeal exudate.  Eyes: Conjunctivae are normal. Right eye exhibits no discharge. Left eye exhibits no discharge. No scleral icterus.  Neck: Normal range of motion. Neck supple.  Cardiovascular: Normal rate, regular rhythm, normal heart sounds and intact distal pulses.   Pulmonary/Chest: Effort normal and  breath sounds normal. No respiratory distress. No wheezes. No rales.  Abdominal: Soft. Bowel sounds are normal. Exhibits no distension and no mass. There is no tenderness.  Musculoskeletal: Normal range of motion. Exhibits no edema.  Lymphadenopathy:    No cervical adenopathy.  Neurological: Alert and oriented to person, place, and time. Exhibits normal muscle tone. Gait normal. Coordination normal.  Skin: Skin is warm and dry. No rash noted. Not diaphoretic. No erythema. No pallor.  Psychiatric: Mood, memory and judgment normal.  Vitals reviewed.  LABORATORY DATA: Lab Results  Component Value Date   WBC 4.6 08/18/2022   HGB 9.6 (L) 08/18/2022  HCT 29.7 (L) 08/18/2022   MCV 91.1 08/18/2022   PLT 237 08/18/2022      Chemistry      Component Value Date/Time   NA 140 08/18/2022 1008   NA 145 (H) 10/21/2021 1038   K 3.7 08/18/2022 1008   CL 105 08/18/2022 1008   CO2 27 08/18/2022 1008   BUN 19 08/18/2022 1008   BUN 19 10/21/2021 1038   CREATININE 1.09 08/18/2022 1008   CREATININE 1.07 08/27/2014 1511      Component Value Date/Time   CALCIUM 9.0 08/18/2022 1008   ALKPHOS 71 08/18/2022 1008   AST 15 08/18/2022 1008   ALT 21 08/18/2022 1008   BILITOT 0.3 08/18/2022 1008       RADIOGRAPHIC STUDIES:  CT Chest W Contrast  Result Date: 08/06/2022 CLINICAL DATA:  67 year old male with history of non-small cell lung cancer. * Tracking Code: BO * EXAM: CT CHEST, ABDOMEN, AND PELVIS WITH CONTRAST TECHNIQUE: Multidetector CT imaging of the chest, abdomen and pelvis was performed following the standard protocol during bolus administration of intravenous contrast. RADIATION DOSE REDUCTION: This exam was performed according to the departmental dose-optimization program which includes automated exposure control, adjustment of the mA and/or kV according to patient size and/or use of iterative reconstruction technique. CONTRAST:  OMNIPAQUE IOHEXOL 300 MG/ML  SOLN COMPARISON:  Multiple  priors, most recently PET-CT 06/04/2022. FINDINGS: CT CHEST FINDINGS Cardiovascular: In a segmental sized pulmonary artery branch to the right lower lobe there is a small filling defect (axial image 38 of series 2), concerning for pulmonary embolism. Heart size is normal. There is no significant pericardial fluid, thickening or pericardial calcification. Aortic atherosclerosis. No definite coronary artery calcifications. Mediastinum/Nodes: No pathologically enlarged mediastinal or hilar lymph nodes. Prominent amorphous soft tissue in the left hilar region likely reflects treated lymphadenopathy, measuring up to 7 mm in thickness (axial image 27 of series 2). Likewise, previously noted hypermetabolic AP window lymphadenopathy has substantially regressed now measuring only 7 mm in short axis indicating a positive response to therapy. Esophagus is unremarkable in appearance. No axillary lymphadenopathy. Lungs/Pleura: Regression of previously noted perihilar pulmonary nodule, with some residual soft tissue thickening in the perihilar aspect of the left upper lobe, indicating a positive response to therapy. 4 mm pulmonary nodule in the base of the right lower lobe (axial image 130 of series 6), unchanged. No other definite new suspicious appearing pulmonary nodules or masses are noted. No acute consolidative airspace disease. No pleural effusions. Musculoskeletal: Large lytic lesion in the posterior aspect of the left side of T8 vertebral body extending into the posterior elements measuring approximately 2.9 x 1.9 cm, similar to prior PET-CT. No other new aggressive appearing lytic or blastic lesions are confidently identified elsewhere in the visualized axial or appendicular skeleton. CT ABDOMEN PELVIS FINDINGS Hepatobiliary: No suspicious cystic or solid hepatic lesions. No intra or extrahepatic biliary ductal dilatation. Gallbladder is normal in appearance. Pancreas: No pancreatic mass. No pancreatic ductal  dilatation. No pancreatic or peripancreatic fluid collections or inflammatory changes. Spleen: Unremarkable. Adrenals/Urinary Tract: 7 mm exophytic lesion in the lower pole of the left kidney posteriorly (axial image 70 of series 2) is too small to characterize. Right kidney and bilateral adrenal glands are otherwise normal in appearance. No hydroureteronephrosis. Urinary bladder is unremarkable in appearance. Stomach/Bowel: The appearance of the stomach is unremarkable. No pathologic dilatation of small bowel or colon. Normal appendix. Vascular/Lymphatic: Atherosclerosis in the abdominal aorta and pelvic vasculature, without evidence of aneurysm or dissection. Moderate  to severe stenosis at the origin of the right superficial femoral artery. No lymphadenopathy noted in the abdomen or pelvis. Reproductive: Prostate gland and seminal vesicles are unremarkable in appearance. Other: 7 mm soft tissue attenuation nodule in the right retroperitoneum (axial image 82 of series 2) decreased in size compared to the prior study (previously 13 mm and hypermetabolic on prior PET-CT), indicating a positive response to therapy. No significant volume of ascites. No pneumoperitoneum. Musculoskeletal: Lytic lesion centered in the right-side of the L4 vertebral body measuring 2.9 x 2.3 cm, previously hypermetabolic on prior PET-CT. No new aggressive appearing lytic or blastic lesions are noted elsewhere in the visualized portions of the axial or appendicular skeleton. IMPRESSION: 1. Incidental segmental sized pulmonary embolus to the right lower lobe. 2. Today's study demonstrates a positive response to therapy in terms of regression of the primary centrally located perihilar left upper lobe pulmonary nodule, along with regression of left hilar and AP window lymphadenopathy, and soft tissue metastatic lesion to the right retroperitoneum. 3. 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 No new osseous lesions  are noted. 4. Indeterminate lesion in the lower pole of the left kidney measuring only 7 mm, too small to definitively characterize. Continued attention on follow-up studies is recommended to ensure stability. 5. Aortic atherosclerosis. 6. Additional incidental findings, as above. These results will be called to the ordering clinician or representative by the Radiologist Assistant, and communication documented in the PACS or [employer]. Electronically Signed   By: Trudie Reed M.D.   On: 08/06/2022 09:53   CT Abdomen Pelvis W Contrast  Result Date: 08/06/2022 CLINICAL DATA:  67 year old male with history of non-small cell lung cancer. * Tracking Code: BO * EXAM: CT CHEST, ABDOMEN, AND PELVIS WITH CONTRAST TECHNIQUE: Multidetector CT imaging of the chest, abdomen and pelvis was performed following the standard protocol during bolus administration of intravenous contrast. RADIATION DOSE REDUCTION: This exam was performed according to the departmental dose-optimization program which includes automated exposure control, adjustment of the mA and/or kV according to patient size and/or use of iterative reconstruction technique. CONTRAST:  OMNIPAQUE IOHEXOL 300 MG/ML  SOLN COMPARISON:  Multiple priors, most recently PET-CT 06/04/2022. FINDINGS: CT CHEST FINDINGS Cardiovascular: In a segmental sized pulmonary artery branch to the right lower lobe there is a small filling defect (axial image 38 of series 2), concerning for pulmonary embolism. Heart size is normal. There is no significant pericardial fluid, thickening or pericardial calcification. Aortic atherosclerosis. No definite coronary artery calcifications. Mediastinum/Nodes: No pathologically enlarged mediastinal or hilar lymph nodes. Prominent amorphous soft tissue in the left hilar region likely reflects treated lymphadenopathy, measuring up to 7 mm in thickness (axial image 27 of series 2). Likewise, previously noted hypermetabolic AP window  lymphadenopathy has substantially regressed now measuring only 7 mm in short axis indicating a positive response to therapy. Esophagus is unremarkable in appearance. No axillary lymphadenopathy. Lungs/Pleura: Regression of previously noted perihilar pulmonary nodule, with some residual soft tissue thickening in the perihilar aspect of the left upper lobe, indicating a positive response to therapy. 4 mm pulmonary nodule in the base of the right lower lobe (axial image 130 of series 6), unchanged. No other definite new suspicious appearing pulmonary nodules or masses are noted. No acute consolidative airspace disease. No pleural effusions. Musculoskeletal: Large lytic lesion in the posterior aspect of the left side of T8 vertebral body extending into the posterior elements measuring approximately 2.9 x 1.9 cm, similar to  prior PET-CT. No other new aggressive appearing lytic or blastic lesions are confidently identified elsewhere in the visualized axial or appendicular skeleton. CT ABDOMEN PELVIS FINDINGS Hepatobiliary: No suspicious cystic or solid hepatic lesions. No intra or extrahepatic biliary ductal dilatation. Gallbladder is normal in appearance. Pancreas: No pancreatic mass. No pancreatic ductal dilatation. No pancreatic or peripancreatic fluid collections or inflammatory changes. Spleen: Unremarkable. Adrenals/Urinary Tract: 7 mm exophytic lesion in the lower pole of the left kidney posteriorly (axial image 70 of series 2) is too small to characterize. Right kidney and bilateral adrenal glands are otherwise normal in appearance. No hydroureteronephrosis. Urinary bladder is unremarkable in appearance. Stomach/Bowel: The appearance of the stomach is unremarkable. No pathologic dilatation of small bowel or colon. Normal appendix. Vascular/Lymphatic: Atherosclerosis in the abdominal aorta and pelvic vasculature, without evidence of aneurysm or dissection. Moderate to severe stenosis at the origin of the right  superficial femoral artery. No lymphadenopathy noted in the abdomen or pelvis. Reproductive: Prostate gland and seminal vesicles are unremarkable in appearance. Other: 7 mm soft tissue attenuation nodule in the right retroperitoneum (axial image 82 of series 2) decreased in size compared to the prior study (previously 13 mm and hypermetabolic on prior PET-CT), indicating a positive response to therapy. No significant volume of ascites. No pneumoperitoneum. Musculoskeletal: Lytic lesion centered in the right-side of the L4 vertebral body measuring 2.9 x 2.3 cm, previously hypermetabolic on prior PET-CT. No new aggressive appearing lytic or blastic lesions are noted elsewhere in the visualized portions of the axial or appendicular skeleton. IMPRESSION: 1. Incidental segmental sized pulmonary embolus to the right lower lobe. 2. Today's study demonstrates a positive response to therapy in terms of regression of the primary centrally located perihilar left upper lobe pulmonary nodule, along with regression of left hilar and AP window lymphadenopathy, and soft tissue metastatic lesion to the right retroperitoneum. 3. 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 No new osseous lesions are noted. 4. Indeterminate lesion in the lower pole of the left kidney measuring only 7 mm, too small to definitively characterize. Continued attention on follow-up studies is recommended to ensure stability. 5. Aortic atherosclerosis. 6. Additional incidental findings, as above. These results will be called to the ordering clinician or representative by the Radiologist Assistant, and communication documented in the PACS or [employer]. Electronically Signed   By: Trudie Reed M.D.   On: 08/06/2022 09:53     ASSESSMENT/PLAN:  This is a very pleasant 67 year old African-American male with stage IV (T2a, N2, M1 B non-small cell lung cancer, squamous cell carcinoma.  He presented with a left upper lobe  lung mass in addition to AP window lymphadenopathy and suspicious for metastases to T8 and L4 vertebrae as well as right retroperitoneal metastatic nodule.  He was diagnosed in April 2024.  His molecular studies by [id number] showed no actionable mutation.  His PD-L1 expression is 70%.  He underwent palliative radiation to the metastatic bone lesions.  He also receives [place] every 6 weeks.  He is currently undergoing palliative systemic chemotherapy and immunotherapy with carboplatin for AUC of 5, paclitaxel 175 mg/m, and immunotherapy with Libtayo 350 mg IV every 3 weeks with Neulasta support.  He is status post 3 cycles.  He is tolerating it well thus far without any major concerning adverse side effects.  The patient recently had a restaging CT scan performed.  The patient was seen with Dr. Arbutus Ped today.  Dr. Arbutus Ped personally and independently reviewed the scan  and discussed results with the patient today.  The scan showed positive response to treatment but showed the incidental pulmonary embolism for which he is currently taking Eliquis.  Discussed with the patient that Eliquis will be a lifelong medication.  I have sent his maintenance doses to the pharmacy.  He knows not to take this daily completes the starter pack.  He mentions Eliquis is expensive.  Encouraged him to fill out the drug assistance application online.  Educated the patient to avoid NSAIDs while taking Eliquis.  Dr. Arbutus Ped recommends that he continue on the same treatment at the same dose.  He will proceed with cycle #4 today scheduled.  Explained to the patient that starting from cycle #5, the patient will start maintenance treatment with Libtayo which he will no longer need G-CSF injections which causes bone pain.  He also will no longer need to come in for labs on a weekly basis.  Will see him back for follow-up visit in 3 weeks for evaluation repeat blood work before undergoing cycle #5, which will start his maintenance  treatment with immunotherapy.  He will continue to follow with palliative care and he is scheduled to see them today.   He will continue taking his iron supplement for anemia.   Discussed constipation education with the patient.  Advised to titrate up his stool softeners in order to achieve a bowel movement every day or every other day.  If he has not had a bowel movement 2 or more days and has normal bowel habits, he is advised to take a laxative.  Patient reports good appetite and he was surprised to see that he lost weight.  We will monitor this closely.  He was encouraged to drink 1-2 supplemental protein drinks per day to maintain his weight.  The patient was advised to call immediately if she has any concerning symptoms in the interval. The patient voices understanding of current disease status and treatment options and is in agreement with the current care plan. All questions were answered. The patient knows to call the clinic with any problems, questions or concerns. We can certainly see the patient much sooner if necessary   No orders of the defined types were placed in this encounter.    Kemper Heupel L Janifer Gieselman, PA-C 08/18/22  ADDENDUM: Hematology/Oncology Attending: I had a face-to-face encounter with the patient today.  I reviewed his record, lab, scan and recommended his care plan.  This is a very pleasant 67 years old African-American male with stage IV non-small cell lung cancer, squamous cell carcinoma diagnosed in April 2024 with PD-L1 expression of 70%, status post palliative radiotherapy to the metastatic disease at T8 and L4.  The patient started systemic chemotherapy with carboplatin, paclitaxel and Libtayo (Cempilimab) status post 3 cycles.  He has been tolerating this treatment well except for the fatigue and aching pain after the Neulasta injection. He had repeat CT scan of the chest, abdomen and pelvis performed recently.  I personally and independently reviewed the  scan and discussed the result with the patient and his wife. His scan showed incidental segmental size pulmonary embolus to the right lower lobe and the patient started treatment with Eliquis.  The scan also showed positive response to therapy with regression of the primary centrally located perihilar left upper lobe pulmonary nodule as well as the left hilar and AP window lymphadenopathy and soft tissue lesion of the right retroperitoneum. I recommended for the patient to proceed with cycle #4 of his treatment with  chemotherapy today. Starting from cycle #5 he will be on maintenance treatment with single agent Libtayo (Cempilimab) every 3 weeks. The patient will come back for follow-up visit in 3 weeks for evaluation before the next cycle of his treatment. He was advised to call immediately if he has any other concerning symptoms in the interval. The total time spent in the appointment was 30 minutes. Disclaimer: This note was dictated with voice recognition software. Similar sounding words can inadvertently be transcribed and may be missed upon review. Lajuana Matte, MD

## 2022-08-16 NOTE — Progress Notes (Signed)
Radiation Oncology         [phone]) [phone] ________________________________  Name: Albert Hardy. MRN: [id number]  Date: 08/17/2022  DOB: [birth year]  Follow-Up Visit Note  CC: Albert Flood, MD  Albert Flood, MD  No diagnosis found.  Diagnosis: The encounter diagnosis was Squamous cell carcinoma of lung, stage IV, left (HCC).   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    Cancer Staging  Squamous cell carcinoma of lung, stage IV, left (HCC) Staging form: Lung, AJCC 8th Edition - Clinical: Stage IVA (cT2a, cN2, cM1b) - Signed by Si Gaul, MD on 05/29/2022  Interval Since Last Radiation: 1 month and 7 days   Indication for treatment: palliative       Radiation treatment dates: 06/25/22 through 07/10/22 Site/dose:  1) lumbar spine - 30 Gy delivered in 10 Fx at 3 Gy/Fx 2) thoracic spine - 30 Gy delivered in 10 Fx at 3 Gy/Fx Beams/energy: 15X, 10X Technique/Mode: 3D / Photon   Narrative:  The patient returns today for routine follow-up. The patient tolerated radiation treatment relatively well. During his final weekly treatment check on 07/07/22, the patient endorsed fatigue which he noted as very mild and manageable. He denied any other side effects or concerns with radiation treatment, and reported achieving an improvement in his pain with treatment.   The patient is now on chemotherapy consisting of Libtayo, Paxlitaxel, and Carboplatin. He is now s/p 3 cycles of treatment (given on 07/28/22). He began his first cycle on 06/17/22. He is also receiving Xgeva every 6 weeks for his bone metastases. Chemotherapy will be followed by maintenance treatment with Libtayo  (Cempilimab) if the patient has no evidence of disease progression after cycle #4.   During his most recent follow-up visit with Dr. Arbutus Ped on 07/28/22, the patient endorsed some odynophagia after completing palliative radiotherapy to the metastatic bone disease at the thoracic spine. This continues to improve with pain medication and Carafate. He otherwise denies any respiratory symptoms or other concerns from radiation or systemic therapy.   He recently presented for a restaging chest CT with contrast on 08/06/22 which showed a positive response to therapy, demonstrated by: regression of the primary centrally located perihilar left upper lobe pulmonary nodule, regression of left hilar and AP window lymphadenopathy, and regression of the soft tissue metastatic lesion to the right retroperitoneum. No change is seen pertaining to the lytic osseous metastases in T8 and L4. CT also showed an indeterminate lesion in the lower pole of the left kidney measuring 7 mm, and an incidentally noted segmental sized pulmonary embolus to the right lower lobe. CT otherwise showed no new osseous lesions.   ***                               Allergies:  is allergic to lipitor [atorvastatin] and pravastatin.  Meds: Current Outpatient Medications  Medication Sig Dispense Refill   APIXABAN (ELIQUIS) VTE STARTER PACK (10MG  AND 5MG ) Take as directed on package: start with two-5mg  tablets twice daily for 7 days. On day 8, switch to one-5mg  tablet twice daily. 74 each 0   sucralfate (CARAFATE) 1 g tablet Take 1 tablet (  1 g total) by mouth 4 (four) times daily -  with meals and at bedtime. Crush and dissolve in 10 mL of warm water prior to swallowing, take 20 to 30 minutes before meals 60 tablet 1   albuterol (VENTOLIN HFA) 108 (90 Base) MCG/ACT inhaler Inhale 2 puffs into the lungs every 6 (six) hours as needed for wheezing or shortness of breath. 8 g 6   Ascorbic Acid (VITAMIN C PO) Take 1 tablet by mouth daily.      Cholecalciferol (VITAMIN D3) 1000 UNITS CAPS Take 1,000 Units by mouth daily.     Coenzyme Q10 (CO Q 10 PO) Take 1 tablet by mouth daily.     Cyanocobalamin (VITAMIN B-12 PO) Take 1 tablet by mouth daily.     cyclobenzaprine (FLEXERIL) 5 MG tablet Take 1 tablet (5 mg total) by mouth 3 (three) times daily as needed for muscle spasms. 30 tablet 0   docusate sodium (COLACE) 100 MG capsule Take 1 capsule (100 mg total) by mouth every 12 (twelve) hours. 60 capsule 0   ezetimibe (ZETIA) 10 MG tablet Take 1 tablet (10 mg total) by mouth daily. 90 tablet 3   fluticasone (FLONASE) 50 MCG/ACT nasal spray Place 1-2 sprays into both nostrils daily. (Patient taking differently: Place 1-2 sprays into both nostrils daily as needed for allergies.) 16 g 6   ibuprofen (ADVIL) 200 MG tablet Take 400 mg by mouth every 6 (six) hours as needed for moderate pain.     KRILL OIL PO Take 1 tablet by mouth daily.     lisinopril-hydrochlorothiazide (ZESTORETIC) 20-12.5 MG tablet Take 2 tablets by mouth once daily 180 tablet 0   MAGNESIUM PO Take 1 tablet by mouth daily.     metoCLOPramide (REGLAN) 10 MG tablet Take 1 tablet (10 mg total) by mouth 4 (four) times daily -  before meals and at bedtime. 56 tablet 0   morphine (MS CONTIN) 15 MG 12 hr tablet Take 1 tablet (15 mg total) by mouth every 12 (twelve) hours. 60 tablet 0   Multiple Vitamins-Minerals (CENTRUM SILVER PO) Take 1 tablet by mouth daily.     Multiple Vitamins-Minerals (ZINC PO) Take 1 tablet by mouth daily.     Naphazoline-Pheniramine (OPCON-A) 0.027-0.315 % SOLN Place 1 drop into both eyes daily as needed (redness).     nicotine (NICODERM CQ - DOSED IN MG/24 HOURS) 14 mg/24hr patch 14mg /day for 6 weeks then 7mg /day x 2 weeks 28 patch 1   omeprazole (PRILOSEC) 20 MG capsule Take 1 capsule by mouth once daily 30 capsule 0   oxyCODONE (OXY IR/ROXICODONE) 5 MG immediate release tablet Take 1 tablet (5 mg total) by mouth every 4 (four) hours as needed for severe  pain. 90 tablet 0   prochlorperazine (COMPAZINE) 10 MG tablet Take 1 tablet (10 mg total) by mouth every 6 (six) hours as needed for nausea or vomiting. 30 tablet 0   rosuvastatin (CRESTOR) 5 MG tablet TAKE 1 TABLET BY MOUTH AT BEDTIME 30 tablet 0   TURMERIC PO Take 1 tablet by mouth 3 (three) times a week.     umeclidinium-vilanterol (ANORO ELLIPTA) 62.5-25 MCG/ACT AEPB Inhale 1 puff into the lungs daily. (Patient taking differently: Inhale 1 puff into the lungs daily as needed (shortness of breath).) 180 each 3   No current facility-administered medications for this encounter.    Physical Findings: The patient is in no acute distress. Patient is alert and oriented.  vitals were not taken for this visit. Marland Kitchen  No significant changes. Lungs are clear to auscultation bilaterally. Heart has regular rate and rhythm. No palpable cervical, supraclavicular, or axillary adenopathy. Abdomen soft, non-tender, normal bowel sounds.   Lab Findings: Lab Results  Component Value Date   WBC 4.2 07/28/2022   HGB 9.3 (L) 07/28/2022   HCT 28.3 (L) 07/28/2022   MCV 89.0 07/28/2022   PLT 193 07/28/2022    Radiographic Findings: CT Chest W Contrast  Result Date: 08/06/2022 CLINICAL DATA:  67 year old male with history of non-small cell lung cancer. * Tracking Code: BO * EXAM: CT CHEST, ABDOMEN, AND PELVIS WITH CONTRAST TECHNIQUE: Multidetector CT imaging of the chest, abdomen and pelvis was performed following the standard protocol during bolus administration of intravenous contrast. RADIATION DOSE REDUCTION: This exam was performed according to the departmental dose-optimization program which includes automated exposure control, adjustment of the mA and/or kV according to patient size and/or use of iterative reconstruction technique. CONTRAST:  OMNIPAQUE IOHEXOL 300 MG/ML  SOLN COMPARISON:  Multiple priors, most recently PET-CT 06/04/2022. FINDINGS: CT CHEST FINDINGS Cardiovascular: In a segmental sized  pulmonary artery branch to the right lower lobe there is a small filling defect (axial image 38 of series 2), concerning for pulmonary embolism. Heart size is normal. There is no significant pericardial fluid, thickening or pericardial calcification. Aortic atherosclerosis. No definite coronary artery calcifications. Mediastinum/Nodes: No pathologically enlarged mediastinal or hilar lymph nodes. Prominent amorphous soft tissue in the left hilar region likely reflects treated lymphadenopathy, measuring up to 7 mm in thickness (axial image 27 of series 2). Likewise, previously noted hypermetabolic AP window lymphadenopathy has substantially regressed now measuring only 7 mm in short axis indicating a positive response to therapy. Esophagus is unremarkable in appearance. No axillary lymphadenopathy. Lungs/Pleura: Regression of previously noted perihilar pulmonary nodule, with some residual soft tissue thickening in the perihilar aspect of the left upper lobe, indicating a positive response to therapy. 4 mm pulmonary nodule in the base of the right lower lobe (axial image 130 of series 6), unchanged. No other definite new suspicious appearing pulmonary nodules or masses are noted. No acute consolidative airspace disease. No pleural effusions. Musculoskeletal: Large lytic lesion in the posterior aspect of the left side of T8 vertebral body extending into the posterior elements measuring approximately 2.9 x 1.9 cm, similar to prior PET-CT. No other new aggressive appearing lytic or blastic lesions are confidently identified elsewhere in the visualized axial or appendicular skeleton. CT ABDOMEN PELVIS FINDINGS Hepatobiliary: No suspicious cystic or solid hepatic lesions. No intra or extrahepatic biliary ductal dilatation. Gallbladder is normal in appearance. Pancreas: No pancreatic mass. No pancreatic ductal dilatation. No pancreatic or peripancreatic fluid collections or inflammatory changes. Spleen: Unremarkable.  Adrenals/Urinary Tract: 7 mm exophytic lesion in the lower pole of the left kidney posteriorly (axial image 70 of series 2) is too small to characterize. Right kidney and bilateral adrenal glands are otherwise normal in appearance. No hydroureteronephrosis. Urinary bladder is unremarkable in appearance. Stomach/Bowel: The appearance of the stomach is unremarkable. No pathologic dilatation of small bowel or colon. Normal appendix. Vascular/Lymphatic: Atherosclerosis in the abdominal aorta and pelvic vasculature, without evidence of aneurysm or dissection. Moderate to severe stenosis at the origin of the right superficial femoral artery. No lymphadenopathy noted in the abdomen or pelvis. Reproductive: Prostate gland and seminal vesicles are unremarkable in appearance. Other: 7 mm soft tissue attenuation nodule in the right retroperitoneum (axial image 82 of series 2) decreased in size compared to the prior study (previously 13 mm and hypermetabolic  on prior PET-CT), indicating a positive response to therapy. No significant volume of ascites. No pneumoperitoneum. Musculoskeletal: Lytic lesion centered in the right-side of the L4 vertebral body measuring 2.9 x 2.3 cm, previously hypermetabolic on prior PET-CT. No new aggressive appearing lytic or blastic lesions are noted elsewhere in the visualized portions of the axial or appendicular skeleton. IMPRESSION: 1. Incidental segmental sized pulmonary embolus to the right lower lobe. 2. Today's study demonstrates a positive response to therapy in terms of regression of the primary centrally located perihilar left upper lobe pulmonary nodule, along with regression of left hilar and AP window lymphadenopathy, and soft tissue metastatic lesion to the right retroperitoneum. 3. 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 No new osseous lesions are noted. 4. Indeterminate lesion in the lower pole of the left kidney measuring only 7 mm, too small to  definitively characterize. Continued attention on follow-up studies is recommended to ensure stability. 5. Aortic atherosclerosis. 6. Additional incidental findings, as above. These results will be called to the ordering clinician or representative by the Radiologist Assistant, and communication documented in the PACS or [employer]. Electronically Signed   By: Trudie Reed M.D.   On: 08/06/2022 09:53   CT Abdomen Pelvis W Contrast  Result Date: 08/06/2022 CLINICAL DATA:  67 year old male with history of non-small cell lung cancer. * Tracking Code: BO * EXAM: CT CHEST, ABDOMEN, AND PELVIS WITH CONTRAST TECHNIQUE: Multidetector CT imaging of the chest, abdomen and pelvis was performed following the standard protocol during bolus administration of intravenous contrast. RADIATION DOSE REDUCTION: This exam was performed according to the departmental dose-optimization program which includes automated exposure control, adjustment of the mA and/or kV according to patient size and/or use of iterative reconstruction technique. CONTRAST:  OMNIPAQUE IOHEXOL 300 MG/ML  SOLN COMPARISON:  Multiple priors, most recently PET-CT 06/04/2022. FINDINGS: CT CHEST FINDINGS Cardiovascular: In a segmental sized pulmonary artery branch to the right lower lobe there is a small filling defect (axial image 38 of series 2), concerning for pulmonary embolism. Heart size is normal. There is no significant pericardial fluid, thickening or pericardial calcification. Aortic atherosclerosis. No definite coronary artery calcifications. Mediastinum/Nodes: No pathologically enlarged mediastinal or hilar lymph nodes. Prominent amorphous soft tissue in the left hilar region likely reflects treated lymphadenopathy, measuring up to 7 mm in thickness (axial image 27 of series 2). Likewise, previously noted hypermetabolic AP window lymphadenopathy has substantially regressed now measuring only 7 mm in short axis indicating a positive  response to therapy. Esophagus is unremarkable in appearance. No axillary lymphadenopathy. Lungs/Pleura: Regression of previously noted perihilar pulmonary nodule, with some residual soft tissue thickening in the perihilar aspect of the left upper lobe, indicating a positive response to therapy. 4 mm pulmonary nodule in the base of the right lower lobe (axial image 130 of series 6), unchanged. No other definite new suspicious appearing pulmonary nodules or masses are noted. No acute consolidative airspace disease. No pleural effusions. Musculoskeletal: Large lytic lesion in the posterior aspect of the left side of T8 vertebral body extending into the posterior elements measuring approximately 2.9 x 1.9 cm, similar to prior PET-CT. No other new aggressive appearing lytic or blastic lesions are confidently identified elsewhere in the visualized axial or appendicular skeleton. CT ABDOMEN PELVIS FINDINGS Hepatobiliary: No suspicious cystic or solid hepatic lesions. No intra or extrahepatic biliary ductal dilatation. Gallbladder is normal in appearance. Pancreas: No pancreatic mass. No pancreatic ductal dilatation. No pancreatic or peripancreatic fluid collections  or inflammatory changes. Spleen: Unremarkable. Adrenals/Urinary Tract: 7 mm exophytic lesion in the lower pole of the left kidney posteriorly (axial image 70 of series 2) is too small to characterize. Right kidney and bilateral adrenal glands are otherwise normal in appearance. No hydroureteronephrosis. Urinary bladder is unremarkable in appearance. Stomach/Bowel: The appearance of the stomach is unremarkable. No pathologic dilatation of small bowel or colon. Normal appendix. Vascular/Lymphatic: Atherosclerosis in the abdominal aorta and pelvic vasculature, without evidence of aneurysm or dissection. Moderate to severe stenosis at the origin of the right superficial femoral artery. No lymphadenopathy noted in the abdomen or pelvis. Reproductive: Prostate gland  and seminal vesicles are unremarkable in appearance. Other: 7 mm soft tissue attenuation nodule in the right retroperitoneum (axial image 82 of series 2) decreased in size compared to the prior study (previously 13 mm and hypermetabolic on prior PET-CT), indicating a positive response to therapy. No significant volume of ascites. No pneumoperitoneum. Musculoskeletal: Lytic lesion centered in the right-side of the L4 vertebral body measuring 2.9 x 2.3 cm, previously hypermetabolic on prior PET-CT. No new aggressive appearing lytic or blastic lesions are noted elsewhere in the visualized portions of the axial or appendicular skeleton. IMPRESSION: 1. Incidental segmental sized pulmonary embolus to the right lower lobe. 2. Today's study demonstrates a positive response to therapy in terms of regression of the primary centrally located perihilar left upper lobe pulmonary nodule, along with regression of left hilar and AP window lymphadenopathy, and soft tissue metastatic lesion to the right retroperitoneum. 3. 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 No new osseous lesions are noted. 4. Indeterminate lesion in the lower pole of the left kidney measuring only 7 mm, too small to definitively characterize. Continued attention on follow-up studies is recommended to ensure stability. 5. Aortic atherosclerosis. 6. Additional incidental findings, as above. These results will be called to the ordering clinician or representative by the Radiologist Assistant, and communication documented in the PACS or [employer]. Electronically Signed   By: Trudie Reed M.D.   On: 08/06/2022 09:53    Impression: The encounter diagnosis was Squamous cell carcinoma of lung, stage IV, left (HCC).   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   The patient is recovering from the effects of radiation.  ***  Plan:  ***   *** minutes of total time was spent for this patient encounter, including preparation, face-to-face counseling with the patient and coordination of care, physical exam, and documentation of the encounter. ____________________________________  Billie Lade, PhD, MD  This document serves as a record of services personally performed by Antony Blackbird, MD. It was created on his behalf by Neena Rhymes, a trained medical scribe. The creation of this record is based on the scribe's personal observations and the provider's statements to them. This document has been checked and approved by the attending provider.

## 2022-08-17 ENCOUNTER — Other Ambulatory Visit: Payer: Self-pay

## 2022-08-17 ENCOUNTER — Ambulatory Visit
Admission: RE | Admit: 2022-08-17 | Discharge: 2022-08-17 | Disposition: A | Discharge status: Home / Self Care | Payer: Medicare Other | Source: Ambulatory Visit | Attending: Radiation Oncology | Admitting: Radiation Oncology

## 2022-08-17 ENCOUNTER — Encounter: Payer: Self-pay | Admitting: Radiation Oncology

## 2022-08-17 ENCOUNTER — Other Ambulatory Visit: Payer: Self-pay | Admitting: Family Medicine

## 2022-08-17 VITALS — BP 107/65 | HR 78 | Resp 18 | Wt 147.5 lb

## 2022-08-17 DIAGNOSIS — C3412 Malignant neoplasm of upper lobe, left bronchus or lung: Secondary | ICD-10-CM | POA: Diagnosis not present

## 2022-08-17 DIAGNOSIS — C7951 Secondary malignant neoplasm of bone: Secondary | ICD-10-CM | POA: Insufficient documentation

## 2022-08-17 DIAGNOSIS — R590 Localized enlarged lymph nodes: Secondary | ICD-10-CM | POA: Diagnosis not present

## 2022-08-17 DIAGNOSIS — K219 Gastro-esophageal reflux disease without esophagitis: Secondary | ICD-10-CM

## 2022-08-17 DIAGNOSIS — R1310 Dysphagia, unspecified: Secondary | ICD-10-CM | POA: Diagnosis not present

## 2022-08-17 DIAGNOSIS — Z7901 Long term (current) use of anticoagulants: Secondary | ICD-10-CM | POA: Diagnosis not present

## 2022-08-17 DIAGNOSIS — G893 Neoplasm related pain (acute) (chronic): Secondary | ICD-10-CM | POA: Insufficient documentation

## 2022-08-17 DIAGNOSIS — C7802 Secondary malignant neoplasm of left lung: Secondary | ICD-10-CM | POA: Insufficient documentation

## 2022-08-17 DIAGNOSIS — I2699 Other pulmonary embolism without acute cor pulmonale: Secondary | ICD-10-CM | POA: Diagnosis not present

## 2022-08-17 DIAGNOSIS — E785 Hyperlipidemia, unspecified: Secondary | ICD-10-CM

## 2022-08-17 DIAGNOSIS — I700 Atherosclerosis of aorta: Secondary | ICD-10-CM | POA: Diagnosis not present

## 2022-08-17 DIAGNOSIS — Z79899 Other long term (current) drug therapy: Secondary | ICD-10-CM | POA: Diagnosis not present

## 2022-08-17 DIAGNOSIS — C3492 Malignant neoplasm of unspecified part of left bronchus or lung: Secondary | ICD-10-CM

## 2022-08-17 HISTORY — DX: Personal history of irradiation: Z92.3

## 2022-08-17 MED ORDER — ROSUVASTATIN CALCIUM 5 MG PO TABS
5.0000 mg | ORAL_TABLET | Freq: Every day | ORAL | 0 refills | Status: DC
Start: 2022-08-17 — End: 2022-09-02

## 2022-08-17 MED ORDER — OMEPRAZOLE 20 MG PO CPDR
20.0000 mg | DELAYED_RELEASE_CAPSULE | Freq: Every day | ORAL | 0 refills | Status: DC
Start: 2022-08-17 — End: 2022-09-02

## 2022-08-17 MED FILL — Dexamethasone Sodium Phosphate Inj 100 MG/10ML: INTRAMUSCULAR | Qty: 1 | Status: AC

## 2022-08-17 MED FILL — Fosaprepitant Dimeglumine For IV Infusion 150 MG (Base Eq): INTRAVENOUS | Qty: 5 | Status: AC

## 2022-08-17 NOTE — Progress Notes (Signed)
Albert Council. is here today for follow up post radiation to the  L -spine and T- spine  They completed their radiation on: 06/25/22 to   07-10-22   Does the patient complain of any of the following:  Pain:None Fatigue  Mild fatigue Diarrhea/Constipation: States that he has some constipation. States that he is going to get a stool softener. Nausea/Vomiting: denies Urinary Issues (dysuria/incomplete emptying/ incontinence/ increased frequency/urgency): States that it takes a while to start his stream Post radiation skin changes: denies any issues.   Additional comments if applicable:   Vitals:   08/17/22 1507  BP: 107/65  Pulse: 78  Resp: 18  SpO2: 100%  Weight: 66.9 kg

## 2022-08-17 NOTE — Progress Notes (Unsigned)
Palliative Medicine [facility]  [phone] [phone]   Name: Albert Hardy. Date: 08/17/2022 MRN: [id number]  DOB: [birth year]  Patient Care Team: [name], MD as PCP - General (Family Medicine)    INTERVAL HISTORY: Albert Hardy. is a 67 y.o. 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 Palliative ask to see for symptom management and goals of care   SOCIAL HISTORY:     reports that he quit smoking about 10 years ago. His smoking use included cigarettes. He has a 30.00 pack-year smoking history. He has never used smokeless tobacco. He reports current alcohol use. He reports current drug use. Frequency: 1.00 time per week. Drug: Marijuana.  ADVANCE DIRECTIVES:  None on file  CODE STATUS: Full code  PAST MEDICAL HISTORY: Past Medical History:  Diagnosis Date   Aortic regurgitation    Aortic stenosis 04/04/2018   Atherosclerosis of native arteries of the extremities with intermittent claudication 09/08/2013   Bilateral impacted cerumen 10/13/2018   Bilateral sensorineural hearing loss 12/14/2018   Dyspnea    Former moderate cigarette smoker (10-19 per day) 05/20/2012   Quit smoking Nov 2013 when hospitalized w/ HTN and chest pain(diagnosed w/ valvular heart disease)   GERD (gastroesophageal reflux disease)    Heart murmur    History of radiation therapy    Lumbar Spine, Thoracic Spine- 06/25/22-07/10/22- Dr. 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  MEDICATIONS:  Current Outpatient Medications  Medication Sig Dispense Refill   APIXABAN (ELIQUIS) VTE STARTER PACK (10MG  AND 5MG ) Take as directed on package:  start with two-5mg  tablets twice daily for 7 days. On day 8, switch to one-5mg  tablet twice daily. 74 each 0   sucralfate (CARAFATE) 1 g tablet Take 1 tablet (1 g total) by mouth 4 (four) times daily -  with meals and at bedtime. Crush and dissolve in 10 mL of warm water prior to swallowing, take 20 to 30 minutes before meals 60 tablet 1   albuterol (VENTOLIN HFA) 108 (90 Base) MCG/ACT inhaler Inhale 2 puffs into the lungs every 6 (six) hours as needed for wheezing or shortness of breath. 8 g 6   Ascorbic Acid (VITAMIN C PO) Take 1 tablet by mouth daily.     Cholecalciferol (VITAMIN D3) 1000 UNITS CAPS Take 1,000 Units by mouth daily.     Coenzyme Q10 (CO Q 10 PO) Take 1 tablet by mouth daily.     Cyanocobalamin (VITAMIN B-12 PO) Take 1 tablet by mouth daily.     cyclobenzaprine (FLEXERIL) 5 MG tablet Take 1 tablet (5 mg total) by mouth 3 (three) times daily as needed for muscle spasms. 30 tablet 0   docusate sodium (COLACE) 100 MG capsule Take 1 capsule (100 mg total) by mouth every 12 (twelve) hours. 60 capsule 0   ezetimibe (ZETIA) 10 MG tablet Take 1 tablet (10 mg total) by mouth daily. 90 tablet 3   fluticasone (FLONASE) 50 MCG/ACT nasal spray Place 1-2 sprays into both nostrils daily. (Patient taking differently: Place 1-2 sprays into both nostrils daily as needed for allergies.) 16 g 6   ibuprofen (ADVIL) 200 MG tablet Take  400 mg by mouth every 6 (six) hours as needed for moderate pain.     KRILL OIL PO Take 1 tablet by mouth daily.     lisinopril-hydrochlorothiazide (ZESTORETIC) 20-12.5 MG tablet Take 2 tablets by mouth once daily 180 tablet 0   MAGNESIUM PO Take 1 tablet by mouth daily.     metoCLOPramide (REGLAN) 10 MG tablet Take 1 tablet (10 mg total) by mouth 4 (four) times daily -  before meals and at bedtime. 56 tablet 0   morphine (MS CONTIN) 15 MG 12 hr tablet Take 1 tablet (15 mg total) by mouth every 12 (twelve) hours. 60 tablet 0   Multiple Vitamins-Minerals (CENTRUM SILVER PO)  Take 1 tablet by mouth daily.     Multiple Vitamins-Minerals (ZINC PO) Take 1 tablet by mouth daily.     Naphazoline-Pheniramine (OPCON-A) 0.027-0.315 % SOLN Place 1 drop into both eyes daily as needed (redness).     nicotine (NICODERM CQ - DOSED IN MG/24 HOURS) 14 mg/24hr patch 14mg /day for 6 weeks then 7mg /day x 2 weeks 28 patch 1   omeprazole (PRILOSEC) 20 MG capsule Take 1 capsule (20 mg total) by mouth daily. 30 capsule 0   oxyCODONE (OXY IR/ROXICODONE) 5 MG immediate release tablet Take 1 tablet (5 mg total) by mouth every 4 (four) hours as needed for severe pain. 90 tablet 0   prochlorperazine (COMPAZINE) 10 MG tablet Take 1 tablet (10 mg total) by mouth every 6 (six) hours as needed for nausea or vomiting. 30 tablet 0   rosuvastatin (CRESTOR) 5 MG tablet Take 1 tablet (5 mg total) by mouth at bedtime. 30 tablet 0   TURMERIC PO Take 1 tablet by mouth 3 (three) times a week.     umeclidinium-vilanterol (ANORO ELLIPTA) 62.5-25 MCG/ACT AEPB Inhale 1 puff into the lungs daily. (Patient taking differently: Inhale 1 puff into the lungs daily as needed (shortness of breath).) 180 each 3   No current facility-administered medications for this visit.    VITAL SIGNS: There were no vitals taken for this visit. There were no vitals filed for this visit.  Estimated body mass index is 22.51 kg/m as calculated from the following:   Height as of 07/28/22: 5\' 9"  (1.753 m).   Weight as of 07/28/22: 152 lb 6.4 oz (69.1 kg).     Latest Ref Rng & Units 07/28/2022    7:55 AM 07/20/2022    2:40 PM 07/13/2022    1:15 PM  CBC  WBC 4.0 - 10.5 K/uL 4.2  7.6  13.7   Hemoglobin 13.0 - 17.0 g/dL 9.3  56.2  13.0   Hematocrit 39.0 - 52.0 % 28.3  33.2  37.0   Platelets 150 - 400 K/uL 193  178  163        Latest Ref Rng & Units 07/28/2022    7:55 AM 07/20/2022    2:40 PM 07/13/2022    1:15 PM  CMP  Glucose 70 - 99 mg/dL 865  784  696   BUN 8 - 23 mg/dL 28  27  28    Creatinine 0.61 - 1.24 mg/dL 2.95  2.84  1.32    Sodium 135 - 145 mmol/L 140  130  134   Potassium 3.5 - 5.1 mmol/L 4.1  4.3  4.1   Chloride 98 - 111 mmol/L 107  96  94   CO2 22 - 32 mmol/L 25  23  26    Calcium 8.9 - 10.3 mg/dL 9.5  9.4  9.1  Total Protein 6.5 - 8.1 g/dL 6.2  8.0  8.4   Total Bilirubin 0.3 - 1.2 mg/dL 0.2  0.4  0.5   Alkaline Phos 38 - 126 U/L 64  91  112   AST 15 - 41 U/L 18  21  25    ALT 0 - 44 U/L 24  24  34     PERFORMANCE STATUS (ECOG) : 1 - Symptomatic but completely ambulatory   Physical Exam General: weak appearing  Cardiovascular: Tachycardic Pulmonary: normal breathing pattern  Extremities: no edema, no joint deformities Skin: no rashes Neurological: AAO x4  IMPRESSION:   Neoplasm related pain Mr. Ferroni reports his pain is well-controlled on current regimen.  Some days are better than others.     We discussed his regimen at length. He is currently taking MS Contin 15 mg every 12 hours, Oxy IR 5mg  every 4-6  hours as needed for breakthrough pain.  Flexeril as needed for spasms.    No changes to current regimen at this time.  Will continue to closely monitor.   Constipation   Decreased appetite/Weight loss    Goals of Care   06/18/22-  We discussed his current illness and what it means in the larger context of hsi on-going co-morbidities. Natural disease trajectory and expectations were discussed.  Mr. Scovill and his wife are realistic in their expectations and understanding of his incurable cancer. He knows all treatment is palliative focused. Wishes to continue taking things one day at a time focusing on his quality of life allowing him every opportunity to continue to thrive.   We discussed Her current illness and what it means in the larger context of Her on-going co-morbidities. Natural disease trajectory and expectations were discussed.  I discussed the importance of continued conversation with family and their medical providers regarding overall plan of care and treatment options,  ensuring decisions are within the context of the patients values and GOCs.  PLAN:  Increase Pepcid 20 mg to twice daily 1 L IV fluids 20 mg IV Pepcid 10 mg IV Reglan Dexamethasone 10 mg  8 mg IV Zofran Reglan 10 mg 4 times daily x 14 days Oxy IR 5mg  every 4-6 hours as needed for breakthrough pain  MS Contin 15 mg every 12 hours.  Tolerating well. Flexeril as needed Pain contract on file I will plan to see patient back in 3-4 weeks in collaboration to other oncology appointments.  Will give him a call in the next 2 to 3 days for close symptom follow-up.  Patient and wife knows to contact clinic if no improvement.   Patient expressed understanding and was in agreement with this plan. He also understands that He can call the clinic at any time with any questions, concerns, or complaints.   Any controlled substances utilized were prescribed in the context of palliative care. PDMP has been reviewed.    Visit consisted of counseling and education dealing with the complex and emotionally intense issues of symptom management and palliative care in the setting of serious and potentially life-threatening illness.Greater than 50%  of this time was spent counseling and coordinating care related to the above assessment and plan.  Willette [facility], AGPCNP-BC  Palliative Medicine Team/[facility]  *Please note that this is a verbal dictation therefore any spelling or grammatical errors are due to the "[employer]" system interpretation.

## 2022-08-17 NOTE — Telephone Encounter (Signed)
Left pt a  VM stating he needs an apt / Last ov was 06/11/21. I stated we could only give 30 days with no refills and no more refills till he is seen in office .

## 2022-08-18 ENCOUNTER — Other Ambulatory Visit: Payer: Self-pay

## 2022-08-18 ENCOUNTER — Encounter: Payer: Self-pay | Admitting: Nurse Practitioner

## 2022-08-18 ENCOUNTER — Inpatient Hospital Stay (HOSPITAL_BASED_OUTPATIENT_CLINIC_OR_DEPARTMENT_OTHER): Payer: Medicare Other | Admitting: Nurse Practitioner

## 2022-08-18 ENCOUNTER — Inpatient Hospital Stay: Payer: Medicare Other

## 2022-08-18 ENCOUNTER — Inpatient Hospital Stay (HOSPITAL_BASED_OUTPATIENT_CLINIC_OR_DEPARTMENT_OTHER): Payer: Medicare Other | Admitting: Physician Assistant

## 2022-08-18 ENCOUNTER — Inpatient Hospital Stay: Payer: Medicare Other | Attending: Internal Medicine

## 2022-08-18 VITALS — BP 115/55 | HR 72 | Temp 98.2°F | Resp 13 | Wt 146.9 lb

## 2022-08-18 VITALS — BP 132/59 | HR 62 | Resp 14

## 2022-08-18 DIAGNOSIS — Z5112 Encounter for antineoplastic immunotherapy: Secondary | ICD-10-CM | POA: Insufficient documentation

## 2022-08-18 DIAGNOSIS — R630 Anorexia: Secondary | ICD-10-CM

## 2022-08-18 DIAGNOSIS — C3492 Malignant neoplasm of unspecified part of left bronchus or lung: Secondary | ICD-10-CM

## 2022-08-18 DIAGNOSIS — C7951 Secondary malignant neoplasm of bone: Secondary | ICD-10-CM | POA: Insufficient documentation

## 2022-08-18 DIAGNOSIS — K5903 Drug induced constipation: Secondary | ICD-10-CM

## 2022-08-18 DIAGNOSIS — Z87891 Personal history of nicotine dependence: Secondary | ICD-10-CM | POA: Diagnosis not present

## 2022-08-18 DIAGNOSIS — Z79899 Other long term (current) drug therapy: Secondary | ICD-10-CM | POA: Diagnosis not present

## 2022-08-18 DIAGNOSIS — I2699 Other pulmonary embolism without acute cor pulmonale: Secondary | ICD-10-CM | POA: Diagnosis not present

## 2022-08-18 DIAGNOSIS — Z515 Encounter for palliative care: Secondary | ICD-10-CM

## 2022-08-18 DIAGNOSIS — Z5189 Encounter for other specified aftercare: Secondary | ICD-10-CM | POA: Diagnosis not present

## 2022-08-18 DIAGNOSIS — Z5111 Encounter for antineoplastic chemotherapy: Secondary | ICD-10-CM

## 2022-08-18 DIAGNOSIS — R530 Neoplastic (malignant) related fatigue: Secondary | ICD-10-CM

## 2022-08-18 DIAGNOSIS — C3412 Malignant neoplasm of upper lobe, left bronchus or lung: Secondary | ICD-10-CM | POA: Insufficient documentation

## 2022-08-18 DIAGNOSIS — G893 Neoplasm related pain (acute) (chronic): Secondary | ICD-10-CM | POA: Diagnosis not present

## 2022-08-18 DIAGNOSIS — Z79620 Long term (current) use of immunosuppressive biologic: Secondary | ICD-10-CM | POA: Diagnosis not present

## 2022-08-18 HISTORY — DX: Encounter for antineoplastic chemotherapy: Z51.11

## 2022-08-18 HISTORY — DX: Encounter for antineoplastic immunotherapy: Z51.12

## 2022-08-18 LAB — CBC WITH DIFFERENTIAL (CANCER CENTER ONLY)
Abs Immature Granulocytes: 0.02 10*3/uL (ref 0.00–0.07)
Basophils Absolute: 0 10*3/uL (ref 0.0–0.1)
Basophils Relative: 0 %
Eosinophils Absolute: 0 10*3/uL (ref 0.0–0.5)
Eosinophils Relative: 0 %
HCT: 29.7 % — ABNORMAL LOW (ref 39.0–52.0)
Hemoglobin: 9.6 g/dL — ABNORMAL LOW (ref 13.0–17.0)
Immature Granulocytes: 0 %
Lymphocytes Relative: 15 %
Lymphs Abs: 0.7 10*3/uL (ref 0.7–4.0)
MCH: 29.4 pg (ref 26.0–34.0)
MCHC: 32.3 g/dL (ref 30.0–36.0)
MCV: 91.1 fL (ref 80.0–100.0)
Monocytes Absolute: 0.7 10*3/uL (ref 0.1–1.0)
Monocytes Relative: 15 %
Neutro Abs: 3.2 10*3/uL (ref 1.7–7.7)
Neutrophils Relative %: 70 %
Platelet Count: 237 10*3/uL (ref 150–400)
RBC: 3.26 MIL/uL — ABNORMAL LOW (ref 4.22–5.81)
RDW: 17.5 % — ABNORMAL HIGH (ref 11.5–15.5)
WBC Count: 4.6 10*3/uL (ref 4.0–10.5)
nRBC: 0 % (ref 0.0–0.2)

## 2022-08-18 LAB — CMP (CANCER CENTER ONLY)
ALT: 21 U/L (ref 0–44)
AST: 15 U/L (ref 15–41)
Albumin: 3.8 g/dL (ref 3.5–5.0)
Alkaline Phosphatase: 71 U/L (ref 38–126)
Anion gap: 8 (ref 5–15)
BUN: 19 mg/dL (ref 8–23)
CO2: 27 mmol/L (ref 22–32)
Calcium: 9 mg/dL (ref 8.9–10.3)
Chloride: 105 mmol/L (ref 98–111)
Creatinine: 1.09 mg/dL (ref 0.61–1.24)
GFR, Estimated: >60 mL/min (ref >60)
Glucose, Bld: 105 mg/dL — ABNORMAL HIGH (ref 70–99)
Potassium: 3.7 mmol/L (ref 3.5–5.1)
Sodium: 140 mmol/L (ref 135–145)
Total Bilirubin: 0.3 mg/dL (ref 0.3–1.2)
Total Protein: 7.4 g/dL (ref 6.5–8.1)

## 2022-08-18 MED ORDER — SODIUM CHLORIDE 0.9 % IV SOLN
350.0000 mg | Freq: Once | INTRAVENOUS | Status: AC
  Administered 2022-08-18: 350 mg via INTRAVENOUS
  Filled 2022-08-18: qty 7

## 2022-08-18 MED ORDER — DIPHENHYDRAMINE HCL 50 MG/ML IJ SOLN
50.0000 mg | Freq: Once | INTRAMUSCULAR | Status: AC
  Administered 2022-08-18: 50 mg via INTRAVENOUS
  Filled 2022-08-18: qty 1

## 2022-08-18 MED ORDER — SODIUM CHLORIDE 0.9 % IV SOLN
150.0000 mg | Freq: Once | INTRAVENOUS | Status: AC
  Administered 2022-08-18: 150 mg via INTRAVENOUS
  Filled 2022-08-18: qty 150

## 2022-08-18 MED ORDER — SODIUM CHLORIDE 0.9 % IV SOLN
175.0000 mg/m2 | Freq: Once | INTRAVENOUS | Status: AC
  Administered 2022-08-18: 330 mg via INTRAVENOUS
  Filled 2022-08-18: qty 55

## 2022-08-18 MED ORDER — SODIUM CHLORIDE 0.9 % IV SOLN: Freq: Once | INTRAVENOUS | Status: AC

## 2022-08-18 MED ORDER — FAMOTIDINE IN NACL 20-0.9 MG/50ML-% IV SOLN
20.0000 mg | Freq: Once | INTRAVENOUS | Status: AC
  Administered 2022-08-18: 20 mg via INTRAVENOUS
  Filled 2022-08-18: qty 50

## 2022-08-18 MED ORDER — PALONOSETRON HCL INJECTION 0.25 MG/5ML
0.2500 mg | Freq: Once | INTRAVENOUS | Status: AC
  Administered 2022-08-18: 0.25 mg via INTRAVENOUS
  Filled 2022-08-18: qty 5

## 2022-08-18 MED ORDER — APIXABAN 5 MG PO TABS
5.0000 mg | ORAL_TABLET | Freq: Two times a day (BID) | ORAL | 2 refills | Status: DC
Start: 2022-08-18 — End: 2022-12-21

## 2022-08-18 MED ORDER — SODIUM CHLORIDE 0.9 % IV SOLN
10.0000 mg | Freq: Once | INTRAVENOUS | Status: AC
  Administered 2022-08-18: 10 mg via INTRAVENOUS
  Filled 2022-08-18: qty 10

## 2022-08-18 MED ORDER — SODIUM CHLORIDE 0.9 % IV SOLN
389.0000 mg | Freq: Once | INTRAVENOUS | Status: AC
  Administered 2022-08-18: 390 mg via INTRAVENOUS
  Filled 2022-08-18: qty 39

## 2022-08-18 NOTE — Patient Instructions (Signed)
[facility]  Discharge Instructions: Thank you for choosing [facility] to provide your oncology and hematology care.   If you have a lab appointment with the Cancer Center, please go directly to the Cancer Center and check in at the registration area.   Wear comfortable clothing and clothing appropriate for easy access to any Portacath or PICC line.   We strive to give you quality time with your provider. You may need to reschedule your appointment if you arrive late (15 or more minutes).  Arriving late affects you and other patients whose appointments are after yours.  Also, if you miss three or more appointments without notifying the office, you may be dismissed from the clinic at the provider's discretion.      For prescription refill requests, have your pharmacy contact our office and allow 72 hours for refills to be completed.    Today you received the following chemotherapy and/or immunotherapy agents: Cemiplimab (Libtayo), Paclitaxel (Taxol), and Carboplatin   To help prevent nausea and vomiting after your treatment, we encourage you to take your nausea medication as directed.  BELOW ARE SYMPTOMS THAT SHOULD BE REPORTED IMMEDIATELY: *FEVER GREATER THAN 100.4 F (38 C) OR HIGHER *CHILLS OR SWEATING *NAUSEA AND VOMITING THAT IS NOT CONTROLLED WITH YOUR NAUSEA MEDICATION *UNUSUAL SHORTNESS OF BREATH *UNUSUAL BRUISING OR BLEEDING *URINARY PROBLEMS (pain or burning when urinating, or frequent urination) *BOWEL PROBLEMS (unusual diarrhea, constipation, pain near the anus) TENDERNESS IN MOUTH AND THROAT WITH OR WITHOUT PRESENCE OF ULCERS (sore throat, sores in mouth, or a toothache) UNUSUAL RASH, SWELLING OR PAIN  UNUSUAL VAGINAL DISCHARGE OR ITCHING   Items with * indicate a potential emergency and should be followed up as soon as possible or go to the Emergency Department if any problems should occur.  Please show the CHEMOTHERAPY  ALERT CARD or IMMUNOTHERAPY ALERT CARD at check-in to the Emergency Department and triage nurse.  Should you have questions after your visit or need to cancel or reschedule your appointment, please contact [facility]  Dept: [phone]  and follow the prompts.  Office hours are 8:00 a.m. to 4:30 p.m. Monday - Friday. Please note that voicemails left after 4:00 p.m. may not be returned until the following business day.  We are closed weekends and major holidays. You have access to a nurse at all times for urgent questions. Please call the main number to the clinic Dept: [phone] and follow the prompts.   For any non-urgent questions, you may also contact your provider using MyChart. We now offer e-Visits for anyone 18 and older to request care online for non-urgent symptoms. For details visit [email or web address].   Also download the MyChart app! Go to the app store, search "MyChart", open the app, select [facility], and log in with your MyChart username and password.   

## 2022-08-20 ENCOUNTER — Inpatient Hospital Stay: Payer: Medicare Other

## 2022-08-20 VITALS — BP 127/74 | HR 100 | Temp 98.7°F | Resp 16

## 2022-08-20 DIAGNOSIS — C3492 Malignant neoplasm of unspecified part of left bronchus or lung: Secondary | ICD-10-CM

## 2022-08-20 DIAGNOSIS — Z5112 Encounter for antineoplastic immunotherapy: Secondary | ICD-10-CM | POA: Diagnosis not present

## 2022-08-20 MED ORDER — PEGFILGRASTIM-FPGK 6 MG/0.6ML ~~LOC~~ SOSY
6.0000 mg | PREFILLED_SYRINGE | Freq: Once | SUBCUTANEOUS | Status: AC
  Administered 2022-08-20: 6 mg via SUBCUTANEOUS
  Filled 2022-08-20: qty 0.6

## 2022-08-23 ENCOUNTER — Other Ambulatory Visit: Payer: Self-pay | Admitting: Internal Medicine

## 2022-08-23 ENCOUNTER — Encounter: Payer: Self-pay | Admitting: Internal Medicine

## 2022-08-23 DIAGNOSIS — C3492 Malignant neoplasm of unspecified part of left bronchus or lung: Secondary | ICD-10-CM

## 2022-08-24 ENCOUNTER — Other Ambulatory Visit: Payer: Self-pay

## 2022-08-24 ENCOUNTER — Encounter: Payer: Self-pay | Admitting: Internal Medicine

## 2022-08-24 ENCOUNTER — Other Ambulatory Visit: Payer: Self-pay | Admitting: Physician Assistant

## 2022-08-24 DIAGNOSIS — C3492 Malignant neoplasm of unspecified part of left bronchus or lung: Secondary | ICD-10-CM

## 2022-08-25 ENCOUNTER — Emergency Department (HOSPITAL_COMMUNITY): Payer: Medicare Other

## 2022-08-25 ENCOUNTER — Emergency Department (HOSPITAL_COMMUNITY)
Admission: EM | Admit: 2022-08-25 | Discharge: 2022-08-25 | Disposition: A | Discharge status: Home / Self Care | Payer: Medicare Other | Attending: Emergency Medicine | Admitting: Emergency Medicine

## 2022-08-25 ENCOUNTER — Inpatient Hospital Stay: Payer: Medicare Other

## 2022-08-25 ENCOUNTER — Other Ambulatory Visit: Payer: Self-pay

## 2022-08-25 ENCOUNTER — Telehealth: Payer: Self-pay | Admitting: Family Medicine

## 2022-08-25 DIAGNOSIS — Z85828 Personal history of other malignant neoplasm of skin: Secondary | ICD-10-CM | POA: Insufficient documentation

## 2022-08-25 DIAGNOSIS — Z1152 Encounter for screening for COVID-19: Secondary | ICD-10-CM | POA: Insufficient documentation

## 2022-08-25 DIAGNOSIS — Z85118 Personal history of other malignant neoplasm of bronchus and lung: Secondary | ICD-10-CM | POA: Diagnosis not present

## 2022-08-25 DIAGNOSIS — J181 Lobar pneumonia, unspecified organism: Secondary | ICD-10-CM | POA: Diagnosis not present

## 2022-08-25 DIAGNOSIS — Z79899 Other long term (current) drug therapy: Secondary | ICD-10-CM | POA: Diagnosis not present

## 2022-08-25 DIAGNOSIS — R42 Dizziness and giddiness: Secondary | ICD-10-CM | POA: Insufficient documentation

## 2022-08-25 DIAGNOSIS — R0602 Shortness of breath: Secondary | ICD-10-CM

## 2022-08-25 DIAGNOSIS — J189 Pneumonia, unspecified organism: Secondary | ICD-10-CM

## 2022-08-25 DIAGNOSIS — C3492 Malignant neoplasm of unspecified part of left bronchus or lung: Secondary | ICD-10-CM

## 2022-08-25 LAB — CBC
HCT: 29.3 % — ABNORMAL LOW (ref 39.0–52.0)
Hemoglobin: 9.6 g/dL — ABNORMAL LOW (ref 13.0–17.0)
MCH: 29.5 pg (ref 26.0–34.0)
MCHC: 32.8 g/dL (ref 30.0–36.0)
MCV: 90.2 fL (ref 80.0–100.0)
Platelets: 160 10*3/uL (ref 150–400)
RBC: 3.25 MIL/uL — ABNORMAL LOW (ref 4.22–5.81)
RDW: 17.2 % — ABNORMAL HIGH (ref 11.5–15.5)
WBC: 8.7 10*3/uL (ref 4.0–10.5)
nRBC: 0 % (ref 0.0–0.2)

## 2022-08-25 LAB — CMP (CANCER CENTER ONLY)
ALT: 23 U/L (ref 0–44)
AST: 17 U/L (ref 15–41)
Albumin: 4.2 g/dL (ref 3.5–5.0)
Alkaline Phosphatase: 99 U/L (ref 38–126)
Anion gap: 14 (ref 5–15)
BUN: 29 mg/dL — ABNORMAL HIGH (ref 8–23)
CO2: 21 mmol/L — ABNORMAL LOW (ref 22–32)
Calcium: 9.4 mg/dL (ref 8.9–10.3)
Chloride: 100 mmol/L (ref 98–111)
Creatinine: 1.28 mg/dL — ABNORMAL HIGH (ref 0.61–1.24)
GFR, Estimated: >60 mL/min (ref >60)
Glucose, Bld: 135 mg/dL — ABNORMAL HIGH (ref 70–99)
Potassium: 4.3 mmol/L (ref 3.5–5.1)
Sodium: 135 mmol/L (ref 135–145)
Total Bilirubin: 0.4 mg/dL (ref 0.3–1.2)
Total Protein: 7.9 g/dL (ref 6.5–8.1)

## 2022-08-25 LAB — COMPREHENSIVE METABOLIC PANEL
ALT: 28 U/L (ref 0–44)
AST: 22 U/L (ref 15–41)
Albumin: 3.8 g/dL (ref 3.5–5.0)
Alkaline Phosphatase: 89 U/L (ref 38–126)
Anion gap: 10 (ref 5–15)
BUN: 31 mg/dL — ABNORMAL HIGH (ref 8–23)
CO2: 22 mmol/L (ref 22–32)
Calcium: 8.7 mg/dL — ABNORMAL LOW (ref 8.9–10.3)
Chloride: 100 mmol/L (ref 98–111)
Creatinine, Ser: 1.27 mg/dL — ABNORMAL HIGH (ref 0.61–1.24)
GFR, Estimated: >60 mL/min (ref >60)
Glucose, Bld: 126 mg/dL — ABNORMAL HIGH (ref 70–99)
Potassium: 4.3 mmol/L (ref 3.5–5.1)
Sodium: 132 mmol/L — ABNORMAL LOW (ref 135–145)
Total Bilirubin: 0.6 mg/dL (ref 0.3–1.2)
Total Protein: 7.6 g/dL (ref 6.5–8.1)

## 2022-08-25 LAB — BRAIN NATRIURETIC PEPTIDE: B Natriuretic Peptide: 72.3 pg/mL (ref 0.0–100.0)

## 2022-08-25 LAB — CBC WITH DIFFERENTIAL (CANCER CENTER ONLY)
Abs Immature Granulocytes: 0.06 10*3/uL (ref 0.00–0.07)
Basophils Absolute: 0 10*3/uL (ref 0.0–0.1)
Basophils Relative: 0 %
Eosinophils Absolute: 0 10*3/uL (ref 0.0–0.5)
Eosinophils Relative: 0 %
HCT: 28.6 % — ABNORMAL LOW (ref 39.0–52.0)
Hemoglobin: 9.7 g/dL — ABNORMAL LOW (ref 13.0–17.0)
Immature Granulocytes: 1 %
Lymphocytes Relative: 11 %
Lymphs Abs: 1 10*3/uL (ref 0.7–4.0)
MCH: 30.6 pg (ref 26.0–34.0)
MCHC: 33.9 g/dL (ref 30.0–36.0)
MCV: 90.2 fL (ref 80.0–100.0)
Monocytes Absolute: 1 10*3/uL (ref 0.1–1.0)
Monocytes Relative: 10 %
Neutro Abs: 7.7 10*3/uL (ref 1.7–7.7)
Neutrophils Relative %: 78 %
Platelet Count: 155 10*3/uL (ref 150–400)
RBC: 3.17 MIL/uL — ABNORMAL LOW (ref 4.22–5.81)
RDW: 17.2 % — ABNORMAL HIGH (ref 11.5–15.5)
Smear Review: NORMAL
WBC Count: 9.8 10*3/uL (ref 4.0–10.5)
WBC Morphology: INCREASED
nRBC: 0 % (ref 0.0–0.2)

## 2022-08-25 LAB — SARS CORONAVIRUS 2 BY RT PCR: SARS Coronavirus 2 by RT PCR: NEGATIVE

## 2022-08-25 LAB — TROPONIN I (HIGH SENSITIVITY): Troponin I (High Sensitivity): 9 ng/L (ref <18)

## 2022-08-25 MED ORDER — IOHEXOL 350 MG/ML SOLN
80.0000 mL | Freq: Once | INTRAVENOUS | Status: AC | PRN
  Administered 2022-08-25: 80 mL via INTRAVENOUS

## 2022-08-25 MED ORDER — DOXYCYCLINE HYCLATE 100 MG PO TABS
100.0000 mg | ORAL_TABLET | Freq: Once | ORAL | Status: AC
  Administered 2022-08-25: 100 mg via ORAL
  Filled 2022-08-25: qty 1

## 2022-08-25 MED ORDER — DOXYCYCLINE HYCLATE 100 MG PO CAPS: 100.0000 mg | ORAL_CAPSULE | Freq: Two times a day (BID) | ORAL | 0 refills | Status: AC

## 2022-08-25 MED ORDER — SODIUM CHLORIDE 0.9 % IV SOLN
1.0000 g | Freq: Once | INTRAVENOUS | Status: AC
  Administered 2022-08-25: 1 g via INTRAVENOUS
  Filled 2022-08-25: qty 10

## 2022-08-25 MED ORDER — SODIUM CHLORIDE 0.9 % IV BOLUS
1000.0000 mL | Freq: Once | INTRAVENOUS | Status: AC
  Administered 2022-08-25: 1000 mL via INTRAVENOUS

## 2022-08-25 NOTE — ED Provider Notes (Signed)
[name] Provider Note   CSN: [id number] Arrival date & time: 08/25/22  1520     History  Chief Complaint  Patient presents with   Shortness of Breath    Albert Hardy. is a 67 y.o. male presented to ED complaining of shortness of breath.  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  He reports that he came for treatment today and was walking across the parking lot became very winded, lightheaded.  He went home and his wife brought him into the ED after speaking to a triage person at the doctor's office.  The patient voices coming to ED feels much better, denies lightheadedness, feels that he has caught his breath back.  He has noted for the past several days feeling particularly winded after taking hot showers.  He says this is unusual for him.  Patient reports that he "definitely does not drink as much water as I need to".  CT chest with contras 08/06/22, impression:    IMPRESSION: 1. Incidental segmental sized pulmonary embolus to the right lower lobe. 2. Today's study demonstrates a positive response to therapy in terms of regression of the primary centrally located perihilar left upper lobe pulmonary nodule, along with regression of left hilar and AP window lymphadenopathy, and soft tissue metastatic lesion to the right retroperitoneum. 3. 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 No new osseous lesions are noted. 4. Indeterminate lesion in the lower pole of the left kidney measuring only 7 mm, too small to definitively characterize. Continued attention on follow-up studies is recommended to ensure stability. 5. Aortic atherosclerosis. 6. Additional incidental findings, as above.    HPI     Home Medications Prior to  Admission medications   Medication Sig Start Date End Date Taking? Authorizing Provider  doxycycline (VIBRAMYCIN) 100 MG capsule Take 1 capsule (100 mg total) by mouth 2 (two) times daily for 6 days. 08/26/22 09/01/22 Yes Letisia Schwalb, Kermit Balo, MD  albuterol (VENTOLIN HFA) 108 (90 Base) MCG/ACT inhaler Inhale 2 puffs into the lungs every 6 (six) hours as needed for wheezing or shortness of breath. Patient not taking: Reported on 08/17/2022 12/27/20   Glenford Bayley, NP  apixaban (ELIQUIS) 5 MG TABS tablet Take 1 tablet (5 mg total) by mouth 2 (two) times daily. 08/18/22   Heilingoetter, Cassandra L, PA-C  APIXABAN (ELIQUIS) VTE STARTER PACK (10MG  AND 5MG ) Take as directed on package: start with two-5mg  tablets twice daily for 7 days. On day 8, switch to one-5mg  tablet twice daily. 08/06/22   Si Gaul, MD  Ascorbic Acid (VITAMIN C PO) Take 1 tablet by mouth daily.    [provider]  Cholecalciferol (VITAMIN D3) 1000 UNITS CAPS Take 1,000 Units by mouth daily.    [provider]  Coenzyme Q10 (CO Q 10 PO) Take 1 tablet by mouth daily.    [provider]  Cyanocobalamin (VITAMIN B-12 PO) Take 1 tablet by mouth daily.    [provider]  cyclobenzaprine (FLEXERIL) 5 MG tablet Take 1 tablet (5 mg total) by mouth 3 (three) times daily as needed for muscle spasms. Patient not taking: Reported on 08/17/2022 06/18/22   Pickenpack-Cousar, Arty Baumgartner, NP  docusate sodium (COLACE) 100 MG capsule Take 1 capsule (100 mg  total) by mouth every 12 (twelve) hours. Patient not taking: Reported on 08/17/2022 06/07/22   Renne Crigler, PA-C  ezetimibe (ZETIA) 10 MG tablet Take 1 tablet (10 mg total) by mouth daily. Patient not taking: Reported on 08/17/2022 09/04/21   Baldo Daub, MD  fluticasone [facility]) 50 MCG/ACT nasal spray Place 1-2 sprays into both nostrils daily. Patient not taking: Reported on 08/17/2022 04/24/20   [name], MD  KRILL OIL PO Take 1 tablet by mouth daily.     [provider]  lisinopril-hydrochlorothiazide (ZESTORETIC) 20-12.5 MG tablet Take 2 tablets by mouth once daily 06/08/22   [name], MD  MAGNESIUM PO Take 1 tablet by mouth daily.    [provider]  metoCLOPramide (REGLAN) 10 MG tablet Take 1 tablet (10 mg total) by mouth 4 (four) times daily -  before meals and at bedtime. 07/20/22   Pickenpack-Cousar, Arty Baumgartner, NP  morphine (MS CONTIN) 15 MG 12 hr tablet Take 1 tablet (15 mg total) by mouth every 12 (twelve) hours. 08/09/22   Pickenpack-Cousar, Arty Baumgartner, NP  Multiple Vitamins-Minerals (CENTRUM SILVER PO) Take 1 tablet by mouth daily.    [provider]  Multiple Vitamins-Minerals (ZINC PO) Take 1 tablet by mouth daily.    [provider]  Naphazoline-Pheniramine (OPCON-A) 0.027-0.315 % SOLN Place 1 drop into both eyes daily as needed (redness).    [provider]  nicotine (NICODERM CQ - DOSED IN MG/24 HOURS) 14 mg/24hr patch 14mg /day for 6 weeks then 7mg /day x 2 weeks Patient not taking: Reported on 08/17/2022 08/19/20   Glenford Bayley, NP  omeprazole (PRILOSEC) 20 MG capsule Take 1 capsule (20 mg total) by mouth daily. 08/17/22   [name], MD  oxyCODONE (OXY IR/ROXICODONE) 5 MG immediate release tablet Take 1 tablet (5 mg total) by mouth every 4 (four) hours as needed for severe pain. 08/09/22   Pickenpack-Cousar, Arty Baumgartner, NP  prochlorperazine (COMPAZINE) 10 MG tablet Take 1 tablet (10 mg total) by mouth every 6 (six) hours as needed for nausea or vomiting. 06/09/22   Si Gaul, MD  rosuvastatin (CRESTOR) 5 MG tablet Take 1 tablet (5 mg total) by mouth at bedtime. 08/17/22   [name], MD  sucralfate (CARAFATE) 1 g tablet Take 1 tablet (1 g total) by mouth 4 (four) times daily -  with meals and at bedtime. Crush and dissolve in 10 mL of warm water prior to swallowing, take 20 to 30 minutes before meals 07/16/22   Antony Blackbird, MD  TURMERIC PO Take 1 tablet by mouth 3 (three)  times a week.    [provider]  umeclidinium-vilanterol (ANORO ELLIPTA) 62.5-25 MCG/ACT AEPB Inhale 1 puff into the lungs daily. Patient not taking: Reported on 08/17/2022 01/30/21   Josephine Igo, DO      Allergies    Lipitor [atorvastatin] and Pravastatin    Review of Systems   Review of Systems  Physical Exam Updated Vital Signs BP 107/63   Pulse 81   Temp 98.7 F (37.1 C) (Oral)   Resp 18   Ht 5\' 9"  (1.753 m)   Wt 66.7 kg   SpO2 97%   BMI 21.72 kg/m  Physical Exam Constitutional:      General: He is not in acute distress. HENT:     Head: Normocephalic and atraumatic.  Eyes:     Conjunctiva/sclera: Conjunctivae normal.     Pupils: Pupils are equal, round, and reactive to light.  Cardiovascular:  Rate and Rhythm: Regular rhythm. Tachycardia present.  Pulmonary:     Effort: Pulmonary effort is normal. No respiratory distress.  Abdominal:     General: There is no distension.     Tenderness: There is no abdominal tenderness.  Skin:    General: Skin is warm and dry.  Neurological:     General: No focal deficit present.     Mental Status: He is alert. Mental status is at baseline.  Psychiatric:        Mood and Affect: Mood normal.        Behavior: Behavior normal.     ED Results / Procedures / Treatments   Labs (all labs ordered are listed, but only abnormal results are displayed) Labs Reviewed  CBC - Abnormal; Notable for the following components:      Result Value   RBC 3.25 (*)    Hemoglobin 9.6 (*)    HCT 29.3 (*)    RDW 17.2 (*)    All other components within normal limits  COMPREHENSIVE METABOLIC PANEL - Abnormal; Notable for the following components:   Sodium 132 (*)    Glucose, Bld 126 (*)    BUN 31 (*)    Creatinine, Ser 1.27 (*)    Calcium 8.7 (*)    All other components within normal limits  SARS CORONAVIRUS 2 BY RT PCR  BRAIN NATRIURETIC PEPTIDE  TROPONIN I (HIGH SENSITIVITY)    EKG EKG  Interpretation Date/Time:  Tuesday August 25 2022 15:30:43 EDT Ventricular Rate:  125 PR Interval:  130 QRS Duration:  97 QT Interval:  321 QTC Calculation: 463 R Axis:   72  Text Interpretation: Sinus tachycardia Probable inferior infarct, old Consider anterior infarct Confirmed by Alvester Chou [phone]) on 08/25/2022 4:34:55 PM  Radiology CT Angio Chest PE W and/or Wo Contrast  Result Date: 08/25/2022 CLINICAL DATA:  Pulmonary embolus suspected with high probability. Recent diagnosis of pulmonary embolus in June 2024. Acute worsening of dyspnea. Shortness of breath and dizziness since this morning. History of lung cancer. EXAM: CT ANGIOGRAPHY CHEST WITH CONTRAST TECHNIQUE: Multidetector CT imaging of the chest was performed using the standard protocol during bolus administration of intravenous contrast. Multiplanar CT image reconstructions and MIPs were obtained to evaluate the vascular anatomy. RADIATION DOSE REDUCTION: This exam was performed according to the departmental dose-optimization program which includes automated exposure control, adjustment of the mA and/or kV according to patient size and/or use of iterative reconstruction technique. CONTRAST:  80mL OMNIPAQUE IOHEXOL 350 MG/ML SOLN COMPARISON:  08/06/2022 FINDINGS: Cardiovascular: There is good opacification of the central and segmental pulmonary arteries. No focal filling defects are identified. No evidence of significant pulmonary embolus. The previous right lower lobe pulmonary embolus has resolved in the interval. Normal heart size. No pericardial effusions. Normal caliber thoracic aorta. No aortic dissection. Calcification of the aorta. Mediastinum/Nodes: Esophagus is decompressed. No significant lymphadenopathy. Thyroid gland is unremarkable. Lungs/Pleura: Patchy consolidation focally in the right lower lung with secretions or debris in the adjacent right lower lobe bronchus. This is likely pneumonia or aspiration. Left lung is  clear. No pleural effusions. No pneumothorax. Upper Abdomen: No acute abnormality. Musculoskeletal: Lucent destructive bone lesion in the T8 vertebra, likely metastatic. No change since prior study. Review of the MIP images confirms the above findings. IMPRESSION: 1. No evidence of significant pulmonary embolus. Previous right lower lobe pulmonary embolus has resolved in the interval. 2. New focal area of consolidation in the right lower lung with secretions or debris in the  adjacent right lower lobe bronchus. Changes likely to represent pneumonia or aspiration. 3. Lucent lesion in the T8 vertebra, likely metastasis.  No change. Electronically Signed   By: Burman Nieves M.D.   On: 08/25/2022 17:55   DG Chest Port 1 View  Result Date: 08/25/2022 CLINICAL DATA:  Shortness of breath EXAM: PORTABLE CHEST 1 VIEW COMPARISON:  10/25/2019, PET CT 06/04/2022, chest CT 08/06/2022 FINDINGS: No acute airspace disease or effusion. Known left perihilar upper lobe mass is better seen on previous CT imaging. Normal cardiac size. No pneumothorax IMPRESSION: No active disease. Known left perihilar upper lobe mass is better seen on previous CT imaging. Electronically Signed   By: Jasmine Pang M.D.   On: 08/25/2022 17:13    Procedures Procedures    Medications Ordered in ED Medications  cefTRIAXone (ROCEPHIN) 1 g in sodium chloride 0.9 % 100 mL IVPB (1 g Intravenous New Bag/Given 08/25/22 1811)  sodium chloride 0.9 % bolus 1,000 mL (1,000 mLs Intravenous New Bag/Given 08/25/22 1625)  iohexol (OMNIPAQUE) 350 MG/ML injection 80 mL (80 mLs Intravenous Contrast Given 08/25/22 1728)  doxycycline (VIBRA-TABS) tablet 100 mg (100 mg Oral Given 08/25/22 1812)    ED Course/ Medical Decision Making/ A&P                             Medical Decision Making Amount and/or Complexity of Data Reviewed Labs: ordered. Radiology: ordered.  Risk Prescription drug management.   This patient presents to the ED with concern for  shortness of breath, lightheadedness. This involves an extensive number of treatment options, and is a complaint that carries with it a high risk of complications and morbidity.  The differential diagnosis includes pleural effusion versus new PE clot burden versus arrhythmia versus anemia versus infection versus other  Patient does appear to have a new or sinus tachycardia on arrival here.  Will give him some IV fluids as he feels he may be dehydrated  Co-morbidities that complicate the patient evaluation: History of PE and malignancy at high risk of thromboembolic event and/or pleural effusion  Additional history obtained from patient's wife at bedside  External records from outside source obtained and reviewed including prior CT imaging as noted above  I ordered and personally interpreted labs.  The pertinent results include:  no emergent findings; hgb near baseline level  I ordered imaging studies including dg chest, CT PE  I independently visualized and interpreted imaging which showed no acute PE, persistent lung mass, questionable aspiration/bronchus plug or PNA in RLL I agree with the radiologist interpretation  The patient was maintained on a cardiac monitor.  I personally viewed and interpreted the cardiac monitored which showed an underlying rhythm of: Sinus tachycardia  Per my interpretation the patient's ECG shows sinus tachycardia without acute ischemic findings  I ordered medication including IV fluids for tachycardia; IV Rocephin and doxycycline for community pneumonia  I have reviewed the patients home medicines and have made adjustments as needed  After the interventions noted above, I reevaluated the patient and found that they have: improved.  HR improved with IV fluids consistent with dehydration.  The patient was not hypoxic, he was comfortable going home.  I think this is reasonable at this time.  He was advised to drink fluids more  aggressively.   Dispostion:  After consideration of the diagnostic results and the patients response to treatment, I feel that the patent would benefit from close outpatient follow up  Final Clinical Impression(s) / ED Diagnoses Final diagnoses:  Shortness of breath  Pneumonia of right lower lobe due to infectious organism    Rx / DC Orders ED Discharge Orders          Ordered    doxycycline (VIBRAMYCIN) 100 MG capsule  2 times daily        08/25/22 1806              Terald Sleeper, MD 08/25/22 Paulo Fruit

## 2022-08-25 NOTE — Discharge Instructions (Addendum)
Your CT scan showed a potential pneumonia or infection in your right lower lung.  We gave you IV antibiotics in the ER and prescribed an additional 6 days of antibiotics you will take at home, beginning tomorrow.  Please follow-up with your primary care provider or your oncologist.

## 2022-08-25 NOTE — ED Triage Notes (Addendum)
Pt reports shortness of breath and dizziness since this morning. Hx lung cancer Pt dx with blood clot in [place] a few weeks ago

## 2022-08-25 NOTE — Telephone Encounter (Signed)
Caller name: Zyren Sevigny  On DPR?: Yes  Call back number: [phone] (mobile)  Provider they see: [name], MD  Reason for call:  Spouse called stating husband has Stage 4 CA and hs onset on SOB. Sent her to Triage   Patient Name First: Albert Hardy Last: [place] Gender: Male DOB: 08-05-1955 Age: 67 Y 2 M 2 D Return Phone Number: [phone] (Primary), [phone] (Secondary) Address: City/ State/ Zip: [place]  [address] Client [facility] [facility] [place] Client Site [facility] - Day Provider Meredith Staggers- MD Contact Type Call Who Is Calling Patient / Member / Family / Caregiver Call Type Triage / Clinical Caller Name Eriberto Felch Relationship To Patient Spouse Return Phone Number [phone] (Primary) Chief Complaint BREATHING - shortness of breath or sounds breathless Reason for Call Symptomatic / Request for Health Information Initial Comment Caller states her husband has stage 4 lung cancer and is having a hard time breathing. Translation No Nurse Assessment Nurse: Kizzie Bane, RN, Marylene Land Date/Time (Eastern Time): 08/25/2022 2:49:28 PM Confirm and document reason for call. If symptomatic, describe symptoms. ---Caller states he is having shortness of breath that started earlier today. No other symptoms Does the patient have any new or worsening symptoms? ---Yes Will a triage be completed? ---Yes Related visit to physician within the last 2 weeks? ---No Does the PT have any chronic conditions? (i.e. diabetes, asthma, this includes High risk factors for pregnancy, etc.) ---Yes List chronic conditions. ---copd Is this a behavioral health or substance abuse call? ---No Guidelines Guideline Title Affirmed Question Affirmed Notes Nurse Date/Time (Eastern Time) Breathing Difficulty History of prior "blood clot" in leg or lungs (i.e., deep vein thrombosis, pulmonary embolism) Kizzie Bane, RN, Marylene Land  08/25/2022 2:52:05 PM PLEASE NOTE: All timestamps contained within this report are represented as [place] Standard Time. CONFIDENTIALTY NOTICE: This fax transmission is intended only for the addressee. It contains information that is legally privileged, confidential or otherwise protected from use or disclosure. If you are not the intended recipient, you are strictly prohibited from reviewing, disclosing, copying using or disseminating any of this information or taking any action in reliance on or regarding this information. If you have received this fax in error, please notify us immediately by telephone so that we can arrange for its return to [place]. Phone: [phone], Toll-Free: [phone], Fax: [phone] Page: 2 of 2 Call Id: [id number] Disp. Time Lamount Cohen Time) Disposition Final User 08/25/2022 2:46:58 PM Send to Urgent Queue Kirtland Bouchard 08/25/2022 2:56:21 PM Go to ED Now Yes Kizzie Bane, RN, Marylene Land Final Disposition 08/25/2022 2:56:21 PM Go to ED Now Yes Kizzie Bane, RN, Rosalyn Charters Disagree/Comply Comply Caller Understands Yes PreDisposition Did not know what to do Care Advice Given Per Guideline GO TO ED NOW: * You need to be seen in the Emergency Department. * Go to the ED at ___________ Hospital. * Leave now. Drive carefully. * Another adult should drive. * Patient should not delay going to the emergency department. CARE ADVICE given per Breathing Difficulty (Adult) guideline. CALL EMS 911 IF: * Call EMS if you become worse. Referrals [facility] - ED

## 2022-08-26 NOTE — Telephone Encounter (Signed)
Agree with triage eval and ER eval recommendation.

## 2022-08-28 ENCOUNTER — Telehealth: Payer: Self-pay

## 2022-08-28 NOTE — Transitions of Care (Post Inpatient/ED Visit) (Addendum)
   08/28/2022  Name: Albert Hardy. MRN: [id number] DOB: 1955-06-13  Today's TOC FU Call Status: Today's TOC FU Call Status:: Unsuccessul Call (1st Attempt) Unsuccessful Call (1st Attempt) Date: 08/28/22  Red on EMMI-ED Discharge Alert Date & Reason:08/27/22 "Scheduled follow-up appt? No"  Attempted to reach the patient regarding the most recent Inpatient/ED visit.  Follow Up Plan: Additional outreach attempts will be made to reach the patient to complete the Transitions of Care (Post Inpatient/ED visit) call.      Antionette Fairy, RN,BSN,CCM [facility] Care Management Care Management Community Coordinator Direct Phone: [phone] Toll Free: [phone] Fax: [phone]

## 2022-08-31 ENCOUNTER — Telehealth: Payer: Self-pay | Admitting: *Deleted

## 2022-08-31 ENCOUNTER — Other Ambulatory Visit: Payer: Self-pay | Admitting: Medical Oncology

## 2022-08-31 ENCOUNTER — Encounter: Payer: Self-pay | Admitting: *Deleted

## 2022-08-31 DIAGNOSIS — C3492 Malignant neoplasm of unspecified part of left bronchus or lung: Secondary | ICD-10-CM

## 2022-08-31 NOTE — Transitions of Care (Post Inpatient/ED Visit) (Signed)
   08/31/2022  Name: Jaydien Panepinto. MRN: [id number] DOB: Mar 12, 1955  Today's TOC FU Call Status: Today's TOC FU Call Status:: Unsuccessful Call (2nd Attempt)  ED EMMI Red Alert notification on 08/28/22 from ED visit 08/25/22/24- automated EMMI call placed 08/27/22: "No scheduled follow up" and "No discharge instructions"  Attempted to reach the patient regarding the most recent ED visit; left HIPAA compliant voice message requesting call back  Follow Up Plan: Additional outreach attempts will be made to reach the patient to complete the Transitions of Care (Post ED visit) call.   Caryl Pina, RN, BSN, CCRN Alumnus RN CM Care Coordination/ Transition of Care- [facility] Care Management [phone]: direct office

## 2022-09-01 ENCOUNTER — Other Ambulatory Visit: Payer: Self-pay

## 2022-09-01 ENCOUNTER — Inpatient Hospital Stay: Payer: Medicare Other

## 2022-09-01 ENCOUNTER — Telehealth: Payer: Self-pay | Admitting: *Deleted

## 2022-09-01 ENCOUNTER — Telehealth: Payer: Self-pay | Admitting: Pharmacy Technician

## 2022-09-01 ENCOUNTER — Encounter: Payer: Self-pay | Admitting: *Deleted

## 2022-09-01 DIAGNOSIS — C3492 Malignant neoplasm of unspecified part of left bronchus or lung: Secondary | ICD-10-CM

## 2022-09-01 DIAGNOSIS — Z5986 Financial insecurity: Secondary | ICD-10-CM

## 2022-09-01 DIAGNOSIS — Z5112 Encounter for antineoplastic immunotherapy: Secondary | ICD-10-CM | POA: Diagnosis not present

## 2022-09-01 LAB — CBC WITH DIFFERENTIAL (CANCER CENTER ONLY)
Abs Immature Granulocytes: 0.06 10*3/uL (ref 0.00–0.07)
Basophils Absolute: 0 10*3/uL (ref 0.0–0.1)
Basophils Relative: 0 %
Eosinophils Absolute: 0 10*3/uL (ref 0.0–0.5)
Eosinophils Relative: 0 %
HCT: 27 % — ABNORMAL LOW (ref 39.0–52.0)
Hemoglobin: 9.1 g/dL — ABNORMAL LOW (ref 13.0–17.0)
Immature Granulocytes: 1 %
Lymphocytes Relative: 16 %
Lymphs Abs: 1.2 10*3/uL (ref 0.7–4.0)
MCH: 31.1 pg (ref 26.0–34.0)
MCHC: 33.7 g/dL (ref 30.0–36.0)
MCV: 92.2 fL (ref 80.0–100.0)
Monocytes Absolute: 0.7 10*3/uL (ref 0.1–1.0)
Monocytes Relative: 10 %
Neutro Abs: 5.4 10*3/uL (ref 1.7–7.7)
Neutrophils Relative %: 73 %
Platelet Count: 266 10*3/uL (ref 150–400)
RBC: 2.93 MIL/uL — ABNORMAL LOW (ref 4.22–5.81)
RDW: 18.6 % — ABNORMAL HIGH (ref 11.5–15.5)
WBC Count: 7.4 10*3/uL (ref 4.0–10.5)
nRBC: 0 % (ref 0.0–0.2)

## 2022-09-01 LAB — CMP (CANCER CENTER ONLY)
ALT: 17 U/L (ref 0–44)
AST: 16 U/L (ref 15–41)
Albumin: 4.1 g/dL (ref 3.5–5.0)
Alkaline Phosphatase: 82 U/L (ref 38–126)
Anion gap: 12 (ref 5–15)
BUN: 23 mg/dL (ref 8–23)
CO2: 23 mmol/L (ref 22–32)
Calcium: 9.5 mg/dL (ref 8.9–10.3)
Chloride: 105 mmol/L (ref 98–111)
Creatinine: 1.19 mg/dL (ref 0.61–1.24)
GFR, Estimated: >60 mL/min (ref >60)
Glucose, Bld: 99 mg/dL (ref 70–99)
Potassium: 3.9 mmol/L (ref 3.5–5.1)
Sodium: 140 mmol/L (ref 135–145)
Total Bilirubin: 0.2 mg/dL — ABNORMAL LOW (ref 0.3–1.2)
Total Protein: 7.3 g/dL (ref 6.5–8.1)

## 2022-09-01 LAB — TSH: TSH: 0.871 u[IU]/mL (ref 0.350–4.500)

## 2022-09-01 NOTE — Transitions of Care (Post Inpatient/ED Visit) (Signed)
09/01/2022  Name: Albert Hardy. MRN: [id number] DOB: 06-02-1955  Today's TOC FU Call Status: Today's TOC FU Call Status:: Successful TOC FU Call Competed TOC FU Call Complete Date: 09/01/22  ED EMMI Red Alert notification on 08/28/22 from ED visit 08/25/22/24- automated EMMI call placed 08/27/22: "No scheduled follow up" and "No discharge instructions"   Transition Care Management Follow-up Telephone Call Date of Discharge: 08/25/22 Discharge Facility: [facility]) Type of Discharge: Emergency Department Primary Inpatient Discharge Diagnosis:: SOB with pneumonia in setting of lung CA Reason for ED Visit: Respiratory Respiratory Diagnosis: Pnuemonia How have you been since you were released from the hospital?: Better ("I am doing okay I guess, took the antibiotics like they told me to and feeling better.  I don't have any of the inhalers on my medication list-- and paying for the Eliqius is very difficult") Any questions or concerns?: Yes Patient Questions/Concerns:: Does not have prescribed inhalers: needs new Rx for rescue inhaler; can not afford maintenance inhaler; has great difficulty affording Eliquis Patient Questions/Concerns Addressed: Notified Provider of Patient Questions/Concerns, Other: (placed Pharmacy referral to address all of above)  Items Reviewed: Did you receive and understand the discharge instructions provided?: Yes (thoroughly reviewed with patient who verbalizes good understanding of same) Medications obtained,verified, and reconciled?: Yes (Medications Reviewed) (Full medication reconciliation/ review completed; no concerns or discrepancies identified; confirmed patient obtained/ is taking all newly Rx'd medications as instructed; self-manages medications; pharmacy referral placed, as above) Any new allergies since your discharge?: No Dietary orders reviewed?: Yes Type of Diet Ordered:: "As healthy as possible" Do you have support at home?: Yes People in Home:  spouse Name of Support/Comfort Primary Source: Reports independent in self-care activities; supportive spouse assists as/ if needed/ indicated  Medications Reviewed Today: Medications Reviewed Today     Reviewed by Michaela Corner, RN (Registered Nurse) on 09/01/22 at 1050  Med List Status: <None>   Medication Order Taking? Sig Documenting Provider Last Dose Status Informant  albuterol (VENTOLIN HFA) 108 (90 Base) MCG/ACT inhaler [id number] No Inhale 2 puffs into the lungs every 6 (six) hours as needed for wheezing or shortness of breath.  Patient not taking: Reported on 08/17/2022   Glenford Bayley, NP Not Taking Active Self           Med Note Michaela Corner   Tue Sep 01, 2022 10:41 AM) 09/01/22: Reports during TOC call, he does not have this medication and needs new RX   apixaban (ELIQUIS) 5 MG TABS tablet [id number] Yes Take 1 tablet (5 mg total) by mouth 2 (two) times daily. Heilingoetter, Johnette Abraham, PA-C Taking Active            Med Note Michaela Corner   Tue Sep 01, 2022 10:42 AM) 09/01/22: Reports during TOC call, this medication costs him > $400.00/month: referral placed with pharmacy to explore PAP options  APIXABAN (ELIQUIS) VTE STARTER PACK (10MG  AND 5MG ) [id number] Yes Take as directed on package: start with two-5mg  tablets twice daily for 7 days. On day 8, switch to one-5mg  tablet twice daily. Si Gaul, MD Taking Active   Ascorbic Acid (VITAMIN C PO) [id number] Yes Take 1 tablet by mouth daily. [provider] Taking Active Self  Cholecalciferol (VITAMIN D3) 1000 UNITS CAPS [id number] Yes Take 1,000 Units by mouth daily. [provider] Taking Active Self  Coenzyme Q10 (CO Q 10 PO) [id number] Yes Take 1 tablet by mouth daily. [provider] Taking Active Self  Cyanocobalamin (VITAMIN  B-12 PO) [id number] Yes Take 1 tablet by mouth daily. [provider] Taking Active Self  cyclobenzaprine (FLEXERIL) 5 MG tablet [id number] Yes Take 1 tablet (5  mg total) by mouth 3 (three) times daily as needed for muscle spasms. Pickenpack-Cousar, Arty Baumgartner, NP Taking Active            Med Note Michaela Corner   Tue Sep 01, 2022 10:23 AM) 09/01/22: Reports during TOC call, has not needed recently-- would like new Rx  docusate sodium (COLACE) 100 MG capsule [id number] No Take 1 capsule (100 mg total) by mouth every 12 (twelve) hours.  Patient not taking: Reported on 08/17/2022   Renne Crigler, PA-C Not Taking Active   doxycycline (VIBRAMYCIN) 100 MG capsule [id number] No Take 1 capsule (100 mg total) by mouth 2 (two) times daily for 6 days.  Patient not taking: Reported on 09/01/2022   Terald Sleeper, MD Not Taking Active            Med Note Michaela Corner   Tue Sep 01, 2022 10:42 AM) 09/01/22: Reports during TOC call, completed course as prescribed  ezetimibe (ZETIA) 10 MG tablet [id number] Yes Take 1 tablet (10 mg total) by mouth daily. Baldo Daub, MD Taking Active Self  fluticasone [facility]) 50 MCG/ACT nasal spray [id number] Yes Place 1-2 sprays into both nostrils daily. [name], MD Taking Active Self           Med Note Michaela Corner   Tue Sep 01, 2022 10:24 AM) 09/01/22: Reports during TOC call, has not needed recently   KRILL OIL PO [id number] Yes Take 1 tablet by mouth daily. [provider] Taking Active Self  lisinopril-hydrochlorothiazide (ZESTORETIC) 20-12.5 MG tablet [id number] Yes Take 2 tablets by mouth once daily [name], MD Taking Active   MAGNESIUM PO [id number] Yes Take 1 tablet by mouth daily. [provider] Taking Active Self  metoCLOPramide (REGLAN) 10 MG tablet [id number] Yes Take 1 tablet (10 mg total) by mouth 4 (four) times daily -  before meals and at bedtime. Pickenpack-Cousar, Arty Baumgartner, NP Taking Active            Med Note Michaela Corner   Tue Sep 01, 2022 10:24 AM) 09/01/22: Reports during TOC call, has not needed recently   morphine (MS CONTIN) 15 MG 12 hr tablet [id number] Yes Take 1  tablet (15 mg total) by mouth every 12 (twelve) hours. Pickenpack-Cousar, Arty Baumgartner, NP Taking Active   Multiple Vitamins-Minerals (CENTRUM SILVER PO) [id number] Yes Take 1 tablet by mouth daily. [provider] Taking Active Self  Multiple Vitamins-Minerals (ZINC PO) [id number] Yes Take 1 tablet by mouth daily. [provider] Taking Active Self  Naphazoline-Pheniramine (OPCON-A) 0.027-0.315 % SOLN [id number] Yes Place 1 drop into both eyes daily as needed (redness). [provider] Taking Active Self  nicotine (NICODERM CQ - DOSED IN MG/24 HOURS) 14 mg/24hr patch [id number] No 14mg /day for 6 weeks then 7mg /day x 2 weeks  Patient not taking: Reported on 08/17/2022   Glenford Bayley, NP Not Taking Active Self  omeprazole (PRILOSEC) 20 MG capsule [id number] Yes Take 1 capsule (20 mg total) by mouth daily. [name], MD Taking Active   oxyCODONE (OXY IR/ROXICODONE) 5 MG immediate release tablet [id number] Yes Take 1 tablet (5 mg total) by mouth every 4 (four) hours as needed for severe pain. Pickenpack-Cousar, Arty Baumgartner, NP Taking Active   prochlorperazine (COMPAZINE) 10 MG tablet [id number] Yes Take  1 tablet (10 mg total) by mouth every 6 (six) hours as needed for nausea or vomiting. Si Gaul, MD Taking Active   rosuvastatin (CRESTOR) 5 MG tablet [id number] Yes Take 1 tablet (5 mg total) by mouth at bedtime. [name], MD Taking Active   sucralfate (CARAFATE) 1 g tablet [id number] No Take 1 tablet (1 g total) by mouth 4 (four) times daily -  with meals and at bedtime. Crush and dissolve in 10 mL of warm water prior to swallowing, take 20 to 30 minutes before meals  Patient not taking: Reported on 09/01/2022   Antony Blackbird, MD Not Taking Active   TURMERIC PO [id number] Yes Take 1 tablet by mouth 3 (three) times a week. [provider] Taking Active Self  umeclidinium-vilanterol (ANORO ELLIPTA) 62.5-25 MCG/ACT AEPB [id number] No Inhale 1 puff into the lungs  daily.  Patient not taking: Reported on 08/17/2022   Josephine Igo, DO Not Taking Active Self           Med Note Michaela Corner   Tue Sep 01, 2022 10:44 AM) 09/01/22: Reports during TOC call, he could not afford this medication (stated costs > $500.00/month) and has "not taken since it was prescribed;" PCP made aware; Pharmacy referral placed           Home Care and Equipment/Supplies: Were Home Health Services Ordered?: No Any new equipment or medical supplies ordered?: No  Functional Questionnaire: Do you need assistance with bathing/showering or dressing?: No Do you need assistance with meal preparation?: No Do you need assistance with eating?: No Do you have difficulty maintaining continence: No Do you need assistance with getting out of bed/getting out of a chair/moving?: No Do you have difficulty managing or taking your medications?: Yes (can't afford several medications; pharmacy referral placed; self-manages medications- wife assists as/ if needed/ indicated)  Follow up appointments reviewed: PCP Follow-up appointment confirmed?: Yes (care coordination outreach in real-time with scheduling care guide to successfully schedule hospital follow up PCP appointment 09/02/22) Date of PCP follow-up appointment?: 09/02/22 Follow-up Provider: PCP Specialist Hospital Follow-up appointment confirmed?: Yes Date of Specialist follow-up appointment?: 09/08/22 Follow-Up Specialty Provider:: oncology provider Do you need transportation to your follow-up appointment?: No Do you understand care options if your condition(s) worsen?: Yes-patient verbalized understanding  SDOH Interventions Today    Flowsheet Row Most Recent Value  SDOH Interventions   Food Insecurity Interventions Intervention Not Indicated  Transportation Interventions Intervention Not Indicated  [drives self,  wife assists if needed]      TOC Interventions Today    Flowsheet Row Most Recent Value  TOC Interventions    TOC Interventions Discussed/Reviewed TOC Interventions Discussed, Arranged PCP follow up less than 12 days/Care Guide scheduled      Interventions Today    Flowsheet Row Most Recent Value  Chronic Disease   Chronic disease during today's visit Other  [SOB/ pneumonia in setting of lung CA]  General Interventions   General Interventions Discussed/Reviewed General Interventions Discussed, Durable Medical Equipment (DME), Communication with, Doctor Visits, Referral to Nurse  Doctor Visits Discussed/Reviewed Doctor Visits Discussed, PCP, Specialist  Durable Medical Equipment (DME) Other  [confirmed not currently requiring/ using assistive devices]  PCP/Specialist Visits Compliance with follow-up visit  Communication with PCP/Specialists, RN, Pharmacists  Education Interventions   Education Provided Provided Education  Provided Verbal Education On Medication  [PAP application process for Eliquis]  Nutrition Interventions   Nutrition Discussed/Reviewed Nutrition Discussed  Pharmacy Interventions   Pharmacy Dicussed/Reviewed Pharmacy Topics Discussed  [  Full medication review with updating medication list in EHR per patient report,  pharmacy referral placed]      Caryl Pina, RN, BSN, CCRN Alumnus RN CM Care Coordination/ Transition of Care- [facility] Care Management [phone]: direct office

## 2022-09-01 NOTE — Progress Notes (Signed)
[facility])  [facility] Quality Pharmacy Team   09/01/2022  Albert Hardy. 25-Feb-1955 [id number]  Reason for referral: Medication assistance for Anoro and Eliquis  Referral source: [facility] RN [name] Current insurance:  [employer] for pharmacy per Dr. Tiajuana Amass data  Outreach:  Successful telephone call with patient.  HIPAA identifiers verified. Patient informs this was not a good time to pre screen him for patient assistance with [employer] for Eliquis and [employer] for Anoro. He requested a call back on Thursday at 3pm.  Plan: Will follow up with phone call as requested above.  Thank you for allowing [facility] pharmacy to be a part of this patient's care.   [name], CPhT [place]  [facility] Office: [phone] Fax: [phone] Email: [email or web address]

## 2022-09-02 ENCOUNTER — Telehealth: Payer: Self-pay | Admitting: Pharmacist

## 2022-09-02 ENCOUNTER — Encounter: Payer: Self-pay | Admitting: Family Medicine

## 2022-09-02 ENCOUNTER — Ambulatory Visit (INDEPENDENT_AMBULATORY_CARE_PROVIDER_SITE_OTHER): Payer: Medicare Other | Admitting: Family Medicine

## 2022-09-02 VITALS — BP 122/70 | HR 76 | Temp 98.1°F | Ht 69.0 in | Wt 142.0 lb

## 2022-09-02 DIAGNOSIS — J189 Pneumonia, unspecified organism: Secondary | ICD-10-CM

## 2022-09-02 DIAGNOSIS — E785 Hyperlipidemia, unspecified: Secondary | ICD-10-CM

## 2022-09-02 DIAGNOSIS — F1910 Other psychoactive substance abuse, uncomplicated: Secondary | ICD-10-CM

## 2022-09-02 DIAGNOSIS — J431 Panlobular emphysema: Secondary | ICD-10-CM

## 2022-09-02 DIAGNOSIS — K219 Gastro-esophageal reflux disease without esophagitis: Secondary | ICD-10-CM

## 2022-09-02 DIAGNOSIS — C3492 Malignant neoplasm of unspecified part of left bronchus or lung: Secondary | ICD-10-CM

## 2022-09-02 DIAGNOSIS — I10 Essential (primary) hypertension: Secondary | ICD-10-CM

## 2022-09-02 LAB — T4: T4, Total: 8.1 ug/dL (ref 4.5–12.0)

## 2022-09-02 MED ORDER — LISINOPRIL-HYDROCHLOROTHIAZIDE 20-12.5 MG PO TABS
2.0000 | ORAL_TABLET | Freq: Every day | ORAL | 1 refills | Status: DC
Start: 2022-09-02 — End: 2022-10-08

## 2022-09-02 MED ORDER — OMEPRAZOLE 20 MG PO CPDR
20.0000 mg | DELAYED_RELEASE_CAPSULE | Freq: Every day | ORAL | 1 refills | Status: DC
Start: 2022-09-02 — End: 2023-04-19

## 2022-09-02 MED ORDER — EZETIMIBE 10 MG PO TABS
10.0000 mg | ORAL_TABLET | Freq: Every day | ORAL | 3 refills | Status: DC
Start: 2022-09-02 — End: 2023-08-30

## 2022-09-02 MED ORDER — ROSUVASTATIN CALCIUM 5 MG PO TABS
5.0000 mg | ORAL_TABLET | Freq: Every day | ORAL | 1 refills | Status: DC
Start: 2022-09-02 — End: 2023-03-29

## 2022-09-02 MED ORDER — ALBUTEROL SULFATE HFA 108 (90 BASE) MCG/ACT IN AERS
2.0000 | INHALATION_SPRAY | Freq: Four times a day (QID) | RESPIRATORY_TRACT | 6 refills | Status: DC | PRN
Start: 2022-09-02 — End: 2023-05-19

## 2022-09-02 NOTE — Patient Instructions (Signed)
Thank you for coming in today.  I am glad you have improved since the ER visit.  Continue to stay hydrated, try over-the-counter iron supplement once per day, and keep follow-up with the pharmacist tomorrow to look into options for your Eliquis and Anoro.  I did refill the albuterol and some of your other chronic medications.  We can check cholesterol levels at next visit as blood work yesterday was overall reassuring.  Please let me know if I can be of any further assistance and take care.

## 2022-09-02 NOTE — Progress Notes (Addendum)
Subjective:  Patient ID: Albert Hardy., male    DOB: 1955-02-25  Age: 67 y.o. MRN: [id number]  CC:  Chief Complaint  Patient presents with   Hospitalization Follow-up    Pt got winded on his way into the cancer center, called here and was told to go to ED, they stated he was deemed dehydrated coupled with history of smoking, they did find signs of pneumonia and was given abx did finish those     HPI Albert Hardy. presents for ER follow-up.  Last visit with me in May 2023.  Evaluated at [facility], ER on July 16 for dyspnea.  History of stage IV non-small cell, squamous cell left lung cancer, diagnosed in April 2024, followed by [facility].treatment with systemic chemotherapy, immunotherapy, and palliative radiotherapy to metastatic bone disease. Undergoing treatment as well for pulmonary embolus -on Eliquis.  Incidental segmental size pulmonary embolus in right lower lobe noted on CT 08/06/2022.  He is followed by hospice as well with last note reviewed from July 9, on MS Contin 15 mg every 12 hours, Oxy IR 5 mg every 4-6 hours as needed for breakthrough pain and Flexeril as needed for spasms for neoplasm related pain.  Palliative focused treatment. Presented to cancer for treatment on the 16th but increased dyspnea with feeling winded and lightheaded across parking lot.  Went home, called PCP office and advise ED evaluation.  Per ER note had improved, had caught his breath but had been feeling particularly winded after taking hot showers for a few days prior.  Did note in the ER that he was not drinking as much water as needed.  Testing in the ER reviewed, sodium 132, creatinine 1.27, hemoglobin 9.6.  (Known anemia at baseline per July 9 oncology visit, iron supplements have been recommended), EKG with sinus tachycardia, without acute ischemic findings.  Repeat CT angiogram without evidence of significant pulmonary embolus.  Previous right lower lobe PE resolved in the interval.   New focal area of consolidation in the right lower lung with secretions or debris in the adjacent right lower lobe bronchus, likely to represent pneumonia or aspiration.  Lucent lesion in the T8 vertebra likely metastasis, no change.  He was given Rocephin 1 g, IV fluid, and started on doxycycline for community-acquired pneumonia.  Tachycardia improved with IV fluids consistent with dehydration.  Patient was not hypoxic and discharged home with recommendations to increase fluid intake.  Transition of care phone call reviewed from yesterday.  Difficulty with cost for his a Anoro and Eliquis, and out of albuterol.  Referral to pharmacy placed to look at patient assistance program.History of emphysema, treated with an Anoro Ellipta and albuterol if needed.  Since ER visit has been doing well - no real problems. Taking it day by day.  Appetite is improving, done with radiation and chemo. Planned immunotherapy next week. Finished doxycycline. No fever. No new cough. Some shortness of breath at times, no worsening since ER visit, no further episodes since ER.  Anoro cost prohibitive. Off past year. Call with pharmacist tomorrow to look into patient assistance program On Eliquis, but also costly. About a week left. Will d/w pharmacist tomorrow.  Needs albuterol inhaler refilled - off for 1 year. Drinking more fluids.  Plans to start iron supplement.  Repeat labs done yesterday. Sodium improved. Creatinine improved. HGB 9.6-->9.1 (but volume depleted in ER).    Lab Results  Component Value Date   WBC 7.4 09/01/2022   HGB 9.1 (  L) 09/01/2022   HCT 27.0 (L) 09/01/2022   MCV 92.2 09/01/2022   PLT 266 09/01/2022   Lab Results  Component Value Date   NA 140 09/01/2022   K 3.9 09/01/2022   CL 105 09/01/2022   CO2 23 09/01/2022    Hypertension: Lisinopril hct 20/12.5mg   - 2 pills per day, no new side effects.   BP Readings from Last 3 Encounters:  09/02/22 122/70  08/25/22 113/69  08/20/22 127/74    Lab Results  Component Value Date   CREATININE 1.19 09/01/2022   GERD: Taking omeprazole daily - controlling heartburn.   Hyperlipidemia: Crestor 5mg  every day, ezetemibe 10mg  every day - no new side effects. Lab Results  Component Value Date   CHOL 169 10/21/2021   HDL 72 10/21/2021   LDLCALC 87 10/21/2021   TRIG 51 10/21/2021   CHOLHDL 2.3 10/21/2021   Lab Results  Component Value Date   ALT 17 09/01/2022   AST 16 09/01/2022   ALKPHOS 82 09/01/2022   BILITOT 0.2 (L) 09/01/2022         History Patient Active Problem List   Diagnosis Date Noted   Encounter for antineoplastic chemotherapy 08/18/2022   Encounter for antineoplastic immunotherapy 08/18/2022   Squamous cell carcinoma of lung, stage IV, left (HCC) 05/29/2022   Lung nodule 05/15/2022   Heart murmur 08/28/2021   Substance abuse (HCC) 08/28/2021   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   Bilateral sensorineural hearing loss 12/14/2018   Bilateral impacted cerumen 10/13/2018   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   Aortic stenosis 04/04/2018   Atherosclerosis of native arteries of the extremities with intermittent claudication 09/08/2013   Former moderate cigarette smoker (10-19 per day) 05/20/2012   Peripheral vascular disease with claudication    Hyperlipidemia    Aortic regurgitation    GERD (gastroesophageal reflux disease)    Hypertension    Past Medical History:  Diagnosis Date   Aortic regurgitation    Aortic stenosis 04/04/2018   Atherosclerosis of native arteries of the extremities with intermittent claudication 09/08/2013   Bilateral impacted cerumen 10/13/2018   Bilateral sensorineural hearing loss 12/14/2018   Dyspnea    Former moderate cigarette smoker (10-19 per day) 05/20/2012   Quit smoking Nov 2013 when hospitalized w/ HTN and chest pain(diagnosed w/ valvular heart disease)   GERD (gastroesophageal reflux disease)    Heart murmur    History of radiation therapy     Lumbar Spine, Thoracic Spine- 06/25/22-07/10/22- Dr. 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   Substance abuse [facility])    Past Surgical History:  Procedure Laterality Date   BRONCHIAL BIOPSY  05/26/2022   Procedure: BRONCHIAL BIOPSIES;  Surgeon: Josephine Igo, DO;  Location: [facility] ENDOSCOPY;  Service: Pulmonary;;   BRONCHIAL BRUSHINGS  05/26/2022   Procedure: BRONCHIAL BRUSHINGS;  Surgeon: Josephine Igo, DO;  Location: [facility] ENDOSCOPY;  Service: Pulmonary;;   NO PAST SURGERIES     VIDEO BRONCHOSCOPY  05/26/2022   Procedure: VIDEO BRONCHOSCOPY WITHOUT FLUORO;  Surgeon: Josephine Igo, DO;  Location: [facility] ENDOSCOPY;  Service: Pulmonary;;   Allergies  Allergen Reactions   Lipitor [Atorvastatin]     myalgia   Pravastatin Other (See Comments)    myalgia   Prior to Admission medications   Medication Sig Start Date End Date Taking? Authorizing  Provider  albuterol (VENTOLIN HFA) 108 (90 Base) MCG/ACT inhaler Inhale 2 puffs into the lungs every 6 (six) hours as needed for wheezing or shortness of breath. Patient not taking: Reported on 08/17/2022 12/27/20   Glenford Bayley, NP  apixaban (ELIQUIS) 5 MG TABS tablet Take 1 tablet (5 mg total) by mouth 2 (two) times daily. 08/18/22   Heilingoetter, Cassandra L, PA-C  APIXABAN (ELIQUIS) VTE STARTER PACK (10MG  AND 5MG ) Take as directed on package: start with two-5mg  tablets twice daily for 7 days. On day 8, switch to one-5mg  tablet twice daily. 08/06/22   Si Gaul, MD  Ascorbic Acid (VITAMIN C PO) Take 1 tablet by mouth daily.    [provider]  Cholecalciferol (VITAMIN D3) 1000 UNITS CAPS Take 1,000 Units by mouth daily.    [provider]  Coenzyme Q10 (CO Q 10 PO) Take 1 tablet by mouth daily.    [provider]  Cyanocobalamin (VITAMIN B-12 PO) Take 1 tablet by  mouth daily.    [provider]  cyclobenzaprine (FLEXERIL) 5 MG tablet Take 1 tablet (5 mg total) by mouth 3 (three) times daily as needed for muscle spasms. 06/18/22   Pickenpack-Cousar, Arty Baumgartner, NP  docusate sodium (COLACE) 100 MG capsule Take 1 capsule (100 mg total) by mouth every 12 (twelve) hours. Patient not taking: Reported on 08/17/2022 06/07/22   Renne Crigler, PA-C  ezetimibe (ZETIA) 10 MG tablet Take 1 tablet (10 mg total) by mouth daily. 09/04/21   Baldo Daub, MD  fluticasone (FLONASE) 50 MCG/ACT nasal spray Place 1-2 sprays into both nostrils daily. 04/24/20   [name], MD  KRILL OIL PO Take 1 tablet by mouth daily.    [provider]  lisinopril-hydrochlorothiazide (ZESTORETIC) 20-12.5 MG tablet Take 2 tablets by mouth once daily 06/08/22   [name], MD  MAGNESIUM PO Take 1 tablet by mouth daily.    [provider]  metoCLOPramide (REGLAN) 10 MG tablet Take 1 tablet (10 mg total) by mouth 4 (four) times daily -  before meals and at bedtime. 07/20/22   Pickenpack-Cousar, Arty Baumgartner, NP  morphine (MS CONTIN) 15 MG 12 hr tablet Take 1 tablet (15 mg total) by mouth every 12 (twelve) hours. 08/09/22   Pickenpack-Cousar, Arty Baumgartner, NP  Multiple Vitamins-Minerals (CENTRUM SILVER PO) Take 1 tablet by mouth daily.    [provider]  Multiple Vitamins-Minerals (ZINC PO) Take 1 tablet by mouth daily.    [provider]  Naphazoline-Pheniramine (OPCON-A) 0.027-0.315 % SOLN Place 1 drop into both eyes daily as needed (redness).    [provider]  nicotine (NICODERM CQ - DOSED IN MG/24 HOURS) 14 mg/24hr patch 14mg /day for 6 weeks then 7mg /day x 2 weeks Patient not taking: Reported on 08/17/2022 08/19/20   Glenford Bayley, NP  omeprazole (PRILOSEC) 20 MG capsule Take 1 capsule (20 mg total) by mouth daily. 08/17/22   [name], MD  oxyCODONE (OXY IR/ROXICODONE) 5 MG immediate release tablet Take 1 tablet (5 mg total) by mouth  every 4 (four) hours as needed for severe pain. 08/09/22   Pickenpack-Cousar, Arty Baumgartner, NP  prochlorperazine (COMPAZINE) 10 MG tablet Take 1 tablet (10 mg total) by mouth every 6 (six) hours as needed for nausea or vomiting. 06/09/22   Si Gaul, MD  rosuvastatin (CRESTOR) 5 MG tablet Take 1 tablet (5 mg total) by mouth at bedtime. 08/17/22   [name], MD  sucralfate (CARAFATE) 1 g tablet  Take 1 tablet (1 g total) by mouth 4 (four) times daily -  with meals and at bedtime. Crush and dissolve in 10 mL of warm water prior to swallowing, take 20 to 30 minutes before meals Patient not taking: Reported on 09/01/2022 07/16/22   Antony Blackbird, MD  TURMERIC PO Take 1 tablet by mouth 3 (three) times a week.    [provider]  umeclidinium-vilanterol (ANORO ELLIPTA) 62.5-25 MCG/ACT AEPB Inhale 1 puff into the lungs daily. Patient not taking: Reported on 08/17/2022 01/30/21   Josephine Igo, DO   Social History   Socioeconomic History   Marital status: Married    Spouse name: [name]   Number of children: Not on file   Years of education: Not on file   Highest education level: 12th grade  Occupational History   Occupation: Recruitment consultant  Tobacco Use   Smoking status: Former    Current packs/day: 0.00    Average packs/day: 1 pack/day for 30.0 years (30.0 ttl pk-yrs)    Types: Cigarettes    Start date: 12/20/1981    Quit date: 12/21/2011    Years since quitting: 10.7   Smokeless tobacco: Never  Vaping Use   Vaping status: Every Day  Substance and Sexual Activity   Alcohol use: Yes    Comment: Ocassionaly.    Drug use: Yes    Frequency: 1.0 times per week    Types: Marijuana   Sexual activity: Yes    Birth control/protection: None  Other Topics Concern   Not on file  Social History Narrative   Married. Education: [employer]. Exercise:  "Somewhat".   Social Determinants of Health   Financial Resource Strain: Medium Risk (09/01/2022)   Overall Financial Resource  Strain (CARDIA)    Difficulty of Paying Living Expenses: Somewhat hard  Food Insecurity: Unknown (09/01/2022)   Hunger Vital Sign    Worried About Running Out of Food in the Last Year: Patient declined    Ran Out of Food in the Last Year: Never true  Transportation Needs: No Transportation Needs (09/01/2022)   PRAPARE - Administrator, Civil Service (Medical): No    Lack of Transportation (Non-Medical): No  Physical Activity: Inactive (09/01/2022)   Exercise Vital Sign    Days of Exercise per Week: 0 days    Minutes of Exercise per Session: 0 min  Stress: No Stress Concern Present (09/01/2022)   [employer] of Occupational Health - Occupational Stress Questionnaire    Feeling of Stress : Only a little  Social Connections: Socially Integrated (09/01/2022)   Social Connection and Isolation Panel [NHANES]    Frequency of Communication with Friends and Family: More than three times a week    Frequency of Social Gatherings with Friends and Family: Once a week    Attends Religious Services: More than 4 times per year    Active Member of [employer] or Organizations: Yes    Attends Banker Meetings: More than 4 times per year    Marital Status: Married  Catering manager Violence: Not At Risk (06/17/2022)   Humiliation, Afraid, Rape, and Kick questionnaire    Fear of Current or Ex-Partner: No    Emotionally Abused: No    Physically Abused: No    Sexually Abused: No    Review of Systems As above.   Objective:   Vitals:   09/02/22 1412  BP: 122/70  Pulse: 76  Temp: 98.1 F (36.7 C)  TempSrc: Temporal  SpO2: 98%  Weight:  142 lb (64.4 kg)  Height: 5\' 9"  (1.753 m)     Physical Exam Vitals reviewed.  Constitutional:      General: He is not in acute distress.    Appearance: He is well-developed. He is not ill-appearing, toxic-appearing or diaphoretic.  HENT:     Head: Normocephalic and atraumatic.  Neck:     Vascular: No carotid bruit or JVD.   Cardiovascular:     Rate and Rhythm: Normal rate and regular rhythm.     Heart sounds: Normal heart sounds. No murmur heard. Pulmonary:     Effort: Pulmonary effort is normal.     Breath sounds: Rhonchi (Few faint coarse breath sounds left lower lobe, normal effort, no distress.) present.  Musculoskeletal:     Right lower leg: No edema.     Left lower leg: No edema.  Skin:    General: Skin is warm and dry.  Neurological:     Mental Status: He is alert and oriented to person, place, and time.  Psychiatric:        Mood and Affect: Mood normal.    45 minutes spent during visit, including chart review including ER visit, discussion of recent acute and chronic medical issues, counseling and assimilation of information, exam, discussion of plan, and chart completion.    Assessment & Plan:  Seabron Iannello. is a 67 y.o. male . Squamous cell carcinoma of lung, stage IV, left (HCC) - Plan: albuterol (VENTOLIN HFA) 108 (90 Base) MCG/ACT inhaler Panlobular emphysema (HCC) - Plan: albuterol (VENTOLIN HFA) 108 (90 Base) MCG/ACT inhaler Pneumonia of right lower lobe due to infectious organism  Under care of oncology, immunotherapy planned.  Recent community-acquired pneumonia likely, improved after antibiotics, reassuring exam.  Symptomatically improved.  Deferred imaging at this time with RTC precautions.  -Will be meeting with pharmacist to review medication assistance program for Anoro, albuterol refilled with ER precautions if frequent use/need.  -Continues on Eliquis with history of pulmonary embolus, also to discuss medication with  pharmacy regarding cost.  Hyperlipidemia, unspecified hyperlipidemia type - Plan: rosuvastatin (CRESTOR) 5 MG tablet, ezetimibe (ZETIA) 10 MG tablet  -Tolerating Zetia, continue same, lipid panel at next visit.  Gastroesophageal reflux disease, unspecified whether esophagitis present - Plan: omeprazole (PRILOSEC) 20 MG capsule  -Stable with omeprazole, continue  same.  Essential hypertension - Plan: lisinopril-hydrochlorothiazide (ZESTORETIC) 20-12.5 MG tablet  -Stable with current regimen, continue same, recent labs noted  Meds ordered this encounter  Medications   albuterol (VENTOLIN HFA) 108 (90 Base) MCG/ACT inhaler    Sig: Inhale 2 puffs into the lungs every 6 (six) hours as needed for wheezing or shortness of breath.    Dispense:  8 g    Refill:  6   lisinopril-hydrochlorothiazide (ZESTORETIC) 20-12.5 MG tablet    Sig: Take 2 tablets by mouth daily.    Dispense:  180 tablet    Refill:  1   omeprazole (PRILOSEC) 20 MG capsule    Sig: Take 1 capsule (20 mg total) by mouth daily.    Dispense:  90 capsule    Refill:  1   rosuvastatin (CRESTOR) 5 MG tablet    Sig: Take 1 tablet (5 mg total) by mouth at bedtime.    Dispense:  90 tablet    Refill:  1   ezetimibe (ZETIA) 10 MG tablet    Sig: Take 1 tablet (10 mg total) by mouth daily.    Dispense:  90 tablet    Refill:  3  Patient Instructions  Thank you for coming in today.  I am glad you have improved since the ER visit.  Continue to stay hydrated, try over-the-counter iron supplement once per day, and keep follow-up with the pharmacist tomorrow to look into options for your Eliquis and Anoro.  I did refill the albuterol and some of your other chronic medications.  We can check cholesterol levels at next visit as blood work yesterday was overall reassuring.  Please let me know if I can be of any further assistance and take care.         Signed,   Meredith Staggers, MD [facility], [facility] 09/02/22 2:36 PM

## 2022-09-02 NOTE — Telephone Encounter (Signed)
Received below order / request to assist patient with cost of medications.  Tried to call to make appt with Clinical Pharmacist but no answer. LM on VM with CB# [phone]  Henrene Pastor, PharmD Clinical Pharmacist  Date Department Ordering Authorizing  09/01/2022 [facility] [employer] Tousey, Shela Leff, RN [name], MD    Order Questions  Question Answer  Reason for Referral: Pharmacy  Reason for Consult Medication Assistance  Expected date of contact Urgent - 7 Days       Comments  Please call patient to discuss options for PAP for Eliquis-- he reports great difficulty in being able to afford, stated it costs him > $400.00/month out of pocket; he also did not (ever) fill the currently prescribed Anoro Elipta maintenance inhaler- said he could not afford it, it was >$500.00 when he went to pick it up.  Finally, he is completely out of his rescue inhaler-- I have made PCP aware of same/ need for new Rx for albuterol inhaler Pls call/ msg if questions, Caryl Pina, RN, BSN, CCRN Alumnus RN CM Care Coordination/ Transition of Care- [facility] Care Management [phone]: direct office

## 2022-09-03 ENCOUNTER — Telehealth: Payer: Self-pay | Admitting: Pharmacy Technician

## 2022-09-03 DIAGNOSIS — Z5986 Financial insecurity: Secondary | ICD-10-CM

## 2022-09-03 NOTE — Progress Notes (Signed)
[facility] Customer service manager [facility])  [facility] Quality Pharmacy Team   09/03/2022  Albert Hardy. [birth year] [id number]  Reason for referral: Medication assistance for Eliquis and Anoro  Referral source: [facility] RN [name] Current insurance:  [employer]  Outreach:  Successful telephone call with patient.  HIPAA identifiers verified. Patient informs he is a household of 2, files taxes and income is approximately 391%FPL which puts patient over income for the [employer] and [employer] patient assistance programs.   Medication Assistance Findings:  Medication assistance needs identified: Eliquis, Anoro  Extra Help:  Not eligible for Extra Help Low Income Subsidy based on reported income and assets  Additional medication assistance options reviewed with patient as warranted:  No other options identified  Plan: Will close [facility] pharmacy case as no further medication needs identified at this time.  Am happy to assist in the future as needed.  Note: Will route this note to [facility] PharmD [name] as she is involved in patient care as well.  Thank you for allowing [facility] pharmacy to be a part of this patient's care.   [name], CPhT   [facility] Office: [phone] Fax: [phone] Email: [email or web address]

## 2022-09-04 ENCOUNTER — Encounter: Payer: Self-pay | Admitting: Pharmacist

## 2022-09-04 NOTE — Progress Notes (Unsigned)
   09/04/2022 Name: Mackinley Bullen. MRN: [id number] DOB: 1955/09/23  Chief Complaint  Patient presents with   Medication Management    Medication access / cost    Cleophus Wills. is a 67 y.o. year old male who presented for a telephone visit.   They were referred to the pharmacist by their PCP for assistance in managing medication access.    Subjective: Medication Access/Adherence   Patient reports affordability concerns with their medications: Yes  Patient reports access/transportation concerns to their pharmacy: No  Patient reports adherence concerns with their medications:  Yes  due to cost  [facility] technician Noreene Larsson Simcox spoke with patient 09/04/2022. Screened for medication assistance program for Eliquis (Bistol [employer]) and [employer] ([employer]). Unfortunately patient did not qualify for these programs due to household income above cut off.    Objective:  Lab Results  Component Value Date   HGBA1C 5.7 (H) 04/29/2020    Lab Results  Component Value Date   CREATININE 1.19 09/01/2022   BUN 23 09/01/2022   NA 140 09/01/2022   K 3.9 09/01/2022   CL 105 09/01/2022   CO2 23 09/01/2022    Lab Results  Component Value Date   CHOL 169 10/21/2021   HDL 72 10/21/2021   LDLCALC 87 10/21/2021   TRIG 51 10/21/2021   CHOLHDL 2.3 10/21/2021    Medications Reviewed Today   Medications were not reviewed in this encounter       Assessment/Plan:  Non Small Cell LUng Cancer, COPD and afib  - having difficulty affording medication   I reached out to patient to see if I could get more information about his Silver Script Med D plan. Would like to see if there is possible a lower cost alternative to Anoro inhaler and also check for low cost rescue inhaler. Unable to reach patient. LM on VM.   Checked [employer] Funds for COPD but it is closed. Also checked for non Small cell lung cancer funds with the following assistance programs but the non small cell lung cancer  programs were closed.  Also they usually only cover chemo / cancer treatment not supportive therapy.  PAN Foundation [employer] Co-Payment Assistance Foundation Good Days HealthWell Foundation Patient Advocate Foundation Co-Pay Relief The Assistance Fund    Will continue to try to reach patient to get Silver Scripts info and identify potential alternative to Anoro.    ***

## 2022-09-04 NOTE — Telephone Encounter (Signed)
[facility] technician Noreene Larsson Simcox spoke with patient 09/04/2022. Screened for medication assistance program for Eliquis (Bistol [employer]) and [employer] (Glaxo Kevan Ny). Unfortunately patient did not qualify for these programs due to household income above cut off.  I reached out to patient to see if I could get more information about his Silver Script Med D plan. Would like to see if there is possible a lower cost alternative to Anoro inhaler and also check for low cost rescue inhaler.   Unable to reach patient. LM on VM.   Checked Healthwell Funds for COPD but it is closed. Also checked for non Small cell lung cancer funds with the following assistance programs but the non small cell lung cancer programs were closed.   PAN Foundation [employer] Co-Payment Assistance Foundation Good Days HealthWell Foundation Patient Advocate Foundation Co-Pay Relief The Assistance Fund   I also reached out to [facility] pharmacy to see if possibly [facility] had any assistance programs for lung cancer patients but was not able to identify such a program.   Hopefully can get more specific info from patient about his Silver Scripts plan and find lower cost alternative to at least Anoro.

## 2022-09-07 ENCOUNTER — Telehealth: Payer: Self-pay

## 2022-09-07 NOTE — Progress Notes (Unsigned)
Palliative Medicine [facility]  [phone] Fax:([phone]   Name: Albert Hardy. Date: 09/07/2022 MRN: [id number]  DOB: Feb 14, 1955  Patient Care Team: [name], MD as PCP - General (Family Medicine)    INTERVAL HISTORY: Albert Hardy. is a 67 y.o. 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 Palliative ask to see for symptom management and goals of care   SOCIAL HISTORY:     reports that he quit smoking about 10 years ago. His smoking use included cigarettes. He started smoking about 40 years ago. He has a 30 pack-year smoking history. He has never used smokeless tobacco. He reports current alcohol use. He reports current drug use. Frequency: 1.00 time per week. Drug: Marijuana.  ADVANCE DIRECTIVES:  None on file  CODE STATUS: Full code  PAST MEDICAL HISTORY: Past Medical History:  Diagnosis Date   Aortic regurgitation    Aortic stenosis 04/04/2018   Atherosclerosis of native arteries of the extremities with intermittent claudication 09/08/2013   Bilateral impacted cerumen 10/13/2018   Bilateral sensorineural hearing loss 12/14/2018   Dyspnea    Former moderate cigarette smoker (10-19 per day) 05/20/2012   Quit smoking Nov 2013 when hospitalized w/ HTN and chest pain(diagnosed w/ valvular heart disease)   GERD (gastroesophageal reflux disease)    Heart murmur    History of radiation therapy    Lumbar Spine, Thoracic Spine- 06/25/22-07/10/22- Dr. 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  MEDICATIONS:  Current Outpatient Medications  Medication Sig Dispense Refill   albuterol (VENTOLIN HFA) 108 (90 Base)  MCG/ACT inhaler Inhale 2 puffs into the lungs every 6 (six) hours as needed for wheezing or shortness of breath. 8 g 6   apixaban (ELIQUIS) 5 MG TABS tablet Take 1 tablet (5 mg total) by mouth 2 (two) times daily. 60 tablet 2   APIXABAN (ELIQUIS) VTE STARTER PACK (10MG  AND 5MG ) Take as directed on package: start with two-5mg  tablets twice daily for 7 days. On day 8, switch to one-5mg  tablet twice daily. 74 each 0   Ascorbic Acid (VITAMIN C PO) Take 1 tablet by mouth daily.     Cholecalciferol (VITAMIN D3) 1000 UNITS CAPS Take 1,000 Units by mouth daily.     Coenzyme Q10 (CO Q 10 PO) Take 1 tablet by mouth daily.     Cyanocobalamin (VITAMIN B-12 PO) Take 1 tablet by mouth daily.     cyclobenzaprine (FLEXERIL) 5 MG tablet Take 1 tablet (5 mg total) by mouth 3 (three) times daily as needed for muscle spasms. 30 tablet 0   ezetimibe (ZETIA) 10 MG tablet Take 1 tablet (10 mg total) by mouth daily. 90 tablet 3   fluticasone (FLONASE) 50 MCG/ACT nasal spray Place 1-2 sprays into both nostrils daily. 16 g 6   KRILL OIL PO Take 1 tablet by mouth daily.     lisinopril-hydrochlorothiazide (ZESTORETIC) 20-12.5 MG tablet Take 2 tablets by mouth daily. 180 tablet 1   MAGNESIUM PO Take 1 tablet by mouth daily.     metoCLOPramide (REGLAN) 10 MG tablet Take 1 tablet (10 mg total) by mouth 4 (four) times daily -  before  meals and at bedtime. 56 tablet 0   morphine (MS CONTIN) 15 MG 12 hr tablet Take 1 tablet (15 mg total) by mouth every 12 (twelve) hours. 60 tablet 0   Multiple Vitamins-Minerals (CENTRUM SILVER PO) Take 1 tablet by mouth daily.     Multiple Vitamins-Minerals (ZINC PO) Take 1 tablet by mouth daily.     Naphazoline-Pheniramine (OPCON-A) 0.027-0.315 % SOLN Place 1 drop into both eyes daily as needed (redness).     omeprazole (PRILOSEC) 20 MG capsule Take 1 capsule (20 mg total) by mouth daily. 90 capsule 1   oxyCODONE (OXY IR/ROXICODONE) 5 MG immediate release tablet Take 1 tablet (5 mg total) by  mouth every 4 (four) hours as needed for severe pain. 90 tablet 0   prochlorperazine (COMPAZINE) 10 MG tablet Take 1 tablet (10 mg total) by mouth every 6 (six) hours as needed for nausea or vomiting. 30 tablet 0   rosuvastatin (CRESTOR) 5 MG tablet Take 1 tablet (5 mg total) by mouth at bedtime. 90 tablet 1   TURMERIC PO Take 1 tablet by mouth 3 (three) times a week.     umeclidinium-vilanterol (ANORO ELLIPTA) 62.5-25 MCG/ACT AEPB Inhale 1 puff into the lungs daily. (Patient not taking: Reported on 08/17/2022) 180 each 3   No current facility-administered medications for this visit.    VITAL SIGNS: There were no vitals taken for this visit. There were no vitals filed for this visit.  Estimated body mass index is 20.97 kg/m as calculated from the following:   Height as of 09/02/22: 5\' 9"  (1.753 m).   Weight as of 09/02/22: 142 lb (64.4 kg).     Latest Ref Rng & Units 09/01/2022    2:02 PM 08/25/2022    3:51 PM 08/25/2022    2:07 PM  CBC  WBC 4.0 - 10.5 K/uL 7.4  8.7  9.8   Hemoglobin 13.0 - 17.0 g/dL 9.1  9.6  9.7   Hematocrit 39.0 - 52.0 % 27.0  29.3  28.6   Platelets 150 - 400 K/uL 266  160  155        Latest Ref Rng & Units 09/01/2022    2:02 PM 08/25/2022    3:51 PM 08/25/2022    2:07 PM  CMP  Glucose 70 - 99 mg/dL 99  161  096   BUN 8 - 23 mg/dL 23  31  29    Creatinine 0.61 - 1.24 mg/dL 0.45  4.09  8.11   Sodium 135 - 145 mmol/L 140  132  135   Potassium 3.5 - 5.1 mmol/L 3.9  4.3  4.3   Chloride 98 - 111 mmol/L 105  100  100   CO2 22 - 32 mmol/L 23  22  21    Calcium 8.9 - 10.3 mg/dL 9.5  8.7  9.4   Total Protein 6.5 - 8.1 g/dL 7.3  7.6  7.9   Total Bilirubin 0.3 - 1.2 mg/dL 0.2  0.6  0.4   Alkaline Phos 38 - 126 U/L 82  89  99   AST 15 - 41 U/L 16  22  17    ALT 0 - 44 U/L 17  28  23      PERFORMANCE STATUS (ECOG) : 1 - Symptomatic but completely ambulatory   Physical Exam General: NAD Cardiovascular: RRR Pulmonary: normal breathing pattern  Extremities: no edema, no  joint deformities Skin: no rashes Neurological: AAO x4  IMPRESSION:  I saw [name] during his infusion today. No acute  distress. Blood pressure soft however he is asymptomatic. States he feels fine. Denies nausea, vomiting, or diarrhea. Denies dizziness or lightheadedness. Some fatigue however continues to take things one day at a time.   Neoplasm related pain Mr. Chiong reports his pain is well-controlled on current regimen.  Some days are better than others.     We discussed his regimen at length. He is currently taking MS Contin 15 mg every 12 hours, Oxy IR 5mg  every 4-6  hours as needed for breakthrough pain.  Flexeril as needed for spasms.    No changes to current regimen at this time.  Will continue to closely monitor.   Constipation Leah reports some improvement in his constipation however still a challenge. He is taking colace twice daily. I again encouraged him to begin taking Senna 2 tabs twice daily and Miralax.   3. Decreased appetite/Weight loss  Mr. Marone states his appetite is improving. He is much appreciative of this. Current weight is 143lbs.   4. Goals of Care   06/18/22-  We discussed his current illness and what it means in the larger context of hsi on-going co-morbidities. Natural disease trajectory and expectations were discussed.  Mr. Mayhue and his wife are realistic in their expectations and understanding of his incurable cancer. He knows all treatment is palliative focused. Wishes to continue taking things one day at a time focusing on his quality of life allowing him every opportunity to continue to thrive.   We discussed Her current illness and what it means in the larger context of Her on-going co-morbidities. Natural disease trajectory and expectations were discussed.  I discussed the importance of continued conversation with family and their medical providers regarding overall plan of care and treatment options, ensuring decisions are within the context of  the patients values and GOCs.  PLAN:  Pepcid 20 mg to twice daily Oxy IR 5mg  every 4-6 hours as needed for breakthrough pain  MS Contin 15 mg every 12 hours.  Tolerating well. Flexeril as needed Senna 2 tabs twice daily  Miralax daily  Education provided on importance of bowel regimen.  Pain contract on file I will plan to see patient back in 3-4 weeks in collaboration to other oncology appointments.     Patient expressed understanding and was in agreement with this plan. He also understands that He can call the clinic at any time with any questions, concerns, or complaints.   Any controlled substances utilized were prescribed in the context of palliative care. PDMP has been reviewed.    Visit consisted of counseling and education dealing with the complex and emotionally intense issues of symptom management and palliative care in the setting of serious and potentially life-threatening illness.Greater than 50%  of this time was spent counseling and coordinating care related to the above assessment and plan.  Willette Alma, AGPCNP-BC  Palliative Medicine Team/[facility]  *Please note that this is a verbal dictation therefore any spelling or grammatical errors are due to the "Dragon [employer] One" system interpretation.

## 2022-09-07 NOTE — Progress Notes (Signed)
   Care Guide Note  09/07/2022 Name: Elizar Gleissner. MRN: [id number] DOB: 08-28-1955  Referred by: [name], MD Reason for referral : No chief complaint on file.   Juwaan Spolar. is a 67 y.o. year old male who is a primary care patient of [name], MD. [name] was referred to the pharmacist for assistance related to HTN and HLD.    Successful contact was made with the patient to discuss pharmacy services including being ready for the pharmacist to call at least 5 minutes before the scheduled appointment time, to have medication bottles and any blood sugar or blood pressure readings ready for review. The patient agreed to meet with the pharmacist via with the pharmacist via telephone visit on (date/time).  09/15/2022  Penne Lash, RMA Care Guide [facility]  [place], [place] [address] Direct Dial: [phone] [email or web address]

## 2022-09-08 ENCOUNTER — Encounter: Payer: Self-pay | Admitting: Nurse Practitioner

## 2022-09-08 ENCOUNTER — Inpatient Hospital Stay (HOSPITAL_BASED_OUTPATIENT_CLINIC_OR_DEPARTMENT_OTHER): Payer: Medicare Other | Admitting: Nurse Practitioner

## 2022-09-08 ENCOUNTER — Inpatient Hospital Stay (HOSPITAL_BASED_OUTPATIENT_CLINIC_OR_DEPARTMENT_OTHER): Payer: Medicare Other | Admitting: Internal Medicine

## 2022-09-08 ENCOUNTER — Other Ambulatory Visit: Payer: Self-pay

## 2022-09-08 ENCOUNTER — Inpatient Hospital Stay: Payer: Medicare Other

## 2022-09-08 VITALS — BP 111/42 | HR 56 | Resp 18

## 2022-09-08 VITALS — BP 98/64 | HR 94 | Temp 98.0°F | Resp 16 | Ht 69.0 in | Wt 143.0 lb

## 2022-09-08 DIAGNOSIS — Z5112 Encounter for antineoplastic immunotherapy: Secondary | ICD-10-CM | POA: Diagnosis not present

## 2022-09-08 DIAGNOSIS — C3492 Malignant neoplasm of unspecified part of left bronchus or lung: Secondary | ICD-10-CM

## 2022-09-08 DIAGNOSIS — G893 Neoplasm related pain (acute) (chronic): Secondary | ICD-10-CM

## 2022-09-08 DIAGNOSIS — Z515 Encounter for palliative care: Secondary | ICD-10-CM

## 2022-09-08 DIAGNOSIS — R634 Abnormal weight loss: Secondary | ICD-10-CM

## 2022-09-08 DIAGNOSIS — R530 Neoplastic (malignant) related fatigue: Secondary | ICD-10-CM

## 2022-09-08 DIAGNOSIS — R630 Anorexia: Secondary | ICD-10-CM | POA: Diagnosis not present

## 2022-09-08 LAB — CBC WITH DIFFERENTIAL (CANCER CENTER ONLY)
Abs Immature Granulocytes: 0.02 10*3/uL (ref 0.00–0.07)
Basophils Absolute: 0 10*3/uL (ref 0.0–0.1)
Basophils Relative: 0 %
Eosinophils Absolute: 0 10*3/uL (ref 0.0–0.5)
Eosinophils Relative: 1 %
HCT: 28.5 % — ABNORMAL LOW (ref 39.0–52.0)
Hemoglobin: 9.2 g/dL — ABNORMAL LOW (ref 13.0–17.0)
Immature Granulocytes: 1 %
Lymphocytes Relative: 26 %
Lymphs Abs: 0.9 10*3/uL (ref 0.7–4.0)
MCH: 30.6 pg (ref 26.0–34.0)
MCHC: 32.3 g/dL (ref 30.0–36.0)
MCV: 94.7 fL (ref 80.0–100.0)
Monocytes Absolute: 0.6 10*3/uL (ref 0.1–1.0)
Monocytes Relative: 15 %
Neutro Abs: 2.1 10*3/uL (ref 1.7–7.7)
Neutrophils Relative %: 57 %
Platelet Count: 244 10*3/uL (ref 150–400)
RBC: 3.01 MIL/uL — ABNORMAL LOW (ref 4.22–5.81)
RDW: 19 % — ABNORMAL HIGH (ref 11.5–15.5)
WBC Count: 3.6 10*3/uL — ABNORMAL LOW (ref 4.0–10.5)
nRBC: 0 % (ref 0.0–0.2)

## 2022-09-08 LAB — CMP (CANCER CENTER ONLY)
ALT: 19 U/L (ref 0–44)
AST: 17 U/L (ref 15–41)
Albumin: 4 g/dL (ref 3.5–5.0)
Alkaline Phosphatase: 58 U/L (ref 38–126)
Anion gap: 11 (ref 5–15)
BUN: 20 mg/dL (ref 8–23)
CO2: 26 mmol/L (ref 22–32)
Calcium: 9.4 mg/dL (ref 8.9–10.3)
Chloride: 103 mmol/L (ref 98–111)
Creatinine: 1.1 mg/dL (ref 0.61–1.24)
GFR, Estimated: >60 mL/min (ref >60)
Glucose, Bld: 100 mg/dL — ABNORMAL HIGH (ref 70–99)
Potassium: 3.7 mmol/L (ref 3.5–5.1)
Sodium: 140 mmol/L (ref 135–145)
Total Bilirubin: 0.3 mg/dL (ref 0.3–1.2)
Total Protein: 7.3 g/dL (ref 6.5–8.1)

## 2022-09-08 MED ORDER — SODIUM CHLORIDE 0.9 % IV SOLN
350.0000 mg | Freq: Once | INTRAVENOUS | Status: AC
  Administered 2022-09-08: 350 mg via INTRAVENOUS
  Filled 2022-09-08: qty 7

## 2022-09-08 MED ORDER — SODIUM CHLORIDE 0.9 % IV SOLN: Freq: Once | INTRAVENOUS | Status: AC

## 2022-09-08 MED ORDER — SODIUM CHLORIDE 0.9 % IV SOLN: INTRAVENOUS | Status: AC

## 2022-09-08 NOTE — Progress Notes (Signed)
BP111/42 after 700cc IVF. Pt desires to leave and not finish the final 300 cc of IVF

## 2022-09-08 NOTE — Progress Notes (Signed)
[facility] [phone]   [phone]  OFFICE PROGRESS NOTE  Albert Flood, MD [address] [place] [address]  DIAGNOSIS: Stage IV (T2a, N2, M1 B) non-small cell lung cancer, squamous cell carcinoma presented with left upper lobe lung mass in addition to AP window lymphadenopathy and suspicious bone metastasis to the T8 and L4 vertebrae in addition to retroperitoneal metastatic soft tissue nodule diagnosed in April 2024.   Molecular studies by [id number] showed no actionable mutations and PD-L1 expression of 70%.  PRIOR THERAPY:  1) Palliative radiotherapy to the T8 and L4 metastatic bone disease. 2) ystemic chemotherapy with carboplatin for AUC of 5, paclitaxel 175 Mg/M2 and Libtayo (Cempilimab) 350 Mg IV every 3 weeks with Neulasta support for 4 cycles    CURRENT THERAPY: Maintenance treatment with single agent Libtayo (Cempilimab) 350 Mg IV every 3 weeks.  First dose September 08, 2022.  INTERVAL HISTORY: Albert Hardy. 67 y.o. male returns to the clinic today for follow-up visit.  The patient is feeling fine today with no concerning complaints except for mild fatigue.  He denied having any current chest pain, shortness of breath, cough or hemoptysis.  He has no nausea, vomiting, diarrhea or constipation.  He has no headache or visual changes.  He denied having any recent weight loss or night sweats.  He is here today to start the first cycle of maintenance treatment with single agent Libtayo (Cempilimab).  MEDICAL HISTORY: Past Medical History:  Diagnosis Date   Aortic regurgitation    Aortic stenosis 04/04/2018   Atherosclerosis of native arteries of the extremities with intermittent claudication 09/08/2013   Bilateral impacted cerumen 10/13/2018   Bilateral sensorineural hearing loss 12/14/2018   Dyspnea    Former moderate cigarette smoker (10-19 per day) 05/20/2012   Quit smoking Nov 2013 when hospitalized w/ HTN and chest  pain(diagnosed w/ valvular heart disease)   GERD (gastroesophageal reflux disease)    Heart murmur    History of radiation therapy    Lumbar Spine, Thoracic Spine- 06/25/22-07/10/22- Dr. 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  MEDICATIONS:  Current Outpatient Medications  Medication Sig Dispense Refill   albuterol (VENTOLIN HFA) 108 (90 Base) MCG/ACT inhaler Inhale 2 puffs into the lungs every 6 (six) hours as needed for wheezing or shortness of breath. 8 g 6   apixaban (ELIQUIS) 5 MG TABS tablet Take 1 tablet (5 mg total) by mouth 2 (two) times daily. 60 tablet 2   APIXABAN (ELIQUIS) VTE STARTER PACK (10MG  AND 5MG ) Take as directed on package: start with two-5mg  tablets twice daily for 7 days. On day 8, switch to one-5mg  tablet twice daily. 74 each 0   Ascorbic Acid (VITAMIN C PO) Take 1 tablet by mouth daily.     Cholecalciferol (VITAMIN D3) 1000 UNITS CAPS Take 1,000 Units by mouth daily.     Coenzyme Q10 (CO Q 10 PO) Take 1 tablet by mouth daily.     Cyanocobalamin (VITAMIN B-12 PO) Take 1 tablet by mouth daily.     cyclobenzaprine (FLEXERIL) 5 MG tablet Take 1 tablet (5 mg total) by mouth 3 (three) times daily as needed for muscle  spasms. 30 tablet 0   ezetimibe (ZETIA) 10 MG tablet Take 1 tablet (10 mg total) by mouth daily. 90 tablet 3   fluticasone (FLONASE) 50 MCG/ACT nasal spray Place 1-2 sprays into both nostrils daily. 16 g 6   KRILL OIL PO Take 1 tablet by mouth daily.     lisinopril-hydrochlorothiazide (ZESTORETIC) 20-12.5 MG tablet Take 2 tablets by mouth daily. 180 tablet 1   MAGNESIUM PO Take 1 tablet by mouth daily.     metoCLOPramide (REGLAN) 10 MG tablet Take 1 tablet (10 mg total) by mouth 4 (four) times daily -   before meals and at bedtime. 56 tablet 0   morphine (MS CONTIN) 15 MG 12 hr tablet Take 1 tablet (15 mg total) by mouth every 12 (twelve) hours. 60 tablet 0   Multiple Vitamins-Minerals (CENTRUM SILVER PO) Take 1 tablet by mouth daily.     Multiple Vitamins-Minerals (ZINC PO) Take 1 tablet by mouth daily.     Naphazoline-Pheniramine (OPCON-A) 0.027-0.315 % SOLN Place 1 drop into both eyes daily as needed (redness).     omeprazole (PRILOSEC) 20 MG capsule Take 1 capsule (20 mg total) by mouth daily. 90 capsule 1   oxyCODONE (OXY IR/ROXICODONE) 5 MG immediate release tablet Take 1 tablet (5 mg total) by mouth every 4 (four) hours as needed for severe pain. 90 tablet 0   prochlorperazine (COMPAZINE) 10 MG tablet Take 1 tablet (10 mg total) by mouth every 6 (six) hours as needed for nausea or vomiting. 30 tablet 0   rosuvastatin (CRESTOR) 5 MG tablet Take 1 tablet (5 mg total) by mouth at bedtime. 90 tablet 1   TURMERIC PO Take 1 tablet by mouth 3 (three) times a week.     umeclidinium-vilanterol (ANORO ELLIPTA) 62.5-25 MCG/ACT AEPB Inhale 1 puff into the lungs daily. (Patient not taking: Reported on 08/17/2022) 180 each 3   No current facility-administered medications for this visit.    SURGICAL HISTORY:  Past Surgical History:  Procedure Laterality Date   BRONCHIAL BIOPSY  05/26/2022   Procedure: BRONCHIAL BIOPSIES;  Surgeon: Josephine Igo, DO;  Location: [facility];  Service: Pulmonary;;   BRONCHIAL BRUSHINGS  05/26/2022   Procedure: BRONCHIAL BRUSHINGS;  Surgeon: Josephine Igo, DO;  Location: [facility];  Service: Pulmonary;;   NO PAST SURGERIES     VIDEO BRONCHOSCOPY  05/26/2022   Procedure: VIDEO BRONCHOSCOPY WITHOUT FLUORO;  Surgeon: Josephine Igo, DO;  Location: [facility];  Service: Pulmonary;;    REVIEW OF SYSTEMS:  A comprehensive review of systems was negative except for: Constitutional: positive for fatigue   PHYSICAL EXAMINATION: General appearance: alert,  cooperative, fatigued, and no distress Head: Normocephalic, without obvious abnormality, atraumatic Neck: no adenopathy, no JVD, supple, symmetrical, trachea midline, and thyroid not enlarged, symmetric, no tenderness/mass/nodules Lymph nodes: Cervical, supraclavicular, and axillary nodes normal. Resp: clear to auscultation bilaterally Back: symmetric, no curvature. ROM normal. No CVA tenderness. Cardio: regular rate and rhythm, S1, S2 normal, no murmur, click, rub or gallop GI: soft, non-tender; bowel sounds normal; no masses,  no organomegaly Extremities: extremities normal, atraumatic, no cyanosis or edema  ECOG PERFORMANCE STATUS: 1 - Symptomatic but completely ambulatory  Blood pressure 98/64, pulse 94, temperature 98 F (36.7 C), temperature source Oral, resp. rate 16, height 5\' 9"  (1.753 m), weight 143 lb (64.9 kg), SpO2 100%.  LABORATORY DATA: Lab Results  Component Value Date   WBC 7.4 09/01/2022   HGB 9.1 (L) 09/01/2022   HCT 27.0 (L) 09/01/2022  MCV 92.2 09/01/2022   PLT 266 09/01/2022      Chemistry      Component Value Date/Time   NA 140 09/01/2022 1402   NA 145 (H) 10/21/2021 1038   K 3.9 09/01/2022 1402   CL 105 09/01/2022 1402   CO2 23 09/01/2022 1402   BUN 23 09/01/2022 1402   BUN 19 10/21/2021 1038   CREATININE 1.19 09/01/2022 1402   CREATININE 1.07 08/27/2014 1511      Component Value Date/Time   CALCIUM 9.5 09/01/2022 1402   ALKPHOS 82 09/01/2022 1402   AST 16 09/01/2022 1402   ALT 17 09/01/2022 1402   BILITOT 0.2 (L) 09/01/2022 1402       RADIOGRAPHIC STUDIES: CT Angio Chest PE W and/or Wo Contrast  Result Date: 08/25/2022 CLINICAL DATA:  Pulmonary embolus suspected with high probability. Recent diagnosis of pulmonary embolus in June 2024. Acute worsening of dyspnea. Shortness of breath and dizziness since this morning. History of lung cancer. EXAM: CT ANGIOGRAPHY CHEST WITH CONTRAST TECHNIQUE: Multidetector CT imaging of the chest was  performed using the standard protocol during bolus administration of intravenous contrast. Multiplanar CT image reconstructions and MIPs were obtained to evaluate the vascular anatomy. RADIATION DOSE REDUCTION: This exam was performed according to the departmental dose-optimization program which includes automated exposure control, adjustment of the mA and/or kV according to patient size and/or use of iterative reconstruction technique. CONTRAST:  80mL OMNIPAQUE IOHEXOL 350 MG/ML SOLN COMPARISON:  08/06/2022 FINDINGS: Cardiovascular: There is good opacification of the central and segmental pulmonary arteries. No focal filling defects are identified. No evidence of significant pulmonary embolus. The previous right lower lobe pulmonary embolus has resolved in the interval. Normal heart size. No pericardial effusions. Normal caliber thoracic aorta. No aortic dissection. Calcification of the aorta. Mediastinum/Nodes: Esophagus is decompressed. No significant lymphadenopathy. Thyroid gland is unremarkable. Lungs/Pleura: Patchy consolidation focally in the right lower lung with secretions or debris in the adjacent right lower lobe bronchus. This is likely pneumonia or aspiration. Left lung is clear. No pleural effusions. No pneumothorax. Upper Abdomen: No acute abnormality. Musculoskeletal: Lucent destructive bone lesion in the T8 vertebra, likely metastatic. No change since prior study. Review of the MIP images confirms the above findings. IMPRESSION: 1. No evidence of significant pulmonary embolus. Previous right lower lobe pulmonary embolus has resolved in the interval. 2. New focal area of consolidation in the right lower lung with secretions or debris in the adjacent right lower lobe bronchus. Changes likely to represent pneumonia or aspiration. 3. Lucent lesion in the T8 vertebra, likely metastasis.  No change. Electronically Signed   By: Burman Nieves M.D.   On: 08/25/2022 17:55   DG Chest Port 1  View  Result Date: 08/25/2022 CLINICAL DATA:  Shortness of breath EXAM: PORTABLE CHEST 1 VIEW COMPARISON:  10/25/2019, PET CT 06/04/2022, chest CT 08/06/2022 FINDINGS: No acute airspace disease or effusion. Known left perihilar upper lobe mass is better seen on previous CT imaging. Normal cardiac size. No pneumothorax IMPRESSION: No active disease. Known left perihilar upper lobe mass is better seen on previous CT imaging. Electronically Signed   By: Jasmine Pang M.D.   On: 08/25/2022 17:13    ASSESSMENT AND PLAN: This is a very pleasant 67 years old African-American male with Stage IV (T2a, N2, M1b) non-small cell lung cancer, squamous cell carcinoma presented with left upper lobe lung mass in addition to AP window lymphadenopathy and suspicious bone metastasis to the T8 and L4 vertebrae as well as  right retroperitoneal metastatic nodule diagnosed in April 2024.  Molecular studies by [id number] showed no actionable mutations and PD-L1 expression of 70%. The patient is currently undergoing palliative combination of systemic chemotherapy with carboplatin for AUC of 5, paclitaxel 175 Mg/M2 and immunotherapy with Libtayo (Cempilimab) 350 Mg IV every 3 weeks with Neulasta support for 4 cycles followed by maintenance treatment with Libtayo (Cempilimab) of the patient has no disease progression after cycle #4.  He is status post 4 cycles. The patient tolerated the induction treatment with chemotherapy and Libtayo (Cempilimab) fairly well.  He is here today for evaluation before starting the first cycle of maintenance treatment with single agent Libtayo (Cempilimab). I recommended for the patient to proceed with the treatment as planned. For the back pain he underwent palliative radiotherapy to the bone disease at T8 and L4 vertebral lesions. For the bone metastasis, he is currently on treatment with Xgeva 120 mg subcutaneously every 6 weeks. The patient will come back for follow-up visit in 3 weeks for  evaluation before the next cycle of his treatment. I advised the patient to call immediately if he has any other concerning symptoms in the interval. The patient voices understanding of current disease status and treatment options and is in agreement with the current care plan.  All questions were answered. The patient knows to call the clinic with any problems, questions or concerns. We can certainly see the patient much sooner if necessary.  The total time spent in the appointment was 20 minutes.  Disclaimer: This note was dictated with voice recognition software. Similar sounding words can inadvertently be transcribed and may not be corrected upon review.

## 2022-09-08 NOTE — Patient Instructions (Signed)
[facility]  Discharge Instructions: Thank you for choosing [facility] to provide your oncology and hematology care.   If you have a lab appointment with the [facility], please go directly to the [facility] and check in at the registration area.   Wear comfortable clothing and clothing appropriate for easy access to any Portacath or PICC line.   We strive to give you quality time with your provider. You may need to reschedule your appointment if you arrive late (15 or more minutes).  Arriving late affects you and other patients whose appointments are after yours.  Also, if you miss three or more appointments without notifying the office, you may be dismissed from the clinic at the provider's discretion.      For prescription refill requests, have your pharmacy contact our office and allow 72 hours for refills to be completed.    Today you received the following chemotherapy and/or immunotherapy agents; Cemiplimab (Libtayo)      To help prevent nausea and vomiting after your treatment, we encourage you to take your nausea medication as directed.  BELOW ARE SYMPTOMS THAT SHOULD BE REPORTED IMMEDIATELY: *FEVER GREATER THAN 100.4 F (38 C) OR HIGHER *CHILLS OR SWEATING *NAUSEA AND VOMITING THAT IS NOT CONTROLLED WITH YOUR NAUSEA MEDICATION *UNUSUAL SHORTNESS OF BREATH *UNUSUAL BRUISING OR BLEEDING *URINARY PROBLEMS (pain or burning when urinating, or frequent urination) *BOWEL PROBLEMS (unusual diarrhea, constipation, pain near the anus) TENDERNESS IN MOUTH AND THROAT WITH OR WITHOUT PRESENCE OF ULCERS (sore throat, sores in mouth, or a toothache) UNUSUAL RASH, SWELLING OR PAIN  UNUSUAL VAGINAL DISCHARGE OR ITCHING   Items with * indicate a potential emergency and should be followed up as soon as possible or go to the Emergency Department if any problems should occur.  Please show the CHEMOTHERAPY ALERT CARD or IMMUNOTHERAPY ALERT  CARD at check-in to the Emergency Department and triage nurse.  Should you have questions after your visit or need to cancel or reschedule your appointment, please contact [facility]  Dept: [phone]  and follow the prompts.  Office hours are 8:00 a.m. to 4:30 p.m. Monday - Friday. Please note that voicemails left after 4:00 p.m. may not be returned until the following business day.  We are closed weekends and major holidays. You have access to a nurse at all times for urgent questions. Please call the main number to the clinic Dept: [phone] and follow the prompts.   For any non-urgent questions, you may also contact your provider using MyChart. We now offer e-Visits for anyone 59 and older to request care online for non-urgent symptoms. For details visit [email or web address].   Also download the MyChart app! Go to the app store, search "MyChart", open the app, select [facility], and log in with your MyChart username and password.

## 2022-09-10 ENCOUNTER — Telehealth: Payer: Self-pay | Admitting: Internal Medicine

## 2022-09-10 NOTE — Telephone Encounter (Signed)
Called patient regarding August/September appointments, left a voicemail.

## 2022-09-15 ENCOUNTER — Other Ambulatory Visit: Payer: Medicare Other | Admitting: Pharmacist

## 2022-09-15 NOTE — Progress Notes (Unsigned)
09/15/2022 Name: Albert Hardy. MRN: [id number] DOB: 04-15-1955  Chief Complaint  Patient presents with   Medication Management    Medication cost    [name]. is a 67 y.o. year old male who presented for a telephone visit.   They were referred to the pharmacist by their Case Management Team  for assistance in managing medication access.    Subjective:  Care Team: Primary Care Provider: [name], MD ; Next Scheduled Visit: not currently scheduled Oncology - Dr Si Gaul - next visit 09/29/2022 Pulmonology - Kandice Robinsons, NP - no current follow up scheduled; last visit was 06/03/2022 Palliative Care  Medication Access/Adherence  Current Pharmacy:  [facility] [address] [place] - [address] [place] [address] Phone: [phone] Fax: [phone]   Patient reports affordability concerns with their medications: Yes  - Reported cost of Eliquis and Anoro were > $400. Patient was screened for LIS and medication assistance program by [facility] pharmacy technician, Rollen Sox but household income was above cut off.  I explored possible assistance with COPD or Non Small cell lung cancer funds with the following:    [employer] [facility] Co-Payment Assistance Foundation [employer] Co-Pay Relief [employer] But all of these funds were currently close for new enrollees.   Cancer center did not have available assistance funds.   Patient reports access/transportation concerns to their pharmacy: No  Patient reports adherence concerns with their medications:  No      Patient does report that his wife called his [employer] plan and was able to get monthly cost of Eliquis and Anoro down to $140 / month which is states is affordable.   Objective:  Lab Results  Component Value Date   HGBA1C 5.7 (H) 04/29/2020    Lab Results  Component Value Date    CREATININE 1.10 09/08/2022   BUN 20 09/08/2022   NA 140 09/08/2022   K 3.7 09/08/2022   CL 103 09/08/2022   CO2 26 09/08/2022    Lab Results  Component Value Date   CHOL 169 10/21/2021   HDL 72 10/21/2021   LDLCALC 87 10/21/2021   TRIG 51 10/21/2021   CHOLHDL 2.3 10/21/2021    Medications Reviewed Today     Reviewed by Henrene Pastor, RPH-CPP (Pharmacist) on 09/15/22 at 1455  Med List Status: <None>   Medication Order Taking? Sig Documenting Provider Last Dose Status Informant  albuterol (VENTOLIN HFA) 108 (90 Base) MCG/ACT inhaler [id number] Yes Inhale 2 puffs into the lungs every 6 (six) hours as needed for wheezing or shortness of breath. [name], MD Taking Active   apixaban (ELIQUIS) 5 MG TABS tablet [id number] Yes Take 1 tablet (5 mg total) by mouth 2 (two) times daily. Heilingoetter, Cassandra L, PA-C Taking Active            Med Note Clydie Braun,  B   Tue Sep 15, 2022  2:42 PM)    Ascorbic Acid (VITAMIN C PO) [id number]  Take 1 tablet by mouth daily. [provider]  Active Self  Cholecalciferol (VITAMIN D3) 1000 UNITS CAPS [id number]  Take 1,000 Units by mouth daily. [provider]  Active Self  Coenzyme Q10 (CO Q 10 PO) [id number]  Take 1 tablet by mouth daily. [provider]  Active Self  Cyanocobalamin (VITAMIN B-12 PO) [id number]  Take 1 tablet by mouth daily. [provider]  Active Self  cyclobenzaprine (FLEXERIL) 5 MG tablet [id number]  Take 1 tablet (5 mg total) by mouth 3 (three) times daily as needed for muscle spasms. Pickenpack-Cousar, Arty Baumgartner, NP  Active            Med Note Michaela Corner   Tue Sep 01, 2022 10:23 AM) 09/01/22: Reports during TOC call, has not needed recently-- would like new Rx  ezetimibe (ZETIA) 10 MG tablet [id number] Yes Take 1 tablet (10 mg total) by mouth daily. [name], MD Taking Active   fluticasone [facility]) 50 MCG/ACT nasal spray [id number]  Place 1-2 sprays into both nostrils daily.  [name], MD  Active Self           Med Note [facility] B   Tue Sep 15, 2022  2:43 PM)    KRILL OIL PO [id number] Yes Take 1 tablet by mouth daily. [provider] Taking Active Self  lisinopril-hydrochlorothiazide (ZESTORETIC) 20-12.5 MG tablet [id number] Yes Take 2 tablets by mouth daily. [name], MD Taking Active   MAGNESIUM PO [id number]  Take 1 tablet by mouth daily. [provider]  Active Self  metoCLOPramide (REGLAN) 10 MG tablet [id number]  Take 1 tablet (10 mg total) by mouth 4 (four) times daily -  before meals and at bedtime. Pickenpack-Cousar, Arty Baumgartner, NP  Active            Med Note Michaela Corner   Tue Sep 01, 2022 10:24 AM) 09/01/22: Reports during TOC call, has not needed recently   morphine (MS CONTIN) 15 MG 12 hr tablet [id number] Yes Take 1 tablet (15 mg total) by mouth every 12 (twelve) hours. Pickenpack-Cousar, Arty Baumgartner, NP Taking Active   Multiple Vitamins-Minerals (CENTRUM SILVER PO) [id number]  Take 1 tablet by mouth daily. [provider]  Active Self  Multiple Vitamins-Minerals (ZINC PO) [id number]  Take 1 tablet by mouth daily. [provider]  Active Self  Naphazoline-Pheniramine (OPCON-A) 0.027-0.315 % SOLN [id number]  Place 1 drop into both eyes daily as needed (redness). [provider]  Active Self  omeprazole (PRILOSEC) 20 MG capsule [id number] Yes Take 1 capsule (20 mg total) by mouth daily. [name], MD Taking Active   oxyCODONE (OXY IR/ROXICODONE) 5 MG immediate release tablet [id number] Yes Take 1 tablet (5 mg total) by mouth every 4 (four) hours as needed for severe pain. Pickenpack-Cousar, Arty Baumgartner, NP Taking Active   prochlorperazine (COMPAZINE) 10 MG tablet [id number]  Take 1 tablet (10 mg total) by mouth every 6 (six) hours as needed for nausea or vomiting. Si Gaul, MD  Active   rosuvastatin (CRESTOR) 5 MG tablet [id number] Yes Take 1 tablet (5 mg total) by mouth at bedtime. [name], MD Taking Active   TURMERIC PO [id number] Yes Take 1 tablet by mouth 3 (three) times a week. [provider] Taking Active Self  umeclidinium-vilanterol (ANORO ELLIPTA) 62.5-25 MCG/ACT AEPB [id number] No Inhale 1 puff into the lungs daily.  Patient not taking: Reported on 08/17/2022   Josephine Igo, DO Not Taking Active Self           Med Note Haze Justin   Tue Sep 15, 2022  2:44 PM)                Assessment/Plan:   Medication Management: - Reviewed his [employer] plan [phone]) for possible lower cost alternatives to Anoro. No alternatives were found that were lower than tier 3 which is same as  Anoro.  - I think his insurance has either waived his $280 deductible or given him a tier exception which lowered copay to $140. (Patient is OK with that cost).  - Called his pharmacy to see if they would check cost of Anoro but they state last Rx was sent in 01/2021. Need updated Rx. Will check with Dr Neva Seat about sending in (or could request from pulmonology or palliative care providers).    Henrene Pastor, PharmD Clinical Pharmacist [facility]  Population Health  09/16/2022 - Addendum Dr Neva Seat sent in Rx for Anoro today. Called [facility] and initial cost quotes was still $336 but this was for 90 DS. They reprocessed for 30 DS and cost was $112.06. Patient notified and he is agreeable to that cost.

## 2022-09-16 ENCOUNTER — Other Ambulatory Visit: Payer: Self-pay | Admitting: Family Medicine

## 2022-09-16 ENCOUNTER — Other Ambulatory Visit: Payer: Self-pay | Admitting: Nurse Practitioner

## 2022-09-16 DIAGNOSIS — G893 Neoplasm related pain (acute) (chronic): Secondary | ICD-10-CM

## 2022-09-16 DIAGNOSIS — Z515 Encounter for palliative care: Secondary | ICD-10-CM

## 2022-09-16 DIAGNOSIS — C3492 Malignant neoplasm of unspecified part of left bronchus or lung: Secondary | ICD-10-CM

## 2022-09-16 MED ORDER — OXYCODONE HCL 5 MG PO TABS
5.0000 mg | ORAL_TABLET | ORAL | 0 refills | Status: DC | PRN
Start: 2022-09-16 — End: 2022-10-20

## 2022-09-16 MED ORDER — MORPHINE SULFATE ER 15 MG PO TBCR
15.0000 mg | EXTENDED_RELEASE_TABLET | Freq: Two times a day (BID) | ORAL | 0 refills | Status: DC
Start: 2022-09-16 — End: 2022-10-20

## 2022-09-16 MED ORDER — ANORO ELLIPTA 62.5-25 MCG/ACT IN AEPB: 1.0000 | INHALATION_SPRAY | Freq: Every day | RESPIRATORY_TRACT | 3 refills | Status: DC

## 2022-09-16 NOTE — Progress Notes (Signed)
Anoro refill needed - ordered.

## 2022-09-21 ENCOUNTER — Ambulatory Visit: Payer: Self-pay

## 2022-09-21 NOTE — Patient Outreach (Signed)
  Care Coordination   Initial Visit Note   09/21/2022 Name: Albert Hardy. MRN: [id number] DOB: 03/21/1955  Albert Hardy. is a 67 y.o. year old male who sees Albert Flood, MD for primary care. I spoke with  Albert Hardy. by phone today.  What matters to the patients health and wellness today?  Maintaining health    Goals Addressed             This Visit's Progress    Care Coordionation Activities-No follow up required       Care Coordination Interventions: Assessed patient understanding of cancer diagnosis and recommended treatment plan Reviewed upcoming provider appointments and treatment appointments Assessed available transportation to appointments and treatments. Has consistent/reliable transportation: Yes Assessed support system. Has consistent/reliable family or other support: Yes PHQ2/PHQ9 performed Active listening / Reflection utilized   Patient states he is doing well.  Medication costs resolved.  Cancer treatments palliative.  Patient reports he is thankful for all the support given during this time.  He declines further CM follow up at this time.  RN CM contact information given.  No concerns.            SDOH assessments and interventions completed:  Yes  SDOH Interventions Today    Flowsheet Row Most Recent Value  SDOH Interventions   Food Insecurity Interventions Intervention Not Indicated  Housing Interventions Intervention Not Indicated        Care Coordination Interventions:  Yes, provided   Follow up plan: No further intervention required.   Encounter Outcome:  Pt. Visit Completed   Albert Leriche, RN, MSN [facility] Care Management Care Management Coordinator Direct Line [phone]

## 2022-09-21 NOTE — Patient Instructions (Signed)
Visit Information  Thank you for taking time to visit with me today. Please don't hesitate to contact me if I can be of assistance to you.   Following are the goals we discussed today:   Goals Addressed             This Visit's Progress    Care Coordionation Activities-No follow up required       Care Coordination Interventions: Assessed patient understanding of cancer diagnosis and recommended treatment plan Reviewed upcoming provider appointments and treatment appointments Assessed available transportation to appointments and treatments. Has consistent/reliable transportation: Yes Assessed support system. Has consistent/reliable family or other support: Yes PHQ2/PHQ9 performed Active listening / Reflection utilized   Patient states he is doing well.  Medication costs resolved.  Cancer treatments palliative.  Patient reports he is thankful for all the support given during this time.  He declines further CM follow up at this time.  RN CM contact information given.  No concerns.               If you are experiencing a Mental Health or Behavioral Health Crisis or need someone to talk to, please call the Suicide and Crisis Lifeline: 988   Patient verbalizes understanding of instructions and care plan provided today and agrees to view in MyChart. Active MyChart status and patient understanding of how to access instructions and care plan via MyChart confirmed with patient.     The patient has been provided with contact information for the care management team and has been advised to call with any health related questions or concerns.   Bary Leriche, RN, MSN [facility] Care Management Care Management Coordinator Direct Line [phone]

## 2022-09-28 NOTE — Progress Notes (Unsigned)
Palliative Medicine [facility]  Telephone:([phone]) [phone] Fax:(336) [phone]   Name: Albert Hardy. Date: 09/28/2022 MRN: [id number]  DOB: 02/07/1956  Patient Care Team: [name], MD as PCP - General (Family Medicine)    INTERVAL HISTORY: Albert Gaby. is a 67 y.o. 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 Palliative ask to see for symptom management and goals of care   SOCIAL HISTORY:     reports that he quit smoking about 10 years ago. His smoking use included cigarettes. He started smoking about 40 years ago. He has a 30 pack-year smoking history. He has never used smokeless tobacco. He reports current alcohol use. He reports current drug use. Frequency: 1.00 time per week. Drug: Marijuana.  ADVANCE DIRECTIVES:  None on file  CODE STATUS: Full code  PAST MEDICAL HISTORY: Past Medical History:  Diagnosis Date   Aortic regurgitation    Aortic stenosis 04/04/2018   Atherosclerosis of native arteries of the extremities with intermittent claudication 09/08/2013   Bilateral impacted cerumen 10/13/2018   Bilateral sensorineural hearing loss 12/14/2018   Dyspnea    Former moderate cigarette smoker (10-19 per day) 05/20/2012   Quit smoking Nov 2013 when hospitalized w/ HTN and chest pain(diagnosed w/ valvular heart disease)   GERD (gastroesophageal reflux disease)    Heart murmur    History of radiation therapy    Lumbar Spine, Thoracic Spine- 06/25/22-07/10/22- Dr. 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  MEDICATIONS:  Current Outpatient Medications  Medication Sig Dispense Refill   albuterol (VENTOLIN HFA) 108 (90 Base)  MCG/ACT inhaler Inhale 2 puffs into the lungs every 6 (six) hours as needed for wheezing or shortness of breath. 8 g 6   apixaban (ELIQUIS) 5 MG TABS tablet Take 1 tablet (5 mg total) by mouth 2 (two) times daily. 60 tablet 2   Ascorbic Acid (VITAMIN C PO) Take 1 tablet by mouth daily.     Cholecalciferol (VITAMIN D3) 1000 UNITS CAPS Take 1,000 Units by mouth daily.     Coenzyme Q10 (CO Q 10 PO) Take 1 tablet by mouth daily.     Cyanocobalamin (VITAMIN B-12 PO) Take 1 tablet by mouth daily.     cyclobenzaprine (FLEXERIL) 5 MG tablet Take 1 tablet (5 mg total) by mouth 3 (three) times daily as needed for muscle spasms. 30 tablet 0   ezetimibe (ZETIA) 10 MG tablet Take 1 tablet (10 mg total) by mouth daily. 90 tablet 3   fluticasone (FLONASE) 50 MCG/ACT nasal spray Place 1-2 sprays into both nostrils daily. 16 g 6   KRILL OIL PO Take 1 tablet by mouth daily.     lisinopril-hydrochlorothiazide (ZESTORETIC) 20-12.5 MG tablet Take 2 tablets by mouth daily. 180 tablet 1   MAGNESIUM PO Take 1 tablet by mouth daily.     metoCLOPramide (REGLAN) 10 MG tablet Take 1 tablet (10 mg total) by mouth 4 (four) times daily -  before meals and at bedtime. 56 tablet 0   morphine (MS CONTIN) 15 MG 12 hr tablet Take 1 tablet (15 mg total) by mouth every 12 (twelve) hours. 60 tablet 0   Multiple Vitamins-Minerals (  CENTRUM SILVER PO) Take 1 tablet by mouth daily.     Multiple Vitamins-Minerals (ZINC PO) Take 1 tablet by mouth daily.     Naphazoline-Pheniramine (OPCON-A) 0.027-0.315 % SOLN Place 1 drop into both eyes daily as needed (redness).     omeprazole (PRILOSEC) 20 MG capsule Take 1 capsule (20 mg total) by mouth daily. 90 capsule 1   oxyCODONE (OXY IR/ROXICODONE) 5 MG immediate release tablet Take 1 tablet (5 mg total) by mouth every 4 (four) hours as needed for severe pain. 90 tablet 0   prochlorperazine (COMPAZINE) 10 MG tablet Take 1 tablet (10 mg total) by mouth every 6 (six) hours as needed for nausea or  vomiting. 30 tablet 0   rosuvastatin (CRESTOR) 5 MG tablet Take 1 tablet (5 mg total) by mouth at bedtime. 90 tablet 1   TURMERIC PO Take 1 tablet by mouth 3 (three) times a week.     umeclidinium-vilanterol (ANORO ELLIPTA) 62.5-25 MCG/ACT AEPB Inhale 1 puff into the lungs daily. 180 each 3   No current facility-administered medications for this visit.    VITAL SIGNS: There were no vitals taken for this visit. There were no vitals filed for this visit.  Estimated body mass index is 21.12 kg/m as calculated from the following:   Height as of 09/08/22: 5\' 9"  (1.753 m).   Weight as of 09/08/22: 143 lb (64.9 kg).     Latest Ref Rng & Units 09/08/2022    7:43 AM 09/01/2022    2:02 PM 08/25/2022    3:51 PM  CBC  WBC 4.0 - 10.5 K/uL 3.6  7.4  8.7   Hemoglobin 13.0 - 17.0 g/dL 9.2  9.1  9.6   Hematocrit 39.0 - 52.0 % 28.5  27.0  29.3   Platelets 150 - 400 K/uL 244  266  160        Latest Ref Rng & Units 09/08/2022    7:43 AM 09/01/2022    2:02 PM 08/25/2022    3:51 PM  CMP  Glucose 70 - 99 mg/dL 161  99  096   BUN 8 - 23 mg/dL 20  23  31    Creatinine 0.61 - 1.24 mg/dL 0.45  4.09  8.11   Sodium 135 - 145 mmol/L 140  140  132   Potassium 3.5 - 5.1 mmol/L 3.7  3.9  4.3   Chloride 98 - 111 mmol/L 103  105  100   CO2 22 - 32 mmol/L 26  23  22    Calcium 8.9 - 10.3 mg/dL 9.4  9.5  8.7   Total Protein 6.5 - 8.1 g/dL 7.3  7.3  7.6   Total Bilirubin 0.3 - 1.2 mg/dL 0.3  0.2  0.6   Alkaline Phos 38 - 126 U/L 58  82  89   AST 15 - 41 U/L 17  16  22    ALT 0 - 44 U/L 19  17  28      PERFORMANCE STATUS (ECOG) : 1 - Symptomatic but completely ambulatory   Physical Exam General: NAD Cardiovascular: RRR Pulmonary: normal breathing pattern  Extremities: no edema, no joint deformities Skin: no rashes Neurological: AAO x4  IMPRESSION: I saw Albert Hardy during his infusion. No acute distress. No family present. Denies nausea, vomiting, or diarrhea. Is trying to remain as active as possible. Feels  well overall. Is concerned about no increase in his weight despite good appetite. We discussed taking things one day at a time.   Neoplasm related  pain Albert Hardy reports his pain is well-controlled on current regimen.  Some days are better than others.     We discussed his regimen at length. He is currently taking MS Contin 15 mg every 12 hours, Oxy IR 5mg  every 4-6  hours as needed for breakthrough pain.  Flexeril as needed for spasms.    No changes to current regimen at this time.  Will continue to closely monitor.   Constipation Much improved with daily regimen to include Senna-S 2 tabs twice daily.   3. Decreased appetite/Weight loss  Appetite is much improved however patient remains concerned given no increase in his weight. States he is eating good, snacking throughout the day, with good protein intake. Current weight is 140lbs down from 143lbs on 7/11, 146lbs on 7/9. We discussed continuing with his favorite foods. Eating and snacking as he desires. Increasing caloric and protein intake.   4. Goals of Care   06/18/22-  We discussed his current illness and what it means in the larger context of hsi on-going co-morbidities. Natural disease trajectory and expectations were discussed.  Albert Hardy and his wife are realistic in their expectations and understanding of his incurable cancer. He knows all treatment is palliative focused. Wishes to continue taking things one day at a time focusing on his quality of life allowing him every opportunity to continue to thrive.   We discussed Her current illness and what it means in the larger context of Her on-going co-morbidities. Natural disease trajectory and expectations were discussed.  I discussed the importance of continued conversation with family and their medical providers regarding overall plan of care and treatment options, ensuring decisions are within the context of the patients values and GOCs.  PLAN:  Pepcid 20 mg to twice daily Oxy  IR 5mg  every 4-6 hours as needed for breakthrough pain  MS Contin 15 mg every 12 hours.  Tolerating well. Flexeril as needed Senna 2 tabs twice daily  Miralax daily  Education provided on importance of bowel regimen and nutritional intake.  Pain contract on file I will plan to see patient back in 3-4 weeks in collaboration to other oncology appointments.     Patient expressed understanding and was in agreement with this plan. He also understands that He can call the clinic at any time with any questions, concerns, or complaints.   Any controlled substances utilized were prescribed in the context of palliative care. PDMP has been reviewed.    Visit consisted of counseling and education dealing with the complex and emotionally intense issues of symptom management and palliative care in the setting of serious and potentially life-threatening illness.Greater than 50%  of this time was spent counseling and coordinating care related to the above assessment and plan.  Albert Hardy, AGPCNP-BC  Palliative Medicine Team/[facility]  *Please note that this is a verbal dictation therefore any spelling or grammatical errors are due to the "Dragon Medical One" system interpretation.

## 2022-09-29 ENCOUNTER — Inpatient Hospital Stay: Payer: Medicare Other | Attending: Internal Medicine

## 2022-09-29 ENCOUNTER — Inpatient Hospital Stay (HOSPITAL_BASED_OUTPATIENT_CLINIC_OR_DEPARTMENT_OTHER): Payer: Medicare Other | Admitting: Nurse Practitioner

## 2022-09-29 ENCOUNTER — Telehealth: Payer: Self-pay | Admitting: Family Medicine

## 2022-09-29 ENCOUNTER — Inpatient Hospital Stay (HOSPITAL_BASED_OUTPATIENT_CLINIC_OR_DEPARTMENT_OTHER): Payer: Medicare Other | Admitting: Internal Medicine

## 2022-09-29 ENCOUNTER — Inpatient Hospital Stay: Payer: Medicare Other

## 2022-09-29 ENCOUNTER — Encounter: Payer: Self-pay | Admitting: Nurse Practitioner

## 2022-09-29 ENCOUNTER — Other Ambulatory Visit: Payer: Self-pay

## 2022-09-29 DIAGNOSIS — C3492 Malignant neoplasm of unspecified part of left bronchus or lung: Secondary | ICD-10-CM

## 2022-09-29 DIAGNOSIS — Z79899 Other long term (current) drug therapy: Secondary | ICD-10-CM | POA: Insufficient documentation

## 2022-09-29 DIAGNOSIS — Z5112 Encounter for antineoplastic immunotherapy: Secondary | ICD-10-CM | POA: Diagnosis present

## 2022-09-29 DIAGNOSIS — Z87891 Personal history of nicotine dependence: Secondary | ICD-10-CM | POA: Insufficient documentation

## 2022-09-29 DIAGNOSIS — R634 Abnormal weight loss: Secondary | ICD-10-CM

## 2022-09-29 DIAGNOSIS — C3412 Malignant neoplasm of upper lobe, left bronchus or lung: Secondary | ICD-10-CM | POA: Diagnosis present

## 2022-09-29 DIAGNOSIS — K5903 Drug induced constipation: Secondary | ICD-10-CM

## 2022-09-29 DIAGNOSIS — G893 Neoplasm related pain (acute) (chronic): Secondary | ICD-10-CM | POA: Diagnosis not present

## 2022-09-29 DIAGNOSIS — Z515 Encounter for palliative care: Secondary | ICD-10-CM | POA: Diagnosis not present

## 2022-09-29 DIAGNOSIS — C7951 Secondary malignant neoplasm of bone: Secondary | ICD-10-CM | POA: Insufficient documentation

## 2022-09-29 LAB — CBC WITH DIFFERENTIAL (CANCER CENTER ONLY)
Abs Immature Granulocytes: 0.01 10*3/uL (ref 0.00–0.07)
Basophils Absolute: 0 10*3/uL (ref 0.0–0.1)
Basophils Relative: 0 %
Eosinophils Absolute: 0 10*3/uL (ref 0.0–0.5)
Eosinophils Relative: 1 %
HCT: 31.1 % — ABNORMAL LOW (ref 39.0–52.0)
Hemoglobin: 10.3 g/dL — ABNORMAL LOW (ref 13.0–17.0)
Immature Granulocytes: 0 %
Lymphocytes Relative: 16 %
Lymphs Abs: 0.6 10*3/uL — ABNORMAL LOW (ref 0.7–4.0)
MCH: 32.6 pg (ref 26.0–34.0)
MCHC: 33.1 g/dL (ref 30.0–36.0)
MCV: 98.4 fL (ref 80.0–100.0)
Monocytes Absolute: 0.4 10*3/uL (ref 0.1–1.0)
Monocytes Relative: 10 %
Neutro Abs: 2.7 10*3/uL (ref 1.7–7.7)
Neutrophils Relative %: 73 %
Platelet Count: 176 10*3/uL (ref 150–400)
RBC: 3.16 MIL/uL — ABNORMAL LOW (ref 4.22–5.81)
RDW: 15.9 % — ABNORMAL HIGH (ref 11.5–15.5)
WBC Count: 3.7 10*3/uL — ABNORMAL LOW (ref 4.0–10.5)
nRBC: 0 % (ref 0.0–0.2)

## 2022-09-29 LAB — CMP (CANCER CENTER ONLY)
ALT: 11 U/L (ref 0–44)
AST: 12 U/L — ABNORMAL LOW (ref 15–41)
Albumin: 4.3 g/dL (ref 3.5–5.0)
Alkaline Phosphatase: 53 U/L (ref 38–126)
Anion gap: 11 (ref 5–15)
BUN: 22 mg/dL (ref 8–23)
CO2: 24 mmol/L (ref 22–32)
Calcium: 9 mg/dL (ref 8.9–10.3)
Chloride: 102 mmol/L (ref 98–111)
Creatinine: 1.34 mg/dL — ABNORMAL HIGH (ref 0.61–1.24)
GFR, Estimated: 58 mL/min — ABNORMAL LOW (ref >60)
Glucose, Bld: 138 mg/dL — ABNORMAL HIGH (ref 70–99)
Potassium: 3.8 mmol/L (ref 3.5–5.1)
Sodium: 137 mmol/L (ref 135–145)
Total Bilirubin: 0.4 mg/dL (ref 0.3–1.2)
Total Protein: 7.7 g/dL (ref 6.5–8.1)

## 2022-09-29 MED ORDER — SODIUM CHLORIDE 0.9 % IV SOLN
350.0000 mg | Freq: Once | INTRAVENOUS | Status: AC
  Administered 2022-09-29: 350 mg via INTRAVENOUS
  Filled 2022-09-29: qty 7

## 2022-09-29 MED ORDER — SODIUM CHLORIDE 0.9 % IV SOLN: Freq: Once | INTRAVENOUS | Status: AC

## 2022-09-29 MED ORDER — DENOSUMAB 120 MG/1.7ML ~~LOC~~ SOLN
120.0000 mg | Freq: Once | SUBCUTANEOUS | Status: AC
  Administered 2022-09-29: 120 mg via SUBCUTANEOUS
  Filled 2022-09-29: qty 1.7

## 2022-09-29 NOTE — Telephone Encounter (Signed)
Caller name: Koleman Mckinnies  On DPR?: Yes  Call back number: [phone]  Provider they see: [name], MD  Reason for call:   Post Oncology visit  -BP low dizziness/pass out - low past 3 visits. Change BP meds -advise

## 2022-09-29 NOTE — Telephone Encounter (Signed)
Should pt re visit for BP at this time considering symptoms?

## 2022-09-29 NOTE — Patient Instructions (Signed)
[facility]  Discharge Instructions: Thank you for choosing [facility] to provide your oncology and hematology care.   If you have a lab appointment with the Cancer Center, please go directly to the Cancer Center and check in at the registration area.   Wear comfortable clothing and clothing appropriate for easy access to any Portacath or PICC line.   We strive to give you quality time with your provider. You may need to reschedule your appointment if you arrive late (15 or more minutes).  Arriving late affects you and other patients whose appointments are after yours.  Also, if you miss three or more appointments without notifying the office, you may be dismissed from the clinic at the provider's discretion.      For prescription refill requests, have your pharmacy contact our office and allow 72 hours for refills to be completed.    Today you received the following chemotherapy and/or immunotherapy agents: [id number]      To help prevent nausea and vomiting after your treatment, we encourage you to take your nausea medication as directed.  BELOW ARE SYMPTOMS THAT SHOULD BE REPORTED IMMEDIATELY: *FEVER GREATER THAN 100.4 F (38 C) OR HIGHER *CHILLS OR SWEATING *NAUSEA AND VOMITING THAT IS NOT CONTROLLED WITH YOUR NAUSEA MEDICATION *UNUSUAL SHORTNESS OF BREATH *UNUSUAL BRUISING OR BLEEDING *URINARY PROBLEMS (pain or burning when urinating, or frequent urination) *BOWEL PROBLEMS (unusual diarrhea, constipation, pain near the anus) TENDERNESS IN MOUTH AND THROAT WITH OR WITHOUT PRESENCE OF ULCERS (sore throat, sores in mouth, or a toothache) UNUSUAL RASH, SWELLING OR PAIN  UNUSUAL VAGINAL DISCHARGE OR ITCHING   Items with * indicate a potential emergency and should be followed up as soon as possible or go to the Emergency Department if any problems should occur.  Please show the CHEMOTHERAPY ALERT CARD or IMMUNOTHERAPY ALERT CARD at  check-in to the Emergency Department and triage nurse.  Should you have questions after your visit or need to cancel or reschedule your appointment, please contact [facility]  Dept: [phone]  and follow the prompts.  Office hours are 8:00 a.m. to 4:30 p.m. Monday - Friday. Please note that voicemails left after 4:00 p.m. may not be returned until the following business day.  We are closed weekends and major holidays. You have access to a nurse at all times for urgent questions. Please call the main number to the clinic Dept: [phone] and follow the prompts.   For any non-urgent questions, you may also contact your provider using MyChart. We now offer e-Visits for anyone 15 and older to request care online for non-urgent symptoms. For details visit [email or web address].   Also download the MyChart app! Go to the app store, search "MyChart", open the app, select [facility], and log in with your MyChart username and password.

## 2022-09-29 NOTE — Progress Notes (Signed)
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   Molecular studies by [id number] showed no actionable mutations and PD-L1 expression of 70%.  PRIOR THERAPY:  1) Palliative radiotherapy to the T8 and L4 metastatic bone disease. 2) ystemic chemotherapy with carboplatin for AUC of 5, paclitaxel 175 Mg/M2 and Libtayo (Cempilimab) 350 Mg IV every 3 weeks with Neulasta support for 4 cycles    CURRENT THERAPY: Maintenance treatment with single agent Libtayo (Cempilimab) 350 Mg IV every 3 weeks.  First dose September 08, 2022.  Status post 1 cycle.  INTERVAL HISTORY: Albert Hardy. 67 y.o. male returns to the clinic today for follow-up visit.  The patient is feeling fine today with no concerning complaints.  He tolerated the first cycle of his maintenance therapy with Libtayo (Cempilimab) fairly well.  The patient denied having any current chest pain, shortness of breath, cough or hemoptysis.  He has no nausea, vomiting, diarrhea or constipation.  He has no headache or visual changes.  He has no fever or chills.  He is here today for evaluation before starting cycle #2 of his treatment.  MEDICAL HISTORY: Past Medical History:  Diagnosis Date   Aortic regurgitation    Aortic stenosis 04/04/2018   Atherosclerosis of native arteries of the extremities with intermittent claudication 09/08/2013   Bilateral impacted cerumen 10/13/2018   Bilateral sensorineural hearing loss 12/14/2018   Dyspnea    Former moderate cigarette smoker (10-19 per day) 05/20/2012   Quit smoking Nov  2013 when hospitalized w/ HTN and chest pain(diagnosed w/ valvular heart disease)   GERD (gastroesophageal reflux disease)    Heart murmur    History of radiation therapy    Lumbar Spine, Thoracic Spine- 06/25/22-07/10/22- Dr. 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  MEDICATIONS:  Current Outpatient Medications  Medication Sig Dispense Refill   albuterol (VENTOLIN HFA) 108 (90 Base) MCG/ACT inhaler Inhale 2 puffs into the lungs every 6 (six) hours as needed for wheezing or shortness of breath. 8 g 6   apixaban (ELIQUIS) 5 MG TABS tablet Take 1 tablet (5 mg total) by mouth 2 (two) times daily. 60 tablet 2   Ascorbic Acid (VITAMIN C PO) Take 1 tablet by mouth daily.     Cholecalciferol (VITAMIN D3) 1000 UNITS CAPS Take 1,000 Units by mouth daily.     Coenzyme Q10 (CO Q 10 PO) Take 1 tablet by mouth daily.     Cyanocobalamin (VITAMIN B-12 PO) Take 1 tablet by mouth daily.     cyclobenzaprine (FLEXERIL) 5 MG tablet Take 1 tablet (5 mg total) by mouth 3 (three) times daily as needed for muscle spasms. 30 tablet 0   ezetimibe (ZETIA) 10 MG tablet Take 1 tablet (10 mg total) by mouth daily. 90 tablet 3   fluticasone (FLONASE)  50 MCG/ACT nasal spray Place 1-2 sprays into both nostrils daily. 16 g 6   KRILL OIL PO Take 1 tablet by mouth daily.     lisinopril-hydrochlorothiazide (ZESTORETIC) 20-12.5 MG tablet Take 2 tablets by mouth daily. 180 tablet 1   MAGNESIUM PO Take 1 tablet by mouth daily.     metoCLOPramide (REGLAN) 10 MG tablet Take 1 tablet (10 mg total) by mouth 4 (four) times daily -  before meals and at bedtime. 56 tablet 0   morphine (MS CONTIN) 15 MG 12 hr tablet Take 1 tablet (15 mg total) by mouth every 12 (twelve) hours. 60 tablet 0    Multiple Vitamins-Minerals (CENTRUM SILVER PO) Take 1 tablet by mouth daily.     Multiple Vitamins-Minerals (ZINC PO) Take 1 tablet by mouth daily.     Naphazoline-Pheniramine (OPCON-A) 0.027-0.315 % SOLN Place 1 drop into both eyes daily as needed (redness).     omeprazole (PRILOSEC) 20 MG capsule Take 1 capsule (20 mg total) by mouth daily. 90 capsule 1   oxyCODONE (OXY IR/ROXICODONE) 5 MG immediate release tablet Take 1 tablet (5 mg total) by mouth every 4 (four) hours as needed for severe pain. 90 tablet 0   prochlorperazine (COMPAZINE) 10 MG tablet Take 1 tablet (10 mg total) by mouth every 6 (six) hours as needed for nausea or vomiting. 30 tablet 0   rosuvastatin (CRESTOR) 5 MG tablet Take 1 tablet (5 mg total) by mouth at bedtime. 90 tablet 1   TURMERIC PO Take 1 tablet by mouth 3 (three) times a week.     umeclidinium-vilanterol (ANORO ELLIPTA) 62.5-25 MCG/ACT AEPB Inhale 1 puff into the lungs daily. 180 each 3   No current facility-administered medications for this visit.    SURGICAL HISTORY:  Past Surgical History:  Procedure Laterality Date   BRONCHIAL BIOPSY  05/26/2022   Procedure: BRONCHIAL BIOPSIES;  Surgeon: Josephine Igo, DO;  Location: [facility];  Service: Pulmonary;;   BRONCHIAL BRUSHINGS  05/26/2022   Procedure: BRONCHIAL BRUSHINGS;  Surgeon: Josephine Igo, DO;  Location: [facility];  Service: Pulmonary;;   NO PAST SURGERIES     VIDEO BRONCHOSCOPY  05/26/2022   Procedure: VIDEO BRONCHOSCOPY WITHOUT FLUORO;  Surgeon: Josephine Igo, DO;  Location: [facility];  Service: Pulmonary;;    REVIEW OF SYSTEMS:  A comprehensive review of systems was negative.   PHYSICAL EXAMINATION: General appearance: alert, cooperative, and no distress Head: Normocephalic, without obvious abnormality, atraumatic Neck: no adenopathy, no JVD, supple, symmetrical, trachea midline, and thyroid not enlarged, symmetric, no tenderness/mass/nodules Lymph nodes: Cervical,  supraclavicular, and axillary nodes normal. Resp: clear to auscultation bilaterally Back: symmetric, no curvature. ROM normal. No CVA tenderness. Cardio: regular rate and rhythm, S1, S2 normal, no murmur, click, rub or gallop GI: soft, non-tender; bowel sounds normal; no masses,  no organomegaly Extremities: extremities normal, atraumatic, no cyanosis or edema  ECOG PERFORMANCE STATUS: 1 - Symptomatic but completely ambulatory  Blood pressure (!) 96/58, pulse 82, temperature 98.3 F (36.8 C), temperature source Oral, resp. rate 17, height 5\' 9"  (1.753 m), weight 140 lb (63.5 kg), SpO2 100%.  LABORATORY DATA: Lab Results  Component Value Date   WBC 3.7 (L) 09/29/2022   HGB 10.3 (L) 09/29/2022   HCT 31.1 (L) 09/29/2022   MCV 98.4 09/29/2022   PLT 176 09/29/2022      Chemistry      Component Value Date/Time   NA 140 09/08/2022 0743   NA 145 (H) 10/21/2021 1038  K 3.7 09/08/2022 0743   CL 103 09/08/2022 0743   CO2 26 09/08/2022 0743   BUN 20 09/08/2022 0743   BUN 19 10/21/2021 1038   CREATININE 1.10 09/08/2022 0743   CREATININE 1.07 08/27/2014 1511      Component Value Date/Time   CALCIUM 9.4 09/08/2022 0743   ALKPHOS 58 09/08/2022 0743   AST 17 09/08/2022 0743   ALT 19 09/08/2022 0743   BILITOT 0.3 09/08/2022 0743       RADIOGRAPHIC STUDIES: No results found.  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  Molecular studies by [id number] showed no actionable mutations and PD-L1 expression of 70%. The patient underwent palliative combination of systemic chemotherapy with carboplatin for AUC of 5, paclitaxel 175 Mg/M2 and immunotherapy with  Libtayo (Cempilimab) 350 Mg IV every 3 weeks with Neulasta support for 4 cycles followed by maintenance treatment with Libtayo (Cempilimab) of the patient has no disease progression after cycle #4.  He is status post 4 cycles. He is currently undergoing maintenance treatment with single agent Libtayo (Cempilimab) 350 Mg IV every 3 weeks status post 1 cycle. The patient tolerated the first cycle of his maintenance therapy fairly well with no concerning adverse effects. I recommended for him to proceed with cycle #2 today as planned. For the back pain he underwent palliative radiotherapy to the bone disease at T8 and L4 vertebral lesions. For the bone metastasis, he is currently on treatment with Xgeva 120 mg subcutaneously every 6 weeks. For the hypotension, he will discuss with his primary care physician adjustment of his blood pressure medication. The patient was advised to call immediately if he has any other concerning symptoms in the interval. The patient voices understanding of current disease status and treatment options and is in agreement with the current care plan.  All questions were answered. The patient knows to call the clinic with any problems, questions or concerns. We can certainly see the patient much sooner if necessary.  The total time spent in the appointment was 20 minutes.  Disclaimer: This note was dictated with voice recognition software. Similar sounding words can inadvertently be transcribed and may not be corrected upon review.

## 2022-09-30 NOTE — Telephone Encounter (Signed)
Noted, I see he has an appointment with me tomorrow.  Thanks

## 2022-09-30 NOTE — Telephone Encounter (Signed)
Pt talked with oncologist and was okayed yesterday and was told to make an appointment, pt did not pass out at any point just felt light headed

## 2022-09-30 NOTE — Telephone Encounter (Signed)
Yes, should definitely be seen.  Please get more information as well, did he check his blood pressures at home? Any injuries when he passed out? When did he pass out?  May be best that he is seen in ER if any significant lightheadedness/dizziness or feels like he is going to pass out today.

## 2022-10-01 ENCOUNTER — Ambulatory Visit (INDEPENDENT_AMBULATORY_CARE_PROVIDER_SITE_OTHER): Payer: Medicare Other | Admitting: Family Medicine

## 2022-10-01 VITALS — BP 94/60 | HR 113 | Temp 99.3°F | Ht 69.0 in | Wt 138.8 lb

## 2022-10-01 DIAGNOSIS — R000 Tachycardia, unspecified: Secondary | ICD-10-CM | POA: Diagnosis not present

## 2022-10-01 DIAGNOSIS — R7989 Other specified abnormal findings of blood chemistry: Secondary | ICD-10-CM | POA: Diagnosis not present

## 2022-10-01 DIAGNOSIS — I10 Essential (primary) hypertension: Secondary | ICD-10-CM | POA: Diagnosis not present

## 2022-10-01 DIAGNOSIS — R079 Chest pain, unspecified: Secondary | ICD-10-CM

## 2022-10-01 DIAGNOSIS — I959 Hypotension, unspecified: Secondary | ICD-10-CM

## 2022-10-01 LAB — BASIC METABOLIC PANEL
BUN: 24 mg/dL — ABNORMAL HIGH (ref 6–23)
CO2: 28 mEq/L (ref 19–32)
Calcium: 9.3 mg/dL (ref 8.4–10.5)
Chloride: 99 mEq/L (ref 96–112)
Creatinine, Ser: 1.17 mg/dL (ref 0.40–1.50)
GFR: 64.61 mL/min (ref >60.00)
Glucose, Bld: 119 mg/dL — ABNORMAL HIGH (ref 70–99)
Potassium: 3.9 mEq/L (ref 3.5–5.1)
Sodium: 136 mEq/L (ref 135–145)

## 2022-10-01 NOTE — Patient Instructions (Signed)
Decrease blood pressure pill to just 1/day for now.  Make sure to drink plenty of fluids.  I will reach out to your cardiologist but I do not see any acute changes in your EKG today.  If any new or worsening chest pains be seen in the emergency room.  If any feelings that you are going to pass out or worsening dizziness, call 911 or be seen in the emergency room.  Keep a record of your blood pressures outside of the office and bring them to the next office visit. Return to the clinic or go to the nearest emergency room if any of your symptoms worsen or new symptoms occur.

## 2022-10-01 NOTE — Progress Notes (Signed)
Subjective:  Patient ID: Albert Hardy., male    DOB: 23-Dec-1955  Age: 67 y.o. MRN: [id number]  CC:  Chief Complaint  Patient presents with   Blood Pressure Concern    Pt has been experiencing low BP, slight nausea in the last few days, headache with blurriness.     HPI Albert Hardy. presents for   Hypotension: See recent telephone call.  Concern for low blood pressures at home with lightheadedness/dizziness.  Last visit with me on July 24 with blood pressure 122/70 at that time.  He was continuing his total dose of lisinopril 40 mg and hydrochlorothiazide 25 mg daily with 2 of the combo 20/12.5 mg pills. Low readings since last visit noted at the cancer center.  Blood pressure was low at 98/64 at his follow-up July 30 with oncology.  He is on chemotherapy.  July 30 note, received 700 cc IV fluid, blood pressure 111/42 at that time. Blood pressure 96/58 at his August 20 visit with oncology.  Chemotherapy that day.Has had some dizziness, but no syncope 2 days ago. Drinking fluids. No diarrhea or vomiting. Urinating normally. No melena/hematochezia.  No change in BP meds.  Episodic chest pains - comes and goes. On Eliquis for PE (RLL).  Left sided chest pain past few weeks, at rest usually,  few times per day - some shortness of breath. No palpitations.  Took antihypertensives already today - this morning.   Cardiologist Dr. Dulce Sellar, last visit September 2023.  At that time cardiac CTA was normal and chest pain-free.  History of aortic valvular disease, mild stenosis and mild regurgitation, continue on aspirin.  Plan for follow-up in 1 to 3 years with repeat echo. Lab Results  Component Value Date   WBC 3.7 (L) 09/29/2022   HGB 10.3 (L) 09/29/2022   HCT 31.1 (L) 09/29/2022   MCV 98.4 09/29/2022   PLT 176 09/29/2022  Hemoglobin improving - 9.2 on 09/08/22.  Normal tsh on 7/23.  Lab Results  Component Value Date   TSH 0.871 09/01/2022   Lab Results  Component Value Date   NA  137 09/29/2022   K 3.8 09/29/2022   CL 102 09/29/2022   CO2 24 09/29/2022   Lab Results  Component Value Date   CREATININE 1.34 (H) 09/29/2022  Creat up from 1.1 on 7/30   BP Readings from Last 3 Encounters:  10/01/22 94/60  09/29/22 (!) 96/58  09/08/22 (!) 111/42     History Patient Active Problem List   Diagnosis Date Noted   Encounter for antineoplastic chemotherapy 08/18/2022   Encounter for antineoplastic immunotherapy 08/18/2022   Squamous cell carcinoma of lung, stage IV, left (HCC) 05/29/2022   Lung nodule 05/15/2022   Heart murmur 08/28/2021   Substance abuse (HCC) 08/28/2021   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   Bilateral sensorineural hearing loss 12/14/2018   Bilateral impacted cerumen 10/13/2018   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   Aortic stenosis 04/04/2018   Atherosclerosis of native arteries of the extremities with intermittent claudication 09/08/2013   Former moderate cigarette smoker (10-19 per day) 05/20/2012   Peripheral vascular disease with claudication    Hyperlipidemia    Aortic regurgitation    GERD (gastroesophageal reflux disease)    Hypertension    Past Medical History:  Diagnosis Date   Aortic regurgitation    Aortic stenosis 04/04/2018   Atherosclerosis of native arteries of the extremities with intermittent claudication 09/08/2013   Bilateral impacted cerumen 10/13/2018   Bilateral sensorineural hearing  loss 12/14/2018   Dyspnea    Former moderate cigarette smoker (10-19 per day) 05/20/2012   Quit smoking Nov 2013 when hospitalized w/ HTN and chest pain(diagnosed w/ valvular heart disease)   GERD (gastroesophageal reflux disease)    Heart murmur    History of radiation therapy    Lumbar Spine, Thoracic Spine- 06/25/22-07/10/22- Dr. 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   Peripheral vascular disease with claudication    ABI .49     Subjective tinnitus of both ears  10/13/2018   Substance abuse [facility])    Past Surgical History:  Procedure Laterality Date   BRONCHIAL BIOPSY  05/26/2022   Procedure: BRONCHIAL BIOPSIES;  Surgeon: Josephine Igo, DO;  Location: [facility];  Service: Pulmonary;;   BRONCHIAL BRUSHINGS  05/26/2022   Procedure: BRONCHIAL BRUSHINGS;  Surgeon: Josephine Igo, DO;  Location: [facility];  Service: Pulmonary;;   NO PAST SURGERIES     VIDEO BRONCHOSCOPY  05/26/2022   Procedure: VIDEO BRONCHOSCOPY WITHOUT FLUORO;  Surgeon: Josephine Igo, DO;  Location: [facility]OS[facility];  Service: Pulmonary;;   Allergies  Allergen Reactions   Lipitor [Atorvastatin]     myalgia   Pravastatin Other (See Comments)    myalgia   Prior to Admission medications   Medication Sig Start Date End Date Taking? Authorizing Provider  albuterol (VENTOLIN HFA) 108 (90 Base) MCG/ACT inhaler Inhale 2 puffs into the lungs every 6 (six) hours as needed for wheezing or shortness of breath. 09/02/22  Yes [name], MD  apixaban (ELIQUIS) 5 MG TABS tablet Take 1 tablet (5 mg total) by mouth 2 (two) times daily. 08/18/22  Yes Heilingoetter, Cassandra L, PA-C  Ascorbic Acid (VITAMIN C PO) Take 1 tablet by mouth daily.   Yes [provider]  Cholecalciferol (VITAMIN D3) 1000 UNITS CAPS Take 1,000 Units by mouth daily.   Yes [provider]  Coenzyme Q10 (CO Q 10 PO) Take 1 tablet by mouth daily.   Yes [provider]  Cyanocobalamin (VITAMIN B-12 PO) Take 1 tablet by mouth daily.   Yes [provider]  cyclobenzaprine (FLEXERIL) 5 MG tablet Take 1 tablet (5 mg total) by mouth 3 (three) times daily as needed for muscle spasms. 06/18/22  Yes Pickenpack-Cousar, Arty Baumgartner, NP  ezetimibe (ZETIA) 10 MG tablet Take 1 tablet (10 mg total) by mouth daily. 09/02/22  Yes [name], MD  fluticasone [facility]) 50 MCG/ACT nasal spray Place 1-2 sprays into both nostrils daily. 04/24/20  Yes [name], MD  KRILL OIL PO Take 1 tablet by  mouth daily.   Yes [provider]  lisinopril-hydrochlorothiazide (ZESTORETIC) 20-12.5 MG tablet Take 2 tablets by mouth daily. 09/02/22  Yes [name], MD  MAGNESIUM PO Take 1 tablet by mouth daily.   Yes [provider]  metoCLOPramide (REGLAN) 10 MG tablet Take 1 tablet (10 mg total) by mouth 4 (four) times daily -  before meals and at bedtime. 07/20/22  Yes Pickenpack-Cousar, Arty Baumgartner, NP  morphine (MS CONTIN) 15 MG 12 hr tablet Take 1 tablet (15 mg total) by mouth every 12 (twelve) hours. 09/16/22  Yes Pickenpack-Cousar, Arty Baumgartner, NP  Multiple Vitamins-Minerals (CENTRUM SILVER PO) Take 1 tablet by mouth daily.   Yes [provider]  Multiple Vitamins-Minerals (ZINC PO) Take 1 tablet by mouth daily.   Yes [provider]  Naphazoline-Pheniramine (OPCON-A) 0.027-0.315 % SOLN Place 1 drop into both  eyes daily as needed (redness).   Yes [provider]  omeprazole (PRILOSEC) 20 MG capsule Take 1 capsule (20 mg total) by mouth daily. 09/02/22  Yes [name], MD  oxyCODONE (OXY IR/ROXICODONE) 5 MG immediate release tablet Take 1 tablet (5 mg total) by mouth every 4 (four) hours as needed for severe pain. 09/16/22  Yes Pickenpack-Cousar, Arty Baumgartner, NP  rosuvastatin (CRESTOR) 5 MG tablet Take 1 tablet (5 mg total) by mouth at bedtime. 09/02/22  Yes [name], MD  TURMERIC PO Take 1 tablet by mouth 3 (three) times a week.   Yes [provider]  umeclidinium-vilanterol (ANORO ELLIPTA) 62.5-25 MCG/ACT AEPB Inhale 1 puff into the lungs daily. 09/16/22  Yes [name], MD  prochlorperazine (COMPAZINE) 10 MG tablet Take 1 tablet (10 mg total) by mouth every 6 (six) hours as needed for nausea or vomiting. Patient not taking: Reported on 10/01/2022 06/09/22   Si Gaul, MD   Social History   Socioeconomic History   Marital status: Married    Spouse name: [name]   Number of children: Not on file   Years of education: Not on  file   Highest education level: 12th grade  Occupational History   Occupation: Recruitment consultant  Tobacco Use   Smoking status: Former    Current packs/day: 0.00    Average packs/day: 1 pack/day for 30.0 years (30.0 ttl pk-yrs)    Types: Cigarettes    Start date: 12/20/1981    Quit date: 12/21/2011    Years since quitting: 10.7   Smokeless tobacco: Never  Vaping Use   Vaping status: Every Day  Substance and Sexual Activity   Alcohol use: Yes    Comment: Ocassionaly.    Drug use: Yes    Frequency: 1.0 times per week    Types: Marijuana   Sexual activity: Yes    Birth control/protection: None  Other Topics Concern   Not on file  Social History Narrative   Married. Education: [employer]. Exercise:  "Somewhat".   Social Determinants of Health   Financial Resource Strain: Medium Risk (09/01/2022)   Overall Financial Resource Strain (CARDIA)    Difficulty of Paying Living Expenses: Somewhat hard  Food Insecurity: No Food Insecurity (09/21/2022)   Hunger Vital Sign    Worried About Running Out of Food in the Last Year: Never true    Ran Out of Food in the Last Year: Never true  Transportation Needs: No Transportation Needs (09/01/2022)   PRAPARE - Administrator, Civil Service (Medical): No    Lack of Transportation (Non-Medical): No  Physical Activity: Unknown (09/01/2022)   Exercise Vital Sign    Days of Exercise per Week: 0 days    Minutes of Exercise per Session: Not on file  Recent Concern: Physical Activity - Inactive (09/01/2022)   Exercise Vital Sign    Days of Exercise per Week: 0 days    Minutes of Exercise per Session: 0 min  Stress: No Stress Concern Present (09/01/2022)   [employer] of Occupational Health - Occupational Stress Questionnaire    Feeling of Stress : Only a little  Social Connections: Socially Integrated (09/01/2022)   Social Connection and Isolation Panel [NHANES]    Frequency of Communication with Friends and Family: More than three  times a week    Frequency of Social Gatherings with Friends and Family: Once a week    Attends Religious Services: More than 4 times per year    Active Member  of Clubs or Organizations: Yes    Attends Banker Meetings: More than 4 times per year    Marital Status: Married  Catering manager Violence: Not At Risk (06/17/2022)   Humiliation, Afraid, Rape, and Kick questionnaire    Fear of Current or Ex-Partner: No    Emotionally Abused: No    Physically Abused: No    Sexually Abused: No    Review of Systems  Per HPI Objective:   Vitals:   10/01/22 1115  BP: 94/60  Pulse: (!) 113  Temp: 99.3 F (37.4 C)  SpO2: 100%  Weight: 138 lb 12.8 oz (63 kg)  Height: 5\' 9"  (1.753 m)     Physical Exam Vitals reviewed.  Constitutional:      General: He is not in acute distress.    Appearance: He is well-developed. He is not ill-appearing or diaphoretic.  HENT:     Head: Normocephalic and atraumatic.  Neck:     Vascular: No carotid bruit or JVD.  Cardiovascular:     Rate and Rhythm: Regular rhythm. Tachycardia present.     Heart sounds: Normal heart sounds. No murmur heard. Pulmonary:     Effort: Pulmonary effort is normal.     Breath sounds: Normal breath sounds. No rales.  Skin:    General: Skin is warm and dry.  Neurological:     Mental Status: He is alert and oriented to person, place, and time.  Psychiatric:        Mood and Affect: Mood normal.   EKG: Sinus tachycardia with rate 103.  Q wave in lead II, III, aVF.No apparent significant changes compared to 08/25/2022 EKG.      Assessment & Plan:  Dekon Tann. is a 67 y.o. male . Essential hypertension - Plan: EKG 12-Lead  Hypotension, unspecified hypotension type  Elevated serum creatinine - Plan: Basic metabolic panel  Tachycardia - Plan: EKG 12-Lead  Chest pain, unspecified type  Hypertension, overcontrolled hypertension.  Intermittent chest discomfort, no acute findings seen on EKG.  Contacted  his previous cardiologist and he will be seen on Monday of next week.  In the interim ER precautions were given/911 precautions.  Maintenance of hydration discussed, decrease his blood pressure to half of current dose with home monitoring recommended.  Recheck next week.  No orders of the defined types were placed in this encounter.  Patient Instructions  Decrease blood pressure pill to just 1/day for now.  Make sure to drink plenty of fluids.  I will reach out to your cardiologist but I do not see any acute changes in your EKG today.  If any new or worsening chest pains be seen in the emergency room.  If any feelings that you are going to pass out or worsening dizziness, call 911 or be seen in the emergency room.  Keep a record of your blood pressures outside of the office and bring them to the next office visit. Return to the clinic or go to the nearest emergency room if any of your symptoms worsen or new symptoms occur.       Signed,   Meredith Staggers, MD [facility], [facility] 10/01/22 11:39 AM

## 2022-10-02 ENCOUNTER — Encounter: Payer: Self-pay | Admitting: Family Medicine

## 2022-10-04 NOTE — Progress Notes (Unsigned)
Cardiology Office Note:    Date:  10/05/2022   ID:  Albert Hardy., DOB 09-15-1955, MRN [id number]  PCP:  [name], MD  Cardiologist:  Norman Herrlich, MD    Referring MD: [name], MD    ASSESSMENT:    1. Angina pectoris (HCC)   2. Nonrheumatic aortic valve stenosis   3. Primary hypertension    PLAN:    In order of problems listed above:  He occasionally gets anginal discomfort I am going to give him prescription for nitroglycerin he can take as needed I told him he need to stay supine or his feet up for 15 to 20 minutes afterwards Check echocardiogram I do not think he has had rapid progression of his aortic valve disease but at times with radiation and chemotherapy cardiomyopathy can occur I asked him to hold his antihypertensive in a day his systolic is less than 115 potentially he may not require antihypertensive therapy at this time   Next appointment: 32-month   Medication Adjustments/Labs and Tests Ordered: Current medicines are reviewed at length with the patient today.  Concerns regarding medicines are outlined above.  Orders Placed This Encounter  Procedures   EKG 12-Lead   No orders of the defined types were placed in this encounter.    History of Present Illness:    Albert Hardy. is a 67 y.o. male with a hx of arctic valve disease with aortic stenosis and aortic regurgitation frequent PVCs peripheral vascular disease with claudication chest pain and frequent PVCs.  He was last seen 10/21/2021.  His evaluation showed normal coronary arteriography and a coronary calcium score of 0 and both mild aortic stenosis mild aortic regurgitation and decrease in ectopy on monitoring.  Referred back by his PCP after office visit 10/01/2022 with hypotension noted at the cancer center where he is receiving chemotherapy blood pressure 98/64 July 30.  No complaints of chest pain and shortness of breath.  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  Has received palliative radiotherapy and systemic chemotherapy And is now receiving single agent Libtayo maintenance.  He is anticoagulated with apixaban.Last CT chest 08/25/2022 showed no finding of pulmonary embolism with resolution of the previous right lower lobe pulmonary embolism in the interval no focal area of consolidation of the right lower lung with secretions or debris and T8 metastasis.  Compliance with diet, lifestyle and medications: Yes  Overall he has done well with his cancer treatment however he finds himself increasingly weak variable shortness of breath at times even with ADLs and walking indoors and occasionally has anginal chest pain despite normal coronary arteriography He is not having edema orthopnea or syncope He is also mildly anemic His antihypertensive was decreased because he is having blood pressures less than 100 systolic Past Medical History:  Diagnosis Date   Aortic regurgitation    Aortic stenosis 04/04/2018   Atherosclerosis of native arteries of the extremities with intermittent claudication 09/08/2013   Bilateral impacted cerumen 10/13/2018   Bilateral sensorineural hearing loss 12/14/2018   Dyspnea    Former moderate cigarette smoker (10-19 per day) 05/20/2012   Quit smoking Nov 2013 when hospitalized w/ HTN and chest pain(diagnosed w/ valvular heart disease)   GERD (gastroesophageal reflux disease)    Heart murmur    History of radiation therapy    Lumbar Spine, Thoracic Spine- 06/25/22-07/10/22- Dr. Antony Blackbird   Hyperlipidemia    Hypertension  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   Peripheral vascular disease with claudication    ABI .49     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   Substance abuse (HCC)     Current Medications: Current Meds  Medication Sig   albuterol (VENTOLIN HFA) 108 (90 Base) MCG/ACT inhaler Inhale 2 puffs into the lungs every 6  (six) hours as needed for wheezing or shortness of breath.   apixaban (ELIQUIS) 5 MG TABS tablet Take 1 tablet (5 mg total) by mouth 2 (two) times daily.   Ascorbic Acid (VITAMIN C PO) Take 1 tablet by mouth daily.   Cholecalciferol (VITAMIN D3) 1000 UNITS CAPS Take 1,000 Units by mouth daily.   Coenzyme Q10 (CO Q 10 PO) Take 1 tablet by mouth daily.   Cyanocobalamin (VITAMIN B-12 PO) Take 1 tablet by mouth daily.   cyclobenzaprine (FLEXERIL) 5 MG tablet Take 1 tablet (5 mg total) by mouth 3 (three) times daily as needed for muscle spasms.   ezetimibe (ZETIA) 10 MG tablet Take 1 tablet (10 mg total) by mouth daily.   fluticasone (FLONASE) 50 MCG/ACT nasal spray Place 1-2 sprays into both nostrils daily.   KRILL OIL PO Take 1 tablet by mouth daily.   lisinopril-hydrochlorothiazide (ZESTORETIC) 20-12.5 MG tablet Take 2 tablets by mouth daily.   MAGNESIUM PO Take 1 tablet by mouth daily.   metoCLOPramide (REGLAN) 10 MG tablet Take 1 tablet (10 mg total) by mouth 4 (four) times daily -  before meals and at bedtime.   morphine (MS CONTIN) 15 MG 12 hr tablet Take 1 tablet (15 mg total) by mouth every 12 (twelve) hours.   Multiple Vitamins-Minerals (CENTRUM SILVER PO) Take 1 tablet by mouth daily.   Multiple Vitamins-Minerals (ZINC PO) Take 1 tablet by mouth daily.   Naphazoline-Pheniramine (OPCON-A) 0.027-0.315 % SOLN Place 1 drop into both eyes daily as needed (redness).   omeprazole (PRILOSEC) 20 MG capsule Take 1 capsule (20 mg total) by mouth daily.   oxyCODONE (OXY IR/ROXICODONE) 5 MG immediate release tablet Take 1 tablet (5 mg total) by mouth every 4 (four) hours as needed for severe pain.   prochlorperazine (COMPAZINE) 10 MG tablet Take 1 tablet (10 mg total) by mouth every 6 (six) hours as needed for nausea or vomiting.   rosuvastatin (CRESTOR) 5 MG tablet Take 1 tablet (5 mg total) by mouth at bedtime.   TURMERIC PO Take 1 tablet by mouth 3 (three) times a week.   umeclidinium-vilanterol  (ANORO ELLIPTA) 62.5-25 MCG/ACT AEPB Inhale 1 puff into the lungs daily.      EKGs/Labs/Other Studies Reviewed:    The following studies were reviewed today:  Cardiac Studies & Procedures     STRESS TESTS  NM MYOCAR MULTI W/SPECT W 12/22/2011  Narrative *RADIOLOGY REPORT*  Clinical Data:  Chest pain and history of hypertension and tobacco use.  MYOCARDIAL IMAGING WITH SPECT (REST AND PHARMACOLOGIC-STRESS) GATED LEFT VENTRICULAR WALL MOTION STUDY LEFT VENTRICULAR EJECTION FRACTION  Technique:  Standard myocardial SPECT imaging was performed after resting intravenous injection of 10 mCi Tc-49m sestamibi. Subsequently, intravenous infusion of Lexiscan was performed under the supervision of the Cardiology staff.  At peak effect of the drug, 30 mCi Tc-65m sestamibi was injected intravenously and standard myocardial SPECT  imaging was performed.  Quantitative gated imaging was also performed to evaluate left ventricular wall motion, and estimate left ventricular ejection fraction.  Comparison:  None.  Findings: Utilizing gated data, the end-diastolic volume is estimated to be 184 ml and  the end-systolic volume 91 ml. Calculated ejection fraction is 51%.  Gated wall motion analysis shows normal left ventricular wall motion.  SPECT imaging shows attenuation of the basilar aspect of the inferior wall.  Component of scar is suspected.  However, this may also be secondary to a component of diaphragmatic attenuation. There is no evidence of inducible ischemia with Lexiscan administration.  IMPRESSION:  1.  Normal left ventricular function with quantitative ejection fraction of 51% and normal wall motion identified. 2.  Possible component of inferior wall scar involving the basilar aspect of the inferior wall.  There may be additional component of diaphragmatic attenuation.  No inducible ischemia.   Original Report Authenticated By: Irish Lack, M.D.    ECHOCARDIOGRAM  ECHOCARDIOGRAM COMPLETE 09/24/2021  Narrative ECHOCARDIOGRAM REPORT    Patient Name:   Vernard Rymer. Date of Exam: 09/24/2021 Medical Rec #:  [id number]       Height:       69.0 in Accession #:    [id number]      Weight:       170.0 lb Date of Birth:  Sep 17, 1955       BSA:          1.928 m Patient Age:    66 years        BP:           148/92 mmHg Patient Gender: M               HR:           47 bpm. Exam Location:  [place]  Procedure: 2D Echo, Cardiac Doppler and Color Doppler  Indications:    Aortic valve disease  History:        Patient has prior history of Echocardiogram examinations, most recent 04/05/2018. Aortic Valve Disease, Arrythmias:Bradycardia, Signs/Symptoms:Murmur; Risk Factors:Dyslipidemia.  Sonographer:    Roosvelt Maser RDCS Referring Phys: [id number] Cecelia Graciano J Rozanne Heumann  IMPRESSIONS   1. Left ventricular ejection fraction, by estimation, is 60 to 65%. The left ventricle has normal function. The left ventricle has no regional wall motion abnormalities. Left ventricular diastolic parameters were normal. 2. Right ventricular systolic function is normal. The right ventricular size is normal. 3. Right atrial size was mildly dilated. 4. The mitral valve is normal in structure. No evidence of mitral valve regurgitation. No evidence of mitral stenosis. 5. The aortic valve was not well visualized. Aortic valve regurgitation is mild. Mild aortic valve stenosis. 6. The inferior vena cava is normal in size with greater than 50% respiratory variability, suggesting right atrial pressure of 3 mmHg.  FINDINGS Left Ventricle: Left ventricular ejection fraction, by estimation, is 60 to 65%. The left ventricle has normal function. The left ventricle has no regional wall motion abnormalities. The left ventricular internal cavity size was normal in size. There is no left ventricular hypertrophy. Left ventricular diastolic parameters were normal.  Right Ventricle: The  right ventricular size is normal. No increase in right ventricular wall thickness. Right ventricular systolic function is normal.  Left Atrium: Left atrial size was normal in size.  Right Atrium: Right atrial size was mildly dilated.  Pericardium: There is no evidence of pericardial effusion.  Mitral Valve: The mitral valve is normal in structure. No evidence of mitral valve regurgitation. No evidence of mitral valve stenosis.  Tricuspid Valve: The tricuspid valve is normal in structure. Tricuspid valve regurgitation is not demonstrated. No evidence of tricuspid stenosis.  Aortic Valve: The aortic valve was not well visualized. Aortic valve  regurgitation is mild. Mild aortic stenosis is present. Aortic valve mean gradient measures 10.5 mmHg. Aortic valve peak gradient measures 21.6 mmHg. Aortic valve area, by VTI measures 2.53 cm.  Pulmonic Valve: The pulmonic valve was normal in structure. Pulmonic valve regurgitation is not visualized. No evidence of pulmonic stenosis.  Aorta: The aortic root is normal in size and structure.  Venous: The inferior vena cava is normal in size with greater than 50% respiratory variability, suggesting right atrial pressure of 3 mmHg.  IAS/Shunts: No atrial level shunt detected by color flow Doppler.   LEFT VENTRICLE PLAX 2D LVIDd:         4.60 cm LVIDs:         3.20 cm LV PW:         1.00 cm LV IVS:        1.10 cm LVOT diam:     1.80 cm LV SV:         113 LV SV Index:   58 LVOT Area:     2.54 cm   LEFT ATRIUM             Index LA diam:        4.40 cm 2.28 cm/m LA Vol (A2C):   46.7 ml 24.22 ml/m LA Vol (A4C):   55.8 ml 28.94 ml/m LA Biplane Vol: 52.2 ml 27.07 ml/m AORTIC VALVE AV Area (Vmax):    2.16 cm AV Area (Vmean):   2.13 cm AV Area (VTI):     2.53 cm AV Vmax:           232.50 cm/s AV Vmean:          144.500 cm/s AV VTI:            0.446 m AV Peak Grad:      21.6 mmHg AV Mean Grad:      10.5 mmHg LVOT Vmax:         197.00  cm/s LVOT Vmean:        121.000 cm/s LVOT VTI:          0.443 m LVOT/AV VTI ratio: 0.99  AORTA Ao Asc diam: 2.80 cm   SHUNTS Systemic VTI:  0.44 m Systemic Diam: 1.80 cm  Belva Crome MD Electronically signed by Belva Crome MD Signature Date/Time: 09/24/2021/12:19:17 PM    Final    MONITORS  LONG TERM MONITOR (3-14 DAYS) 09/23/2021  Narrative Patch Wear Time:  7 days and 5 hours (2023-07-27T11:55:53-0400 to 2023-08-03T17:20:39-0400)  Patient had a min HR of 43 bpm, max HR of 160 bpm, and avg HR of 59 bpm. Predominant underlying rhythm was Sinus Rhythm.  There were no pauses of 3 seconds or greater and no episodes of second or third-degree AV nodal block.  There were 2 triggered events both of which were sinus rhythm without ectopy.  4 Supraventricular Tachycardia runs occurred, the run with the fastest interval lasting 5 beats with a max rate of 160 bpm, the longest lasting 8 beats with an avg rate of 100 bpm. Isolated SVEs were rare (<1.0%), SVE Couplets were rare (<1.0%), and SVE Triplets were rare (<1.0%).  There were no episodes of atrial fibrillation or flutter.  Isolated VEs were rare (<1.0%, 3105), VE Couplets were rare (<1.0%, 29), and VE Triplets were rare (<1.0%, 1). Ventricular Bigeminy and Trigeminy were present.   CT SCANS  CT CORONARY MORPH W/CTA COR W/SCORE 09/23/2021  Addendum 09/23/2021  5:24 PM ADDENDUM REPORT: 09/23/2021 17:22  CLINICAL DATA:  CP  EXAM: Cardiac/Coronary  CTA  TECHNIQUE: The patient was scanned on a [employer].  FINDINGS: A 100 kV prospective scan was triggered in the descending thoracic aorta at 111 HU's. Axial non-contrast 3 mm slices were carried out through the heart. The data set was analyzed on a dedicated work station and scored using the Agatson method. Gantry rotation speed was 250 msecs and collimation was .6 mm. No beta blockade and 0.8 mg of sl NTG was given. The 3D data set was reconstructed in  5% intervals of the 67-82 % of the R-R cycle. Diastolic phases were analyzed on a dedicated work station using MPR, MIP and VRT modes. The patient received 80 cc of contrast.  Aorta: Normal size. Mild calcifications in the ascending aorta noted. No dissection.  Aortic Valve:  Trileaflet. Mild calcifications.  Coronary Arteries:  Normal coronary origin.  Left dominance.  RCA is a small, non- dominant artery.  There is no plaque.  Left main is a large artery that gives rise to LAD and LCX arteries.  LAD is a large vessel that has no plaque. This artery gives rise to moderate size D1 and small D2.  LCX is a non-dominant artery that gives rise to one large OM1, large OM2 and moderate size OM3 branches. There is no plaque.  Other findings:  Normal pulmonary vein drainage into the left atrium. Common left pulmonary trunk noted - normal variant.  Normal left atrial appendage without a thrombus.  Normal size of the pulmonary artery.  IMPRESSION: 1. Coronary calcium score of 0. This was 0 percentile for age and sex matched control.  2. Normal coronary origin with right dominance.  3. CAD-RADS 0. No evidence of CAD (0%). Consider non-atherosclerotic causes of chest pain.  4. Mild calcifications of the aorta and aortic valve noted.   Electronically Signed By: Gypsy Balsam M.D. On: 09/23/2021 17:22  Narrative EXAM: OVER-READ INTERPRETATION  CT CHEST  The following report is a limited chest CT over-read performed by radiologist Dr. Gaylyn Rong of [facility] on 09/23/2021. This over-read does not include interpretation of cardiac or coronary anatomy or pathology. The coronary CTA interpretation by the cardiologist is attached.  COMPARISON:  Lung cancer screening CT chest 09/23/2020 (exactly 1 year prior to current exam)  FINDINGS: Extracardiac vascular: Unremarkable  Mediastinum/Nodes: Unremarkable  Lungs/Pleura: There is mucus in the  bronchus intermedius, right lower lobe bronchus, and particularly mucus plugging in the posterior basal segmental bronchus of the right lower lobe.  Upper Abdomen: 0.8 by 0.6 by 0.6 cm subcapsular focus of arterial phase enhancement in segment 4a of the liver on image 48 series 10.  Musculoskeletal: Unremarkable  IMPRESSION: 1. Mucous plugging in the posterior basal segmental bronchus of the right lower lobe with additional mucus in the bronchus intermedius and right lower lobe bronchus. No alveolar airspace opacity identified. 2. 8 mm in long axis subcapsular focus of arterial phase enhancement in segment 4a of the liver. Based on location and appearance is most likely from vascular shunting or a small flash filling hemangioma. Benign etiology is favored. If the patient has a history of gastrointestinal malignancy or abnormal liver enzymes, consider dedicated hepatic protocol MRI with and without contrast.  Electronically Signed: By: Gaylyn Rong M.D. On: 09/23/2021 14:33              Recent Labs: 08/25/2022: B Natriuretic Peptide 72.3 09/01/2022: TSH 0.871 09/29/2022: ALT 11; Hemoglobin 10.3; Platelet Count 176 10/01/2022: BUN 24; Creatinine, Ser 1.17; Potassium  3.9; Sodium 136  Recent Lipid Panel    Component Value Date/Time   CHOL 169 10/21/2021 1038   TRIG 51 10/21/2021 1038   HDL 72 10/21/2021 1038   CHOLHDL 2.3 10/21/2021 1038   CHOLHDL 3 06/12/2021 1423   VLDL 14.4 06/12/2021 1423   LDLCALC 87 10/21/2021 1038    Physical Exam:    VS:  BP 100/60 (BP Location: Right Arm, Patient Position: Sitting, Cuff Size: Normal)   Pulse 86   Ht 5\' 9"  (1.753 m)   Wt 136 lb 9.6 oz (62 kg)   SpO2 96%   BMI 20.17 kg/m     Wt Readings from Last 3 Encounters:  10/05/22 136 lb 9.6 oz (62 kg)  10/01/22 138 lb 12.8 oz (63 kg)  09/29/22 140 lb (63.5 kg)     GEN:  Well nourished, well developed in no acute distress HEENT: Normal NECK: No JVD; No carotid  bruits LYMPHATICS: No lymphadenopathy CARDIAC: RRR, no murmurs, rubs, gallops RESPIRATORY:  Clear to auscultation without rales, wheezing or rhonchi  ABDOMEN: Soft, non-tender, non-distended MUSCULOSKELETAL:  No edema; No deformity  SKIN: Warm and dry NEUROLOGIC:  Alert and oriented x 3 PSYCHIATRIC:  Normal affect    Signed, Norman Herrlich, MD  10/05/2022 10:41 AM    [facility]

## 2022-10-05 ENCOUNTER — Encounter: Payer: Self-pay | Admitting: Cardiology

## 2022-10-05 ENCOUNTER — Ambulatory Visit: Payer: Medicare Other | Attending: Cardiology | Admitting: Cardiology

## 2022-10-05 VITALS — BP 100/60 | HR 86 | Ht 69.0 in | Wt 136.6 lb

## 2022-10-05 DIAGNOSIS — I10 Essential (primary) hypertension: Secondary | ICD-10-CM | POA: Diagnosis not present

## 2022-10-05 DIAGNOSIS — I350 Nonrheumatic aortic (valve) stenosis: Secondary | ICD-10-CM | POA: Diagnosis present

## 2022-10-05 DIAGNOSIS — I209 Angina pectoris, unspecified: Secondary | ICD-10-CM

## 2022-10-05 MED ORDER — NITROGLYCERIN 0.4 MG SL SUBL: 0.4000 mg | SUBLINGUAL_TABLET | SUBLINGUAL | 1 refills | Status: AC | PRN

## 2022-10-05 NOTE — Patient Instructions (Signed)
Medication Instructions:  Your physician has recommended you make the following change in your medication:   START: Nitroglycerin 0.4 mg under the tongue every 5 minutes as needed for chest pain.  *If you need a refill on your cardiac medications before your next appointment, please call your pharmacy*   Lab Work: None If you have labs (blood work) drawn today and your tests are completely normal, you will receive your results only by: MyChart Message (if you have MyChart) OR A paper copy in the mail If you have any lab test that is abnormal or we need to change your treatment, we will call you to review the results.   Testing/Procedures: Your physician has requested that you have an echocardiogram. Echocardiography is a painless test that uses sound waves to create images of your heart. It provides your doctor with information about the size and shape of your heart and how well your heart's chambers and valves are working. This procedure takes approximately one hour. There are no restrictions for this procedure. Please do NOT wear cologne, perfume, aftershave, or lotions (deodorant is allowed). Please arrive 15 minutes prior to your appointment time.    Follow-Up: At [facility], you and your health needs are our priority.  As part of our continuing mission to provide you with exceptional heart care, we have created designated Provider Care Teams.  These Care Teams include your primary Cardiologist (physician) and Advanced Practice Providers (APPs -  Physician Assistants and Nurse Practitioners) who all work together to provide you with the care you need, when you need it.  We recommend signing up for the patient portal called "MyChart".  Sign up information is provided on this After Visit Summary.  MyChart is used to connect with patients for Virtual Visits (Telemedicine).  Patients are able to view lab/test results, encounter notes, upcoming appointments, etc.  Non-urgent  messages can be sent to your provider as well.   To learn more about what you can do with MyChart, go to [email or web address].    Your next appointment:   3 month(s)  Provider:   Norman Herrlich, MD    Other Instructions If systolic blood pressure is < 115 do not take lisinopril

## 2022-10-08 ENCOUNTER — Ambulatory Visit (INDEPENDENT_AMBULATORY_CARE_PROVIDER_SITE_OTHER): Payer: Medicare Other | Admitting: Family Medicine

## 2022-10-08 ENCOUNTER — Encounter: Payer: Self-pay | Admitting: Family Medicine

## 2022-10-08 VITALS — BP 112/60 | HR 102 | Temp 97.9°F | Ht 69.0 in | Wt 135.8 lb

## 2022-10-08 DIAGNOSIS — Z23 Encounter for immunization: Secondary | ICD-10-CM | POA: Diagnosis not present

## 2022-10-08 DIAGNOSIS — I959 Hypotension, unspecified: Secondary | ICD-10-CM

## 2022-10-08 DIAGNOSIS — I10 Essential (primary) hypertension: Secondary | ICD-10-CM | POA: Diagnosis not present

## 2022-10-08 MED ORDER — LISINOPRIL 10 MG PO TABS
10.0000 mg | ORAL_TABLET | Freq: Every day | ORAL | 1 refills | Status: DC
Start: 2022-10-08 — End: 2022-12-30

## 2022-10-08 NOTE — Patient Instructions (Signed)
Thank you for coming in today.  Blood pressure is at a level at this time but I do not think you need to starting the medicine.  If you notice that blood pressure is increasing, I did add a low-dose of lisinopril instead of the combination pill that you have taken previously.  If blood pressure still elevated without medication, then switch to your other combination lisinopril 20/12.5 mg dose.  Let me know if you make that change.  Follow-up with me in 3 months as long as symptoms are stable, let me know if there are questions in the meantime and take care.

## 2022-10-08 NOTE — Progress Notes (Signed)
Subjective:  Patient ID: Albert Council., male    DOB: [birth year]  Age: 67 y.o. MRN: [id number]  CC:  Chief Complaint  Patient presents with   Hypertension    Pt notes Monday 92/70, Tuesday 102/77, Today 114/62, cardiologist told him if BP is under 112 then he should not take the BP meds pt has therefore not taken it this week, notes no HYPO episodes either and no sxs      HPI Albert Council. presents for   Hypertension: With hypotension, chest discomfort discussed at August 22 visit.  Overcontrolled hypertension at that time.  No acute findings seen on EKG.  Decreased his lisinopril dose and hydrochlorothiazide dosing in half to a total of 20/12.5 mg daily. He was seen by cardiology 3 days ago.  Episodic anginal discomfort, given nitroglycerin if needed with orthostatic precautions.  Has not had to take that med.  Echo was ordered (in October) and advised to hold his antihypertensive if systolic is less than 115. As above, still running low, has held his medications past few days. No recent chest pain.  No further lightheadedness/dizziness. No syncope/near-syncope.  Feels ok.  Home readings: as above.  BP Readings from Last 3 Encounters:  10/08/22 112/60  10/05/22 100/60  10/01/22 94/60   Lab Results  Component Value Date   CREATININE 1.17 10/01/2022    History Patient Active Problem List   Diagnosis Date Noted   Encounter for antineoplastic chemotherapy 08/18/2022   Encounter for antineoplastic immunotherapy 08/18/2022   Squamous cell carcinoma of lung, stage IV, left (HCC) 05/29/2022   Lung nodule 05/15/2022   Heart murmur 08/28/2021   Substance abuse (HCC) 08/28/2021   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   Bilateral sensorineural hearing loss 12/14/2018   Bilateral impacted cerumen 10/13/2018   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   Aortic stenosis 04/04/2018   Atherosclerosis of native arteries of the extremities with intermittent claudication 09/08/2013    Former moderate cigarette smoker (10-19 per day) 05/20/2012   Peripheral vascular disease with claudication    Hyperlipidemia    Aortic regurgitation    GERD (gastroesophageal reflux disease)    Hypertension    Past Medical History:  Diagnosis Date   Aortic regurgitation    Aortic stenosis 04/04/2018   Atherosclerosis of native arteries of the extremities with intermittent claudication 09/08/2013   Bilateral impacted cerumen 10/13/2018   Bilateral sensorineural hearing loss 12/14/2018   Dyspnea    Former moderate cigarette smoker (10-19 per day) 05/20/2012   Quit smoking Nov 2013 when hospitalized w/ HTN and chest pain(diagnosed w/ valvular heart disease)   GERD (gastroesophageal reflux disease)    Heart murmur    History of radiation therapy    Lumbar Spine, Thoracic Spine- 06/25/22-07/10/22- Dr. 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   Substance abuse [facility])    Past Surgical History:  Procedure Laterality Date   BRONCHIAL BIOPSY  05/26/2022   Procedure: BRONCHIAL BIOPSIES;  Surgeon: Josephine Igo, DO;  Location: [facility];  Service: Pulmonary;;   BRONCHIAL BRUSHINGS  05/26/2022   Procedure: BRONCHIAL BRUSHINGS;  Surgeon: Josephine Igo, DO;  Location: [facility];  Service: Pulmonary;;   NO PAST SURGERIES     VIDEO BRONCHOSCOPY  05/26/2022   Procedure: VIDEO BRONCHOSCOPY WITHOUT FLUORO;  Surgeon: Josephine Igo, DO;  Location: [facility];  Service: Pulmonary;;   Allergies  Allergen Reactions   Lipitor [Atorvastatin]     myalgia   Pravastatin Other (See Comments)    myalgia   Prior to Admission medications   Medication Sig Start Date End Date Taking? Authorizing Provider  albuterol (VENTOLIN HFA) 108 (90 Base) MCG/ACT inhaler Inhale 2 puffs into the lungs every 6 (six) hours as needed for wheezing or  shortness of breath. 09/02/22  Yes [name], MD  apixaban (ELIQUIS) 5 MG TABS tablet Take 1 tablet (5 mg total) by mouth 2 (two) times daily. 08/18/22  Yes Heilingoetter, Cassandra L, PA-C  Ascorbic Acid (VITAMIN C PO) Take 1 tablet by mouth daily.   Yes [provider]  Cholecalciferol (VITAMIN D3) 1000 UNITS CAPS Take 1,000 Units by mouth daily.   Yes [provider]  Coenzyme Q10 (CO Q 10 PO) Take 1 tablet by mouth daily.   Yes [provider]  Cyanocobalamin (VITAMIN B-12 PO) Take 1 tablet by mouth daily.   Yes [provider]  cyclobenzaprine (FLEXERIL) 5 MG tablet Take 1 tablet (5 mg total) by mouth 3 (three) times daily as needed for muscle spasms. 06/18/22  Yes Pickenpack-Cousar, Arty Baumgartner, NP  ezetimibe (ZETIA) 10 MG tablet Take 1 tablet (10 mg total) by mouth daily. 09/02/22  Yes [name], MD  fluticasone [facility]) 50 MCG/ACT nasal spray Place 1-2 sprays into both nostrils daily. 04/24/20  Yes [name], MD  KRILL OIL PO Take 1 tablet by mouth daily.   Yes [provider]  lisinopril-hydrochlorothiazide (ZESTORETIC) 20-12.5 MG tablet Take 2 tablets by mouth daily. 09/02/22  Yes [name], MD  MAGNESIUM PO Take 1 tablet by mouth daily.   Yes [provider]  metoCLOPramide (REGLAN) 10 MG tablet Take 1 tablet (10 mg total) by mouth 4 (four) times daily -  before meals and at bedtime. 07/20/22  Yes Pickenpack-Cousar, Arty Baumgartner, NP  morphine (MS CONTIN) 15 MG 12 hr tablet Take 1 tablet (15 mg total) by mouth every 12 (twelve) hours. 09/16/22  Yes Pickenpack-Cousar, Arty Baumgartner, NP  Multiple Vitamins-Minerals (CENTRUM SILVER PO) Take 1 tablet by mouth daily.   Yes [provider]  Multiple Vitamins-Minerals (ZINC PO) Take 1 tablet by mouth daily.   Yes [provider]  Naphazoline-Pheniramine (OPCON-A) 0.027-0.315 % SOLN Place 1 drop into both eyes daily as needed (redness).   Yes [provider]   nitroGLYCERIN (NITROSTAT) 0.4 MG SL tablet Place 1 tablet (0.4 mg total) under the tongue every 5 (five) minutes as needed for chest pain. 10/05/22  Yes Baldo Daub, MD  omeprazole (PRILOSEC) 20 MG capsule Take 1 capsule (20 mg total) by mouth daily. 09/02/22  Yes [name], MD  oxyCODONE (OXY IR/ROXICODONE) 5 MG immediate release tablet Take 1 tablet (5 mg total) by mouth every 4 (four) hours as needed for severe pain. 09/16/22  Yes Pickenpack-Cousar, Arty Baumgartner, NP  prochlorperazine (COMPAZINE) 10 MG tablet Take 1 tablet (10 mg total) by mouth every 6 (six) hours as needed for nausea or vomiting. 06/09/22  Yes Si Gaul, MD  rosuvastatin (CRESTOR) 5 MG tablet Take 1 tablet (5 mg total) by mouth at bedtime. 09/02/22  Yes [name], MD  TURMERIC PO Take 1 tablet by mouth 3 (three) times a week.   Yes [provider]  umeclidinium-vilanterol (ANORO ELLIPTA) 62.5-25 MCG/ACT AEPB Inhale 1 puff into the lungs daily. 09/16/22  Yes Neva Seat,  Asencion Partridge, MD   Social History   Socioeconomic History   Marital status: Married    Spouse name: [name]   Number of children: Not on file   Years of education: Not on file   Highest education level: 12th grade  Occupational History   Occupation: Recruitment consultant  Tobacco Use   Smoking status: Former    Current packs/day: 0.00    Average packs/day: 1 pack/day for 30.0 years (30.0 ttl pk-yrs)    Types: Cigarettes    Start date: 12/20/1981    Quit date: 12/21/2011    Years since quitting: 10.8   Smokeless tobacco: Never  Vaping Use   Vaping status: Every Day  Substance and Sexual Activity   Alcohol use: Yes    Comment: Ocassionaly.    Drug use: Yes    Frequency: 1.0 times per week    Types: Marijuana   Sexual activity: Yes    Birth control/protection: None  Other Topics Concern   Not on file  Social History Narrative   Married. Education: [employer]. Exercise:  "Somewhat".   Social Determinants of Health   Financial  Resource Strain: Medium Risk (09/01/2022)   Overall Financial Resource Strain (CARDIA)    Difficulty of Paying Living Expenses: Somewhat hard  Food Insecurity: No Food Insecurity (09/21/2022)   Hunger Vital Sign    Worried About Running Out of Food in the Last Year: Never true    Ran Out of Food in the Last Year: Never true  Transportation Needs: No Transportation Needs (09/01/2022)   PRAPARE - Administrator, Civil Service (Medical): No    Lack of Transportation (Non-Medical): No  Physical Activity: Unknown (09/01/2022)   Exercise Vital Sign    Days of Exercise per Week: 0 days    Minutes of Exercise per Session: Not on file  Recent Concern: Physical Activity - Inactive (09/01/2022)   Exercise Vital Sign    Days of Exercise per Week: 0 days    Minutes of Exercise per Session: 0 min  Stress: No Stress Concern Present (09/01/2022)   [employer] of Occupational Health - Occupational Stress Questionnaire    Feeling of Stress : Only a little  Social Connections: Socially Integrated (09/01/2022)   Social Connection and Isolation Panel [NHANES]    Frequency of Communication with Friends and Family: More than three times a week    Frequency of Social Gatherings with Friends and Family: Once a week    Attends Religious Services: More than 4 times per year    Active Member of [employer] or Organizations: Yes    Attends Banker Meetings: More than 4 times per year    Marital Status: Married  Catering manager Violence: Not At Risk (06/17/2022)   Humiliation, Afraid, Rape, and Kick questionnaire    Fear of Current or Ex-Partner: No    Emotionally Abused: No    Physically Abused: No    Sexually Abused: No    Review of Systems Per HPI.   Objective:   Vitals:   10/08/22 1022  BP: 112/60  Pulse: (!) 102  Temp: 97.9 F (36.6 C)  TempSrc: Temporal  SpO2: 100%  Weight: 135 lb 12.8 oz (61.6 kg)  Height: 5\' 9"  (1.753 m)     Physical Exam Vitals reviewed.      Assessment & Plan:  Albert Simon. is a 67 y.o. male . Hypotension, unspecified hypotension type - Plan: lisinopril (ZESTRIL) 10 MG tablet Essential hypertension - Plan: lisinopril (ZESTRIL) 10  MG tablet Hypertension with recent hypotension, stable off meds at this time.  No recent chest pain.  Plan for echo as above.  Has nitroglycerin if needed.  Low-dose lisinopril printed if blood pressure increases as he may not need the dosing of the combination medication.  Option to restart his previous combo if blood pressures continue to increase.  RTC precautions, 62-month follow-up.  Need for influenza vaccination - Plan: Flu Vaccine Trivalent High Dose (Fluad) - defers to next visit.    Meds ordered this encounter  Medications   lisinopril (ZESTRIL) 10 MG tablet    Sig: Take 1 tablet (10 mg total) by mouth daily.    Dispense:  30 tablet    Refill:  1   There are no Patient Instructions on file for this visit.    Signed,   Meredith Staggers, MD [facility], [facility] 10/08/22 11:07 AM

## 2022-10-14 NOTE — Progress Notes (Signed)
Palliative Medicine [facility]  Telephone:([phone]) [phone] [phone]   Name: Albert Hardy. Date: 10/14/2022 MRN: [id number]  DOB: 1955/08/31  Patient Care Team: [name], MD as PCP - General (Family Medicine)    INTERVAL HISTORY: Albert Hardy. is a 67 y.o. 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 Palliative ask to see for symptom management and goals of care   SOCIAL HISTORY:     reports that he quit smoking about 10 years ago. Albert smoking use included cigarettes. He started smoking about 40 years ago. He has a 30 pack-year smoking history. He has never used smokeless tobacco. He reports current alcohol use. He reports current drug use. Frequency: 1.00 time per week. Drug: Marijuana.  ADVANCE DIRECTIVES:  None on file  CODE STATUS: Full code  PAST MEDICAL HISTORY: Past Medical History:  Diagnosis Date   Aortic regurgitation    Aortic stenosis 04/04/2018   Atherosclerosis of native arteries of the extremities with intermittent claudication 09/08/2013   Bilateral impacted cerumen 10/13/2018   Bilateral sensorineural hearing loss 12/14/2018   Dyspnea    Former moderate cigarette smoker (10-19 per day) 05/20/2012   Quit smoking Nov 2013 when hospitalized w/ HTN and chest pain(diagnosed w/ valvular heart disease)   GERD (gastroesophageal reflux disease)    Heart murmur    History of radiation therapy    Lumbar Spine, Thoracic Spine- 06/25/22-07/10/22- Dr. 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  MEDICATIONS:  Current Outpatient Medications  Medication Sig Dispense Refill   albuterol (VENTOLIN HFA) 108 (90 Base)  MCG/ACT inhaler Inhale 2 puffs into the lungs every 6 (six) hours as needed for wheezing or shortness of breath. 8 g 6   apixaban (ELIQUIS) 5 MG TABS tablet Take 1 tablet (5 mg total) by mouth 2 (two) times daily. 60 tablet 2   Ascorbic Acid (VITAMIN C PO) Take 1 tablet by mouth daily.     Cholecalciferol (VITAMIN D3) 1000 UNITS CAPS Take 1,000 Units by mouth daily.     Coenzyme Q10 (CO Q 10 PO) Take 1 tablet by mouth daily.     Cyanocobalamin (VITAMIN B-12 PO) Take 1 tablet by mouth daily.     cyclobenzaprine (FLEXERIL) 5 MG tablet Take 1 tablet (5 mg total) by mouth 3 (three) times daily as needed for muscle spasms. 30 tablet 0   ezetimibe (ZETIA) 10 MG tablet Take 1 tablet (10 mg total) by mouth daily. 90 tablet 3   fluticasone (FLONASE) 50 MCG/ACT nasal spray Place 1-2 sprays into both nostrils daily. 16 g 6   KRILL OIL PO Take 1 tablet by mouth daily.     lisinopril (ZESTRIL) 10 MG tablet Take 1 tablet (10 mg total) by mouth daily. 30 tablet 1   MAGNESIUM PO Take 1 tablet by mouth daily.     metoCLOPramide (REGLAN) 10 MG tablet Take 1 tablet (10 mg total) by mouth 4 (four) times daily -  before meals and at bedtime. 56 tablet 0   morphine (MS CONTIN) 15 MG 12 hr tablet Take 1 tablet (15 mg total) by mouth every 12 (twelve) hours. 60 tablet 0  Multiple Vitamins-Minerals (CENTRUM SILVER PO) Take 1 tablet by mouth daily.     Multiple Vitamins-Minerals (ZINC PO) Take 1 tablet by mouth daily.     Naphazoline-Pheniramine (OPCON-A) 0.027-0.315 % SOLN Place 1 drop into both eyes daily as needed (redness).     nitroGLYCERIN (NITROSTAT) 0.4 MG SL tablet Place 1 tablet (0.4 mg total) under the tongue every 5 (five) minutes as needed for chest pain. 25 tablet 1   omeprazole (PRILOSEC) 20 MG capsule Take 1 capsule (20 mg total) by mouth daily. 90 capsule 1   oxyCODONE (OXY IR/ROXICODONE) 5 MG immediate release tablet Take 1 tablet (5 mg total) by mouth every 4 (four) hours as needed for severe pain. 90  tablet 0   prochlorperazine (COMPAZINE) 10 MG tablet Take 1 tablet (10 mg total) by mouth every 6 (six) hours as needed for nausea or vomiting. 30 tablet 0   rosuvastatin (CRESTOR) 5 MG tablet Take 1 tablet (5 mg total) by mouth at bedtime. 90 tablet 1   TURMERIC PO Take 1 tablet by mouth 3 (three) times a week.     umeclidinium-vilanterol (ANORO ELLIPTA) 62.5-25 MCG/ACT AEPB Inhale 1 puff into the lungs daily. 180 each 3   No current facility-administered medications for this visit.    VITAL SIGNS: There were no vitals taken for this visit. There were no vitals filed for this visit.  Estimated body mass index is 20.05 kg/m as calculated from the following:   Height as of 10/08/22: 5\' 9"  (1.753 m).   Weight as of 10/08/22: 135 lb 12.8 oz (61.6 kg).     Latest Ref Rng & Units 09/29/2022    9:25 AM 09/08/2022    7:43 AM 09/01/2022    2:02 PM  CBC  WBC 4.0 - 10.5 K/uL 3.7  3.6  7.4   Hemoglobin 13.0 - 17.0 g/dL 82.9  9.2  9.1   Hematocrit 39.0 - 52.0 % 31.1  28.5  27.0   Platelets 150 - 400 K/uL 176  244  266        Latest Ref Rng & Units 10/01/2022   12:36 PM 09/29/2022    9:25 AM 09/08/2022    7:43 AM  CMP  Glucose 70 - 99 mg/dL 562  130  865   BUN 6 - 23 mg/dL 24  22  20    Creatinine 0.40 - 1.50 mg/dL 7.84  6.96  2.95   Sodium 135 - 145 mEq/L 136  137  140   Potassium 3.5 - 5.1 mEq/L 3.9  3.8  3.7   Chloride 96 - 112 mEq/L 99  102  103   CO2 19 - 32 mEq/L 28  24  26    Calcium 8.4 - 10.5 mg/dL 9.3  9.0  9.4   Total Protein 6.5 - 8.1 g/dL  7.7  7.3   Total Bilirubin 0.3 - 1.2 mg/dL  0.4  0.3   Alkaline Phos 38 - 126 U/L  53  58   AST 15 - 41 U/L  12  17   ALT 0 - 44 U/L  11  19     PERFORMANCE STATUS (ECOG) : 1 - Symptomatic but completely ambulatory   Physical Exam General: NAD Cardiovascular: RRR Pulmonary: normal breathing pattern  Extremities: no edema, no joint deformities Skin: no rashes Neurological: AAO x4  IMPRESSION: Albert Hardy presents to clinic for  follow-up. No acute distress. Remaining as active as possible. Denies ongoing nausea, vomiting, constipation, or diarrhea.   Neoplasm related  pain Albert Hardy reports Albert pain is well-controlled on current regimen.  Some days are better than others.  Occasional back spasms however are manageable.    We discussed Albert regimen at length. He is currently taking MS Contin 15 mg every 12 hours, Oxy IR 5mg  every 4-6  hours as needed for breakthrough pain.  Flexeril as needed for spasms.    No changes to current regimen at this time.  Will continue to closely monitor.   Constipation Much improved with daily regimen to include Senna-S 2 tabs twice daily.   3. Decreased appetite/Weight loss  Appetite fluctuates. Some days are better than others. Shares disappointment in taste changes specifically not being able to enjoy some of the foods that he enjoys due to taste alterations. We discussed use of lemon water, fresh ginger, and candy to neutralize taste buds with hopes to allow for ability to enjoy some foods. Samples of lemon and ginger candies/mints.   4. Goals of Care   06/18/22-  We discussed Albert current illness and what it means in the larger context of hsi on-going co-morbidities. Natural disease trajectory and expectations were discussed.  Albert Hardy and Albert Hardy are realistic in their expectations and understanding of Albert incurable cancer. He knows all treatment is palliative focused. Wishes to continue taking things one day at a time focusing on Albert quality of life allowing him every opportunity to continue to thrive.   We discussed Albert Hardy current illness and what it means in the larger context of Albert Hardy on-going co-morbidities. Natural disease trajectory and expectations were discussed.  I discussed the importance of continued conversation with family and their medical providers regarding overall plan of care and treatment options, ensuring decisions are within the context of the patients values and  GOCs.  PLAN:  Pepcid 20 mg to twice daily Oxy IR 5mg  every 4-6 hours as needed for breakthrough pain  MS Contin 15 mg every 12 hours.  Tolerating well. Flexeril as needed Senna 2 tabs twice daily  Miralax daily  Education provided on importance of bowel regimen and nutritional intake.  Pain contract on file I will plan to see patient back in 3-4 weeks in collaboration to other oncology appointments.     Patient expressed understanding and was in agreement with this plan. He also understands that He can call the clinic at any time with any questions, concerns, or complaints.   Any controlled substances utilized were prescribed in the context of palliative care. PDMP has been reviewed.    Visit consisted of counseling and education dealing with the complex and emotionally intense issues of symptom management and palliative care in the setting of serious and potentially life-threatening illness.Greater than 50%  of this time was spent counseling and coordinating care related to the above assessment and plan.  Willette Alma, AGPCNP-BC  Palliative Medicine Team/[facility]  *Please note that this is a verbal dictation therefore any spelling or grammatical errors are due to the "[employer]agon [employer] One" system interpretation.

## 2022-10-20 ENCOUNTER — Encounter: Payer: Self-pay | Admitting: Nurse Practitioner

## 2022-10-20 ENCOUNTER — Inpatient Hospital Stay (HOSPITAL_BASED_OUTPATIENT_CLINIC_OR_DEPARTMENT_OTHER): Payer: Medicare Other | Admitting: Internal Medicine

## 2022-10-20 ENCOUNTER — Inpatient Hospital Stay: Payer: Medicare Other | Attending: Internal Medicine

## 2022-10-20 ENCOUNTER — Inpatient Hospital Stay: Payer: Medicare Other

## 2022-10-20 ENCOUNTER — Encounter: Payer: Self-pay | Admitting: Medical Oncology

## 2022-10-20 ENCOUNTER — Inpatient Hospital Stay (HOSPITAL_BASED_OUTPATIENT_CLINIC_OR_DEPARTMENT_OTHER): Payer: Medicare Other | Admitting: Nurse Practitioner

## 2022-10-20 VITALS — BP 112/70 | HR 96 | Temp 100.0°F | Resp 16 | Ht 69.0 in | Wt 135.0 lb

## 2022-10-20 DIAGNOSIS — G893 Neoplasm related pain (acute) (chronic): Secondary | ICD-10-CM | POA: Diagnosis not present

## 2022-10-20 DIAGNOSIS — R630 Anorexia: Secondary | ICD-10-CM | POA: Diagnosis not present

## 2022-10-20 DIAGNOSIS — Z87891 Personal history of nicotine dependence: Secondary | ICD-10-CM | POA: Insufficient documentation

## 2022-10-20 DIAGNOSIS — Z79620 Long term (current) use of immunosuppressive biologic: Secondary | ICD-10-CM | POA: Diagnosis not present

## 2022-10-20 DIAGNOSIS — C3492 Malignant neoplasm of unspecified part of left bronchus or lung: Secondary | ICD-10-CM

## 2022-10-20 DIAGNOSIS — Z515 Encounter for palliative care: Secondary | ICD-10-CM | POA: Diagnosis not present

## 2022-10-20 DIAGNOSIS — C3490 Malignant neoplasm of unspecified part of unspecified bronchus or lung: Secondary | ICD-10-CM

## 2022-10-20 DIAGNOSIS — Z5112 Encounter for antineoplastic immunotherapy: Secondary | ICD-10-CM | POA: Insufficient documentation

## 2022-10-20 DIAGNOSIS — C3412 Malignant neoplasm of upper lobe, left bronchus or lung: Secondary | ICD-10-CM | POA: Insufficient documentation

## 2022-10-20 DIAGNOSIS — C7951 Secondary malignant neoplasm of bone: Secondary | ICD-10-CM | POA: Diagnosis present

## 2022-10-20 LAB — CBC WITH DIFFERENTIAL (CANCER CENTER ONLY)
Abs Immature Granulocytes: 0.01 10*3/uL (ref 0.00–0.07)
Basophils Absolute: 0 10*3/uL (ref 0.0–0.1)
Basophils Relative: 0 %
Eosinophils Absolute: 0 10*3/uL (ref 0.0–0.5)
Eosinophils Relative: 0 %
HCT: 32.7 % — ABNORMAL LOW (ref 39.0–52.0)
Hemoglobin: 10.6 g/dL — ABNORMAL LOW (ref 13.0–17.0)
Immature Granulocytes: 0 %
Lymphocytes Relative: 34 %
Lymphs Abs: 1.1 10*3/uL (ref 0.7–4.0)
MCH: 31.6 pg (ref 26.0–34.0)
MCHC: 32.4 g/dL (ref 30.0–36.0)
MCV: 97.6 fL (ref 80.0–100.0)
Monocytes Absolute: 0.3 10*3/uL (ref 0.1–1.0)
Monocytes Relative: 9 %
Neutro Abs: 1.9 10*3/uL (ref 1.7–7.7)
Neutrophils Relative %: 57 %
Platelet Count: 259 10*3/uL (ref 150–400)
RBC: 3.35 MIL/uL — ABNORMAL LOW (ref 4.22–5.81)
RDW: 13.6 % (ref 11.5–15.5)
WBC Count: 3.3 10*3/uL — ABNORMAL LOW (ref 4.0–10.5)
nRBC: 0 % (ref 0.0–0.2)

## 2022-10-20 LAB — CMP (CANCER CENTER ONLY)
ALT: 12 U/L (ref 0–44)
AST: 15 U/L (ref 15–41)
Albumin: 4.1 g/dL (ref 3.5–5.0)
Alkaline Phosphatase: 56 U/L (ref 38–126)
Anion gap: 10 (ref 5–15)
BUN: 14 mg/dL (ref 8–23)
CO2: 24 mmol/L (ref 22–32)
Calcium: 9.3 mg/dL (ref 8.9–10.3)
Chloride: 107 mmol/L (ref 98–111)
Creatinine: 1.06 mg/dL (ref 0.61–1.24)
GFR, Estimated: >60 mL/min (ref >60)
Glucose, Bld: 114 mg/dL — ABNORMAL HIGH (ref 70–99)
Potassium: 3.8 mmol/L (ref 3.5–5.1)
Sodium: 141 mmol/L (ref 135–145)
Total Bilirubin: 0.4 mg/dL (ref 0.3–1.2)
Total Protein: 8 g/dL (ref 6.5–8.1)

## 2022-10-20 LAB — TSH: TSH: 0.704 u[IU]/mL (ref 0.350–4.500)

## 2022-10-20 MED ORDER — SODIUM CHLORIDE 0.9 % IV SOLN: Freq: Once | INTRAVENOUS | Status: AC

## 2022-10-20 MED ORDER — SODIUM CHLORIDE 0.9 % IV SOLN
350.0000 mg | Freq: Once | INTRAVENOUS | Status: AC
  Administered 2022-10-20: 350 mg via INTRAVENOUS
  Filled 2022-10-20: qty 7

## 2022-10-20 MED ORDER — MORPHINE SULFATE ER 15 MG PO TBCR
15.0000 mg | EXTENDED_RELEASE_TABLET | Freq: Two times a day (BID) | ORAL | 0 refills | Status: DC
Start: 2022-10-20 — End: 2022-12-01

## 2022-10-20 MED ORDER — OXYCODONE HCL 5 MG PO TABS: 5.0000 mg | ORAL_TABLET | ORAL | 0 refills | Status: DC | PRN

## 2022-10-20 NOTE — Progress Notes (Signed)
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   Molecular studies by [id number] showed no actionable mutations and PD-L1 expression of 70%.  PRIOR THERAPY:  1) Palliative radiotherapy to the T8 and L4 metastatic bone disease. 2) ystemic chemotherapy with carboplatin for AUC of 5, paclitaxel 175 Mg/M2 and Libtayo (Cempilimab) 350 Mg IV every 3 weeks with Neulasta support for 4 cycles    CURRENT THERAPY: Maintenance treatment with single agent Libtayo (Cempilimab) 350 Mg IV every 3 weeks.  First dose September 08, 2022.  Status post 2 cycles.  INTERVAL HISTORY: Albert Hardy. 67 y.o. male returns to the clinic today for follow-up visit.  The patient is feeling fine today with no concerning complaints except for the fatigue and hypotension.  He is currently on blood pressure medication that were reduced in dose.  He denied having any current chest pain, shortness of breath except with exertion with no cough or hemoptysis.  He has no nausea, vomiting, diarrhea or constipation.  He has no headache or visual changes.  He denied having any fever or chills.  He is here today for evaluation before starting cycle #3 of his treatment.  MEDICAL HISTORY: Past Medical History:  Diagnosis Date   Aortic regurgitation    Aortic stenosis 04/04/2018   Atherosclerosis of native arteries of the extremities with intermittent claudication 09/08/2013   Bilateral impacted cerumen 10/13/2018   Bilateral sensorineural hearing loss 12/14/2018   Dyspnea    Former moderate cigarette smoker  (10-19 per day) 05/20/2012   Quit smoking Nov 2013 when hospitalized w/ HTN and chest pain(diagnosed w/ valvular heart disease)   GERD (gastroesophageal reflux disease)    Heart murmur    History of radiation therapy    Lumbar Spine, Thoracic Spine- 06/25/22-07/10/22- Dr. 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  MEDICATIONS:  Current Outpatient Medications  Medication Sig Dispense Refill   albuterol (VENTOLIN HFA) 108 (90 Base) MCG/ACT inhaler Inhale 2 puffs into the lungs every 6 (six) hours as needed for wheezing or shortness of breath. 8 g 6   apixaban (ELIQUIS) 5 MG TABS tablet Take 1 tablet (5 mg total) by mouth 2 (two) times daily. 60 tablet 2   Ascorbic Acid (VITAMIN C PO) Take 1 tablet by mouth daily.     Cholecalciferol (VITAMIN D3) 1000 UNITS CAPS Take 1,000 Units by mouth daily.     Coenzyme Q10 (CO Q 10 PO) Take 1 tablet by mouth daily.     Cyanocobalamin (VITAMIN B-12 PO) Take 1 tablet by mouth daily.     cyclobenzaprine (FLEXERIL) 5 MG tablet Take 1 tablet (5 mg total) by mouth 3 (three) times daily as needed for muscle spasms. 30 tablet 0   ezetimibe (ZETIA) 10 MG tablet Take 1 tablet (10 mg total) by  mouth daily. 90 tablet 3   fluticasone (FLONASE) 50 MCG/ACT nasal spray Place 1-2 sprays into both nostrils daily. 16 g 6   KRILL OIL PO Take 1 tablet by mouth daily.     lisinopril (ZESTRIL) 10 MG tablet Take 1 tablet (10 mg total) by mouth daily. 30 tablet 1   MAGNESIUM PO Take 1 tablet by mouth daily.     metoCLOPramide (REGLAN) 10 MG tablet Take 1 tablet (10 mg total) by mouth 4 (four) times daily -  before meals and at bedtime. 56 tablet 0   morphine (MS CONTIN) 15 MG 12 hr tablet Take 1 tablet (15 mg total) by mouth every  12 (twelve) hours. 60 tablet 0   Multiple Vitamins-Minerals (CENTRUM SILVER PO) Take 1 tablet by mouth daily.     Multiple Vitamins-Minerals (ZINC PO) Take 1 tablet by mouth daily.     Naphazoline-Pheniramine (OPCON-A) 0.027-0.315 % SOLN Place 1 drop into both eyes daily as needed (redness).     nitroGLYCERIN (NITROSTAT) 0.4 MG SL tablet Place 1 tablet (0.4 mg total) under the tongue every 5 (five) minutes as needed for chest pain. 25 tablet 1   omeprazole (PRILOSEC) 20 MG capsule Take 1 capsule (20 mg total) by mouth daily. 90 capsule 1   oxyCODONE (OXY IR/ROXICODONE) 5 MG immediate release tablet Take 1 tablet (5 mg total) by mouth every 4 (four) hours as needed for severe pain. 90 tablet 0   prochlorperazine (COMPAZINE) 10 MG tablet Take 1 tablet (10 mg total) by mouth every 6 (six) hours as needed for nausea or vomiting. 30 tablet 0   rosuvastatin (CRESTOR) 5 MG tablet Take 1 tablet (5 mg total) by mouth at bedtime. 90 tablet 1   TURMERIC PO Take 1 tablet by mouth 3 (three) times a week.     umeclidinium-vilanterol (ANORO ELLIPTA) 62.5-25 MCG/ACT AEPB Inhale 1 puff into the lungs daily. 180 each 3   No current facility-administered medications for this visit.    SURGICAL HISTORY:  Past Surgical History:  Procedure Laterality Date   BRONCHIAL BIOPSY  05/26/2022   Procedure: BRONCHIAL BIOPSIES;  Surgeon: Josephine Igo, DO;  Location: [facility];  Service: Pulmonary;;   BRONCHIAL BRUSHINGS  05/26/2022   Procedure: BRONCHIAL BRUSHINGS;  Surgeon: Josephine Igo, DO;  Location: [facility];  Service: Pulmonary;;   NO PAST SURGERIES     VIDEO BRONCHOSCOPY  05/26/2022   Procedure: VIDEO BRONCHOSCOPY WITHOUT FLUORO;  Surgeon: Josephine Igo, DO;  Location: [facility];  Service: Pulmonary;;    REVIEW OF SYSTEMS:  A comprehensive review of systems was negative.   PHYSICAL EXAMINATION: General appearance: alert, cooperative, and no distress Head: Normocephalic, without obvious  abnormality, atraumatic Neck: no adenopathy, no JVD, supple, symmetrical, trachea midline, and thyroid not enlarged, symmetric, no tenderness/mass/nodules Lymph nodes: Cervical, supraclavicular, and axillary nodes normal. Resp: clear to auscultation bilaterally Back: symmetric, no curvature. ROM normal. No CVA tenderness. Cardio: regular rate and rhythm, S1, S2 normal, no murmur, click, rub or gallop GI: soft, non-tender; bowel sounds normal; no masses,  no organomegaly Extremities: extremities normal, atraumatic, no cyanosis or edema  ECOG PERFORMANCE STATUS: 1 - Symptomatic but completely ambulatory  Blood pressure 112/70, pulse 96, temperature 100 F (37.8 C), temperature source Oral, resp. rate 16, height 5\' 9"  (1.753 m), weight 135 lb (61.2 kg), SpO2 100%.  LABORATORY DATA: Lab Results  Component Value Date   WBC 3.3 (L) 10/20/2022   HGB 10.6 (L) 10/20/2022   HCT 32.7 (  L) 10/20/2022   MCV 97.6 10/20/2022   PLT 259 10/20/2022      Chemistry      Component Value Date/Time   NA 136 10/01/2022 1236   NA 145 (H) 10/21/2021 1038   K 3.9 10/01/2022 1236   CL 99 10/01/2022 1236   CO2 28 10/01/2022 1236   BUN 24 (H) 10/01/2022 1236   BUN 19 10/21/2021 1038   CREATININE 1.17 10/01/2022 1236   CREATININE 1.34 (H) 09/29/2022 0925   CREATININE 1.07 08/27/2014 1511      Component Value Date/Time   CALCIUM 9.3 10/01/2022 1236   ALKPHOS 53 09/29/2022 0925   AST 12 (L) 09/29/2022 0925   ALT 11 09/29/2022 0925   BILITOT 0.4 09/29/2022 0925       RADIOGRAPHIC STUDIES: No results found.  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  Molecular studies by  [id number] showed no actionable mutations and PD-L1 expression of 70%. The patient underwent palliative combination of systemic chemotherapy with carboplatin for AUC of 5, paclitaxel 175 Mg/M2 and immunotherapy with Libtayo (Cempilimab) 350 Mg IV every 3 weeks with Neulasta support for 4 cycles followed by maintenance treatment with Libtayo (Cempilimab) of the patient has no disease progression after cycle #4.  He is status post 4 cycles. He is currently undergoing maintenance treatment with single agent Libtayo (Cempilimab) 350 Mg IV every 3 weeks status post 2 cycles. He is tolerating this treatment fairly well with no concerning adverse effects. I recommended for him to proceed with cycle #3 today as planned. I will see him back for follow-up visit in 3 weeks for evaluation with repeat CT scan of the chest, abdomen and pelvis for restaging of his disease. For the back pain he underwent palliative radiotherapy to the bone disease at T8 and L4 vertebral lesions. For the bone metastasis, he is currently on treatment with Xgeva 120 mg subcutaneously every 6 weeks. The patient was advised to call immediately if he has any other concerning symptoms in the interval. The patient voices understanding of current disease status and treatment options and is in agreement with the current care plan.  All questions were answered. The patient knows to call the clinic with any problems, questions or concerns. We can certainly see the patient much sooner if necessary.  The total time spent in the appointment was 20 minutes.  Disclaimer: This note was dictated with voice recognition software. Similar sounding words can inadvertently be transcribed and may not be corrected upon review.

## 2022-10-20 NOTE — Patient Instructions (Signed)
[facility]  Discharge Instructions: Thank you for choosing [facility] to provide your oncology and hematology care.   If you have a lab appointment with the Cancer Center, please go directly to the Cancer Center and check in at the registration area.   Wear comfortable clothing and clothing appropriate for easy access to any Portacath or PICC line.   We strive to give you quality time with your provider. You may need to reschedule your appointment if you arrive late (15 or more minutes).  Arriving late affects you and other patients whose appointments are after yours.  Also, if you miss three or more appointments without notifying the office, you may be dismissed from the clinic at the provider's discretion.      For prescription refill requests, have your pharmacy contact our office and allow 72 hours for refills to be completed.    Today you received the following chemotherapy and/or immunotherapy agents: Libtayo.       To help prevent nausea and vomiting after your treatment, we encourage you to take your nausea medication as directed.  BELOW ARE SYMPTOMS THAT SHOULD BE REPORTED IMMEDIATELY: *FEVER GREATER THAN 100.4 F (38 C) OR HIGHER *CHILLS OR SWEATING *NAUSEA AND VOMITING THAT IS NOT CONTROLLED WITH YOUR NAUSEA MEDICATION *UNUSUAL SHORTNESS OF BREATH *UNUSUAL BRUISING OR BLEEDING *URINARY PROBLEMS (pain or burning when urinating, or frequent urination) *BOWEL PROBLEMS (unusual diarrhea, constipation, pain near the anus) TENDERNESS IN MOUTH AND THROAT WITH OR WITHOUT PRESENCE OF ULCERS (sore throat, sores in mouth, or a toothache) UNUSUAL RASH, SWELLING OR PAIN  UNUSUAL VAGINAL DISCHARGE OR ITCHING   Items with * indicate a potential emergency and should be followed up as soon as possible or go to the Emergency Department if any problems should occur.  Please show the CHEMOTHERAPY ALERT CARD or IMMUNOTHERAPY ALERT CARD at  check-in to the Emergency Department and triage nurse.  Should you have questions after your visit or need to cancel or reschedule your appointment, please contact [facility]  Dept: [phone]  and follow the prompts.  Office hours are 8:00 a.m. to 4:30 p.m. Monday - Friday. Please note that voicemails left after 4:00 p.m. may not be returned until the following business day.  We are closed weekends and major holidays. You have access to a nurse at all times for urgent questions. Please call the main number to the clinic Dept: [phone] and follow the prompts.   For any non-urgent questions, you may also contact your provider using MyChart. We now offer e-Visits for anyone 18 and older to request care online for non-urgent symptoms. For details visit [email or web address].   Also download the MyChart app! Go to the app store, search "MyChart", open the app, select [place], and log in with your MyChart username and password.   

## 2022-10-21 LAB — T4: T4, Total: 8.1 ug/dL (ref 4.5–12.0)

## 2022-11-03 ENCOUNTER — Ambulatory Visit (HOSPITAL_COMMUNITY)
Admission: RE | Admit: 2022-11-03 | Discharge: 2022-11-03 | Disposition: A | Discharge status: Home / Self Care | Payer: Medicare Other | Source: Ambulatory Visit | Attending: Internal Medicine | Admitting: Internal Medicine

## 2022-11-03 ENCOUNTER — Other Ambulatory Visit: Payer: Self-pay | Admitting: Internal Medicine

## 2022-11-03 DIAGNOSIS — C3492 Malignant neoplasm of unspecified part of left bronchus or lung: Secondary | ICD-10-CM

## 2022-11-03 DIAGNOSIS — C3490 Malignant neoplasm of unspecified part of unspecified bronchus or lung: Secondary | ICD-10-CM | POA: Insufficient documentation

## 2022-11-03 MED ORDER — IOHEXOL 300 MG/ML  SOLN
100.0000 mL | Freq: Once | INTRAMUSCULAR | Status: AC | PRN
  Administered 2022-11-03: 100 mL via INTRAVENOUS

## 2022-11-10 ENCOUNTER — Inpatient Hospital Stay: Payer: Medicare Other | Attending: Internal Medicine

## 2022-11-10 ENCOUNTER — Other Ambulatory Visit: Payer: Medicare Other

## 2022-11-10 ENCOUNTER — Inpatient Hospital Stay (HOSPITAL_BASED_OUTPATIENT_CLINIC_OR_DEPARTMENT_OTHER): Payer: Medicare Other | Admitting: Internal Medicine

## 2022-11-10 ENCOUNTER — Inpatient Hospital Stay: Payer: Medicare Other

## 2022-11-10 VITALS — BP 153/63 | HR 60 | Resp 17

## 2022-11-10 DIAGNOSIS — C3492 Malignant neoplasm of unspecified part of left bronchus or lung: Secondary | ICD-10-CM

## 2022-11-10 DIAGNOSIS — C7951 Secondary malignant neoplasm of bone: Secondary | ICD-10-CM | POA: Diagnosis present

## 2022-11-10 DIAGNOSIS — Z5112 Encounter for antineoplastic immunotherapy: Secondary | ICD-10-CM | POA: Insufficient documentation

## 2022-11-10 DIAGNOSIS — Z87891 Personal history of nicotine dependence: Secondary | ICD-10-CM | POA: Insufficient documentation

## 2022-11-10 DIAGNOSIS — C3412 Malignant neoplasm of upper lobe, left bronchus or lung: Secondary | ICD-10-CM | POA: Insufficient documentation

## 2022-11-10 LAB — CMP (CANCER CENTER ONLY)
ALT: 12 U/L (ref 0–44)
AST: 15 U/L (ref 15–41)
Albumin: 3.8 g/dL (ref 3.5–5.0)
Alkaline Phosphatase: 52 U/L (ref 38–126)
Anion gap: 10 (ref 5–15)
BUN: 10 mg/dL (ref 8–23)
CO2: 22 mmol/L (ref 22–32)
Calcium: 8.8 mg/dL — ABNORMAL LOW (ref 8.9–10.3)
Chloride: 110 mmol/L (ref 98–111)
Creatinine: 0.97 mg/dL (ref 0.61–1.24)
GFR, Estimated: >60 mL/min
Glucose, Bld: 94 mg/dL (ref 70–99)
Potassium: 3.5 mmol/L (ref 3.5–5.1)
Sodium: 142 mmol/L (ref 135–145)
Total Bilirubin: 0.3 mg/dL (ref 0.3–1.2)
Total Protein: 7 g/dL (ref 6.5–8.1)

## 2022-11-10 LAB — CBC WITH DIFFERENTIAL (CANCER CENTER ONLY)
Abs Immature Granulocytes: 0 10*3/uL (ref 0.00–0.07)
Basophils Absolute: 0 10*3/uL (ref 0.0–0.1)
Basophils Relative: 0 %
Eosinophils Absolute: 0 10*3/uL (ref 0.0–0.5)
Eosinophils Relative: 0 %
HCT: 31.6 % — ABNORMAL LOW (ref 39.0–52.0)
Hemoglobin: 10.1 g/dL — ABNORMAL LOW (ref 13.0–17.0)
Immature Granulocytes: 0 %
Lymphocytes Relative: 31 %
Lymphs Abs: 1 10*3/uL (ref 0.7–4.0)
MCH: 31 pg (ref 26.0–34.0)
MCHC: 32 g/dL (ref 30.0–36.0)
MCV: 96.9 fL (ref 80.0–100.0)
Monocytes Absolute: 0.4 10*3/uL (ref 0.1–1.0)
Monocytes Relative: 12 %
Neutro Abs: 1.7 10*3/uL (ref 1.7–7.7)
Neutrophils Relative %: 57 %
Platelet Count: 230 10*3/uL (ref 150–400)
RBC: 3.26 MIL/uL — ABNORMAL LOW (ref 4.22–5.81)
RDW: 13.5 % (ref 11.5–15.5)
WBC Count: 3.1 10*3/uL — ABNORMAL LOW (ref 4.0–10.5)
nRBC: 0 % (ref 0.0–0.2)

## 2022-11-10 MED ORDER — SODIUM CHLORIDE 0.9 % IV SOLN
350.0000 mg | Freq: Once | INTRAVENOUS | Status: AC
  Administered 2022-11-10: 350 mg via INTRAVENOUS
  Filled 2022-11-10: qty 7

## 2022-11-10 MED ORDER — DENOSUMAB 120 MG/1.7ML ~~LOC~~ SOLN
120.0000 mg | Freq: Once | SUBCUTANEOUS | Status: AC
  Administered 2022-11-10: 120 mg via SUBCUTANEOUS
  Filled 2022-11-10: qty 1.7

## 2022-11-10 MED ORDER — SODIUM CHLORIDE 0.9 % IV SOLN: Freq: Once | INTRAVENOUS | Status: AC

## 2022-11-10 NOTE — Progress Notes (Signed)
Ok to proceed with Albert Hardy with Corrected calcium 8.9

## 2022-11-10 NOTE — Progress Notes (Signed)
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   Molecular studies by [id number] showed no actionable mutations and PD-L1 expression of 70%.  PRIOR THERAPY:  1) Palliative radiotherapy to the T8 and L4 metastatic bone disease. 2) ystemic chemotherapy with carboplatin for AUC of 5, paclitaxel 175 Mg/M2 and Libtayo (Cempilimab) 350 Mg IV every 3 weeks with Neulasta support for 4 cycles    CURRENT THERAPY: Maintenance treatment with single agent Libtayo (Cempilimab) 350 Mg IV every 3 weeks.  First dose September 08, 2022.  Status post 3 cycles.  INTERVAL HISTORY: Albert Hardy. 67 y.o. male returns to the clinic today for follow-up visit.Discussed the use of AI scribe software for clinical note transcription with the patient, who gave verbal consent to proceed.  History of Present Illness   The patient, a 67 year old with a diagnosis of stage 4 non-small cell lung cancer (squamous cell carcinoma), presents for a follow-up visit. He has been undergoing immunotherapy, having completed 4 cycles of chemotherapy with carboplatin, paclitaxel, and Libtayo, and is currently on maintenance Libtayo every three months.  The patient reports feeling "about the same" as before, with no significant changes in his overall health. He experienced a brief episode of right hip pain, which started in the back and radiated down to the leg over two days before resolving on its own. The patient did not take any additional medication for this pain, continuing only with his prescribed morphine and  oxycodone.  The patient also reports occasional episodes of labored breathing, which he noticed particularly when in a humid environment, such as during a shower. He denies any other symptoms such as nausea, vomiting, diarrhea, headaches, or changes in vision. His weight has increased by five pounds over the past three weeks.  The patient's recent scan showed an increase in size of a right lower paratracheal lymph node and soft tissue thickening at the site of the original left hilar mass.        MEDICAL HISTORY: Past Medical History:  Diagnosis Date   Aortic regurgitation    Aortic stenosis 04/04/2018   Atherosclerosis of native arteries of the extremities with intermittent claudication 09/08/2013   Bilateral impacted cerumen 10/13/2018   Bilateral sensorineural hearing loss 12/14/2018   Dyspnea    Former moderate cigarette smoker (10-19 per day) 05/20/2012   Quit smoking Nov 2013 when hospitalized w/ HTN and chest pain(diagnosed w/ valvular heart disease)   GERD (gastroesophageal reflux disease)    Heart murmur    History of radiation therapy    Lumbar Spine, Thoracic Spine- 06/25/22-07/10/22- Dr. 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  MEDICATIONS:  Current Outpatient Medications  Medication Sig Dispense Refill   albuterol (VENTOLIN HFA) 108 (90 Base) MCG/ACT inhaler Inhale 2 puffs into the lungs every 6 (six)  hours as needed for wheezing or shortness of breath. 8 g 6   apixaban (ELIQUIS) 5 MG TABS tablet Take 1 tablet (5 mg total) by mouth 2 (two) times daily. 60 tablet 2   Ascorbic Acid (VITAMIN C PO) Take 1 tablet by mouth daily.     Cholecalciferol (VITAMIN D3) 1000 UNITS CAPS Take 1,000 Units by mouth daily.     Coenzyme  Q10 (CO Q 10 PO) Take 1 tablet by mouth daily.     Cyanocobalamin (VITAMIN B-12 PO) Take 1 tablet by mouth daily.     cyclobenzaprine (FLEXERIL) 5 MG tablet Take 1 tablet (5 mg total) by mouth 3 (three) times daily as needed for muscle spasms. 30 tablet 0   ezetimibe (ZETIA) 10 MG tablet Take 1 tablet (10 mg total) by mouth daily. 90 tablet 3   fluticasone (FLONASE) 50 MCG/ACT nasal spray Place 1-2 sprays into both nostrils daily. 16 g 6   KRILL OIL PO Take 1 tablet by mouth daily.     lisinopril (ZESTRIL) 10 MG tablet Take 1 tablet (10 mg total) by mouth daily. 30 tablet 1   MAGNESIUM PO Take 1 tablet by mouth daily.     metoCLOPramide (REGLAN) 10 MG tablet Take 1 tablet (10 mg total) by mouth 4 (four) times daily -  before meals and at bedtime. 56 tablet 0   morphine (MS CONTIN) 15 MG 12 hr tablet Take 1 tablet (15 mg total) by mouth every 12 (twelve) hours. 60 tablet 0   Multiple Vitamins-Minerals (CENTRUM SILVER PO) Take 1 tablet by mouth daily.     Multiple Vitamins-Minerals (ZINC PO) Take 1 tablet by mouth daily.     Naphazoline-Pheniramine (OPCON-A) 0.027-0.315 % SOLN Place 1 drop into both eyes daily as needed (redness).     nitroGLYCERIN (NITROSTAT) 0.4 MG SL tablet Place 1 tablet (0.4 mg total) under the tongue every 5 (five) minutes as needed for chest pain. 25 tablet 1   omeprazole (PRILOSEC) 20 MG capsule Take 1 capsule (20 mg total) by mouth daily. 90 capsule 1   oxyCODONE (OXY IR/ROXICODONE) 5 MG immediate release tablet Take 1 tablet (5 mg total) by mouth every 4 (four) hours as needed for severe pain. 90 tablet 0   prochlorperazine (COMPAZINE) 10 MG tablet Take 1 tablet (10 mg total) by mouth every 6 (six) hours as needed for nausea or vomiting. 30 tablet 0   rosuvastatin (CRESTOR) 5 MG tablet Take 1 tablet (5 mg total) by mouth at bedtime. 90 tablet 1   TURMERIC PO Take 1 tablet by mouth 3 (three) times a week.     umeclidinium-vilanterol (ANORO ELLIPTA) 62.5-25 MCG/ACT AEPB  Inhale 1 puff into the lungs daily. 180 each 3   No current facility-administered medications for this visit.    SURGICAL HISTORY:  Past Surgical History:  Procedure Laterality Date   BRONCHIAL BIOPSY  05/26/2022   Procedure: BRONCHIAL BIOPSIES;  Surgeon: Josephine Igo, DO;  Location: [facility];  Service: Pulmonary;;   BRONCHIAL BRUSHINGS  05/26/2022   Procedure: BRONCHIAL BRUSHINGS;  Surgeon: Josephine Igo, DO;  Location: [facility];  Service: Pulmonary;;   NO PAST SURGERIES     VIDEO BRONCHOSCOPY  05/26/2022   Procedure: VIDEO BRONCHOSCOPY WITHOUT FLUORO;  Surgeon: Josephine Igo, DO;  Location: [facility];  Service: Pulmonary;;    REVIEW OF SYSTEMS:  Constitutional: positive for fatigue Eyes: negative Ears, nose, mouth, throat, and face: negative Respiratory: positive for dyspnea on exertion Cardiovascular: negative Gastrointestinal:  negative Genitourinary:negative Integument/breast: negative Hematologic/lymphatic: negative Musculoskeletal:positive for arthralgias Neurological: negative Behavioral/Psych: negative Endocrine: negative Allergic/Immunologic: negative   PHYSICAL EXAMINATION: General appearance: alert, cooperative, and no distress Head: Normocephalic, without obvious abnormality, atraumatic Neck: no adenopathy, no JVD, supple, symmetrical, trachea midline, and thyroid not enlarged, symmetric, no tenderness/mass/nodules Lymph nodes: Cervical, supraclavicular, and axillary nodes normal. Resp: clear to auscultation bilaterally Back: symmetric, no curvature. ROM normal. No CVA tenderness. Cardio: regular rate and rhythm, S1, S2 normal, no murmur, click, rub or gallop GI: soft, non-tender; bowel sounds normal; no masses,  no organomegaly Extremities: extremities normal, atraumatic, no cyanosis or edema Neurologic: Alert and oriented X 3, normal strength and tone. Normal symmetric reflexes. Normal coordination and gait  ECOG PERFORMANCE STATUS: 1 -  Symptomatic but completely ambulatory  Blood pressure (!) 158/68, pulse 71, temperature 98.3 F (36.8 C), temperature source Oral, resp. rate 18, height 5\' 9"  (1.753 m), weight 140 lb 11.2 oz (63.8 kg), SpO2 100%.  LABORATORY DATA: Lab Results  Component Value Date   WBC 3.1 (L) 11/10/2022   HGB 10.1 (L) 11/10/2022   HCT 31.6 (L) 11/10/2022   MCV 96.9 11/10/2022   PLT 230 11/10/2022      Chemistry      Component Value Date/Time   NA 141 10/20/2022 0916   NA 145 (H) 10/21/2021 1038   K 3.8 10/20/2022 0916   CL 107 10/20/2022 0916   CO2 24 10/20/2022 0916   BUN 14 10/20/2022 0916   BUN 19 10/21/2021 1038   CREATININE 1.06 10/20/2022 0916   CREATININE 1.07 08/27/2014 1511      Component Value Date/Time   CALCIUM 9.3 10/20/2022 0916   ALKPHOS 56 10/20/2022 0916   AST 15 10/20/2022 0916   ALT 12 10/20/2022 0916   BILITOT 0.4 10/20/2022 0916       RADIOGRAPHIC STUDIES: CT CHEST ABDOMEN PELVIS W CONTRAST  Result Date: 11/10/2022 CLINICAL DATA:  Non-small cell lung cancer restaging * Tracking Code: BO * EXAM: CT CHEST, ABDOMEN, AND PELVIS WITH CONTRAST TECHNIQUE: Multidetector CT imaging of the chest, abdomen and pelvis was performed following the standard protocol during bolus administration of intravenous contrast. RADIATION DOSE REDUCTION: This exam was performed according to the departmental dose-optimization program which includes automated exposure control, adjustment of the mA and/or kV according to patient size and/or use of iterative reconstruction technique. CONTRAST:  OMNIPAQUE IOHEXOL 300 MG/ML  SOLN COMPARISON:  08/06/2022 FINDINGS: CT CHEST FINDINGS Cardiovascular: Atherosclerotic calcification of the aortic arch. Faint calcification of the aortic valve. Trace pericardial effusion. Mediastinum/Nodes: Right lower paratracheal node 0.8 cm in short axis on image 26 series 2, formerly 0.4 cm. Lungs/Pleura: Paramediastinal stranding in volume loss probably related to  prior palliative radiotherapy to the T8 metastatic lesion. At the site of the original left hilar mass, there is some increased soft tissue thickening up to 1.3 cm in diameter on image 31 of series 2, previously 0.7 cm on 08/06/2022, raising some concern for worsening. Reocclusion of the apicoposterior segmental bronchus in the left upper lobe. New ground-glass density nodule in the superior segment left lower lobe measures 0.8 by 0.6 cm on image 90 of series 4. Given that this was not present 3 months ago and is most likely due to local alveolitis. Surveillance suggested. The small nodule in the posterior basal segment right lower lobe along the costophrenic angle has resolved. New small localized left pleural effusion medially on image 43 series 2. Musculoskeletal: Increased sclerosis within along the left T8 metastatic  lesion with reduced soft tissue extraosseous component suggesting response to therapy. No new bony lesions in the thorax identified. CT ABDOMEN PELVIS FINDINGS Hepatobiliary: Mildly contracted gallbladder. Otherwise unremarkable. Pancreas: Unremarkable Spleen: Unremarkable Adrenals/Urinary Tract: Hyperdense nonspecific 0.8 cm exophytic lesion of the left kidney lower pole could be a complex cyst or small mass. Otherwise unremarkable. Stomach/Bowel: Unremarkable Vascular/Lymphatic: Atherosclerosis is present, including aortoiliac atherosclerotic disease. Reproductive: Prostatomegaly. Other: Small amount of free pelvic fluid. Musculoskeletal: Mildly increased sclerosis along the margins of the right L4 lytic lesion. The margins of this lesion are difficult to assess due to the peripherally tapering density of the sclerosis, but the lucent portion of the lesion measures 2.4 by 1.9 cm, formerly 2.6 by 2.0 cm. Degenerative spurring in both hips. Lumbar spondylosis and degenerative disc disease causing multilevel lumbar impingement. IMPRESSION: 1. Increased soft tissue thickening at the site of the  original left hilar mass, with reocclusion of the apicoposterior segmental bronchus of the left upper lobe, raising some concern for local worsening. 2. New ground-glass density nodule in the superior segment left lower lobe measures 0.8 by 0.6 cm. Given that this was not present 3 months ago and is most likely due to local alveolitis. Surveillance suggested. 3. New small localized left pleural effusion medially. 4. Increased sclerosis within and along the left T8 and L4 metastatic lesions. Reduced soft tissue extraosseous component at T8. 5. Prostatomegaly. 6. Hyperdense nonspecific 8 mm exophytic lesion from the left kidney lower pole is stable, possibilities include complex cyst or small mass. Surveillance on follow up suggested. 7. Aortic atherosclerosis. Aortic Atherosclerosis (ICD10-I70.0). Electronically Signed   By: Gaylyn Rong M.D.   On: 11/10/2022 09:37    ASSESSMENT AND PLAN: This is a very pleasant 67 years old African-American male with Stage IV (T2a, N2, M1b) non-small cell lung cancer, squamous cell carcinoma presented with left upper lobe lung mass in addition to AP window lymphadenopathy and suspicious bone metastasis to the T8 and L4 vertebrae as well as right retroperitoneal metastatic nodule diagnosed in April 2024.  Molecular studies by [id number] showed no actionable mutations and PD-L1 expression of 70%. The patient underwent palliative combination of systemic chemotherapy with carboplatin for AUC of 5, paclitaxel 175 Mg/M2 and immunotherapy with Libtayo (Cempilimab) 350 Mg IV every 3 weeks with Neulasta support for 4 cycles followed by maintenance treatment with Libtayo (Cempilimab) of the patient has no disease progression after cycle #4.  He is status post 4 cycles. He is currently undergoing maintenance treatment with single agent Libtayo (Cempilimab) 350 Mg IV every 3 weeks status post 3 cycles.  The patient has been tolerating this treatment fairly well. He had repeat CT  scan of the chest, abdomen and pelvis performed recently.  I personally and independently reviewed the scan images and discussed the result with the patient today. His scan showed increasing soft tissue thickening at the site of the original left hilar mass with reocclusion of the apicoposterior segmental bronchus of the left upper lobe raising concern for local worsening there was also a new groundglass density in the superior segment of the left lower lobe measuring 0.8 x 0.6 cm suspicious for inflammatory process and there was increased sclerosis of the left T8 and L4 metastatic lesions but there was reduced soft tissue extraosseous component at T8.    Stage 4 Non-Small Cell Lung Cancer (Squamous Cell Carcinoma) Patient has completed 4 cycles of chemotherapy with Carboplatin, Paclitaxel, and Libtayo and is currently on maintenance Libtayo every three months. Recent imaging shows  an increase in size of the original left hilar mass and a right lower paratracheal lymph node, suggesting possible disease progression. No new symptoms reported. -Continue with cycle 4 of maintenance Libtayo. -Monitor for any new symptoms or changes in condition. -Consider referral to radiation oncology for targeted treatment of concerning hilar mass if symptoms worsen or further progression is noted on imaging.  Hip Pain Brief episode of right hip pain that resolved without intervention. -Monitor for recurrence.  Breathing Difficulty Occasional labored breathing, particularly in humid conditions. -Monitor for worsening or persistent symptoms.  Follow-up in three weeks.   The patient was advised to call immediately if he has any other concerning symptoms in the interval. For the back pain he underwent palliative radiotherapy to the bone disease at T8 and L4 vertebral lesions. For the bone metastasis, he is currently on treatment with Xgeva 120 mg subcutaneously every 6 weeks.  The patient voices understanding of  current disease status and treatment options and is in agreement with the current care plan.  All questions were answered. The patient knows to call the clinic with any problems, questions or concerns. We can certainly see the patient much sooner if necessary.  The total time spent in the appointment was 30 minutes.  Disclaimer: This note was dictated with voice recognition software. Similar sounding words can inadvertently be transcribed and may not be corrected upon review.

## 2022-11-10 NOTE — Patient Instructions (Signed)
[facility]  Discharge Instructions: Thank you for choosing [facility] to provide your oncology and hematology care.   If you have a lab appointment with the Cancer Center, please go directly to the Cancer Center and check in at the registration area.   Wear comfortable clothing and clothing appropriate for easy access to any Portacath or PICC line.   We strive to give you quality time with your provider. You may need to reschedule your appointment if you arrive late (15 or more minutes).  Arriving late affects you and other patients whose appointments are after yours.  Also, if you miss three or more appointments without notifying the office, you may be dismissed from the clinic at the provider's discretion.      For prescription refill requests, have your pharmacy contact our office and allow 72 hours for refills to be completed.    Today you received the following chemotherapy and/or immunotherapy agents: Libtayo.       To help prevent nausea and vomiting after your treatment, we encourage you to take your nausea medication as directed.  BELOW ARE SYMPTOMS THAT SHOULD BE REPORTED IMMEDIATELY: *FEVER GREATER THAN 100.4 F (38 C) OR HIGHER *CHILLS OR SWEATING *NAUSEA AND VOMITING THAT IS NOT CONTROLLED WITH YOUR NAUSEA MEDICATION *UNUSUAL SHORTNESS OF BREATH *UNUSUAL BRUISING OR BLEEDING *URINARY PROBLEMS (pain or burning when urinating, or frequent urination) *BOWEL PROBLEMS (unusual diarrhea, constipation, pain near the anus) TENDERNESS IN MOUTH AND THROAT WITH OR WITHOUT PRESENCE OF ULCERS (sore throat, sores in mouth, or a toothache) UNUSUAL RASH, SWELLING OR PAIN  UNUSUAL VAGINAL DISCHARGE OR ITCHING   Items with * indicate a potential emergency and should be followed up as soon as possible or go to the Emergency Department if any problems should occur.  Please show the CHEMOTHERAPY ALERT CARD or IMMUNOTHERAPY ALERT CARD at  check-in to the Emergency Department and triage nurse.  Should you have questions after your visit or need to cancel or reschedule your appointment, please contact [facility]  Dept: [phone]  and follow the prompts.  Office hours are 8:00 a.m. to 4:30 p.m. Monday - Friday. Please note that voicemails left after 4:00 p.m. may not be returned until the following business day.  We are closed weekends and major holidays. You have access to a nurse at all times for urgent questions. Please call the main number to the clinic Dept: [phone] and follow the prompts.   For any non-urgent questions, you may also contact your provider using MyChart. We now offer e-Visits for anyone 18 and older to request care online for non-urgent symptoms. For details visit [email or web address].   Also download the MyChart app! Go to the app store, search "MyChart", open the app, select [place], and log in with your MyChart username and password.   

## 2022-11-11 ENCOUNTER — Ambulatory Visit: Payer: Medicare Other | Attending: Cardiology

## 2022-11-11 DIAGNOSIS — I209 Angina pectoris, unspecified: Secondary | ICD-10-CM | POA: Insufficient documentation

## 2022-11-11 DIAGNOSIS — I10 Essential (primary) hypertension: Secondary | ICD-10-CM | POA: Insufficient documentation

## 2022-11-11 DIAGNOSIS — I350 Nonrheumatic aortic (valve) stenosis: Secondary | ICD-10-CM | POA: Insufficient documentation

## 2022-11-11 LAB — ECHOCARDIOGRAM COMPLETE
AR max vel: 3.17 cm2
AV Area VTI: 2.9 cm2
AV Area mean vel: 2.74 cm2
AV Mean grad: 8.8 mm[Hg]
AV Peak grad: 16.4 mm[Hg]
Ao pk vel: 2.03 m/s
Area-P 1/2: 3.95 cm2
MV M vel: 4.94 m/s
MV Peak grad: 97.6 mm[Hg]
P 1/2 time: 408 ms
S' Lateral: 3.5 cm

## 2022-11-12 ENCOUNTER — Ambulatory Visit (INDEPENDENT_AMBULATORY_CARE_PROVIDER_SITE_OTHER): Payer: Medicare Other | Admitting: *Deleted

## 2022-11-12 DIAGNOSIS — Z0000 Encounter for general adult medical examination without abnormal findings: Secondary | ICD-10-CM | POA: Diagnosis not present

## 2022-11-12 NOTE — Patient Instructions (Signed)
Albert Hardy , Thank you for taking time to come for your Medicare Wellness Visit. I appreciate your ongoing commitment to your health goals. Please review the following plan we discussed and let me know if I can assist you in the future.   Screening recommendations/referrals: Colonoscopy: up to date Recommended yearly ophthalmology/optometry visit for glaucoma screening and checkup Recommended yearly dental visit for hygiene and checkup  Vaccinations: Influenza vaccine: Education provided Pneumococcal vaccine: up to date Tdap vaccine: up to date Shingles vaccine: Education provided    Advanced directives: Education provided    Preventive Care 65 Years and Older, Male Preventive care refers to lifestyle choices and visits with your health care provider that can promote health and wellness. What does preventive care include? A yearly physical exam. This is also called an annual well check. Dental exams once or twice a year. Routine eye exams. Ask your health care provider how often you should have your eyes checked. Personal lifestyle choices, including: Daily care of your teeth and gums. Regular physical activity. Eating a healthy diet. Avoiding tobacco and drug use. Limiting alcohol use. Practicing safe sex. Taking low doses of aspirin every day. Taking vitamin and mineral supplements as recommended by your health care provider. What happens during an annual well check? The services and screenings done by your health care provider during your annual well check will depend on your age, overall health, lifestyle risk factors, and family history of disease. Counseling  Your health care provider may ask you questions about your: Alcohol use. Tobacco use. Drug use. Emotional well-being. Home and relationship well-being. Sexual activity. Eating habits. History of falls. Memory and ability to understand (cognition). Work and work Astronomer. Screening  You may have the  following tests or measurements: Height, weight, and BMI. Blood pressure. Lipid and cholesterol levels. These may be checked every 5 years, or more frequently if you are over 51 years old. Skin check. Lung cancer screening. You may have this screening every year starting at age 12 if you have a 30-pack-year history of smoking and currently smoke or have quit within the past 15 years. Fecal occult blood test (FOBT) of the stool. You may have this test every year starting at age 61. Flexible sigmoidoscopy or colonoscopy. You may have a sigmoidoscopy every 5 years or a colonoscopy every 10 years starting at age 42. Prostate cancer screening. Recommendations will vary depending on your family history and other risks. Hepatitis C blood test. Hepatitis B blood test. Sexually transmitted disease (STD) testing. Diabetes screening. This is done by checking your blood sugar (glucose) after you have not eaten for a while (fasting). You may have this done every 1-3 years. Abdominal aortic aneurysm (AAA) screening. You may need this if you are a current or former smoker. Osteoporosis. You may be screened starting at age 31 if you are at high risk. Talk with your health care provider about your test results, treatment options, and if necessary, the need for more tests. Vaccines  Your health care provider may recommend certain vaccines, such as: Influenza vaccine. This is recommended every year. Tetanus, diphtheria, and acellular pertussis (Tdap, Td) vaccine. You may need a Td booster every 10 years. Zoster vaccine. You may need this after age 52. Pneumococcal 13-valent conjugate (PCV13) vaccine. One dose is recommended after age 25. Pneumococcal polysaccharide (PPSV23) vaccine. One dose is recommended after age 53. Talk to your health care provider about which screenings and vaccines you need and how often you need them. This information  is not intended to replace advice given to you by your health care  provider. Make sure you discuss any questions you have with your health care provider. Document Released: 02/22/2015 Document Revised: 10/16/2015 Document Reviewed: 11/27/2014 Elsevier Interactive Patient Education  2017 [employer].  Fall Prevention in the Home Falls can cause injuries. They can happen to people of all ages. There are many things you can do to make your home safe and to help prevent falls. What can I do on the outside of my home? Regularly fix the edges of walkways and driveways and fix any cracks. Remove anything that might make you trip as you walk through a door, such as a raised step or threshold. Trim any bushes or trees on the path to your home. Use bright outdoor lighting. Clear any walking paths of anything that might make someone trip, such as rocks or tools. Regularly check to see if handrails are loose or broken. Make sure that both sides of any steps have handrails. Any raised decks and porches should have guardrails on the edges. Have any leaves, snow, or ice cleared regularly. Use sand or salt on walking paths during winter. Clean up any spills in your garage right away. This includes oil or grease spills. What can I do in the bathroom? Use night lights. Install grab bars by the toilet and in the tub and shower. Do not use towel bars as grab bars. Use non-skid mats or decals in the tub or shower. If you need to sit down in the shower, use a plastic, non-slip stool. Keep the floor dry. Clean up any water that spills on the floor as soon as it happens. Remove soap buildup in the tub or shower regularly. Attach bath mats securely with double-sided non-slip rug tape. Do not have throw rugs and other things on the floor that can make you trip. What can I do in the bedroom? Use night lights. Make sure that you have a light by your bed that is easy to reach. Do not use any sheets or blankets that are too big for your bed. They should not hang down onto the  floor. Have a firm chair that has side arms. You can use this for support while you get dressed. Do not have throw rugs and other things on the floor that can make you trip. What can I do in the kitchen? Clean up any spills right away. Avoid walking on wet floors. Keep items that you use a lot in easy-to-reach places. If you need to reach something above you, use a strong step stool that has a grab bar. Keep electrical cords out of the way. Do not use floor polish or wax that makes floors slippery. If you must use wax, use non-skid floor wax. Do not have throw rugs and other things on the floor that can make you trip. What can I do with my stairs? Do not leave any items on the stairs. Make sure that there are handrails on both sides of the stairs and use them. Fix handrails that are broken or loose. Make sure that handrails are as long as the stairways. Check any carpeting to make sure that it is firmly attached to the stairs. Fix any carpet that is loose or worn. Avoid having throw rugs at the top or bottom of the stairs. If you do have throw rugs, attach them to the floor with carpet tape. Make sure that you have a light switch at the top of  the stairs and the bottom of the stairs. If you do not have them, ask someone to add them for you. What else can I do to help prevent falls? Wear shoes that: Do not have high heels. Have rubber bottoms. Are comfortable and fit you well. Are closed at the toe. Do not wear sandals. If you use a stepladder: Make sure that it is fully opened. Do not climb a closed stepladder. Make sure that both sides of the stepladder are locked into place. Ask someone to hold it for you, if possible. Clearly mark and make sure that you can see: Any grab bars or handrails. First and last steps. Where the edge of each step is. Use tools that help you move around (mobility aids) if they are needed. These include: Canes. Walkers. Scooters. Crutches. Turn on the  lights when you go into a dark area. Replace any light bulbs as soon as they burn out. Set up your furniture so you have a clear path. Avoid moving your furniture around. If any of your floors are uneven, fix them. If there are any pets around you, be aware of where they are. Review your medicines with your doctor. Some medicines can make you feel dizzy. This can increase your chance of falling. Ask your doctor what other things that you can do to help prevent falls. This information is not intended to replace advice given to you by your health care provider. Make sure you discuss any questions you have with your health care provider. Document Released: 11/22/2008 Document Revised: 07/04/2015 Document Reviewed: 03/02/2014 Elsevier Interactive Patient Education  2017 [employer].

## 2022-11-12 NOTE — Progress Notes (Signed)
Subjective:   Albert Hardy. is a 67 y.o. male who presents for Medicare Annual/Subsequent preventive examination.  Visit Complete: Virtual  I connected with  Albert Hardy. on 11/12/22 by a audio enabled telemedicine application and verified that I am speaking with the correct person using two identifiers.  Patient Location: Home  Provider Location: Home Office  I discussed the limitations of evaluation and management by telemedicine. The patient expressed understanding and agreed to proceed.  Because this visit was a virtual/telehealth visit, some criteria may be missing or patient reported. Any vitals not documented were not able to be obtained and vitals that have been documented are patient reported.  .  Cardiac Risk Factors include: advanced age (>89men, >52 women);male gender;hypertension     Objective:    There were no vitals filed for this visit. There is no height or weight on file to calculate BMI.     11/12/2022    2:42 PM 11/10/2022    3:08 PM 08/25/2022    3:33 PM 08/17/2022    3:07 PM 06/17/2022    8:46 AM 06/07/2022    8:08 AM 05/26/2022    9:45 AM  Advanced Directives  Does Patient Have a Medical Advance Directive? No No No No No No No  Would patient like information on creating a medical advance directive? No - Patient declined No - Patient declined No - Patient declined No - Patient declined   Yes (MAU/Ambulatory/Procedural Areas - Information given)    Current Medications (verified) Outpatient Encounter Medications as of 11/12/2022  Medication Sig   albuterol (VENTOLIN HFA) 108 (90 Base) MCG/ACT inhaler Inhale 2 puffs into the lungs every 6 (six) hours as needed for wheezing or shortness of breath.   apixaban (ELIQUIS) 5 MG TABS tablet Take 1 tablet (5 mg total) by mouth 2 (two) times daily.   Ascorbic Acid (VITAMIN C PO) Take 1 tablet by mouth daily.   Cholecalciferol (VITAMIN D3) 1000 UNITS CAPS Take 1,000 Units by mouth daily.   Coenzyme Q10 (CO Q 10 PO)  Take 1 tablet by mouth daily.   Cyanocobalamin (VITAMIN B-12 PO) Take 1 tablet by mouth daily.   cyclobenzaprine (FLEXERIL) 5 MG tablet Take 1 tablet (5 mg total) by mouth 3 (three) times daily as needed for muscle spasms.   ezetimibe (ZETIA) 10 MG tablet Take 1 tablet (10 mg total) by mouth daily.   fluticasone (FLONASE) 50 MCG/ACT nasal spray Place 1-2 sprays into both nostrils daily.   KRILL OIL PO Take 1 tablet by mouth daily.   lisinopril (ZESTRIL) 10 MG tablet Take 1 tablet (10 mg total) by mouth daily.   MAGNESIUM PO Take 1 tablet by mouth daily.   metoCLOPramide (REGLAN) 10 MG tablet Take 1 tablet (10 mg total) by mouth 4 (four) times daily -  before meals and at bedtime.   morphine (MS CONTIN) 15 MG 12 hr tablet Take 1 tablet (15 mg total) by mouth every 12 (twelve) hours.   Multiple Vitamins-Minerals (CENTRUM SILVER PO) Take 1 tablet by mouth daily.   Multiple Vitamins-Minerals (ZINC PO) Take 1 tablet by mouth daily.   Naphazoline-Pheniramine (OPCON-A) 0.027-0.315 % SOLN Place 1 drop into both eyes daily as needed (redness).   nitroGLYCERIN (NITROSTAT) 0.4 MG SL tablet Place 1 tablet (0.4 mg total) under the tongue every 5 (five) minutes as needed for chest pain.   omeprazole (PRILOSEC) 20 MG capsule Take 1 capsule (20 mg total) by mouth daily.   oxyCODONE (OXY IR/ROXICODONE)  5 MG immediate release tablet Take 1 tablet (5 mg total) by mouth every 4 (four) hours as needed for severe pain.   prochlorperazine (COMPAZINE) 10 MG tablet Take 1 tablet (10 mg total) by mouth every 6 (six) hours as needed for nausea or vomiting.   rosuvastatin (CRESTOR) 5 MG tablet Take 1 tablet (5 mg total) by mouth at bedtime.   TURMERIC PO Take 1 tablet by mouth 3 (three) times a week.   umeclidinium-vilanterol (ANORO ELLIPTA) 62.5-25 MCG/ACT AEPB Inhale 1 puff into the lungs daily.   No facility-administered encounter medications on file as of 11/12/2022.    Allergies (verified) Lipitor [atorvastatin]  and Pravastatin   History: Past Medical History:  Diagnosis Date   Aortic regurgitation    Aortic stenosis 04/04/2018   Atherosclerosis of native arteries of the extremities with intermittent claudication 09/08/2013   Bilateral impacted cerumen 10/13/2018   Bilateral sensorineural hearing loss 12/14/2018   Dyspnea    Former moderate cigarette smoker (10-19 per day) 05/20/2012   Quit smoking Nov 2013 when hospitalized w/ HTN and chest pain(diagnosed w/ valvular heart disease)   GERD (gastroesophageal reflux disease)    Heart murmur    History of radiation therapy    Lumbar Spine, Thoracic Spine- 06/25/22-07/10/22- Dr. 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   Substance abuse [facility])    Past Surgical History:  Procedure Laterality Date   BRONCHIAL BIOPSY  05/26/2022   Procedure: BRONCHIAL BIOPSIES;  Surgeon: Josephine Igo, DO;  Location: [facility];  Service: Pulmonary;;   BRONCHIAL BRUSHINGS  05/26/2022   Procedure: BRONCHIAL BRUSHINGS;  Surgeon: Josephine Igo, DO;  Location: [facility];  Service: Pulmonary;;   NO PAST SURGERIES     VIDEO BRONCHOSCOPY  05/26/2022   Procedure: VIDEO BRONCHOSCOPY WITHOUT FLUORO;  Surgeon: Josephine Igo, DO;  Location: [facility];  Service: Pulmonary;;   Family History  Problem Relation Age of Onset   Other Brother    Hypertension Brother    Hyperlipidemia Brother    Arthritis Mother    Hypertension Mother    Hyperlipidemia Mother    Colon cancer Mother        13's   Hypertension Sister    Hyperlipidemia Sister    Diabetes Sister    Heart attack Father    Hyperlipidemia Brother    Social History   Socioeconomic History   Marital status: Married    Spouse name: Albert Hardy   Number of children: Not on file   Years of education: Not on file   Highest education level: 12th  grade  Occupational History   Occupation: Recruitment consultant  Tobacco Use   Smoking status: Former    Current packs/day: 0.00    Average packs/day: 1 pack/day for 30.0 years (30.0 ttl pk-yrs)    Types: Cigarettes    Start date: 12/20/1981    Quit date: 12/21/2011    Years since quitting: 10.9   Smokeless tobacco: Never  Vaping Use   Vaping status: Every Day  Substance and Sexual Activity   Alcohol use: Yes    Comment: Ocassionaly.    Drug use: Yes    Frequency: 1.0 times per week    Types: Marijuana   Sexual activity: Yes    Birth control/protection: None  Other Topics Concern   Not on file  Social History Narrative   Married. Education: [employer]. Exercise:  "Somewhat".   Social Determinants of Health   Financial Resource Strain: Low Risk  (11/12/2022)   Overall Financial Resource Strain (CARDIA)    Difficulty of Paying Living Expenses: Not hard at all  Recent Concern: Financial Resource Strain - Medium Risk (09/01/2022)   Overall Financial Resource Strain (CARDIA)    Difficulty of Paying Living Expenses: Somewhat hard  Food Insecurity: No Food Insecurity (11/12/2022)   Hunger Vital Sign    Worried About Running Out of Food in the Last Year: Never true    Ran Out of Food in the Last Year: Never true  Transportation Needs: No Transportation Needs (11/12/2022)   PRAPARE - Administrator, Civil Service (Medical): No    Lack of Transportation (Non-Medical): No  Physical Activity: Inactive (11/12/2022)   Exercise Vital Sign    Days of Exercise per Week: 0 days    Minutes of Exercise per Session: 0 min  Stress: No Stress Concern Present (11/12/2022)   [employer] of Occupational Health - Occupational Stress Questionnaire    Feeling of Stress : Not at all  Social Connections: Moderately Integrated (11/12/2022)   Social Connection and Isolation Panel [NHANES]    Frequency of Communication with Friends and Family: More than three times a week    Frequency of  Social Gatherings with Friends and Family: Once a week    Attends Religious Services: More than 4 times per year    Active Member of [employer] or Organizations: No    Attends Engineer, structural: Never    Marital Status: Married    Tobacco Counseling Counseling given: Not Answered   Clinical Intake:  Pre-visit preparation completed: Yes  Pain : No/denies pain     Diabetes: No  How often do you need to have someone help you when you read instructions, pamphlets, or other written materials from your doctor or pharmacy?: 1 - Never  Interpreter Needed?: No  Information entered by :: Remi Haggard LPN   Activities of Daily Living    11/12/2022    2:42 PM  In your present state of health, do you have any difficulty performing the following activities:  Hearing? 1  Vision? 0  Difficulty concentrating or making decisions? 0  Walking or climbing stairs? 0  Dressing or bathing? 0  Doing errands, shopping? 0  Preparing Food and eating ? N  Using the Toilet? N  In the past six months, have you accidently leaked urine? N  Do you have problems with loss of bowel control? N  Managing your Medications? N  Managing your Finances? N  Housekeeping or managing your Housekeeping? N    Patient Care Team: [name], MD as PCP - General (Family Medicine)  Indicate any recent Medical Services you may have received from other than [facility] providers in the past year (date may be approximate).     Assessment:   This is a routine wellness examination for Riley.  Hearing/Vision screen Hearing Screening - Comments:: No hearing aids  Hearing loss Vision Screening - Comments:: Up to date Vision Work   Goals Addressed             This Visit's Progress    Patient Stated       Be alive       Depression Screen    11/12/2022    2:44 PM 09/21/2022    1:08 PM 06/17/2022    8:54 AM 05/29/2022  9:03 AM 09/09/2021    1:59 PM 09/09/2021    1:58 PM 06/12/2021   11:51 AM  PHQ  2/9 Scores  PHQ - 2 Score 0 0 0 0 0 0 3  PHQ- 9 Score 2      9    Fall Risk    11/12/2022    2:39 PM 09/21/2022    1:12 PM 09/09/2021    1:59 PM 09/05/2021    3:48 PM 04/24/2020   11:14 AM  Fall Risk   Falls in the past year? 0 0 0 0 0  Number falls in past yr: 0  0 0 0  Injury with Fall? 0  0 0 0  Follow up Falls evaluation completed;Education provided;Falls prevention discussed  Falls evaluation completed;Education provided  Falls evaluation completed    MEDICARE RISK AT HOME: Medicare Risk at Home Any stairs in or around the home?: No If so, are there any without handrails?: No Home free of loose throw rugs in walkways, pet beds, electrical cords, etc?: Yes Adequate lighting in your home to reduce risk of falls?: Yes Life alert?: No Use of a cane, walker or w/c?: No Grab bars in the bathroom?: No Shower chair or bench in shower?: No Elevated toilet seat or a handicapped toilet?: No  TIMED UP AND GO:  Was the test performed?  No    Cognitive Function:        11/12/2022    2:46 PM 09/09/2021    2:01 PM  6CIT Screen  What Year? 0 points 0 points  What month? 0 points 0 points  What time? 0 points 0 points  Count back from 20 0 points 0 points  Months in reverse 0 points 0 points  Repeat phrase 0 points 0 points  Total Score 0 points 0 points    Immunizations Immunization History  Administered Date(s) Administered   Influenza, High Dose Seasonal PF 12/01/2021   Influenza,inj,Quad PF,6+ Mos 10/25/2019   Moderna Sars-Covid-2 Vaccination 06/14/2019, 07/19/2019   PFIZER(Purple Top)SARS-COV-2 Vaccination 04/09/2020, 07/25/2020   PNEUMOCOCCAL CONJUGATE-20 06/12/2021   Tdap 08/27/2014   Zoster Recombinant(Shingrix) 10/25/2019    TDAP status: Up to date  Flu Vaccine status: Due, Education has been provided regarding the importance of this vaccine. Advised may receive this vaccine at local pharmacy or Health Dept. Aware to provide a copy of the vaccination record if  obtained from local pharmacy or Health Dept. Verbalized acceptance and understanding.  Pneumococcal vaccine status: Up to date  Covid-19 vaccine status: Information provided on how to obtain vaccines.   Qualifies for Shingles Vaccine? Yes   Zostavax completed No   Shingrix Completed?: No.    Education has been provided regarding the importance of this vaccine. Patient has been advised to call insurance company to determine out of pocket expense if they have not yet received this vaccine. Advised may also receive vaccine at local pharmacy or Health Dept. Verbalized acceptance and understanding.  Screening Tests Health Maintenance  Topic Date Due   INFLUENZA VACCINE  11/20/2022 (Originally 09/10/2022)   COVID-19 Vaccine (5 - 2023-24 season) 11/28/2022 (Originally 10/11/2022)   Zoster Vaccines- Shingrix (2 of 2) 02/12/2023 (Originally 12/20/2019)   DTaP/Tdap/Td (2 - Td or Tdap) 08/26/2024   Colonoscopy  12/23/2024   Pneumonia Vaccine 71+ Years old  Completed   Hepatitis C Screening  Completed   HPV VACCINES  Aged Out   Lung Cancer Screening  Discontinued    Health Maintenance  There are no preventive care reminders  to display for this patient.   Colorectal cancer screening: Type of screening: Colonoscopy. Completed 2016. Repeat every 10 years  Lung Cancer Screening: (Low Dose CT Chest recommended if Age 63-80 years, 20 pack-year currently smoking OR have quit w/in 15years.) does qualify.   Lung Cancer Screening Referral:  Squamous cell carcinoma of lung, stage IV, left   Additional Screening:  Hepatitis C Screening: does not qualify; Completed 2014  Vision Screening: Recommended annual ophthalmology exams for early detection of glaucoma and other disorders of the eye. Is the patient up to date with their annual eye exam?  Yes  Who is the provider or what is the name of the office in which the patient attends annual eye exams? [facility] If pt is not established with a provider,  would they like to be referred to a provider to establish care? No .   Dental Screening: Recommended annual dental exams for proper oral hygiene    Community Resource Referral / Chronic Care Management: CRR required this visit?  No   CCM required this visit?  No     Plan:     I have personally reviewed and noted the following in the patient's chart:   Medical and social history Use of alcohol, tobacco or illicit drugs  Current medications and supplements including opioid prescriptions. Patient is not currently taking opioid prescriptions. Functional ability and status Nutritional status Physical activity Advanced directives List of other physicians Hospitalizations, surgeries, and ER visits in previous 12 months Vitals Screenings to include cognitive, depression, and falls Referrals and appointments  In addition, I have reviewed and discussed with patient certain preventive protocols, quality metrics, and best practice recommendations. A written personalized care plan for preventive services as well as general preventive health recommendations were provided to patient.     Remi Haggard, LPN   [id number]   After Visit Summary: (MyChart) Due to this being a telephonic visit, the after visit summary with patients personalized plan was offered to patient via MyChart   Nurse Notes:

## 2022-11-16 ENCOUNTER — Telehealth: Payer: Self-pay

## 2022-11-16 DIAGNOSIS — I340 Nonrheumatic mitral (valve) insufficiency: Secondary | ICD-10-CM

## 2022-11-16 NOTE — Telephone Encounter (Signed)
-----   Message from Nurse Ladonna Snide F sent at 11/12/2022  7:04 AM EDT -----  ----- Message ----- From: Baldo Daub, MD Sent: 11/11/2022   6:42 PM EDT To: Mickie Bail Ash/Hp Triage  He has moderate to severe regurgitation/leakage of his aortic valve fortunately his heart muscle remains normal we will keep a close eye on this and recheck 6 months please put him in recall

## 2022-11-30 NOTE — Progress Notes (Unsigned)
Palliative Medicine [facility]  [phone] [phone]   Name: Albert Hardy. Date: 11/30/2022 MRN: [id number]  DOB: 1956/01/11  Patient Care Team: [name], MD as PCP - General (Family Medicine)    INTERVAL HISTORY: Albert Hardy. is a 67 y.o. 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 Palliative ask to see for symptom management and goals of care   SOCIAL HISTORY:     reports that he quit smoking about 10 years ago. His smoking use included cigarettes. He started smoking about 40 years ago. He has a 30 pack-year smoking history. He has never used smokeless tobacco. He reports current alcohol use. He reports current drug use. Frequency: 1.00 time per week. Drug: Marijuana.  ADVANCE DIRECTIVES:  None on file  CODE STATUS: Full code  PAST MEDICAL HISTORY: Past Medical History:  Diagnosis Date   Aortic regurgitation    Aortic stenosis 04/04/2018   Atherosclerosis of native arteries of the extremities with intermittent claudication 09/08/2013   Bilateral impacted cerumen 10/13/2018   Bilateral sensorineural hearing loss 12/14/2018   Dyspnea    Former moderate cigarette smoker (10-19 per day) 05/20/2012   Quit smoking Nov 2013 when hospitalized w/ HTN and chest pain(diagnosed w/ valvular heart disease)   GERD (gastroesophageal reflux disease)    Heart murmur    History of radiation therapy    Lumbar Spine, Thoracic Spine- 06/25/22-07/10/22- Dr. 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  MEDICATIONS:  Current Outpatient Medications  Medication Sig Dispense Refill   albuterol (VENTOLIN HFA) 108 (90 Base)  MCG/ACT inhaler Inhale 2 puffs into the lungs every 6 (six) hours as needed for wheezing or shortness of breath. 8 g 6   apixaban (ELIQUIS) 5 MG TABS tablet Take 1 tablet (5 mg total) by mouth 2 (two) times daily. 60 tablet 2   Ascorbic Acid (VITAMIN C PO) Take 1 tablet by mouth daily.     Cholecalciferol (VITAMIN D3) 1000 UNITS CAPS Take 1,000 Units by mouth daily.     Coenzyme Q10 (CO Q 10 PO) Take 1 tablet by mouth daily.     Cyanocobalamin (VITAMIN B-12 PO) Take 1 tablet by mouth daily.     cyclobenzaprine (FLEXERIL) 5 MG tablet Take 1 tablet (5 mg total) by mouth 3 (three) times daily as needed for muscle spasms. 30 tablet 0   ezetimibe (ZETIA) 10 MG tablet Take 1 tablet (10 mg total) by mouth daily. 90 tablet 3   fluticasone (FLONASE) 50 MCG/ACT nasal spray Place 1-2 sprays into both nostrils daily. 16 g 6   KRILL OIL PO Take 1 tablet by mouth daily.     lisinopril (ZESTRIL) 10 MG tablet Take 1 tablet (10 mg total) by mouth daily. 30 tablet 1   MAGNESIUM PO Take 1 tablet by mouth daily.     metoCLOPramide (REGLAN) 10 MG tablet Take 1 tablet (10 mg total) by mouth 4 (four) times daily -  before meals and at bedtime. 56 tablet 0   morphine (MS CONTIN) 15 MG 12 hr tablet Take 1 tablet (15 mg total) by mouth every 12 (twelve) hours. 60 tablet 0  Multiple Vitamins-Minerals (CENTRUM SILVER PO) Take 1 tablet by mouth daily.     Multiple Vitamins-Minerals (ZINC PO) Take 1 tablet by mouth daily.     Naphazoline-Pheniramine (OPCON-A) 0.027-0.315 % SOLN Place 1 drop into both eyes daily as needed (redness).     nitroGLYCERIN (NITROSTAT) 0.4 MG SL tablet Place 1 tablet (0.4 mg total) under the tongue every 5 (five) minutes as needed for chest pain. 25 tablet 1   omeprazole (PRILOSEC) 20 MG capsule Take 1 capsule (20 mg total) by mouth daily. 90 capsule 1   oxyCODONE (OXY IR/ROXICODONE) 5 MG immediate release tablet Take 1 tablet (5 mg total) by mouth every 4 (four) hours as needed for severe pain. 90  tablet 0   prochlorperazine (COMPAZINE) 10 MG tablet Take 1 tablet (10 mg total) by mouth every 6 (six) hours as needed for nausea or vomiting. 30 tablet 0   rosuvastatin (CRESTOR) 5 MG tablet Take 1 tablet (5 mg total) by mouth at bedtime. 90 tablet 1   TURMERIC PO Take 1 tablet by mouth 3 (three) times a week.     umeclidinium-vilanterol (ANORO ELLIPTA) 62.5-25 MCG/ACT AEPB Inhale 1 puff into the lungs daily. 180 each 3   No current facility-administered medications for this visit.    VITAL SIGNS: There were no vitals taken for this visit. There were no vitals filed for this visit.  Estimated body mass index is 20.78 kg/m as calculated from the following:   Height as of 11/10/22: 5\' 9"  (1.753 m).   Weight as of 11/10/22: 140 lb 11.2 oz (63.8 kg).     Latest Ref Rng & Units 11/10/2022    1:10 PM 10/20/2022    9:16 AM 09/29/2022    9:25 AM  CBC  WBC 4.0 - 10.5 K/uL 3.1  3.3  3.7   Hemoglobin 13.0 - 17.0 g/dL 57.8  46.9  62.9   Hematocrit 39.0 - 52.0 % 31.6  32.7  31.1   Platelets 150 - 400 K/uL 230  259  176        Latest Ref Rng & Units 11/10/2022    1:10 PM 10/20/2022    9:16 AM 10/01/2022   12:36 PM  CMP  Glucose 70 - 99 mg/dL 94  528  413   BUN 8 - 23 mg/dL 10  14  24    Creatinine 0.61 - 1.24 mg/dL 2.44  0.10  2.72   Sodium 135 - 145 mmol/L 142  141  136   Potassium 3.5 - 5.1 mmol/L 3.5  3.8  3.9   Chloride 98 - 111 mmol/L 110  107  99   CO2 22 - 32 mmol/L 22  24  28    Calcium 8.9 - 10.3 mg/dL 8.8  9.3  9.3   Total Protein 6.5 - 8.1 g/dL 7.0  8.0    Total Bilirubin 0.3 - 1.2 mg/dL 0.3  0.4    Alkaline Phos 38 - 126 U/L 52  56    AST 15 - 41 U/L 15  15    ALT 0 - 44 U/L 12  12      PERFORMANCE STATUS (ECOG) : 1 - Symptomatic but completely ambulatory   Physical Exam General: NAD Cardiovascular: RRR Pulmonary: normal breathing pattern  Extremities: no edema, no joint deformities Skin: no rashes Neurological: AAO x4  IMPRESSION: I saw Albert Hardy during his  infusion. No acute distress. No family present. States he is doing well overall. Denies nausea, vomiting, constipation, or diarrhea. Is  remaining as active as possible. Appreciative of increase in appetite.   Neoplasm related pain Albert Hardy reports his pain is well-controlled on current regimen.  Some days are better than others.  Occasional back spasms however are manageable.    We discussed his regimen at length. He is currently taking MS Contin 15 mg every 12 hours, Oxy IR 5mg  every 4-6  hours as needed for breakthrough pain.  Flexeril as needed for spasms.    No changes to current regimen at this time.  Will continue to closely monitor.   Constipation Much improved with daily regimen to include Senna-S 2 tabs twice daily.   3. Decreased appetite/Weight loss  Appetite slowly improving. Current weight 140lbs up from 135 lbs.  4. Goals of Care   06/18/22-  We discussed his current illness and what it means in the larger context of hsi on-going co-morbidities. Natural disease trajectory and expectations were discussed.  Albert Hardy and his wife are realistic in their expectations and understanding of his incurable cancer. He knows all treatment is palliative focused. Wishes to continue taking things one day at a time focusing on his quality of life allowing him every opportunity to continue to thrive.   We discussed Her current illness and what it means in the larger context of Her on-going co-morbidities. Natural disease trajectory and expectations were discussed.  I discussed the importance of continued conversation with family and their medical providers regarding overall plan of care and treatment options, ensuring decisions are within the context of the patients values and GOCs.  PLAN:  Oxy IR 5mg  every 4-6 hours as needed for breakthrough pain  MS Contin 15 mg every 12 hours.  Tolerating well. Flexeril as needed Senna 2 tabs twice daily  Miralax daily  Pain contract on file I will  plan to see patient back in 3-4 weeks in collaboration to other oncology appointments.     Patient expressed understanding and was in agreement with this plan. He also understands that He can call the clinic at any time with any questions, concerns, or complaints.   Any controlled substances utilized were prescribed in the context of palliative care. PDMP has been reviewed.    Visit consisted of counseling and education dealing with the complex and emotionally intense issues of symptom management and palliative care in the setting of serious and potentially life-threatening illness.Greater than 50%  of this time was spent counseling and coordinating care related to the above assessment and plan.  Willette Alma, AGPCNP-BC  Palliative Medicine Team/[facility]  *Please note that this is a verbal dictation therefore any spelling or grammatical errors are due to the "[employer]" system interpretation.

## 2022-12-01 ENCOUNTER — Encounter: Payer: Self-pay | Admitting: Nurse Practitioner

## 2022-12-01 ENCOUNTER — Inpatient Hospital Stay (HOSPITAL_BASED_OUTPATIENT_CLINIC_OR_DEPARTMENT_OTHER): Payer: Medicare Other | Admitting: Nurse Practitioner

## 2022-12-01 ENCOUNTER — Inpatient Hospital Stay: Payer: Medicare Other

## 2022-12-01 ENCOUNTER — Inpatient Hospital Stay (HOSPITAL_BASED_OUTPATIENT_CLINIC_OR_DEPARTMENT_OTHER): Payer: Medicare Other | Admitting: Internal Medicine

## 2022-12-01 VITALS — BP 184/63 | HR 63 | Resp 16

## 2022-12-01 DIAGNOSIS — C3492 Malignant neoplasm of unspecified part of left bronchus or lung: Secondary | ICD-10-CM | POA: Diagnosis not present

## 2022-12-01 DIAGNOSIS — G893 Neoplasm related pain (acute) (chronic): Secondary | ICD-10-CM

## 2022-12-01 DIAGNOSIS — Z5112 Encounter for antineoplastic immunotherapy: Secondary | ICD-10-CM | POA: Diagnosis not present

## 2022-12-01 DIAGNOSIS — R530 Neoplastic (malignant) related fatigue: Secondary | ICD-10-CM | POA: Diagnosis not present

## 2022-12-01 DIAGNOSIS — Z515 Encounter for palliative care: Secondary | ICD-10-CM

## 2022-12-01 LAB — CMP (CANCER CENTER ONLY)
ALT: 22 U/L (ref 0–44)
AST: 19 U/L (ref 15–41)
Albumin: 3.9 g/dL (ref 3.5–5.0)
Alkaline Phosphatase: 51 U/L (ref 38–126)
Anion gap: 7 (ref 5–15)
BUN: 13 mg/dL (ref 8–23)
CO2: 26 mmol/L (ref 22–32)
Calcium: 8.7 mg/dL — ABNORMAL LOW (ref 8.9–10.3)
Chloride: 110 mmol/L (ref 98–111)
Creatinine: 0.95 mg/dL (ref 0.61–1.24)
GFR, Estimated: >60 mL/min (ref >60)
Glucose, Bld: 115 mg/dL — ABNORMAL HIGH (ref 70–99)
Potassium: 3.8 mmol/L (ref 3.5–5.1)
Sodium: 143 mmol/L (ref 135–145)
Total Bilirubin: 0.2 mg/dL — ABNORMAL LOW (ref 0.3–1.2)
Total Protein: 7 g/dL (ref 6.5–8.1)

## 2022-12-01 LAB — CBC WITH DIFFERENTIAL (CANCER CENTER ONLY)
Abs Immature Granulocytes: 0 10*3/uL (ref 0.00–0.07)
Basophils Absolute: 0 10*3/uL (ref 0.0–0.1)
Basophils Relative: 0 %
Eosinophils Absolute: 0 10*3/uL (ref 0.0–0.5)
Eosinophils Relative: 1 %
HCT: 33.7 % — ABNORMAL LOW (ref 39.0–52.0)
Hemoglobin: 10.8 g/dL — ABNORMAL LOW (ref 13.0–17.0)
Immature Granulocytes: 0 %
Lymphocytes Relative: 22 %
Lymphs Abs: 0.6 10*3/uL — ABNORMAL LOW (ref 0.7–4.0)
MCH: 30.5 pg (ref 26.0–34.0)
MCHC: 32 g/dL (ref 30.0–36.0)
MCV: 95.2 fL (ref 80.0–100.0)
Monocytes Absolute: 0.3 10*3/uL (ref 0.1–1.0)
Monocytes Relative: 10 %
Neutro Abs: 2 10*3/uL (ref 1.7–7.7)
Neutrophils Relative %: 67 %
Platelet Count: 193 10*3/uL (ref 150–400)
RBC: 3.54 MIL/uL — ABNORMAL LOW (ref 4.22–5.81)
RDW: 13.5 % (ref 11.5–15.5)
WBC Count: 3 10*3/uL — ABNORMAL LOW (ref 4.0–10.5)
nRBC: 0 % (ref 0.0–0.2)

## 2022-12-01 MED ORDER — OXYCODONE HCL 5 MG PO TABS: 5.0000 mg | ORAL_TABLET | ORAL | 0 refills | Status: DC | PRN

## 2022-12-01 MED ORDER — SODIUM CHLORIDE 0.9 % IV SOLN: Freq: Once | INTRAVENOUS | Status: AC

## 2022-12-01 MED ORDER — MORPHINE SULFATE ER 15 MG PO TBCR: 15.0000 mg | EXTENDED_RELEASE_TABLET | Freq: Two times a day (BID) | ORAL | 0 refills | Status: DC

## 2022-12-01 MED ORDER — SODIUM CHLORIDE 0.9 % IV SOLN
350.0000 mg | Freq: Once | INTRAVENOUS | Status: AC
  Administered 2022-12-01: 350 mg via INTRAVENOUS
  Filled 2022-12-01: qty 7

## 2022-12-01 NOTE — Patient Instructions (Signed)
[facility]  Discharge Instructions: Thank you for choosing [facility] to provide your oncology and hematology care.   If you have a lab appointment with the Cancer Center, please go directly to the Cancer Center and check in at the registration area.   Wear comfortable clothing and clothing appropriate for easy access to any Portacath or PICC line.   We strive to give you quality time with your provider. You may need to reschedule your appointment if you arrive late (15 or more minutes).  Arriving late affects you and other patients whose appointments are after yours.  Also, if you miss three or more appointments without notifying the office, you may be dismissed from the clinic at the provider's discretion.      For prescription refill requests, have your pharmacy contact our office and allow 72 hours for refills to be completed.    Today you received the following chemotherapy and/or immunotherapy agents: Libtayo.       To help prevent nausea and vomiting after your treatment, we encourage you to take your nausea medication as directed.  BELOW ARE SYMPTOMS THAT SHOULD BE REPORTED IMMEDIATELY: *FEVER GREATER THAN 100.4 F (38 C) OR HIGHER *CHILLS OR SWEATING *NAUSEA AND VOMITING THAT IS NOT CONTROLLED WITH YOUR NAUSEA MEDICATION *UNUSUAL SHORTNESS OF BREATH *UNUSUAL BRUISING OR BLEEDING *URINARY PROBLEMS (pain or burning when urinating, or frequent urination) *BOWEL PROBLEMS (unusual diarrhea, constipation, pain near the anus) TENDERNESS IN MOUTH AND THROAT WITH OR WITHOUT PRESENCE OF ULCERS (sore throat, sores in mouth, or a toothache) UNUSUAL RASH, SWELLING OR PAIN  UNUSUAL VAGINAL DISCHARGE OR ITCHING   Items with * indicate a potential emergency and should be followed up as soon as possible or go to the Emergency Department if any problems should occur.  Please show the CHEMOTHERAPY ALERT CARD or IMMUNOTHERAPY ALERT CARD at  check-in to the Emergency Department and triage nurse.  Should you have questions after your visit or need to cancel or reschedule your appointment, please contact [facility]  Dept: [phone]  and follow the prompts.  Office hours are 8:00 a.m. to 4:30 p.m. Monday - Friday. Please note that voicemails left after 4:00 p.m. may not be returned until the following business day.  We are closed weekends and major holidays. You have access to a nurse at all times for urgent questions. Please call the main number to the clinic Dept: [phone] and follow the prompts.   For any non-urgent questions, you may also contact your provider using MyChart. We now offer e-Visits for anyone 18 and older to request care online for non-urgent symptoms. For details visit [email or web address].   Also download the MyChart app! Go to the app store, search "MyChart", open the app, select [place], and log in with your MyChart username and password.   

## 2022-12-01 NOTE — Progress Notes (Signed)
[facility] [phone]   [phone]  OFFICE PROGRESS NOTE  [name], MD [address] [place] [address]  DIAGNOSIS: Stage IV (T2a, N2, M1 B) non-small cell lung cancer, squamous cell carcinoma presented with left upper lobe lung mass in addition to AP window lymphadenopathy and suspicious bone metastasis to the T8 and L4 vertebrae in addition to retroperitoneal metastatic soft tissue nodule diagnosed in April 2024.   Molecular studies by [id number] showed no actionable mutations and PD-L1 expression of 70%.  PRIOR THERAPY:  1) Palliative radiotherapy to the T8 and L4 metastatic bone disease. 2) ystemic chemotherapy with carboplatin for AUC of 5, paclitaxel 175 Mg/M2 and Libtayo (Cempilimab) 350 Mg IV every 3 weeks with Neulasta support for 4 cycles    CURRENT THERAPY: Maintenance treatment with single agent Libtayo (Cempilimab) 350 Mg IV every 3 weeks.  First dose September 08, 2022.  Status post 3 cycles.  INTERVAL HISTORY: Albert Hardy. 67 y.o. male returns to the clinic today for follow-up visit.Discussed the use of AI scribe software for clinical note transcription with the patient, who gave verbal consent to proceed.  History of Present Illness   The patient, Albert Hardy, a 67 year old with a diagnosis of stage four non-small cell lung cancer (squamous cell carcinoma) in April 2024, has been undergoing treatment with Libtayo. He has completed four cycles of chemotherapy and immunotherapy, and is currently on Libtayo monotherapy, having received four cycles so far.  Recently, the patient has been experiencing spikes in blood pressure, with readings reaching as high as 200 systolic. Typically, his blood pressure at home ranges around 140 systolic. He is currently on Lisinopril for blood pressure management, which was previously combined with a diuretic (HCTZ) but has since been adjusted to Lisinopril alone.  The patient also  reports mild anemia, as indicated by recent blood work. No other significant changes in health or symptoms have been reported since the last consultation three weeks prior.       MEDICAL HISTORY: Past Medical History:  Diagnosis Date   Aortic regurgitation    Aortic stenosis 04/04/2018   Atherosclerosis of native arteries of the extremities with intermittent claudication 09/08/2013   Bilateral impacted cerumen 10/13/2018   Bilateral sensorineural hearing loss 12/14/2018   Dyspnea    Former moderate cigarette smoker (10-19 per day) 05/20/2012   Quit smoking Nov 2013 when hospitalized w/ HTN and chest pain(diagnosed w/ valvular heart disease)   GERD (gastroesophageal reflux disease)    Heart murmur    History of radiation therapy    Lumbar Spine, Thoracic Spine- 06/25/22-07/10/22- Dr. 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  MEDICATIONS:  Current Outpatient Medications  Medication Sig Dispense Refill   albuterol (VENTOLIN HFA) 108 (90 Base) MCG/ACT inhaler Inhale 2 puffs into the lungs every 6 (six) hours as needed for wheezing or shortness of breath. 8 g 6   apixaban (ELIQUIS) 5 MG TABS tablet Take 1 tablet (5 mg total) by mouth 2 (two) times daily. 60 tablet 2   Ascorbic Acid (VITAMIN C PO) Take 1 tablet by mouth daily.     Cholecalciferol (VITAMIN D3) 1000 UNITS CAPS Take 1,000  Units by mouth daily.     Coenzyme Q10 (CO Q 10 PO) Take 1 tablet by mouth daily.     Cyanocobalamin (VITAMIN B-12 PO) Take 1 tablet by mouth daily.     cyclobenzaprine (FLEXERIL) 5 MG tablet Take 1 tablet (5 mg total) by mouth 3 (three) times daily as needed for muscle spasms. 30 tablet 0   ezetimibe (ZETIA) 10 MG tablet Take 1 tablet (10 mg total) by mouth  daily. 90 tablet 3   fluticasone (FLONASE) 50 MCG/ACT nasal spray Place 1-2 sprays into both nostrils daily. 16 g 6   KRILL OIL PO Take 1 tablet by mouth daily.     lisinopril (ZESTRIL) 10 MG tablet Take 1 tablet (10 mg total) by mouth daily. 30 tablet 1   MAGNESIUM PO Take 1 tablet by mouth daily.     metoCLOPramide (REGLAN) 10 MG tablet Take 1 tablet (10 mg total) by mouth 4 (four) times daily -  before meals and at bedtime. 56 tablet 0   morphine (MS CONTIN) 15 MG 12 hr tablet Take 1 tablet (15 mg total) by mouth every 12 (twelve) hours. 60 tablet 0   Multiple Vitamins-Minerals (CENTRUM SILVER PO) Take 1 tablet by mouth daily.     Multiple Vitamins-Minerals (ZINC PO) Take 1 tablet by mouth daily.     Naphazoline-Pheniramine (OPCON-A) 0.027-0.315 % SOLN Place 1 drop into both eyes daily as needed (redness).     nitroGLYCERIN (NITROSTAT) 0.4 MG SL tablet Place 1 tablet (0.4 mg total) under the tongue every 5 (five) minutes as needed for chest pain. 25 tablet 1   omeprazole (PRILOSEC) 20 MG capsule Take 1 capsule (20 mg total) by mouth daily. 90 capsule 1   oxyCODONE (OXY IR/ROXICODONE) 5 MG immediate release tablet Take 1 tablet (5 mg total) by mouth every 4 (four) hours as needed for severe pain. 90 tablet 0   prochlorperazine (COMPAZINE) 10 MG tablet Take 1 tablet (10 mg total) by mouth every 6 (six) hours as needed for nausea or vomiting. 30 tablet 0   rosuvastatin (CRESTOR) 5 MG tablet Take 1 tablet (5 mg total) by mouth at bedtime. 90 tablet 1   TURMERIC PO Take 1 tablet by mouth 3 (three) times a week.     umeclidinium-vilanterol (ANORO ELLIPTA) 62.5-25 MCG/ACT AEPB Inhale 1 puff into the lungs daily. 180 each 3   No current facility-administered medications for this visit.    SURGICAL HISTORY:  Past Surgical History:  Procedure Laterality Date   BRONCHIAL BIOPSY  05/26/2022   Procedure: BRONCHIAL BIOPSIES;  Surgeon: Josephine Igo, DO;  Location: [facility];  Service: Pulmonary;;    BRONCHIAL BRUSHINGS  05/26/2022   Procedure: BRONCHIAL BRUSHINGS;  Surgeon: Josephine Igo, DO;  Location: [facility];  Service: Pulmonary;;   NO PAST SURGERIES     VIDEO BRONCHOSCOPY  05/26/2022   Procedure: VIDEO BRONCHOSCOPY WITHOUT FLUORO;  Surgeon: Josephine Igo, DO;  Location: [facility];  Service: Pulmonary;;    REVIEW OF SYSTEMS:  Constitutional: positive for fatigue Eyes: negative Ears, nose, mouth, throat, and face: negative Respiratory: positive for dyspnea on exertion Cardiovascular: negative Gastrointestinal: negative Genitourinary:negative Integument/breast: negative Hematologic/lymphatic: negative Musculoskeletal:positive for arthralgias Neurological: negative Behavioral/Psych: negative Endocrine: negative Allergic/Immunologic: negative   PHYSICAL EXAMINATION: General appearance: alert, cooperative, and no distress Head: Normocephalic, without obvious abnormality, atraumatic Neck: no adenopathy, no JVD, supple, symmetrical, trachea midline, and thyroid not enlarged, symmetric, no tenderness/mass/nodules Lymph nodes: Cervical, supraclavicular, and axillary nodes normal. Resp: clear  to auscultation bilaterally Back: symmetric, no curvature. ROM normal. No CVA tenderness. Cardio: regular rate and rhythm, S1, S2 normal, no murmur, click, rub or gallop GI: soft, non-tender; bowel sounds normal; no masses,  no organomegaly Extremities: extremities normal, atraumatic, no cyanosis or edema Neurologic: Alert and oriented X 3, normal strength and tone. Normal symmetric reflexes. Normal coordination and gait  ECOG PERFORMANCE STATUS: 1 - Symptomatic but completely ambulatory  Blood pressure 120/78, pulse 70, temperature 98.2 F (36.8 C), temperature source Oral, resp. rate 17, height 5\' 9"  (1.753 m), weight 140 lb 7 oz (63.7 kg), SpO2 100%.  LABORATORY DATA: Lab Results  Component Value Date   WBC 3.0 (L) 12/01/2022   HGB 10.8 (L) 12/01/2022   HCT 33.7 (L)  12/01/2022   MCV 95.2 12/01/2022   PLT 193 12/01/2022      Chemistry      Component Value Date/Time   NA 143 12/01/2022 1111   NA 145 (H) 10/21/2021 1038   K 3.8 12/01/2022 1111   CL 110 12/01/2022 1111   CO2 26 12/01/2022 1111   BUN 13 12/01/2022 1111   BUN 19 10/21/2021 1038   CREATININE 0.95 12/01/2022 1111   CREATININE 1.07 08/27/2014 1511      Component Value Date/Time   CALCIUM 8.7 (L) 12/01/2022 1111   ALKPHOS 51 12/01/2022 1111   AST 19 12/01/2022 1111   ALT 22 12/01/2022 1111   BILITOT 0.2 (L) 12/01/2022 1111       RADIOGRAPHIC STUDIES: ECHOCARDIOGRAM COMPLETE  Result Date: 11/11/2022    ECHOCARDIOGRAM REPORT   Patient Name:   Malvern Bober. Date of Exam: 11/11/2022 Medical Rec #:  [id number]       Height:       69.0 in Accession #:    [id number]      Weight:       140.7 lb Date of Birth:  September 11, 1955       BSA:          1.779 m Patient Age:    67 years        BP:           153/63 mmHg Patient Gender: M               HR:           63 bpm. Exam Location:  [facility] Procedure: 2D Echo, Cardiac Doppler, Color Doppler and Strain Analysis Indications:    Angina pectoris (HCC) [I20.9 (ICD-10-CM)]; Nonrheumatic aortic                 valve stenosis [I35.0 (ICD-10-CM)]; Primary hypertension [I10                 (ICD-10-CM)]  History:        Patient has prior history of Echocardiogram examinations, most                 recent 09/24/2021. Aortic Valve Disease; Risk                 Factors:Hypertension.  Sonographer:    Margreta Journey RDCS Referring Phys: [id number] BRIAN J MUNLEY IMPRESSIONS  1. Left ventricular ejection fraction, by estimation, is 60 to 65%. The left ventricle has normal function. The left ventricle has no regional wall motion abnormalities. There is mild concentric left ventricular hypertrophy. Left ventricular diastolic parameters are indeterminate. The average left ventricular global longitudinal strain is -21.0 %. The global longitudinal strain is normal.  2. Right  ventricular systolic  function is normal. The right ventricular size is normal. Tricuspid regurgitation signal is inadequate for assessing PA pressure.  3. Moderate to severe aortic insufficiency with eccentric jet. Doppler suboptial to assess diastolic flow reversal in descending thoracic aorta. Appears secondary to thickened aortic valve cusps. No stenosis.  4. There is borderline dilatation of the aortic root, measuring 38 mm.  5. The mitral valve is grossly normal. Mild mitral valve regurgitation. No evidence of mitral stenosis.  6. The inferior vena cava is normal in size with <50% respiratory variability, suggesting right atrial pressure of 8 mmHg.  7. A small pericardial effusion is present. The pericardial effusion is anterior to the right ventricle and localized near the right atrium. There is no evidence of cardiac tamponade. Comparison(s): A prior study was performed on 09/24/2021. AS was reported, with LVOT vax 1.77m/s and AV Vmax 2.65m/s and AVA 2.53cm2. AI was reported mild. There was no pericardial effusion. Conclusion(s)/Recommendation(s): Consider TEE to further assess AV structure and severity of AI if clinically indicated. Alternately follow up closely for interval change given the new pericardial effusion observed. FINDINGS  Left Ventricle: Left ventricular ejection fraction, by estimation, is 60 to 65%. The left ventricle has normal function. The left ventricle has no regional wall motion abnormalities. The average left ventricular global longitudinal strain is -21.0 %. The global longitudinal strain is normal. The left ventricular internal cavity size was normal in size. There is mild concentric left ventricular hypertrophy. Left ventricular diastolic parameters are indeterminate. Right Ventricle: The right ventricular size is normal. Right vetricular wall thickness was not well visualized. Right ventricular systolic function is normal. Tricuspid regurgitation signal is inadequate for assessing  PA pressure. Left Atrium: Volume index 11ml/m2. Left atrial size was normal in size. Right Atrium: Right atrial size was normal in size. Pericardium: A small pericardial effusion is present. The pericardial effusion is anterior to the right ventricle and localized near the right atrium. There is no evidence of cardiac tamponade. Mitral Valve: The mitral valve is grossly normal. Mild mitral valve regurgitation. No evidence of mitral valve stenosis. Tricuspid Valve: The tricuspid valve is not well visualized. Tricuspid valve regurgitation is trivial. Aortic Valve: Moderately thickened leaflets, with focal nodular appearance on noncoronary and left coronary cusp. The aortic valve is tricuspid. Aortic valve regurgitation is moderate to severe, eccentric. Aortic regurgitation PHT measures 408 msec. Aortic valve sclerosis/calcification is present, without any evidence of aortic stenosis. Aortic valve mean gradient measures 8.8 mmHg. Aortic valve peak gradient measures 16.4 mmHg. Aortic valve area, by VTI measures 2.90 cm. Pulmonic Valve: The pulmonic valve was not well visualized. Pulmonic valve regurgitation is not visualized. Aorta: The ascending aorta is structurally normal, with no evidence of dilitation. There is borderline dilatation of the aortic root, measuring 38 mm. Venous: The inferior vena cava is normal in size with less than 50% respiratory variability, suggesting right atrial pressure of 8 mmHg. IAS/Shunts: The interatrial septum was not well visualized.  LEFT VENTRICLE PLAX 2D LVIDd:         5.10 cm   Diastology LVIDs:         3.50 cm   LV e' medial:    6.85 cm/s LV PW:         1.20 cm   LV E/e' medial:  16.9 LV IVS:        1.20 cm   LV e' lateral:   6.53 cm/s LVOT diam:     2.10 cm   LV E/e' lateral: 17.7 LV SV:  140 LV SV Index:   79        2D Longitudinal Strain LVOT Area:     3.46 cm  2D Strain GLS Avg:     -21.0 %  RIGHT VENTRICLE             IVC RV Basal diam:  3.00 cm     IVC diam: 2.00 cm  RV Mid diam:    2.80 cm RV S prime:     12.30 cm/s TAPSE (M-mode): 2.5 cm LEFT ATRIUM             Index        RIGHT ATRIUM           Index LA diam:        4.00 cm 2.25 cm/m   RA Area:     15.20 cm LA Vol (A2C):   54.9 ml 30.86 ml/m  RA Volume:   37.30 ml  20.96 ml/m LA Vol (A4C):   42.8 ml 24.06 ml/m LA Biplane Vol: 52.0 ml 29.23 ml/m  AORTIC VALVE AV Area (Vmax):    3.17 cm AV Area (Vmean):   2.74 cm AV Area (VTI):     2.90 cm AV Vmax:           202.50 cm/s AV Vmean:          138.000 cm/s AV VTI:            0.483 m AV Peak Grad:      16.4 mmHg AV Mean Grad:      8.8 mmHg LVOT Vmax:         185.50 cm/s LVOT Vmean:        109.000 cm/s LVOT VTI:          0.405 m LVOT/AV VTI ratio: 0.84 AI PHT:            408 msec  AORTA Ao Root diam: 3.80 cm Ao Asc diam:  3.30 cm Ao Desc diam: 2.50 cm MITRAL VALVE MV Area (PHT): 3.95 cm     SHUNTS MV Decel Time: 192 msec     Systemic VTI:  0.40 m MR Peak grad: 97.6 mmHg     Systemic Diam: 2.10 cm MR Vmax:      494.00 cm/s MV E velocity: 115.50 cm/s MV A velocity: 135.50 cm/s MV E/A ratio:  0.85 Sreedhar reddy Madireddy Electronically signed by Vern Claude reddy Madireddy Signature Date/Time: 11/11/2022/12:30:43 PM    Final    CT CHEST ABDOMEN PELVIS W CONTRAST  Result Date: 11/10/2022 CLINICAL DATA:  Non-small cell lung cancer restaging * Tracking Code: BO * EXAM: CT CHEST, ABDOMEN, AND PELVIS WITH CONTRAST TECHNIQUE: Multidetector CT imaging of the chest, abdomen and pelvis was performed following the standard protocol during bolus administration of intravenous contrast. RADIATION DOSE REDUCTION: This exam was performed according to the departmental dose-optimization program which includes automated exposure control, adjustment of the mA and/or kV according to patient size and/or use of iterative reconstruction technique. CONTRAST:  OMNIPAQUE IOHEXOL 300 MG/ML  SOLN COMPARISON:  08/06/2022 FINDINGS: CT CHEST FINDINGS Cardiovascular: Atherosclerotic calcification of  the aortic arch. Faint calcification of the aortic valve. Trace pericardial effusion. Mediastinum/Nodes: Right lower paratracheal node 0.8 cm in short axis on image 26 series 2, formerly 0.4 cm. Lungs/Pleura: Paramediastinal stranding in volume loss probably related to prior palliative radiotherapy to the T8 metastatic lesion. At the site of the original left hilar mass, there is some increased soft tissue thickening up to 1.3  cm in diameter on image 31 of series 2, previously 0.7 cm on 08/06/2022, raising some concern for worsening. Reocclusion of the apicoposterior segmental bronchus in the left upper lobe. New ground-glass density nodule in the superior segment left lower lobe measures 0.8 by 0.6 cm on image 90 of series 4. Given that this was not present 3 months ago and is most likely due to local alveolitis. Surveillance suggested. The small nodule in the posterior basal segment right lower lobe along the costophrenic angle has resolved. New small localized left pleural effusion medially on image 43 series 2. Musculoskeletal: Increased sclerosis within along the left T8 metastatic lesion with reduced soft tissue extraosseous component suggesting response to therapy. No new bony lesions in the thorax identified. CT ABDOMEN PELVIS FINDINGS Hepatobiliary: Mildly contracted gallbladder. Otherwise unremarkable. Pancreas: Unremarkable Spleen: Unremarkable Adrenals/Urinary Tract: Hyperdense nonspecific 0.8 cm exophytic lesion of the left kidney lower pole could be a complex cyst or small mass. Otherwise unremarkable. Stomach/Bowel: Unremarkable Vascular/Lymphatic: Atherosclerosis is present, including aortoiliac atherosclerotic disease. Reproductive: Prostatomegaly. Other: Small amount of free pelvic fluid. Musculoskeletal: Mildly increased sclerosis along the margins of the right L4 lytic lesion. The margins of this lesion are difficult to assess due to the peripherally tapering density of the sclerosis, but the  lucent portion of the lesion measures 2.4 by 1.9 cm, formerly 2.6 by 2.0 cm. Degenerative spurring in both hips. Lumbar spondylosis and degenerative disc disease causing multilevel lumbar impingement. IMPRESSION: 1. Increased soft tissue thickening at the site of the original left hilar mass, with reocclusion of the apicoposterior segmental bronchus of the left upper lobe, raising some concern for local worsening. 2. New ground-glass density nodule in the superior segment left lower lobe measures 0.8 by 0.6 cm. Given that this was not present 3 months ago and is most likely due to local alveolitis. Surveillance suggested. 3. New small localized left pleural effusion medially. 4. Increased sclerosis within and along the left T8 and L4 metastatic lesions. Reduced soft tissue extraosseous component at T8. 5. Prostatomegaly. 6. Hyperdense nonspecific 8 mm exophytic lesion from the left kidney lower pole is stable, possibilities include complex cyst or small mass. Surveillance on follow up suggested. 7. Aortic atherosclerosis. Aortic Atherosclerosis (ICD10-I70.0). Electronically Signed   By: Gaylyn Rong M.D.   On: 11/10/2022 09:37    ASSESSMENT AND PLAN: This is a very pleasant 67 years old African-American male with Stage IV (T2a, N2, M1b) non-small cell lung cancer, squamous cell carcinoma presented with left upper lobe lung mass in addition to AP window lymphadenopathy and suspicious bone metastasis to the T8 and L4 vertebrae as well as right retroperitoneal metastatic nodule diagnosed in April 2024.  Molecular studies by [id number] showed no actionable mutations and PD-L1 expression of 70%. The patient underwent palliative combination of systemic chemotherapy with carboplatin for AUC of 5, paclitaxel 175 Mg/M2 and immunotherapy with Libtayo (Cempilimab) 350 Mg IV every 3 weeks with Neulasta support for 4 cycles followed by maintenance treatment with Libtayo (Cempilimab) of the patient has no disease  progression after cycle #4.  He is status post 4 cycles. He is currently undergoing maintenance treatment with single agent Libtayo (Cempilimab) 350 Mg IV every 3 weeks status post 4 cycles.  He has been tolerating this treatment fairly well.    Stage IV Non-Small Cell Lung Cancer (Squamous Cell Carcinoma) Tolerating Libtayo monotherapy well after initial combination therapy with chemoimmunotherapy with carboplatin, paclitaxel and Libtayo. No new symptoms reported. -Continue Libtayo. -Return in 3 weeks for cycle  6.  Hypertension Recent episodes of elevated blood pressure at home, with one episode reaching 200 systolic. Currently on Lisinopril after recent discontinuation of HCTZ. -Advise to keep a log of blood pressure readings at different times of the day for a week. -Schedule appointment with primary care physician, Dr. Neva Seat, to review blood pressure log and adjust medication as needed.  Mild Anemia Stable, no new symptoms. -Monitor with routine blood work.       For the back pain he underwent palliative radiotherapy to the bone disease at T8 and L4 vertebral lesions. For the bone metastasis, he is currently on treatment with Xgeva 120 mg subcutaneously every 6 weeks. The patient was advised to call immediately if he has any concerning symptoms in the interval. The patient voices understanding of current disease status and treatment options and is in agreement with the current care plan.  All questions were answered. The patient knows to call the clinic with any problems, questions or concerns. We can certainly see the patient much sooner if necessary.  The total time spent in the appointment was 30 minutes.  Disclaimer: This note was dictated with voice recognition software. Similar sounding words can inadvertently be transcribed and may not be corrected upon review.

## 2022-12-19 NOTE — Progress Notes (Unsigned)
[facility] OFFICE PROGRESS NOTE  [name], MD [address] [place] [address]  DIAGNOSIS: Stage IV (T2a, N2, M1 B) non-small cell lung cancer, squamous cell carcinoma presented with left upper lobe lung mass in addition to AP window lymphadenopathy and suspicious bone metastasis to the T8 and L4 vertebrae in addition to retroperitoneal metastatic soft tissue nodule diagnosed in April 2024.    Molecular studies by [id number] showed no actionable mutations and PD-L1 expression of 70%.  PRIOR THERAPY: Palliative radiotherapy to the T8 and L4 metastatic bone disease.   CURRENT THERAPY: 1) Systemic chemotherapy with carboplatin for AUC of 5, paclitaxel 175 Mg/M2 and Libtayo (Cempilimab) 350 Mg IV every 3 weeks with Neulasta support for 4 cycles followed by maintenance treatment with Libtayo (Cempilimab) 350 Mg IV every 3 weeks.  He is status post 5 cycles of maintenance.  2) Rivka Barbara every 6 weeks  INTERVAL HISTORY: [name] 67 y.o. male returns to the clinic today for a follow-up visit. He completed chemotherapy/immunotherpay as well as pallitive radiation to the bones. He is currently on mtainiance immunotherapy and tolerating this well.   He is feeling well today without any concerning complaints.   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 He is on an iron supplement for anemia.   At his prior appointments, he was noted to have hypertension he has a follow-up appointment with his PCP on 01/13/23.  The patient knows that he is to keep logs of his readings at home to share with his PCP at his next appointment. Today he denies any fever, chills, or night sweats. His weight is stable. He denies any chest pain or hemoptysis.  He has not been exerting himself recently so he has not noticed any dyspnea.  He has a chronic cough secondary to COPD but reports this is not any different from his baseline.  He denies any chest pain or hemoptysis.  Denies any nausea,  vomiting, or diarrhea.  He denies any constipation recently.  At her prior appointment with me he was endorsing some blurry vision and states that he is overdue for an eye exam and did get a notification in the mail to schedule an appointment.  He will be doing so in the near future.  Denies any headache.  Denies any rashes or skin changes.  He receives [place] every 6 weeks.  He is due for Xgeva today.  He is here today for evaluation repeat blood work before undergoing his next cycle of treatment.  He follows closely with palliative care for which he is currently taking Flexeril and oxycodone.  He is here today for evaluation before proceeding with cycle #6  MEDICAL HISTORY: Past Medical History:  Diagnosis Date   Aortic regurgitation    Aortic stenosis 04/04/2018   Atherosclerosis of native arteries of the extremities with intermittent claudication 09/08/2013   Bilateral impacted cerumen 10/13/2018   Bilateral sensorineural hearing loss 12/14/2018   Dyspnea    Former moderate cigarette smoker (10-19 per day) 05/20/2012   Quit smoking Nov 2013 when hospitalized w/ HTN and chest pain(diagnosed w/ valvular heart disease)   GERD (gastroesophageal reflux disease)    Heart murmur    History of radiation therapy    Lumbar Spine, Thoracic Spine- 06/25/22-07/10/22- Dr. 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  MEDICATIONS:  Current Outpatient Medications  Medication Sig Dispense Refill   albuterol (VENTOLIN HFA) 108 (90 Base) MCG/ACT inhaler Inhale 2 puffs into the lungs every 6 (six) hours as needed for wheezing or shortness of breath. 8 g 6   Ascorbic Acid (VITAMIN C PO) Take 1 tablet by mouth daily.     Cholecalciferol (VITAMIN D3) 1000 UNITS  CAPS Take 1,000 Units by mouth daily.     Coenzyme Q10 (CO Q 10 PO) Take 1 tablet by mouth daily.     Cyanocobalamin (VITAMIN B-12 PO) Take 1 tablet by mouth daily.     cyclobenzaprine (FLEXERIL) 5 MG tablet Take 1 tablet (5 mg total) by mouth 3 (three) times daily as needed for muscle spasms. 30 tablet 0   ELIQUIS 5 MG TABS tablet Take 1 tablet by mouth twice daily 60 tablet 0   ezetimibe (ZETIA) 10 MG tablet Take 1 tablet (10 mg total) by mouth daily. 90 tablet 3   fluticasone (FLONASE) 50 MCG/ACT nasal spray Place 1-2 sprays into both nostrils daily. 16 g 6   KRILL OIL PO Take 1 tablet by mouth daily.     lisinopril (ZESTRIL) 10 MG tablet Take 1 tablet (10 mg total) by mouth daily. 30 tablet 1   MAGNESIUM PO Take 1 tablet by mouth daily.     metoCLOPramide (REGLAN) 10 MG tablet Take 1 tablet (10 mg total) by mouth 4 (four) times daily -  before meals and at bedtime. 56 tablet 0   morphine (MS CONTIN) 15 MG 12 hr tablet Take 1 tablet (15 mg total) by mouth every 12 (twelve) hours. 60 tablet 0   Multiple Vitamins-Minerals (CENTRUM SILVER PO) Take 1 tablet by mouth daily.     Multiple Vitamins-Minerals (ZINC PO) Take 1 tablet by mouth daily.     Naphazoline-Pheniramine (OPCON-A) 0.027-0.315 % SOLN Place 1 drop into both eyes daily as needed (redness).     nitroGLYCERIN (NITROSTAT) 0.4 MG SL tablet Place 1 tablet (0.4 mg total) under the tongue every 5 (five) minutes as needed for chest pain. 25 tablet 1   omeprazole (PRILOSEC) 20 MG capsule Take 1 capsule (20 mg total) by mouth daily. 90 capsule 1   oxyCODONE (OXY IR/ROXICODONE) 5 MG immediate release tablet Take 1 tablet (5 mg total) by mouth every 4 (four) hours as needed for severe pain (pain score 7-10). 90 tablet 0   potassium chloride SA (KLOR-CON M) 20 MEQ tablet Take 1 tablet (20 mEq total) by mouth daily. 6 tablet 0   prochlorperazine (COMPAZINE) 10 MG tablet Take 1 tablet (10 mg total) by mouth every 6 (six) hours as needed for nausea or  vomiting. 30 tablet 0   rosuvastatin (CRESTOR) 5 MG tablet Take 1 tablet (5 mg total) by mouth at bedtime. 90 tablet 1   TURMERIC PO Take 1 tablet by mouth 3 (three) times a week.     umeclidinium-vilanterol (ANORO ELLIPTA) 62.5-25 MCG/ACT AEPB Inhale 1 puff into the lungs daily. 180 each 3   No current facility-administered medications for this visit.   Facility-Administered Medications Ordered in Other Visits  Medication Dose Route Frequency Provider Last Rate Last Admin   0.9 %  sodium chloride infusion   Intravenous Once Si Gaul, MD       cemiplimab-rwlc (LIBTAYO) 350 mg in sodium chloride 0.9 % 100 mL  chemo infusion  350 mg Intravenous Once Si Gaul, MD        SURGICAL HISTORY:  Past Surgical History:  Procedure Laterality Date   BRONCHIAL BIOPSY  05/26/2022   Procedure: BRONCHIAL BIOPSIES;  Surgeon: Josephine Igo, DO;  Location: [facility];  Service: Pulmonary;;   BRONCHIAL BRUSHINGS  05/26/2022   Procedure: BRONCHIAL BRUSHINGS;  Surgeon: Josephine Igo, DO;  Location: [facility];  Service: Pulmonary;;   NO PAST SURGERIES     VIDEO BRONCHOSCOPY  05/26/2022   Procedure: VIDEO BRONCHOSCOPY WITHOUT FLUORO;  Surgeon: Josephine Igo, DO;  Location: [facility];  Service: Pulmonary;;    REVIEW OF SYSTEMS:   Review of Systems  Constitutional: Negative for appetite change, chills, fatigue, fever and unexpected weight change.  HENT: Negative for mouth sores, nosebleeds, sore throat and trouble swallowing.   Eyes: Negative for eye problems and icterus.  Respiratory: Positive for chronic cough. Negative for hemoptysis, shortness of breath and wheezing.   Cardiovascular: Negative for chest pain and leg swelling.  Gastrointestinal: Negative for abdominal pain, constipation, diarrhea, nausea and vomiting.  Genitourinary: Negative for bladder incontinence, difficulty urinating, dysuria, frequency and hematuria.   Musculoskeletal: Negative for back pain, gait  problem, neck pain and neck stiffness.  Skin: Negative for itching and rash.  Neurological: Negative for dizziness, extremity weakness, gait problem, headaches, light-headedness and seizures.  Hematological: Negative for adenopathy. Does not bruise/bleed easily.  Psychiatric/Behavioral: Negative for confusion, depression and sleep disturbance. The patient is not nervous/anxious.     PHYSICAL EXAMINATION:  Blood pressure (!) 167/84, pulse 72, temperature 97.8 F (36.6 C), temperature source Temporal, resp. rate 15, weight 139 lb 8 oz (63.3 kg), SpO2 99%.  ECOG PERFORMANCE STATUS: 1  Physical Exam  Constitutional: Oriented to person, place, and time and well-developed, well-nourished, and in no distress.  HENT:  Head: Normocephalic and atraumatic.  Mouth/Throat: Oropharynx is clear and moist. No oropharyngeal exudate.  Eyes: Conjunctivae are normal. Right eye exhibits no discharge. Left eye exhibits no discharge. No scleral icterus.  Neck: Normal range of motion. Neck supple.  Cardiovascular: Normal rate, regular rhythm, murmur noted (he sees cardiology) and intact distal pulses.   Pulmonary/Chest: Effort normal and breath sounds normal. No respiratory distress. No wheezes. No rales.  Abdominal: Soft. Bowel sounds are normal. Exhibits no distension and no mass. There is no tenderness.  Musculoskeletal: Normal range of motion. Exhibits no edema.  Lymphadenopathy:    No cervical adenopathy.  Neurological: Alert and oriented to person, place, and time. Exhibits normal muscle tone. Gait normal. Coordination normal.  Skin: Skin is warm and dry. No rash noted. Not diaphoretic. No erythema. No pallor.  Psychiatric: Mood, memory and judgment normal.  Vitals reviewed.  LABORATORY DATA: Lab Results  Component Value Date   WBC 3.3 (L) 12/22/2022   HGB 10.7 (L) 12/22/2022   HCT 32.8 (L) 12/22/2022   MCV 92.9 12/22/2022   PLT 165 12/22/2022      Chemistry      Component Value Date/Time    NA 141 12/22/2022 1005   NA 145 (H) 10/21/2021 1038   K 3.2 (L) 12/22/2022 1005   CL 108 12/22/2022 1005   CO2 24 12/22/2022 1005   BUN 11 12/22/2022 1005   BUN 19 10/21/2021 1038   CREATININE 1.00 12/22/2022 1005   CREATININE 1.07 08/27/2014 1511      Component Value Date/Time   CALCIUM 8.4 (L) 12/22/2022 1005   ALKPHOS 59 12/22/2022 1005   AST 13 (L)  12/22/2022 1005   ALT 12 12/22/2022 1005   BILITOT 0.4 12/22/2022 1005       RADIOGRAPHIC STUDIES:  No results found.   ASSESSMENT/PLAN:  This is a very pleasant 67 year old African-American male with stage IV (T2a, N2, M1 B non-small cell lung cancer, squamous cell carcinoma.  He presented with a left upper lobe lung mass in addition to AP window lymphadenopathy and suspicious for metastases to T8 and L4 vertebrae as well as right retroperitoneal metastatic nodule.  He was diagnosed in April 2024.   His molecular studies by [id number] showed no actionable mutation.  His PD-L1 expression is 70%.   He underwent palliative radiation to the metastatic bone lesions.   He also receives [place] every 6 weeks. He is due for this today 12/22/22.   He is underwent palliative systemic chemotherapy and immunotherapy with carboplatin for AUC of 5, paclitaxel 175 mg/m, and immunotherapy with Libtayo 350 mg IV every 3 weeks with Neulasta support.  He is status post 4 cycles.    He started maintenance immunotherapy with [facility] were reviewed. Recommend he proceed with cycle #6  Will see him back for follow-up visit in 3 weeks for evaluation repeat blood work before undergoing cycle #7.   I will arrange for restaging CT scan of the chest, abdomen, pelvis prior to his next follow-up visit.  He will continue taking his iron supplement for anemia.   Will continue taking his blood thinner.  His potassium is slightly low today.  I sent a prescription for 20 mcg of potassium chloride to take p.o. daily for the next 6 days.  Schedule  follow-up with his PCP regarding his hypertension next few weeks.  He will also make a follow-up with his eye doctor.  We will instruct him to take some calcium since he is going to receive Xgeva today.  Corrected calcium 8.5.   The patient was advised to call immediately if he has any concerning symptoms in the interval. The patient voices understanding of current disease status and treatment options and is in agreement with the current care plan. All questions were answered. The patient knows to call the clinic with any problems, questions or concerns. We can certainly see the patient much sooner if necessary          Orders Placed This Encounter  Procedures   CT CHEST ABDOMEN PELVIS W CONTRAST    Standing Status:   Future    Standing Expiration Date:   12/22/2023    Order Specific Question:   If indicated for the ordered procedure, I authorize the administration of contrast media per Radiology protocol    Answer:   Yes    Order Specific Question:   Does the patient have a contrast media/X-ray dye allergy?    Answer:   No    Order Specific Question:   Preferred imaging location?    Answer:   [facility]    Order Specific Question:   If indicated for the ordered procedure, I authorize the administration of oral contrast media per Radiology protocol    Answer:   Yes      The total time spent in the appointment was 20-29 minutes  Rainna Nearhood L Adelae Yodice, PA-C 12/22/22

## 2022-12-20 ENCOUNTER — Other Ambulatory Visit: Payer: Self-pay | Admitting: Physician Assistant

## 2022-12-20 DIAGNOSIS — I2699 Other pulmonary embolism without acute cor pulmonale: Secondary | ICD-10-CM

## 2022-12-22 ENCOUNTER — Inpatient Hospital Stay: Payer: Medicare Other

## 2022-12-22 ENCOUNTER — Inpatient Hospital Stay: Payer: Medicare Other | Attending: Internal Medicine

## 2022-12-22 ENCOUNTER — Inpatient Hospital Stay (HOSPITAL_BASED_OUTPATIENT_CLINIC_OR_DEPARTMENT_OTHER): Payer: Medicare Other | Admitting: Physician Assistant

## 2022-12-22 VITALS — BP 167/84 | HR 72 | Temp 97.8°F | Resp 15 | Wt 139.5 lb

## 2022-12-22 VITALS — BP 153/65

## 2022-12-22 DIAGNOSIS — Z87891 Personal history of nicotine dependence: Secondary | ICD-10-CM | POA: Diagnosis not present

## 2022-12-22 DIAGNOSIS — Z79899 Other long term (current) drug therapy: Secondary | ICD-10-CM | POA: Insufficient documentation

## 2022-12-22 DIAGNOSIS — Z5112 Encounter for antineoplastic immunotherapy: Secondary | ICD-10-CM | POA: Insufficient documentation

## 2022-12-22 DIAGNOSIS — C7951 Secondary malignant neoplasm of bone: Secondary | ICD-10-CM | POA: Diagnosis present

## 2022-12-22 DIAGNOSIS — Z79620 Long term (current) use of immunosuppressive biologic: Secondary | ICD-10-CM | POA: Insufficient documentation

## 2022-12-22 DIAGNOSIS — C3492 Malignant neoplasm of unspecified part of left bronchus or lung: Secondary | ICD-10-CM | POA: Diagnosis not present

## 2022-12-22 DIAGNOSIS — C3412 Malignant neoplasm of upper lobe, left bronchus or lung: Secondary | ICD-10-CM | POA: Insufficient documentation

## 2022-12-22 LAB — CBC WITH DIFFERENTIAL (CANCER CENTER ONLY)
Abs Immature Granulocytes: 0.01 10*3/uL (ref 0.00–0.07)
Basophils Absolute: 0 10*3/uL (ref 0.0–0.1)
Basophils Relative: 0 %
Eosinophils Absolute: 0.1 10*3/uL (ref 0.0–0.5)
Eosinophils Relative: 2 %
HCT: 32.8 % — ABNORMAL LOW (ref 39.0–52.0)
Hemoglobin: 10.7 g/dL — ABNORMAL LOW (ref 13.0–17.0)
Immature Granulocytes: 0 %
Lymphocytes Relative: 22 %
Lymphs Abs: 0.7 10*3/uL (ref 0.7–4.0)
MCH: 30.3 pg (ref 26.0–34.0)
MCHC: 32.6 g/dL (ref 30.0–36.0)
MCV: 92.9 fL (ref 80.0–100.0)
Monocytes Absolute: 0.4 10*3/uL (ref 0.1–1.0)
Monocytes Relative: 11 %
Neutro Abs: 2.1 10*3/uL (ref 1.7–7.7)
Neutrophils Relative %: 65 %
Platelet Count: 165 10*3/uL (ref 150–400)
RBC: 3.53 MIL/uL — ABNORMAL LOW (ref 4.22–5.81)
RDW: 13.3 % (ref 11.5–15.5)
WBC Count: 3.3 10*3/uL — ABNORMAL LOW (ref 4.0–10.5)
nRBC: 0 % (ref 0.0–0.2)

## 2022-12-22 LAB — CMP (CANCER CENTER ONLY)
ALT: 12 U/L (ref 0–44)
AST: 13 U/L — ABNORMAL LOW (ref 15–41)
Albumin: 3.8 g/dL (ref 3.5–5.0)
Alkaline Phosphatase: 59 U/L (ref 38–126)
Anion gap: 9 (ref 5–15)
BUN: 11 mg/dL (ref 8–23)
CO2: 24 mmol/L (ref 22–32)
Calcium: 8.4 mg/dL — ABNORMAL LOW (ref 8.9–10.3)
Chloride: 108 mmol/L (ref 98–111)
Creatinine: 1 mg/dL (ref 0.61–1.24)
GFR, Estimated: >60 mL/min (ref >60)
Glucose, Bld: 147 mg/dL — ABNORMAL HIGH (ref 70–99)
Potassium: 3.2 mmol/L — ABNORMAL LOW (ref 3.5–5.1)
Sodium: 141 mmol/L (ref 135–145)
Total Bilirubin: 0.4 mg/dL (ref <1.2)
Total Protein: 7 g/dL (ref 6.5–8.1)

## 2022-12-22 LAB — TSH: TSH: 0.841 u[IU]/mL (ref 0.350–4.500)

## 2022-12-22 MED ORDER — DENOSUMAB 120 MG/1.7ML ~~LOC~~ SOLN
120.0000 mg | Freq: Once | SUBCUTANEOUS | Status: AC
  Administered 2022-12-22: 120 mg via SUBCUTANEOUS
  Filled 2022-12-22: qty 1.7

## 2022-12-22 MED ORDER — SODIUM CHLORIDE 0.9 % IV SOLN
350.0000 mg | Freq: Once | INTRAVENOUS | Status: AC
  Administered 2022-12-22: 350 mg via INTRAVENOUS
  Filled 2022-12-22: qty 7

## 2022-12-22 MED ORDER — SODIUM CHLORIDE 0.9 % IV SOLN: Freq: Once | INTRAVENOUS | Status: AC

## 2022-12-22 MED ORDER — POTASSIUM CHLORIDE CRYS ER 20 MEQ PO TBCR
20.0000 meq | EXTENDED_RELEASE_TABLET | Freq: Every day | ORAL | 0 refills | Status: AC
Start: 2022-12-22 — End: ?

## 2022-12-22 NOTE — Patient Instructions (Signed)
[facility] - A DEPT OF [facility]  Discharge Instructions: Thank you for choosing [facility] to provide your oncology and hematology care.   If you have a lab appointment with the Cancer Center, please go directly to the Cancer Center and check in at the registration area.   Wear comfortable clothing and clothing appropriate for easy access to any Portacath or PICC line.   We strive to give you quality time with your provider. You may need to reschedule your appointment if you arrive late (15 or more minutes).  Arriving late affects you and other patients whose appointments are after yours.  Also, if you miss three or more appointments without notifying the office, you may be dismissed from the clinic at the provider's discretion.      For prescription refill requests, have your pharmacy contact our office and allow 72 hours for refills to be completed.    Today you received the following chemotherapy and/or immunotherapy agent: Cemiplimab (Libtayo)      To help prevent nausea and vomiting after your treatment, we encourage you to take your nausea medication as directed.  BELOW ARE SYMPTOMS THAT SHOULD BE REPORTED IMMEDIATELY: *FEVER GREATER THAN 100.4 F (38 C) OR HIGHER *CHILLS OR SWEATING *NAUSEA AND VOMITING THAT IS NOT CONTROLLED WITH YOUR NAUSEA MEDICATION *UNUSUAL SHORTNESS OF BREATH *UNUSUAL BRUISING OR BLEEDING *URINARY PROBLEMS (pain or burning when urinating, or frequent urination) *BOWEL PROBLEMS (unusual diarrhea, constipation, pain near the anus) TENDERNESS IN MOUTH AND THROAT WITH OR WITHOUT PRESENCE OF ULCERS (sore throat, sores in mouth, or a toothache) UNUSUAL RASH, SWELLING OR PAIN  UNUSUAL VAGINAL DISCHARGE OR ITCHING   Items with * indicate a potential emergency and should be followed up as soon as possible or go to the Emergency Department if any problems should occur.  Please show the CHEMOTHERAPY ALERT CARD or  IMMUNOTHERAPY ALERT CARD at check-in to the Emergency Department and triage nurse.  Should you have questions after your visit or need to cancel or reschedule your appointment, please contact [facility] - A DEPT OF [facility]  Dept: [phone]  and follow the prompts.  Office hours are 8:00 a.m. to 4:30 p.m. Monday - Friday. Please note that voicemails left after 4:00 p.m. may not be returned until the following business day.  We are closed weekends and major holidays. You have access to a nurse at all times for urgent questions. Please call the main number to the clinic Dept: [phone] and follow the prompts.   For any non-urgent questions, you may also contact your provider using MyChart. We now offer e-Visits for anyone 66 and older to request care online for non-urgent symptoms. For details visit [email or web address].   Also download the MyChart app! Go to the app store, search "MyChart", open the app, select [facility], and log in with your MyChart username and password.  Cemiplimab Injection What is this medication? CEMIPLIMAB (se MIP li mab) treats skin cancer and lung cancer. It works by helping your immune system slow or stop the spread of cancer cells. It is a monoclonal antibody. This medicine may be used for other purposes; ask your health care provider or pharmacist if you have questions. COMMON BRAND NAME(S): LIBTAYO What should I tell my care team before I take this medication? They need to know if you have any of these conditions: Allogeneic stem cell transplant (uses someone else's stem cells) Autoimmune diseases, such  as Crohn disease, ulcerative colitis, lupus History of chest radiation Nervous system problems, such as Guillain-Barre syndrome or myasthenia gravis Organ transplant An unusual or allergic reaction to cemiplimab, other medications, foods, dyes, or preservatives Pregnant or trying to get pregnant Breastfeeding How  should I use this medication? This medication is infused into a vein. It is given by your care team in a hospital or clinic setting. A special MedGuide will be given to you before each treatment. Be sure to read this information carefully each time. Talk to your care team about the use of this medication in children. Special care may be needed. Overdosage: If you think you have taken too much of this medicine contact a poison control center or emergency room at once. NOTE: This medicine is only for you. Do not share this medicine with others. What if I miss a dose? Keep appointments for follow-up doses. It is important not to miss your dose. Call your care team if you are unable to keep an appointment. What may interact with this medication? Interactions have not been studied. This list may not describe all possible interactions. Give your health care provider a list of all the medicines, herbs, non-prescription drugs, or dietary supplements you use. Also tell them if you smoke, drink alcohol, or use illegal drugs. Some items may interact with your medicine. What should I watch for while using this medication? Visit your care team for regular checks on your progress. It may be some time before you see the benefit from this medication. You may need blood work done while taking this medication. This medication may cause serious skin reactions. They can happen weeks to months after starting the medication. Contact your care team right away if you notice fevers or flu-like symptoms with a rash. The rash may be red or purple and then turn into blisters or peeling of the skin. You may also notice a red rash with swelling of the face, lips, or lymph nodes in your neck or under your arms. Tell your care team right away if you have any change in your eyesight. Talk to your care team if you may be pregnant. You will need a negative pregnancy test before starting this medication. Contraception is recommended  while taking this medication and for 4 months after the last dose. Your care team can help you find the option that works for you. Do not breastfeed while taking this medication and for at least 4 months after the last dose. What side effects may I notice from receiving this medication? Side effects that you should report to your care team as soon as possible: Allergic reactions--skin rash, itching, hives, swelling of the face, lips, tongue, or throat Dry cough, shortness of breath or trouble breathing Eye pain, redness, irritation, or discharge with blurry or decreased vision Heart muscle inflammation--unusual weakness or fatigue, shortness of breath, chest pain, fast or irregular heartbeat, dizziness, swelling of the ankles, feet, or hands Hormone gland problems--headache, sensitivity to light, unusual weakness or fatigue, dizziness, fast or irregular heartbeat, increased sensitivity to cold or heat, excessive sweating, constipation, hair loss, increased thirst or amount of urine, tremors or shaking, irritability Infusion reactions--chest pain, shortness of breath or trouble breathing, feeling faint or lightheaded Kidney injury (glomerulonephritis)--decrease in the amount of urine, red or dark brown urine, foamy or bubbly urine, swelling of the ankles, hands, or feet Liver injury--right upper belly pain, loss of appetite, nausea, light-colored stool, dark yellow or brown urine, yellowing skin or eyes, unusual  weakness or fatigue Pain, tingling, or numbness in the hands or feet, muscle weakness, change in vision, confusion or trouble speaking, loss of balance or coordination, trouble walking, seizures Rash, fever, and swollen lymph nodes Redness, blistering, peeling, or loosening of the skin, including inside the mouth Sudden or severe stomach pain, bloody diarrhea, fever, nausea, vomiting Side effects that usually do not require medical attention (report these to your care team if they continue or  are bothersome): Bone, joint, or muscle pain Diarrhea Fatigue Loss of appetite Nausea Skin rash This list may not describe all possible side effects. Call your doctor for medical advice about side effects. You may report side effects to FDA at 1-800-FDA-1088. Where should I keep my medication? This medication is given in a hospital or clinic and will not be stored at home. NOTE: This sheet is a summary. It may not cover all possible information. If you have questions about this medicine, talk to your doctor, pharmacist, or health care provider.  2024 Elsevier/Gold Standard (2022-03-16 00:00:00) Denosumab Injection (Oncology) What is this medication? DENOSUMAB (den oh SUE mab) prevents weakened bones caused by cancer. It may also be used to treat noncancerous bone tumors that cannot be removed by surgery. It can also be used to treat high calcium levels in the blood caused by cancer. It works by blocking a protein that causes bones to break down quickly. This slows down the release of calcium from bones, which lowers calcium levels in your blood. It also makes your bones stronger and less likely to break (fracture). This medicine may be used for other purposes; ask your health care provider or pharmacist if you have questions. COMMON BRAND NAME(S): XGEVA What should I tell my care team before I take this medication? They need to know if you have any of these conditions: Dental disease Having surgery or tooth extraction Infection Kidney disease Low levels of calcium or vitamin D in the blood Malnutrition On hemodialysis Skin conditions or sensitivity Thyroid or parathyroid disease An unusual reaction to denosumab, other medications, foods, dyes, or preservatives Pregnant or trying to get pregnant Breast-feeding How should I use this medication? This medication is for injection under the skin. It is given by your care team in a hospital or clinic setting. A special MedGuide will be  given to you before each treatment. Be sure to read this information carefully each time. Talk to your care team about the use of this medication in children. While it may be prescribed for children as young as 13 years for selected conditions, precautions do apply. Overdosage: If you think you have taken too much of this medicine contact a poison control center or emergency room at once. NOTE: This medicine is only for you. Do not share this medicine with others. What if I miss a dose? Keep appointments for follow-up doses. It is important not to miss your dose. Call your care team if you are unable to keep an appointment. What may interact with this medication? Do not take this medication with any of the following: Other medications containing denosumab This medication may also interact with the following: Medications that lower your chance of fighting infection Steroid medications, such as prednisone or cortisone This list may not describe all possible interactions. Give your health care provider a list of all the medicines, herbs, non-prescription drugs, or dietary supplements you use. Also tell them if you smoke, drink alcohol, or use illegal drugs. Some items may interact with your medicine. What should I  watch for while using this medication? Your condition will be monitored carefully while you are receiving this medication. You may need blood work while taking this medication. This medication may increase your risk of getting an infection. Call your care team for advice if you get a fever, chills, sore throat, or other symptoms of a cold or flu. Do not treat yourself. Try to avoid being around people who are sick. You should make sure you get enough calcium and vitamin D while you are taking this medication, unless your care team tells you not to. Discuss the foods you eat and the vitamins you take with your care team. Some people who take this medication have severe bone, joint, or muscle  pain. This medication may also increase your risk for jaw problems or a broken thigh bone. Tell your care team right away if you have severe pain in your jaw, bones, joints, or muscles. Tell your care team if you have any pain that does not go away or that gets worse. Talk to your care team if you may be pregnant. Serious birth defects can occur if you take this medication during pregnancy and for 5 months after the last dose. You will need a negative pregnancy test before starting this medication. Contraception is recommended while taking this medication and for 5 months after the last dose. Your care team can help you find the option that works for you. What side effects may I notice from receiving this medication? Side effects that you should report to your care team as soon as possible: Allergic reactions--skin rash, itching, hives, swelling of the face, lips, tongue, or throat Bone, joint, or muscle pain Low calcium level--muscle pain or cramps, confusion, tingling, or numbness in the hands or feet Osteonecrosis of the jaw--pain, swelling, or redness in the mouth, numbness of the jaw, poor healing after dental work, unusual discharge from the mouth, visible bones in the mouth Side effects that usually do not require medical attention (report to your care team if they continue or are bothersome): Cough Diarrhea Fatigue Headache Nausea This list may not describe all possible side effects. Call your doctor for medical advice about side effects. You may report side effects to FDA at 1-800-FDA-1088. Where should I keep my medication? This medication is given in a hospital or clinic. It will not be stored at home. NOTE: This sheet is a summary. It may not cover all possible information. If you have questions about this medicine, talk to your doctor, pharmacist, or health care provider.  2024 Elsevier/Gold Standard (2021-06-18 00:00:00) Please take over the counter Calcium tablets as  directed.

## 2022-12-22 NOTE — Progress Notes (Signed)
Per Cassie PA-C OK to proceed with [facility] Hardy today with calcium 8.4 and corrected calcium 8.56 today. Cassie PA-C recommended Pt to start taking calcium supplement at home. This RN educated Pt on recommendation. Pt verbalized understanding and was agreeable with plan.

## 2022-12-23 LAB — T4: T4, Total: 7.8 ug/dL (ref 4.5–12.0)

## 2022-12-30 ENCOUNTER — Other Ambulatory Visit: Payer: Self-pay | Admitting: Family Medicine

## 2022-12-30 DIAGNOSIS — I959 Hypotension, unspecified: Secondary | ICD-10-CM

## 2022-12-30 DIAGNOSIS — I10 Essential (primary) hypertension: Secondary | ICD-10-CM

## 2023-01-04 NOTE — Progress Notes (Unsigned)
Cardiology Office Note:    Date:  01/05/2023   ID:  Albert Council., DOB October 16, 1955, MRN [id number]  PCP:  [name], MD  Cardiologist:  Norman Herrlich, MD    Referring MD: [name], MD    ASSESSMENT:    1. Angina pectoris (HCC)   2. Primary hypertension   3. Pure hypercholesterolemia   4. Frequent PVCs   5. Precordial pain   6. Nonrheumatic aortic valve insufficiency    PLAN:    In order of problems listed above:  Further evaluation cardiac CTA to guide diagnosis and if abnormal maximize antianginal therapy he.  I be hesitant to proceed to cardiac interventions. Important to continue antihypertensive and lipid-lowering therapy with his cancer therapy Continue beta-blocker has no PVCs on today's EKG Clinically does not appear to have severe significant aortic regurgitation   Next appointment: 6 months   Medication Adjustments/Labs and Tests Ordered: Current medicines are reviewed at length with the patient today.  Concerns regarding medicines are outlined above.  Orders Placed This Encounter  Procedures   CT CORONARY MORPH W/CTA COR W/SCORE W/CA W/CM &/OR WO/CM   Basic Metabolic Panel (BMET)   EKG 12-Lead   Meds ordered this encounter  Medications   metoprolol tartrate (LOPRESSOR) 25 MG tablet    Sig: Take 1 tablet (25 mg total) by mouth once for 1 dose. Please take this medication 2 hours before CT    Dispense:  1 tablet    Refill:  0     History of Present Illness:    Albert Schlegel. is a 68 y.o. male with a hx of aortic valve disease with aortic regurgitation frequent PVCs peripheral vascular disease with claudication normal coronary arteriography and a coronary calcium score of 0.  Last seen 10/05/2022.  An echocardiogram 11/11/2022 showing mild concentric LVH normal left ventricular size ejection fraction normal 60 to 65% GLS normal -21%.  Right ventricle normal size function and pressure could not be assessed aortic regurgitation moderate to  severe mild mitral regurgitation and a small pericardial effusion.  He also has stage IV non-small cell lung cancer with left upper lobe mass adenopathy and bone mets with received palliative radiotherapy and systemic chemotherapy.  He is anticoagulated with apixaban for pulmonary embolism. Compliance with diet, lifestyle and medications: Yes  He continues to have a chronic chest pain pattern some of this sounds like possible angina with a pressure sensation relieved with nitroglycerin uses 1-2 times a month nonexertional but he also gets a pins and needle sensation left chest where he is had radiation therapy He is doing well with his cancer is concerned about potential for heart disease and cardiac CTA will be performed. Otherwise well no edema shortness of breath palpitation or syncope Past Medical History:  Diagnosis Date   Aortic regurgitation    Aortic stenosis 04/04/2018   Atherosclerosis of native arteries of the extremities with intermittent claudication 09/08/2013   Bilateral impacted cerumen 10/13/2018   Bilateral sensorineural hearing loss 12/14/2018   Dyspnea    Former moderate cigarette smoker (10-19 per day) 05/20/2012   Quit smoking Nov 2013 when hospitalized w/ HTN and chest pain(diagnosed w/ valvular heart disease)   GERD (gastroesophageal reflux disease)    Heart murmur    History of radiation therapy    Lumbar Spine, Thoracic Spine- 06/25/22-07/10/22- Dr. 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   Ascorbic Acid (VITAMIN C PO) Take 1 tablet by mouth daily.   Cholecalciferol (VITAMIN D3)  1000 UNITS CAPS Take 1,000 Units by mouth daily.   Coenzyme Q10 (CO Q 10 PO) Take 1 tablet by mouth daily.   Cyanocobalamin (VITAMIN B-12 PO) Take 1 tablet by mouth daily.   cyclobenzaprine (FLEXERIL) 5 MG tablet Take 1 tablet (5 mg total) by mouth 3 (three) times daily as needed for muscle spasms.   ELIQUIS 5 MG TABS tablet Take 1 tablet by mouth twice daily   ezetimibe (ZETIA) 10 MG tablet Take 1 tablet (10 mg total) by mouth daily.   fluticasone (FLONASE) 50 MCG/ACT nasal spray Place 1-2 sprays into both nostrils daily.   KRILL OIL PO Take 1 tablet by mouth daily.   lisinopril (ZESTRIL) 10 MG tablet Take 1 tablet by mouth once daily   MAGNESIUM PO Take 1 tablet by mouth daily.   metoCLOPramide (REGLAN) 10 MG tablet Take 1 tablet (10 mg total) by mouth 4 (four) times daily -  before meals and at bedtime.   metoprolol tartrate (LOPRESSOR) 25 MG tablet Take 1 tablet (25 mg total) by mouth once for 1 dose. Please take this medication 2 hours before CT   morphine (MS CONTIN) 15 MG 12 hr tablet Take 1 tablet (15 mg total) by mouth every 12 (twelve) hours.   Multiple Vitamins-Minerals (CENTRUM SILVER PO) Take 1 tablet by mouth daily.   Multiple Vitamins-Minerals (ZINC PO) Take 1 tablet by mouth daily.   Naphazoline-Pheniramine (OPCON-A) 0.027-0.315 % SOLN Place 1 drop into both eyes daily as needed (redness).   nitroGLYCERIN (NITROSTAT) 0.4 MG SL tablet Place 1 tablet (0.4 mg total) under the tongue every 5 (five) minutes as needed for chest pain.   omeprazole (PRILOSEC) 20 MG capsule Take 1 capsule (20 mg total) by mouth daily.   oxyCODONE (OXY IR/ROXICODONE) 5 MG immediate release tablet Take 1 tablet (5 mg total) by mouth every 4 (four) hours as needed for severe pain (pain score 7-10).   potassium chloride SA (KLOR-CON M) 20 MEQ tablet Take 1 tablet (20 mEq total) by mouth daily.   prochlorperazine (COMPAZINE) 10 MG tablet Take 1 tablet (10 mg total) by mouth every 6 (six) hours as needed for  nausea or vomiting.   rosuvastatin (CRESTOR) 5 MG tablet Take 1 tablet (5 mg total) by mouth at bedtime.   TURMERIC PO Take 1 tablet by mouth 3 (three) times a week.   umeclidinium-vilanterol (ANORO ELLIPTA) 62.5-25 MCG/ACT AEPB Inhale 1 puff into the lungs daily.      EKGs/Labs/Other Studies Reviewed:    The following studies were reviewed today:  Cardiac Studies & Procedures     STRESS TESTS  NM MYOCAR MULTI W/SPECT W 12/22/2011  Narrative *RADIOLOGY REPORT*  Clinical Data:  Chest pain and history of hypertension and tobacco use.  MYOCARDIAL IMAGING WITH SPECT (REST AND PHARMACOLOGIC-STRESS) GATED LEFT VENTRICULAR WALL MOTION STUDY LEFT VENTRICULAR EJECTION FRACTION  Technique:  Standard myocardial SPECT imaging was performed after resting intravenous injection of 10 mCi Tc-60m sestamibi. Subsequently, intravenous infusion of Lexiscan was performed under the supervision of the Cardiology staff.  At peak  effect of the drug, 30 mCi Tc-23m sestamibi was injected intravenously and standard myocardial SPECT  imaging was performed.  Quantitative gated imaging was also performed to evaluate left ventricular wall motion, and estimate left ventricular ejection fraction.  Comparison:  None.  Findings: Utilizing gated data, the end-diastolic volume is estimated to be 184 ml and the end-systolic volume 91 ml. Calculated ejection fraction is 51%.  Gated wall motion analysis shows normal left ventricular wall motion.  SPECT imaging shows attenuation of the basilar aspect of the inferior wall.  Component of scar is suspected.  However, this may also be secondary to a component of diaphragmatic attenuation. There is no evidence of inducible ischemia with Lexiscan administration.  IMPRESSION:  1.  Normal left ventricular function with quantitative ejection fraction of 51% and normal wall motion identified. 2.  Possible component of inferior wall scar involving the  basilar aspect of the inferior wall.  There may be additional component of diaphragmatic attenuation.  No inducible ischemia.   Original Report Authenticated By: Irish Lack, M.D.   ECHOCARDIOGRAM  ECHOCARDIOGRAM COMPLETE 11/11/2022  Narrative ECHOCARDIOGRAM REPORT    Patient Name:   Albert Respicio. Date of Exam: 11/11/2022 Medical Rec #:  [id number]       Height:       69.0 in Accession #:    [id number]      Weight:       140.7 lb Date of Birth:  Nov 12, 1955       BSA:          1.779 m Patient Age:    67 years        BP:           153/63 mmHg Patient Gender: M               HR:           63 bpm. Exam Location:  [place]  Procedure: 2D Echo, Cardiac Doppler, Color Doppler and Strain Analysis  Indications:    Angina pectoris (HCC) [I20.9 (ICD-10-CM)]; Nonrheumatic aortic valve stenosis [I35.0 (ICD-10-CM)]; Primary hypertension [I10 (ICD-10-CM)]  History:        Patient has prior history of Echocardiogram examinations, most recent 09/24/2021. Aortic Valve Disease; Risk Factors:Hypertension.  Sonographer:    Margreta Journey RDCS Referring Phys: [id number] Thomasenia Dowse J Irmgard Rampersaud  IMPRESSIONS   1. Left ventricular ejection fraction, by estimation, is 60 to 65%. The left ventricle has normal function. The left ventricle has no regional wall motion abnormalities. There is mild concentric left ventricular hypertrophy. Left ventricular diastolic parameters are indeterminate. The average left ventricular global longitudinal strain is -21.0 %. The global longitudinal strain is normal. 2. Right ventricular systolic function is normal. The right ventricular size is normal. Tricuspid regurgitation signal is inadequate for assessing PA pressure. 3. Moderate to severe aortic insufficiency with eccentric jet. Doppler suboptial to assess diastolic flow reversal in descending thoracic aorta. Appears secondary to thickened aortic valve cusps. No stenosis. 4. There is borderline dilatation of the aortic  root, measuring 38 mm. 5. The mitral valve is grossly normal. Mild mitral valve regurgitation. No evidence of mitral stenosis. 6. The inferior vena cava is normal in size with <50% respiratory variability, suggesting right atrial pressure of 8 mmHg. 7. A small pericardial effusion is present. The pericardial effusion is anterior to the right ventricle and localized near the right atrium. There is no evidence of cardiac tamponade.  Comparison(s): A prior study was performed on 09/24/2021. AS was reported, with LVOT vax  1.51m/s and AV Vmax 2.70m/s and AVA 2.53cm2. AI was reported mild. There was no pericardial effusion.  Conclusion(s)/Recommendation(s): Consider TEE to further assess AV structure and severity of AI if clinically indicated. Alternately follow up closely for interval change given the new pericardial effusion observed.  FINDINGS Left Ventricle: Left ventricular ejection fraction, by estimation, is 60 to 65%. The left ventricle has normal function. The left ventricle has no regional wall motion abnormalities. The average left ventricular global longitudinal strain is -21.0 %. The global longitudinal strain is normal. The left ventricular internal cavity size was normal in size. There is mild concentric left ventricular hypertrophy. Left ventricular diastolic parameters are indeterminate.  Right Ventricle: The right ventricular size is normal. Right vetricular wall thickness was not well visualized. Right ventricular systolic function is normal. Tricuspid regurgitation signal is inadequate for assessing PA pressure.  Left Atrium: Volume index 27ml/m2. Left atrial size was normal in size.  Right Atrium: Right atrial size was normal in size.  Pericardium: A small pericardial effusion is present. The pericardial effusion is anterior to the right ventricle and localized near the right atrium. There is no evidence of cardiac tamponade.  Mitral Valve: The mitral valve is grossly normal. Mild  mitral valve regurgitation. No evidence of mitral valve stenosis.  Tricuspid Valve: The tricuspid valve is not well visualized. Tricuspid valve regurgitation is trivial.  Aortic Valve: Moderately thickened leaflets, with focal nodular appearance on noncoronary and left coronary cusp. The aortic valve is tricuspid. Aortic valve regurgitation is moderate to severe, eccentric. Aortic regurgitation PHT measures 408 msec. Aortic valve sclerosis/calcification is present, without any evidence of aortic stenosis. Aortic valve mean gradient measures 8.8 mmHg. Aortic valve peak gradient measures 16.4 mmHg. Aortic valve area, by VTI measures 2.90 cm.  Pulmonic Valve: The pulmonic valve was not well visualized. Pulmonic valve regurgitation is not visualized.  Aorta: The ascending aorta is structurally normal, with no evidence of dilitation. There is borderline dilatation of the aortic root, measuring 38 mm.  Venous: The inferior vena cava is normal in size with less than 50% respiratory variability, suggesting right atrial pressure of 8 mmHg.  IAS/Shunts: The interatrial septum was not well visualized.   LEFT VENTRICLE PLAX 2D LVIDd:         5.10 cm   Diastology LVIDs:         3.50 cm   LV e' medial:    6.85 cm/s LV PW:         1.20 cm   LV E/e' medial:  16.9 LV IVS:        1.20 cm   LV e' lateral:   6.53 cm/s LVOT diam:     2.10 cm   LV E/e' lateral: 17.7 LV SV:         140 LV SV Index:   79        2D Longitudinal Strain LVOT Area:     3.46 cm  2D Strain GLS Avg:     -21.0 %   RIGHT VENTRICLE             IVC RV Basal diam:  3.00 cm     IVC diam: 2.00 cm RV Mid diam:    2.80 cm RV S prime:     12.30 cm/s TAPSE (M-mode): 2.5 cm  LEFT ATRIUM             Index        RIGHT ATRIUM  Index LA diam:        4.00 cm 2.25 cm/m   RA Area:     15.20 cm LA Vol (A2C):   54.9 ml 30.86 ml/m  RA Volume:   37.30 ml  20.96 ml/m LA Vol (A4C):   42.8 ml 24.06 ml/m LA Biplane Vol: 52.0 ml 29.23  ml/m AORTIC VALVE AV Area (Vmax):    3.17 cm AV Area (Vmean):   2.74 cm AV Area (VTI):     2.90 cm AV Vmax:           202.50 cm/s AV Vmean:          138.000 cm/s AV VTI:            0.483 m AV Peak Grad:      16.4 mmHg AV Mean Grad:      8.8 mmHg LVOT Vmax:         185.50 cm/s LVOT Vmean:        109.000 cm/s LVOT VTI:          0.405 m LVOT/AV VTI ratio: 0.84 AI PHT:            408 msec  AORTA Ao Root diam: 3.80 cm Ao Asc diam:  3.30 cm Ao Desc diam: 2.50 cm  MITRAL VALVE MV Area (PHT): 3.95 cm     SHUNTS MV Decel Time: 192 msec     Systemic VTI:  0.40 m MR Peak grad: 97.6 mmHg     Systemic Diam: 2.10 cm MR Vmax:      494.00 cm/s MV E velocity: 115.50 cm/s MV A velocity: 135.50 cm/s MV E/A ratio:  0.85  Sreedhar reddy Madireddy Electronically signed by Vern Claude reddy Madireddy Signature Date/Time: 11/11/2022/12:30:43 PM    Final    MONITORS  LONG TERM MONITOR (3-14 DAYS) 09/23/2021  Narrative Patch Wear Time:  7 days and 5 hours (2023-07-27T11:55:53-0400 to 2023-08-03T17:20:39-0400)  Patient had a min HR of 43 bpm, max HR of 160 bpm, and avg HR of 59 bpm. Predominant underlying rhythm was Sinus Rhythm.  There were no pauses of 3 seconds or greater and no episodes of second or third-degree AV nodal block.  There were 2 triggered events both of which were sinus rhythm without ectopy.  4 Supraventricular Tachycardia runs occurred, the run with the fastest interval lasting 5 beats with a max rate of 160 bpm, the longest lasting 8 beats with an avg rate of 100 bpm. Isolated SVEs were rare (<1.0%), SVE Couplets were rare (<1.0%), and SVE Triplets were rare (<1.0%).  There were no episodes of atrial fibrillation or flutter.  Isolated VEs were rare (<1.0%, 3105), VE Couplets were rare (<1.0%, 29), and VE Triplets were rare (<1.0%, 1). Ventricular Bigeminy and Trigeminy were present.   CT SCANS  CT CORONARY MORPH W/CTA COR W/SCORE 09/23/2021  Addendum 09/23/2021  5:24  PM ADDENDUM REPORT: 09/23/2021 17:22  CLINICAL DATA:  CP  EXAM: Cardiac/Coronary  CTA  TECHNIQUE: The patient was scanned on a [employer].  FINDINGS: A 100 kV prospective scan was triggered in the descending thoracic aorta at 111 HU's. Axial non-contrast 3 mm slices were carried out through the heart. The data set was analyzed on a dedicated work station and scored using the Agatson method. Gantry rotation speed was 250 msecs and collimation was .6 mm. No beta blockade and 0.8 mg of sl NTG was given. The 3D data set was reconstructed in 5% intervals of the 67-82 % of the R-R  cycle. Diastolic phases were analyzed on a dedicated work station using MPR, MIP and VRT modes. The patient received 80 cc of contrast.  Aorta: Normal size. Mild calcifications in the ascending aorta noted. No dissection.  Aortic Valve:  Trileaflet. Mild calcifications.  Coronary Arteries:  Normal coronary origin.  Left dominance.  RCA is a small, non- dominant artery.  There is no plaque.  Left main is a large artery that gives rise to LAD and LCX arteries.  LAD is a large vessel that has no plaque. This artery gives rise to moderate size D1 and small D2.  LCX is a non-dominant artery that gives rise to one large OM1, large OM2 and moderate size OM3 branches. There is no plaque.  Other findings:  Normal pulmonary vein drainage into the left atrium. Common left pulmonary trunk noted - normal variant.  Normal left atrial appendage without a thrombus.  Normal size of the pulmonary artery.  IMPRESSION: 1. Coronary calcium score of 0. This was 0 percentile for age and sex matched control.  2. Normal coronary origin with right dominance.  3. CAD-RADS 0. No evidence of CAD (0%). Consider non-atherosclerotic causes of chest pain.  4. Mild calcifications of the aorta and aortic valve noted.   Electronically Signed By: Gypsy Balsam M.D. On: 09/23/2021  17:22  Narrative EXAM: OVER-READ INTERPRETATION  CT CHEST  The following report is a limited chest CT over-read performed by radiologist Dr. Gaylyn Rong of [facility] on 09/23/2021. This over-read does not include interpretation of cardiac or coronary anatomy or pathology. The coronary CTA interpretation by the cardiologist is attached.  COMPARISON:  Lung cancer screening CT chest 09/23/2020 (exactly 1 year prior to current exam)  FINDINGS: Extracardiac vascular: Unremarkable  Mediastinum/Nodes: Unremarkable  Lungs/Pleura: There is mucus in the bronchus intermedius, right lower lobe bronchus, and particularly mucus plugging in the posterior basal segmental bronchus of the right lower lobe.  Upper Abdomen: 0.8 by 0.6 by 0.6 cm subcapsular focus of arterial phase enhancement in segment 4a of the liver on image 48 series 10.  Musculoskeletal: Unremarkable  IMPRESSION: 1. Mucous plugging in the posterior basal segmental bronchus of the right lower lobe with additional mucus in the bronchus intermedius and right lower lobe bronchus. No alveolar airspace opacity identified. 2. 8 mm in long axis subcapsular focus of arterial phase enhancement in segment 4a of the liver. Based on location and appearance is most likely from vascular shunting or a small flash filling hemangioma. Benign etiology is favored. If the patient has a history of gastrointestinal malignancy or abnormal liver enzymes, consider dedicated hepatic protocol MRI with and without contrast.  Electronically Signed: By: Gaylyn Rong M.D. On: 09/23/2021 14:33          EKG Interpretation Date/Time:  Tuesday January 05 2023 10:36:24 EST Ventricular Rate:  56 PR Interval:  158 QRS Duration:  92 QT Interval:  470 QTC Calculation: 453 R Axis:   -22  Text Interpretation: Sinus bradycardia Left atrial enlargement Poor R wave progression Abnormal ECG When compared with ECG of 05-Oct-2022  10:12, Vent. rate has decreased BY  30 BPM Questionable change in QRS axis Confirmed by Norman Herrlich ([id number]) on 01/05/2023 10:38:28 AM Richard I am going to see this Recent Labs: 08/25/2022: B Natriuretic Peptide 72.3 12/22/2022: ALT 12; BUN 11; Creatinine 1.00; Hemoglobin 10.7; Platelet Count 165; Potassium 3.2; Sodium 141; TSH 0.841  Recent Lipid Panel    Component Value Date/Time   CHOL 169 10/21/2021 1038  TRIG 51 10/21/2021 1038   HDL 72 10/21/2021 1038   CHOLHDL 2.3 10/21/2021 1038   CHOLHDL 3 06/12/2021 1423   VLDL 14.4 06/12/2021 1423   LDLCALC 87 10/21/2021 1038    Physical Exam:    VS:  BP (!) 158/74 (BP Location: Left Arm, Patient Position: Sitting)   Pulse (!) 59   Ht 5\' 9"  (1.753 m)   Wt 143 lb 9.6 oz (65.1 kg)   SpO2 97%   BMI 21.21 kg/m     Wt Readings from Last 3 Encounters:  01/05/23 143 lb 9.6 oz (65.1 kg)  12/22/22 139 lb 8 oz (63.3 kg)  12/01/22 140 lb 7 oz (63.7 kg)     GEN: He does not look chronically ill or debilitated he has no chest wall tenderness well nourished, well developed in no acute distress HEENT: Normal NECK: No JVD; No carotid bruits LYMPHATICS: No lymphadenopathy CARDIAC: RRR, no murmurs, rubs, gallops RESPIRATORY:  Clear to auscultation without rales, wheezing or rhonchi  ABDOMEN: Soft, non-tender, non-distended MUSCULOSKELETAL:  No edema; No deformity  SKIN: Warm and dry NEUROLOGIC:  Alert and oriented x 3 PSYCHIATRIC:  Normal affect    Signed, Norman Herrlich, MD  01/05/2023 12:41 PM    [facility]

## 2023-01-05 ENCOUNTER — Ambulatory Visit: Payer: Medicare Other | Attending: Cardiology | Admitting: Cardiology

## 2023-01-05 ENCOUNTER — Encounter: Payer: Self-pay | Admitting: Cardiology

## 2023-01-05 VITALS — BP 158/74 | HR 59 | Ht 69.0 in | Wt 143.6 lb

## 2023-01-05 DIAGNOSIS — I10 Essential (primary) hypertension: Secondary | ICD-10-CM | POA: Diagnosis present

## 2023-01-05 DIAGNOSIS — I209 Angina pectoris, unspecified: Secondary | ICD-10-CM | POA: Diagnosis present

## 2023-01-05 DIAGNOSIS — I351 Nonrheumatic aortic (valve) insufficiency: Secondary | ICD-10-CM | POA: Diagnosis present

## 2023-01-05 DIAGNOSIS — R072 Precordial pain: Secondary | ICD-10-CM | POA: Diagnosis present

## 2023-01-05 DIAGNOSIS — E7800 Pure hypercholesterolemia, unspecified: Secondary | ICD-10-CM | POA: Insufficient documentation

## 2023-01-05 DIAGNOSIS — I493 Ventricular premature depolarization: Secondary | ICD-10-CM | POA: Diagnosis present

## 2023-01-05 DIAGNOSIS — I350 Nonrheumatic aortic (valve) stenosis: Secondary | ICD-10-CM

## 2023-01-05 MED ORDER — METOPROLOL TARTRATE 25 MG PO TABS
25.0000 mg | ORAL_TABLET | Freq: Once | ORAL | 0 refills | Status: AC
Start: 2023-01-05 — End: 2023-12-28

## 2023-01-05 NOTE — Patient Instructions (Signed)
Medication Instructions:  Your physician recommends that you continue on your current medications as directed. Please refer to the Current Medication list given to you today.  *If you need a refill on your cardiac medications before your next appointment, please call your pharmacy*   Lab Work: Your physician recommends that you return for lab work in:   Labs today: BMP  If you have labs (blood work) drawn today and your tests are completely normal, you will receive your results only by: MyChart Message (if you have MyChart) OR A paper copy in the mail If you have any lab test that is abnormal or we need to change your treatment, we will call you to review the results.   Testing/Procedures:   Your cardiac CT will be scheduled at one of the below locations:   [facility] [address] [place], [place] [address] [phone]  OR  [facility] [address] [place], [place] [address] [phone]  OR   [facility] [address] [place], [place] [address] [phone]  If scheduled at [facility], please arrive at the [facility] and Children's Entrance (Entrance C2) of [facility] 30 minutes prior to test start time. You can use the FREE valet parking offered at entrance C (encouraged to control the heart rate for the test)  Proceed to the [facility] Radiology Department (first floor) to check-in and test prep.  All radiology patients and guests should use entrance C2 at [facility], accessed from [facility], even though the hospital's physical address listed is [address].    If scheduled at [facility] or [facility], please arrive 15 mins early for check-in and test prep.  There is spacious parking and easy access to the radiology department from the [facility] Heart and Vascular entrance. Please  enter here and check-in with the desk attendant.   Please follow these instructions carefully (unless otherwise directed):  An IV will be required for this test and Nitroglycerin will be given.  Hold all erectile dysfunction medications at least 3 days (72 hrs) prior to test. (Ie viagra, cialis, sildenafil, tadalafil, etc)   On the Night Before the Test: Be sure to Drink plenty of water. Do not consume any caffeinated/decaffeinated beverages or chocolate 12 hours prior to your test. Do not take any antihistamines 12 hours prior to your test.  On the Day of the Test: Drink plenty of water until 1 hour prior to the test. Do not eat any food 1 hour prior to test. You may take your regular medications prior to the test.  Take metoprolol (Lopressor) two hours prior to test. FEMALES- please wear underwire-free bra if available, avoid dresses & tight clothing      After the Test: Drink plenty of water. After receiving IV contrast, you may experience a mild flushed feeling. This is normal. On occasion, you may experience a mild rash up to 24 hours after the test. This is not dangerous. If this occurs, you can take Benadryl 25 mg and increase your fluid intake. If you experience trouble breathing, this can be serious. If it is severe call 911 IMMEDIATELY. If it is mild, please call our office.  We will call to schedule your test 2-4 weeks out understanding that some insurance companies will need an authorization prior to the service being performed.   For more information and frequently asked questions, please visit  our website : [email or web address]  For non-scheduling related questions, please contact the cardiac imaging nurse navigator should you have any questions/concerns: Cardiac Imaging Nurse Navigators Direct Office Dial: [phone]   For scheduling needs, including cancellations and rescheduling, please call [place], [phone].    Follow-Up: At [facility], you and your health needs are our priority.  As part of our continuing mission to provide you with exceptional heart care, we have created designated Provider Care Teams.  These Care Teams include your primary Cardiologist (physician) and Advanced Practice Providers (APPs -  Physician Assistants and Nurse Practitioners) who all work together to provide you with the care you need, when you need it.  We recommend signing up for the patient portal called "MyChart".  Sign up information is provided on this After Visit Summary.  MyChart is used to connect with patients for Virtual Visits (Telemedicine).  Patients are able to view lab/test results, encounter notes, upcoming appointments, etc.  Non-urgent messages can be sent to your provider as well.   To learn more about what you can do with MyChart, go to [email or web address].    Your next appointment:   6 month(s)  Provider:   Norman Herrlich, MD    Other Instructions None

## 2023-01-06 ENCOUNTER — Telehealth: Payer: Self-pay | Admitting: Emergency Medicine

## 2023-01-06 ENCOUNTER — Ambulatory Visit (HOSPITAL_COMMUNITY)
Admission: RE | Admit: 2023-01-06 | Discharge: 2023-01-06 | Disposition: A | Discharge status: Home / Self Care | Payer: Medicare Other | Source: Ambulatory Visit | Attending: Physician Assistant | Admitting: Physician Assistant

## 2023-01-06 ENCOUNTER — Encounter: Payer: Self-pay | Admitting: Internal Medicine

## 2023-01-06 DIAGNOSIS — C3492 Malignant neoplasm of unspecified part of left bronchus or lung: Secondary | ICD-10-CM | POA: Diagnosis present

## 2023-01-06 LAB — BASIC METABOLIC PANEL
BUN/Creatinine Ratio: 13 (ref 10–24)
BUN: 11 mg/dL (ref 8–27)
CO2: 22 mmol/L (ref 20–29)
Calcium: 8.4 mg/dL — ABNORMAL LOW (ref 8.6–10.2)
Chloride: 109 mmol/L — ABNORMAL HIGH (ref 96–106)
Creatinine, Ser: 0.87 mg/dL (ref 0.76–1.27)
Glucose: 76 mg/dL (ref 70–99)
Potassium: 3.6 mmol/L (ref 3.5–5.2)
Sodium: 146 mmol/L — ABNORMAL HIGH (ref 134–144)
eGFR: 95 mL/min/{1.73_m2} (ref >59)

## 2023-01-06 MED ORDER — IOHEXOL 300 MG/ML  SOLN
100.0000 mL | Freq: Once | INTRAMUSCULAR | Status: AC | PRN
  Administered 2023-01-06: 100 mL via INTRAVENOUS

## 2023-01-06 NOTE — Telephone Encounter (Signed)
Noted  

## 2023-01-06 NOTE — Telephone Encounter (Signed)
-----   Message from Norman Herrlich sent at 01/06/2023  3:42 PM EST ----- Normal or stable result

## 2023-01-06 NOTE — Telephone Encounter (Signed)
Message sent in MyChart per Dr. Hulen Shouts note. Routed to PCP

## 2023-01-08 NOTE — Progress Notes (Unsigned)
Palliative Medicine [facility]  [phone] [phone]   Name: Albert Hardy. Date: 01/08/2023 MRN: [id number]  DOB: Apr 07, 1955  Patient Care Team: [name], MD as PCP - General (Family Medicine)    INTERVAL HISTORY: Albert Hardy. is a 67 y.o. 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 Palliative ask to see for symptom management and goals of care   SOCIAL HISTORY:     reports that he quit smoking about 11 years ago. His smoking use included cigarettes. He started smoking about 41 years ago. He has a 30 pack-year smoking history. He has never used smokeless tobacco. He reports current alcohol use. He reports current drug use. Frequency: 1.00 time per week. Drug: Marijuana.  ADVANCE DIRECTIVES:  None on file  CODE STATUS: Full code  PAST MEDICAL HISTORY: Past Medical History:  Diagnosis Date  . Aortic regurgitation   . Aortic stenosis 04/04/2018  . Atherosclerosis of native arteries of the extremities with intermittent claudication 09/08/2013  . Bilateral impacted cerumen 10/13/2018  . Bilateral sensorineural hearing loss 12/14/2018  . Dyspnea   . Former moderate cigarette smoker (10-19 per day) 05/20/2012   Quit smoking Nov 2013 when hospitalized w/ HTN and chest pain(diagnosed w/ valvular heart disease)  . GERD (gastroesophageal reflux disease)   . Heart murmur   . History of radiation therapy    Lumbar Spine, Thoracic Spine- 06/25/22-07/10/22- Dr. Antony Blackbird  . Hyperlipidemia   . Hypertension   . 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 Peripheral vascular disease with claudication    ABI .49    . 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 Substance abuse (HCC)     ALLERGIES:  is allergic to lipitor [atorvastatin] and pravastatin.  MEDICATIONS:  Current Outpatient Medications  Medication Sig Dispense Refill  . albuterol (VENTOLIN HFA)  108 (90 Base) MCG/ACT inhaler Inhale 2 puffs into the lungs every 6 (six) hours as needed for wheezing or shortness of breath. 8 g 6  . Ascorbic Acid (VITAMIN C PO) Take 1 tablet by mouth daily.    . Cholecalciferol (VITAMIN D3) 1000 UNITS CAPS Take 1,000 Units by mouth daily.    . Coenzyme Q10 (CO Q 10 PO) Take 1 tablet by mouth daily.    . Cyanocobalamin (VITAMIN B-12 PO) Take 1 tablet by mouth daily.    . cyclobenzaprine (FLEXERIL) 5 MG tablet Take 1 tablet (5 mg total) by mouth 3 (three) times daily as needed for muscle spasms. 30 tablet 0  . ELIQUIS 5 MG TABS tablet Take 1 tablet by mouth twice daily 60 tablet 0  . ezetimibe (ZETIA) 10 MG tablet Take 1 tablet (10 mg total) by mouth daily. 90 tablet 3  . fluticasone (FLONASE) 50 MCG/ACT nasal spray Place 1-2 sprays into both nostrils daily. 16 g 6  . KRILL OIL PO Take 1 tablet by mouth daily.    Marland Kitchen lisinopril (ZESTRIL) 10 MG tablet Take 1 tablet by mouth once daily 30 tablet 0  . MAGNESIUM PO Take 1 tablet by mouth daily.    . metoCLOPramide (REGLAN) 10 MG tablet Take 1 tablet (10 mg total) by mouth 4 (four) times daily -  before meals and at bedtime. 56 tablet 0  . metoprolol tartrate (LOPRESSOR) 25 MG tablet Take 1 tablet (25 mg total) by mouth once for 1 dose. Please take this medication 2 hours before CT 1 tablet 0  . morphine (  MS CONTIN) 15 MG 12 hr tablet Take 1 tablet (15 mg total) by mouth every 12 (twelve) hours. 60 tablet 0  . Multiple Vitamins-Minerals (CENTRUM SILVER PO) Take 1 tablet by mouth daily.    . Multiple Vitamins-Minerals (ZINC PO) Take 1 tablet by mouth daily.    . Naphazoline-Pheniramine (OPCON-A) 0.027-0.315 % SOLN Place 1 drop into both eyes daily as needed (redness).    . nitroGLYCERIN (NITROSTAT) 0.4 MG SL tablet Place 1 tablet (0.4 mg total) under the tongue every 5 (five) minutes as needed for chest pain. 25 tablet 1  . omeprazole (PRILOSEC) 20 MG capsule Take 1 capsule (20 mg total) by mouth daily. 90 capsule 1   . oxyCODONE (OXY IR/ROXICODONE) 5 MG immediate release tablet Take 1 tablet (5 mg total) by mouth every 4 (four) hours as needed for severe pain (pain score 7-10). 90 tablet 0  . potassium chloride SA (KLOR-CON M) 20 MEQ tablet Take 1 tablet (20 mEq total) by mouth daily. 6 tablet 0  . prochlorperazine (COMPAZINE) 10 MG tablet Take 1 tablet (10 mg total) by mouth every 6 (six) hours as needed for nausea or vomiting. 30 tablet 0  . rosuvastatin (CRESTOR) 5 MG tablet Take 1 tablet (5 mg total) by mouth at bedtime. 90 tablet 1  . TURMERIC PO Take 1 tablet by mouth 3 (three) times a week.    . umeclidinium-vilanterol (ANORO ELLIPTA) 62.5-25 MCG/ACT AEPB Inhale 1 puff into the lungs daily. 180 each 3   No current facility-administered medications for this visit.    VITAL SIGNS: There were no vitals taken for this visit. There were no vitals filed for this visit.  Estimated body mass index is 21.21 kg/m as calculated from the following:   Height as of 01/05/23: 5\' 9"  (1.753 m).   Weight as of 01/05/23: 143 lb 9.6 oz (65.1 kg).     Latest Ref Rng & Units 12/22/2022   10:05 AM 12/01/2022   11:11 AM 11/10/2022    1:10 PM  CBC  WBC 4.0 - 10.5 K/uL 3.3  3.0  3.1   Hemoglobin 13.0 - 17.0 g/dL 62.9  52.8  41.3   Hematocrit 39.0 - 52.0 % 32.8  33.7  31.6   Platelets 150 - 400 K/uL 165  193  230        Latest Ref Rng & Units 01/05/2023   11:14 AM 12/22/2022   10:05 AM 12/01/2022   11:11 AM  CMP  Glucose 70 - 99 mg/dL 76  244  010   BUN 8 - 27 mg/dL 11  11  13    Creatinine 0.76 - 1.27 mg/dL 2.72  5.36  6.44   Sodium 134 - 144 mmol/L 146  141  143   Potassium 3.5 - 5.2 mmol/L 3.6  3.2  3.8   Chloride 96 - 106 mmol/L 109  108  110   CO2 20 - 29 mmol/L 22  24  26    Calcium 8.6 - 10.2 mg/dL 8.4  8.4  8.7   Total Protein 6.5 - 8.1 g/dL  7.0  7.0   Total Bilirubin <1.2 mg/dL  0.4  0.2   Alkaline Phos 38 - 126 U/L  59  51   AST 15 - 41 U/L  13  19   ALT 0 - 44 U/L  12  22     PERFORMANCE  STATUS (ECOG) : 1 - Symptomatic but completely ambulatory   Physical Exam General: NAD Cardiovascular: RRR Pulmonary: normal  breathing pattern  Extremities: no edema, no joint deformities Skin: no rashes Neurological: AAO x4  IMPRESSION: I saw Mr. Browder during his infusion. No acute distress. No family present. States he is doing well overall. Denies nausea, vomiting, constipation, or diarrhea. Is remaining as active as possible. Appreciative of increase in appetite.   Neoplasm related pain Mr. Loughnane reports his pain is well-controlled on current regimen.  Some days are better than others.  Occasional back spasms however are manageable.    We discussed his regimen at length. He is currently taking MS Contin 15 mg every 12 hours, Oxy IR 5mg  every 4-6  hours as needed for breakthrough pain.  Flexeril as needed for spasms.    No changes to current regimen at this time.  Will continue to closely monitor.   Constipation Much improved with daily regimen to include Senna-S 2 tabs twice daily.   3. Decreased appetite/Weight loss  Appetite slowly improving. Current weight 140lbs up from 135 lbs.  4. Goals of Care   06/18/22-  We discussed his current illness and what it means in the larger context of hsi on-going co-morbidities. Natural disease trajectory and expectations were discussed.  Mr. Philip and his wife are realistic in their expectations and understanding of his incurable cancer. He knows all treatment is palliative focused. Wishes to continue taking things one day at a time focusing on his quality of life allowing him every opportunity to continue to thrive.   We discussed Her current illness and what it means in the larger context of Her on-going co-morbidities. Natural disease trajectory and expectations were discussed.  I discussed the importance of continued conversation with family and their medical providers regarding overall plan of care and treatment options, ensuring  decisions are within the context of the patients values and GOCs.  PLAN:  Oxy IR 5mg  every 4-6 hours as needed for breakthrough pain  MS Contin 15 mg every 12 hours.  Tolerating well. Flexeril as needed Senna 2 tabs twice daily  Miralax daily  Pain contract on file I will plan to see patient back in 3-4 weeks in collaboration to other oncology appointments.     Patient expressed understanding and was in agreement with this plan. He also understands that He can call the clinic at any time with any questions, concerns, or complaints.   Any controlled substances utilized were prescribed in the context of palliative care. PDMP has been reviewed.    Visit consisted of counseling and education dealing with the complex and emotionally intense issues of symptom management and palliative care in the setting of serious and potentially life-threatening illness.Greater than 50%  of this time was spent counseling and coordinating care related to the above assessment and plan.  Willette Alma, AGPCNP-BC  Palliative Medicine Team/[facility]  *Please note that this is a verbal dictation therefore any spelling or grammatical errors are due to the "Dragon Medical One" system interpretation.

## 2023-01-10 NOTE — Progress Notes (Unsigned)
Patient Care Team: [name], MD as PCP - General (Family Medicine)  Clinic Day:  01/12/2023  Referring physician: Si Gaul, MD  ASSESSMENT & PLAN:   Assessment & Plan: Squamous cell carcinoma of lung, stage IV, left (HCC) (T2a, N2, M1 B non-small cell lung cancer, squamous cell carcinoma.  He presented with a left upper lobe lung mass in addition to AP window lymphadenopathy and suspicious for metastases to T8 and L4 vertebrae as well as right retroperitoneal metastatic nodule.  He was diagnosed in April 2024.   His molecular studies by [id number] showed no actionable mutation.  His PD-L1 expression is 70%.  He underwent palliative radiation to the metastatic bone lesions.  He also receives [place] every 6 weeks. Last dose was 12/22/22.   He is underwent palliative systemic chemotherapy and immunotherapy with carboplatin for AUC of 5, paclitaxel 175 mg/m, and immunotherapy with Libtayo 350 mg IV every 3 weeks with Neulasta support.  He is status post 4 cycles.    He started maintenance immunotherapy with Libtayo  -Restaging CT CAP done 01/06/2023. This showed Slightly enlarged left hilar lymph node. Central obstruction of left upper lobe bronchi by left hilar mass with postobstructive bronchial plugging. Numerous new small ground-glass nodules throughout the suprahilar left upper lobe, consistent with postobstructive infection or inflammation. There is an unchanged mixed lytic and sclerotic osseous metastases of T8 and L4. No evidence of lymphadenopathy or soft tissue metastatic disease in the abdomen or pelvis. There is an unchanged intermediate attenuation lesion arising from the posterior inferior pole of the left kidney measuring 0.7 cm, possibly hemorrhagic or proteinaceous cyst although solid mass not excluded. Continued attention on follow-up. -Today is cycle 7 day 1 Cemiplimab.     Plan: Labs reviewed  -CBC showing WBC 3.1; Hgb 10.6; Hct 33.1; Plt 193; Anc 1.7 -CMP -  K 3.6; glucose 101; BUN 13; Creatinine 1.03; eGFR > 60; Ca 8.7; LFTs normal.   -Restaging CT CAP reviewed with patient.  Showing large hilar lymph node potentially obstructing the left upper lobe bronchi.  There are multiple groundglass nodules indicating probable infection versus inflammation.  There was no evidence of metastases in the abdomen or pelvis.  The CT appeared mostly stable. -Added Levaquin 500 mg Albert Hardy for next 7 days.  Advised patient to take with food and to add probiotic such as yogurt to keep good bacteria present in the gut.  Will help to prevent antibiotic associated diarrhea.  We also discussed rare, but severe side effects of Levaquin which includes tendon/muscle pain and rupture.  He understands if he experiences severe pain, he should stop taking the medication and contact the office immediately. Labs are suitable for treatment today.  Proceed with cycle 7 day 1 Cemiplimab. Labs, treatment, and follow-up as scheduled.  The patient understands the plans discussed today and is in agreement with them.  He knows to contact our office if he develops concerns prior to his next appointment.  I provided 25 minutes of face-to-face time during this encounter and > 50% was spent counseling as documented under my assessment and plan.    Carlean Jews, NP  [facility] - A DEPT OF [facility] [address] [place] [address] Dept: [phone] Dept Fax: [phone]   No orders of the defined types were placed in this encounter.     CHIEF COMPLAINT:  CC: Squamous cell carcinoma of lung, stage IV, left  Current Treatment:  1) Systemic chemotherapy with carboplatin for AUC of 5, paclitaxel 175 Mg/M2 and Libtayo (Cempilimab) 350 Mg IV every 3 weeks with Neulasta support for 4 cycles followed by maintenance treatment with Libtayo (Cempilimab) 350 Mg IV every 3 weeks.  He is status post 6 cycles of maintenance.   2) Rivka Barbara every 6 weeks  INTERVAL HISTORY:  Albert Hardy is here today for repeat clinical assessment.  He last saw [place], [place], on 12/22/2022.  He did have restaging CT CAP on 01/06/2023. Thre is evidence of obstruction related to infection vs. Inflammation of the left upper lung lobe from slight enlargement of left hilar lymph node.  There are also numerous, groundglass nodules throughout the suprahilar left upper lobe of the lungs.  There is unchanged osseous metastases of T8 and L4.  There is no evidence of lymphadenopathy or soft tissue metastases in the abdomen or pelvis.  There is an unchanged intermediate attenuation in the left kidney measuring 0.7 cm.  The patient reports some fatigue.  He also has cough which is sometimes productive of yellow-tinged sputum.  He denies fevers, chills, night sweats, or other signs of infection.  He denies chest pain, chest pressure, or shortness of breath. He denies headaches or visual disturbances. He denies abdominal pain, nausea, vomiting, or changes in bowel or bladder habits.   His appetite is good. His weight has decreased 2 pounds over last 2 weeks .  I have reviewed the past medical history, past surgical history, social history and family history with the patient and they are unchanged from previous note.  ALLERGIES:  is allergic to lipitor [atorvastatin] and pravastatin.  MEDICATIONS:  Current Outpatient Medications  Medication Sig Dispense Refill   albuterol (VENTOLIN HFA) 108 (90 Base) MCG/ACT inhaler Inhale 2 puffs into the lungs every 6 (six) hours as needed for wheezing or shortness of breath. 8 g 6   Ascorbic Acid (VITAMIN C PO) Take 1 tablet by mouth Albert Hardy.     Cholecalciferol (VITAMIN D3) 1000 UNITS CAPS Take 1,000 Units by mouth Albert Hardy.     Coenzyme Q10 (CO Q 10 PO) Take 1 tablet by mouth Albert Hardy.     Cyanocobalamin (VITAMIN B-12 PO) Take 1 tablet by mouth Albert Hardy.     cyclobenzaprine (FLEXERIL) 5 MG tablet Take 1 tablet (5 mg total) by mouth 3  (three) times Albert Hardy as needed for muscle spasms. 30 tablet 0   ELIQUIS 5 MG TABS tablet Take 1 tablet by mouth twice Albert Hardy 60 tablet 0   ezetimibe (ZETIA) 10 MG tablet Take 1 tablet (10 mg total) by mouth Albert Hardy. 90 tablet 3   fluticasone (FLONASE) 50 MCG/ACT nasal spray Place 1-2 sprays into both nostrils Albert Hardy. 16 g 6   KRILL OIL PO Take 1 tablet by mouth Albert Hardy.     levofloxacin (LEVAQUIN) 500 MG tablet Take 1 tablet (500 mg total) by mouth Albert Hardy. 7 tablet 0   lisinopril (ZESTRIL) 10 MG tablet Take 1 tablet by mouth once Albert Hardy 30 tablet 0   MAGNESIUM PO Take 1 tablet by mouth Albert Hardy.     metoCLOPramide (REGLAN) 10 MG tablet Take 1 tablet (10 mg total) by mouth 4 (four) times Albert Hardy -  before meals and at bedtime. 56 tablet 0   morphine (MS CONTIN) 15 MG 12 hr tablet Take 1 tablet (15 mg total) by mouth every 12 (twelve) hours. 60 tablet 0   Multiple Vitamins-Minerals (CENTRUM SILVER PO) Take 1 tablet by mouth Albert Hardy.     Multiple Vitamins-Minerals (ZINC PO) Take  1 tablet by mouth Albert Hardy.     Naphazoline-Pheniramine (OPCON-A) 0.027-0.315 % SOLN Place 1 drop into both eyes Albert Hardy as needed (redness).     nitroGLYCERIN (NITROSTAT) 0.4 MG SL tablet Place 1 tablet (0.4 mg total) under the tongue every 5 (five) minutes as needed for chest pain. 25 tablet 1   omeprazole (PRILOSEC) 20 MG capsule Take 1 capsule (20 mg total) by mouth Albert Hardy. 90 capsule 1   oxyCODONE (OXY IR/ROXICODONE) 5 MG immediate release tablet Take 1 tablet (5 mg total) by mouth every 4 (four) hours as needed for severe pain (pain score 7-10). 90 tablet 0   potassium chloride SA (KLOR-CON M) 20 MEQ tablet Take 1 tablet (20 mEq total) by mouth Albert Hardy. 6 tablet 0   prochlorperazine (COMPAZINE) 10 MG tablet Take 1 tablet (10 mg total) by mouth every 6 (six) hours as needed for nausea or vomiting. 30 tablet 0   rosuvastatin (CRESTOR) 5 MG tablet Take 1 tablet (5 mg total) by mouth at bedtime. 90 tablet 1   TURMERIC PO Take 1 tablet by mouth 3  (three) times a week.     umeclidinium-vilanterol (ANORO ELLIPTA) 62.5-25 MCG/ACT AEPB Inhale 1 puff into the lungs Albert Hardy. 180 each 3   metoprolol tartrate (LOPRESSOR) 25 MG tablet Take 1 tablet (25 mg total) by mouth once for 1 dose. Please take this medication 2 hours before CT 1 tablet 0   No current facility-administered medications for this visit.    HISTORY OF PRESENT ILLNESS:   Oncology History  Squamous cell carcinoma of lung, stage IV, left (HCC)  05/29/2022 Initial Diagnosis   Squamous cell carcinoma of lung, stage IV, left   05/29/2022 Cancer Staging   Staging form: Lung, AJCC 8th Edition - Clinical: Stage IVA Ileana Ladd, cN2, cM1b) - Signed by Si Gaul, MD on 05/29/2022   06/17/2022 - 08/20/2022 Chemotherapy   Patient is on Treatment Plan : LUNG Carboplatin + Paclitaxel q21d Dose Reduction     09/08/2022 -  Chemotherapy   Patient is on Treatment Plan : LUNG NSCLC Cemiplimab q21d     01/06/2023 Imaging   CT Chest abdomen and pelvis with contrast IMPRESSION: 1. Slightly enlarged left hilar lymph node. Central obstruction of left upper lobe bronchi by left hilar mass with postobstructive bronchial plugging. Numerous new small ground-glass nodules throughout the suprahilar left upper lobe, consistent with postobstructive infection or inflammation. 2. 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 3. No evidence of lymphadenopathy or soft tissue metastatic disease in the abdomen or pelvis. 4. Unchanged intermediate attenuation lesion arising from the posterior inferior pole of the left kidney measuring 0.7 cm, possibly hemorrhagic or proteinaceous cyst although solid mass not excluded. Continued attention on follow-up. 5. Hepatic steatosis. 6. Small volume free fluid in the low pelvis, similar to prior.       REVIEW OF SYSTEMS:   Constitutional: Denies fevers, chills or abnormal weight loss.  Reports some fatigue. Eyes: Denies blurriness of  vision Ears, nose, mouth, throat, and face: Denies mucositis or sore throat Respiratory: Denies dyspnea or wheezes.  He does have a cough, sometimes productive of yellowish sputum. Cardiovascular: Denies palpitation, chest discomfort or lower extremity swelling Gastrointestinal:  Denies nausea, heartburn or change in bowel habits Skin: Denies abnormal skin rashes Lymphatics: Denies new lymphadenopathy or easy bruising Neurological:Denies numbness, tingling or new weaknesses Behavioral/Psych: Mood is stable, no new changes  All other systems were reviewed with the patient and are negative.   VITALS:  Today's Vitals   01/12/23 0833  BP: (!) 160/73  Pulse: 73  Resp: 17  Temp: 98.3 F (36.8 C)  TempSrc: Temporal  SpO2: 100%  Weight: 141 lb 14.4 oz (64.4 kg)  PainSc: 0-No pain   Body mass index is 20.95 kg/m.   Wt Readings from Last 3 Encounters:  01/12/23 141 lb 14.4 oz (64.4 kg)  01/05/23 143 lb 9.6 oz (65.1 kg)  12/22/22 139 lb 8 oz (63.3 kg)    Body mass index is 20.95 kg/m.  Performance status (ECOG): 1 - Symptomatic but completely ambulatory  PHYSICAL EXAM:   GENERAL:alert, no distress and comfortable SKIN: skin color, texture, turgor are normal, no rashes or significant lesions EYES: normal, Conjunctiva are pink and non-injected, sclera clear OROPHARYNX:no exudate, no erythema and lips, buccal mucosa, and tongue normal  NECK: supple, thyroid normal size, non-tender, without nodularity LYMPH:  no palpable lymphadenopathy in the cervical, axillary or inguinal LUNGS: clear to auscultation and percussion with normal breathing effort HEART: regular rate & rhythm no lower extremity edema.  Soft, blowing systolic murmur appreciated today. ABDOMEN:abdomen soft, non-tender and normal bowel sounds Musculoskeletal:no cyanosis of digits and no clubbing  NEURO: alert & oriented x 3 with fluent speech, no focal motor/sensory deficits  LABORATORY DATA:  I have reviewed the  data as listed    Component Value Date/Time   NA 144 01/12/2023 0815   NA 146 (H) 01/05/2023 1114   K 3.6 01/12/2023 0815   CL 108 01/12/2023 0815   CO2 29 01/12/2023 0815   GLUCOSE 101 (H) 01/12/2023 0815   BUN 13 01/12/2023 0815   BUN 11 01/05/2023 1114   CREATININE 1.03 01/12/2023 0815   CREATININE 1.07 08/27/2014 1511   CALCIUM 8.7 (L) 01/12/2023 0815   PROT 6.7 01/12/2023 0815   PROT 7.1 10/21/2021 1038   ALBUMIN 3.8 01/12/2023 0815   ALBUMIN 4.4 10/21/2021 1038   AST 26 01/12/2023 0815   ALT 38 01/12/2023 0815   ALKPHOS 59 01/12/2023 0815   BILITOT 0.3 01/12/2023 0815   GFRNONAA >60 01/12/2023 0815   GFRNONAA 76 08/27/2014 1511   GFRAA 75 10/25/2019 1456   GFRAA 87 08/27/2014 1511    Lab Results  Component Value Date   WBC 3.1 (L) 01/12/2023   NEUTROABS 1.7 01/12/2023   HGB 10.6 (L) 01/12/2023   HCT 33.1 (L) 01/12/2023   MCV 92.7 01/12/2023   PLT 193 01/12/2023     RADIOGRAPHIC STUDIES: ICT CHEST ABDOMEN PELVIS W CONTRAST  Result Date: 01/10/2023 CLINICAL DATA:  Metastatic lung cancer restaging * Tracking Code: BO * EXAM: CT CHEST, ABDOMEN, AND PELVIS WITH CONTRAST TECHNIQUE: Multidetector CT imaging of the chest, abdomen and pelvis was performed following the standard protocol during bolus administration of intravenous contrast. RADIATION DOSE REDUCTION: This exam was performed according to the departmental dose-optimization program which includes automated exposure control, adjustment of the mA and/or kV according to patient size and/or use of iterative reconstruction technique. CONTRAST:  OMNIPAQUE IOHEXOL 300 MG/ML  SOLN COMPARISON:  11/03/2022 FINDINGS: CT CHEST FINDINGS Cardiovascular: Aortic atherosclerosis. Mild cardiomegaly. Similar small pericardial effusion. Mediastinum/Nodes: Slightly enlarged left hilar lymph node measuring 2.3 x 1.8 cm, previously 2.2 x 1.5 cm (series 2, image 61). Thyroid gland, trachea, and esophagus demonstrate no significant  findings. Lungs/Pleura: Central obstruction of left upper lobe bronchi by left hilar mass with postobstructive bronchial plugging. Numerous new small ground-glass nodules throughout the suprahilar left upper lobe (series 6, image 67). Mild diffuse bilateral bronchial wall  thickening. No pleural effusion or pneumothorax. Musculoskeletal: No chest wall abnormality. No acute osseous findings. CT ABDOMEN PELVIS FINDINGS Hepatobiliary: No solid liver abnormality is seen. Hepatic steatosis. No gallstones, gallbladder wall thickening, or biliary dilatation. Pancreas: Unremarkable. No pancreatic ductal dilatation or surrounding inflammatory changes. Spleen: Normal in size without significant abnormality. Adrenals/Urinary Tract: Adrenal glands are unremarkable. Unchanged intermediate attenuation lesion arising from the posterior inferior pole of the left kidney measuring 0.7 cm (series 2, image 77). Diffuse thickening of the urinary bladder wall, likely secondary to chronic outlet obstruction. Stomach/Bowel: Stomach is within normal limits. Appendix appears normal. No evidence of bowel wall thickening, distention, or inflammatory changes. Vascular/Lymphatic: Aortic atherosclerosis. No enlarged abdominal or pelvic lymph nodes. Reproductive: Mild prostatomegaly. Other: No abdominal wall hernia or abnormality. Small volume free fluid in the low pelvis, similar to prior. Musculoskeletal: No acute osseous findings. Unchanged mixed lytic and sclerotic osseous metastases of T8 and L4 (series 5, image 97). IMPRESSION: 1. Slightly enlarged left hilar lymph node. Central obstruction of left upper lobe bronchi by left hilar mass with postobstructive bronchial plugging. Numerous new small ground-glass nodules throughout the suprahilar left upper lobe, consistent with postobstructive infection or inflammation. 2. 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 3. No evidence of lymphadenopathy or soft tissue  metastatic disease in the abdomen or pelvis. 4. Unchanged intermediate attenuation lesion arising from the posterior inferior pole of the left kidney measuring 0.7 cm, possibly hemorrhagic or proteinaceous cyst although solid mass not excluded. Continued attention on follow-up. 5. Hepatic steatosis. 6. Small volume free fluid in the low pelvis, similar to prior. Aortic Atherosclerosis (ICD10-I70.0). Electronically Signed   By: Jearld Lesch M.D.   On: 01/10/2023 16:16

## 2023-01-10 NOTE — Assessment & Plan Note (Addendum)
(  T2a, N2, M1 B non-small cell lung cancer, squamous cell carcinoma.  He presented with a left upper lobe lung mass in addition to AP window lymphadenopathy and suspicious for metastases to T8 and L4 vertebrae as well as right retroperitoneal metastatic nodule.  He was diagnosed in April 2024.   His molecular studies by [employer] showed no actionable mutation.  His PD-L1 expression is 70%.  He underwent palliative radiation to the metastatic bone lesions.  He also receives [place] every 6 weeks. Last dose was 12/22/22.   He is underwent palliative systemic chemotherapy and immunotherapy with carboplatin for AUC of 5, paclitaxel 175 mg/m, and immunotherapy with Libtayo 350 mg IV every 3 weeks with Neulasta support.  He is status post 4 cycles.    He started maintenance immunotherapy with Libtayo  -Today is cycle 7 day 1 Cemiplimab. Restaging CT CAP done 01/06/2023.

## 2023-01-12 ENCOUNTER — Encounter: Payer: Self-pay | Admitting: Nurse Practitioner

## 2023-01-12 ENCOUNTER — Inpatient Hospital Stay (HOSPITAL_BASED_OUTPATIENT_CLINIC_OR_DEPARTMENT_OTHER): Payer: Medicare Other | Admitting: Nurse Practitioner

## 2023-01-12 ENCOUNTER — Inpatient Hospital Stay: Payer: Medicare Other | Attending: Internal Medicine

## 2023-01-12 ENCOUNTER — Inpatient Hospital Stay (HOSPITAL_BASED_OUTPATIENT_CLINIC_OR_DEPARTMENT_OTHER): Payer: Medicare Other | Attending: Internal Medicine | Admitting: Nurse Practitioner

## 2023-01-12 ENCOUNTER — Inpatient Hospital Stay: Payer: Medicare Other

## 2023-01-12 VITALS — BP 157/53 | HR 73 | Temp 98.3°F | Resp 17 | Wt 141.9 lb

## 2023-01-12 VITALS — BP 157/53

## 2023-01-12 DIAGNOSIS — Z79620 Long term (current) use of immunosuppressive biologic: Secondary | ICD-10-CM | POA: Insufficient documentation

## 2023-01-12 DIAGNOSIS — C7951 Secondary malignant neoplasm of bone: Secondary | ICD-10-CM | POA: Insufficient documentation

## 2023-01-12 DIAGNOSIS — Z5112 Encounter for antineoplastic immunotherapy: Secondary | ICD-10-CM | POA: Insufficient documentation

## 2023-01-12 DIAGNOSIS — G893 Neoplasm related pain (acute) (chronic): Secondary | ICD-10-CM

## 2023-01-12 DIAGNOSIS — C3492 Malignant neoplasm of unspecified part of left bronchus or lung: Secondary | ICD-10-CM

## 2023-01-12 DIAGNOSIS — Z87891 Personal history of nicotine dependence: Secondary | ICD-10-CM | POA: Diagnosis not present

## 2023-01-12 DIAGNOSIS — Z79899 Other long term (current) drug therapy: Secondary | ICD-10-CM | POA: Diagnosis not present

## 2023-01-12 DIAGNOSIS — K5903 Drug induced constipation: Secondary | ICD-10-CM

## 2023-01-12 DIAGNOSIS — Z515 Encounter for palliative care: Secondary | ICD-10-CM | POA: Diagnosis not present

## 2023-01-12 DIAGNOSIS — C3412 Malignant neoplasm of upper lobe, left bronchus or lung: Secondary | ICD-10-CM | POA: Insufficient documentation

## 2023-01-12 LAB — CBC WITH DIFFERENTIAL (CANCER CENTER ONLY)
Abs Immature Granulocytes: 0.01 10*3/uL (ref 0.00–0.07)
Basophils Absolute: 0 10*3/uL (ref 0.0–0.1)
Basophils Relative: 0 %
Eosinophils Absolute: 0 10*3/uL (ref 0.0–0.5)
Eosinophils Relative: 1 %
HCT: 33.1 % — ABNORMAL LOW (ref 39.0–52.0)
Hemoglobin: 10.6 g/dL — ABNORMAL LOW (ref 13.0–17.0)
Immature Granulocytes: 0 %
Lymphocytes Relative: 28 %
Lymphs Abs: 0.8 10*3/uL (ref 0.7–4.0)
MCH: 29.7 pg (ref 26.0–34.0)
MCHC: 32 g/dL (ref 30.0–36.0)
MCV: 92.7 fL (ref 80.0–100.0)
Monocytes Absolute: 0.4 10*3/uL (ref 0.1–1.0)
Monocytes Relative: 14 %
Neutro Abs: 1.7 10*3/uL (ref 1.7–7.7)
Neutrophils Relative %: 57 %
Platelet Count: 193 10*3/uL (ref 150–400)
RBC: 3.57 MIL/uL — ABNORMAL LOW (ref 4.22–5.81)
RDW: 13.9 % (ref 11.5–15.5)
WBC Count: 3.1 10*3/uL — ABNORMAL LOW (ref 4.0–10.5)
nRBC: 0 % (ref 0.0–0.2)

## 2023-01-12 LAB — CMP (CANCER CENTER ONLY)
ALT: 38 U/L (ref 0–44)
AST: 26 U/L (ref 15–41)
Albumin: 3.8 g/dL (ref 3.5–5.0)
Alkaline Phosphatase: 59 U/L (ref 38–126)
Anion gap: 7 (ref 5–15)
BUN: 13 mg/dL (ref 8–23)
CO2: 29 mmol/L (ref 22–32)
Calcium: 8.7 mg/dL — ABNORMAL LOW (ref 8.9–10.3)
Chloride: 108 mmol/L (ref 98–111)
Creatinine: 1.03 mg/dL (ref 0.61–1.24)
GFR, Estimated: >60 mL/min (ref >60)
Glucose, Bld: 101 mg/dL — ABNORMAL HIGH (ref 70–99)
Potassium: 3.6 mmol/L (ref 3.5–5.1)
Sodium: 144 mmol/L (ref 135–145)
Total Bilirubin: 0.3 mg/dL (ref <1.2)
Total Protein: 6.7 g/dL (ref 6.5–8.1)

## 2023-01-12 LAB — TSH: TSH: 1.109 u[IU]/mL (ref 0.350–4.500)

## 2023-01-12 MED ORDER — MORPHINE SULFATE ER 15 MG PO TBCR: 15.0000 mg | EXTENDED_RELEASE_TABLET | Freq: Two times a day (BID) | ORAL | 0 refills | Status: DC

## 2023-01-12 MED ORDER — SODIUM CHLORIDE 0.9 % IV SOLN: Freq: Once | INTRAVENOUS | Status: AC

## 2023-01-12 MED ORDER — LEVOFLOXACIN 500 MG PO TABS: 500.0000 mg | ORAL_TABLET | Freq: Every day | ORAL | 0 refills | Status: DC

## 2023-01-12 MED ORDER — SODIUM CHLORIDE 0.9 % IV SOLN
350.0000 mg | Freq: Once | INTRAVENOUS | Status: AC
  Administered 2023-01-12: 350 mg via INTRAVENOUS
  Filled 2023-01-12: qty 7

## 2023-01-12 NOTE — Patient Instructions (Signed)
[facility] - A DEPT OF [facility]  Discharge Instructions: Thank you for choosing [facility] to provide your oncology and hematology care.   If you have a lab appointment with the Cancer Center, please go directly to the Cancer Center and check in at the registration area.   Wear comfortable clothing and clothing appropriate for easy access to any Portacath or PICC line.   We strive to give you quality time with your provider. You may need to reschedule your appointment if you arrive late (15 or more minutes).  Arriving late affects you and other patients whose appointments are after yours.  Also, if you miss three or more appointments without notifying the office, you may be dismissed from the clinic at the provider's discretion.      For prescription refill requests, have your pharmacy contact our office and allow 72 hours for refills to be completed.    Today you received the following chemotherapy and/or immunotherapy agent: Cemiplimab (Libtayo)      To help prevent nausea and vomiting after your treatment, we encourage you to take your nausea medication as directed.  BELOW ARE SYMPTOMS THAT SHOULD BE REPORTED IMMEDIATELY: *FEVER GREATER THAN 100.4 F (38 C) OR HIGHER *CHILLS OR SWEATING *NAUSEA AND VOMITING THAT IS NOT CONTROLLED WITH YOUR NAUSEA MEDICATION *UNUSUAL SHORTNESS OF BREATH *UNUSUAL BRUISING OR BLEEDING *URINARY PROBLEMS (pain or burning when urinating, or frequent urination) *BOWEL PROBLEMS (unusual diarrhea, constipation, pain near the anus) TENDERNESS IN MOUTH AND THROAT WITH OR WITHOUT PRESENCE OF ULCERS (sore throat, sores in mouth, or a toothache) UNUSUAL RASH, SWELLING OR PAIN  UNUSUAL VAGINAL DISCHARGE OR ITCHING   Items with * indicate a potential emergency and should be followed up as soon as possible or go to the Emergency Department if any problems should occur.  Please show the CHEMOTHERAPY ALERT CARD or  IMMUNOTHERAPY ALERT CARD at check-in to the Emergency Department and triage nurse.  Should you have questions after your visit or need to cancel or reschedule your appointment, please contact [facility] - A DEPT OF [facility]  Dept: [phone]  and follow the prompts.  Office hours are 8:00 a.m. to 4:30 p.m. Monday - Friday. Please note that voicemails left after 4:00 p.m. may not be returned until the following business day.  We are closed weekends and major holidays. You have access to a nurse at all times for urgent questions. Please call the main number to the clinic Dept: [phone] and follow the prompts.   For any non-urgent questions, you may also contact your provider using MyChart. We now offer e-Visits for anyone 75 and older to request care online for non-urgent symptoms. For details visit [email or web address].   Also download the MyChart app! Go to the app store, search "MyChart", open the app, select [facility], and log in with your MyChart username and password.  Cemiplimab Injection What is this medication? CEMIPLIMAB (se MIP li mab) treats skin cancer and lung cancer. It works by helping your immune system slow or stop the spread of cancer cells. It is a monoclonal antibody. This medicine may be used for other purposes; ask your health care provider or pharmacist if you have questions. COMMON BRAND NAME(S): LIBTAYO What should I tell my care team before I take this medication? They need to know if you have any of these conditions: Allogeneic stem cell transplant (uses someone else's stem  cells) Autoimmune diseases, such as Crohn disease, ulcerative colitis, lupus History of chest radiation Nervous system problems, such as Guillain-Barre syndrome or myasthenia gravis Organ transplant An unusual or allergic reaction to cemiplimab, other medications, foods, dyes, or preservatives Pregnant or trying to get pregnant Breastfeeding How  should I use this medication? This medication is infused into a vein. It is given by your care team in a hospital or clinic setting. A special MedGuide will be given to you before each treatment. Be sure to read this information carefully each time. Talk to your care team about the use of this medication in children. Special care may be needed. Overdosage: If you think you have taken too much of this medicine contact a poison control center or emergency room at once. NOTE: This medicine is only for you. Do not share this medicine with others. What if I miss a dose? Keep appointments for follow-up doses. It is important not to miss your dose. Call your care team if you are unable to keep an appointment. What may interact with this medication? Interactions have not been studied. This list may not describe all possible interactions. Give your health care provider a list of all the medicines, herbs, non-prescription drugs, or dietary supplements you use. Also tell them if you smoke, drink alcohol, or use illegal drugs. Some items may interact with your medicine. What should I watch for while using this medication? Visit your care team for regular checks on your progress. It may be some time before you see the benefit from this medication. You may need blood work done while taking this medication. This medication may cause serious skin reactions. They can happen weeks to months after starting the medication. Contact your care team right away if you notice fevers or flu-like symptoms with a rash. The rash may be red or purple and then turn into blisters or peeling of the skin. You may also notice a red rash with swelling of the face, lips, or lymph nodes in your neck or under your arms. Tell your care team right away if you have any change in your eyesight. Talk to your care team if you may be pregnant. You will need a negative pregnancy test before starting this medication. Contraception is recommended  while taking this medication and for 4 months after the last dose. Your care team can help you find the option that works for you. Do not breastfeed while taking this medication and for at least 4 months after the last dose. What side effects may I notice from receiving this medication? Side effects that you should report to your care team as soon as possible: Allergic reactions--skin rash, itching, hives, swelling of the face, lips, tongue, or throat Dry cough, shortness of breath or trouble breathing Eye pain, redness, irritation, or discharge with blurry or decreased vision Heart muscle inflammation--unusual weakness or fatigue, shortness of breath, chest pain, fast or irregular heartbeat, dizziness, swelling of the ankles, feet, or hands Hormone gland problems--headache, sensitivity to light, unusual weakness or fatigue, dizziness, fast or irregular heartbeat, increased sensitivity to cold or heat, excessive sweating, constipation, hair loss, increased thirst or amount of urine, tremors or shaking, irritability Infusion reactions--chest pain, shortness of breath or trouble breathing, feeling faint or lightheaded Kidney injury (glomerulonephritis)--decrease in the amount of urine, red or dark brown urine, foamy or bubbly urine, swelling of the ankles, hands, or feet Liver injury--right upper belly pain, loss of appetite, nausea, light-colored stool, dark yellow or brown urine, yellowing  skin or eyes, unusual weakness or fatigue Pain, tingling, or numbness in the hands or feet, muscle weakness, change in vision, confusion or trouble speaking, loss of balance or coordination, trouble walking, seizures Rash, fever, and swollen lymph nodes Redness, blistering, peeling, or loosening of the skin, including inside the mouth Sudden or severe stomach pain, bloody diarrhea, fever, nausea, vomiting Side effects that usually do not require medical attention (report these to your care team if they continue or  are bothersome): Bone, joint, or muscle pain Diarrhea Fatigue Loss of appetite Nausea Skin rash This list may not describe all possible side effects. Call your doctor for medical advice about side effects. You may report side effects to FDA at 1-800-FDA-1088. Where should I keep my medication? This medication is given in a hospital or clinic and will not be stored at home. NOTE: This sheet is a summary. It may not cover all possible information. If you have questions about this medicine, talk to your doctor, pharmacist, or health care provider.  2024 Elsevier/Gold Standard (2022-03-16 00:00:00) Denosumab Injection (Oncology) What is this medication? DENOSUMAB (den oh SUE mab) prevents weakened bones caused by cancer. It may also be used to treat noncancerous bone tumors that cannot be removed by surgery. It can also be used to treat high calcium levels in the blood caused by cancer. It works by blocking a protein that causes bones to break down quickly. This slows down the release of calcium from bones, which lowers calcium levels in your blood. It also makes your bones stronger and less likely to break (fracture). This medicine may be used for other purposes; ask your health care provider or pharmacist if you have questions. COMMON BRAND NAME(S): XGEVA What should I tell my care team before I take this medication? They need to know if you have any of these conditions: Dental disease Having surgery or tooth extraction Infection Kidney disease Low levels of calcium or vitamin D in the blood Malnutrition On hemodialysis Skin conditions or sensitivity Thyroid or parathyroid disease An unusual reaction to denosumab, other medications, foods, dyes, or preservatives Pregnant or trying to get pregnant Breast-feeding How should I use this medication? This medication is for injection under the skin. It is given by your care team in a hospital or clinic setting. A special MedGuide will be  given to you before each treatment. Be sure to read this information carefully each time. Talk to your care team about the use of this medication in children. While it may be prescribed for children as young as 13 years for selected conditions, precautions do apply. Overdosage: If you think you have taken too much of this medicine contact a poison control center or emergency room at once. NOTE: This medicine is only for you. Do not share this medicine with others. What if I miss a dose? Keep appointments for follow-up doses. It is important not to miss your dose. Call your care team if you are unable to keep an appointment. What may interact with this medication? Do not take this medication with any of the following: Other medications containing denosumab This medication may also interact with the following: Medications that lower your chance of fighting infection Steroid medications, such as prednisone or cortisone This list may not describe all possible interactions. Give your health care provider a list of all the medicines, herbs, non-prescription drugs, or dietary supplements you use. Also tell them if you smoke, drink alcohol, or use illegal drugs. Some items may interact with your  medicine. What should I watch for while using this medication? Your condition will be monitored carefully while you are receiving this medication. You may need blood work while taking this medication. This medication may increase your risk of getting an infection. Call your care team for advice if you get a fever, chills, sore throat, or other symptoms of a cold or flu. Do not treat yourself. Try to avoid being around people who are sick. You should make sure you get enough calcium and vitamin D while you are taking this medication, unless your care team tells you not to. Discuss the foods you eat and the vitamins you take with your care team. Some people who take this medication have severe bone, joint, or muscle  pain. This medication may also increase your risk for jaw problems or a broken thigh bone. Tell your care team right away if you have severe pain in your jaw, bones, joints, or muscles. Tell your care team if you have any pain that does not go away or that gets worse. Talk to your care team if you may be pregnant. Serious birth defects can occur if you take this medication during pregnancy and for 5 months after the last dose. You will need a negative pregnancy test before starting this medication. Contraception is recommended while taking this medication and for 5 months after the last dose. Your care team can help you find the option that works for you. What side effects may I notice from receiving this medication? Side effects that you should report to your care team as soon as possible: Allergic reactions--skin rash, itching, hives, swelling of the face, lips, tongue, or throat Bone, joint, or muscle pain Low calcium level--muscle pain or cramps, confusion, tingling, or numbness in the hands or feet Osteonecrosis of the jaw--pain, swelling, or redness in the mouth, numbness of the jaw, poor healing after dental work, unusual discharge from the mouth, visible bones in the mouth Side effects that usually do not require medical attention (report to your care team if they continue or are bothersome): Cough Diarrhea Fatigue Headache Nausea This list may not describe all possible side effects. Call your doctor for medical advice about side effects. You may report side effects to FDA at 1-800-FDA-1088. Where should I keep my medication? This medication is given in a hospital or clinic. It will not be stored at home. NOTE: This sheet is a summary. It may not cover all possible information. If you have questions about this medicine, talk to your doctor, pharmacist, or health care provider.  2024 Elsevier/Gold Standard (2021-06-18 00:00:00) Please take over the counter Calcium tablets as  directed.

## 2023-01-13 ENCOUNTER — Ambulatory Visit: Payer: Medicare Other | Admitting: Family Medicine

## 2023-01-13 VITALS — BP 166/78 | HR 62 | Temp 97.7°F | Ht 69.0 in | Wt 143.6 lb

## 2023-01-13 DIAGNOSIS — I10 Essential (primary) hypertension: Secondary | ICD-10-CM | POA: Diagnosis not present

## 2023-01-13 DIAGNOSIS — C3492 Malignant neoplasm of unspecified part of left bronchus or lung: Secondary | ICD-10-CM

## 2023-01-13 LAB — T4: T4, Total: 7.6 ug/dL (ref 4.5–12.0)

## 2023-01-13 MED ORDER — LISINOPRIL-HYDROCHLOROTHIAZIDE 10-12.5 MG PO TABS: 1.0000 | ORAL_TABLET | Freq: Every day | ORAL | 1 refills | Status: DC

## 2023-01-13 NOTE — Progress Notes (Unsigned)
Subjective:  Patient ID: Albert Council., male    DOB: Dec 27, 1955  Age: 67 y.o. MRN: [id number]  CC:  Chief Complaint  Patient presents with   Medical Management of Chronic Issues    Pt is doing okay, notes his BP has been staying high recently, pt notes no new stress to cause this     HPI Albert Hardy. presents for   Hypertension: With history of hypotension.  Overcontrolled hypertension discussed in August, chest discomfort at that time without acute findings seen on EKG.  We decreased his lisinopril dose and hydrochlorothiazide dosing in half to a total of 20/12.5 mg daily.  Was seen by cardiology, nitroglycerin given if needed.  Was advised in August by cardiology to hold his antihypertensive medication if his systolic was less than 115.  Was still having some low readings on his August 29 visit and had been off medications for a few days.  Printed lisinopril 10 mg if blood pressure started to increase, then option to restart previous combo if further increases.   Since last visit he has noticed some elevated readings - past few months. . Just taking lisinopril 10mg  every day.  Home readings: 150-160/78-80 No recent chest pains. Had been taking ntg intermittently (about 5 times) prior to recent cardiology eval 11/26 - now reserving for more significant symptoms. Plan for cardiac CTA.  On eliquis for prior PE. No new bleeding.  Eating drinking ok, no diarrhea.  BP Readings from Last 3 Encounters:  01/13/23 (!) 166/78  01/12/23 (!) 157/53  01/12/23 (!) 157/53   Lab Results  Component Value Date   CREATININE 1.03 01/12/2023    He is under the care of oncology with metastatic lung cancer.  Has received palliative systemic chemotherapy.  Office visit with oncology noted yesterday.  Restaging CT reviewed.  Large hilar lymph node potentially obstructing left upper lobe bronchi and multiple groundglass nodules indicating probable infection versus inflammation.  No evidence of  metastasis in abdomen or pelvis.  Levaquin 500 mg daily added for 7 days.  Palliative medicine team consult yesterday regarding goals of care and treatment as well.  He is on MS Contin with Oxy IR as needed for pain control, Flexeril as needed, senna and MiraLAX.  4 to 6-week follow-up planned. Just picked up abx.  No fever. No new cough. Eating and drinking ok.  Plans on flu vaccine and covid booster at pharmacy.   Lab Results  Component Value Date   CREATININE 1.03 01/12/2023   History Patient Active Problem List   Diagnosis Date Noted   Encounter for antineoplastic chemotherapy 08/18/2022   Encounter for antineoplastic immunotherapy 08/18/2022   Squamous cell carcinoma of lung, stage IV, left (HCC) 05/29/2022   Lung nodule 05/15/2022   Heart murmur 08/28/2021   Substance abuse (HCC) 08/28/2021   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   Bilateral sensorineural hearing loss 12/14/2018   Bilateral impacted cerumen 10/13/2018   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   Aortic stenosis 04/04/2018   Atherosclerosis of native artery of extremity with intermittent claudication (HCC) 09/08/2013   Former moderate cigarette smoker (10-19 per day) 05/20/2012   Peripheral vascular disease with claudication    Hyperlipidemia    Aortic regurgitation    GERD (gastroesophageal reflux disease)    Hypertension    Past Medical History:  Diagnosis Date   Aortic regurgitation    Aortic stenosis 04/04/2018   Atherosclerosis of native arteries of the extremities with intermittent claudication 09/08/2013   Bilateral  impacted cerumen 10/13/2018   Bilateral sensorineural hearing loss 12/14/2018   Dyspnea    Former moderate cigarette smoker (10-19 per day) 05/20/2012   Quit smoking Nov 2013 when hospitalized w/ HTN and chest pain(diagnosed w/ valvular heart disease)   GERD (gastroesophageal reflux disease)    Heart murmur    History of radiation therapy    Lumbar Spine, Thoracic Spine-  06/25/22-07/10/22- Dr. 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   Substance abuse [facility])    Past Surgical History:  Procedure Laterality Date   BRONCHIAL BIOPSY  05/26/2022   Procedure: BRONCHIAL BIOPSIES;  Surgeon: Josephine Igo, DO;  Location: [facility];  Service: Pulmonary;;   BRONCHIAL BRUSHINGS  05/26/2022   Procedure: BRONCHIAL BRUSHINGS;  Surgeon: Josephine Igo, DO;  Location: [facility];  Service: Pulmonary;;   NO PAST SURGERIES     VIDEO BRONCHOSCOPY  05/26/2022   Procedure: VIDEO BRONCHOSCOPY WITHOUT FLUORO;  Surgeon: Josephine Igo, DO;  Location: [facility]OS[facility];  Service: Pulmonary;;   Allergies  Allergen Reactions   Lipitor [Atorvastatin]     myalgia   Pravastatin Other (See Comments)    myalgia   Prior to Admission medications   Medication Sig Start Date End Date Taking? Authorizing Provider  albuterol (VENTOLIN HFA) 108 (90 Base) MCG/ACT inhaler Inhale 2 puffs into the lungs every 6 (six) hours as needed for wheezing or shortness of breath. 09/02/22  Yes [name], MD  Ascorbic Acid (VITAMIN C PO) Take 1 tablet by mouth daily.   Yes [provider]  Cholecalciferol (VITAMIN D3) 1000 UNITS CAPS Take 1,000 Units by mouth daily.   Yes [provider]  Coenzyme Q10 (CO Q 10 PO) Take 1 tablet by mouth daily.   Yes [provider]  Cyanocobalamin (VITAMIN B-12 PO) Take 1 tablet by mouth daily.   Yes [provider]  cyclobenzaprine (FLEXERIL) 5 MG tablet Take 1 tablet (5 mg total) by mouth 3 (three) times daily as needed for muscle spasms. 06/18/22  Yes Pickenpack-Cousar, Arty Baumgartner, NP  ELIQUIS 5 MG TABS tablet Take 1 tablet by mouth twice daily 12/21/22  Yes Heilingoetter, Cassandra L, PA-C  ezetimibe (ZETIA) 10 MG tablet Take 1 tablet (10 mg total) by mouth daily. 09/02/22   Yes [name], MD  fluticasone [facility]) 50 MCG/ACT nasal spray Place 1-2 sprays into both nostrils daily. 04/24/20  Yes [name], MD  KRILL OIL PO Take 1 tablet by mouth daily.   Yes [provider]  levofloxacin (LEVAQUIN) 500 MG tablet Take 1 tablet (500 mg total) by mouth daily. 01/12/23  Yes Carlean Jews, NP  lisinopril (ZESTRIL) 10 MG tablet Take 1 tablet by mouth once daily 12/30/22  Yes [name], MD  MAGNESIUM PO Take 1 tablet by mouth daily.   Yes [provider]  metoCLOPramide (REGLAN) 10 MG tablet Take 1 tablet (10 mg total) by mouth 4 (four) times daily -  before meals and at bedtime. 07/20/22  Yes Pickenpack-Cousar, Arty Baumgartner, NP  morphine (MS CONTIN) 15 MG 12 hr tablet Take 1 tablet (15 mg total) by mouth every 12 (twelve) hours. 01/12/23  Yes Pickenpack-Cousar, Arty Baumgartner, NP  Multiple Vitamins-Minerals (CENTRUM SILVER PO) Take 1 tablet by mouth daily.   Yes [provider]  Multiple Vitamins-Minerals (  ZINC PO) Take 1 tablet by mouth daily.   Yes [provider]  Naphazoline-Pheniramine (OPCON-A) 0.027-0.315 % SOLN Place 1 drop into both eyes daily as needed (redness).   Yes [provider]  nitroGLYCERIN (NITROSTAT) 0.4 MG SL tablet Place 1 tablet (0.4 mg total) under the tongue every 5 (five) minutes as needed for chest pain. 10/05/22  Yes Baldo Daub, MD  omeprazole (PRILOSEC) 20 MG capsule Take 1 capsule (20 mg total) by mouth daily. 09/02/22  Yes [name], MD  oxyCODONE (OXY IR/ROXICODONE) 5 MG immediate release tablet Take 1 tablet (5 mg total) by mouth every 4 (four) hours as needed for severe pain (pain score 7-10). 12/01/22  Yes Pickenpack-Cousar, Arty Baumgartner, NP  potassium chloride SA (KLOR-CON M) 20 MEQ tablet Take 1 tablet (20 mEq total) by mouth daily. 12/22/22  Yes Heilingoetter, Cassandra L, PA-C  prochlorperazine (COMPAZINE) 10 MG tablet Take 1 tablet (10 mg total) by mouth every 6 (six) hours  as needed for nausea or vomiting. 06/09/22  Yes Si Gaul, MD  rosuvastatin (CRESTOR) 5 MG tablet Take 1 tablet (5 mg total) by mouth at bedtime. 09/02/22  Yes [name], MD  TURMERIC PO Take 1 tablet by mouth 3 (three) times a week.   Yes [provider]  umeclidinium-vilanterol (ANORO ELLIPTA) 62.5-25 MCG/ACT AEPB Inhale 1 puff into the lungs daily. 09/16/22  Yes [name], MD  metoprolol tartrate (LOPRESSOR) 25 MG tablet Take 1 tablet (25 mg total) by mouth once for 1 dose. Please take this medication 2 hours before CT 01/05/23 01/05/23  Baldo Daub, MD   Social History   Socioeconomic History   Marital status: Married    Spouse name: [name]   Number of children: Not on file   Years of education: Not on file   Highest education level: Associate degree: occupational, Scientist, product/process development, or vocational program  Occupational History   Occupation: Recruitment consultant  Tobacco Use   Smoking status: Former    Current packs/day: 0.00    Average packs/day: 1 pack/day for 30.0 years (30.0 ttl pk-yrs)    Types: Cigarettes    Start date: 12/20/1981    Quit date: 12/21/2011    Years since quitting: 11.0   Smokeless tobacco: Never  Vaping Use   Vaping status: Every Day  Substance and Sexual Activity   Alcohol use: Yes    Comment: Ocassionaly.    Drug use: Yes    Frequency: 1.0 times per week    Types: Marijuana   Sexual activity: Yes    Birth control/protection: None  Other Topics Concern   Not on file  Social History Narrative   Married. Education: [employer]. Exercise:  "Somewhat".   Social Determinants of Health   Financial Resource Strain: Low Risk  (01/09/2023)   Overall Financial Resource Strain (CARDIA)    Difficulty of Paying Living Expenses: Not hard at all  Food Insecurity: No Food Insecurity (01/09/2023)   Hunger Vital Sign    Worried About Running Out of Food in the Last Year: Never true    Ran Out of Food in the Last Year: Never true   Transportation Needs: No Transportation Needs (01/09/2023)   PRAPARE - Administrator, Civil Service (Medical): No    Lack of Transportation (Non-Medical): No  Physical Activity: Inactive (01/09/2023)   Exercise Vital Sign    Days of Exercise per Week: 0 days    Minutes of Exercise per Session: 0 min  Stress:  No Stress Concern Present (01/09/2023)   [employer] of Occupational Health - Occupational Stress Questionnaire    Feeling of Stress : Not at all  Social Connections: Socially Integrated (01/09/2023)   Social Connection and Isolation Panel [NHANES]    Frequency of Communication with Friends and Family: Three times a week    Frequency of Social Gatherings with Friends and Family: Once a week    Attends Religious Services: More than 4 times per year    Active Member of [employer] or Organizations: Yes    Attends Engineer, structural: More than 4 times per year    Marital Status: Married  Catering manager Violence: Not At Risk (11/12/2022)   Humiliation, Afraid, Rape, and Kick questionnaire    Fear of Current or Ex-Partner: No    Emotionally Abused: No    Physically Abused: No    Sexually Abused: No    Review of Systems  Constitutional:  Negative for fatigue and unexpected weight change.  Eyes:  Negative for visual disturbance.  Respiratory:  Negative for cough, chest tightness and shortness of breath.   Cardiovascular:  Negative for chest pain, palpitations and leg swelling.  Gastrointestinal:  Negative for abdominal pain and blood in stool.  Neurological:  Negative for dizziness, light-headedness and headaches.  All other systems reviewed and are negative.   Objective:   Vitals:   01/13/23 1020 01/13/23 1035  BP: (!) 164/80 (!) 166/78  Pulse: 62   Temp: 97.7 F (36.5 C)   TempSrc: Temporal   SpO2: 100%   Weight: 143 lb 9.6 oz (65.1 kg)   Height: 5\' 9"  (1.753 m)      Physical Exam Vitals reviewed.  Constitutional:      Appearance: He is  well-developed.  HENT:     Head: Normocephalic and atraumatic.  Neck:     Vascular: No carotid bruit or JVD.  Cardiovascular:     Rate and Rhythm: Normal rate and regular rhythm.     Heart sounds: Normal heart sounds. No murmur heard. Pulmonary:     Effort: Pulmonary effort is normal.     Breath sounds: Normal breath sounds. No rales.  Musculoskeletal:     Right lower leg: No edema.     Left lower leg: No edema.  Skin:    General: Skin is warm and dry.  Neurological:     Mental Status: He is alert and oriented to person, place, and time.  Psychiatric:        Mood and Affect: Mood normal.        Assessment & Plan:  Albert Hardy. is a 67 y.o. male . Essential hypertension - Plan: lisinopril-hydrochlorothiazide (ZESTORETIC) 10-12.5 MG tablet  Squamous cell carcinoma of lung, stage IV, left (HCC)  Blood pressure steadily increasing after prior low readings and adjustment in meds.  At this point we will add back in the hydrochlorothiazide 12.5 mg but continue on the lower dose of lisinopril at 10 mg with home monitoring and RTC precautions.  Recheck in 1 month.  Follow-up with oncology, palliative care as above, no specific needs identified at this time.   Meds ordered this encounter  Medications   lisinopril-hydrochlorothiazide (ZESTORETIC) 10-12.5 MG tablet    Sig: Take 1 tablet by mouth daily.    Dispense:  90 tablet    Refill:  1   Patient Instructions  We will add the hydrochlorothiazide to current dose of lisinopril for improved blood pressure control.  That is the same combination you  have taken previously just a slight lower dose.  Keep an eye on your blood pressures and those are not improving in the next 2 weeks, let me know.  Either way I would like to follow-up in 1 month and we can decide on any changes or blood work if needed at that time.  If any new symptoms or concerns prior, let me know.  Take care!      Signed,   Meredith Staggers, MD [facility], [facility] 01/13/23 12:22 PM

## 2023-01-13 NOTE — Patient Instructions (Signed)
We will add the hydrochlorothiazide to current dose of lisinopril for improved blood pressure control.  That is the same combination you have taken previously just a slight lower dose.  Keep an eye on your blood pressures and those are not improving in the next 2 weeks, let me know.  Either way I would like to follow-up in 1 month and we can decide on any changes or blood work if needed at that time.  If any new symptoms or concerns prior, let me know.  Take care!

## 2023-01-14 ENCOUNTER — Encounter: Payer: Self-pay | Admitting: Family Medicine

## 2023-01-27 ENCOUNTER — Other Ambulatory Visit: Payer: Self-pay | Admitting: Family Medicine

## 2023-01-27 DIAGNOSIS — I959 Hypotension, unspecified: Secondary | ICD-10-CM

## 2023-01-27 DIAGNOSIS — I10 Essential (primary) hypertension: Secondary | ICD-10-CM

## 2023-01-29 ENCOUNTER — Telehealth: Payer: Self-pay | Admitting: Family Medicine

## 2023-01-29 NOTE — Telephone Encounter (Signed)
Caller name: [facility]  On DPR?: NA  Call back number:   Provider they see: [name], MD  Reason for call: Recall foreign body found in medication 10mg  Lisinopril  No charge FYI   Placed in front bin

## 2023-02-02 ENCOUNTER — Inpatient Hospital Stay: Payer: Medicare Other

## 2023-02-02 ENCOUNTER — Inpatient Hospital Stay (HOSPITAL_BASED_OUTPATIENT_CLINIC_OR_DEPARTMENT_OTHER): Payer: Medicare Other | Admitting: Internal Medicine

## 2023-02-02 VITALS — BP 127/61 | HR 72 | Temp 98.2°F | Resp 17 | Ht 69.0 in | Wt 137.9 lb

## 2023-02-02 DIAGNOSIS — C3492 Malignant neoplasm of unspecified part of left bronchus or lung: Secondary | ICD-10-CM

## 2023-02-02 DIAGNOSIS — Z5112 Encounter for antineoplastic immunotherapy: Secondary | ICD-10-CM | POA: Diagnosis not present

## 2023-02-02 LAB — CBC WITH DIFFERENTIAL (CANCER CENTER ONLY)
Abs Immature Granulocytes: 0 10*3/uL (ref 0.00–0.07)
Basophils Absolute: 0 10*3/uL (ref 0.0–0.1)
Basophils Relative: 0 %
Eosinophils Absolute: 0.1 10*3/uL (ref 0.0–0.5)
Eosinophils Relative: 2 %
HCT: 37.3 % — ABNORMAL LOW (ref 39.0–52.0)
Hemoglobin: 12.3 g/dL — ABNORMAL LOW (ref 13.0–17.0)
Immature Granulocytes: 0 %
Lymphocytes Relative: 35 %
Lymphs Abs: 1.3 10*3/uL (ref 0.7–4.0)
MCH: 29.1 pg (ref 26.0–34.0)
MCHC: 33 g/dL (ref 30.0–36.0)
MCV: 88.2 fL (ref 80.0–100.0)
Monocytes Absolute: 0.4 10*3/uL (ref 0.1–1.0)
Monocytes Relative: 9 %
Neutro Abs: 2 10*3/uL (ref 1.7–7.7)
Neutrophils Relative %: 54 %
Platelet Count: 143 10*3/uL — ABNORMAL LOW (ref 150–400)
RBC: 4.23 MIL/uL (ref 4.22–5.81)
RDW: 13.6 % (ref 11.5–15.5)
WBC Count: 3.8 10*3/uL — ABNORMAL LOW (ref 4.0–10.5)
nRBC: 0 % (ref 0.0–0.2)

## 2023-02-02 LAB — CMP (CANCER CENTER ONLY)
ALT: 47 U/L — ABNORMAL HIGH (ref 0–44)
AST: 36 U/L (ref 15–41)
Albumin: 4.4 g/dL (ref 3.5–5.0)
Alkaline Phosphatase: 55 U/L (ref 38–126)
Anion gap: 10 (ref 5–15)
BUN: 21 mg/dL (ref 8–23)
CO2: 25 mmol/L (ref 22–32)
Calcium: 9.5 mg/dL (ref 8.9–10.3)
Chloride: 105 mmol/L (ref 98–111)
Creatinine: 1.07 mg/dL (ref 0.61–1.24)
GFR, Estimated: >60 mL/min (ref >60)
Glucose, Bld: 115 mg/dL — ABNORMAL HIGH (ref 70–99)
Potassium: 3.7 mmol/L (ref 3.5–5.1)
Sodium: 140 mmol/L (ref 135–145)
Total Bilirubin: 0.3 mg/dL (ref <1.2)
Total Protein: 8 g/dL (ref 6.5–8.1)

## 2023-02-02 LAB — TSH: TSH: 0.959 u[IU]/mL (ref 0.350–4.500)

## 2023-02-02 MED ORDER — SODIUM CHLORIDE 0.9 % IV SOLN: Freq: Once | INTRAVENOUS | Status: AC

## 2023-02-02 MED ORDER — SODIUM CHLORIDE 0.9 % IV SOLN
350.0000 mg | Freq: Once | INTRAVENOUS | Status: AC
  Administered 2023-02-02: 350 mg via INTRAVENOUS
  Filled 2023-02-02: qty 7

## 2023-02-02 MED ORDER — DENOSUMAB 120 MG/1.7ML ~~LOC~~ SOLN
120.0000 mg | Freq: Once | SUBCUTANEOUS | Status: AC
  Administered 2023-02-02: 120 mg via SUBCUTANEOUS
  Filled 2023-02-02: qty 1.7

## 2023-02-02 NOTE — Progress Notes (Signed)
[facility] [phone]   [phone]  OFFICE PROGRESS NOTE  [name], MD [address] [place] [address]  DIAGNOSIS: Stage IV (T2a, N2, M1 B) non-small cell lung cancer, squamous cell carcinoma presented with left upper lobe lung mass in addition to AP window lymphadenopathy and suspicious bone metastasis to the T8 and L4 vertebrae in addition to retroperitoneal metastatic soft tissue nodule diagnosed in April 2024.   Molecular studies by [id number] showed no actionable mutations and PD-L1 expression of 70%.  PRIOR THERAPY:  1) Palliative radiotherapy to the T8 and L4 metastatic bone disease. 2) ystemic chemotherapy with carboplatin for AUC of 5, paclitaxel 175 Mg/M2 and Libtayo (Cempilimab) 350 Mg IV every 3 weeks with Neulasta support for 4 cycles    CURRENT THERAPY: Maintenance treatment with single agent Libtayo (Cempilimab) 350 Mg IV every 3 weeks.  First dose September 08, 2022.  Status post 7 cycles.  INTERVAL HISTORY: Albert Hardy. 67 y.o. male returns to the clinic today for follow-up visit.Discussed the use of AI scribe software for clinical note transcription with the patient, who gave verbal consent to proceed.  History of Present Illness   Albert Hardy, a 67 year old patient, was diagnosed with stage four non-small cell lung cancer, specifically squamous cell carcinoma, in April 2024. The patient tested positive for PD-L1 at 70%. Palliative radiation was administered to T8 and L4, followed by chemotherapy with carboplatin, paclitaxel, and Libtayo. After four cycles, the patient was maintained on Libtayo alone every three weeks for seven cycles.  Recently, the patient has been experiencing discomfort on the left side, where the cancer is located. This discomfort has been constant for the past two days and was initially perceived as gas. The patient also reports a persistent cough, producing whitish-yellow sputum, but no  blood. No increase in shortness of breath was reported.  The patient has lost four pounds in the last week, despite believing he was eating well. He denies any increase in physical activity. The patient also mentioned an abnormality in his blood work results, but it was clarified that this is expected due to ongoing treatment. The patient's blood work was deemed acceptable for continuing treatment.        MEDICAL HISTORY: Past Medical History:  Diagnosis Date   Aortic regurgitation    Aortic stenosis 04/04/2018   Atherosclerosis of native arteries of the extremities with intermittent claudication 09/08/2013   Bilateral impacted cerumen 10/13/2018   Bilateral sensorineural hearing loss 12/14/2018   Dyspnea    Former moderate cigarette smoker (10-19 per day) 05/20/2012   Quit smoking Nov 2013 when hospitalized w/ HTN and chest pain(diagnosed w/ valvular heart disease)   GERD (gastroesophageal reflux disease)    Heart murmur    History of radiation therapy    Lumbar Spine, Thoracic Spine- 06/25/22-07/10/22- Dr. 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  MEDICATIONS:  Current Outpatient Medications  Medication Sig Dispense Refill   albuterol (VENTOLIN HFA) 108 (90 Base) MCG/ACT inhaler Inhale 2 puffs into the lungs every 6 (six) hours as needed for wheezing or shortness of breath. 8 g 6   Ascorbic Acid (VITAMIN C PO) Take  1 tablet by mouth daily.     Cholecalciferol (VITAMIN D3) 1000 UNITS CAPS Take 1,000 Units by mouth daily.     Coenzyme Q10 (CO Q 10 PO) Take 1 tablet by mouth daily.     Cyanocobalamin (VITAMIN B-12 PO) Take 1 tablet by mouth daily.     cyclobenzaprine (FLEXERIL) 5 MG tablet Take 1 tablet (5 mg total) by mouth 3  (three) times daily as needed for muscle spasms. 30 tablet 0   ELIQUIS 5 MG TABS tablet Take 1 tablet by mouth twice daily 60 tablet 0   ezetimibe (ZETIA) 10 MG tablet Take 1 tablet (10 mg total) by mouth daily. 90 tablet 3   fluticasone (FLONASE) 50 MCG/ACT nasal spray Place 1-2 sprays into both nostrils daily. 16 g 6   KRILL OIL PO Take 1 tablet by mouth daily.     levofloxacin (LEVAQUIN) 500 MG tablet Take 1 tablet (500 mg total) by mouth daily. 7 tablet 0   lisinopril (ZESTRIL) 10 MG tablet Take 1 tablet by mouth once daily 30 tablet 0   lisinopril-hydrochlorothiazide (ZESTORETIC) 10-12.5 MG tablet Take 1 tablet by mouth daily. 90 tablet 1   MAGNESIUM PO Take 1 tablet by mouth daily.     metoCLOPramide (REGLAN) 10 MG tablet Take 1 tablet (10 mg total) by mouth 4 (four) times daily -  before meals and at bedtime. 56 tablet 0   metoprolol tartrate (LOPRESSOR) 25 MG tablet Take 1 tablet (25 mg total) by mouth once for 1 dose. Please take this medication 2 hours before CT 1 tablet 0   morphine (MS CONTIN) 15 MG 12 hr tablet Take 1 tablet (15 mg total) by mouth every 12 (twelve) hours. 60 tablet 0   Multiple Vitamins-Minerals (CENTRUM SILVER PO) Take 1 tablet by mouth daily.     Multiple Vitamins-Minerals (ZINC PO) Take 1 tablet by mouth daily.     Naphazoline-Pheniramine (OPCON-A) 0.027-0.315 % SOLN Place 1 drop into both eyes daily as needed (redness).     nitroGLYCERIN (NITROSTAT) 0.4 MG SL tablet Place 1 tablet (0.4 mg total) under the tongue every 5 (five) minutes as needed for chest pain. 25 tablet 1   omeprazole (PRILOSEC) 20 MG capsule Take 1 capsule (20 mg total) by mouth daily. 90 capsule 1   oxyCODONE (OXY IR/ROXICODONE) 5 MG immediate release tablet Take 1 tablet (5 mg total) by mouth every 4 (four) hours as needed for severe pain (pain score 7-10). 90 tablet 0   potassium chloride SA (KLOR-CON M) 20 MEQ tablet Take 1 tablet (20 mEq total) by mouth daily. 6 tablet 0   prochlorperazine  (COMPAZINE) 10 MG tablet Take 1 tablet (10 mg total) by mouth every 6 (six) hours as needed for nausea or vomiting. 30 tablet 0   rosuvastatin (CRESTOR) 5 MG tablet Take 1 tablet (5 mg total) by mouth at bedtime. 90 tablet 1   TURMERIC PO Take 1 tablet by mouth 3 (three) times a week.     umeclidinium-vilanterol (ANORO ELLIPTA) 62.5-25 MCG/ACT AEPB Inhale 1 puff into the lungs daily. 180 each 3   No current facility-administered medications for this visit.    SURGICAL HISTORY:  Past Surgical History:  Procedure Laterality Date   BRONCHIAL BIOPSY  05/26/2022   Procedure: BRONCHIAL BIOPSIES;  Surgeon: Josephine Igo, DO;  Location: [facility];  Service: Pulmonary;;   BRONCHIAL BRUSHINGS  05/26/2022   Procedure: BRONCHIAL BRUSHINGS;  Surgeon: Josephine Igo, DO;  Location: [facility];  Service:  Pulmonary;;   NO PAST SURGERIES     VIDEO BRONCHOSCOPY  05/26/2022   Procedure: VIDEO BRONCHOSCOPY WITHOUT FLUORO;  Surgeon: Josephine Igo, DO;  Location: [facility];  Service: Pulmonary;;    REVIEW OF SYSTEMS:  Constitutional: positive for fatigue Eyes: negative Ears, nose, mouth, throat, and face: negative Respiratory: positive for cough and pleurisy/chest pain Cardiovascular: negative Gastrointestinal: negative Genitourinary:negative Integument/breast: negative Hematologic/lymphatic: negative Musculoskeletal:positive for arthralgias Neurological: negative Behavioral/Psych: negative Endocrine: negative Allergic/Immunologic: negative   PHYSICAL EXAMINATION: General appearance: alert, cooperative, and no distress Head: Normocephalic, without obvious abnormality, atraumatic Neck: no adenopathy, no JVD, supple, symmetrical, trachea midline, and thyroid not enlarged, symmetric, no tenderness/mass/nodules Lymph nodes: Cervical, supraclavicular, and axillary nodes normal. Resp: clear to auscultation bilaterally Back: symmetric, no curvature. ROM normal. No CVA tenderness. Cardio:  regular rate and rhythm, S1, S2 normal, no murmur, click, rub or gallop GI: soft, non-tender; bowel sounds normal; no masses,  no organomegaly Extremities: extremities normal, atraumatic, no cyanosis or edema Neurologic: Alert and oriented X 3, normal strength and tone. Normal symmetric reflexes. Normal coordination and gait  ECOG PERFORMANCE STATUS: 1 - Symptomatic but completely ambulatory  Blood pressure 127/61, pulse 72, temperature 98.2 F (36.8 C), temperature source Temporal, resp. rate 17, height 5\' 9"  (1.753 m), weight 137 lb 14.4 oz (62.6 kg), SpO2 100%.  LABORATORY DATA: Lab Results  Component Value Date   WBC 3.8 (L) 02/02/2023   HGB 12.3 (L) 02/02/2023   HCT 37.3 (L) 02/02/2023   MCV 88.2 02/02/2023   PLT 143 (L) 02/02/2023      Chemistry      Component Value Date/Time   NA 140 02/02/2023 0857   NA 146 (H) 01/05/2023 1114   K 3.7 02/02/2023 0857   CL 105 02/02/2023 0857   CO2 25 02/02/2023 0857   BUN 21 02/02/2023 0857   BUN 11 01/05/2023 1114   CREATININE 1.07 02/02/2023 0857   CREATININE 1.07 08/27/2014 1511      Component Value Date/Time   CALCIUM 9.5 02/02/2023 0857   ALKPHOS 55 02/02/2023 0857   AST 36 02/02/2023 0857   ALT 47 (H) 02/02/2023 0857   BILITOT 0.3 02/02/2023 0857       RADIOGRAPHIC STUDIES: CT CHEST ABDOMEN PELVIS W CONTRAST Result Date: 01/10/2023 CLINICAL DATA:  Metastatic lung cancer restaging * Tracking Code: BO * EXAM: CT CHEST, ABDOMEN, AND PELVIS WITH CONTRAST TECHNIQUE: Multidetector CT imaging of the chest, abdomen and pelvis was performed following the standard protocol during bolus administration of intravenous contrast. RADIATION DOSE REDUCTION: This exam was performed according to the departmental dose-optimization program which includes automated exposure control, adjustment of the mA and/or kV according to patient size and/or use of iterative reconstruction technique. CONTRAST:  OMNIPAQUE IOHEXOL 300 MG/ML  SOLN  COMPARISON:  11/03/2022 FINDINGS: CT CHEST FINDINGS Cardiovascular: Aortic atherosclerosis. Mild cardiomegaly. Similar small pericardial effusion. Mediastinum/Nodes: Slightly enlarged left hilar lymph node measuring 2.3 x 1.8 cm, previously 2.2 x 1.5 cm (series 2, image 61). Thyroid gland, trachea, and esophagus demonstrate no significant findings. Lungs/Pleura: Central obstruction of left upper lobe bronchi by left hilar mass with postobstructive bronchial plugging. Numerous new small ground-glass nodules throughout the suprahilar left upper lobe (series 6, image 67). Mild diffuse bilateral bronchial wall thickening. No pleural effusion or pneumothorax. Musculoskeletal: No chest wall abnormality. No acute osseous findings. CT ABDOMEN PELVIS FINDINGS Hepatobiliary: No solid liver abnormality is seen. Hepatic steatosis. No gallstones, gallbladder wall thickening, or biliary dilatation. Pancreas: Unremarkable. No pancreatic ductal  dilatation or surrounding inflammatory changes. Spleen: Normal in size without significant abnormality. Adrenals/Urinary Tract: Adrenal glands are unremarkable. Unchanged intermediate attenuation lesion arising from the posterior inferior pole of the left kidney measuring 0.7 cm (series 2, image 77). Diffuse thickening of the urinary bladder wall, likely secondary to chronic outlet obstruction. Stomach/Bowel: Stomach is within normal limits. Appendix appears normal. No evidence of bowel wall thickening, distention, or inflammatory changes. Vascular/Lymphatic: Aortic atherosclerosis. No enlarged abdominal or pelvic lymph nodes. Reproductive: Mild prostatomegaly. Other: No abdominal wall hernia or abnormality. Small volume free fluid in the low pelvis, similar to prior. Musculoskeletal: No acute osseous findings. Unchanged mixed lytic and sclerotic osseous metastases of T8 and L4 (series 5, image 97). IMPRESSION: 1. Slightly enlarged left hilar lymph node. Central obstruction of left upper  lobe bronchi by left hilar mass with postobstructive bronchial plugging. Numerous new small ground-glass nodules throughout the suprahilar left upper lobe, consistent with postobstructive infection or inflammation. 2. 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 3. No evidence of lymphadenopathy or soft tissue metastatic disease in the abdomen or pelvis. 4. Unchanged intermediate attenuation lesion arising from the posterior inferior pole of the left kidney measuring 0.7 cm, possibly hemorrhagic or proteinaceous cyst although solid mass not excluded. Continued attention on follow-up. 5. Hepatic steatosis. 6. Small volume free fluid in the low pelvis, similar to prior. Aortic Atherosclerosis (ICD10-I70.0). Electronically Signed   By: Jearld Lesch M.D.   On: 01/10/2023 16:16    ASSESSMENT AND PLAN: This is a very pleasant 67 years old African-American male with Stage IV (T2a, N2, M1b) non-small cell lung cancer, squamous cell carcinoma presented with left upper lobe lung mass in addition to AP window lymphadenopathy and suspicious bone metastasis to the T8 and L4 vertebrae as well as right retroperitoneal metastatic nodule diagnosed in April 2024.  Molecular studies by [id number] showed no actionable mutations and PD-L1 expression of 70%. The patient underwent palliative combination of systemic chemotherapy with carboplatin for AUC of 5, paclitaxel 175 Mg/M2 and immunotherapy with Libtayo (Cempilimab) 350 Mg IV every 3 weeks with Neulasta support for 4 cycles followed by maintenance treatment with Libtayo (Cempilimab) of the patient has no disease progression after cycle #4.  He is status post 4 cycles. He is currently undergoing maintenance treatment with single agent Libtayo (Cempilimab) 350 Mg IV every 3 weeks status post 7 cycles.  He has been tolerating this treatment fairly well.    Stage IV Non-Small Cell Lung Cancer (NSCLC), Squamous Cell Carcinoma Diagnosed in April 2024  with stage IV NSCLC, squamous cell carcinoma. PD-L1 expression at 70%. Underwent palliative radiation to T8 and L4, followed by chemotherapy with carboplatin, paclitaxel, and Libtayo. Currently on Libtayo every three weeks. Recent scan shows slight increase in left hilar density, not significantly different from the last scan. Reports left-sided discomfort, likely unrelated to the small change in the scan. No significant changes in cough, dyspnea, or other symptoms. Mild weight loss noted. Blood work shows acceptable abnormalities for ongoing treatment. - Continue Libtayo maintenance therapy every three weeks - Monitor symptoms, especially hemoptysis or increased dyspnea - Schedule next scan to monitor left hilar density - Proceed with current treatment as blood work is acceptable  General Health Maintenance No new significant health maintenance issues discussed. - Continue monitoring blood work for treatment acceptability  Follow-up - Proceed with scheduled treatment and follow-up appointments.   The patient was advised to call immediately if he has any concerning symptoms in the interval.  For the back pain he underwent palliative radiotherapy to the bone disease at T8 and L4 vertebral lesions. For the bone metastasis, he is currently on treatment with Xgeva 120 mg subcutaneously every 6 weeks.  The patient voices understanding of current disease status and treatment options and is in agreement with the current care plan.  All questions were answered. The patient knows to call the clinic with any problems, questions or concerns. We can certainly see the patient much sooner if necessary.  The total time spent in the appointment was 30 minutes.  Disclaimer: This note was dictated with voice recognition software. Similar sounding words can inadvertently be transcribed and may not be corrected upon review.

## 2023-02-02 NOTE — Patient Instructions (Signed)
[facility] - A DEPT OF [facility]  Discharge Instructions: Thank you for choosing [facility] to provide your oncology and hematology care.   If you have a lab appointment with the Cancer Center, please go directly to the Cancer Center and check in at the registration area.   Wear comfortable clothing and clothing appropriate for easy access to any Portacath or PICC line.   We strive to give you quality time with your provider. You may need to reschedule your appointment if you arrive late (15 or more minutes).  Arriving late affects you and other patients whose appointments are after yours.  Also, if you miss three or more appointments without notifying the office, you may be dismissed from the clinic at the provider's discretion.      For prescription refill requests, have your pharmacy contact our office and allow 72 hours for refills to be completed.    Today you received the following chemotherapy and/or immunotherapy agents: Libtayo      To help prevent nausea and vomiting after your treatment, we encourage you to take your nausea medication as directed.  BELOW ARE SYMPTOMS THAT SHOULD BE REPORTED IMMEDIATELY: *FEVER GREATER THAN 100.4 F (38 C) OR HIGHER *CHILLS OR SWEATING *NAUSEA AND VOMITING THAT IS NOT CONTROLLED WITH YOUR NAUSEA MEDICATION *UNUSUAL SHORTNESS OF BREATH *UNUSUAL BRUISING OR BLEEDING *URINARY PROBLEMS (pain or burning when urinating, or frequent urination) *BOWEL PROBLEMS (unusual diarrhea, constipation, pain near the anus) TENDERNESS IN MOUTH AND THROAT WITH OR WITHOUT PRESENCE OF ULCERS (sore throat, sores in mouth, or a toothache) UNUSUAL RASH, SWELLING OR PAIN  UNUSUAL VAGINAL DISCHARGE OR ITCHING   Items with * indicate a potential emergency and should be followed up as soon as possible or go to the Emergency Department if any problems should occur.  Please show the CHEMOTHERAPY ALERT CARD or IMMUNOTHERAPY  ALERT CARD at check-in to the Emergency Department and triage nurse.  Should you have questions after your visit or need to cancel or reschedule your appointment, please contact [facility] - A DEPT OF [facility]  Dept: [phone]  and follow the prompts.  Office hours are 8:00 a.m. to 4:30 p.m. Monday - Friday. Please note that voicemails left after 4:00 p.m. may not be returned until the following business day.  We are closed weekends and major holidays. You have access to a nurse at all times for urgent questions. Please call the main number to the clinic Dept: [phone] and follow the prompts.   For any non-urgent questions, you may also contact your provider using MyChart. We now offer e-Visits for anyone 21 and older to request care online for non-urgent symptoms. For details visit [email or web address].   Also download the MyChart app! Go to the app store, search "MyChart", open the app, select [place], and log in with your MyChart username and password.  Denosumab Injection (Oncology) What is this medication? DENOSUMAB (den oh SUE mab) prevents weakened bones caused by cancer. It may also be used to treat noncancerous bone tumors that cannot be removed by surgery. It can also be used to treat high calcium levels in the blood caused by cancer. It works by blocking a protein that causes bones to break down quickly. This slows down the release of calcium from bones, which lowers calcium levels in your blood. It also makes your bones stronger and less likely to break (fracture). This  medicine may be used for other purposes; ask your health care provider or pharmacist if you have questions. COMMON BRAND NAME(S): XGEVA What should I tell my care team before I take this medication? They need to know if you have any of these conditions: Dental disease Having surgery or tooth extraction Infection Kidney disease Low levels of calcium or vitamin D in the  blood Malnutrition On hemodialysis Skin conditions or sensitivity Thyroid or parathyroid disease An unusual reaction to denosumab, other medications, foods, dyes, or preservatives Pregnant or trying to get pregnant Breast-feeding How should I use this medication? This medication is for injection under the skin. It is given by your care team in a hospital or clinic setting. A special MedGuide will be given to you before each treatment. Be sure to read this information carefully each time. Talk to your care team about the use of this medication in children. While it may be prescribed for children as young as 13 years for selected conditions, precautions do apply. Overdosage: If you think you have taken too much of this medicine contact a poison control center or emergency room at once. NOTE: This medicine is only for you. Do not share this medicine with others. What if I miss a dose? Keep appointments for follow-up doses. It is important not to miss your dose. Call your care team if you are unable to keep an appointment. What may interact with this medication? Do not take this medication with any of the following: Other medications containing denosumab This medication may also interact with the following: Medications that lower your chance of fighting infection Steroid medications, such as prednisone or cortisone This list may not describe all possible interactions. Give your health care provider a list of all the medicines, herbs, non-prescription drugs, or dietary supplements you use. Also tell them if you smoke, drink alcohol, or use illegal drugs. Some items may interact with your medicine. What should I watch for while using this medication? Your condition will be monitored carefully while you are receiving this medication. You may need blood work while taking this medication. This medication may increase your risk of getting an infection. Call your care team for advice if you get a  fever, chills, sore throat, or other symptoms of a cold or flu. Do not treat yourself. Try to avoid being around people who are sick. You should make sure you get enough calcium and vitamin D while you are taking this medication, unless your care team tells you not to. Discuss the foods you eat and the vitamins you take with your care team. Some people who take this medication have severe bone, joint, or muscle pain. This medication may also increase your risk for jaw problems or a broken thigh bone. Tell your care team right away if you have severe pain in your jaw, bones, joints, or muscles. Tell your care team if you have any pain that does not go away or that gets worse. Talk to your care team if you may be pregnant. Serious birth defects can occur if you take this medication during pregnancy and for 5 months after the last dose. You will need a negative pregnancy test before starting this medication. Contraception is recommended while taking this medication and for 5 months after the last dose. Your care team can help you find the option that works for you. What side effects may I notice from receiving this medication? Side effects that you should report to your care team as soon as  possible: Allergic reactions--skin rash, itching, hives, swelling of the face, lips, tongue, or throat Bone, joint, or muscle pain Low calcium level--muscle pain or cramps, confusion, tingling, or numbness in the hands or feet Osteonecrosis of the jaw--pain, swelling, or redness in the mouth, numbness of the jaw, poor healing after dental work, unusual discharge from the mouth, visible bones in the mouth Side effects that usually do not require medical attention (report to your care team if they continue or are bothersome): Cough Diarrhea Fatigue Headache Nausea This list may not describe all possible side effects. Call your doctor for medical advice about side effects. You may report side effects to FDA at  1-800-FDA-1088. Where should I keep my medication? This medication is given in a hospital or clinic. It will not be stored at home. NOTE: This sheet is a summary. It may not cover all possible information. If you have questions about this medicine, talk to your doctor, pharmacist, or health care provider.  2024 Elsevier/Gold Standard (2021-06-18 00:00:00)

## 2023-02-04 LAB — T4: T4, Total: 7.9 ug/dL (ref 4.5–12.0)

## 2023-02-10 ENCOUNTER — Other Ambulatory Visit: Payer: Self-pay | Admitting: Physician Assistant

## 2023-02-10 DIAGNOSIS — I2699 Other pulmonary embolism without acute cor pulmonale: Secondary | ICD-10-CM

## 2023-02-11 ENCOUNTER — Encounter: Payer: Self-pay | Admitting: Internal Medicine

## 2023-02-11 ENCOUNTER — Other Ambulatory Visit: Payer: Self-pay | Admitting: Physician Assistant

## 2023-02-11 DIAGNOSIS — I2699 Other pulmonary embolism without acute cor pulmonale: Secondary | ICD-10-CM

## 2023-02-11 MED ORDER — APIXABAN 5 MG PO TABS: 5.0000 mg | ORAL_TABLET | Freq: Two times a day (BID) | ORAL | 3 refills | Status: DC

## 2023-02-17 ENCOUNTER — Ambulatory Visit: Payer: Medicare Other | Admitting: Family Medicine

## 2023-02-17 ENCOUNTER — Encounter: Payer: Self-pay | Admitting: Family Medicine

## 2023-02-17 VITALS — BP 126/80 | HR 84 | Temp 98.2°F | Ht 69.0 in | Wt 134.6 lb

## 2023-02-17 DIAGNOSIS — I10 Essential (primary) hypertension: Secondary | ICD-10-CM

## 2023-02-17 DIAGNOSIS — Z23 Encounter for immunization: Secondary | ICD-10-CM | POA: Diagnosis not present

## 2023-02-17 NOTE — Patient Instructions (Addendum)
 Thanks for coming in today.  Blood pressure looks good on the current regimen.  Check your blood pressure every week or 2 and if any elevated readings or lower readings or side effects with your medications please be seen, but otherwise can follow-up in 3 months.    Please let me know if there are questions.

## 2023-02-17 NOTE — Progress Notes (Signed)
 Subjective:  Patient ID: Albert Nida Raddle., male    DOB: 09-03-1955  Age: 68 y.o. MRN: [id number]  CC:  Chief Complaint  Patient presents with   Hypertension    Pt is feeling well, pt notes has not been monitoring numbers, notes no longer feels palpitations     HPI Albert Nida Raddle. presents for   Hypertension: With history of hypotension, possibly overcontrolled hypertension in August, medications were adjusted including decreasing lisinopril  and hydrochlorothiazide  dosing in half, seen by cardiology, with instructions on holding meds if needed.  Ultimately his blood pressure did start to increase again, and when seen in December readings have been in the 1 50-1 60 range over the 70-80 range, and prior chest pains had improved.  Of note he is on Eliquis  for prior PE but no new bleeding.  With elevated readings we added back the hydrochlorothiazide  12.5 mg and continued on lower dose of lisinopril  10 mg with continued home monitoring.  He is under the care of oncology with metastatic lung cancer and palliative systemic chemotherapy, MS Contin  with Oxy IR as needed for pain control  Doing well on current dose. No symptoms of low blood pressure, no palpitations or new chest pains. Not lightheaded or dizzy. No new side effects of meds.  Home readings: 130/68-70 range.  BP Readings from Last 3 Encounters:  02/17/23 126/80  02/02/23 127/61  01/13/23 (!) 166/78   Lab Results  Component Value Date   CREATININE 1.07 02/02/2023   HM:  Flu vaccine today.  Pneumovax in 2023.  Plans on covid booster at pharmacy.   Immunization History  Administered Date(s) Administered   Influenza, High Dose Seasonal PF 12/01/2021   Influenza,inj,Quad PF,6+ Mos 10/25/2019   Moderna Sars-Covid-2 Vaccination 06/14/2019, 07/19/2019   PFIZER(Purple Top)SARS-COV-2 Vaccination 04/09/2020, 07/25/2020   PNEUMOCOCCAL CONJUGATE-20 06/12/2021   Tdap 08/27/2014   Zoster Recombinant(Shingrix) 10/25/2019      History Patient Active Problem List   Diagnosis Date Noted   Encounter for antineoplastic chemotherapy 08/18/2022   Encounter for antineoplastic immunotherapy 08/18/2022   Squamous cell carcinoma of lung, stage IV, left (HCC) 05/29/2022   Lung nodule 05/15/2022   Heart murmur 08/28/2021   Substance abuse (HCC) 08/28/2021   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   Bilateral sensorineural hearing loss 12/14/2018   Bilateral impacted cerumen 10/13/2018   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   Aortic stenosis 04/04/2018   Atherosclerosis of native artery of extremity with intermittent claudication (HCC) 09/08/2013   Former moderate cigarette smoker (10-19 per day) 05/20/2012   Peripheral vascular disease with claudication    Hyperlipidemia    Aortic regurgitation    GERD (gastroesophageal reflux disease)    Hypertension    Past Medical History:  Diagnosis Date   Aortic regurgitation    Aortic stenosis 04/04/2018   Atherosclerosis of native arteries of the extremities with intermittent claudication 09/08/2013   Bilateral impacted cerumen 10/13/2018   Bilateral sensorineural hearing loss 12/14/2018   Dyspnea    Former moderate cigarette smoker (10-19 per day) 05/20/2012   Quit smoking Nov 2013 when hospitalized w/ HTN and chest pain(diagnosed w/ valvular heart disease)   GERD (gastroesophageal reflux disease)    Heart murmur    History of radiation therapy    Lumbar Spine, Thoracic Spine- 06/25/22-07/10/22- Dr. Lynwood Nasuti   Hyperlipidemia    Hypertension    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   Peripheral vascular disease with claudication    ABI .49     Subjective tinnitus of  both ears 10/13/2018   Substance abuse [facility])    Past Surgical History:  Procedure Laterality Date   BRONCHIAL BIOPSY  05/26/2022   Procedure: BRONCHIAL BIOPSIES;  Surgeon: Brenna Adine CROME, DO;  Location: [facility] ENDOSCOPY;  Service: Pulmonary;;   BRONCHIAL BRUSHINGS  05/26/2022    Procedure: BRONCHIAL BRUSHINGS;  Surgeon: Brenna Adine CROME, DO;  Location: [facility] ENDOSCOPY;  Service: Pulmonary;;   NO PAST SURGERIES     VIDEO BRONCHOSCOPY  05/26/2022   Procedure: VIDEO BRONCHOSCOPY WITHOUT FLUORO;  Surgeon: Brenna Adine CROME, DO;  Location: [facility] ENDOSCOPY;  Service: Pulmonary;;   Allergies  Allergen Reactions   Lipitor [Atorvastatin]     myalgia   Pravastatin Other (See Comments)    myalgia   Prior to Admission medications   Medication Sig Start Date End Date Taking? Authorizing Provider  albuterol  (VENTOLIN  HFA) 108 (90 Base) MCG/ACT inhaler Inhale 2 puffs into the lungs every 6 (six) hours as needed for wheezing or shortness of breath. 09/02/22  Yes [name], MD  apixaban  (ELIQUIS ) 5 MG TABS tablet Take 1 tablet (5 mg total) by mouth 2 (two) times daily. 02/11/23  Yes Heilingoetter, Cassandra L, PA-C  Ascorbic Acid (VITAMIN C PO) Take 1 tablet by mouth daily.   Yes [provider]  Cholecalciferol (VITAMIN D3) 1000 UNITS CAPS Take 1,000 Units by mouth daily.   Yes [provider]  Coenzyme Q10 (CO Q 10 PO) Take 1 tablet by mouth daily.   Yes [provider]  Cyanocobalamin (VITAMIN B-12 PO) Take 1 tablet by mouth daily.   Yes [provider]  cyclobenzaprine (FLEXERIL) 5 MG tablet Take 1 tablet (5 mg total) by mouth 3 (three) times daily as needed for muscle spasms. 06/18/22  Yes Pickenpack-Cousar, Fannie SAILOR, NP  ezetimibe  (ZETIA ) 10 MG tablet Take 1 tablet (10 mg total) by mouth daily. 09/02/22  Yes [name], MD  fluticasone  (FLONASE ) 50 MCG/ACT nasal spray Place 1-2 sprays into both nostrils daily. 04/24/20  Yes [name], MD  KRILL OIL PO Take 1 tablet by mouth daily.   Yes [provider]  levofloxacin  (LEVAQUIN ) 500 MG tablet Take 1 tablet (500 mg total) by mouth daily. 01/12/23  Yes Boscia, Heather E, NP  lisinopril -hydrochlorothiazide  (ZESTORETIC ) 10-12.5 MG tablet Take 1 tablet by mouth daily. 01/13/23  Yes  [name], MD  MAGNESIUM PO Take 1 tablet by mouth daily.   Yes [provider]  metoCLOPramide  (REGLAN ) 10 MG tablet Take 1 tablet (10 mg total) by mouth 4 (four) times daily -  before meals and at bedtime. 07/20/22  Yes Pickenpack-Cousar, Fannie SAILOR, NP  morphine  (MS CONTIN ) 15 MG 12 hr tablet Take 1 tablet (15 mg total) by mouth every 12 (twelve) hours. 01/12/23  Yes Pickenpack-Cousar, Fannie SAILOR, NP  Multiple Vitamins-Minerals (CENTRUM SILVER PO) Take 1 tablet by mouth daily.   Yes [provider]  Multiple Vitamins-Minerals (ZINC PO) Take 1 tablet by mouth daily.   Yes [provider]  Naphazoline-Pheniramine (OPCON-A) 0.027-0.315 % SOLN Place 1 drop into both eyes daily as needed (redness).   Yes [provider]  nitroGLYCERIN  (NITROSTAT ) 0.4 MG SL tablet Place 1 tablet (0.4 mg total) under the tongue every 5 (five) minutes as needed for chest pain. 10/05/22  Yes Monetta Redell PARAS, MD  omeprazole  (PRILOSEC) 20 MG capsule Take 1 capsule (20 mg total) by mouth daily. 09/02/22  Yes [name], MD  oxyCODONE  (OXY IR/ROXICODONE ) 5 MG immediate release  tablet Take 1 tablet (5 mg total) by mouth every 4 (four) hours as needed for severe pain (pain score 7-10). 12/01/22  Yes Pickenpack-Cousar, Fannie SAILOR, NP  potassium chloride  SA (KLOR-CON  M) 20 MEQ tablet Take 1 tablet (20 mEq total) by mouth daily. 12/22/22  Yes Heilingoetter, Cassandra L, PA-C  prochlorperazine  (COMPAZINE ) 10 MG tablet Take 1 tablet (10 mg total) by mouth every 6 (six) hours as needed for nausea or vomiting. 06/09/22  Yes Sherrod Sherrod, MD  rosuvastatin  (CRESTOR ) 5 MG tablet Take 1 tablet (5 mg total) by mouth at bedtime. 09/02/22  Yes [name], MD  TURMERIC PO Take 1 tablet by mouth 3 (three) times a week.   Yes [provider]  umeclidinium-vilanterol (ANORO ELLIPTA ) 62.5-25 MCG/ACT AEPB Inhale 1 puff into the lungs daily. 09/16/22  Yes [name], MD  metoprolol   tartrate (LOPRESSOR ) 25 MG tablet Take 1 tablet (25 mg total) by mouth once for 1 dose. Please take this medication 2 hours before CT 01/05/23 01/05/23  Monetta Redell PARAS, MD   Social History   Socioeconomic History   Marital status: Married    Spouse name: Zebedee   Number of children: Not on file   Years of education: Not on file   Highest education level: Some college, no degree  Occupational History   Occupation: Recruitment Consultant  Tobacco Use   Smoking status: Former    Current packs/day: 0.00    Average packs/day: 1 pack/day for 30.0 years (30.0 ttl pk-yrs)    Types: Cigarettes    Start date: 12/20/1981    Quit date: 12/21/2011    Years since quitting: 11.1   Smokeless tobacco: Never  Vaping Use   Vaping status: Every Day  Substance and Sexual Activity   Alcohol use: Yes    Comment: Ocassionaly.    Drug use: Yes    Frequency: 1.0 times per week    Types: Marijuana   Sexual activity: Yes    Birth control/protection: None  Other Topics Concern   Not on file  Social History Narrative   Married. Education: [employer]. Exercise:  Somewhat.   Social Drivers of Corporate Investment Banker Strain: Low Risk  (02/16/2023)   Overall Financial Resource Strain (CARDIA)    Difficulty of Paying Living Expenses: Not hard at all  Food Insecurity: No Food Insecurity (02/16/2023)   Hunger Vital Sign    Worried About Running Out of Food in the Last Year: Never true    Ran Out of Food in the Last Year: Never true  Transportation Needs: No Transportation Needs (02/16/2023)   PRAPARE - Administrator, Civil Service (Medical): No    Lack of Transportation (Non-Medical): No  Physical Activity: Inactive (02/16/2023)   Exercise Vital Sign    Days of Exercise per Week: 0 days    Minutes of Exercise per Session: 0 min  Stress: No Stress Concern Present (02/16/2023)   [employer] of Occupational Health - Occupational Stress Questionnaire    Feeling of Stress : Only a little   Social Connections: Socially Integrated (02/16/2023)   Social Connection and Isolation Panel [NHANES]    Frequency of Communication with Friends and Family: Three times a week    Frequency of Social Gatherings with Friends and Family: Once a week    Attends Religious Services: More than 4 times per year    Active Member of [employer] or Organizations: Yes    Attends Banker Meetings: More than 4  times per year    Marital Status: Married  Catering Manager Violence: Not At Risk (11/12/2022)   Humiliation, Afraid, Rape, and Kick questionnaire    Fear of Current or Ex-Partner: No    Emotionally Abused: No    Physically Abused: No    Sexually Abused: No    Review of Systems  Constitutional:  Negative for fatigue and unexpected weight change.  Eyes:  Negative for visual disturbance.  Respiratory:  Negative for cough, chest tightness and shortness of breath.   Cardiovascular:  Negative for chest pain, palpitations and leg swelling.  Gastrointestinal:  Negative for abdominal pain and blood in stool.  Neurological:  Negative for dizziness, light-headedness and headaches.     Objective:   Vitals:   02/17/23 0952  BP: 126/80  Pulse: 84  Temp: 98.2 F (36.8 C)  TempSrc: Temporal  SpO2: 97%  Weight: 134 lb 9.6 oz (61.1 kg)  Height: 5' 9 (1.753 m)     Physical Exam Vitals reviewed.  Constitutional:      Appearance: He is well-developed.  HENT:     Head: Normocephalic and atraumatic.  Neck:     Vascular: No carotid bruit or JVD.  Cardiovascular:     Rate and Rhythm: Normal rate and regular rhythm.     Heart sounds: Normal heart sounds. No murmur heard. Pulmonary:     Effort: Pulmonary effort is normal.     Breath sounds: Normal breath sounds. No rales.  Musculoskeletal:     Right lower leg: No edema.     Left lower leg: No edema.  Skin:    General: Skin is warm and dry.  Neurological:     Mental Status: He is alert and oriented to person, place, and time.   Psychiatric:        Mood and Affect: Mood normal.     Assessment & Plan:  Albert Overturf. is a 68 y.o. male . Essential hypertension  -Stable on current regimen without new side effects or signs or symptoms of hypotension.  Continue lisinopril  10 mg, hydrochlorothiazide  12.5 mg dose with combo medicine.  Ongoing labs with oncology, no blood work today.  59-month follow-up with RTC precautions given.  Need for immunization against influenza - Plan: Flu Vaccine Trivalent High Dose (Fluad)   No orders of the defined types were placed in this encounter.  Patient Instructions  Thanks for coming in today.  Blood pressure looks good on the current regimen.  Check your blood pressure every week or 2 and if any elevated readings or lower readings or side effects with your medications please be seen, but otherwise can follow-up in 3 months.    Please let me know if there are questions.      Signed,   Albert Pines, MD [facility], [facility] 02/17/23 10:22 AM

## 2023-02-20 NOTE — Progress Notes (Signed)
 [facility] OFFICE PROGRESS NOTE  Albert Reyes [name], MD [phone] [place] [address]  DIAGNOSIS: Stage IV (T2a, N2, M1 B) non-small cell lung cancer, squamous cell carcinoma presented with left upper lobe lung mass in addition to AP window lymphadenopathy and suspicious bone metastasis to the T8 and L4 vertebrae in addition to retroperitoneal metastatic soft tissue nodule diagnosed in April 2024.    Molecular studies by [email or web address] showed no actionable mutations and PD-L1 expression of 70%.  PRIOR THERAPY: 1) Palliative radiotherapy to the T8 and L4 metastatic bone disease. 2) Systemic chemotherapy with carboplatin  for AUC of 5, paclitaxel  175 Mg/M2 and Libtayo  (Cempilimab) 350 Mg IV every 3 weeks with Neulasta  support for 4 cycles    CURRENT THERAPY: Maintenance treatment with single agent Libtayo  (Cempilimab) 350 Mg IV every 3 weeks.  First dose September 08, 2022.  Status post 8 cycles.  2) Xgeva  every 6 weeks  INTERVAL HISTORY: Albert Hardy. 68 y.o. male returns to the clinic today for a follow-up visit. He completed chemotherapy/immunotherpay as well as pallitive radiation to the bones. He is currently on mtainiance immunotherapy and tolerating this well.   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 He is on an iron supplement for anemia.   Today he denies any fever or chills. He sometimes feels hot/sweaty but he is not sure if this is environmental and attributed to the blankets. He did lost a few pounds since last being seen. He drinks [employer] brand protein supplemental drinks 1x per day. He states his breathing is ok. He believes he has been more conscious of his breathing/shortness of breath a little more. He denies any change in his frequency, severity, or character of his chronic intermittent cough. He denies any chest pain or hemoptysis.  Denies any nausea, vomiting, or diarrhea.  He forgot to take his Senakot on a regular basis last week and had more  constipation.  He follows closely with palliative care for which he is currently taking Flexeril and oxycodone .  He states he is not having any pain at this time. He is scheduled to meet with palliative care today. Denies any rashes or skin changes. He is here today for evaluation before proceeding with cycle #9.     MEDICAL HISTORY: Past Medical History:  Diagnosis Date   Aortic regurgitation    Aortic stenosis 04/04/2018   Atherosclerosis of native arteries of the extremities with intermittent claudication 09/08/2013   Bilateral impacted cerumen 10/13/2018   Bilateral sensorineural hearing loss 12/14/2018   Dyspnea    Former moderate cigarette smoker (10-19 per day) 05/20/2012   Quit smoking Nov 2013 when hospitalized w/ HTN and chest pain(diagnosed w/ valvular heart disease)   GERD (gastroesophageal reflux disease)    Heart murmur    History of radiation therapy    Lumbar Spine, Thoracic Spine- 06/25/22-07/10/22- Dr. 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  MEDICATIONS:  Current Outpatient Medications  Medication Sig Dispense Refill   albuterol  (VENTOLIN  HFA) 108 (90 Base) MCG/ACT inhaler Inhale 2 puffs into the lungs every 6 (six) hours as needed for wheezing or shortness of breath. 8 g 6   apixaban  (ELIQUIS ) 5 MG  TABS tablet Take 1 tablet (5 mg total) by mouth 2 (two) times daily. 60 tablet 3   Ascorbic Acid (VITAMIN C PO) Take 1 tablet by mouth daily.     Cholecalciferol (VITAMIN D3) 1000 UNITS CAPS Take 1,000 Units by mouth daily.     Coenzyme Q10 (CO Q 10 PO) Take 1 tablet by mouth daily.     Cyanocobalamin (VITAMIN B-12 PO) Take 1 tablet by mouth daily.     cyclobenzaprine (FLEXERIL) 5 MG tablet Take 1 tablet (5 mg total) by  mouth 3 (three) times daily as needed for muscle spasms. 30 tablet 0   ezetimibe  (ZETIA ) 10 MG tablet Take 1 tablet (10 mg total) by mouth daily. 90 tablet 3   fluticasone  (FLONASE ) 50 MCG/ACT nasal spray Place 1-2 sprays into both nostrils daily. 16 g 6   KRILL OIL PO Take 1 tablet by mouth daily.     levofloxacin  (LEVAQUIN ) 500 MG tablet Take 1 tablet (500 mg total) by mouth daily. 7 tablet 0   lisinopril -hydrochlorothiazide  (ZESTORETIC ) 10-12.5 MG tablet Take 1 tablet by mouth daily. 90 tablet 1   MAGNESIUM PO Take 1 tablet by mouth daily.     metoCLOPramide  (REGLAN ) 10 MG tablet Take 1 tablet (10 mg total) by mouth 4 (four) times daily -  before meals and at bedtime. 56 tablet 0   metoprolol  tartrate (LOPRESSOR ) 25 MG tablet Take 1 tablet (25 mg total) by mouth once for 1 dose. Please take this medication 2 hours before CT 1 tablet 0   morphine  (MS CONTIN ) 15 MG 12 hr tablet Take 1 tablet (15 mg total) by mouth every 12 (twelve) hours. 60 tablet 0   Multiple Vitamins-Minerals (CENTRUM SILVER PO) Take 1 tablet by mouth daily.     Multiple Vitamins-Minerals (ZINC PO) Take 1 tablet by mouth daily.     Naphazoline-Pheniramine (OPCON-A) 0.027-0.315 % SOLN Place 1 drop into both eyes daily as needed (redness).     nitroGLYCERIN  (NITROSTAT ) 0.4 MG SL tablet Place 1 tablet (0.4 mg total) under the tongue every 5 (five) minutes as needed for chest pain. 25 tablet 1   omeprazole  (PRILOSEC) 20 MG capsule Take 1 capsule (20 mg total) by mouth daily. 90 capsule 1   oxyCODONE  (OXY IR/ROXICODONE ) 5 MG immediate release tablet Take 1 tablet (5 mg total) by mouth every 4 (four) hours as needed for severe pain (pain score 7-10). 90 tablet 0   potassium chloride  SA (KLOR-CON  M) 20 MEQ tablet Take 1 tablet (20 mEq total) by mouth daily. 6 tablet 0   prochlorperazine  (COMPAZINE ) 10 MG tablet Take 1 tablet (10 mg total) by mouth every 6 (six) hours as needed for nausea or vomiting. 30 tablet 0   rosuvastatin   (CRESTOR ) 5 MG tablet Take 1 tablet (5 mg total) by mouth at bedtime. 90 tablet 1   TURMERIC PO Take 1 tablet by mouth 3 (three) times a week.     umeclidinium-vilanterol (ANORO ELLIPTA ) 62.5-25 MCG/ACT AEPB Inhale 1 puff into the lungs daily. 180 each 3   No current facility-administered medications for this visit.    SURGICAL HISTORY:  Past Surgical History:  Procedure Laterality Date   BRONCHIAL BIOPSY  05/26/2022   Procedure: BRONCHIAL BIOPSIES;  Surgeon: Brenna Adine CROME, DO;  Location: [facility];  Service: Pulmonary;;   BRONCHIAL BRUSHINGS  05/26/2022   Procedure: BRONCHIAL BRUSHINGS;  Surgeon: Brenna Adine CROME, DO;  Location: [facility];  Service: Pulmonary;;   NO PAST SURGERIES  VIDEO BRONCHOSCOPY  05/26/2022   Procedure: VIDEO BRONCHOSCOPY WITHOUT FLUORO;  Surgeon: Brenna Adine CROME, DO;  Location: [facility];  Service: Pulmonary;;    REVIEW OF SYSTEMS:   Constitutional: Positive for weight loss. Negative for appetite change, chills, fatigue, and fever.  HENT: Negative for mouth sores, nosebleeds, sore throat and trouble swallowing.   Eyes: Negative for eye problems and icterus.  Respiratory: Positive for intermittent chronic cough which he state is unchanged. Slight increase in dyspnea on exertion. Negative for hemoptysis and wheezing.   Cardiovascular: Negative for chest pain and leg swelling.  Gastrointestinal: Positive for constipation last week. Negative for abdominal pain,  diarrhea, nausea and vomiting.  Genitourinary: Negative for bladder incontinence, difficulty urinating, dysuria, frequency and hematuria.   Musculoskeletal: Negative for back pain, gait problem, neck pain and neck stiffness.  Skin: Negative for itching and rash.  Neurological: Negative for dizziness, extremity weakness, gait problem, headaches, light-headedness and seizures.  Hematological: Negative for adenopathy. Does not bruise/bleed easily.  Psychiatric/Behavioral: Negative for confusion,  depression and sleep disturbance. The patient is not nervous/anxious.     PHYSICAL EXAMINATION:  There were no vitals taken for this visit.  ECOG PERFORMANCE STATUS: 1  Physical Exam  Constitutional: Oriented to person, place, and time and thin appearing male, and in no distress.  HENT:  Head: Normocephalic and atraumatic.  Mouth/Throat: Oropharynx is clear and moist. No oropharyngeal exudate.  Eyes: Conjunctivae are normal. Right eye exhibits no discharge. Left eye exhibits no discharge. No scleral icterus.  Neck: Normal range of motion. Neck supple.  Cardiovascular: Normal rate, regular rhythm, murmur noted (he sees cardiology) and intact distal pulses.   Pulmonary/Chest: Effort normal and breath sounds normal. No respiratory distress. No wheezes. No rales.  Abdominal: Soft. Bowel sounds are normal. Exhibits no distension and no mass. There is no tenderness.  Musculoskeletal: Normal range of motion. Exhibits no edema.  Lymphadenopathy:    No cervical adenopathy.  Neurological: Alert and oriented to person, place, and time. Exhibits normal muscle tone. Gait normal. Coordination normal.  Skin: Skin is warm and dry. No rash noted. Not diaphoretic. No erythema. No pallor.  Psychiatric: Mood, memory and judgment normal.  Vitals reviewed.  LABORATORY DATA: Lab Results  Component Value Date   WBC 3.8 (L) 02/02/2023   HGB 12.3 (L) 02/02/2023   HCT 37.3 (L) 02/02/2023   MCV 88.2 02/02/2023   PLT 143 (L) 02/02/2023      Chemistry      Component Value Date/Time   NA 140 02/02/2023 0857   NA 146 (H) 01/05/2023 1114   K 3.7 02/02/2023 0857   CL 105 02/02/2023 0857   CO2 25 02/02/2023 0857   BUN 21 02/02/2023 0857   BUN 11 01/05/2023 1114   CREATININE 1.07 02/02/2023 0857   CREATININE 1.07 08/27/2014 1511      Component Value Date/Time   CALCIUM  9.5 02/02/2023 0857   ALKPHOS 55 02/02/2023 0857   AST 36 02/02/2023 0857   ALT 47 (H) 02/02/2023 0857   BILITOT 0.3 02/02/2023  0857       RADIOGRAPHIC STUDIES:  No results found.   ASSESSMENT/PLAN:  This is a very pleasant 68 year old African-American male with stage IV (T2a, N2, M1 B non-small cell lung cancer, squamous cell carcinoma.  He presented with a left upper lobe lung mass in addition to AP window lymphadenopathy and suspicious for metastases to T8 and L4 vertebrae as well as right retroperitoneal metastatic nodule.  He was diagnosed in April 2024.  His molecular studies by [email or web address] showed no actionable mutation.  His PD-L1 expression is 70%.   He underwent palliative radiation to the metastatic bone lesions.   He also receives Xgeva  every 6 weeks. He is due for this  on 03/16/23.   He is underwent palliative systemic chemotherapy and immunotherapy with carboplatin  for AUC of 5, paclitaxel  175 mg/m, and immunotherapy with Libtayo  350 mg IV every 3 weeks with Neulasta  support.  He is status post 4 cycles.     He started maintenance immunotherapy with Libtayo . He is status post 8 cycles of maintenance.   Labs were reviewed. Recommend he proceed with cycle #9 today as scheduled  I will arrange for a restaging CT of the CAP prior to his next appointment to restage his disease and follow up on the changes from his November scan. The patient and I briefly discussed his late November scan and what we will be following up on.   He will continue taking his iron supplement for anemia.    Will continue taking his blood thinner.  We discussed constipation education.   He will see palliative care today  I encouraged him to increase his protein supplemental drinks to 2x per day.   The patient was advised to call immediately if he has any concerning symptoms in the interval. The patient voices understanding of current disease status and treatment options and is in agreement with the current care plan. All questions were answered. The patient knows to call the clinic with any problems, questions or  concerns. We can certainly see the patient much sooner if necessary         No orders of the defined types were placed in this encounter.   The total time spent in the appointment was 20-29 minutes  Lizzete Gough L Kajuan Guyton, PA-C 02/20/23

## 2023-02-22 ENCOUNTER — Other Ambulatory Visit: Payer: Self-pay | Admitting: Nurse Practitioner

## 2023-02-22 DIAGNOSIS — Z515 Encounter for palliative care: Secondary | ICD-10-CM

## 2023-02-22 DIAGNOSIS — C3492 Malignant neoplasm of unspecified part of left bronchus or lung: Secondary | ICD-10-CM

## 2023-02-22 DIAGNOSIS — G893 Neoplasm related pain (acute) (chronic): Secondary | ICD-10-CM

## 2023-02-22 MED ORDER — MORPHINE SULFATE ER 15 MG PO TBCR: 15.0000 mg | EXTENDED_RELEASE_TABLET | Freq: Two times a day (BID) | ORAL | 0 refills | Status: DC

## 2023-02-23 ENCOUNTER — Inpatient Hospital Stay: Payer: Medicare Other | Attending: Internal Medicine

## 2023-02-23 ENCOUNTER — Encounter: Payer: Self-pay | Admitting: Nurse Practitioner

## 2023-02-23 ENCOUNTER — Inpatient Hospital Stay (HOSPITAL_BASED_OUTPATIENT_CLINIC_OR_DEPARTMENT_OTHER): Payer: Medicare Other | Admitting: Physician Assistant

## 2023-02-23 ENCOUNTER — Inpatient Hospital Stay (HOSPITAL_BASED_OUTPATIENT_CLINIC_OR_DEPARTMENT_OTHER): Payer: Medicare Other | Admitting: Nurse Practitioner

## 2023-02-23 VITALS — BP 135/89 | HR 96 | Temp 97.2°F | Resp 17 | Wt 134.2 lb

## 2023-02-23 DIAGNOSIS — R634 Abnormal weight loss: Secondary | ICD-10-CM

## 2023-02-23 DIAGNOSIS — Z87891 Personal history of nicotine dependence: Secondary | ICD-10-CM | POA: Diagnosis not present

## 2023-02-23 DIAGNOSIS — C3492 Malignant neoplasm of unspecified part of left bronchus or lung: Secondary | ICD-10-CM

## 2023-02-23 DIAGNOSIS — R630 Anorexia: Secondary | ICD-10-CM

## 2023-02-23 DIAGNOSIS — Z79620 Long term (current) use of immunosuppressive biologic: Secondary | ICD-10-CM | POA: Insufficient documentation

## 2023-02-23 DIAGNOSIS — Z5111 Encounter for antineoplastic chemotherapy: Secondary | ICD-10-CM

## 2023-02-23 DIAGNOSIS — Z79899 Other long term (current) drug therapy: Secondary | ICD-10-CM | POA: Diagnosis not present

## 2023-02-23 DIAGNOSIS — C3412 Malignant neoplasm of upper lobe, left bronchus or lung: Secondary | ICD-10-CM | POA: Insufficient documentation

## 2023-02-23 DIAGNOSIS — Z5112 Encounter for antineoplastic immunotherapy: Secondary | ICD-10-CM | POA: Insufficient documentation

## 2023-02-23 DIAGNOSIS — Z515 Encounter for palliative care: Secondary | ICD-10-CM | POA: Diagnosis not present

## 2023-02-23 DIAGNOSIS — G893 Neoplasm related pain (acute) (chronic): Secondary | ICD-10-CM

## 2023-02-23 DIAGNOSIS — C7951 Secondary malignant neoplasm of bone: Secondary | ICD-10-CM | POA: Diagnosis present

## 2023-02-23 LAB — CBC WITH DIFFERENTIAL (CANCER CENTER ONLY)
Abs Immature Granulocytes: 0.01 10*3/uL (ref 0.00–0.07)
Basophils Absolute: 0 10*3/uL (ref 0.0–0.1)
Basophils Relative: 1 %
Eosinophils Absolute: 0 10*3/uL (ref 0.0–0.5)
Eosinophils Relative: 1 %
HCT: 38.9 % — ABNORMAL LOW (ref 39.0–52.0)
Hemoglobin: 12.9 g/dL — ABNORMAL LOW (ref 13.0–17.0)
Immature Granulocytes: 0 %
Lymphocytes Relative: 41 %
Lymphs Abs: 1.8 10*3/uL (ref 0.7–4.0)
MCH: 28.7 pg (ref 26.0–34.0)
MCHC: 33.2 g/dL (ref 30.0–36.0)
MCV: 86.4 fL (ref 80.0–100.0)
Monocytes Absolute: 0.4 10*3/uL (ref 0.1–1.0)
Monocytes Relative: 10 %
Neutro Abs: 2.1 10*3/uL (ref 1.7–7.7)
Neutrophils Relative %: 47 %
Platelet Count: 182 10*3/uL (ref 150–400)
RBC: 4.5 MIL/uL (ref 4.22–5.81)
RDW: 13.4 % (ref 11.5–15.5)
WBC Count: 4.4 10*3/uL (ref 4.0–10.5)
nRBC: 0 % (ref 0.0–0.2)

## 2023-02-23 LAB — CMP (CANCER CENTER ONLY)
ALT: 40 U/L (ref 0–44)
AST: 30 U/L (ref 15–41)
Albumin: 4.7 g/dL (ref 3.5–5.0)
Alkaline Phosphatase: 64 U/L (ref 38–126)
Anion gap: 11 (ref 5–15)
BUN: 24 mg/dL — ABNORMAL HIGH (ref 8–23)
CO2: 26 mmol/L (ref 22–32)
Calcium: 10.1 mg/dL (ref 8.9–10.3)
Chloride: 101 mmol/L (ref 98–111)
Creatinine: 1.13 mg/dL (ref 0.61–1.24)
GFR, Estimated: >60 mL/min (ref >60)
Glucose, Bld: 106 mg/dL — ABNORMAL HIGH (ref 70–99)
Potassium: 3.9 mmol/L (ref 3.5–5.1)
Sodium: 138 mmol/L (ref 135–145)
Total Bilirubin: 0.3 mg/dL (ref 0.0–1.2)
Total Protein: 8.5 g/dL — ABNORMAL HIGH (ref 6.5–8.1)

## 2023-02-23 LAB — TSH: TSH: 1.236 u[IU]/mL (ref 0.350–4.500)

## 2023-02-23 MED ORDER — SODIUM CHLORIDE 0.9 % IV SOLN
350.0000 mg | Freq: Once | INTRAVENOUS | Status: AC
  Administered 2023-02-23: 350 mg via INTRAVENOUS
  Filled 2023-02-23: qty 7

## 2023-02-23 MED ORDER — SODIUM CHLORIDE 0.9 % IV SOLN: Freq: Once | INTRAVENOUS | Status: AC

## 2023-02-23 NOTE — Patient Instructions (Signed)
 [facility] - A DEPT OF [facility]  Discharge Instructions: Thank you for choosing [facility] to provide your oncology and hematology care.   If you have a lab appointment with the Cancer Center, please go directly to the Cancer Center and check in at the registration area.   Wear comfortable clothing and clothing appropriate for easy access to any Portacath or PICC line.   We strive to give you quality time with your provider. You may need to reschedule your appointment if you arrive late (15 or more minutes).  Arriving late affects you and other patients whose appointments are after yours.  Also, if you miss three or more appointments without notifying the office, you may be dismissed from the clinic at the provider's discretion.      For prescription refill requests, have your pharmacy contact our office and allow 72 hours for refills to be completed.    Today you received the following chemotherapy and/or immunotherapy agents: Libtayo      To help prevent nausea and vomiting after your treatment, we encourage you to take your nausea medication as directed.  BELOW ARE SYMPTOMS THAT SHOULD BE REPORTED IMMEDIATELY: *FEVER GREATER THAN 100.4 F (38 C) OR HIGHER *CHILLS OR SWEATING *NAUSEA AND VOMITING THAT IS NOT CONTROLLED WITH YOUR NAUSEA MEDICATION *UNUSUAL SHORTNESS OF BREATH *UNUSUAL BRUISING OR BLEEDING *URINARY PROBLEMS (pain or burning when urinating, or frequent urination) *BOWEL PROBLEMS (unusual diarrhea, constipation, pain near the anus) TENDERNESS IN MOUTH AND THROAT WITH OR WITHOUT PRESENCE OF ULCERS (sore throat, sores in mouth, or a toothache) UNUSUAL RASH, SWELLING OR PAIN  UNUSUAL VAGINAL DISCHARGE OR ITCHING   Items with * indicate a potential emergency and should be followed up as soon as possible or go to the Emergency Department if any problems should occur.  Please show the CHEMOTHERAPY ALERT CARD or IMMUNOTHERAPY  ALERT CARD at check-in to the Emergency Department and triage nurse.  Should you have questions after your visit or need to cancel or reschedule your appointment, please contact [facility] - A DEPT OF [facility]  Dept: [phone]  and follow the prompts.  Office hours are 8:00 a.m. to 4:30 p.m. Monday - Friday. Please note that voicemails left after 4:00 p.m. may not be returned until the following business day.  We are closed weekends and major holidays. You have access to a nurse at all times for urgent questions. Please call the main number to the clinic Dept: [phone] and follow the prompts.   For any non-urgent questions, you may also contact your provider using MyChart. We now offer e-Visits for anyone 21 and older to request care online for non-urgent symptoms. For details visit [email or web address].   Also download the MyChart app! Go to the app store, search "MyChart", open the app, select [place], and log in with your MyChart username and password.  Denosumab Injection (Oncology) What is this medication? DENOSUMAB (den oh SUE mab) prevents weakened bones caused by cancer. It may also be used to treat noncancerous bone tumors that cannot be removed by surgery. It can also be used to treat high calcium levels in the blood caused by cancer. It works by blocking a protein that causes bones to break down quickly. This slows down the release of calcium from bones, which lowers calcium levels in your blood. It also makes your bones stronger and less likely to break (fracture). This  medicine may be used for other purposes; ask your health care provider or pharmacist if you have questions. COMMON BRAND NAME(S): XGEVA What should I tell my care team before I take this medication? They need to know if you have any of these conditions: Dental disease Having surgery or tooth extraction Infection Kidney disease Low levels of calcium or vitamin D in the  blood Malnutrition On hemodialysis Skin conditions or sensitivity Thyroid or parathyroid disease An unusual reaction to denosumab, other medications, foods, dyes, or preservatives Pregnant or trying to get pregnant Breast-feeding How should I use this medication? This medication is for injection under the skin. It is given by your care team in a hospital or clinic setting. A special MedGuide will be given to you before each treatment. Be sure to read this information carefully each time. Talk to your care team about the use of this medication in children. While it may be prescribed for children as young as 13 years for selected conditions, precautions do apply. Overdosage: If you think you have taken too much of this medicine contact a poison control center or emergency room at once. NOTE: This medicine is only for you. Do not share this medicine with others. What if I miss a dose? Keep appointments for follow-up doses. It is important not to miss your dose. Call your care team if you are unable to keep an appointment. What may interact with this medication? Do not take this medication with any of the following: Other medications containing denosumab This medication may also interact with the following: Medications that lower your chance of fighting infection Steroid medications, such as prednisone or cortisone This list may not describe all possible interactions. Give your health care provider a list of all the medicines, herbs, non-prescription drugs, or dietary supplements you use. Also tell them if you smoke, drink alcohol, or use illegal drugs. Some items may interact with your medicine. What should I watch for while using this medication? Your condition will be monitored carefully while you are receiving this medication. You may need blood work while taking this medication. This medication may increase your risk of getting an infection. Call your care team for advice if you get a  fever, chills, sore throat, or other symptoms of a cold or flu. Do not treat yourself. Try to avoid being around people who are sick. You should make sure you get enough calcium and vitamin D while you are taking this medication, unless your care team tells you not to. Discuss the foods you eat and the vitamins you take with your care team. Some people who take this medication have severe bone, joint, or muscle pain. This medication may also increase your risk for jaw problems or a broken thigh bone. Tell your care team right away if you have severe pain in your jaw, bones, joints, or muscles. Tell your care team if you have any pain that does not go away or that gets worse. Talk to your care team if you may be pregnant. Serious birth defects can occur if you take this medication during pregnancy and for 5 months after the last dose. You will need a negative pregnancy test before starting this medication. Contraception is recommended while taking this medication and for 5 months after the last dose. Your care team can help you find the option that works for you. What side effects may I notice from receiving this medication? Side effects that you should report to your care team as soon as  possible: Allergic reactions--skin rash, itching, hives, swelling of the face, lips, tongue, or throat Bone, joint, or muscle pain Low calcium level--muscle pain or cramps, confusion, tingling, or numbness in the hands or feet Osteonecrosis of the jaw--pain, swelling, or redness in the mouth, numbness of the jaw, poor healing after dental work, unusual discharge from the mouth, visible bones in the mouth Side effects that usually do not require medical attention (report to your care team if they continue or are bothersome): Cough Diarrhea Fatigue Headache Nausea This list may not describe all possible side effects. Call your doctor for medical advice about side effects. You may report side effects to FDA at  1-800-FDA-1088. Where should I keep my medication? This medication is given in a hospital or clinic. It will not be stored at home. NOTE: This sheet is a summary. It may not cover all possible information. If you have questions about this medicine, talk to your doctor, pharmacist, or health care provider.  2024 Elsevier/Gold Standard (2021-06-18 00:00:00)

## 2023-02-23 NOTE — Progress Notes (Signed)
 Palliative Medicine [facility]  [phone] [phone]   Name: Albert Hardy. Date: 02/23/2023 MRN: [id number]  DOB: Dec 06, 1955  Patient Care Team: [name], MD as PCP - General (Family Medicine)    INTERVAL HISTORY: Albert Guettler. is a 68 y.o. 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 Palliative ask to see for symptom management and goals of care   SOCIAL HISTORY:     reports that he quit smoking about 11 years ago. His smoking use included cigarettes. He started smoking about 41 years ago. He has a 30 pack-year smoking history. He has never used smokeless tobacco. He reports current alcohol use. He reports current drug use. Frequency: 1.00 time per week. Drug: Marijuana.  ADVANCE DIRECTIVES:  None on file  CODE STATUS: Full code  PAST MEDICAL HISTORY: Past Medical History:  Diagnosis Date   Aortic regurgitation    Aortic stenosis 04/04/2018   Atherosclerosis of native arteries of the extremities with intermittent claudication 09/08/2013   Bilateral impacted cerumen 10/13/2018   Bilateral sensorineural hearing loss 12/14/2018   Dyspnea    Former moderate cigarette smoker (10-19 per day) 05/20/2012   Quit smoking Nov 2013 when hospitalized w/ HTN and chest pain(diagnosed w/ valvular heart disease)   GERD (gastroesophageal reflux disease)    Heart murmur    History of radiation therapy    Lumbar Spine, Thoracic Spine- 06/25/22-07/10/22- Dr. 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  MEDICATIONS:  Current Outpatient Medications  Medication Sig Dispense Refill   albuterol  (VENTOLIN  HFA) 108 (90 Base)  MCG/ACT inhaler Inhale 2 puffs into the lungs every 6 (six) hours as needed for wheezing or shortness of breath. 8 g 6   apixaban  (ELIQUIS ) 5 MG TABS tablet Take 1 tablet (5 mg total) by mouth 2 (two) times daily. 60 tablet 3   Ascorbic Acid (VITAMIN C PO) Take 1 tablet by mouth daily.     Cholecalciferol (VITAMIN D3) 1000 UNITS CAPS Take 1,000 Units by mouth daily.     Coenzyme Q10 (CO Q 10 PO) Take 1 tablet by mouth daily.     Cyanocobalamin (VITAMIN B-12 PO) Take 1 tablet by mouth daily.     cyclobenzaprine (FLEXERIL) 5 MG tablet Take 1 tablet (5 mg total) by mouth 3 (three) times daily as needed for muscle spasms. 30 tablet 0   ezetimibe  (ZETIA ) 10 MG tablet Take 1 tablet (10 mg total) by mouth daily. 90 tablet 3   fluticasone  (FLONASE ) 50 MCG/ACT nasal spray Place 1-2 sprays into both nostrils daily. 16 g 6   KRILL OIL PO Take 1 tablet by mouth daily.     levofloxacin  (LEVAQUIN ) 500 MG tablet Take 1 tablet (500 mg total) by mouth daily. 7 tablet 0   lisinopril -hydrochlorothiazide  (ZESTORETIC ) 10-12.5 MG tablet Take 1 tablet by mouth daily. 90 tablet 1   MAGNESIUM PO Take 1 tablet by mouth daily.     metoCLOPramide  (REGLAN ) 10 MG tablet Take 1 tablet (10 mg total) by mouth 4 (four) times daily -  before meals and at bedtime. 56 tablet 0   metoprolol  tartrate (LOPRESSOR ) 25 MG tablet Take 1  tablet (25 mg total) by mouth once for 1 dose. Please take this medication 2 hours before CT 1 tablet 0   morphine  (MS CONTIN ) 15 MG 12 hr tablet Take 1 tablet (15 mg total) by mouth every 12 (twelve) hours. 60 tablet 0   Multiple Vitamins-Minerals (CENTRUM SILVER PO) Take 1 tablet by mouth daily.     Multiple Vitamins-Minerals (ZINC PO) Take 1 tablet by mouth daily.     Naphazoline-Pheniramine (OPCON-A) 0.027-0.315 % SOLN Place 1 drop into both eyes daily as needed (redness).     nitroGLYCERIN  (NITROSTAT ) 0.4 MG SL tablet Place 1 tablet (0.4 mg total) under the tongue every 5 (five) minutes as needed for  chest pain. 25 tablet 1   omeprazole  (PRILOSEC) 20 MG capsule Take 1 capsule (20 mg total) by mouth daily. 90 capsule 1   oxyCODONE  (OXY IR/ROXICODONE ) 5 MG immediate release tablet Take 1 tablet (5 mg total) by mouth every 4 (four) hours as needed for severe pain (pain score 7-10). 90 tablet 0   potassium chloride  SA (KLOR-CON  M) 20 MEQ tablet Take 1 tablet (20 mEq total) by mouth daily. 6 tablet 0   prochlorperazine  (COMPAZINE ) 10 MG tablet Take 1 tablet (10 mg total) by mouth every 6 (six) hours as needed for nausea or vomiting. 30 tablet 0   rosuvastatin  (CRESTOR ) 5 MG tablet Take 1 tablet (5 mg total) by mouth at bedtime. 90 tablet 1   TURMERIC PO Take 1 tablet by mouth 3 (three) times a week.     umeclidinium-vilanterol (ANORO ELLIPTA ) 62.5-25 MCG/ACT AEPB Inhale 1 puff into the lungs daily. 180 each 3   No current facility-administered medications for this visit.    VITAL SIGNS: There were no vitals taken for this visit. There were no vitals filed for this visit.  Estimated body mass index is 19.88 kg/m as calculated from the following:   Height as of 02/17/23: 5' 9 (1.753 m).   Weight as of 02/17/23: 134 lb 9.6 oz (61.1 kg).     Latest Ref Rng & Units 02/23/2023    9:02 AM 02/02/2023    8:57 AM 01/12/2023    8:15 AM  CBC  WBC 4.0 - 10.5 K/uL 4.4  3.8  3.1   Hemoglobin 13.0 - 17.0 g/dL 87.0  87.6  89.3   Hematocrit 39.0 - 52.0 % 38.9  37.3  33.1   Platelets 150 - 400 K/uL 182  143  193        Latest Ref Rng & Units 02/02/2023    8:57 AM 01/12/2023    8:15 AM 01/05/2023   11:14 AM  CMP  Glucose 70 - 99 mg/dL 884  898  76   BUN 8 - 23 mg/dL 21  13  11    Creatinine 0.61 - 1.24 mg/dL 8.92  8.96  9.12   Sodium 135 - 145 mmol/L 140  144  146   Potassium 3.5 - 5.1 mmol/L 3.7  3.6  3.6   Chloride 98 - 111 mmol/L 105  108  109   CO2 22 - 32 mmol/L 25  29  22    Calcium  8.9 - 10.3 mg/dL 9.5  8.7  8.4   Total Protein 6.5 - 8.1 g/dL 8.0  6.7    Total Bilirubin <1.2 mg/dL 0.3  0.3     Alkaline Phos 38 - 126 U/L 55  59    AST 15 - 41 U/L 36  26    ALT 0 - 44 U/L  47  38      PERFORMANCE STATUS (ECOG) : 1 - Symptomatic but completely ambulatory   Physical Exam General: NAD Cardiovascular: RRR Pulmonary: normal breathing pattern  Extremities: no edema, no joint deformities Skin: no rashes Neurological: AAO x4  IMPRESSION:  Albert Hardy present to clinic for symptom management follow-up. No acute distress. Denies nausea, vomiting, or diarrhea. However, he has experienced constipation, which he attributes to a lapse in taking his Senna. He plans to resume this medication. Continues to take things one day at a time. Occasional fatigue however tries to remain as active as possible.   The patient's appetite is reported as decreased, with meals typically consisting of two per day rather than three. Despite efforts to increase caloric intake, including the use of protein supplements, the patient has experienced a weight loss of approximately seven pounds since the beginning of December. He expresses a desire to regain weight, with a goal of reaching 150 pounds. He has been trialing a protein supplement from [employer] but reports dissatisfaction with the taste. He has been advised to try consuming two servings per day to aid in weight gain. He expresses a willingness to try different flavors of the supplement in an effort to improve his weight.   Albert Hardy notes that his pain is well-controlled as long as he adheres to his medication regimen. He currently taking MS Contin  every 12 hours. Does not require frequent use of breakthrough medication. No changes to regimen at this time.   All questions answered and support provided.  Goals of Care   06/18/22-  We discussed his current illness and what it means in the larger context of hsi on-going co-morbidities. Natural disease trajectory and expectations were discussed.  Mr. Glander and his wife are realistic in their expectations and  understanding of his incurable cancer. He knows all treatment is palliative focused. Wishes to continue taking things one day at a time focusing on his quality of life allowing him every opportunity to continue to thrive.   We discussed Her current illness and what it means in the larger context of Her on-going co-morbidities. Natural disease trajectory and expectations were discussed.  I discussed the importance of continued conversation with family and their medical providers regarding overall plan of care and treatment options, ensuring decisions are within the context of the patients values and GOCs.  Assessment and Plan  Unintentional Weight Loss Decreased appetite and weight loss of 7 pounds over the past month. Patient is trying to increase caloric intake and is using Premier Protein shakes. -Encouraged to continue with protein shakes and aim for at least two meals a day. -Consider increasing to two protein shakes a day.  Constipation Constipation reported, possibly related to medication. -Advised to resume [place]. -Continue monitoring bowel movements and report any changes.  Pain Management Pain is well-controlled with current medication regimen. -Continue current pain medication as prescribed. MS Contin  15mg  every 12 hours. Oxycodone  5mg  every 6 hours as needed for breakthrough. Does not require frequently with last refill in November.   General Health Maintenance -Refilled current medications. -Encouraged to maintain regular eating habits and monitor weight. -I will plan to see patient back in 4-6 weeks.  Patient expressed understanding and was in agreement with this plan. He also understands that He can call the clinic at any time with any questions, concerns, or complaints.   Any controlled substances utilized were prescribed in the context of palliative care. PDMP has been reviewed.    Visit consisted  of counseling and education dealing with the complex and emotionally  intense issues of symptom management and palliative care in the setting of serious and potentially life-threatening illness.  Levon Borer, AGPCNP-BC  Palliative Medicine Team/[facility]

## 2023-02-24 LAB — T4: T4, Total: 8.7 ug/dL (ref 4.5–12.0)

## 2023-02-25 ENCOUNTER — Other Ambulatory Visit: Payer: Self-pay

## 2023-02-25 ENCOUNTER — Telehealth: Payer: Self-pay

## 2023-02-25 DIAGNOSIS — Z515 Encounter for palliative care: Secondary | ICD-10-CM

## 2023-02-25 DIAGNOSIS — G893 Neoplasm related pain (acute) (chronic): Secondary | ICD-10-CM

## 2023-02-25 DIAGNOSIS — C3492 Malignant neoplasm of unspecified part of left bronchus or lung: Secondary | ICD-10-CM

## 2023-02-25 MED ORDER — MORPHINE SULFATE ER 15 MG PO TBCR: 15.0000 mg | EXTENDED_RELEASE_TABLET | Freq: Two times a day (BID) | ORAL | 0 refills | Status: DC

## 2023-02-25 NOTE — Telephone Encounter (Signed)
Pt MS Contin needed a prior authorization to receive a full 30 day supply. The office was not made aware of the need for a PA, the patient was out of his medication and decided to pick up the 7 days of the medication covered by his insurance. This RN spoke with pharmacist from pt preferred pharmacy who explained the situation as well as the need for another order to be sent in (they are unable to give partial fills due to the controlled nature of this prescription). See associated orders for the refill, this RN to reach out to PA team to work on PA for this pt.

## 2023-02-25 NOTE — Telephone Encounter (Signed)
Notified Patient and Pharmacy of prior authorization approval for Morphine Sulfate Er 15 mg (MS Contin) Tablets. Medication is approved until further notice. No other needs or concerns noted at this time.

## 2023-03-01 ENCOUNTER — Telehealth (HOSPITAL_COMMUNITY): Payer: Self-pay | Admitting: *Deleted

## 2023-03-01 NOTE — Telephone Encounter (Signed)
Reaching out to patient to offer assistance regarding upcoming cardiac imaging study; pt verbalizes understanding of appt date/time, parking situation and where to check in, pre-test NPO status, and verified current allergies; name and call back number provided for further questions should they arise  Larey Brick RN Navigator Cardiac Imaging [facility] [phone] office [phone] cell  Patient reports HR of 63.

## 2023-03-02 ENCOUNTER — Encounter (HOSPITAL_BASED_OUTPATIENT_CLINIC_OR_DEPARTMENT_OTHER): Payer: Self-pay

## 2023-03-02 ENCOUNTER — Ambulatory Visit (HOSPITAL_BASED_OUTPATIENT_CLINIC_OR_DEPARTMENT_OTHER)
Admission: RE | Admit: 2023-03-02 | Discharge: 2023-03-02 | Disposition: A | Discharge status: Home / Self Care | Payer: Medicare Other | Source: Ambulatory Visit | Attending: Cardiology | Admitting: Cardiology

## 2023-03-02 DIAGNOSIS — R072 Precordial pain: Secondary | ICD-10-CM | POA: Diagnosis present

## 2023-03-02 MED ORDER — NITROGLYCERIN 0.4 MG SL SUBL
0.8000 mg | SUBLINGUAL_TABLET | Freq: Once | SUBLINGUAL | Status: AC
  Administered 2023-03-02: 0.8 mg via SUBLINGUAL

## 2023-03-02 MED ORDER — IOHEXOL 350 MG/ML SOLN
100.0000 mL | Freq: Once | INTRAVENOUS | Status: AC | PRN
  Administered 2023-03-02: 95 mL via INTRAVENOUS

## 2023-03-02 NOTE — Progress Notes (Signed)
Patient denies symptoms after cardiac CT scan.

## 2023-03-09 ENCOUNTER — Ambulatory Visit (HOSPITAL_COMMUNITY)
Admission: RE | Admit: 2023-03-09 | Discharge: 2023-03-09 | Disposition: A | Discharge status: Home / Self Care | Payer: Medicare Other | Source: Ambulatory Visit | Attending: Physician Assistant | Admitting: Physician Assistant

## 2023-03-09 DIAGNOSIS — C3492 Malignant neoplasm of unspecified part of left bronchus or lung: Secondary | ICD-10-CM | POA: Insufficient documentation

## 2023-03-09 MED ORDER — IOHEXOL 300 MG/ML  SOLN
100.0000 mL | Freq: Once | INTRAMUSCULAR | Status: AC | PRN
  Administered 2023-03-09: 100 mL via INTRAVENOUS

## 2023-03-12 ENCOUNTER — Other Ambulatory Visit: Payer: Self-pay

## 2023-03-12 ENCOUNTER — Encounter (HOSPITAL_COMMUNITY): Payer: Self-pay

## 2023-03-12 ENCOUNTER — Emergency Department (HOSPITAL_COMMUNITY): Payer: Medicare Other

## 2023-03-12 ENCOUNTER — Emergency Department (HOSPITAL_COMMUNITY)
Admission: EM | Admit: 2023-03-12 | Discharge: 2023-03-13 | Discharge status: Against Medical Advice | Payer: Medicare Other | Attending: Physician Assistant | Admitting: Physician Assistant

## 2023-03-12 DIAGNOSIS — R053 Chronic cough: Secondary | ICD-10-CM | POA: Diagnosis not present

## 2023-03-12 DIAGNOSIS — Z5321 Procedure and treatment not carried out due to patient leaving prior to being seen by health care provider: Secondary | ICD-10-CM | POA: Insufficient documentation

## 2023-03-12 DIAGNOSIS — Z7901 Long term (current) use of anticoagulants: Secondary | ICD-10-CM | POA: Diagnosis not present

## 2023-03-12 DIAGNOSIS — R079 Chest pain, unspecified: Secondary | ICD-10-CM | POA: Diagnosis present

## 2023-03-12 DIAGNOSIS — C3490 Malignant neoplasm of unspecified part of unspecified bronchus or lung: Secondary | ICD-10-CM | POA: Diagnosis not present

## 2023-03-12 DIAGNOSIS — Z85118 Personal history of other malignant neoplasm of bronchus and lung: Secondary | ICD-10-CM | POA: Insufficient documentation

## 2023-03-12 DIAGNOSIS — Z20822 Contact with and (suspected) exposure to covid-19: Secondary | ICD-10-CM | POA: Insufficient documentation

## 2023-03-12 HISTORY — DX: Malignant (primary) neoplasm, unspecified: C80.1

## 2023-03-12 LAB — CBC WITH DIFFERENTIAL/PLATELET
Abs Immature Granulocytes: 0 10*3/uL (ref 0.00–0.07)
Basophils Absolute: 0 10*3/uL (ref 0.0–0.1)
Basophils Relative: 0 %
Eosinophils Absolute: 0 10*3/uL (ref 0.0–0.5)
Eosinophils Relative: 1 %
HCT: 35.7 % — ABNORMAL LOW (ref 39.0–52.0)
Hemoglobin: 11.7 g/dL — ABNORMAL LOW (ref 13.0–17.0)
Immature Granulocytes: 0 %
Lymphocytes Relative: 35 %
Lymphs Abs: 1.3 10*3/uL (ref 0.7–4.0)
MCH: 28.7 pg (ref 26.0–34.0)
MCHC: 32.8 g/dL (ref 30.0–36.0)
MCV: 87.5 fL (ref 80.0–100.0)
Monocytes Absolute: 0.4 10*3/uL (ref 0.1–1.0)
Monocytes Relative: 10 %
Neutro Abs: 2 10*3/uL (ref 1.7–7.7)
Neutrophils Relative %: 54 %
Platelets: 179 10*3/uL (ref 150–400)
RBC: 4.08 MIL/uL — ABNORMAL LOW (ref 4.22–5.81)
RDW: 13.6 % (ref 11.5–15.5)
WBC: 3.8 10*3/uL — ABNORMAL LOW (ref 4.0–10.5)
nRBC: 0 % (ref 0.0–0.2)

## 2023-03-12 NOTE — ED Provider Triage Note (Cosign Needed)
Emergency Medicine Provider Triage Evaluation Note  Albert Hardy. , a 68 y.o. male  was evaluated in triage.  Pt complains of Cp on left. Started 1.5 hours PTA. No radiation. Worse with deep breathing. Recent CT coronary scan which did show worsening changes to left. Know CA. On Eliquis, compliant with meds. Has chronic cough. No fever. No sick contacts  Review of Systems  Positive: CP Negative: Fever  Physical Exam  BP 108/68 (BP Location: Left Arm)   Pulse 96   Temp 98.4 F (36.9 C) (Oral)   Resp 16   Wt 59.4 kg   SpO2 100%   BMI 19.35 kg/m  Gen:   Awake, no distress   Resp:  Normal effort  MSK:   Moves extremities without difficulty  Other:    Medical Decision Making  Medically screening exam initiated at 11:36 PM.  Appropriate orders placed.  [name] was informed that the remainder of the evaluation will be completed by another provider, this initial triage assessment does not replace that evaluation, and the importance of remaining in the ED until their evaluation is complete.  CP   Raina Sole A, PA-C 03/12/23 [id number]

## 2023-03-12 NOTE — ED Triage Notes (Signed)
POV ambulatory/ CP x1 hour PTA/ pt took 2 nitroglycerin and feels better/ pt is on immunotherapy for stage 4 lung cancer/ pt is A&Ox4

## 2023-03-13 LAB — BASIC METABOLIC PANEL
Anion gap: 10 (ref 5–15)
BUN: 27 mg/dL — ABNORMAL HIGH (ref 8–23)
CO2: 23 mmol/L (ref 22–32)
Calcium: 9.5 mg/dL (ref 8.9–10.3)
Chloride: 100 mmol/L (ref 98–111)
Creatinine, Ser: 1.16 mg/dL (ref 0.61–1.24)
GFR, Estimated: >60 mL/min (ref >60)
Glucose, Bld: 123 mg/dL — ABNORMAL HIGH (ref 70–99)
Potassium: 4.1 mmol/L (ref 3.5–5.1)
Sodium: 133 mmol/L — ABNORMAL LOW (ref 135–145)

## 2023-03-13 LAB — RESP PANEL BY RT-PCR (RSV, FLU A&B, COVID)  RVPGX2
Influenza A by PCR: NEGATIVE
Influenza B by PCR: NEGATIVE
Resp Syncytial Virus by PCR: NEGATIVE
SARS Coronavirus 2 by RT PCR: NEGATIVE

## 2023-03-13 LAB — TROPONIN I (HIGH SENSITIVITY): Troponin I (High Sensitivity): 5 ng/L (ref <18)

## 2023-03-13 NOTE — ED Notes (Signed)
 Pt eloped - MD made aware.

## 2023-03-16 ENCOUNTER — Inpatient Hospital Stay (HOSPITAL_BASED_OUTPATIENT_CLINIC_OR_DEPARTMENT_OTHER): Payer: Medicare Other | Admitting: Internal Medicine

## 2023-03-16 ENCOUNTER — Inpatient Hospital Stay: Payer: Medicare Other

## 2023-03-16 ENCOUNTER — Inpatient Hospital Stay: Payer: Medicare Other | Attending: Internal Medicine

## 2023-03-16 VITALS — BP 114/71 | HR 78 | Temp 98.1°F | Resp 17 | Ht 69.0 in | Wt 132.3 lb

## 2023-03-16 VITALS — BP 111/70 | HR 89 | Temp 99.2°F | Resp 16 | Wt 132.2 lb

## 2023-03-16 DIAGNOSIS — C3492 Malignant neoplasm of unspecified part of left bronchus or lung: Secondary | ICD-10-CM | POA: Diagnosis not present

## 2023-03-16 DIAGNOSIS — C7951 Secondary malignant neoplasm of bone: Secondary | ICD-10-CM | POA: Insufficient documentation

## 2023-03-16 DIAGNOSIS — Z87891 Personal history of nicotine dependence: Secondary | ICD-10-CM | POA: Diagnosis not present

## 2023-03-16 DIAGNOSIS — C3412 Malignant neoplasm of upper lobe, left bronchus or lung: Secondary | ICD-10-CM | POA: Insufficient documentation

## 2023-03-16 DIAGNOSIS — Z5112 Encounter for antineoplastic immunotherapy: Secondary | ICD-10-CM | POA: Insufficient documentation

## 2023-03-16 DIAGNOSIS — Z79620 Long term (current) use of immunosuppressive biologic: Secondary | ICD-10-CM | POA: Insufficient documentation

## 2023-03-16 LAB — CBC WITH DIFFERENTIAL (CANCER CENTER ONLY)
Abs Immature Granulocytes: 0 10*3/uL (ref 0.00–0.07)
Basophils Absolute: 0 10*3/uL (ref 0.0–0.1)
Basophils Relative: 0 %
Eosinophils Absolute: 0.1 10*3/uL (ref 0.0–0.5)
Eosinophils Relative: 2 %
HCT: 35.2 % — ABNORMAL LOW (ref 39.0–52.0)
Hemoglobin: 11.8 g/dL — ABNORMAL LOW (ref 13.0–17.0)
Immature Granulocytes: 0 %
Lymphocytes Relative: 38 %
Lymphs Abs: 1.4 10*3/uL (ref 0.7–4.0)
MCH: 28.4 pg (ref 26.0–34.0)
MCHC: 33.5 g/dL (ref 30.0–36.0)
MCV: 84.8 fL (ref 80.0–100.0)
Monocytes Absolute: 0.4 10*3/uL (ref 0.1–1.0)
Monocytes Relative: 9 %
Neutro Abs: 1.9 10*3/uL (ref 1.7–7.7)
Neutrophils Relative %: 51 %
Platelet Count: 167 10*3/uL (ref 150–400)
RBC: 4.15 MIL/uL — ABNORMAL LOW (ref 4.22–5.81)
RDW: 13.4 % (ref 11.5–15.5)
WBC Count: 3.7 10*3/uL — ABNORMAL LOW (ref 4.0–10.5)
nRBC: 0 % (ref 0.0–0.2)

## 2023-03-16 LAB — CMP (CANCER CENTER ONLY)
ALT: 24 U/L (ref 0–44)
AST: 21 U/L (ref 15–41)
Albumin: 4.4 g/dL (ref 3.5–5.0)
Alkaline Phosphatase: 52 U/L (ref 38–126)
Anion gap: 9 (ref 5–15)
BUN: 20 mg/dL (ref 8–23)
CO2: 26 mmol/L (ref 22–32)
Calcium: 9.3 mg/dL (ref 8.9–10.3)
Chloride: 100 mmol/L (ref 98–111)
Creatinine: 1.18 mg/dL (ref 0.61–1.24)
GFR, Estimated: >60 mL/min (ref >60)
Glucose, Bld: 103 mg/dL — ABNORMAL HIGH (ref 70–99)
Potassium: 4 mmol/L (ref 3.5–5.1)
Sodium: 135 mmol/L (ref 135–145)
Total Bilirubin: 0.5 mg/dL (ref 0.0–1.2)
Total Protein: 7.6 g/dL (ref 6.5–8.1)

## 2023-03-16 LAB — TSH: TSH: 1.188 u[IU]/mL (ref 0.350–4.500)

## 2023-03-16 MED ORDER — SODIUM CHLORIDE 0.9 % IV SOLN: Freq: Once | INTRAVENOUS | Status: AC

## 2023-03-16 MED ORDER — SODIUM CHLORIDE 0.9 % IV SOLN
350.0000 mg | Freq: Once | INTRAVENOUS | Status: AC
  Administered 2023-03-16: 350 mg via INTRAVENOUS
  Filled 2023-03-16: qty 7

## 2023-03-16 MED ORDER — DENOSUMAB 120 MG/1.7ML ~~LOC~~ SOLN
120.0000 mg | Freq: Once | SUBCUTANEOUS | Status: AC
  Administered 2023-03-16: 120 mg via SUBCUTANEOUS
  Filled 2023-03-16: qty 1.7

## 2023-03-16 NOTE — Progress Notes (Signed)
 [facility] Telephone:([phone]) [phone]   Fax:([phone]) [phone]  OFFICE PROGRESS NOTE  [name], MD [phone] [place] [address]  DIAGNOSIS: Stage IV (T2a, N2, M1 B) non-small cell lung cancer, squamous cell carcinoma presented with left upper lobe lung mass in addition to AP window lymphadenopathy and suspicious bone metastasis to the T8 and L4 vertebrae in addition to retroperitoneal metastatic soft tissue nodule diagnosed in April 2024.   Molecular studies by [email or web address] showed no actionable mutations and PD-L1 expression of 70%.  PRIOR THERAPY:  1) Palliative radiotherapy to the T8 and L4 metastatic bone disease. 2) ystemic chemotherapy with carboplatin  for AUC of 5, paclitaxel  175 Mg/M2 and Libtayo  (Cempilimab) 350 Mg IV every 3 weeks with Neulasta  support for 4 cycles    CURRENT THERAPY: Maintenance treatment with single agent Libtayo  (Cempilimab) 350 Mg IV every 3 weeks.  First dose September 08, 2022.  Status post 9 cycles.  INTERVAL HISTORY: Albert Hardy. 68 y.o. male returns to the clinic today for follow-up visit.Discussed the use of AI scribe software for clinical note transcription with the patient, who gave verbal consent to proceed.  History of Present Illness   Albert Hardy. is a 68 year old male with squamous cell carcinoma who presents for follow-up of his cancer treatment.  He has a history of squamous cell carcinoma, initially diagnosed in April 2024. Treatment has included chemotherapy with carboplatin  and paclitaxel , followed by maintenance immunotherapy with cemiplimab  every three weeks. He has completed four cycles of chemotherapy and nine cycles of immunotherapy to date.  He experiences lower back pain, which is currently managed with oxycodone  and morphine . The pain medication seems to wear off sooner than before, requiring more frequent dosing. He is on 15 mg of morphine  every twelve hours, managed by the palliative care  team.  He notices increased awareness of his breathing but it does not restrict his activities. He coughs up mostly clear to yellowish sputum, sometimes with blood. No recent weight loss, with his weight remaining steady.        MEDICAL HISTORY: Past Medical History:  Diagnosis Date   Aortic regurgitation    Aortic stenosis 04/04/2018   Atherosclerosis of native arteries of the extremities with intermittent claudication 09/08/2013   Bilateral impacted cerumen 10/13/2018   Bilateral sensorineural hearing loss 12/14/2018   Cancer (HCC)    lung   Dyspnea    Former moderate cigarette smoker (10-19 per day) 05/20/2012   Quit smoking Nov 2013 when hospitalized w/ HTN and chest pain(diagnosed w/ valvular heart disease)   GERD (gastroesophageal reflux disease)    Heart murmur    History of radiation therapy    Lumbar Spine, Thoracic Spine- 06/25/22-07/10/22- Dr. 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  MEDICATIONS:  Current Outpatient Medications  Medication Sig Dispense Refill   albuterol  (VENTOLIN  HFA) 108 (90 Base) MCG/ACT inhaler Inhale 2 puffs into the lungs every 6 (six) hours as needed for wheezing or shortness of breath. 8 g 6   apixaban  (ELIQUIS ) 5 MG TABS tablet Take 1 tablet (5 mg total) by mouth 2 (two) times daily. 60 tablet 3   Ascorbic Acid (VITAMIN  C PO) Take 1 tablet by mouth daily.     Cholecalciferol (VITAMIN D3) 1000 UNITS CAPS Take 1,000 Units by mouth daily.     Coenzyme Q10 (CO Q 10 PO) Take 1 tablet by mouth daily.     Cyanocobalamin (VITAMIN B-12 PO) Take 1 tablet by mouth daily.     cyclobenzaprine (FLEXERIL) 5 MG tablet Take 1 tablet (5 mg total) by mouth 3 (three) times daily as needed for muscle spasms. 30  tablet 0   ezetimibe  (ZETIA ) 10 MG tablet Take 1 tablet (10 mg total) by mouth daily. 90 tablet 3   fluticasone  (FLONASE ) 50 MCG/ACT nasal spray Place 1-2 sprays into both nostrils daily. 16 g 6   KRILL OIL PO Take 1 tablet by mouth daily.     levofloxacin  (LEVAQUIN ) 500 MG tablet Take 1 tablet (500 mg total) by mouth daily. 7 tablet 0   lisinopril -hydrochlorothiazide  (ZESTORETIC ) 10-12.5 MG tablet Take 1 tablet by mouth daily. 90 tablet 1   MAGNESIUM PO Take 1 tablet by mouth daily.     metoCLOPramide  (REGLAN ) 10 MG tablet Take 1 tablet (10 mg total) by mouth 4 (four) times daily -  before meals and at bedtime. 56 tablet 0   metoprolol  tartrate (LOPRESSOR ) 25 MG tablet Take 1 tablet (25 mg total) by mouth once for 1 dose. Please take this medication 2 hours before CT 1 tablet 0   morphine  (MS CONTIN ) 15 MG 12 hr tablet Take 1 tablet (15 mg total) by mouth every 12 (twelve) hours. 60 tablet 0   Multiple Vitamins-Minerals (CENTRUM SILVER PO) Take 1 tablet by mouth daily.     Multiple Vitamins-Minerals (ZINC PO) Take 1 tablet by mouth daily.     Naphazoline-Pheniramine (OPCON-A) 0.027-0.315 % SOLN Place 1 drop into both eyes daily as needed (redness).     nitroGLYCERIN  (NITROSTAT ) 0.4 MG SL tablet Place 1 tablet (0.4 mg total) under the tongue every 5 (five) minutes as needed for chest pain. 25 tablet 1   omeprazole  (PRILOSEC) 20 MG capsule Take 1 capsule (20 mg total) by mouth daily. 90 capsule 1   oxyCODONE  (OXY IR/ROXICODONE ) 5 MG immediate release tablet Take 1 tablet (5 mg total) by mouth every 4 (four) hours as needed for severe pain (pain score 7-10). 90 tablet 0   potassium chloride  SA (KLOR-CON  M) 20 MEQ tablet Take 1 tablet (20 mEq total) by mouth daily. 6 tablet 0   prochlorperazine  (COMPAZINE ) 10 MG tablet Take 1 tablet (10 mg total) by mouth every 6 (six) hours as needed for nausea or vomiting. 30 tablet 0   rosuvastatin  (CRESTOR ) 5 MG tablet Take 1 tablet (5 mg total) by mouth at  bedtime. 90 tablet 1   TURMERIC PO Take 1 tablet by mouth 3 (three) times a week.     umeclidinium-vilanterol (ANORO ELLIPTA ) 62.5-25 MCG/ACT AEPB Inhale 1 puff into the lungs daily. 180 each 3   No current facility-administered medications for this visit.    SURGICAL HISTORY:  Past Surgical History:  Procedure Laterality Date   BRONCHIAL BIOPSY  05/26/2022   Procedure: BRONCHIAL BIOPSIES;  Surgeon: Brenna Adine CROME, DO;  Location: [facility];  Service: Pulmonary;;   BRONCHIAL BRUSHINGS  05/26/2022   Procedure: BRONCHIAL BRUSHINGS;  Surgeon: Brenna Adine CROME, DO;  Location: [facility];  Service: Pulmonary;;   NO PAST SURGERIES     VIDEO BRONCHOSCOPY  05/26/2022   Procedure: VIDEO BRONCHOSCOPY WITHOUT FLUORO;  Surgeon: Brenna Adine CROME, DO;  Location: [facility];  Service: Pulmonary;;    REVIEW OF SYSTEMS:  Constitutional: negative Eyes: negative Ears, nose, mouth, throat, and face: negative Respiratory: positive for cough and sputum Cardiovascular: negative Gastrointestinal: negative Genitourinary:negative Integument/breast: negative Hematologic/lymphatic: negative Musculoskeletal:positive for arthralgias and back pain Neurological: negative Behavioral/Psych: negative Endocrine: negative Allergic/Immunologic: negative   PHYSICAL EXAMINATION: General appearance: alert, cooperative, and no distress Head: Normocephalic, without obvious abnormality, atraumatic Neck: no adenopathy, no JVD, supple, symmetrical, trachea midline, and thyroid  not enlarged, symmetric, no tenderness/mass/nodules Lymph nodes: Cervical, supraclavicular, and axillary nodes normal. Resp: clear to auscultation bilaterally Back: symmetric, no curvature. ROM normal. No CVA tenderness. Cardio: regular rate and rhythm, S1, S2 normal, no murmur, click, rub or gallop GI: soft, non-tender; bowel sounds normal; no masses,  no organomegaly Extremities: extremities normal, atraumatic, no cyanosis or  edema Neurologic: Alert and oriented X 3, normal strength and tone. Normal symmetric reflexes. Normal coordination and gait  ECOG PERFORMANCE STATUS: 1 - Symptomatic but completely ambulatory  Blood pressure 114/71, pulse 78, temperature 98.1 F (36.7 C), resp. rate 17, height 5' 9 (1.753 m), weight 132 lb 4.8 oz (60 kg), SpO2 100%.  LABORATORY DATA: Lab Results  Component Value Date   WBC 3.7 (L) 03/16/2023   HGB 11.8 (L) 03/16/2023   HCT 35.2 (L) 03/16/2023   MCV 84.8 03/16/2023   PLT 167 03/16/2023      Chemistry      Component Value Date/Time   NA 133 (L) 03/12/2023 2330   NA 146 (H) 01/05/2023 1114   K 4.1 03/12/2023 2330   CL 100 03/12/2023 2330   CO2 23 03/12/2023 2330   BUN 27 (H) 03/12/2023 2330   BUN 11 01/05/2023 1114   CREATININE 1.16 03/12/2023 2330   CREATININE 1.13 02/23/2023 0902   CREATININE 1.07 08/27/2014 1511      Component Value Date/Time   CALCIUM  9.5 03/12/2023 2330   ALKPHOS 64 02/23/2023 0902   AST 30 02/23/2023 0902   ALT 40 02/23/2023 0902   BILITOT 0.3 02/23/2023 0902       RADIOGRAPHIC STUDIES: DG Chest 2 View Result Date: 03/12/2023 CLINICAL DATA:  Chest pain EXAM: CHEST - 2 VIEW COMPARISON:  08/25/2022 FINDINGS: The heart size and mediastinal contours are within normal limits. Both lungs are clear. The visualized skeletal structures are unremarkable. IMPRESSION: No active cardiopulmonary disease. Electronically Signed   By: Franky Stanford M.D.   On: 03/12/2023 23:59   CT CORONARY MORPH W/CTA COR W/SCORE W/CA W/CM &/OR WO/CM Addendum Date: 03/12/2023 ADDENDUM REPORT: 03/12/2023 03:49 EXAM: OVER-READ INTERPRETATION  CT CHEST The following report is an over-read performed by radiologist Dr. Greig Ferraris [facility] on 03/12/2023. This over-read does not include interpretation of cardiac or coronary anatomy or pathology. The coronary CTA interpretation by the cardiologist is attached. COMPARISON:  CT chest abdomen and pelvis  01/06/2023 FINDINGS: There is a stable enlarged left hilar lymph node measuring 14 mm. There is a new enlarged left hilar lymph node measuring 13 mm. Visualized esophagus is within normal limits. Minimal tree-in-bud opacities in tiny ill-defined cystic lesions are again noted in the left upper lobe, mildly increased from prior. Visualized upper abdomen is within normal limits. No acute osseous abnormality. IMPRESSION: 1. Minimal tree-in-bud opacities and tiny ill-defined cystic lesions in the left upper lobe, mildly increased from prior. Findings are favored to be infectious/inflammatory. 2. Increasing left hilar lymphadenopathy. 3. Continued follow-up recommended to ensure resolution. Electronically Signed   By: Greig Pique M.D.   On: 03/12/2023 03:49  Result Date: 03/12/2023 CLINICAL DATA:  CP EXAM: Cardiac/Coronary  CTA TECHNIQUE: The patient was scanned on a GE Apex scanner. FINDINGS: A 100 kV prospective scan was triggered in the descending thoracic aorta at 111 HU's. Axial non-contrast 3 mm slices were carried out through the heart. The data set was analyzed on a dedicated work station and scored using the Agatson method. Gantry rotation speed was 250 msecs and collimation was .6 mm. No beta blockade and 0.8 mg of sl NTG was given. The 3D data set was reconstructed at 75 % of the R-R cycle. Diastolic phases were analyzed on a dedicated work station using MPR, MIP and VRT modes. The patient received 80 cc of contrast. Aorta: Normal size. Mild calcifications of the aortic arch noted. No dissection. Aortic Valve:  Trileaflet.  Mild calcifications. Coronary Arteries:  Normal coronary origin.  Right dominance. RCA is a small, non dominant artery.  There is no plaque. Left main is a large artery that gives rise to LAD and LCX arteries as well as large Intermediate Branch. LAD is a large vessel that has no plaque. This artery gives rise to moderate size D1 and D2, free of disease. Intermediate Branch is free of  disease. LCX is a dominant artery that gives rise to one large OM1 and OM2 branches as well as that gives rise to PDA and PLA. There is no plaque. Other findings: Normal pulmonary vein drainage into the left atrium. Common left pulmonary vein trunk noted, normal variant. Normal left atrial appendage without a thrombus. Normal size of the pulmonary artery. IMPRESSION: 1. Coronary calcium  score of 0. This was <1 percentile for age and sex matched control. 2. Normal coronary origin with left dominance. 3. CAD-RADS 0. No evidence of CAD (0%). Consider non-atherosclerotic causes of chest pain. Electronically Signed: By: Lamar Fitch M.D. On: 03/04/2023 10:47   CT CHEST ABDOMEN PELVIS W CONTRAST Result Date: 03/09/2023 CLINICAL DATA:  Metastatic lung cancer, restaging. * Tracking Code: BO * EXAM: CT CHEST, ABDOMEN, AND PELVIS WITH CONTRAST TECHNIQUE: Multidetector CT imaging of the chest, abdomen and pelvis was performed following the standard protocol during bolus administration of intravenous contrast. RADIATION DOSE REDUCTION: This exam was performed according to the departmental dose-optimization program which includes automated exposure control, adjustment of the mA and/or kV according to patient size and/or use of iterative reconstruction technique. CONTRAST:  OMNIPAQUE  IOHEXOL  300 MG/ML  SOLN COMPARISON:  Multiple priors including most recent CT chest abdomen and pelvis January 06, 2023. FINDINGS: CT CHEST FINDINGS Cardiovascular: No significant vascular findings. Normal heart size. No pericardial effusion. Mediastinum/Nodes: Similar size of left hilar lymph nodes with the previously indexed lymph node measuring 22 x 18 mm on image 31/2 previously 23 x 18 mm. No suspicious thyroid  nodule. The esophagus is grossly unremarkable. Lungs/Pleura: Left hilar mass causes central obstruction of the left upper lobe bronchus with progressive postobstructive bronchial plugging. Numerous small ground-glass  nodules seen throughout the left upper lobe are progressive from prior examination and some of which are now centrally cavitary. Musculoskeletal: Similar appearance of the mixed lytic and sclerotic osseous metastasis at T8. CT ABDOMEN PELVIS FINDINGS Hepatobiliary: No aggressive lytic or blastic lesion of bone. Gallbladder is unremarkable. No biliary ductal dilation. Pancreas: Pancreatic ductal dilation or evidence of acute inflammation. Spleen: No splenomegaly. Adrenals/Urinary Tract: Bilateral adrenal glands appear normal. No hydronephrosis. Kidneys demonstrate symmetric enhancement. Stable 7 mm indeterminate posterior left lower pole renal lesion on image 77/2. Similar symmetric wall thickening of a nondistended  urinary bladder. Stomach/Bowel: Stomach is within normal limits. Appendix appears normal. No evidence of bowel wall thickening, distention, or inflammatory changes. Vascular/Lymphatic: Normal caliber abdominal aorta. Smooth IVC contours. The portal, splenic and superior mesenteric veins are patent. No pathologically enlarged abdominal or pelvic lymph nodes. Reproductive: Heterogeneous enhancement of an enlarged prostate gland. Other: No significant abdominopelvic free fluid. Musculoskeletal: Similar appearance of the mixed lytic and sclerotic metastasis at L4. IMPRESSION: 1. Similar size of the left hilar lymph nodes with central obstruction of the left upper lobe bronchus by the left hilar mass and progressive postobstructive bronchial plugging. 2. Numerous small ground-glass nodules seen throughout the left upper lobe are progressive from prior examination and some of which are now centrally cavitary, favored to reflect progressive postobstructive infection/inflammation but nonspecific and warranting attention on close term interval follow-up imaging. 3. Similar appearance of the mixed lytic and sclerotic osseous metastasis at T8 and L4. 4. No adenopathy or soft tissue metastasis in the abdomen or  pelvis. 5. Stable 7 mm indeterminate posterior left lower pole renal lesion. Suggest attention on follow-up imaging. 6. Heterogeneous enhancement of an enlarged prostate gland, suggest correlation with PSA. Electronically Signed   By: [facility] M.D.   On: 03/09/2023 11:43    ASSESSMENT AND PLAN: This is a very pleasant 68 years old African-American male with Stage IV (T2a, N2, M1b) non-small cell lung cancer, squamous cell carcinoma presented with left upper lobe lung mass in addition to AP window lymphadenopathy and suspicious bone metastasis to the T8 and L4 vertebrae as well as right retroperitoneal metastatic nodule diagnosed in April 2024.  Molecular studies by [id number] showed no actionable mutations and PD-L1 expression of 70%. The patient underwent palliative combination of systemic chemotherapy with carboplatin  for AUC of 5, paclitaxel  175 Mg/M2 and immunotherapy with Libtayo  (Cempilimab) 350 Mg IV every 3 weeks with Neulasta  support for 4 cycles followed by maintenance treatment with Libtayo  (Cempilimab) of the patient has no disease progression after cycle #4.  He is status post 4 cycles. He is currently undergoing maintenance treatment with single agent Libtayo  (Cempilimab) 350 Mg IV every 3 weeks status post 9 cycles.  He has been tolerating this treatment fairly well. The patient is doing fine today with no concerning complaints except for chronic low back pain. He had repeat CT scan of the chest, abdomen and pelvis performed recently.  I personally and independently reviewed the scan and discussed the result with the patient today.  His scan showed no concerning findings for disease progression.    Squamous Cell Carcinoma Diagnosed in April 2024. Completed four cycles of carboplatin  and paclitaxel , followed by nine cycles of cemiplimab  (Libtayo ) every three weeks. Recent imaging shows stable disease with mucus plugs in the left bronchial tree and incidental ground-glass opacities,  likely inflammatory. No significant weight loss; vital signs are stable. Current treatment appears effective. - Continue cemiplimab  (Libtayo ) every three weeks - Encourage Mucinex and warm liquids for mucus plugs - Schedule follow-up in three weeks  Respiratory Symptoms Increased breathing effort but no significant activity restriction. Productive cough with clear to yellowish, occasionally blood-tinged sputum. Oxygen saturation is 100%. Imaging shows mucus plugs in the left bronchial tree. - Encourage Mucinex and warm liquids for mucus plugs  Pain Management Current pain regimen (oxycodone  and morphine ) is less effective, with pain relief wearing off sooner. Takes 15 mg of morphine  every 12 hours, managed by palliative care team. Discussed adjusting morphine  dosage. - Discuss morphine  dosage adjustment with palliative care team  Follow-up - Schedule follow-up in three weeks.    For the back pain he underwent palliative radiotherapy to the bone disease at T8 and L4 vertebral lesions. For the bone metastasis, he is currently on treatment with Xgeva  120 mg subcutaneously every 6 weeks. The patient was advised to call immediately if he has any other concerning symptoms in the interval. The patient voices understanding of current disease status and treatment options and is in agreement with the current care plan.  All questions were answered. The patient knows to call the clinic with any problems, questions or concerns. We can certainly see the patient much sooner if necessary.  The total time spent in the appointment was 30 minutes.  Disclaimer: This note was dictated with voice recognition software. Similar sounding words can inadvertently be transcribed and may not be corrected upon review.

## 2023-03-16 NOTE — Patient Instructions (Signed)
 [facility] - A DEPT OF [facility]  Discharge Instructions: Thank you for choosing [facility] to provide your oncology and hematology care.   If you have a lab appointment with the Cancer Center, please go directly to the Cancer Center and check in at the registration area.   Wear comfortable clothing and clothing appropriate for easy access to any Portacath or PICC line.   We strive to give you quality time with your provider. You may need to reschedule your appointment if you arrive late (15 or more minutes).  Arriving late affects you and other patients whose appointments are after yours.  Also, if you miss three or more appointments without notifying the office, you may be dismissed from the clinic at the provider's discretion.      For prescription refill requests, have your pharmacy contact our office and allow 72 hours for refills to be completed.    Today you received the following chemotherapy and/or immunotherapy agents: Libtayo      To help prevent nausea and vomiting after your treatment, we encourage you to take your nausea medication as directed.  BELOW ARE SYMPTOMS THAT SHOULD BE REPORTED IMMEDIATELY: *FEVER GREATER THAN 100.4 F (38 C) OR HIGHER *CHILLS OR SWEATING *NAUSEA AND VOMITING THAT IS NOT CONTROLLED WITH YOUR NAUSEA MEDICATION *UNUSUAL SHORTNESS OF BREATH *UNUSUAL BRUISING OR BLEEDING *URINARY PROBLEMS (pain or burning when urinating, or frequent urination) *BOWEL PROBLEMS (unusual diarrhea, constipation, pain near the anus) TENDERNESS IN MOUTH AND THROAT WITH OR WITHOUT PRESENCE OF ULCERS (sore throat, sores in mouth, or a toothache) UNUSUAL RASH, SWELLING OR PAIN  UNUSUAL VAGINAL DISCHARGE OR ITCHING   Items with * indicate a potential emergency and should be followed up as soon as possible or go to the Emergency Department if any problems should occur.  Please show the CHEMOTHERAPY ALERT CARD or IMMUNOTHERAPY  ALERT CARD at check-in to the Emergency Department and triage nurse.  Should you have questions after your visit or need to cancel or reschedule your appointment, please contact [facility] - A DEPT OF [facility]  Dept: [phone]  and follow the prompts.  Office hours are 8:00 a.m. to 4:30 p.m. Monday - Friday. Please note that voicemails left after 4:00 p.m. may not be returned until the following business day.  We are closed weekends and major holidays. You have access to a nurse at all times for urgent questions. Please call the main number to the clinic Dept: [phone] and follow the prompts.   For any non-urgent questions, you may also contact your provider using MyChart. We now offer e-Visits for anyone 21 and older to request care online for non-urgent symptoms. For details visit [email or web address].   Also download the MyChart app! Go to the app store, search "MyChart", open the app, select [place], and log in with your MyChart username and password.  Denosumab Injection (Oncology) What is this medication? DENOSUMAB (den oh SUE mab) prevents weakened bones caused by cancer. It may also be used to treat noncancerous bone tumors that cannot be removed by surgery. It can also be used to treat high calcium levels in the blood caused by cancer. It works by blocking a protein that causes bones to break down quickly. This slows down the release of calcium from bones, which lowers calcium levels in your blood. It also makes your bones stronger and less likely to break (fracture). This  medicine may be used for other purposes; ask your health care provider or pharmacist if you have questions. COMMON BRAND NAME(S): XGEVA What should I tell my care team before I take this medication? They need to know if you have any of these conditions: Dental disease Having surgery or tooth extraction Infection Kidney disease Low levels of calcium or vitamin D in the  blood Malnutrition On hemodialysis Skin conditions or sensitivity Thyroid or parathyroid disease An unusual reaction to denosumab, other medications, foods, dyes, or preservatives Pregnant or trying to get pregnant Breast-feeding How should I use this medication? This medication is for injection under the skin. It is given by your care team in a hospital or clinic setting. A special MedGuide will be given to you before each treatment. Be sure to read this information carefully each time. Talk to your care team about the use of this medication in children. While it may be prescribed for children as young as 13 years for selected conditions, precautions do apply. Overdosage: If you think you have taken too much of this medicine contact a poison control center or emergency room at once. NOTE: This medicine is only for you. Do not share this medicine with others. What if I miss a dose? Keep appointments for follow-up doses. It is important not to miss your dose. Call your care team if you are unable to keep an appointment. What may interact with this medication? Do not take this medication with any of the following: Other medications containing denosumab This medication may also interact with the following: Medications that lower your chance of fighting infection Steroid medications, such as prednisone or cortisone This list may not describe all possible interactions. Give your health care provider a list of all the medicines, herbs, non-prescription drugs, or dietary supplements you use. Also tell them if you smoke, drink alcohol, or use illegal drugs. Some items may interact with your medicine. What should I watch for while using this medication? Your condition will be monitored carefully while you are receiving this medication. You may need blood work while taking this medication. This medication may increase your risk of getting an infection. Call your care team for advice if you get a  fever, chills, sore throat, or other symptoms of a cold or flu. Do not treat yourself. Try to avoid being around people who are sick. You should make sure you get enough calcium and vitamin D while you are taking this medication, unless your care team tells you not to. Discuss the foods you eat and the vitamins you take with your care team. Some people who take this medication have severe bone, joint, or muscle pain. This medication may also increase your risk for jaw problems or a broken thigh bone. Tell your care team right away if you have severe pain in your jaw, bones, joints, or muscles. Tell your care team if you have any pain that does not go away or that gets worse. Talk to your care team if you may be pregnant. Serious birth defects can occur if you take this medication during pregnancy and for 5 months after the last dose. You will need a negative pregnancy test before starting this medication. Contraception is recommended while taking this medication and for 5 months after the last dose. Your care team can help you find the option that works for you. What side effects may I notice from receiving this medication? Side effects that you should report to your care team as soon as  possible: Allergic reactions--skin rash, itching, hives, swelling of the face, lips, tongue, or throat Bone, joint, or muscle pain Low calcium level--muscle pain or cramps, confusion, tingling, or numbness in the hands or feet Osteonecrosis of the jaw--pain, swelling, or redness in the mouth, numbness of the jaw, poor healing after dental work, unusual discharge from the mouth, visible bones in the mouth Side effects that usually do not require medical attention (report to your care team if they continue or are bothersome): Cough Diarrhea Fatigue Headache Nausea This list may not describe all possible side effects. Call your doctor for medical advice about side effects. You may report side effects to FDA at  1-800-FDA-1088. Where should I keep my medication? This medication is given in a hospital or clinic. It will not be stored at home. NOTE: This sheet is a summary. It may not cover all possible information. If you have questions about this medicine, talk to your doctor, pharmacist, or health care provider.  2024 Elsevier/Gold Standard (2021-06-18 00:00:00)

## 2023-03-17 LAB — T4: T4, Total: 8.9 ug/dL (ref 4.5–12.0)

## 2023-03-26 ENCOUNTER — Other Ambulatory Visit: Payer: Self-pay | Admitting: Nurse Practitioner

## 2023-03-26 DIAGNOSIS — C3492 Malignant neoplasm of unspecified part of left bronchus or lung: Secondary | ICD-10-CM

## 2023-03-26 DIAGNOSIS — Z515 Encounter for palliative care: Secondary | ICD-10-CM

## 2023-03-26 DIAGNOSIS — G893 Neoplasm related pain (acute) (chronic): Secondary | ICD-10-CM

## 2023-03-26 MED ORDER — OXYCODONE HCL 5 MG PO TABS: 5.0000 mg | ORAL_TABLET | ORAL | 0 refills | Status: DC | PRN

## 2023-03-26 MED ORDER — MORPHINE SULFATE ER 15 MG PO TBCR
15.0000 mg | EXTENDED_RELEASE_TABLET | Freq: Two times a day (BID) | ORAL | 0 refills | Status: DC
Start: 2023-04-01 — End: 2023-04-09

## 2023-03-29 ENCOUNTER — Other Ambulatory Visit: Payer: Self-pay | Admitting: Family Medicine

## 2023-03-29 DIAGNOSIS — E785 Hyperlipidemia, unspecified: Secondary | ICD-10-CM

## 2023-04-05 NOTE — Progress Notes (Unsigned)
 Palliative Medicine [facility]  Telephone:([phone]) [phone] [phone]   Name: Albert Hardy. Date: 04/05/2023 MRN: [id number]  DOB: 20-Jan-1956  Patient Care Team: [name], MD as PCP - General (Family Medicine)    INTERVAL HISTORY: Albert Hardy. is a 68 y.o. 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 Palliative ask to see for symptom management and goals of care   SOCIAL HISTORY:     reports that he quit smoking about 11 years ago. His smoking use included cigarettes. He started smoking about 41 years ago. He has a 30 pack-year smoking history. He has never used smokeless tobacco. He reports current alcohol use. He reports current drug use. Frequency: 1.00 time per week. Drug: Marijuana.  ADVANCE DIRECTIVES:  None on file  CODE STATUS: Full code  PAST MEDICAL HISTORY: Past Medical History:  Diagnosis Date  . Aortic regurgitation   . Aortic stenosis 04/04/2018  . Atherosclerosis of native arteries of the extremities with intermittent claudication 09/08/2013  . Bilateral impacted cerumen 10/13/2018  . Bilateral sensorineural hearing loss 12/14/2018  . Cancer (HCC)    lung  . Dyspnea   . Former moderate cigarette smoker (10-19 per day) 05/20/2012   Quit smoking Nov 2013 when hospitalized w/ HTN and chest pain(diagnosed w/ valvular heart disease)  . GERD (gastroesophageal reflux disease)   . Heart murmur   . History of radiation therapy    Lumbar Spine, Thoracic Spine- 06/25/22-07/10/22- Dr. Antony Blackbird  . Hyperlipidemia   . Hypertension   . 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 Peripheral vascular disease with claudication    ABI .49    . 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 Substance abuse (HCC)     ALLERGIES:  is allergic to lipitor [atorvastatin] and pravastatin.  MEDICATIONS:  Current Outpatient Medications  Medication Sig Dispense Refill  .  albuterol (VENTOLIN HFA) 108 (90 Base) MCG/ACT inhaler Inhale 2 puffs into the lungs every 6 (six) hours as needed for wheezing or shortness of breath. 8 g 6  . apixaban (ELIQUIS) 5 MG TABS tablet Take 1 tablet (5 mg total) by mouth 2 (two) times daily. 60 tablet 3  . Ascorbic Acid (VITAMIN C PO) Take 1 tablet by mouth daily.    . Cholecalciferol (VITAMIN D3) 1000 UNITS CAPS Take 1,000 Units by mouth daily.    . Coenzyme Q10 (CO Q 10 PO) Take 1 tablet by mouth daily.    . Cyanocobalamin (VITAMIN B-12 PO) Take 1 tablet by mouth daily.    . cyclobenzaprine (FLEXERIL) 5 MG tablet Take 1 tablet (5 mg total) by mouth 3 (three) times daily as needed for muscle spasms. 30 tablet 0  . ezetimibe (ZETIA) 10 MG tablet Take 1 tablet (10 mg total) by mouth daily. 90 tablet 3  . fluticasone (FLONASE) 50 MCG/ACT nasal spray Place 1-2 sprays into both nostrils daily. 16 g 6  . KRILL OIL PO Take 1 tablet by mouth daily.    Marland Kitchen levofloxacin (LEVAQUIN) 500 MG tablet Take 1 tablet (500 mg total) by mouth daily. 7 tablet 0  . lisinopril-hydrochlorothiazide (ZESTORETIC) 10-12.5 MG tablet Take 1 tablet by mouth daily. 90 tablet 1  . MAGNESIUM PO Take 1 tablet by mouth daily.    . metoCLOPramide (REGLAN) 10 MG tablet Take 1 tablet (10 mg total) by mouth 4 (four) times daily -  before meals and at bedtime. 56 tablet 0  .  metoprolol tartrate (LOPRESSOR) 25 MG tablet Take 1 tablet (25 mg total) by mouth once for 1 dose. Please take this medication 2 hours before CT 1 tablet 0  . morphine (MS CONTIN) 15 MG 12 hr tablet Take 1 tablet (15 mg total) by mouth every 12 (twelve) hours. 60 tablet 0  . Multiple Vitamins-Minerals (CENTRUM SILVER PO) Take 1 tablet by mouth daily.    . Multiple Vitamins-Minerals (ZINC PO) Take 1 tablet by mouth daily.    . Naphazoline-Pheniramine (OPCON-A) 0.027-0.315 % SOLN Place 1 drop into both eyes daily as needed (redness).    . nitroGLYCERIN (NITROSTAT) 0.4 MG SL tablet Place 1 tablet (0.4 mg  total) under the tongue every 5 (five) minutes as needed for chest pain. 25 tablet 1  . omeprazole (PRILOSEC) 20 MG capsule Take 1 capsule (20 mg total) by mouth daily. 90 capsule 1  . oxyCODONE (OXY IR/ROXICODONE) 5 MG immediate release tablet Take 1 tablet (5 mg total) by mouth every 4 (four) hours as needed for severe pain (pain score 7-10). 90 tablet 0  . potassium chloride SA (KLOR-CON M) 20 MEQ tablet Take 1 tablet (20 mEq total) by mouth daily. 6 tablet 0  . prochlorperazine (COMPAZINE) 10 MG tablet Take 1 tablet (10 mg total) by mouth every 6 (six) hours as needed for nausea or vomiting. 30 tablet 0  . rosuvastatin (CRESTOR) 5 MG tablet TAKE 1 TABLET BY MOUTH AT BEDTIME 30 tablet 0  . TURMERIC PO Take 1 tablet by mouth 3 (three) times a week.    . umeclidinium-vilanterol (ANORO ELLIPTA) 62.5-25 MCG/ACT AEPB Inhale 1 puff into the lungs daily. 180 each 3   No current facility-administered medications for this visit.    VITAL SIGNS: There were no vitals taken for this visit. There were no vitals filed for this visit.  Estimated body mass index is 19.54 kg/m as calculated from the following:   Height as of 03/16/23: 5\' 9"  (1.753 m).   Weight as of 03/16/23: 132 lb 4.8 oz (60 kg).     Latest Ref Rng & Units 03/16/2023    8:31 AM 03/12/2023   11:30 PM 02/23/2023    9:02 AM  CBC  WBC 4.0 - 10.5 K/uL 3.7  3.8  4.4   Hemoglobin 13.0 - 17.0 g/dL 16.1  09.6  04.5   Hematocrit 39.0 - 52.0 % 35.2  35.7  38.9   Platelets 150 - 400 K/uL 167  179  182        Latest Ref Rng & Units 03/16/2023    8:31 AM 03/12/2023   11:30 PM 02/23/2023    9:02 AM  CMP  Glucose 70 - 99 mg/dL 409  811  914   BUN 8 - 23 mg/dL 20  27  24    Creatinine 0.61 - 1.24 mg/dL 7.82  9.56  2.13   Sodium 135 - 145 mmol/L 135  133  138   Potassium 3.5 - 5.1 mmol/L 4.0  4.1  3.9   Chloride 98 - 111 mmol/L 100  100  101   CO2 22 - 32 mmol/L 26  23  26    Calcium 8.9 - 10.3 mg/dL 9.3  9.5  08.6   Total Protein 6.5 - 8.1 g/dL  7.6   8.5   Total Bilirubin 0.0 - 1.2 mg/dL 0.5   0.3   Alkaline Phos 38 - 126 U/L 52   64   AST 15 - 41 U/L 21   30  ALT 0 - 44 U/L 24   40     PERFORMANCE STATUS (ECOG) : 1 - Symptomatic but completely ambulatory   Physical Exam General: NAD Cardiovascular: RRR Pulmonary: normal breathing pattern  Extremities: no edema, no joint deformities Skin: no rashes Neurological: AAO x4  IMPRESSION:  Albert Hardy present to clinic for symptom management follow-up. No acute distress. Denies nausea, vomiting, or diarrhea. However, he has experienced constipation, which he attributes to a lapse in taking his Senna. He plans to resume this medication. Continues to take things one day at a time. Occasional fatigue however tries to remain as active as possible.   The patient's appetite is reported as decreased, with meals typically consisting of two per day rather than three. Despite efforts to increase caloric intake, including the use of protein supplements, the patient has experienced a weight loss of approximately seven pounds since the beginning of December. He expresses a desire to regain weight, with a goal of reaching 150 pounds. He has been trialing a protein supplement from [employer] but reports dissatisfaction with the taste. He has been advised to try consuming two servings per day to aid in weight gain. He expresses a willingness to try different flavors of the supplement in an effort to improve his weight.   Albert Hardy notes that his pain is well-controlled as long as he adheres to his medication regimen. He currently taking MS Contin every 12 hours. Does not require frequent use of breakthrough medication. No changes to regimen at this time.   All questions answered and support provided.  Goals of Care   06/18/22-  We discussed his current illness and what it means in the larger context of hsi on-going co-morbidities. Natural disease trajectory and expectations were discussed.  Albert Hardy and  his wife are realistic in their expectations and understanding of his incurable cancer. He knows all treatment is palliative focused. Wishes to continue taking things one day at a time focusing on his quality of life allowing him every opportunity to continue to thrive.   We discussed Her current illness and what it means in the larger context of Her on-going co-morbidities. Natural disease trajectory and expectations were discussed.  I discussed the importance of continued conversation with family and their medical providers regarding overall plan of care and treatment options, ensuring decisions are within the context of the patients values and GOCs.  Assessment and Plan  Unintentional Weight Loss Decreased appetite and weight loss of 7 pounds over the past month. Patient is trying to increase caloric intake and is using Premier Protein shakes. -Encouraged to continue with protein shakes and aim for at least two meals a day. -Consider increasing to two protein shakes a day.  Constipation Constipation reported, possibly related to medication. -Advised to resume [place]. -Continue monitoring bowel movements and report any changes.  Pain Management Pain is well-controlled with current medication regimen. -Continue current pain medication as prescribed. MS Contin 15mg  every 12 hours. Oxycodone 5mg  every 6 hours as needed for breakthrough. Does not require frequently with last refill in November.   General Health Maintenance -Refilled current medications. -Encouraged to maintain regular eating habits and monitor weight. -I will plan to see patient back in 4-6 weeks.  Patient expressed understanding and was in agreement with this plan. He also understands that He can call the clinic at any time with any questions, concerns, or complaints.   Any controlled substances utilized were prescribed in the context of palliative care. PDMP has been reviewed.  Visit consisted of counseling and  education dealing with the complex and emotionally intense issues of symptom management and palliative care in the setting of serious and potentially life-threatening illness.  Willette Alma, AGPCNP-BC  Palliative Medicine Team/[facility]

## 2023-04-06 ENCOUNTER — Inpatient Hospital Stay (HOSPITAL_BASED_OUTPATIENT_CLINIC_OR_DEPARTMENT_OTHER): Payer: Medicare Other | Admitting: Nurse Practitioner

## 2023-04-06 ENCOUNTER — Inpatient Hospital Stay: Payer: Medicare Other

## 2023-04-06 ENCOUNTER — Inpatient Hospital Stay (HOSPITAL_BASED_OUTPATIENT_CLINIC_OR_DEPARTMENT_OTHER): Payer: Medicare Other | Admitting: Internal Medicine

## 2023-04-06 ENCOUNTER — Encounter: Payer: Self-pay | Admitting: Nurse Practitioner

## 2023-04-06 VITALS — BP 101/68 | HR 80 | Temp 97.9°F | Resp 17 | Ht 69.0 in | Wt 133.9 lb

## 2023-04-06 DIAGNOSIS — K5903 Drug induced constipation: Secondary | ICD-10-CM

## 2023-04-06 DIAGNOSIS — Z515 Encounter for palliative care: Secondary | ICD-10-CM

## 2023-04-06 DIAGNOSIS — L309 Dermatitis, unspecified: Secondary | ICD-10-CM

## 2023-04-06 DIAGNOSIS — C3492 Malignant neoplasm of unspecified part of left bronchus or lung: Secondary | ICD-10-CM

## 2023-04-06 DIAGNOSIS — G893 Neoplasm related pain (acute) (chronic): Secondary | ICD-10-CM | POA: Diagnosis not present

## 2023-04-06 DIAGNOSIS — Z5112 Encounter for antineoplastic immunotherapy: Secondary | ICD-10-CM | POA: Diagnosis not present

## 2023-04-06 LAB — CBC WITH DIFFERENTIAL (CANCER CENTER ONLY)
Abs Immature Granulocytes: 0.01 10*3/uL (ref 0.00–0.07)
Basophils Absolute: 0 10*3/uL (ref 0.0–0.1)
Basophils Relative: 0 %
Eosinophils Absolute: 0 10*3/uL (ref 0.0–0.5)
Eosinophils Relative: 1 %
HCT: 34.7 % — ABNORMAL LOW (ref 39.0–52.0)
Hemoglobin: 11.6 g/dL — ABNORMAL LOW (ref 13.0–17.0)
Immature Granulocytes: 0 %
Lymphocytes Relative: 25 %
Lymphs Abs: 0.9 10*3/uL (ref 0.7–4.0)
MCH: 29.1 pg (ref 26.0–34.0)
MCHC: 33.4 g/dL (ref 30.0–36.0)
MCV: 87 fL (ref 80.0–100.0)
Monocytes Absolute: 0.3 10*3/uL (ref 0.1–1.0)
Monocytes Relative: 9 %
Neutro Abs: 2.2 10*3/uL (ref 1.7–7.7)
Neutrophils Relative %: 65 %
Platelet Count: 189 10*3/uL (ref 150–400)
RBC: 3.99 MIL/uL — ABNORMAL LOW (ref 4.22–5.81)
RDW: 14 % (ref 11.5–15.5)
WBC Count: 3.4 10*3/uL — ABNORMAL LOW (ref 4.0–10.5)
nRBC: 0 % (ref 0.0–0.2)

## 2023-04-06 LAB — CMP (CANCER CENTER ONLY)
ALT: 23 U/L (ref 0–44)
AST: 20 U/L (ref 15–41)
Albumin: 4.4 g/dL (ref 3.5–5.0)
Alkaline Phosphatase: 55 U/L (ref 38–126)
Anion gap: 9 (ref 5–15)
BUN: 18 mg/dL (ref 8–23)
CO2: 27 mmol/L (ref 22–32)
Calcium: 9.1 mg/dL (ref 8.9–10.3)
Chloride: 102 mmol/L (ref 98–111)
Creatinine: 1.06 mg/dL (ref 0.61–1.24)
GFR, Estimated: >60 mL/min (ref >60)
Glucose, Bld: 102 mg/dL — ABNORMAL HIGH (ref 70–99)
Potassium: 3.6 mmol/L (ref 3.5–5.1)
Sodium: 138 mmol/L (ref 135–145)
Total Bilirubin: 0.4 mg/dL (ref 0.0–1.2)
Total Protein: 7.8 g/dL (ref 6.5–8.1)

## 2023-04-06 LAB — TSH: TSH: 0.528 u[IU]/mL (ref 0.350–4.500)

## 2023-04-06 MED ORDER — SODIUM CHLORIDE 0.9 % IV SOLN
350.0000 mg | Freq: Once | INTRAVENOUS | Status: AC
  Administered 2023-04-06: 350 mg via INTRAVENOUS
  Filled 2023-04-06: qty 7

## 2023-04-06 MED ORDER — TRIAMCINOLONE ACETONIDE 0.1 % EX CREA: TOPICAL_CREAM | Freq: Three times a day (TID) | CUTANEOUS | 2 refills | Status: AC | PRN

## 2023-04-06 MED ORDER — SODIUM CHLORIDE 0.9 % IV SOLN: Freq: Once | INTRAVENOUS | Status: AC

## 2023-04-06 NOTE — Progress Notes (Signed)
 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   Molecular studies by [id number] showed no actionable mutations and PD-L1 expression of 70%.  PRIOR THERAPY:  1) Palliative radiotherapy to the T8 and L4 metastatic bone disease. 2) Systemic chemotherapy with carboplatin for AUC of 5, paclitaxel 175 Mg/M2 and Libtayo (Cempilimab) 350 Mg IV every 3 weeks with Neulasta support for 4 cycles    CURRENT THERAPY: Maintenance treatment with single agent Libtayo (Cempilimab) 350 Mg IV every 3 weeks.  First dose September 08, 2022.  Status post 10 cycles.  INTERVAL HISTORY: Albert Hardy. 68 y.o. male returns to the clinic today for follow-up visit.  Discussed the use of AI scribe software for clinical note transcription with the patient, who gave verbal consent to proceed.  History of Present Illness   Albert Hardy. is a 68 year old male with stage four non-small cell lung cancer who presents for follow-up.  He was diagnosed with stage four non-small cell lung cancer, specifically squamous cell carcinoma, in April 2024, with metastatic disease to the T8 and L4 vertebrae. He has received palliative radiation to these areas and completed four cycles of chemotherapy with carboplatin for AUC of 5, paclitaxel 175 Mg/M2 and Libtayo (Cempilimab) 350 Mg IV every 3 weeks with Neulasta support.  He experiences soreness in his rib cage, which he attributes to coughing. The cough is productive without hemoptysis. No significant shortness of breath, but he needs to be conscious of his  breathing during exertion.  He experiences occasional nausea, particularly triggered by certain smells, but denies vomiting or diarrhea. His weight has remained stable, with a slight increase of two pounds since the last visit.  His blood pressure has been noted to be on the lower side, and he consumed Ensure prior to the visit. Lab work shows mild anemia and low blood count, but other parameters are stable.       MEDICAL HISTORY: Past Medical History:  Diagnosis Date   Aortic regurgitation    Aortic stenosis 04/04/2018   Atherosclerosis of native arteries of the extremities with intermittent claudication 09/08/2013   Bilateral impacted cerumen 10/13/2018   Bilateral sensorineural hearing loss 12/14/2018   Cancer (HCC)    lung   Dyspnea    Former moderate cigarette smoker (10-19 per day) 05/20/2012   Quit smoking Nov 2013 when hospitalized w/ HTN and chest pain(diagnosed w/ valvular heart disease)   GERD (gastroesophageal reflux disease)    Heart murmur    History of radiation therapy    Lumbar Spine, Thoracic Spine- 06/25/22-07/10/22- Dr. 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  MEDICATIONS:  Current Outpatient Medications  Medication Sig Dispense Refill   albuterol (VENTOLIN HFA) 108 (90 Base) MCG/ACT inhaler Inhale 2 puffs into the lungs every 6 (six) hours  as needed for wheezing or shortness of breath. 8 g 6   apixaban (ELIQUIS) 5 MG TABS tablet Take 1 tablet (5 mg total) by mouth 2 (two) times daily. 60 tablet 3   Ascorbic Acid (VITAMIN C PO) Take 1 tablet by mouth daily.     Cholecalciferol (VITAMIN D3) 1000 UNITS CAPS Take 1,000 Units by mouth daily.     Coenzyme Q10 (CO Q 10 PO) Take 1 tablet by mouth daily.      Cyanocobalamin (VITAMIN B-12 PO) Take 1 tablet by mouth daily.     cyclobenzaprine (FLEXERIL) 5 MG tablet Take 1 tablet (5 mg total) by mouth 3 (three) times daily as needed for muscle spasms. 30 tablet 0   ezetimibe (ZETIA) 10 MG tablet Take 1 tablet (10 mg total) by mouth daily. 90 tablet 3   fluticasone (FLONASE) 50 MCG/ACT nasal spray Place 1-2 sprays into both nostrils daily. 16 g 6   KRILL OIL PO Take 1 tablet by mouth daily.     levofloxacin (LEVAQUIN) 500 MG tablet Take 1 tablet (500 mg total) by mouth daily. 7 tablet 0   lisinopril-hydrochlorothiazide (ZESTORETIC) 10-12.5 MG tablet Take 1 tablet by mouth daily. 90 tablet 1   MAGNESIUM PO Take 1 tablet by mouth daily.     metoCLOPramide (REGLAN) 10 MG tablet Take 1 tablet (10 mg total) by mouth 4 (four) times daily -  before meals and at bedtime. 56 tablet 0   metoprolol tartrate (LOPRESSOR) 25 MG tablet Take 1 tablet (25 mg total) by mouth once for 1 dose. Please take this medication 2 hours before CT 1 tablet 0   morphine (MS CONTIN) 15 MG 12 hr tablet Take 1 tablet (15 mg total) by mouth every 12 (twelve) hours. 60 tablet 0   Multiple Vitamins-Minerals (CENTRUM SILVER PO) Take 1 tablet by mouth daily.     Multiple Vitamins-Minerals (ZINC PO) Take 1 tablet by mouth daily.     Naphazoline-Pheniramine (OPCON-A) 0.027-0.315 % SOLN Place 1 drop into both eyes daily as needed (redness).     nitroGLYCERIN (NITROSTAT) 0.4 MG SL tablet Place 1 tablet (0.4 mg total) under the tongue every 5 (five) minutes as needed for chest pain. 25 tablet 1   omeprazole (PRILOSEC) 20 MG capsule Take 1 capsule (20 mg total) by mouth daily. 90 capsule 1   oxyCODONE (OXY IR/ROXICODONE) 5 MG immediate release tablet Take 1 tablet (5 mg total) by mouth every 4 (four) hours as needed for severe pain (pain score 7-10). 90 tablet 0   potassium chloride SA (KLOR-CON M) 20 MEQ tablet Take 1 tablet (20 mEq total) by mouth daily. 6 tablet 0   prochlorperazine (COMPAZINE) 10  MG tablet Take 1 tablet (10 mg total) by mouth every 6 (six) hours as needed for nausea or vomiting. 30 tablet 0   rosuvastatin (CRESTOR) 5 MG tablet TAKE 1 TABLET BY MOUTH AT BEDTIME 30 tablet 0   TURMERIC PO Take 1 tablet by mouth 3 (three) times a week.     umeclidinium-vilanterol (ANORO ELLIPTA) 62.5-25 MCG/ACT AEPB Inhale 1 puff into the lungs daily. 180 each 3   No current facility-administered medications for this visit.    SURGICAL HISTORY:  Past Surgical History:  Procedure Laterality Date   BRONCHIAL BIOPSY  05/26/2022   Procedure: BRONCHIAL BIOPSIES;  Surgeon: Josephine Igo, DO;  Location: [facility];  Service: Pulmonary;;   BRONCHIAL BRUSHINGS  05/26/2022   Procedure: BRONCHIAL BRUSHINGS;  Surgeon: Josephine Igo, DO;  Location: [facility];  Service: Pulmonary;;   NO PAST SURGERIES     VIDEO BRONCHOSCOPY  05/26/2022   Procedure: VIDEO BRONCHOSCOPY WITHOUT FLUORO;  Surgeon: Josephine Igo, DO;  Location: [facility];  Service: Pulmonary;;    REVIEW OF SYSTEMS:  A comprehensive review of systems was negative except for: Respiratory: positive for cough   PHYSICAL EXAMINATION: General appearance: alert, cooperative, and no distress Head: Normocephalic, without obvious abnormality, atraumatic Neck: no adenopathy, no JVD, supple, symmetrical, trachea midline, and thyroid not enlarged, symmetric, no tenderness/mass/nodules Lymph nodes: Cervical, supraclavicular, and axillary nodes normal. Resp: clear to auscultation bilaterally Back: symmetric, no curvature. ROM normal. No CVA tenderness. Cardio: regular rate and rhythm, S1, S2 normal, no murmur, click, rub or gallop GI: soft, non-tender; bowel sounds normal; no masses,  no organomegaly Extremities: extremities normal, atraumatic, no cyanosis or edema  ECOG PERFORMANCE STATUS: 1 - Symptomatic but completely ambulatory  Blood pressure 101/68, pulse 80, temperature 97.9 F (36.6 C), temperature source Temporal, resp.  rate 17, height 5\' 9"  (1.753 m), weight 133 lb 14.4 oz (60.7 kg), SpO2 100%.  LABORATORY DATA: Lab Results  Component Value Date   WBC 3.4 (L) 04/06/2023   HGB 11.6 (L) 04/06/2023   HCT 34.7 (L) 04/06/2023   MCV 87.0 04/06/2023   PLT 189 04/06/2023      Chemistry      Component Value Date/Time   NA 138 04/06/2023 0938   NA 146 (H) 01/05/2023 1114   K 3.6 04/06/2023 0938   CL 102 04/06/2023 0938   CO2 27 04/06/2023 0938   BUN 18 04/06/2023 0938   BUN 11 01/05/2023 1114   CREATININE 1.06 04/06/2023 0938   CREATININE 1.07 08/27/2014 1511      Component Value Date/Time   CALCIUM 9.1 04/06/2023 0938   ALKPHOS 55 04/06/2023 0938   AST 20 04/06/2023 0938   ALT 23 04/06/2023 0938   BILITOT 0.4 04/06/2023 0938       RADIOGRAPHIC STUDIES: DG Chest 2 View Result Date: 03/12/2023 CLINICAL DATA:  Chest pain EXAM: CHEST - 2 VIEW COMPARISON:  08/25/2022 FINDINGS: The heart size and mediastinal contours are within normal limits. Both lungs are clear. The visualized skeletal structures are unremarkable. IMPRESSION: No active cardiopulmonary disease. Electronically Signed   By: Deatra Robinson M.D.   On: 03/12/2023 23:59   CT CHEST ABDOMEN PELVIS W CONTRAST Result Date: 03/09/2023 CLINICAL DATA:  Metastatic lung cancer, restaging. * Tracking Code: BO * EXAM: CT CHEST, ABDOMEN, AND PELVIS WITH CONTRAST TECHNIQUE: Multidetector CT imaging of the chest, abdomen and pelvis was performed following the standard protocol during bolus administration of intravenous contrast. RADIATION DOSE REDUCTION: This exam was performed according to the departmental dose-optimization program which includes automated exposure control, adjustment of the mA and/or kV according to patient size and/or use of iterative reconstruction technique. CONTRAST:  OMNIPAQUE IOHEXOL 300 MG/ML  SOLN COMPARISON:  Multiple priors including most recent CT chest abdomen and pelvis January 06, 2023. FINDINGS: CT CHEST FINDINGS  Cardiovascular: No significant vascular findings. Normal heart size. No pericardial effusion. Mediastinum/Nodes: Similar size of left hilar lymph nodes with the previously indexed lymph node measuring 22 x 18 mm on image 31/2 previously 23 x 18 mm. No suspicious thyroid nodule. The esophagus is grossly unremarkable. Lungs/Pleura: Left hilar mass causes central obstruction of the left upper lobe bronchus with progressive postobstructive bronchial plugging. Numerous small ground-glass nodules seen throughout the left upper lobe are progressive from prior examination and some of  which are now centrally cavitary. Musculoskeletal: Similar appearance of the mixed lytic and sclerotic osseous metastasis at T8. CT ABDOMEN PELVIS FINDINGS Hepatobiliary: No aggressive lytic or blastic lesion of bone. Gallbladder is unremarkable. No biliary ductal dilation. Pancreas: Pancreatic ductal dilation or evidence of acute inflammation. Spleen: No splenomegaly. Adrenals/Urinary Tract: Bilateral adrenal glands appear normal. No hydronephrosis. Kidneys demonstrate symmetric enhancement. Stable 7 mm indeterminate posterior left lower pole renal lesion on image 77/2. Similar symmetric wall thickening of a nondistended urinary bladder. Stomach/Bowel: Stomach is within normal limits. Appendix appears normal. No evidence of bowel wall thickening, distention, or inflammatory changes. Vascular/Lymphatic: Normal caliber abdominal aorta. Smooth IVC contours. The portal, splenic and superior mesenteric veins are patent. No pathologically enlarged abdominal or pelvic lymph nodes. Reproductive: Heterogeneous enhancement of an enlarged prostate gland. Other: No significant abdominopelvic free fluid. Musculoskeletal: Similar appearance of the mixed lytic and sclerotic metastasis at L4. IMPRESSION: 1. Similar size of the left hilar lymph nodes with central obstruction of the left upper lobe bronchus by the left hilar mass and progressive postobstructive  bronchial plugging. 2. Numerous small ground-glass nodules seen throughout the left upper lobe are progressive from prior examination and some of which are now centrally cavitary, favored to reflect progressive postobstructive infection/inflammation but nonspecific and warranting attention on close term interval follow-up imaging. 3. Similar appearance of the mixed lytic and sclerotic osseous metastasis at T8 and L4. 4. No adenopathy or soft tissue metastasis in the abdomen or pelvis. 5. Stable 7 mm indeterminate posterior left lower pole renal lesion. Suggest attention on follow-up imaging. 6. Heterogeneous enhancement of an enlarged prostate gland, suggest correlation with PSA. Electronically Signed   By: Maudry Mayhew M.D.   On: 03/09/2023 11:43    ASSESSMENT AND PLAN: This is a very pleasant 68 years old African-American male with Stage IV (T2a, N2, M1b) non-small cell lung cancer, squamous cell carcinoma presented with left upper lobe lung mass in addition to AP window lymphadenopathy and suspicious bone metastasis to the T8 and L4 vertebrae as well as right retroperitoneal metastatic nodule diagnosed in April 2024.  Molecular studies by [id number] showed no actionable mutations and PD-L1 expression of 70%. The patient underwent palliative combination of systemic chemotherapy with carboplatin for AUC of 5, paclitaxel 175 Mg/M2 and immunotherapy with Libtayo (Cempilimab) 350 Mg IV every 3 weeks with Neulasta support for 4 cycles followed by maintenance treatment with Libtayo (Cempilimab) of the patient has no disease progression after cycle #4.  He is status post 4 cycles. He is currently undergoing maintenance treatment with single agent Libtayo (Cempilimab) 350 Mg IV every 3 weeks status post 10 cycles.      Stage IV Non-Small Cell Lung Cancer (NSCLC) - Squamous Cell Carcinoma Diagnosed April 2024 with metastasis to T8 and L4 vertebrae. Completed palliative radiation and four cycles of chemotherapy  with carboplatin for AUC of 5, paclitaxel 175 Mg/M2 and Libtayo (Cempilimab) 350 Mg IV every 3 weeks with Neulasta support. He is currently on Maintenance treatment with single agent Libtayo (Cempilimab) 350 Mg IV every 3 weeks.  First dose September 08, 2022.  Status post 10 cycles. Reports rib cage soreness from coughing, now resolved. Occasional nausea triggered by certain smells, no vomiting, diarrhea, or hemoptysis. Productive cough without hemoptysis, mild exertional dyspnea. Weight stable with a 2-pound gain since last visit. Mild hypotension likely due to recent Ensure intake. Lab work shows mild anemia and low blood count; other parameters are within normal limits. - Continue current treatment regimen - Monitor  weight and blood pressure - Order CBC - Follow-up in three weeks  Mild Anemia Mild anemia noted on recent lab work, suitable for ongoing treatment. - Monitor hemoglobin and hematocrit - Reassess at next follow-up.    For the back pain he underwent palliative radiotherapy to the bone disease at T8 and L4 vertebral lesions. For the bone metastasis, he is currently on treatment with Xgeva 120 mg subcutaneously every 6 weeks. He was advised to call immediately if he has any other concerning symptoms in the interval. The patient voices understanding of current disease status and treatment options and is in agreement with the current care plan.  All questions were answered. The patient knows to call the clinic with any problems, questions or concerns. We can certainly see the patient much sooner if necessary.  The total time spent in the appointment was 20 minutes.  Disclaimer: This note was dictated with voice recognition software. Similar sounding words can inadvertently be transcribed and may not be corrected upon review.

## 2023-04-06 NOTE — Patient Instructions (Signed)
 [facility] - A DEPT OF [facility]  Discharge Instructions: Thank you for choosing [facility] to provide your oncology and hematology care.   If you have a lab appointment with the Cancer Center, please go directly to the Cancer Center and check in at the registration area.   Wear comfortable clothing and clothing appropriate for easy access to any Portacath or PICC line.   We strive to give you quality time with your provider. You may need to reschedule your appointment if you arrive late (15 or more minutes).  Arriving late affects you and other patients whose appointments are after yours.  Also, if you miss three or more appointments without notifying the office, you may be dismissed from the clinic at the provider's discretion.      For prescription refill requests, have your pharmacy contact our office and allow 72 hours for refills to be completed.    Today you received the following chemotherapy and/or immunotherapy agents: Libtayo      To help prevent nausea and vomiting after your treatment, we encourage you to take your nausea medication as directed.  BELOW ARE SYMPTOMS THAT SHOULD BE REPORTED IMMEDIATELY: *FEVER GREATER THAN 100.4 F (38 C) OR HIGHER *CHILLS OR SWEATING *NAUSEA AND VOMITING THAT IS NOT CONTROLLED WITH YOUR NAUSEA MEDICATION *UNUSUAL SHORTNESS OF BREATH *UNUSUAL BRUISING OR BLEEDING *URINARY PROBLEMS (pain or burning when urinating, or frequent urination) *BOWEL PROBLEMS (unusual diarrhea, constipation, pain near the anus) TENDERNESS IN MOUTH AND THROAT WITH OR WITHOUT PRESENCE OF ULCERS (sore throat, sores in mouth, or a toothache) UNUSUAL RASH, SWELLING OR PAIN  UNUSUAL VAGINAL DISCHARGE OR ITCHING   Items with * indicate a potential emergency and should be followed up as soon as possible or go to the Emergency Department if any problems should occur.  Please show the CHEMOTHERAPY ALERT CARD or IMMUNOTHERAPY  ALERT CARD at check-in to the Emergency Department and triage nurse.  Should you have questions after your visit or need to cancel or reschedule your appointment, please contact [facility] - A DEPT OF [facility]  Dept: [phone]  and follow the prompts.  Office hours are 8:00 a.m. to 4:30 p.m. Monday - Friday. Please note that voicemails left after 4:00 p.m. may not be returned until the following business day.  We are closed weekends and major holidays. You have access to a nurse at all times for urgent questions. Please call the main number to the clinic Dept: [phone] and follow the prompts.   For any non-urgent questions, you may also contact your provider using MyChart. We now offer e-Visits for anyone 21 and older to request care online for non-urgent symptoms. For details visit [email or web address].   Also download the MyChart app! Go to the app store, search "MyChart", open the app, select [place], and log in with your MyChart username and password.  Denosumab Injection (Oncology) What is this medication? DENOSUMAB (den oh SUE mab) prevents weakened bones caused by cancer. It may also be used to treat noncancerous bone tumors that cannot be removed by surgery. It can also be used to treat high calcium levels in the blood caused by cancer. It works by blocking a protein that causes bones to break down quickly. This slows down the release of calcium from bones, which lowers calcium levels in your blood. It also makes your bones stronger and less likely to break (fracture). This  medicine may be used for other purposes; ask your health care provider or pharmacist if you have questions. COMMON BRAND NAME(S): XGEVA What should I tell my care team before I take this medication? They need to know if you have any of these conditions: Dental disease Having surgery or tooth extraction Infection Kidney disease Low levels of calcium or vitamin D in the  blood Malnutrition On hemodialysis Skin conditions or sensitivity Thyroid or parathyroid disease An unusual reaction to denosumab, other medications, foods, dyes, or preservatives Pregnant or trying to get pregnant Breast-feeding How should I use this medication? This medication is for injection under the skin. It is given by your care team in a hospital or clinic setting. A special MedGuide will be given to you before each treatment. Be sure to read this information carefully each time. Talk to your care team about the use of this medication in children. While it may be prescribed for children as young as 13 years for selected conditions, precautions do apply. Overdosage: If you think you have taken too much of this medicine contact a poison control center or emergency room at once. NOTE: This medicine is only for you. Do not share this medicine with others. What if I miss a dose? Keep appointments for follow-up doses. It is important not to miss your dose. Call your care team if you are unable to keep an appointment. What may interact with this medication? Do not take this medication with any of the following: Other medications containing denosumab This medication may also interact with the following: Medications that lower your chance of fighting infection Steroid medications, such as prednisone or cortisone This list may not describe all possible interactions. Give your health care provider a list of all the medicines, herbs, non-prescription drugs, or dietary supplements you use. Also tell them if you smoke, drink alcohol, or use illegal drugs. Some items may interact with your medicine. What should I watch for while using this medication? Your condition will be monitored carefully while you are receiving this medication. You may need blood work while taking this medication. This medication may increase your risk of getting an infection. Call your care team for advice if you get a  fever, chills, sore throat, or other symptoms of a cold or flu. Do not treat yourself. Try to avoid being around people who are sick. You should make sure you get enough calcium and vitamin D while you are taking this medication, unless your care team tells you not to. Discuss the foods you eat and the vitamins you take with your care team. Some people who take this medication have severe bone, joint, or muscle pain. This medication may also increase your risk for jaw problems or a broken thigh bone. Tell your care team right away if you have severe pain in your jaw, bones, joints, or muscles. Tell your care team if you have any pain that does not go away or that gets worse. Talk to your care team if you may be pregnant. Serious birth defects can occur if you take this medication during pregnancy and for 5 months after the last dose. You will need a negative pregnancy test before starting this medication. Contraception is recommended while taking this medication and for 5 months after the last dose. Your care team can help you find the option that works for you. What side effects may I notice from receiving this medication? Side effects that you should report to your care team as soon as  possible: Allergic reactions--skin rash, itching, hives, swelling of the face, lips, tongue, or throat Bone, joint, or muscle pain Low calcium level--muscle pain or cramps, confusion, tingling, or numbness in the hands or feet Osteonecrosis of the jaw--pain, swelling, or redness in the mouth, numbness of the jaw, poor healing after dental work, unusual discharge from the mouth, visible bones in the mouth Side effects that usually do not require medical attention (report to your care team if they continue or are bothersome): Cough Diarrhea Fatigue Headache Nausea This list may not describe all possible side effects. Call your doctor for medical advice about side effects. You may report side effects to FDA at  1-800-FDA-1088. Where should I keep my medication? This medication is given in a hospital or clinic. It will not be stored at home. NOTE: This sheet is a summary. It may not cover all possible information. If you have questions about this medicine, talk to your doctor, pharmacist, or health care provider.  2024 Elsevier/Gold Standard (2021-06-18 00:00:00)

## 2023-04-07 LAB — T4: T4, Total: 8.6 ug/dL (ref 4.5–12.0)

## 2023-04-09 ENCOUNTER — Other Ambulatory Visit: Payer: Self-pay

## 2023-04-09 DIAGNOSIS — C3492 Malignant neoplasm of unspecified part of left bronchus or lung: Secondary | ICD-10-CM

## 2023-04-09 DIAGNOSIS — Z515 Encounter for palliative care: Secondary | ICD-10-CM

## 2023-04-09 DIAGNOSIS — G893 Neoplasm related pain (acute) (chronic): Secondary | ICD-10-CM

## 2023-04-09 MED ORDER — MORPHINE SULFATE ER 15 MG PO TBCR: 15.0000 mg | EXTENDED_RELEASE_TABLET | Freq: Two times a day (BID) | ORAL | 0 refills | Status: DC

## 2023-04-09 NOTE — Telephone Encounter (Signed)
 Medication is not in stock at primary pharmacy, resent to secondary pharmacy, pt aware.

## 2023-04-19 ENCOUNTER — Other Ambulatory Visit: Payer: Self-pay | Admitting: Family Medicine

## 2023-04-19 DIAGNOSIS — K219 Gastro-esophageal reflux disease without esophagitis: Secondary | ICD-10-CM

## 2023-04-20 NOTE — Progress Notes (Signed)
 [facility] OFFICE PROGRESS NOTE  [name], MD [address] [place] [address]  DIAGNOSIS: Stage IV (T2a, N2, M1 B) non-small cell lung cancer, squamous cell carcinoma presented with left upper lobe lung mass in addition to AP window lymphadenopathy and suspicious bone metastasis to the T8 and L4 vertebrae in addition to retroperitoneal metastatic soft tissue nodule diagnosed in April 2024.    Molecular studies by [id number] showed no actionable mutations and PD-L1 expression of 70%.  PRIOR THERAPY: 1) Palliative radiotherapy to the T8 and L4 metastatic bone disease. 2) Systemic chemotherapy with carboplatin for AUC of 5, paclitaxel 175 Mg/M2 and Libtayo (Cempilimab) 350 Mg IV every 3 weeks with Neulasta support for 4 cycles   CURRENT THERAPY: Maintenance treatment with single agent Libtayo (Cempilimab) 350 Mg IV every 3 weeks.  First dose September 08, 2022.  Status post 11 cycles.  2) Rivka Barbara every 6 weeks  INTERVAL HISTORY: Albert Hardy. 68 y.o. male returns to the clinic today for a follow-up visit. He completed chemotherapy/immunotherpay as well as pallitive radiation to the bones. He is currently on maintenance immunotherapy and tolerating this well.   Today he denies any fever.  Patient states that he is cold natured and does get chilled from time to time but he has not noticed any change in the pattern or frequency or severity.  He reports overall he is eating better and has gained 3 pounds since he was last seen.  He states that intermittently his reading may become more labored but then improves.  He has inhalers which do help a lot with his breathing.  He may have a mild cough intermittently that may be a little bit more frequent than prior but overall he states he does not cough a lot and it does not warrant needing to take anything for cough.  He denies any chest pain or hemoptysis.  Did have some nausea recently but it was effectively controlled with  Compazine.  Denies any vomiting.  He sometimes has constipation and he takes Senokot 2 times per day.  He is following closely with palliative care and he is scheduled to see them today.  He is on Flexeril and oxycodone.  His pain is controlled as long as he takes this on a regular basis.  He denies any headaches.  He reports sometimes his vision is blurry but he is overdue for an eye exam and he is going to contact them to schedule an eye exam.  He drinks 1 protein supplemental drink per day.  He is here today for evaluation repeat blood work before undergoing this next cycle of treatment with cycle #12     MEDICAL HISTORY: Past Medical History:  Diagnosis Date   Aortic regurgitation    Aortic stenosis 04/04/2018   Atherosclerosis of native arteries of the extremities with intermittent claudication 09/08/2013   Bilateral impacted cerumen 10/13/2018   Bilateral sensorineural hearing loss 12/14/2018   Cancer (HCC)    lung   Dyspnea    Former moderate cigarette smoker (10-19 per day) 05/20/2012   Quit smoking Nov 2013 when hospitalized w/ HTN and chest pain(diagnosed w/ valvular heart disease)   GERD (gastroesophageal reflux disease)    Heart murmur    History of radiation therapy    Lumbar Spine, Thoracic Spine- 06/25/22-07/10/22- Dr. 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  MEDICATIONS:  Current Outpatient Medications  Medication Sig Dispense Refill   albuterol (VENTOLIN HFA) 108 (90 Base) MCG/ACT inhaler Inhale 2 puffs into the lungs every 6 (six) hours as needed for wheezing or shortness of breath. 8 g 6   apixaban (ELIQUIS) 5 MG TABS tablet Take 1 tablet (5 mg total) by mouth 2 (two) times daily. 60 tablet 3   Ascorbic Acid (VITAMIN  C PO) Take 1 tablet by mouth daily.     Cholecalciferol (VITAMIN D3) 1000 UNITS CAPS Take 1,000 Units by mouth daily.     Coenzyme Q10 (CO Q 10 PO) Take 1 tablet by mouth daily.     Cyanocobalamin (VITAMIN B-12 PO) Take 1 tablet by mouth daily.     cyclobenzaprine (FLEXERIL) 5 MG tablet Take 1 tablet (5 mg total) by mouth 3 (three) times daily as needed for muscle spasms. 30 tablet 0   ezetimibe (ZETIA) 10 MG tablet Take 1 tablet (10 mg total) by mouth daily. 90 tablet 3   fluticasone (FLONASE) 50 MCG/ACT nasal spray Place 1-2 sprays into both nostrils daily. 16 g 6   KRILL OIL PO Take 1 tablet by mouth daily.     levofloxacin (LEVAQUIN) 500 MG tablet Take 1 tablet (500 mg total) by mouth daily. 7 tablet 0   lisinopril-hydrochlorothiazide (ZESTORETIC) 10-12.5 MG tablet Take 1 tablet by mouth daily. 90 tablet 1   MAGNESIUM PO Take 1 tablet by mouth daily.     metoCLOPramide (REGLAN) 10 MG tablet Take 1 tablet (10 mg total) by mouth 4 (four) times daily -  before meals and at bedtime. 56 tablet 0   morphine (MS CONTIN) 15 MG 12 hr tablet Take 1 tablet (15 mg total) by mouth every 12 (twelve) hours. 60 tablet 0   Multiple Vitamins-Minerals (CENTRUM SILVER PO) Take 1 tablet by mouth daily.     Multiple Vitamins-Minerals (ZINC PO) Take 1 tablet by mouth daily.     Naphazoline-Pheniramine (OPCON-A) 0.027-0.315 % SOLN Place 1 drop into both eyes daily as needed (redness).     nitroGLYCERIN (NITROSTAT) 0.4 MG SL tablet Place 1 tablet (0.4 mg total) under the tongue every 5 (five) minutes as needed for chest pain. 25 tablet 1   omeprazole (PRILOSEC) 20 MG capsule Take 1 capsule by mouth once daily 30 capsule 0   oxyCODONE (OXY IR/ROXICODONE) 5 MG immediate release tablet Take 1 tablet (5 mg total) by mouth every 4 (four) hours as needed for severe pain (pain score 7-10). 90 tablet 0   potassium chloride SA (KLOR-CON M) 20 MEQ tablet Take 1 tablet (20 mEq total) by mouth daily. 6 tablet 0    prochlorperazine (COMPAZINE) 10 MG tablet Take 1 tablet (10 mg total) by mouth every 6 (six) hours as needed for nausea or vomiting. 30 tablet 0   rosuvastatin (CRESTOR) 5 MG tablet TAKE 1 TABLET BY MOUTH AT BEDTIME 30 tablet 0   triamcinolone 0.1%-Eucerin equivalent 1:1 cream mixture Apply topically 3 (three) times daily as needed. 480 g 2   TURMERIC PO Take 1 tablet by mouth 3 (three) times a week.     umeclidinium-vilanterol (ANORO ELLIPTA) 62.5-25 MCG/ACT AEPB Inhale 1 puff into the lungs daily. 180 each 3   metoprolol tartrate (LOPRESSOR) 25 MG tablet Take 1 tablet (25 mg total) by mouth once  for 1 dose. Please take this medication 2 hours before CT 1 tablet 0   No current facility-administered medications for this visit.    SURGICAL HISTORY:  Past Surgical History:  Procedure Laterality Date   BRONCHIAL BIOPSY  05/26/2022   Procedure: BRONCHIAL BIOPSIES;  Surgeon: Josephine Igo, DO;  Location: [facility];  Service: Pulmonary;;   BRONCHIAL BRUSHINGS  05/26/2022   Procedure: BRONCHIAL BRUSHINGS;  Surgeon: Josephine Igo, DO;  Location: [facility];  Service: Pulmonary;;   NO PAST SURGERIES     VIDEO BRONCHOSCOPY  05/26/2022   Procedure: VIDEO BRONCHOSCOPY WITHOUT FLUORO;  Surgeon: Josephine Igo, DO;  Location: [facility];  Service: Pulmonary;;    REVIEW OF SYSTEMS:   Review of Systems  Constitutional: Positive for stable chills. Improving appetite and mild weight gain. Negative for fever.  HENT: Negative for mouth sores, nosebleeds, sore throat and trouble swallowing.   Eyes: Positive for occasional blurry vision. Negative for icterus.  Respiratory: Positive for intermittent chronic cough which he state is unchanged. Slight increase in dyspnea on exertion. Negative for hemoptysis and wheezing.  Cardiovascular: Negative for chest pain and leg swelling.  Gastrointestinal: Mild nausea and occasional constipation. Negative for abdominal pain, diarrhea, and vomiting.   Genitourinary: Negative for bladder incontinence, difficulty urinating, dysuria, frequency and hematuria.   Musculoskeletal: Negative for back pain, gait problem, neck pain and neck stiffness.  Skin: Negative for itching and rash.  Neurological: Negative for dizziness, extremity weakness, gait problem, headaches, light-headedness and seizures.  Hematological: Negative for adenopathy. Does not bruise/bleed easily.  Psychiatric/Behavioral: Negative for confusion, depression and sleep disturbance. The patient is not nervous/anxious.     PHYSICAL EXAMINATION:  Blood pressure (!) 106/48, pulse 72, temperature (!) 97.5 F (36.4 C), temperature source Temporal, resp. rate 14, weight 136 lb 1.6 oz (61.7 kg), SpO2 100%.  ECOG PERFORMANCE STATUS: 1  Physical Exam  Constitutional: Oriented to person, place, and time and thin appearing male, and in no distress.  HENT:  Head: Normocephalic and atraumatic.  Mouth/Throat: Oropharynx is clear and moist. No oropharyngeal exudate.  Eyes: Conjunctivae are normal. Right eye exhibits no discharge. Left eye exhibits no discharge. No scleral icterus.  Neck: Normal range of motion. Neck supple.  Cardiovascular: Normal rate, regular rhythm, murmur noted (he sees cardiology) and intact distal pulses.   Pulmonary/Chest: Effort normal and breath sounds normal. No respiratory distress. No wheezes. No rales.  Abdominal: Soft. Bowel sounds are normal. Exhibits no distension and no mass. There is no tenderness.  Musculoskeletal: Normal range of motion. Exhibits no edema.  Lymphadenopathy:    No cervical adenopathy.  Neurological: Alert and oriented to person, place, and time. Exhibits normal muscle tone. Gait normal. Coordination normal.  Skin: Skin is warm and dry. No rash noted. Not diaphoretic. No erythema. No pallor.  Psychiatric: Mood, memory and judgment normal.  Vitals reviewed.  LABORATORY DATA: Lab Results  Component Value Date   WBC 4.3 04/27/2023    HGB 11.4 (L) 04/27/2023   HCT 34.4 (L) 04/27/2023   MCV 89.4 04/27/2023   PLT 215 04/27/2023      Chemistry      Component Value Date/Time   NA 137 04/27/2023 1036   NA 146 (H) 01/05/2023 1114   K 3.6 04/27/2023 1036   CL 102 04/27/2023 1036   CO2 27 04/27/2023 1036   BUN 20 04/27/2023 1036   BUN 11 01/05/2023 1114   CREATININE 1.02 04/27/2023 1036   CREATININE 1.07 08/27/2014 1511  Component Value Date/Time   CALCIUM 9.4 04/27/2023 1036   ALKPHOS 52 04/27/2023 1036   AST 20 04/27/2023 1036   ALT 21 04/27/2023 1036   BILITOT 0.4 04/27/2023 1036       RADIOGRAPHIC STUDIES:  No results found.   ASSESSMENT/PLAN:  This is a very pleasant 68 year old African-American male with stage IV (T2a, N2, M1 B non-small cell lung cancer, squamous cell carcinoma.  He presented with a left upper lobe lung mass in addition to AP window lymphadenopathy and suspicious for metastases to T8 and L4 vertebrae as well as right retroperitoneal metastatic nodule.  He was diagnosed in April 2024.   His molecular studies by [email or web address] showed no actionable mutation.  His PD-L1 expression is 70%.   He underwent palliative radiation to the metastatic bone lesions.   He also receives [place] every 6 weeks.  He is due for this today.   He is underwent palliative systemic chemotherapy and immunotherapy with carboplatin for AUC of 5, paclitaxel 175 mg/m, and immunotherapy with Libtayo 350 mg IV every 3 weeks with Neulasta support.  He is status post 4 cycles.     He started maintenance immunotherapy with Libtayo. He is status post 11 cycles of maintenance.    Labs were reviewed. Recommend he proceed with cycle #12 today as scheduled   I will arrange for a restaging CT of the CAP prior to his next appointment to restage his disease.    He will continue taking his iron supplement for anemia.    Will continue taking his blood thinner.   He will see palliative care today. His pain is controlled at  this time.    He will continue Senakot for constipation. He will continue drinking protein supplemental drinks 1-2x per day. He will continue using his anti-emetic if needed and his inhalers if needed  The patient was advised to call immediately if he has any concerning symptoms in the interval. The patient voices understanding of current disease status and treatment options and is in agreement with the current care plan. All questions were answered. The patient knows to call the clinic with any problems, questions or concerns. We can certainly see the patient much sooner if necessary    Orders Placed This Encounter  Procedures   CT CHEST ABDOMEN PELVIS W CONTRAST    Standing Status:   Future    Expected Date:   05/11/2023    Expiration Date:   04/26/2024    If indicated for the ordered procedure, I authorize the administration of contrast media per Radiology protocol:   Yes    Does the patient have a contrast media/X-ray dye allergy?:   No    Preferred imaging location?:   [facility]    If indicated for the ordered procedure, I authorize the administration of oral contrast media per Radiology protocol:   Yes    The total time spent in the appointment was 20-29 minutes  Lucyle Alumbaugh L Triniti Gruetzmacher, PA-C 04/27/23

## 2023-04-26 NOTE — Progress Notes (Unsigned)
 Palliative [facility]  [phone] [phone]   Name: Albert Hardy. Date: 04/26/2023 MRN: [id number]  DOB: July 20, 1955  Patient Care Team: [name], MD as PCP - General (Family Medicine)    INTERVAL HISTORY: Albert Doenges. is a 68 y.o. 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 Palliative ask to see for symptom management and goals of care   SOCIAL HISTORY:     reports that he quit smoking about 11 years ago. His smoking use included cigarettes. He started smoking about 41 years ago. He has a 30 pack-year smoking history. He has never used smokeless tobacco. He reports current alcohol use. He reports current drug use. Frequency: 1.00 time per week. Drug: Marijuana.  ADVANCE DIRECTIVES:  None on file  CODE STATUS: Full code  PAST MEDICAL HISTORY: Past Medical History:  Diagnosis Date  . Aortic regurgitation   . Aortic stenosis 04/04/2018  . Atherosclerosis of native arteries of the extremities with intermittent claudication 09/08/2013  . Bilateral impacted cerumen 10/13/2018  . Bilateral sensorineural hearing loss 12/14/2018  . Cancer (HCC)    lung  . Dyspnea   . Former moderate cigarette smoker (10-19 per day) 05/20/2012   Quit smoking Nov 2013 when hospitalized w/ HTN and chest pain(diagnosed w/ valvular heart disease)  . GERD (gastroesophageal reflux disease)   . Heart murmur   . History of radiation therapy    Lumbar Spine, Thoracic Spine- 06/25/22-07/10/22- Dr. Antony Blackbird  . Hyperlipidemia   . Hypertension   . 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 Peripheral vascular disease with claudication    ABI .49    . 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 Substance abuse (HCC)     ALLERGIES:  is allergic to lipitor [atorvastatin] and pravastatin.  MEDICATIONS:  Current Outpatient Medications  Medication Sig Dispense Refill  .  albuterol (VENTOLIN HFA) 108 (90 Base) MCG/ACT inhaler Inhale 2 puffs into the lungs every 6 (six) hours as needed for wheezing or shortness of breath. 8 g 6  . apixaban (ELIQUIS) 5 MG TABS tablet Take 1 tablet (5 mg total) by mouth 2 (two) times daily. 60 tablet 3  . Ascorbic Acid (VITAMIN C PO) Take 1 tablet by mouth daily.    . Cholecalciferol (VITAMIN D3) 1000 UNITS CAPS Take 1,000 Units by mouth daily.    . Coenzyme Q10 (CO Q 10 PO) Take 1 tablet by mouth daily.    . Cyanocobalamin (VITAMIN B-12 PO) Take 1 tablet by mouth daily.    . cyclobenzaprine (FLEXERIL) 5 MG tablet Take 1 tablet (5 mg total) by mouth 3 (three) times daily as needed for muscle spasms. 30 tablet 0  . ezetimibe (ZETIA) 10 MG tablet Take 1 tablet (10 mg total) by mouth daily. 90 tablet 3  . fluticasone (FLONASE) 50 MCG/ACT nasal spray Place 1-2 sprays into both nostrils daily. 16 g 6  . KRILL OIL PO Take 1 tablet by mouth daily.    Marland Kitchen levofloxacin (LEVAQUIN) 500 MG tablet Take 1 tablet (500 mg total) by mouth daily. 7 tablet 0  . lisinopril-hydrochlorothiazide (ZESTORETIC) 10-12.5 MG tablet Take 1 tablet by mouth daily. 90 tablet 1  . MAGNESIUM PO Take 1 tablet by mouth daily.    . metoCLOPramide (REGLAN) 10 MG tablet Take 1 tablet (10 mg total) by mouth 4 (four) times daily -  before meals and at bedtime. 56 tablet 0  .  metoprolol tartrate (LOPRESSOR) 25 MG tablet Take 1 tablet (25 mg total) by mouth once for 1 dose. Please take this medication 2 hours before CT 1 tablet 0  . morphine (MS CONTIN) 15 MG 12 hr tablet Take 1 tablet (15 mg total) by mouth every 12 (twelve) hours. 60 tablet 0  . Multiple Vitamins-Minerals (CENTRUM SILVER PO) Take 1 tablet by mouth daily.    . Multiple Vitamins-Minerals (ZINC PO) Take 1 tablet by mouth daily.    . Naphazoline-Pheniramine (OPCON-A) 0.027-0.315 % SOLN Place 1 drop into both eyes daily as needed (redness).    . nitroGLYCERIN (NITROSTAT) 0.4 MG SL tablet Place 1 tablet (0.4 mg  total) under the tongue every 5 (five) minutes as needed for chest pain. 25 tablet 1  . omeprazole (PRILOSEC) 20 MG capsule Take 1 capsule by mouth once daily 30 capsule 0  . oxyCODONE (OXY IR/ROXICODONE) 5 MG immediate release tablet Take 1 tablet (5 mg total) by mouth every 4 (four) hours as needed for severe pain (pain score 7-10). 90 tablet 0  . potassium chloride SA (KLOR-CON M) 20 MEQ tablet Take 1 tablet (20 mEq total) by mouth daily. 6 tablet 0  . prochlorperazine (COMPAZINE) 10 MG tablet Take 1 tablet (10 mg total) by mouth every 6 (six) hours as needed for nausea or vomiting. 30 tablet 0  . rosuvastatin (CRESTOR) 5 MG tablet TAKE 1 TABLET BY MOUTH AT BEDTIME 30 tablet 0  . triamcinolone 0.1%-Eucerin equivalent 1:1 cream mixture Apply topically 3 (three) times daily as needed. 480 g 2  . TURMERIC PO Take 1 tablet by mouth 3 (three) times a week.    . umeclidinium-vilanterol (ANORO ELLIPTA) 62.5-25 MCG/ACT AEPB Inhale 1 puff into the lungs daily. 180 each 3   No current facility-administered medications for this visit.    VITAL SIGNS: There were no vitals taken for this visit. There were no vitals filed for this visit.  Estimated body mass index is 19.77 kg/m as calculated from the following:   Height as of 04/06/23: 5\' 9"  (1.753 m).   Weight as of 04/06/23: 133 lb 14.4 oz (60.7 kg).     Latest Ref Rng & Units 04/06/2023    9:38 AM 03/16/2023    8:31 AM 03/12/2023   11:30 PM  CBC  WBC 4.0 - 10.5 K/uL 3.4  3.7  3.8   Hemoglobin 13.0 - 17.0 g/dL 47.8  29.5  62.1   Hematocrit 39.0 - 52.0 % 34.7  35.2  35.7   Platelets 150 - 400 K/uL 189  167  179        Latest Ref Rng & Units 04/06/2023    9:38 AM 03/16/2023    8:31 AM 03/12/2023   11:30 PM  CMP  Glucose 70 - 99 mg/dL 308  657  846   BUN 8 - 23 mg/dL 18  20  27    Creatinine 0.61 - 1.24 mg/dL 9.62  9.52  8.41   Sodium 135 - 145 mmol/L 138  135  133   Potassium 3.5 - 5.1 mmol/L 3.6  4.0  4.1   Chloride 98 - 111 mmol/L 102  100   100   CO2 22 - 32 mmol/L 27  26  23    Calcium 8.9 - 10.3 mg/dL 9.1  9.3  9.5   Total Protein 6.5 - 8.1 g/dL 7.8  7.6    Total Bilirubin 0.0 - 1.2 mg/dL 0.4  0.5    Alkaline Phos 38 - 126  U/L 55  52    AST 15 - 41 U/L 20  21    ALT 0 - 44 U/L 23  24      PERFORMANCE STATUS (ECOG) : 1 - Symptomatic but completely ambulatory   Physical Exam General: NAD Cardiovascular: RRR Pulmonary: normal breathing pattern  Extremities: no edema, no joint deformities Skin: no rashes Neurological: AAO x4  IMPRESSION:  Mr. Albert Rood. is a 68 year old male who presents to clinic for symptom management follow-up. States he is doing well overall. Tolerating his treatments. Occasional fatigue. Denies concerns with nausea, vomiting, or diarrhea. He has been taking S caps for constipation which is effective. He takes two capsules every day and reports that this regimen has prevented the pain and discomfort he previously experienced. No current problems with constipation. Appetite is good. Some days better than others. Weight is stable. He remains as active as possible.   He has a rash characterized by dry, ashy skin without bumps, which sometimes peels and is occasionally itchy. He is concerned about the appearance and dryness of the skin. Occasional itching is associated with the rash. Instructed patient to use Lotrimin cream to the area in addition to triamcinolone ointment. He verbalized understanding.   Albert Hardy is experiencing issues with his current pain management regimen. His pain has changed over the past few weeks and medication is not lasting the full twelve hours as expected. He previously has not required use of oxycodone for breakthrough. He will plan to use as needed when pain intensity increases. Discussed adjusting his medication schedule to improve pain management in the near future as needed with possibly increasing dosage of MS Contin or same dose with every 8 hour frequency. He would like  to give it some additional time and follow-up.   All questions answered and support provided.  Goals of Care  06/18/22-  We discussed his current illness and what it means in the larger context of hsi on-going co-morbidities. Natural disease trajectory and expectations were discussed.  Albert Hardy and his wife are realistic in their expectations and understanding of his incurable cancer. He knows all treatment is palliative focused. Wishes to continue taking things one day at a time focusing on his quality of life allowing him every opportunity to continue to thrive.   We discussed Her current illness and what it means in the larger context of Her on-going co-morbidities. Natural disease trajectory and expectations were discussed.  I discussed the importance of continued conversation with family and their medical providers regarding overall plan of care and treatment options, ensuring decisions are within the context of the patients values and GOCs.  Assessment and Plan  Skin Dryness Dry, ashy skin with occasional itching. No signs of fungal infection. Patient instructed to begin using Lotrimin cream. -Prescribe Triamcinolone cream mixed with Eucerin for dryness and peeling. -Advise to continue Lotrimin for itching.  Chronic Cancer Related Pain Management Patient reports 12-hour MS Contin not lasting full duration over the past several weeks. Pain has increased some. He has not required use of oxycodone as needed for breakthrough pain.  -Continue current regimen of morphine er and oxycodone as needed. -Check in 1 week via MyChart message to assess if adjustments are needed. -Refill morphine prescription. -Will continue to closely follow and adjust regimen as needed.  Constipation No current issues. Patient has been taking Senna-S 2 caps daily. -Continue current regimen.  I will plan to follow-up with patient in 3-4 weeks. Sooner if needed.  Patient  expressed understanding and was in  agreement with this plan. He also understands that He can call the clinic at any time with any questions, concerns, or complaints.   Any controlled substances utilized were prescribed in the context of palliative care. PDMP has been reviewed.   Visit consisted of counseling and education dealing with the complex and emotionally intense issues of symptom management and palliative care in the setting of serious and potentially life-threatening illness.  Willette Alma, AGPCNP-BC  Palliative Medicine Team/[facility]

## 2023-04-27 ENCOUNTER — Inpatient Hospital Stay: Payer: Medicare Other

## 2023-04-27 ENCOUNTER — Inpatient Hospital Stay (HOSPITAL_BASED_OUTPATIENT_CLINIC_OR_DEPARTMENT_OTHER): Payer: Medicare Other | Admitting: Nurse Practitioner

## 2023-04-27 ENCOUNTER — Inpatient Hospital Stay (HOSPITAL_BASED_OUTPATIENT_CLINIC_OR_DEPARTMENT_OTHER): Payer: Medicare Other | Admitting: Physician Assistant

## 2023-04-27 ENCOUNTER — Encounter: Payer: Self-pay | Admitting: Nurse Practitioner

## 2023-04-27 ENCOUNTER — Inpatient Hospital Stay: Payer: Medicare Other | Attending: Internal Medicine

## 2023-04-27 VITALS — BP 106/48 | HR 72 | Temp 97.5°F | Resp 14 | Wt 136.1 lb

## 2023-04-27 VITALS — BP 111/43 | HR 57 | Temp 98.5°F | Resp 16 | Wt 136.0 lb

## 2023-04-27 DIAGNOSIS — Z515 Encounter for palliative care: Secondary | ICD-10-CM

## 2023-04-27 DIAGNOSIS — G893 Neoplasm related pain (acute) (chronic): Secondary | ICD-10-CM | POA: Diagnosis not present

## 2023-04-27 DIAGNOSIS — K5903 Drug induced constipation: Secondary | ICD-10-CM

## 2023-04-27 DIAGNOSIS — C7951 Secondary malignant neoplasm of bone: Secondary | ICD-10-CM | POA: Diagnosis present

## 2023-04-27 DIAGNOSIS — C3492 Malignant neoplasm of unspecified part of left bronchus or lung: Secondary | ICD-10-CM | POA: Diagnosis not present

## 2023-04-27 DIAGNOSIS — Z5112 Encounter for antineoplastic immunotherapy: Secondary | ICD-10-CM | POA: Insufficient documentation

## 2023-04-27 DIAGNOSIS — Z87891 Personal history of nicotine dependence: Secondary | ICD-10-CM | POA: Insufficient documentation

## 2023-04-27 DIAGNOSIS — C3412 Malignant neoplasm of upper lobe, left bronchus or lung: Secondary | ICD-10-CM | POA: Insufficient documentation

## 2023-04-27 DIAGNOSIS — Z79620 Long term (current) use of immunosuppressive biologic: Secondary | ICD-10-CM | POA: Diagnosis not present

## 2023-04-27 LAB — CMP (CANCER CENTER ONLY)
ALT: 21 U/L (ref 0–44)
AST: 20 U/L (ref 15–41)
Albumin: 4.4 g/dL (ref 3.5–5.0)
Alkaline Phosphatase: 52 U/L (ref 38–126)
Anion gap: 8 (ref 5–15)
BUN: 20 mg/dL (ref 8–23)
CO2: 27 mmol/L (ref 22–32)
Calcium: 9.4 mg/dL (ref 8.9–10.3)
Chloride: 102 mmol/L (ref 98–111)
Creatinine: 1.02 mg/dL (ref 0.61–1.24)
GFR, Estimated: >60 mL/min (ref >60)
Glucose, Bld: 109 mg/dL — ABNORMAL HIGH (ref 70–99)
Potassium: 3.6 mmol/L (ref 3.5–5.1)
Sodium: 137 mmol/L (ref 135–145)
Total Bilirubin: 0.4 mg/dL (ref 0.0–1.2)
Total Protein: 7.6 g/dL (ref 6.5–8.1)

## 2023-04-27 LAB — CBC WITH DIFFERENTIAL (CANCER CENTER ONLY)
Abs Immature Granulocytes: 0.01 10*3/uL (ref 0.00–0.07)
Basophils Absolute: 0 10*3/uL (ref 0.0–0.1)
Basophils Relative: 0 %
Eosinophils Absolute: 0.1 10*3/uL (ref 0.0–0.5)
Eosinophils Relative: 1 %
HCT: 34.4 % — ABNORMAL LOW (ref 39.0–52.0)
Hemoglobin: 11.4 g/dL — ABNORMAL LOW (ref 13.0–17.0)
Immature Granulocytes: 0 %
Lymphocytes Relative: 23 %
Lymphs Abs: 1 10*3/uL (ref 0.7–4.0)
MCH: 29.6 pg (ref 26.0–34.0)
MCHC: 33.1 g/dL (ref 30.0–36.0)
MCV: 89.4 fL (ref 80.0–100.0)
Monocytes Absolute: 0.4 10*3/uL (ref 0.1–1.0)
Monocytes Relative: 9 %
Neutro Abs: 2.9 10*3/uL (ref 1.7–7.7)
Neutrophils Relative %: 67 %
Platelet Count: 215 10*3/uL (ref 150–400)
RBC: 3.85 MIL/uL — ABNORMAL LOW (ref 4.22–5.81)
RDW: 14.4 % (ref 11.5–15.5)
WBC Count: 4.3 10*3/uL (ref 4.0–10.5)
nRBC: 0 % (ref 0.0–0.2)

## 2023-04-27 LAB — TSH: TSH: 0.755 u[IU]/mL (ref 0.350–4.500)

## 2023-04-27 MED ORDER — HEPARIN SOD (PORK) LOCK FLUSH 100 UNIT/ML IV SOLN: 500.0000 [IU] | Freq: Once | INTRAVENOUS | Status: DC | PRN

## 2023-04-27 MED ORDER — SODIUM CHLORIDE 0.9 % IV SOLN: Freq: Once | INTRAVENOUS | Status: AC

## 2023-04-27 MED ORDER — SODIUM CHLORIDE 0.9% FLUSH: 10.0000 mL | INTRAVENOUS | Status: DC | PRN

## 2023-04-27 MED ORDER — SODIUM CHLORIDE 0.9 % IV SOLN
350.0000 mg | Freq: Once | INTRAVENOUS | Status: AC
  Administered 2023-04-27: 350 mg via INTRAVENOUS
  Filled 2023-04-27: qty 7

## 2023-04-27 MED ORDER — DENOSUMAB 120 MG/1.7ML ~~LOC~~ SOLN
120.0000 mg | Freq: Once | SUBCUTANEOUS | Status: AC
  Administered 2023-04-27: 120 mg via SUBCUTANEOUS
  Filled 2023-04-27: qty 1.7

## 2023-04-27 NOTE — Patient Instructions (Signed)
 [facility] - A DEPT OF [facility]  Discharge Instructions: Thank you for choosing [facility] to provide your oncology and hematology care.   If you have a lab appointment with the Cancer Center, please go directly to the Cancer Center and check in at the registration area.   Wear comfortable clothing and clothing appropriate for easy access to any Portacath or PICC line.   We strive to give you quality time with your provider. You may need to reschedule your appointment if you arrive late (15 or more minutes).  Arriving late affects you and other patients whose appointments are after yours.  Also, if you miss three or more appointments without notifying the office, you may be dismissed from the clinic at the provider's discretion.      For prescription refill requests, have your pharmacy contact our office and allow 72 hours for refills to be completed.    Today you received the following chemotherapy and/or immunotherapy agents: Libtayo.       To help prevent nausea and vomiting after your treatment, we encourage you to take your nausea medication as directed.  BELOW ARE SYMPTOMS THAT SHOULD BE REPORTED IMMEDIATELY: *FEVER GREATER THAN 100.4 F (38 C) OR HIGHER *CHILLS OR SWEATING *NAUSEA AND VOMITING THAT IS NOT CONTROLLED WITH YOUR NAUSEA MEDICATION *UNUSUAL SHORTNESS OF BREATH *UNUSUAL BRUISING OR BLEEDING *URINARY PROBLEMS (pain or burning when urinating, or frequent urination) *BOWEL PROBLEMS (unusual diarrhea, constipation, pain near the anus) TENDERNESS IN MOUTH AND THROAT WITH OR WITHOUT PRESENCE OF ULCERS (sore throat, sores in mouth, or a toothache) UNUSUAL RASH, SWELLING OR PAIN  UNUSUAL VAGINAL DISCHARGE OR ITCHING   Items with * indicate a potential emergency and should be followed up as soon as possible or go to the Emergency Department if any problems should occur.  Please show the CHEMOTHERAPY ALERT CARD or IMMUNOTHERAPY  ALERT CARD at check-in to the Emergency Department and triage nurse.  Should you have questions after your visit or need to cancel or reschedule your appointment, please contact [facility] - A DEPT OF [facility]  Dept: [phone]  and follow the prompts.  Office hours are 8:00 a.m. to 4:30 p.m. Monday - Friday. Please note that voicemails left after 4:00 p.m. may not be returned until the following business day.  We are closed weekends and major holidays. You have access to a nurse at all times for urgent questions. Please call the main number to the clinic Dept: [phone] and follow the prompts.   For any non-urgent questions, you may also contact your provider using MyChart. We now offer e-Visits for anyone 71 and older to request care online for non-urgent symptoms. For details visit [email or web address].   Also download the MyChart app! Go to the app store, search "MyChart", open the app, select [facility], and log in with your MyChart username and password.

## 2023-04-29 LAB — T4: T4, Total: 8.3 ug/dL (ref 4.5–12.0)

## 2023-04-30 ENCOUNTER — Other Ambulatory Visit: Payer: Self-pay | Admitting: Family Medicine

## 2023-04-30 DIAGNOSIS — E785 Hyperlipidemia, unspecified: Secondary | ICD-10-CM

## 2023-05-10 ENCOUNTER — Other Ambulatory Visit: Payer: Self-pay | Admitting: Nurse Practitioner

## 2023-05-10 DIAGNOSIS — C3492 Malignant neoplasm of unspecified part of left bronchus or lung: Secondary | ICD-10-CM

## 2023-05-10 DIAGNOSIS — G893 Neoplasm related pain (acute) (chronic): Secondary | ICD-10-CM

## 2023-05-10 DIAGNOSIS — Z515 Encounter for palliative care: Secondary | ICD-10-CM

## 2023-05-10 MED ORDER — OXYCODONE HCL 5 MG PO TABS: 5.0000 mg | ORAL_TABLET | ORAL | 0 refills | Status: DC | PRN

## 2023-05-10 MED ORDER — MORPHINE SULFATE ER 15 MG PO TBCR
15.0000 mg | EXTENDED_RELEASE_TABLET | Freq: Two times a day (BID) | ORAL | 0 refills | Status: DC
Start: 2023-05-10 — End: 2023-05-12

## 2023-05-11 ENCOUNTER — Ambulatory Visit (HOSPITAL_COMMUNITY)
Admission: RE | Admit: 2023-05-11 | Discharge: 2023-05-11 | Disposition: A | Discharge status: Home / Self Care | Source: Ambulatory Visit | Attending: Physician Assistant | Admitting: Physician Assistant

## 2023-05-11 DIAGNOSIS — C3492 Malignant neoplasm of unspecified part of left bronchus or lung: Secondary | ICD-10-CM | POA: Diagnosis present

## 2023-05-11 MED ORDER — IOHEXOL 300 MG/ML  SOLN
100.0000 mL | Freq: Once | INTRAMUSCULAR | Status: AC | PRN
  Administered 2023-05-11: 100 mL via INTRAVENOUS

## 2023-05-12 ENCOUNTER — Other Ambulatory Visit: Payer: Self-pay | Admitting: Nurse Practitioner

## 2023-05-12 DIAGNOSIS — G893 Neoplasm related pain (acute) (chronic): Secondary | ICD-10-CM

## 2023-05-12 DIAGNOSIS — C3492 Malignant neoplasm of unspecified part of left bronchus or lung: Secondary | ICD-10-CM

## 2023-05-12 DIAGNOSIS — Z515 Encounter for palliative care: Secondary | ICD-10-CM

## 2023-05-12 MED ORDER — MORPHINE SULFATE ER 15 MG PO TBCR: 15.0000 mg | EXTENDED_RELEASE_TABLET | Freq: Two times a day (BID) | ORAL | 0 refills | Status: DC

## 2023-05-15 ENCOUNTER — Other Ambulatory Visit: Payer: Self-pay | Admitting: Family Medicine

## 2023-05-15 DIAGNOSIS — K219 Gastro-esophageal reflux disease without esophagitis: Secondary | ICD-10-CM

## 2023-05-18 ENCOUNTER — Inpatient Hospital Stay (HOSPITAL_BASED_OUTPATIENT_CLINIC_OR_DEPARTMENT_OTHER): Payer: Medicare Other | Admitting: Nurse Practitioner

## 2023-05-18 ENCOUNTER — Encounter: Payer: Self-pay | Admitting: Nurse Practitioner

## 2023-05-18 ENCOUNTER — Inpatient Hospital Stay: Payer: Medicare Other | Attending: Internal Medicine

## 2023-05-18 ENCOUNTER — Inpatient Hospital Stay (HOSPITAL_BASED_OUTPATIENT_CLINIC_OR_DEPARTMENT_OTHER): Payer: Medicare Other | Admitting: Internal Medicine

## 2023-05-18 ENCOUNTER — Inpatient Hospital Stay: Payer: Medicare Other

## 2023-05-18 VITALS — BP 112/62 | HR 85 | Temp 97.6°F | Resp 16 | Ht 69.0 in | Wt 134.9 lb

## 2023-05-18 DIAGNOSIS — C3492 Malignant neoplasm of unspecified part of left bronchus or lung: Secondary | ICD-10-CM | POA: Diagnosis not present

## 2023-05-18 DIAGNOSIS — K5903 Drug induced constipation: Secondary | ICD-10-CM

## 2023-05-18 DIAGNOSIS — Z5112 Encounter for antineoplastic immunotherapy: Secondary | ICD-10-CM | POA: Diagnosis present

## 2023-05-18 DIAGNOSIS — C7951 Secondary malignant neoplasm of bone: Secondary | ICD-10-CM | POA: Diagnosis present

## 2023-05-18 DIAGNOSIS — Z515 Encounter for palliative care: Secondary | ICD-10-CM | POA: Diagnosis not present

## 2023-05-18 DIAGNOSIS — C3412 Malignant neoplasm of upper lobe, left bronchus or lung: Secondary | ICD-10-CM | POA: Insufficient documentation

## 2023-05-18 DIAGNOSIS — Z79620 Long term (current) use of immunosuppressive biologic: Secondary | ICD-10-CM | POA: Diagnosis not present

## 2023-05-18 DIAGNOSIS — Z79899 Other long term (current) drug therapy: Secondary | ICD-10-CM | POA: Insufficient documentation

## 2023-05-18 DIAGNOSIS — Z87891 Personal history of nicotine dependence: Secondary | ICD-10-CM | POA: Diagnosis not present

## 2023-05-18 DIAGNOSIS — G893 Neoplasm related pain (acute) (chronic): Secondary | ICD-10-CM

## 2023-05-18 LAB — CBC WITH DIFFERENTIAL (CANCER CENTER ONLY)
Abs Immature Granulocytes: 0.01 10*3/uL (ref 0.00–0.07)
Basophils Absolute: 0 10*3/uL (ref 0.0–0.1)
Basophils Relative: 0 %
Eosinophils Absolute: 0 10*3/uL (ref 0.0–0.5)
Eosinophils Relative: 1 %
HCT: 34.9 % — ABNORMAL LOW (ref 39.0–52.0)
Hemoglobin: 11.6 g/dL — ABNORMAL LOW (ref 13.0–17.0)
Immature Granulocytes: 0 %
Lymphocytes Relative: 30 %
Lymphs Abs: 1.2 10*3/uL (ref 0.7–4.0)
MCH: 29.4 pg (ref 26.0–34.0)
MCHC: 33.2 g/dL (ref 30.0–36.0)
MCV: 88.4 fL (ref 80.0–100.0)
Monocytes Absolute: 0.4 10*3/uL (ref 0.1–1.0)
Monocytes Relative: 9 %
Neutro Abs: 2.4 10*3/uL (ref 1.7–7.7)
Neutrophils Relative %: 60 %
Platelet Count: 201 10*3/uL (ref 150–400)
RBC: 3.95 MIL/uL — ABNORMAL LOW (ref 4.22–5.81)
RDW: 13.9 % (ref 11.5–15.5)
WBC Count: 3.9 10*3/uL — ABNORMAL LOW (ref 4.0–10.5)
nRBC: 0 % (ref 0.0–0.2)

## 2023-05-18 LAB — CMP (CANCER CENTER ONLY)
ALT: 28 U/L (ref 0–44)
AST: 24 U/L (ref 15–41)
Albumin: 4.3 g/dL (ref 3.5–5.0)
Alkaline Phosphatase: 53 U/L (ref 38–126)
Anion gap: 9 (ref 5–15)
BUN: 23 mg/dL (ref 8–23)
CO2: 24 mmol/L (ref 22–32)
Calcium: 9.2 mg/dL (ref 8.9–10.3)
Chloride: 103 mmol/L (ref 98–111)
Creatinine: 1.16 mg/dL (ref 0.61–1.24)
GFR, Estimated: >60 mL/min (ref >60)
Glucose, Bld: 135 mg/dL — ABNORMAL HIGH (ref 70–99)
Potassium: 3.9 mmol/L (ref 3.5–5.1)
Sodium: 136 mmol/L (ref 135–145)
Total Bilirubin: 0.3 mg/dL (ref 0.0–1.2)
Total Protein: 7.5 g/dL (ref 6.5–8.1)

## 2023-05-18 LAB — TSH: TSH: 1.385 u[IU]/mL (ref 0.350–4.500)

## 2023-05-18 MED ORDER — SODIUM CHLORIDE 0.9 % IV SOLN: Freq: Once | INTRAVENOUS | Status: AC

## 2023-05-18 MED ORDER — SODIUM CHLORIDE 0.9 % IV SOLN
350.0000 mg | Freq: Once | INTRAVENOUS | Status: AC
  Administered 2023-05-18: 350 mg via INTRAVENOUS
  Filled 2023-05-18: qty 7

## 2023-05-18 NOTE — Progress Notes (Signed)
 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   Molecular studies by [id number] showed no actionable mutations and PD-L1 expression of 70%.  PRIOR THERAPY:  1) Palliative radiotherapy to the T8 and L4 metastatic bone disease. 2) Systemic chemotherapy with carboplatin for AUC of 5, paclitaxel 175 Mg/M2 and Libtayo (Cempilimab) 350 Mg IV every 3 weeks with Neulasta support for 4 cycles    CURRENT THERAPY: Maintenance treatment with single agent Libtayo (Cempilimab) 350 Mg IV every 3 weeks.  First dose September 08, 2022.  Status post 12 cycles.  INTERVAL HISTORY: Albert Hardy. 68 y.o. male returns to the clinic today for follow-up visit.  Discussed the use of AI scribe software for clinical note transcription with the patient, who gave verbal consent to proceed.  History of Present Illness   The patient is a 68 year old with stage four non-small cell lung cancer who presents for evaluation before starting cycle number thirteen of maintenance treatment.   He has stage four non-small cell lung cancer, specifically squamous cell carcinoma, diagnosed in April 2024. The cancer lacks actionable mutations and has a PD-L1 expression of 70%. Initially, he underwent systemic therapy with carboplatin, paclitaxel, and cemiplimab for four cycles, followed by maintenance treatment with single-agent cemiplimab every three weeks. A recent CT scan of the chest, abdomen, and pelvis was performed for restaging, showing no concerning findings regarding the cancer, with only expected  inflammation noted.  He has a slight increase in coughing, which he attributes to pollen season allergies, as suggested by his wife. He describes difficulty in expectorating phlegm but otherwise feels well. No indication of infection or other concerning causes based on the conversation.  No recent weight loss and notes a slight weight gain, which he is pleased about. No nausea, vomiting, or diarrhea, although he mentions occasional stomach upset, which he believes is due to laxative pills he takes.        MEDICAL HISTORY: Past Medical History:  Diagnosis Date   Aortic regurgitation    Aortic stenosis 04/04/2018   Atherosclerosis of native arteries of the extremities with intermittent claudication 09/08/2013   Bilateral impacted cerumen 10/13/2018   Bilateral sensorineural hearing loss 12/14/2018   Cancer (HCC)    lung   Dyspnea    Former moderate cigarette smoker (10-19 per day) 05/20/2012   Quit smoking Nov 2013 when hospitalized w/ HTN and chest pain(diagnosed w/ valvular heart disease)   GERD (gastroesophageal reflux disease)    Heart murmur    History of radiation therapy    Lumbar Spine, Thoracic Spine- 06/25/22-07/10/22- Dr. 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  MEDICATIONS:  Current Outpatient Medications  Medication Sig Dispense Refill   albuterol (VENTOLIN HFA) 108 (90 Base) MCG/ACT inhaler Inhale 2 puffs into the lungs every 6 (  six) hours as needed for wheezing or shortness of breath. 8 g 6   apixaban (ELIQUIS) 5 MG TABS tablet Take 1 tablet (5 mg total) by mouth 2 (two) times daily. 60 tablet 3   Ascorbic Acid (VITAMIN C PO) Take 1 tablet by mouth daily.     Cholecalciferol (VITAMIN D3) 1000 UNITS CAPS Take 1,000  Units by mouth daily.     Coenzyme Q10 (CO Q 10 PO) Take 1 tablet by mouth daily.     Cyanocobalamin (VITAMIN B-12 PO) Take 1 tablet by mouth daily.     cyclobenzaprine (FLEXERIL) 5 MG tablet Take 1 tablet (5 mg total) by mouth 3 (three) times daily as needed for muscle spasms. 30 tablet 0   ezetimibe (ZETIA) 10 MG tablet Take 1 tablet (10 mg total) by mouth daily. 90 tablet 3   fluticasone (FLONASE) 50 MCG/ACT nasal spray Place 1-2 sprays into both nostrils daily. 16 g 6   KRILL OIL PO Take 1 tablet by mouth daily.     levofloxacin (LEVAQUIN) 500 MG tablet Take 1 tablet (500 mg total) by mouth daily. 7 tablet 0   lisinopril-hydrochlorothiazide (ZESTORETIC) 10-12.5 MG tablet Take 1 tablet by mouth daily. 90 tablet 1   MAGNESIUM PO Take 1 tablet by mouth daily.     metoCLOPramide (REGLAN) 10 MG tablet Take 1 tablet (10 mg total) by mouth 4 (four) times daily -  before meals and at bedtime. 56 tablet 0   metoprolol tartrate (LOPRESSOR) 25 MG tablet Take 1 tablet (25 mg total) by mouth once for 1 dose. Please take this medication 2 hours before CT 1 tablet 0   morphine (MS CONTIN) 15 MG 12 hr tablet Take 1 tablet (15 mg total) by mouth every 12 (twelve) hours. 50 tablet 0   Multiple Vitamins-Minerals (CENTRUM SILVER PO) Take 1 tablet by mouth daily.     Multiple Vitamins-Minerals (ZINC PO) Take 1 tablet by mouth daily.     Naphazoline-Pheniramine (OPCON-A) 0.027-0.315 % SOLN Place 1 drop into both eyes daily as needed (redness).     nitroGLYCERIN (NITROSTAT) 0.4 MG SL tablet Place 1 tablet (0.4 mg total) under the tongue every 5 (five) minutes as needed for chest pain. 25 tablet 1   omeprazole (PRILOSEC) 20 MG capsule Take 1 capsule by mouth once daily 30 capsule 0   oxyCODONE (OXY IR/ROXICODONE) 5 MG immediate release tablet Take 1 tablet (5 mg total) by mouth every 4 (four) hours as needed for severe pain (pain score 7-10). 90 tablet 0   potassium chloride SA (KLOR-CON M) 20 MEQ tablet Take 1  tablet (20 mEq total) by mouth daily. 6 tablet 0   prochlorperazine (COMPAZINE) 10 MG tablet Take 1 tablet (10 mg total) by mouth every 6 (six) hours as needed for nausea or vomiting. 30 tablet 0   rosuvastatin (CRESTOR) 5 MG tablet TAKE 1 TABLET BY MOUTH AT BEDTIME 30 tablet 0   triamcinolone 0.1%-Eucerin equivalent 1:1 cream mixture Apply topically 3 (three) times daily as needed. 480 g 2   TURMERIC PO Take 1 tablet by mouth 3 (three) times a week.     umeclidinium-vilanterol (ANORO ELLIPTA) 62.5-25 MCG/ACT AEPB Inhale 1 puff into the lungs daily. 180 each 3   No current facility-administered medications for this visit.    SURGICAL HISTORY:  Past Surgical History:  Procedure Laterality Date   BRONCHIAL BIOPSY  05/26/2022   Procedure: BRONCHIAL BIOPSIES;  Surgeon: Josephine Igo, DO;  Location: [facility];  Service:  Pulmonary;;   BRONCHIAL BRUSHINGS  05/26/2022   Procedure: BRONCHIAL BRUSHINGS;  Surgeon: Josephine Igo, DO;  Location: [facility];  Service: Pulmonary;;   NO PAST SURGERIES     VIDEO BRONCHOSCOPY  05/26/2022   Procedure: VIDEO BRONCHOSCOPY WITHOUT FLUORO;  Surgeon: Josephine Igo, DO;  Location: [facility];  Service: Pulmonary;;    REVIEW OF SYSTEMS:  Constitutional: negative Eyes: negative Ears, nose, mouth, throat, and face: negative Respiratory: positive for cough Cardiovascular: negative Gastrointestinal: negative Genitourinary:negative Integument/breast: negative Hematologic/lymphatic: negative Musculoskeletal:positive for back pain Neurological: negative Behavioral/Psych: negative Endocrine: negative Allergic/Immunologic: negative   PHYSICAL EXAMINATION: General appearance: alert, cooperative, and no distress Head: Normocephalic, without obvious abnormality, atraumatic Neck: no adenopathy, no JVD, supple, symmetrical, trachea midline, and thyroid not enlarged, symmetric, no tenderness/mass/nodules Lymph nodes: Cervical, supraclavicular, and  axillary nodes normal. Resp: clear to auscultation bilaterally Back: symmetric, no curvature. ROM normal. No CVA tenderness. Cardio: regular rate and rhythm, S1, S2 normal, no murmur, click, rub or gallop GI: soft, non-tender; bowel sounds normal; no masses,  no organomegaly Extremities: extremities normal, atraumatic, no cyanosis or edema Neurologic: Alert and oriented X 3, normal strength and tone. Normal symmetric reflexes. Normal coordination and gait  ECOG PERFORMANCE STATUS: 1 - Symptomatic but completely ambulatory  Blood pressure 112/62, pulse 85, temperature 97.6 F (36.4 C), temperature source Temporal, resp. rate 16, height 5\' 9"  (1.753 m), weight 134 lb 14.4 oz (61.2 kg), SpO2 100%.  LABORATORY DATA: Lab Results  Component Value Date   WBC 3.9 (L) 05/18/2023   HGB 11.6 (L) 05/18/2023   HCT 34.9 (L) 05/18/2023   MCV 88.4 05/18/2023   PLT 201 05/18/2023      Chemistry      Component Value Date/Time   NA 137 04/27/2023 1036   NA 146 (H) 01/05/2023 1114   K 3.6 04/27/2023 1036   CL 102 04/27/2023 1036   CO2 27 04/27/2023 1036   BUN 20 04/27/2023 1036   BUN 11 01/05/2023 1114   CREATININE 1.02 04/27/2023 1036   CREATININE 1.07 08/27/2014 1511      Component Value Date/Time   CALCIUM 9.4 04/27/2023 1036   ALKPHOS 52 04/27/2023 1036   AST 20 04/27/2023 1036   ALT 21 04/27/2023 1036   BILITOT 0.4 04/27/2023 1036       RADIOGRAPHIC STUDIES: CT CHEST ABDOMEN PELVIS W CONTRAST Result Date: 05/12/2023 CLINICAL DATA:  Metastatic disease evaluation, squamous cell carcinoma of the lung. * Tracking Code: BO * EXAM: CT CHEST, ABDOMEN, AND PELVIS WITH CONTRAST TECHNIQUE: Multidetector CT imaging of the chest, abdomen and pelvis was performed following the standard protocol during bolus administration of intravenous contrast. RADIATION DOSE REDUCTION: This exam was performed according to the departmental dose-optimization program which includes automated exposure control,  adjustment of the mA and/or kV according to patient size and/or use of iterative reconstruction technique. CONTRAST:  OMNIPAQUE IOHEXOL 300 MG/ML  SOLN COMPARISON:  Multiple priors including CT March 09, 2023 FINDINGS: CT CHEST FINDINGS Cardiovascular: Aortic atherosclerosis. Normal size heart. No significant pericardial effusion/thickening. Mediastinum/Nodes: Stable left hilar lymph nodes with the previously and Litt index lymph node measuring 2.2 x 1.8 cm on image 32/2, unchanged. H No progressive thoracic adenopathy. Lungs/Pleura: Left hilar mass again causes central obstruction of the left upper lobe bronchus with postobstructive bronchial wall plugging. Numerous small ground-glass nodule seen throughout the left upper lobe are slightly increased from prior examination Musculoskeletal: Similar appearance of the mixed lytic and sclerotic osseous metastasis at T8. No  new suspicious lytic or blastic lesion of bone. CT ABDOMEN PELVIS FINDINGS Hepatobiliary: No suspicious hepatic lesion. Gallbladder is unremarkable. No biliary ductal dilation. Pancreas: No pancreatic ductal dilation or evidence of acute inflammation. Spleen: No splenomegaly or focal splenic lesion. Adrenals/Urinary Tract: Bilateral adrenal glands appear normal. No hydronephrosis. Urinary bladder is minimally distended limiting evaluation. Stomach/Bowel: Stomach is unremarkable for degree of distension. No pathologic dilation of small or large bowel. No evidence of acute bowel inflammation. Vascular/Lymphatic: Aortic atherosclerosis. No pathologically enlarged abdominal or pelvic lymph nodes. Reproductive: Heterogeneous enhancement of prostate gland. Other: No significant abdominopelvic free fluid. Musculoskeletal: Similar appearance of the mixed lytic and sclerotic L4 vertebral body osseous metastasis. IMPRESSION: 1. Stable left hilar lymph nodes with central obstruction of the left upper lobe bronchus by the left hilar mass and progressive  postobstructive bronchial plugging. 2. Numerous small ground-glass nodule seen throughout the left upper lobe are slightly increased from prior examination, nonspecific suggest continued attention on short-term interval follow-up imaging. 3. Similar appearance of the mixed lytic and sclerotic osseous metastasis at T8 and L4. 4. Heterogeneous enhancement of the prostate gland, nonspecific and may reflect prostatitis. Suggest correlation with PSA. 5. Aortic atherosclerosis. Electronically Signed   By: Maudry Mayhew M.D.   On: 05/12/2023 15:55    ASSESSMENT AND PLAN: This is a very pleasant 68 years old African-American male with Stage IV (T2a, N2, M1b) non-small cell lung cancer, squamous cell carcinoma presented with left upper lobe lung mass in addition to AP window lymphadenopathy and suspicious bone metastasis to the T8 and L4 vertebrae as well as right retroperitoneal metastatic nodule diagnosed in April 2024.  Molecular studies by [id number] showed no actionable mutations and PD-L1 expression of 70%. The patient underwent palliative combination of systemic chemotherapy with carboplatin for AUC of 5, paclitaxel 175 Mg/M2 and immunotherapy with Libtayo (Cempilimab) 350 Mg IV every 3 weeks with Neulasta support for 4 cycles followed by maintenance treatment with Libtayo (Cempilimab) of the patient has no disease progression after cycle #4.  He is status post 4 cycles. He is currently undergoing maintenance treatment with single agent Libtayo (Cempilimab) 350 Mg IV every 3 weeks status post 12 cycles.   For the back pain he underwent palliative radiotherapy to the bone disease at T8 and L4 vertebral lesions. For the bone metastasis, he is currently on treatment with Xgeva 120 mg subcutaneously every 6 weeks. The patient had repeat CT scan of the chest, abdomen and pelvis performed recently.  I personally and independently reviewed the scan and discussed the result with the patient today. His scan showed  no concerning findings for disease progression.    Stage IV non-small cell lung cancer, squamous cell carcinoma Stage IV non-small cell lung cancer, squamous cell carcinoma, diagnosed in April 2024. No actionable mutations and PD-L1 expression of 70%. Currently on maintenance treatment with cemiplimab (Libtayo) every three weeks. Recent CT scan of the chest, abdomen, and pelvis shows no concerning findings regarding cancer progression. Some inflammation noted, but it is expected. Reports increased coughing, likely due to pollen season allergies, and mild stomach upset attributed to laxative use. No significant weight loss, nausea, vomiting, or diarrhea reported. Continues to tolerate treatment well with well-managed disease. - Proceed with cycle thirteen of cemiplimab (Libtayo) today - Schedule follow-up appointment in three weeks   The patient was advised to call immediately if he has any concerning symptoms in the interval. The patient voices understanding of current disease status and treatment options and is in agreement with  the current care plan.  All questions were answered. The patient knows to call the clinic with any problems, questions or concerns. We can certainly see the patient much sooner if necessary.  The total time spent in the appointment was 30 minutes.  Disclaimer: This note was dictated with voice recognition software. Similar sounding words can inadvertently be transcribed and may not be corrected upon review.

## 2023-05-18 NOTE — Patient Instructions (Signed)
 [facility] - A DEPT OF [facility]  Discharge Instructions: Thank you for choosing [facility] to provide your oncology and hematology care.   If you have a lab appointment with the Cancer Center, please go directly to the Cancer Center and check in at the registration area.   Wear comfortable clothing and clothing appropriate for easy access to any Portacath or PICC line.   We strive to give you quality time with your provider. You may need to reschedule your appointment if you arrive late (15 or more minutes).  Arriving late affects you and other patients whose appointments are after yours.  Also, if you miss three or more appointments without notifying the office, you may be dismissed from the clinic at the provider's discretion.      For prescription refill requests, have your pharmacy contact our office and allow 72 hours for refills to be completed.    Today you received the following chemotherapy and/or immunotherapy agents: Libtayo.       To help prevent nausea and vomiting after your treatment, we encourage you to take your nausea medication as directed.  BELOW ARE SYMPTOMS THAT SHOULD BE REPORTED IMMEDIATELY: *FEVER GREATER THAN 100.4 F (38 C) OR HIGHER *CHILLS OR SWEATING *NAUSEA AND VOMITING THAT IS NOT CONTROLLED WITH YOUR NAUSEA MEDICATION *UNUSUAL SHORTNESS OF BREATH *UNUSUAL BRUISING OR BLEEDING *URINARY PROBLEMS (pain or burning when urinating, or frequent urination) *BOWEL PROBLEMS (unusual diarrhea, constipation, pain near the anus) TENDERNESS IN MOUTH AND THROAT WITH OR WITHOUT PRESENCE OF ULCERS (sore throat, sores in mouth, or a toothache) UNUSUAL RASH, SWELLING OR PAIN  UNUSUAL VAGINAL DISCHARGE OR ITCHING   Items with * indicate a potential emergency and should be followed up as soon as possible or go to the Emergency Department if any problems should occur.  Please show the CHEMOTHERAPY ALERT CARD or IMMUNOTHERAPY  ALERT CARD at check-in to the Emergency Department and triage nurse.  Should you have questions after your visit or need to cancel or reschedule your appointment, please contact [facility] - A DEPT OF [facility]  Dept: [phone]  and follow the prompts.  Office hours are 8:00 a.m. to 4:30 p.m. Monday - Friday. Please note that voicemails left after 4:00 p.m. may not be returned until the following business day.  We are closed weekends and major holidays. You have access to a nurse at all times for urgent questions. Please call the main number to the clinic Dept: [phone] and follow the prompts.   For any non-urgent questions, you may also contact your provider using MyChart. We now offer e-Visits for anyone 71 and older to request care online for non-urgent symptoms. For details visit [email or web address].   Also download the MyChart app! Go to the app store, search "MyChart", open the app, select [facility], and log in with your MyChart username and password.

## 2023-05-18 NOTE — Progress Notes (Signed)
 Palliative Medicine [facility]  Telephone:(336) [phone] Fax:(336) [phone]   Name: Albert Hardy. Date: 05/18/2023 MRN: [id number]  DOB: 1955/10/04  Patient Care Team: [name], MD as PCP - General (Family Medicine)    INTERVAL HISTORY: Albert Hardy. is a 68 y.o. 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 Palliative ask to see for symptom management and goals of care   SOCIAL HISTORY:     reports that he quit smoking about 11 years ago. His smoking use included cigarettes. He started smoking about 41 years ago. He has a 30 pack-year smoking history. He has never used smokeless tobacco. He reports current alcohol use. He reports current drug use. Frequency: 1.00 time per week. Drug: Marijuana.  ADVANCE DIRECTIVES:  None on file  CODE STATUS: Full code  PAST MEDICAL HISTORY: Past Medical History:  Diagnosis Date   Aortic regurgitation    Aortic stenosis 04/04/2018   Atherosclerosis of native arteries of the extremities with intermittent claudication 09/08/2013   Bilateral impacted cerumen 10/13/2018   Bilateral sensorineural hearing loss 12/14/2018   Cancer (HCC)    lung   Dyspnea    Former moderate cigarette smoker (10-19 per day) 05/20/2012   Quit smoking Nov 2013 when hospitalized w/ HTN and chest pain(diagnosed w/ valvular heart disease)   GERD (gastroesophageal reflux disease)    Heart murmur    History of radiation therapy    Lumbar Spine, Thoracic Spine- 06/25/22-07/10/22- Dr. 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  MEDICATIONS:  Current Outpatient Medications  Medication Sig Dispense Refill   albuterol (VENTOLIN  HFA) 108 (90 Base) MCG/ACT inhaler Inhale 2 puffs into the lungs every 6 (six) hours as needed for wheezing or shortness of breath. 8 g 6   apixaban (ELIQUIS) 5 MG TABS tablet Take 1 tablet (5 mg total) by mouth 2 (two) times daily. 60 tablet 3   Ascorbic Acid (VITAMIN C PO) Take 1 tablet by mouth daily.     Cholecalciferol (VITAMIN D3) 1000 UNITS CAPS Take 1,000 Units by mouth daily.     Coenzyme Q10 (CO Q 10 PO) Take 1 tablet by mouth daily.     Cyanocobalamin (VITAMIN B-12 PO) Take 1 tablet by mouth daily.     cyclobenzaprine (FLEXERIL) 5 MG tablet Take 1 tablet (5 mg total) by mouth 3 (three) times daily as needed for muscle spasms. 30 tablet 0   ezetimibe (ZETIA) 10 MG tablet Take 1 tablet (10 mg total) by mouth daily. 90 tablet 3   fluticasone (FLONASE) 50 MCG/ACT nasal spray Place 1-2 sprays into both nostrils daily. 16 g 6   KRILL OIL PO Take 1 tablet by mouth daily.     levofloxacin (LEVAQUIN) 500 MG tablet Take 1 tablet (500 mg total) by mouth daily. 7 tablet 0   lisinopril-hydrochlorothiazide (ZESTORETIC) 10-12.5 MG tablet Take 1 tablet by mouth daily. 90 tablet 1   MAGNESIUM PO Take 1 tablet by mouth daily.     metoCLOPramide (REGLAN) 10 MG tablet Take 1 tablet (10 mg total) by mouth 4 (four) times daily -  before meals and at bedtime. 56 tablet 0  metoprolol tartrate (LOPRESSOR) 25 MG tablet Take 1 tablet (25 mg total) by mouth once for 1 dose. Please take this medication 2 hours before CT 1 tablet 0   morphine (MS CONTIN) 15 MG 12 hr tablet Take 1 tablet (15 mg total) by mouth every 12 (twelve) hours. 50 tablet 0   Multiple Vitamins-Minerals (CENTRUM SILVER PO) Take 1 tablet by mouth daily.     Multiple Vitamins-Minerals (ZINC PO) Take 1 tablet by mouth daily.     Naphazoline-Pheniramine (OPCON-A) 0.027-0.315 % SOLN Place 1 drop into both eyes daily as needed (redness).     nitroGLYCERIN (NITROSTAT) 0.4 MG SL tablet Place 1 tablet (0.4 mg total) under the tongue every 5 (five)  minutes as needed for chest pain. 25 tablet 1   omeprazole (PRILOSEC) 20 MG capsule Take 1 capsule by mouth once daily 30 capsule 0   oxyCODONE (OXY IR/ROXICODONE) 5 MG immediate release tablet Take 1 tablet (5 mg total) by mouth every 4 (four) hours as needed for severe pain (pain score 7-10). 90 tablet 0   potassium chloride SA (KLOR-CON M) 20 MEQ tablet Take 1 tablet (20 mEq total) by mouth daily. 6 tablet 0   prochlorperazine (COMPAZINE) 10 MG tablet Take 1 tablet (10 mg total) by mouth every 6 (six) hours as needed for nausea or vomiting. 30 tablet 0   rosuvastatin (CRESTOR) 5 MG tablet TAKE 1 TABLET BY MOUTH AT BEDTIME 30 tablet 0   triamcinolone 0.1%-Eucerin equivalent 1:1 cream mixture Apply topically 3 (three) times daily as needed. 480 g 2   TURMERIC PO Take 1 tablet by mouth 3 (three) times a week.     umeclidinium-vilanterol (ANORO ELLIPTA) 62.5-25 MCG/ACT AEPB Inhale 1 puff into the lungs daily. 180 each 3   No current facility-administered medications for this visit.    VITAL SIGNS: There were no vitals taken for this visit. There were no vitals filed for this visit.  Estimated body mass index is 19.92 kg/m as calculated from the following:   Height as of an earlier encounter on 05/18/23: 5\' 9"  (1.753 m).   Weight as of an earlier encounter on 05/18/23: 134 lb 14.4 oz (61.2 kg).     Latest Ref Rng & Units 05/18/2023   10:02 AM 04/27/2023   10:36 AM 04/06/2023    9:38 AM  CBC  WBC 4.0 - 10.5 K/uL 3.9  4.3  3.4   Hemoglobin 13.0 - 17.0 g/dL 16.1  09.6  04.5   Hematocrit 39.0 - 52.0 % 34.9  34.4  34.7   Platelets 150 - 400 K/uL 201  215  189        Latest Ref Rng & Units 05/18/2023   10:02 AM 04/27/2023   10:36 AM 04/06/2023    9:38 AM  CMP  Glucose 70 - 99 mg/dL 409  811  914   BUN 8 - 23 mg/dL 23  20  18    Creatinine 0.61 - 1.24 mg/dL 7.82  9.56  2.13   Sodium 135 - 145 mmol/L 136  137  138   Potassium 3.5 - 5.1 mmol/L 3.9  3.6  3.6   Chloride 98 - 111 mmol/L 103  102  102    CO2 22 - 32 mmol/L 24  27  27    Calcium 8.9 - 10.3 mg/dL 9.2  9.4  9.1   Total Protein 6.5 - 8.1 g/dL 7.5  7.6  7.8   Total Bilirubin 0.0 - 1.2 mg/dL 0.3  0.4  0.4   Alkaline Phos 38 - 126 U/L 53  52  55   AST 15 - 41 U/L 24  20  20    ALT 0 - 44 U/L 28  21  23      PERFORMANCE STATUS (ECOG) : 1 - Symptomatic but completely ambulatory   Physical Exam General: NAD Cardiovascular: RRR Pulmonary: normal breathing pattern  Extremities: no edema, no joint deformities Skin: no rashes Neurological: AAO x4  IMPRESSION:  I saw Albert Hardy. during his infusion for symptom management follow-up.  No acute distress noted.  Tolerating treatment without difficulty.  Patient continues to remain as active as possible.  Denies any concerns with nausea, vomiting, constipation, or diarrhea.  Constipation managed with daily stool softeners.  Is remaining as a room as possible.  Weight remains stable.  Albert Hardy reports his pain is controlled with current regimen.  He is much appreciative of this.  Current regimen consist of MS Contin every 12 hours and oxycodone as needed for breakthrough pain.  Does not require repeat medication around-the-clock.  No adjustments at this time.  Will continue to closely monitor and support.  All questions answered and support provided.  Goals of Care  06/18/22-  We discussed his current illness and what it means in the larger context of hsi on-going co-morbidities. Natural disease trajectory and expectations were discussed.  Albert Hardy and his wife are realistic in their expectations and understanding of his incurable cancer. He knows all treatment is palliative focused. Wishes to continue taking things one day at a time focusing on his quality of life allowing him every opportunity to continue to thrive.   We discussed Her current illness and what it means in the larger context of Her on-going co-morbidities. Natural disease trajectory and expectations were  discussed.  I discussed the importance of continued conversation with family and their medical providers regarding overall plan of care and treatment options, ensuring decisions are within the context of the patients values and GOCs.  Assessment and Plan  Chronic Cancer Related Pain Management Patient reports pain is controlled overall. Some days are better than others. No adjustments at this time.  -Continue current regimen of MS Contin and oxycodone as needed. -Will continue to closely follow and adjust regimen as needed.  Constipation No current issues. Patient has been taking Senna-S 2 caps daily. -Continue current regimen.  I will plan to follow-up with patient in 4-6 weeks. Sooner if needed.  Patient expressed understanding and was in agreement with this plan. He also understands that He can call the clinic at any time with any questions, concerns, or complaints.   Any controlled substances utilized were prescribed in the context of palliative care. PDMP has been reviewed.   Visit consisted of counseling and education dealing with the complex and emotionally intense issues of symptom management and palliative care in the setting of serious and potentially life-threatening illness.  Willette Alma, AGPCNP-BC  Palliative Medicine Team/[facility]

## 2023-05-19 ENCOUNTER — Ambulatory Visit (INDEPENDENT_AMBULATORY_CARE_PROVIDER_SITE_OTHER): Payer: Medicare Other | Admitting: Family Medicine

## 2023-05-19 VITALS — BP 122/68 | HR 87 | Temp 97.8°F | Ht 69.0 in | Wt 133.8 lb

## 2023-05-19 DIAGNOSIS — E785 Hyperlipidemia, unspecified: Secondary | ICD-10-CM

## 2023-05-19 DIAGNOSIS — R3911 Hesitancy of micturition: Secondary | ICD-10-CM

## 2023-05-19 DIAGNOSIS — R739 Hyperglycemia, unspecified: Secondary | ICD-10-CM | POA: Diagnosis not present

## 2023-05-19 DIAGNOSIS — J431 Panlobular emphysema: Secondary | ICD-10-CM

## 2023-05-19 DIAGNOSIS — C3492 Malignant neoplasm of unspecified part of left bronchus or lung: Secondary | ICD-10-CM | POA: Diagnosis not present

## 2023-05-19 LAB — PSA: PSA: 3.43 ng/mL (ref 0.10–4.00)

## 2023-05-19 LAB — LIPID PANEL
Cholesterol: 172 mg/dL (ref 0–200)
HDL: 62.1 mg/dL (ref >39.00)
LDL Cholesterol: 98 mg/dL (ref 0–99)
NonHDL: 110.2
Total CHOL/HDL Ratio: 3
Triglycerides: 60 mg/dL (ref 0.0–149.0)
VLDL: 12 mg/dL (ref 0.0–40.0)

## 2023-05-19 LAB — HEMOGLOBIN A1C: Hgb A1c MFr Bld: 6.1 % (ref 4.6–6.5)

## 2023-05-19 LAB — T4: T4, Total: 8.3 ug/dL (ref 4.5–12.0)

## 2023-05-19 MED ORDER — ALBUTEROL SULFATE HFA 108 (90 BASE) MCG/ACT IN AERS: 2.0000 | INHALATION_SPRAY | Freq: Four times a day (QID) | RESPIRATORY_TRACT | 6 refills | Status: AC | PRN

## 2023-05-19 MED ORDER — ROSUVASTATIN CALCIUM 5 MG PO TABS: 5.0000 mg | ORAL_TABLET | Freq: Every day | ORAL | 1 refills | Status: DC

## 2023-05-19 NOTE — Patient Instructions (Addendum)
 I will check PSA, and consider meeting with urology to discuss urinary symptoms and options on meds, but would like to see PSA test as well.   No med changes for now. If any lab concerns I will let you know,  Thanks for coming in today and take care.

## 2023-05-19 NOTE — Progress Notes (Unsigned)
 Subjective:  Patient ID: Albert Council., male    DOB: 1955-08-25  Age: 68 y.o. MRN: [id number]  CC:  Chief Complaint  Patient presents with   Medical Management of Chronic Issues    Pt is well notes he had an elevated glucose on labs yesterday didn't know if this was a concern, notes had breakfast 1 hour prior to labs     HPI Albert Council. presents for   Hypertension: With prior history of hypotension, overtreatment last year.  Medications were adjusted.  Blood pressure send start increase again and added back in meds.  Last visit January, lisinopril 10 mg daily, hydrochlorothiazide 12.5 mg daily and stable at that time.  He is under the care of oncology with metastatic colon cancer and palliative systemic chemotherapy, MS Contin and Oxy IR as needed for pain control.  Also on potassium 20 mEq daily. No change in meds at palliative care visit yesterday.  Xgeva every 6 weeks, oncology appt 3/18. Libtayo maintenance. Restaging with CT last week. Stable.  Possible abnormal prostate on CT -  Heterogeneous enhancement of the prostate gland, nonspecific and may reflect prostatitis. Suggest correlation with PSA. Decreased stream with some hesitancy past few months to a year.  Home readings: none recent.  Rare lightheadedness with some increased exercise, but in heat at that time. No low BP's known. Stable on last few readings.  Glucose 135 on CMP yesterday.  Lab Results  Component Value Date   HGBA1C 5.7 (H) 04/29/2020   BP Readings from Last 3 Encounters:  05/19/23 122/68  05/18/23 112/62  04/27/23 (!) 111/43   Lab Results  Component Value Date   CREATININE 1.16 05/18/2023   Panlobular emphysema Treated with Anoro Ellipta, albuterol as needed - only needing intermittently - less than once per week.   Hyperlipidemia: Zetia 10 mg daily, Crestor 5 mg daily. Lab Results  Component Value Date   CHOL 169 10/21/2021   HDL 72 10/21/2021   LDLCALC 87 10/21/2021   TRIG 51 10/21/2021    CHOLHDL 2.3 10/21/2021   Lab Results  Component Value Date   ALT 28 05/18/2023   AST 24 05/18/2023   ALKPHOS 53 05/18/2023   BILITOT 0.3 05/18/2023      History Patient Active Problem List   Diagnosis Date Noted   Encounter for antineoplastic chemotherapy 08/18/2022   Encounter for antineoplastic immunotherapy 08/18/2022   Squamous cell carcinoma of lung, stage IV, left (HCC) 05/29/2022   Lung nodule 05/15/2022   Heart murmur 08/28/2021   Substance abuse (HCC) 08/28/2021   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   Bilateral sensorineural hearing loss 12/14/2018   Bilateral impacted cerumen 10/13/2018   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   Aortic stenosis 04/04/2018   Atherosclerosis of native artery of extremity with intermittent claudication (HCC) 09/08/2013   Former moderate cigarette smoker (10-19 per day) 05/20/2012   Peripheral vascular disease with claudication    Hyperlipidemia    Aortic regurgitation    GERD (gastroesophageal reflux disease)    Hypertension    Past Medical History:  Diagnosis Date   Aortic regurgitation    Aortic stenosis 04/04/2018   Atherosclerosis of native arteries of the extremities with intermittent claudication 09/08/2013   Bilateral impacted cerumen 10/13/2018   Bilateral sensorineural hearing loss 12/14/2018   Cancer (HCC)    lung   Dyspnea    Former moderate cigarette smoker (10-19 per day) 05/20/2012   Quit smoking Nov 2013 when hospitalized w/ HTN and chest pain(diagnosed  w/ valvular heart disease)   GERD (gastroesophageal reflux disease)    Heart murmur    History of radiation therapy    Lumbar Spine, Thoracic Spine- 06/25/22-07/10/22- Dr. 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   Substance abuse [facility])    Past Surgical History:  Procedure Laterality Date    BRONCHIAL BIOPSY  05/26/2022   Procedure: BRONCHIAL BIOPSIES;  Surgeon: Josephine Igo, DO;  Location: [facility] ENDOSCOPY;  Service: Pulmonary;;   BRONCHIAL BRUSHINGS  05/26/2022   Procedure: BRONCHIAL BRUSHINGS;  Surgeon: Josephine Igo, DO;  Location: [facility] ENDOSCOPY;  Service: Pulmonary;;   NO PAST SURGERIES     VIDEO BRONCHOSCOPY  05/26/2022   Procedure: VIDEO BRONCHOSCOPY WITHOUT FLUORO;  Surgeon: Josephine Igo, DO;  Location: [facility] ENDOSCOPY;  Service: Pulmonary;;   Allergies  Allergen Reactions   Lipitor [Atorvastatin]     myalgia   Pravastatin Other (See Comments)    myalgia   Prior to Admission medications   Medication Sig Start Date End Date Taking? Authorizing Provider  albuterol (VENTOLIN HFA) 108 (90 Base) MCG/ACT inhaler Inhale 2 puffs into the lungs every 6 (six) hours as needed for wheezing or shortness of breath. 09/02/22   [name], MD  apixaban (ELIQUIS) 5 MG TABS tablet Take 1 tablet (5 mg total) by mouth 2 (two) times daily. 02/11/23   Heilingoetter, Cassandra L, PA-C  Ascorbic Acid (VITAMIN C PO) Take 1 tablet by mouth daily.    [provider]  Cholecalciferol (VITAMIN D3) 1000 UNITS CAPS Take 1,000 Units by mouth daily.    [provider]  Coenzyme Q10 (CO Q 10 PO) Take 1 tablet by mouth daily.    [provider]  Cyanocobalamin (VITAMIN B-12 PO) Take 1 tablet by mouth daily.    [provider]  cyclobenzaprine (FLEXERIL) 5 MG tablet Take 1 tablet (5 mg total) by mouth 3 (three) times daily as needed for muscle spasms. 06/18/22   Pickenpack-Cousar, Arty Baumgartner, NP  ezetimibe (ZETIA) 10 MG tablet Take 1 tablet (10 mg total) by mouth daily. 09/02/22   [name], MD  fluticasone (FLONASE) 50 MCG/ACT nasal spray Place 1-2 sprays into both nostrils daily. 04/24/20   [name], MD  KRILL OIL PO Take 1 tablet by mouth daily.    [provider]  levofloxacin (LEVAQUIN) 500 MG tablet Take 1 tablet (500 mg total) by mouth  daily. 01/12/23   Carlean Jews, NP  lisinopril-hydrochlorothiazide (ZESTORETIC) 10-12.5 MG tablet Take 1 tablet by mouth daily. 01/13/23   [name], MD  MAGNESIUM PO Take 1 tablet by mouth daily.    [provider]  metoCLOPramide (REGLAN) 10 MG tablet Take 1 tablet (10 mg total) by mouth 4 (four) times daily -  before meals and at bedtime. 07/20/22   Pickenpack-Cousar, Arty Baumgartner, NP  metoprolol tartrate (LOPRESSOR) 25 MG tablet Take 1 tablet (25 mg total) by mouth once for 1 dose. Please take this medication 2 hours before CT 01/05/23 01/05/23  Baldo Daub, MD  morphine (MS CONTIN) 15 MG 12 hr tablet Take 1 tablet (15 mg total) by mouth every 12 (twelve) hours. 05/12/23   Pickenpack-Cousar, Arty Baumgartner, NP  Multiple Vitamins-Minerals (CENTRUM SILVER PO) Take 1 tablet by mouth daily.    [provider]  Multiple Vitamins-Minerals (ZINC PO) Take 1 tablet by mouth daily.    [provider]  Naphazoline-Pheniramine (OPCON-A) 0.027-0.315 % SOLN Place 1 drop into both eyes daily as needed (redness).    [provider]  nitroGLYCERIN (NITROSTAT) 0.4 MG SL tablet Place 1 tablet (0.4 mg total) under the tongue every 5 (five) minutes as needed for chest pain. 10/05/22   Baldo Daub, MD  omeprazole (PRILOSEC) 20 MG capsule Take 1 capsule by mouth once daily 05/17/23   [name], MD  oxyCODONE (OXY IR/ROXICODONE) 5 MG immediate release tablet Take 1 tablet (5 mg total) by mouth every 4 (four) hours as needed for severe pain (pain score 7-10). 05/10/23   Pickenpack-Cousar, Arty Baumgartner, NP  potassium chloride SA (KLOR-CON M) 20 MEQ tablet Take 1 tablet (20 mEq total) by mouth daily. 12/22/22   Heilingoetter, Cassandra L, PA-C  prochlorperazine (COMPAZINE) 10 MG tablet Take 1 tablet (10 mg total) by mouth every 6 (six) hours as needed for nausea or vomiting. 06/09/22   Si Gaul, MD  rosuvastatin (CRESTOR) 5 MG tablet TAKE 1 TABLET BY MOUTH AT BEDTIME 04/30/23    [name], MD  triamcinolone 0.1%-Eucerin equivalent 1:1 cream mixture Apply topically 3 (three) times daily as needed. 04/06/23   Pickenpack-Cousar, Arty Baumgartner, NP  TURMERIC PO Take 1 tablet by mouth 3 (three) times a week.    [provider]  umeclidinium-vilanterol (ANORO ELLIPTA) 62.5-25 MCG/ACT AEPB Inhale 1 puff into the lungs daily. 09/16/22   [name], MD   Social History   Socioeconomic History   Marital status: Married    Spouse name: [name]   Number of children: Not on file   Years of education: Not on file   Highest education level: Some college, no degree  Occupational History   Occupation: Recruitment consultant  Tobacco Use   Smoking status: Former    Current packs/day: 0.00    Average packs/day: 1 pack/day for 30.0 years (30.0 ttl pk-yrs)    Types: Cigarettes    Start date: 12/20/1981    Quit date: 12/21/2011    Years since quitting: 11.4   Smokeless tobacco: Never  Vaping Use   Vaping status: Every Day  Substance and Sexual Activity   Alcohol use: Yes    Comment: Ocassionaly.    Drug use: Yes    Frequency: 1.0 times per week    Types: Marijuana   Sexual activity: Yes    Birth control/protection: None  Other Topics Concern   Not on file  Social History Narrative   Married. Education: [employer]. Exercise:  "Somewhat".   Social Drivers of Corporate investment banker Strain: Low Risk  (02/16/2023)   Overall Financial Resource Strain (CARDIA)    Difficulty of Paying Living Expenses: Not hard at all  Food Insecurity: No Food Insecurity (02/16/2023)   Hunger Vital Sign    Worried About Running Out of Food in the Last Year: Never true    Ran Out of Food in the Last Year: Never true  Transportation Needs: No Transportation Needs (02/16/2023)   PRAPARE - Administrator, Civil Service (Medical): No    Lack of Transportation (Non-Medical): No  Physical Activity: Inactive (02/16/2023)   Exercise Vital Sign    Days of Exercise per Week: 0  days    Minutes of Exercise per Session: 0 min  Stress: No Stress Concern Present (02/16/2023)   [employer] of Occupational Health - Occupational Stress Questionnaire  Feeling of Stress : Only a little  Social Connections: Socially Integrated (02/16/2023)   Social Connection and Isolation Panel [NHANES]    Frequency of Communication with Friends and Family: Three times a week    Frequency of Social Gatherings with Friends and Family: Once a week    Attends Religious Services: More than 4 times per year    Active Member of [employer] or Organizations: Yes    Attends Engineer, structural: More than 4 times per year    Marital Status: Married  Catering manager Violence: Not At Risk (11/12/2022)   Humiliation, Afraid, Rape, and Kick questionnaire    Fear of Current or Ex-Partner: No    Emotionally Abused: No    Physically Abused: No    Sexually Abused: No    Review of Systems  Per HPI.  Objective:   Vitals:   05/19/23 0940  BP: 122/68  Pulse: 87  Temp: 97.8 F (36.6 C)  TempSrc: Temporal  SpO2: 97%  Weight: 133 lb 12.8 oz (60.7 kg)  Height: 5\' 9"  (1.753 m)     Physical Exam Vitals reviewed.  Constitutional:      Appearance: He is well-developed.  HENT:     Head: Normocephalic and atraumatic.  Neck:     Vascular: No carotid bruit or JVD.  Cardiovascular:     Rate and Rhythm: Normal rate and regular rhythm.     Heart sounds: Normal heart sounds. No murmur heard. Pulmonary:     Effort: Pulmonary effort is normal.     Breath sounds: Normal breath sounds. No rales.  Abdominal:     General: Abdomen is flat. There is no distension.     Tenderness: There is no abdominal tenderness.  Musculoskeletal:     Right lower leg: No edema.     Left lower leg: No edema.  Skin:    General: Skin is warm and dry.  Neurological:     Mental Status: He is alert and oriented to person, place, and time.  Psychiatric:        Mood and Affect: Mood normal.     Assessment &  Plan:  Albert Hardy. is a 68 y.o. male . Hyperglycemia - Plan: Hemoglobin A1c  Hyperlipidemia, unspecified hyperlipidemia type - Plan: rosuvastatin (CRESTOR) 5 MG tablet, Lipid panel  Squamous cell carcinoma of lung, stage IV, left (HCC) - Plan: albuterol (VENTOLIN HFA) 108 (90 Base) MCG/ACT inhaler  Panlobular emphysema (HCC) - Plan: albuterol (VENTOLIN HFA) 108 (90 Base) MCG/ACT inhaler  Urinary hesitancy - Plan: PSA   Meds ordered this encounter  Medications   rosuvastatin (CRESTOR) 5 MG tablet    Sig: Take 1 tablet (5 mg total) by mouth at bedtime.    Dispense:  90 tablet    Refill:  1   albuterol (VENTOLIN HFA) 108 (90 Base) MCG/ACT inhaler    Sig: Inhale 2 puffs into the lungs every 6 (six) hours as needed for wheezing or shortness of breath.    Dispense:  8 g    Refill:  6   Patient Instructions  I will check PSA, and consider meeting with urology to discuss urinary symptoms and options on meds, but would like to see PSA test as well.   No med changes for now. If any lab concerns I will let you know,  Thanks for coming in today and take care.     Signed,   Meredith Staggers, MD [facility], [facility] 05/19/23 10:24  AM

## 2023-05-20 ENCOUNTER — Other Ambulatory Visit: Payer: Medicare Other

## 2023-05-20 ENCOUNTER — Ambulatory Visit: Payer: Medicare Other | Attending: Cardiology

## 2023-05-20 DIAGNOSIS — I340 Nonrheumatic mitral (valve) insufficiency: Secondary | ICD-10-CM | POA: Diagnosis not present

## 2023-05-21 ENCOUNTER — Encounter: Payer: Self-pay | Admitting: Family Medicine

## 2023-05-21 LAB — ECHOCARDIOGRAM COMPLETE
AR max vel: 2.47 cm2
AV Area VTI: 2.59 cm2
AV Area mean vel: 2.45 cm2
AV Mean grad: 8.8 mmHg
AV Peak grad: 17.5 mmHg
AV Vena cont: 0.2 cm
Ao pk vel: 2.09 m/s
Area-P 1/2: 4.06 cm2
P 1/2 time: 632 ms
S' Lateral: 3.1 cm

## 2023-06-08 ENCOUNTER — Inpatient Hospital Stay: Payer: Medicare Other

## 2023-06-08 ENCOUNTER — Inpatient Hospital Stay (HOSPITAL_BASED_OUTPATIENT_CLINIC_OR_DEPARTMENT_OTHER): Payer: Medicare Other | Admitting: Internal Medicine

## 2023-06-08 VITALS — BP 102/61 | HR 69 | Temp 97.8°F | Resp 17 | Ht 69.0 in | Wt 134.4 lb

## 2023-06-08 DIAGNOSIS — Z5112 Encounter for antineoplastic immunotherapy: Secondary | ICD-10-CM | POA: Diagnosis not present

## 2023-06-08 DIAGNOSIS — C3492 Malignant neoplasm of unspecified part of left bronchus or lung: Secondary | ICD-10-CM

## 2023-06-08 LAB — CBC WITH DIFFERENTIAL (CANCER CENTER ONLY)
Abs Immature Granulocytes: 0 10*3/uL (ref 0.00–0.07)
Basophils Absolute: 0 10*3/uL (ref 0.0–0.1)
Basophils Relative: 0 %
Eosinophils Absolute: 0 10*3/uL (ref 0.0–0.5)
Eosinophils Relative: 1 %
HCT: 34.4 % — ABNORMAL LOW (ref 39.0–52.0)
Hemoglobin: 11.3 g/dL — ABNORMAL LOW (ref 13.0–17.0)
Immature Granulocytes: 0 %
Lymphocytes Relative: 20 %
Lymphs Abs: 0.8 10*3/uL (ref 0.7–4.0)
MCH: 29.8 pg (ref 26.0–34.0)
MCHC: 32.8 g/dL (ref 30.0–36.0)
MCV: 90.8 fL (ref 80.0–100.0)
Monocytes Absolute: 0.4 10*3/uL (ref 0.1–1.0)
Monocytes Relative: 10 %
Neutro Abs: 2.9 10*3/uL (ref 1.7–7.7)
Neutrophils Relative %: 69 %
Platelet Count: 204 10*3/uL (ref 150–400)
RBC: 3.79 MIL/uL — ABNORMAL LOW (ref 4.22–5.81)
RDW: 13.5 % (ref 11.5–15.5)
WBC Count: 4.1 10*3/uL (ref 4.0–10.5)
nRBC: 0 % (ref 0.0–0.2)

## 2023-06-08 LAB — CMP (CANCER CENTER ONLY)
ALT: 26 U/L (ref 0–44)
AST: 22 U/L (ref 15–41)
Albumin: 4.2 g/dL (ref 3.5–5.0)
Alkaline Phosphatase: 49 U/L (ref 38–126)
Anion gap: 9 (ref 5–15)
BUN: 18 mg/dL (ref 8–23)
CO2: 27 mmol/L (ref 22–32)
Calcium: 9.1 mg/dL (ref 8.9–10.3)
Chloride: 103 mmol/L (ref 98–111)
Creatinine: 1.06 mg/dL (ref 0.61–1.24)
GFR, Estimated: >60 mL/min (ref >60)
Glucose, Bld: 114 mg/dL — ABNORMAL HIGH (ref 70–99)
Potassium: 3.7 mmol/L (ref 3.5–5.1)
Sodium: 139 mmol/L (ref 135–145)
Total Bilirubin: 0.3 mg/dL (ref 0.0–1.2)
Total Protein: 7.3 g/dL (ref 6.5–8.1)

## 2023-06-08 LAB — TSH: TSH: 1.27 u[IU]/mL (ref 0.350–4.500)

## 2023-06-08 MED ORDER — SODIUM CHLORIDE 0.9 % IV SOLN
350.0000 mg | Freq: Once | INTRAVENOUS | Status: AC
  Administered 2023-06-08: 350 mg via INTRAVENOUS
  Filled 2023-06-08: qty 7

## 2023-06-08 MED ORDER — DENOSUMAB 120 MG/1.7ML ~~LOC~~ SOLN
120.0000 mg | Freq: Once | SUBCUTANEOUS | Status: AC
  Administered 2023-06-08: 120 mg via SUBCUTANEOUS

## 2023-06-08 MED ORDER — SODIUM CHLORIDE 0.9 % IV SOLN: Freq: Once | INTRAVENOUS | Status: AC

## 2023-06-08 NOTE — Patient Instructions (Signed)
 [facility] - A DEPT OF [facility]  Discharge Instructions: Thank you for choosing [facility] to provide your oncology and hematology care.   If you have a lab appointment with the Cancer Center, please go directly to the Cancer Center and check in at the registration area.   Wear comfortable clothing and clothing appropriate for easy access to any Portacath or PICC line.   We strive to give you quality time with your provider. You may need to reschedule your appointment if you arrive late (15 or more minutes).  Arriving late affects you and other patients whose appointments are after yours.  Also, if you miss three or more appointments without notifying the office, you may be dismissed from the clinic at the provider's discretion.      For prescription refill requests, have your pharmacy contact our office and allow 72 hours for refills to be completed.    Today you received the following chemotherapy and/or immunotherapy agents: Libtayo.       To help prevent nausea and vomiting after your treatment, we encourage you to take your nausea medication as directed.  BELOW ARE SYMPTOMS THAT SHOULD BE REPORTED IMMEDIATELY: *FEVER GREATER THAN 100.4 F (38 C) OR HIGHER *CHILLS OR SWEATING *NAUSEA AND VOMITING THAT IS NOT CONTROLLED WITH YOUR NAUSEA MEDICATION *UNUSUAL SHORTNESS OF BREATH *UNUSUAL BRUISING OR BLEEDING *URINARY PROBLEMS (pain or burning when urinating, or frequent urination) *BOWEL PROBLEMS (unusual diarrhea, constipation, pain near the anus) TENDERNESS IN MOUTH AND THROAT WITH OR WITHOUT PRESENCE OF ULCERS (sore throat, sores in mouth, or a toothache) UNUSUAL RASH, SWELLING OR PAIN  UNUSUAL VAGINAL DISCHARGE OR ITCHING   Items with * indicate a potential emergency and should be followed up as soon as possible or go to the Emergency Department if any problems should occur.  Please show the CHEMOTHERAPY ALERT CARD or IMMUNOTHERAPY  ALERT CARD at check-in to the Emergency Department and triage nurse.  Should you have questions after your visit or need to cancel or reschedule your appointment, please contact [facility] - A DEPT OF [facility]  Dept: [phone]  and follow the prompts.  Office hours are 8:00 a.m. to 4:30 p.m. Monday - Friday. Please note that voicemails left after 4:00 p.m. may not be returned until the following business day.  We are closed weekends and major holidays. You have access to a nurse at all times for urgent questions. Please call the main number to the clinic Dept: [phone] and follow the prompts.   For any non-urgent questions, you may also contact your provider using MyChart. We now offer e-Visits for anyone 71 and older to request care online for non-urgent symptoms. For details visit [email or web address].   Also download the MyChart app! Go to the app store, search "MyChart", open the app, select [facility], and log in with your MyChart username and password.

## 2023-06-08 NOTE — Progress Notes (Signed)
 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   Molecular studies by [id number] showed no actionable mutations and PD-L1 expression of 70%.  PRIOR THERAPY:  1) Palliative radiotherapy to the T8 and L4 metastatic bone disease. 2) Systemic chemotherapy with carboplatin  for AUC of 5, paclitaxel  175 Mg/M2 and Libtayo  (Cempilimab) 350 Mg IV every 3 weeks with Neulasta  support for 4 cycles    CURRENT THERAPY: Maintenance treatment with single agent Libtayo  (Cempilimab) 350 Mg IV every 3 weeks.  First dose September 08, 2022.  Status post 13 cycles.  INTERVAL HISTORY: Albert Hardy. 68 y.o. male returns to the clinic today for follow-up visit. Discussed the use of AI scribe software for clinical note transcription with the patient, who gave verbal consent to proceed.  History of Present Illness   Albert Hardy. is a 68 year old male with stage four non-small cell lung cancer who presents for evaluation before starting cycle number fourteen of maintenance treatment.  He has stage four non-small cell lung cancer, specifically squamous cell carcinoma with PD-L1 expression of seventy percent. He is currently on maintenance treatment with Libtayo , administered every three weeks. He has completed thirteen cycles and is here for evaluation before starting the fourteenth cycle.  He reports no changes since his last visit and states that 'everything's good.' He mentions not exercising as much as he could but is trying to increase his activity level.  He experiences mild anemia,  with a hemoglobin level of 11.3, which is attributed to his chemotherapy and underlying condition. He reports feeling cold, which is associated with his anemia. Otherwise, no major issues and his blood work is otherwise stable.         MEDICAL HISTORY: Past Medical History:  Diagnosis Date   Aortic regurgitation    Aortic stenosis 04/04/2018   Atherosclerosis of native arteries of the extremities with intermittent claudication 09/08/2013   Bilateral impacted cerumen 10/13/2018   Bilateral sensorineural hearing loss 12/14/2018   Cancer (HCC)    lung   Dyspnea    Former moderate cigarette smoker (10-19 per day) 05/20/2012   Quit smoking Nov 2013 when hospitalized w/ HTN and chest pain(diagnosed w/ valvular heart disease)   GERD (gastroesophageal reflux disease)    Heart murmur    History of radiation therapy    Lumbar Spine, Thoracic Spine- 06/25/22-07/10/22- Dr. 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  MEDICATIONS:  Current Outpatient Medications  Medication Sig Dispense Refill   albuterol  (VENTOLIN  HFA) 108 (90 Base) MCG/ACT inhaler Inhale 2 puffs into the lungs every 6 (six) hours as needed for wheezing or shortness of breath. 8 g 6   apixaban  (ELIQUIS ) 5 MG TABS tablet Take 1 tablet (5 mg total) by mouth 2 (two) times daily. 60 tablet 3  Ascorbic Acid (VITAMIN C PO) Take 1 tablet by mouth daily.     Cholecalciferol (VITAMIN D3) 1000 UNITS CAPS Take 1,000 Units by mouth daily.     Coenzyme Q10 (CO Q 10 PO) Take 1 tablet by mouth daily.     Cyanocobalamin (VITAMIN B-12 PO) Take 1 tablet by mouth daily.     cyclobenzaprine (FLEXERIL) 5 MG tablet Take 1 tablet (5 mg total) by mouth 3 (three) times daily as needed for muscle  spasms. 30 tablet 0   ezetimibe  (ZETIA ) 10 MG tablet Take 1 tablet (10 mg total) by mouth daily. 90 tablet 3   fluticasone  (FLONASE ) 50 MCG/ACT nasal spray Place 1-2 sprays into both nostrils daily. 16 g 6   KRILL OIL PO Take 1 tablet by mouth daily.     levofloxacin  (LEVAQUIN ) 500 MG tablet Take 1 tablet (500 mg total) by mouth daily. 7 tablet 0   lisinopril -hydrochlorothiazide  (ZESTORETIC ) 10-12.5 MG tablet Take 1 tablet by mouth daily. 90 tablet 1   MAGNESIUM PO Take 1 tablet by mouth daily.     metoCLOPramide  (REGLAN ) 10 MG tablet Take 1 tablet (10 mg total) by mouth 4 (four) times daily -  before meals and at bedtime. 56 tablet 0   metoprolol  tartrate (LOPRESSOR ) 25 MG tablet Take 1 tablet (25 mg total) by mouth once for 1 dose. Please take this medication 2 hours before CT 1 tablet 0   morphine  (MS CONTIN ) 15 MG 12 hr tablet Take 1 tablet (15 mg total) by mouth every 12 (twelve) hours. 50 tablet 0   Multiple Vitamins-Minerals (CENTRUM SILVER PO) Take 1 tablet by mouth daily.     Multiple Vitamins-Minerals (ZINC PO) Take 1 tablet by mouth daily.     Naphazoline-Pheniramine (OPCON-A) 0.027-0.315 % SOLN Place 1 drop into both eyes daily as needed (redness).     nitroGLYCERIN  (NITROSTAT ) 0.4 MG SL tablet Place 1 tablet (0.4 mg total) under the tongue every 5 (five) minutes as needed for chest pain. 25 tablet 1   omeprazole  (PRILOSEC) 20 MG capsule Take 1 capsule by mouth once daily 30 capsule 0   oxyCODONE  (OXY IR/ROXICODONE ) 5 MG immediate release tablet Take 1 tablet (5 mg total) by mouth every 4 (four) hours as needed for severe pain (pain score 7-10). 90 tablet 0   potassium chloride  SA (KLOR-CON  M) 20 MEQ tablet Take 1 tablet (20 mEq total) by mouth daily. 6 tablet 0   prochlorperazine  (COMPAZINE ) 10 MG tablet Take 1 tablet (10 mg total) by mouth every 6 (six) hours as needed for nausea or vomiting. 30 tablet 0   rosuvastatin  (CRESTOR ) 5 MG tablet Take 1 tablet (5 mg total) by mouth at  bedtime. 90 tablet 1   triamcinolone  0.1%-Eucerin equivalent 1:1 cream mixture Apply topically 3 (three) times daily as needed. 480 g 2   TURMERIC PO Take 1 tablet by mouth 3 (three) times a week.     umeclidinium-vilanterol (ANORO ELLIPTA ) 62.5-25 MCG/ACT AEPB Inhale 1 puff into the lungs daily. 180 each 3   No current facility-administered medications for this visit.    SURGICAL HISTORY:  Past Surgical History:  Procedure Laterality Date   BRONCHIAL BIOPSY  05/26/2022   Procedure: BRONCHIAL BIOPSIES;  Surgeon: Prudy Brownie, DO;  Location: [facility];  Service: Pulmonary;;   BRONCHIAL BRUSHINGS  05/26/2022   Procedure: BRONCHIAL BRUSHINGS;  Surgeon: Prudy Brownie, DO;  Location: [facility];  Service: Pulmonary;;   NO PAST SURGERIES     VIDEO  BRONCHOSCOPY  05/26/2022   Procedure: VIDEO BRONCHOSCOPY WITHOUT FLUORO;  Surgeon: Prudy Brownie, DO;  Location: [facility];  Service: Pulmonary;;    REVIEW OF SYSTEMS:  A comprehensive review of systems was negative except for: Constitutional: positive for fatigue   PHYSICAL EXAMINATION: General appearance: alert, cooperative, and no distress Head: Normocephalic, without obvious abnormality, atraumatic Neck: no adenopathy, no JVD, supple, symmetrical, trachea midline, and thyroid  not enlarged, symmetric, no tenderness/mass/nodules Lymph nodes: Cervical, supraclavicular, and axillary nodes normal. Resp: clear to auscultation bilaterally Back: symmetric, no curvature. ROM normal. No CVA tenderness. Cardio: regular rate and rhythm, S1, S2 normal, no murmur, click, rub or gallop GI: soft, non-tender; bowel sounds normal; no masses,  no organomegaly Extremities: extremities normal, atraumatic, no cyanosis or edema  ECOG PERFORMANCE STATUS: 1 - Symptomatic but completely ambulatory  Blood pressure 102/61, pulse 69, temperature 97.8 F (36.6 C), temperature source Temporal, resp. rate 17, height 5\' 9"  (1.753 m), weight 134 lb 6.4 oz (61  kg), SpO2 100%.  LABORATORY DATA: Lab Results  Component Value Date   WBC 4.1 06/08/2023   HGB 11.3 (L) 06/08/2023   HCT 34.4 (L) 06/08/2023   MCV 90.8 06/08/2023   PLT 204 06/08/2023      Chemistry      Component Value Date/Time   NA 136 05/18/2023 1002   NA 146 (H) 01/05/2023 1114   K 3.9 05/18/2023 1002   CL 103 05/18/2023 1002   CO2 24 05/18/2023 1002   BUN 23 05/18/2023 1002   BUN 11 01/05/2023 1114   CREATININE 1.16 05/18/2023 1002   CREATININE 1.07 08/27/2014 1511      Component Value Date/Time   CALCIUM  9.2 05/18/2023 1002   ALKPHOS 53 05/18/2023 1002   AST 24 05/18/2023 1002   ALT 28 05/18/2023 1002   BILITOT 0.3 05/18/2023 1002       RADIOGRAPHIC STUDIES: ECHOCARDIOGRAM COMPLETE Result Date: 05/21/2023    ECHOCARDIOGRAM REPORT   Patient Name:   Dillin Bas. Date of Exam: 05/20/2023 Medical Rec #:  [id number]       Height:       69.0 in Accession #:    [id number]      Weight:       133.8 lb Date of Birth:  December 31, 1955       BSA:          1.742 m Patient Age:    67 years        BP:           122/68 mmHg Patient Gender: M               HR:           59 bpm. Exam Location:  [place] Procedure: 2D Echo, Cardiac Doppler, Color Doppler, Strain Analysis and 3D Echo            (Both Spectral and Color Flow Doppler were utilized during            procedure). Indications:    Moderate to severe mitral regurgitation [I34.0]  History:        Patient has prior history of Echocardiogram examinations, most                 recent 11/11/2022. Aortic Valve Disease, Arrythmias:PVC,                 Signs/Symptoms:Chest Pain and Murmur; Risk Factors:Hypertension,  Former Smoker and Dyslipidemia. Nonrheumatic aortic valve                 insufficiency.  Sonographer:    Erminia Hazel RDCS Referring Phys: [id number] BRIAN J MUNLEY IMPRESSIONS  1. Left ventricular ejection fraction, by estimation, is 60 to 65%. The left ventricle has normal function. The left ventricle has no  regional wall motion abnormalities. Left ventricular diastolic parameters were normal. The average left ventricular global longitudinal strain is -21.7 %. The global longitudinal strain is normal.  2. Right ventricular systolic function is normal. The right ventricular size is normal.  3. The mitral valve is grossly normal. Trivial mitral valve regurgitation. No evidence of mitral stenosis.  4. The aortic valve is tricuspid. There is mild calcification of the aortic valve. There is mild thickening of the aortic valve. Aortic valve regurgitation is mild to moderate. Aortic valve sclerosis/calcification is present, without any evidence of aortic stenosis. Aortic valve area, by VTI measures 2.59 cm. Aortic valve mean gradient measures 8.8 mmHg. Aortic valve Vmax measures 2.09 m/s.  5. The inferior vena cava is normal in size with greater than 50% respiratory variability, suggesting right atrial pressure of 3 mmHg. Comparison(s): In comparison prior echocardiogram from 03-13-2022 reported LVEF 60 to 65% and aortic insufficiency appears moderate to severe. Mild MR. Small pericardial effusion. FINDINGS  Left Ventricle: Left ventricular ejection fraction, by estimation, is 60 to 65%. The left ventricle has normal function. The left ventricle has no regional wall motion abnormalities. The average left ventricular global longitudinal strain is -21.7 %. Strain was performed and the global longitudinal strain is normal. The left ventricular internal cavity size was normal in size. There is no left ventricular hypertrophy. Left ventricular diastolic parameters were normal. Right Ventricle: The right ventricular size is normal. Right vetricular wall thickness was not well visualized. Right ventricular systolic function is normal. Left Atrium: Left atrial size was normal in size. Right Atrium: Right atrial size was normal in size. Pericardium: Trivial pericardial effusion is present. Mitral Valve: The mitral valve is grossly  normal. Trivial mitral valve regurgitation. No evidence of mitral valve stenosis. Tricuspid Valve: The tricuspid valve is grossly normal. Tricuspid valve regurgitation is trivial. No evidence of tricuspid stenosis. Aortic Valve: The aortic valve is tricuspid. There is mild calcification of the aortic valve. There is mild thickening of the aortic valve. Aortic valve regurgitation is mild to moderate. Aortic regurgitation PHT measures 632 msec. Aortic valve sclerosis/calcification is present, without any evidence of aortic stenosis. Aortic valve mean gradient measures 8.8 mmHg. Aortic valve peak gradient measures 17.5 mmHg. Aortic valve area, by VTI measures 2.59 cm. Pulmonic Valve: The pulmonic valve was grossly normal. Pulmonic valve regurgitation is not visualized. No evidence of pulmonic stenosis. Aorta: The aortic root is normal in size and structure and the ascending aorta was not well visualized. Venous: The inferior vena cava is normal in size with greater than 50% respiratory variability, suggesting right atrial pressure of 3 mmHg. IAS/Shunts: The interatrial septum was not well visualized.  LEFT VENTRICLE PLAX 2D LVIDd:         4.80 cm   Diastology LVIDs:         3.10 cm   LV e' medial:    7.78 cm/s LV PW:         0.90 cm   LV E/e' medial:  12.6 LV IVS:        0.90 cm   LV e' lateral:   9.82 cm/s LVOT diam:  2.00 cm   LV E/e' lateral: 10.0 LV SV:         107 LV SV Index:   62        2D Longitudinal Strain LVOT Area:     3.14 cm  2D Strain GLS Avg:     -21.7 %                           3D Volume EF:                          3D EF:        60 %                          LV EDV:       96 ml                          LV ESV:       38 ml                          LV SV:        58 ml RIGHT VENTRICLE             IVC RV Basal diam:  2.90 cm     IVC diam: 1.50 cm RV Mid diam:    2.10 cm RV S prime:     11.70 cm/s TAPSE (M-mode): 2.7 cm LEFT ATRIUM             Index        RIGHT ATRIUM           Index LA diam:         3.00 cm 1.72 cm/m   RA Area:     10.60 cm LA Vol (A2C):   33.5 ml 19.24 ml/m  RA Volume:   18.10 ml  10.39 ml/m LA Vol (A4C):   27.4 ml 15.73 ml/m LA Biplane Vol: 30.4 ml 17.46 ml/m  AORTIC VALVE AV Area (Vmax):    2.47 cm AV Area (Vmean):   2.45 cm AV Area (VTI):     2.59 cm AV Vmax:           209.20 cm/s AV Vmean:          133.400 cm/s AV VTI:            0.414 m AV Peak Grad:      17.5 mmHg AV Mean Grad:      8.8 mmHg LVOT Vmax:         164.67 cm/s LVOT Vmean:        103.833 cm/s LVOT VTI:          0.341 m LVOT/AV VTI ratio: 0.82 AI PHT:            632 msec AR Vena Contracta: 0.20 cm  AORTA Ao Root diam: 3.50 cm Ao Asc diam:  3.20 cm Ao Desc diam: 2.40 cm MITRAL VALVE MV Area (PHT): 4.06 cm     SHUNTS MV Decel Time: 187 msec     Systemic VTI:  0.34 m MV E velocity: 97.77 cm/s   Systemic Diam: 2.00 cm MV A velocity: 111.67 cm/s MV E/A ratio:  0.88 Sreedhar reddy Madireddy Electronically signed by Tereasa Felty reddy Madireddy Signature Date/Time: 05/21/2023/12:29:11 PM    Final  CT CHEST ABDOMEN PELVIS W CONTRAST Result Date: 05/12/2023 CLINICAL DATA:  Metastatic disease evaluation, squamous cell carcinoma of the lung. * Tracking Code: BO * EXAM: CT CHEST, ABDOMEN, AND PELVIS WITH CONTRAST TECHNIQUE: Multidetector CT imaging of the chest, abdomen and pelvis was performed following the standard protocol during bolus administration of intravenous contrast. RADIATION DOSE REDUCTION: This exam was performed according to the departmental dose-optimization program which includes automated exposure control, adjustment of the mA and/or kV according to patient size and/or use of iterative reconstruction technique. CONTRAST:  OMNIPAQUE  IOHEXOL  300 MG/ML  SOLN COMPARISON:  Multiple priors including CT March 09, 2023 FINDINGS: CT CHEST FINDINGS Cardiovascular: Aortic atherosclerosis. Normal size heart. No significant pericardial effusion/thickening. Mediastinum/Nodes: Stable left hilar lymph nodes with the  previously and Litt index lymph node measuring 2.2 x 1.8 cm on image 32/2, unchanged. H No progressive thoracic adenopathy. Lungs/Pleura: Left hilar mass again causes central obstruction of the left upper lobe bronchus with postobstructive bronchial wall plugging. Numerous small ground-glass nodule seen throughout the left upper lobe are slightly increased from prior examination Musculoskeletal: Similar appearance of the mixed lytic and sclerotic osseous metastasis at T8. No new suspicious lytic or blastic lesion of bone. CT ABDOMEN PELVIS FINDINGS Hepatobiliary: No suspicious hepatic lesion. Gallbladder is unremarkable. No biliary ductal dilation. Pancreas: No pancreatic ductal dilation or evidence of acute inflammation. Spleen: No splenomegaly or focal splenic lesion. Adrenals/Urinary Tract: Bilateral adrenal glands appear normal. No hydronephrosis. Urinary bladder is minimally distended limiting evaluation. Stomach/Bowel: Stomach is unremarkable for degree of distension. No pathologic dilation of small or large bowel. No evidence of acute bowel inflammation. Vascular/Lymphatic: Aortic atherosclerosis. No pathologically enlarged abdominal or pelvic lymph nodes. Reproductive: Heterogeneous enhancement of prostate gland. Other: No significant abdominopelvic free fluid. Musculoskeletal: Similar appearance of the mixed lytic and sclerotic L4 vertebral body osseous metastasis. IMPRESSION: 1. Stable left hilar lymph nodes with central obstruction of the left upper lobe bronchus by the left hilar mass and progressive postobstructive bronchial plugging. 2. Numerous small ground-glass nodule seen throughout the left upper lobe are slightly increased from prior examination, nonspecific suggest continued attention on short-term interval follow-up imaging. 3. Similar appearance of the mixed lytic and sclerotic osseous metastasis at T8 and L4. 4. Heterogeneous enhancement of the prostate gland, nonspecific and may reflect  prostatitis. Suggest correlation with PSA. 5. Aortic atherosclerosis. Electronically Signed   By: Tama Fails M.D.   On: 05/12/2023 15:55    ASSESSMENT AND PLAN: This is a very pleasant 68 years old African-American male with Stage IV (T2a, N2, M1b) non-small cell lung cancer, squamous cell carcinoma presented with left upper lobe lung mass in addition to AP window lymphadenopathy and suspicious bone metastasis to the T8 and L4 vertebrae as well as right retroperitoneal metastatic nodule diagnosed in April 2024.  Molecular studies by [id number] showed no actionable mutations and PD-L1 expression of 70%. The patient underwent palliative combination of systemic chemotherapy with carboplatin  for AUC of 5, paclitaxel  175 Mg/M2 and immunotherapy with Libtayo  (Cempilimab) 350 Mg IV every 3 weeks with Neulasta  support for 4 cycles followed by maintenance treatment with Libtayo  (Cempilimab) of the patient has no disease progression after cycle #4.  He is status post 4 cycles. He is currently undergoing maintenance treatment with single agent Libtayo  (Cempilimab) 350 Mg IV every 3 weeks status post 12 cycles.   For the back pain he underwent palliative radiotherapy to the bone disease at T8 and L4 vertebral lesions. For the bone metastasis, he is currently  on treatment with Xgeva  120 mg subcutaneously every 6 weeks. Stage 4 non-small cell lung cancer Stage 4 non-small cell lung cancer, squamous cell carcinoma with PD-L1 expression of 70%. Currently on maintenance treatment with single-agent Libtayo  every three weeks. No major issues reported, and he remains well-managed. Blood work is satisfactory for continuation of treatment. - Proceed with cycle fourteen of Libtayo  today. - Schedule next evaluation and treatment in three weeks.  Mild anemia Mild anemia with hemoglobin level at 11.3, likely related to chemotherapy and underlying condition. Contributes to feeling cold. Dietary modifications suggested to  improve iron intake. - Encourage consumption of iron-rich foods such as steak and red protein.   The patient was advised to call immediately if he has any other concerning symptoms in the interval. The patient voices understanding of current disease status and treatment options and is in agreement with the current care plan.  All questions were answered. The patient knows to call the clinic with any problems, questions or concerns. We can certainly see the patient much sooner if necessary.  The total time spent in the appointment was 20 minutes.  Disclaimer: This note was dictated with voice recognition software. Similar sounding words can inadvertently be transcribed and may not be corrected upon review.

## 2023-06-09 LAB — T4: T4, Total: 8.1 ug/dL (ref 4.5–12.0)

## 2023-06-10 ENCOUNTER — Other Ambulatory Visit: Payer: Self-pay | Admitting: Physician Assistant

## 2023-06-10 DIAGNOSIS — I2699 Other pulmonary embolism without acute cor pulmonale: Secondary | ICD-10-CM

## 2023-06-11 ENCOUNTER — Other Ambulatory Visit: Payer: Self-pay | Admitting: Family Medicine

## 2023-06-11 DIAGNOSIS — K219 Gastro-esophageal reflux disease without esophagitis: Secondary | ICD-10-CM

## 2023-06-14 ENCOUNTER — Other Ambulatory Visit: Payer: Self-pay | Admitting: Nurse Practitioner

## 2023-06-14 ENCOUNTER — Other Ambulatory Visit: Payer: Self-pay

## 2023-06-14 DIAGNOSIS — Z515 Encounter for palliative care: Secondary | ICD-10-CM

## 2023-06-14 DIAGNOSIS — G893 Neoplasm related pain (acute) (chronic): Secondary | ICD-10-CM

## 2023-06-14 DIAGNOSIS — C3492 Malignant neoplasm of unspecified part of left bronchus or lung: Secondary | ICD-10-CM

## 2023-06-14 MED ORDER — MORPHINE SULFATE ER 15 MG PO TBCR
15.0000 mg | EXTENDED_RELEASE_TABLET | Freq: Two times a day (BID) | ORAL | 0 refills | Status: DC
Start: 2023-06-14 — End: 2023-07-15

## 2023-06-14 MED ORDER — MORPHINE SULFATE ER 15 MG PO TBCR
15.0000 mg | EXTENDED_RELEASE_TABLET | Freq: Two times a day (BID) | ORAL | 0 refills | Status: DC
Start: 2023-06-14 — End: 2023-06-14

## 2023-06-14 MED ORDER — OXYCODONE HCL 5 MG PO TABS: 5.0000 mg | ORAL_TABLET | ORAL | 0 refills | Status: DC | PRN

## 2023-06-14 NOTE — Telephone Encounter (Signed)
 Pt pharmacy called reporting that [facility] Contin  was out of stock,routed to an [employer].

## 2023-06-17 ENCOUNTER — Other Ambulatory Visit: Payer: Self-pay | Admitting: Nurse Practitioner

## 2023-06-17 ENCOUNTER — Other Ambulatory Visit: Payer: Self-pay

## 2023-06-17 DIAGNOSIS — Z515 Encounter for palliative care: Secondary | ICD-10-CM

## 2023-06-17 DIAGNOSIS — G893 Neoplasm related pain (acute) (chronic): Secondary | ICD-10-CM

## 2023-06-17 DIAGNOSIS — C3492 Malignant neoplasm of unspecified part of left bronchus or lung: Secondary | ICD-10-CM

## 2023-06-17 MED ORDER — OXYCODONE HCL 5 MG PO TABS
5.0000 mg | ORAL_TABLET | ORAL | 0 refills | Status: DC | PRN
Start: 2023-06-17 — End: 2023-07-15

## 2023-06-23 DIAGNOSIS — R0600 Dyspnea, unspecified: Secondary | ICD-10-CM | POA: Insufficient documentation

## 2023-06-23 DIAGNOSIS — Z923 Personal history of irradiation: Secondary | ICD-10-CM | POA: Insufficient documentation

## 2023-06-23 DIAGNOSIS — C801 Malignant (primary) neoplasm, unspecified: Secondary | ICD-10-CM | POA: Insufficient documentation

## 2023-06-24 ENCOUNTER — Ambulatory Visit: Payer: Medicare Other | Admitting: Cardiology

## 2023-06-25 ENCOUNTER — Ambulatory Visit

## 2023-06-25 VITALS — BP 98/58 | HR 88 | Ht 69.0 in | Wt 131.6 lb

## 2023-06-25 DIAGNOSIS — I351 Nonrheumatic aortic (valve) insufficiency: Secondary | ICD-10-CM

## 2023-06-25 DIAGNOSIS — C801 Malignant (primary) neoplasm, unspecified: Secondary | ICD-10-CM

## 2023-06-25 DIAGNOSIS — I10 Essential (primary) hypertension: Secondary | ICD-10-CM

## 2023-06-25 DIAGNOSIS — E782 Mixed hyperlipidemia: Secondary | ICD-10-CM | POA: Diagnosis present

## 2023-06-25 DIAGNOSIS — Z923 Personal history of irradiation: Secondary | ICD-10-CM

## 2023-06-25 NOTE — Patient Instructions (Signed)
 Medication Instructions:  Your physician recommends that you continue on your current medications as directed. Please refer to the Current Medication list given to you today.  *If you need a refill on your cardiac medications before your next appointment, please call your pharmacy*  Lab Work: None If you have labs (blood work) drawn today and your tests are completely normal, you will receive your results only by: MyChart Message (if you have MyChart) OR A paper copy in the mail If you have any lab test that is abnormal or we need to change your treatment, we will call you to review the results.  Testing/Procedures: Your physician has requested that you have an echocardiogram. Echocardiography is a painless test that uses sound waves to create images of your heart. It provides your doctor with information about the size and shape of your heart and how well your heart's chambers and valves are working. This procedure takes approximately one hour. There are no restrictions for this procedure. Please do NOT wear cologne, perfume, aftershave, or lotions (deodorant is allowed). Please arrive 15 minutes prior to your appointment time.  Please note: We ask at that you not bring children with you during ultrasound (echo/ vascular) testing. Due to room size and safety concerns, children are not allowed in the ultrasound rooms during exams. Our front office staff cannot provide observation of children in our lobby area while testing is being conducted. An adult accompanying a patient to their appointment will only be allowed in the ultrasound room at the discretion of the ultrasound technician under special circumstances. We apologize for any inconvenience.   Follow-Up: At [facility], you and your health needs are our priority.  As part of our continuing mission to provide you with exceptional heart care, our providers are all part of one team.  This team includes your primary Cardiologist  (physician) and Advanced Practice Providers or APPs (Physician Assistants and Nurse Practitioners) who all work together to provide you with the care you need, when you need it.  Your next appointment:   1 year(s)  Provider:   Huntley Dec, MD    We recommend signing up for the patient portal called "MyChart".  Sign up information is provided on this After Visit Summary.  MyChart is used to connect with patients for Virtual Visits (Telemedicine).  Patients are able to view lab/test results, encounter notes, upcoming appointments, etc.  Non-urgent messages can be sent to your provider as well.   To learn more about what you can do with MyChart, go to [email or web address].   Other Instructions None

## 2023-06-25 NOTE — Progress Notes (Signed)
 Cardiology Consultation:    Date:  06/25/2023   ID:  Albert Ang., DOB 05-13-1955, MRN [id number]  PCP:  [name], MD  Cardiologist:  Daymon Evans Shereda Graw, MD   Referring MD: [name], MD   No chief complaint on file.    ASSESSMENT AND PLAN:   [name] 68 year old male with history of no significant coronary artery disease on cardiac CT imaging January 2025 [calcium  score 0, CAD RADS 0 study], aortic atherosclerosis noted on cardiac CT imaging January 2025, previously felt to have moderate to severe eccentric aortic insufficiency and mild mitral regurgitation on echocardiogram October 2024 however repeat echocardiogram from April 2025 with normal biventricular function LVEF 60 to 65%, mild to moderate aortic insufficiency, trace mitral regurgitation.  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   Problem List Items Addressed This Visit     Aortic insufficiency - Primary   Mild-moderate aortic insufficiency on echocardiogram from April 2025.  Reviewed with him the findings. Follow-up interval assessment with repeat echocardiogram tentatively in 1 year.      Relevant Orders   ECHOCARDIOGRAM COMPLETE   Hypertension (Chronic)   Well-controlled.  Asymptomatic. Currently on hydrochlorothiazide -lisinopril  12.5 mg / 10 mg once daily. Advised him to monitor blood pressures at home.  If persistently low blood pressures may consider titrating down his medications.       Hyperlipidemia (Chronic)   Lipid panel from June 08, 2023 LDL 98, HDL 62, total cholesterol 409, triglycerides 60. Optimal considering no significant coronary atherosclerosis but does have aortic atherosclerosis on cardiac CT.  Continue with Crestor  5 mg once daily.       Return to clinic  tentatively in 1 year.   History of Present Illness:    Albert Siddall. is a 68 y.o. male who is being seen today for follow-up visit. PCP is [name], MD. Last visit with us  in the office was 01-05-2023 with Dr. Sandee Crook. Pleasant gentleman here for the visit by himself.  Has history of no significant coronary artery disease on cardiac CT imaging January 2025 [calcium  score 0, CAD RADS 0 study], aortic atherosclerosis noted on cardiac CT imaging January 2025, previously felt to have moderate to severe eccentric aortic insufficiency and mild mitral regurgitation on echocardiogram October 2024 however repeat echocardiogram from April 2025 with normal biventricular function LVEF 60 to 65%, mild to moderate aortic insufficiency, trace mitral regurgitation.  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  Mentions overall he has been doing well from cardiac standpoint, denies any chest pain, shortness of breath.  No regular exercise but is planning to get more active. Denies any active blood in urine or stools.  Blood work from 06-08-2023 hemoglobin stable 11.3, hematocrit 34.4, WBC 4.1, platelets 204. CMP unremarkable with BUN 18 and creatinine 1.06, EGFR greater than 60, normal transaminases and alkaline phosphatase. TSH 1.27, normal. Hemoglobin A1c 6.1. Lipid panel total cholesterol 172, HDL 62, LDL 98, triglycerides 60.   Past Medical History:  Diagnosis Date   Aortic regurgitation    Aortic stenosis 04/04/2018   Atherosclerosis of native arteries of the extremities with intermittent claudication 09/08/2013   Atherosclerosis of native artery of extremity with intermittent claudication (HCC)  09/08/2013   IMO SNOMED Dx Update Oct 2024     Bilateral impacted cerumen 10/13/2018   Bilateral  sensorineural hearing loss 12/14/2018   Cancer (HCC)    lung   Dyspnea    Encounter for antineoplastic chemotherapy 08/18/2022   Encounter for antineoplastic immunotherapy 08/18/2022   Former moderate cigarette smoker (10-19 per day) 05/20/2012   Quit smoking Nov 2013 when hospitalized w/ HTN and chest pain(diagnosed w/ valvular heart disease)   GERD (gastroesophageal reflux disease)    Heart murmur    History of radiation therapy    Lumbar Spine, Thoracic Spine- 06/25/22-07/10/22- Dr. 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   Substance abuse (HCC)     Past Surgical History:  Procedure Laterality Date   BRONCHIAL BIOPSY  05/26/2022   Procedure: BRONCHIAL BIOPSIES;  Surgeon: Prudy Brownie, DO;  Location: [facility];  Service: Pulmonary;;   BRONCHIAL BRUSHINGS  05/26/2022   Procedure: BRONCHIAL BRUSHINGS;  Surgeon: Prudy Brownie, DO;  Location: [facility];  Service: Pulmonary;;   NO PAST SURGERIES     VIDEO BRONCHOSCOPY  05/26/2022   Procedure: VIDEO BRONCHOSCOPY WITHOUT FLUORO;  Surgeon: Prudy Brownie, DO;  Location: [facility];  Service: Pulmonary;;    Current Medications: Current Meds  Medication Sig   albuterol  (VENTOLIN  HFA) 108 (90 Base) MCG/ACT inhaler Inhale 2 puffs into the lungs every 6 (six) hours as needed for wheezing or shortness of breath.   Ascorbic Acid (VITAMIN C PO) Take 1 tablet by mouth daily.   Cholecalciferol (VITAMIN D3) 1000 UNITS CAPS Take 1,000 Units by mouth daily.   Coenzyme Q10 (CO Q 10 PO) Take 1 tablet by mouth daily.   Cyanocobalamin (VITAMIN B-12 PO) Take 1 tablet by mouth daily.   cyclobenzaprine (FLEXERIL) 5 MG tablet Take 1 tablet (5 mg total) by mouth 3 (three) times daily as needed for  muscle spasms.   ELIQUIS  5 MG TABS tablet Take 1 tablet by mouth twice daily   ezetimibe  (ZETIA ) 10 MG tablet Take 1 tablet (10 mg total) by mouth daily.   fluticasone  (FLONASE ) 50 MCG/ACT nasal spray Place 1-2 sprays into both nostrils daily.   KRILL OIL PO Take 1 tablet by mouth daily.   levofloxacin  (LEVAQUIN ) 500 MG tablet Take 1 tablet (500 mg total) by mouth daily.   lisinopril -hydrochlorothiazide  (ZESTORETIC ) 10-12.5 MG tablet Take 1 tablet by mouth daily.   MAGNESIUM PO Take 1 tablet by mouth daily.   metoCLOPramide  (REGLAN ) 10 MG tablet Take 1 tablet (10 mg total) by mouth 4 (four) times daily -  before meals and at bedtime.   morphine  (MS CONTIN ) 15 MG 12 hr tablet Take 1 tablet (15 mg total) by mouth every 12 (twelve) hours.   Multiple Vitamins-Minerals (CENTRUM SILVER PO) Take 1 tablet by mouth daily.   Multiple Vitamins-Minerals (ZINC PO) Take 1 tablet by mouth daily.   Naphazoline-Pheniramine (OPCON-A) 0.027-0.315 % SOLN Place 1 drop into both eyes daily as needed (redness).   nitroGLYCERIN  (NITROSTAT ) 0.4 MG SL tablet Place 1 tablet (0.4 mg total) under the tongue every 5 (five) minutes as needed for chest pain.   omeprazole  (PRILOSEC) 20 MG capsule Take 1 capsule by mouth once daily  oxyCODONE  (OXY IR/ROXICODONE ) 5 MG immediate release tablet Take 1 tablet (5 mg total) by mouth every 4 (four) hours as needed for severe pain (pain score 7-10).   potassium chloride  SA (KLOR-CON  M) 20 MEQ tablet Take 1 tablet (20 mEq total) by mouth daily.   prochlorperazine  (COMPAZINE ) 10 MG tablet Take 1 tablet (10 mg total) by mouth every 6 (six) hours as needed for nausea or vomiting.   rosuvastatin  (CRESTOR ) 5 MG tablet Take 1 tablet (5 mg total) by mouth at bedtime.   triamcinolone  0.1%-Eucerin equivalent 1:1 cream mixture Apply topically 3 (three) times daily as needed.   TURMERIC PO Take 1 tablet by mouth 3 (three) times a week.   umeclidinium-vilanterol (ANORO ELLIPTA ) 62.5-25 MCG/ACT AEPB  Inhale 1 puff into the lungs daily.     Allergies:   Lipitor [atorvastatin] and Pravastatin   Social History   Socioeconomic History   Marital status: Married    Spouse name: Albert Hardy   Number of children: Not on file   Years of education: Not on file   Highest education level: Some college, no degree  Occupational History   Occupation: Recruitment consultant  Tobacco Use   Smoking status: Former    Current packs/day: 0.00    Average packs/day: 1 pack/day for 30.0 years (30.0 ttl pk-yrs)    Types: Cigarettes    Start date: 12/20/1981    Quit date: 12/21/2011    Years since quitting: 11.5   Smokeless tobacco: Never  Vaping Use   Vaping status: Every Day  Substance and Sexual Activity   Alcohol use: Yes    Comment: Ocassionaly.    Drug use: Yes    Frequency: 1.0 times per week    Types: Marijuana   Sexual activity: Yes    Birth control/protection: None  Other Topics Concern   Not on file  Social History Narrative   Married. Education: [employer]. Exercise:  "Somewhat".   Social Drivers of Corporate investment banker Strain: Low Risk  (02/16/2023)   Overall Financial Resource Strain (CARDIA)    Difficulty of Paying Living Expenses: Not hard at all  Food Insecurity: No Food Insecurity (02/16/2023)   Hunger Vital Sign    Worried About Running Out of Food in the Last Year: Never true    Ran Out of Food in the Last Year: Never true  Transportation Needs: No Transportation Needs (02/16/2023)   PRAPARE - Administrator, Civil Service (Medical): No    Lack of Transportation (Non-Medical): No  Physical Activity: Inactive (02/16/2023)   Exercise Vital Sign    Days of Exercise per Week: 0 days    Minutes of Exercise per Session: 0 min  Stress: No Stress Concern Present (02/16/2023)   [employer] of Occupational Health - Occupational Stress Questionnaire    Feeling of Stress : Only a little  Social Connections: Socially Integrated (02/16/2023)   Social Connection and  Isolation Panel [NHANES]    Frequency of Communication with Friends and Family: Three times a week    Frequency of Social Gatherings with Friends and Family: Once a week    Attends Religious Services: More than 4 times per year    Active Member of [employer] or Organizations: Yes    Attends Engineer, structural: More than 4 times per year    Marital Status: Married     Family History: The patient's family history includes Arthritis in his mother; Colon cancer in his mother; Diabetes in his sister; Heart attack  in his father; Hyperlipidemia in his brother, brother, mother, and sister; Hypertension in his brother, mother, and sister; Other in his brother. ROS:   Please see the history of present illness.    All 14 point review of systems negative except as described per history of present illness.  EKGs/Labs/Other Studies Reviewed:    The following studies were reviewed today:   EKG:       Recent Labs: 08/25/2022: B Natriuretic Peptide 72.3 06/08/2023: ALT 26; BUN 18; Creatinine 1.06; Hemoglobin 11.3; Platelet Count 204; Potassium 3.7; Sodium 139; TSH 1.270  Recent Lipid Panel    Component Value Date/Time   CHOL 172 05/19/2023 1043   CHOL 169 10/21/2021 1038   TRIG 60.0 05/19/2023 1043   HDL 62.10 05/19/2023 1043   HDL 72 10/21/2021 1038   CHOLHDL 3 05/19/2023 1043   VLDL 12.0 05/19/2023 1043   LDLCALC 98 05/19/2023 1043   LDLCALC 87 10/21/2021 1038    Physical Exam:    VS:  BP (!) 98/58   Pulse 88   Ht 5\' 9"  (1.753 m)   Wt 131 lb 9.6 oz (59.7 kg)   SpO2 92%   BMI 19.43 kg/m     Wt Readings from Last 3 Encounters:  06/25/23 131 lb 9.6 oz (59.7 kg)  06/08/23 134 lb 6.4 oz (61 kg)  05/19/23 133 lb 12.8 oz (60.7 kg)     GENERAL:  Well nourished, well developed in no acute distress NECK: No JVD; No carotid bruits CARDIAC: RRR, S1 and S2 present, 3/6 systolic murmur and 3/6 diastolic murmur heard all over the precordium. CHEST:  Clear to auscultation without  rales, wheezing or rhonchi  Extremities: No pitting pedal edema. Pulses bilaterally symmetric with radial 2+ and dorsalis pedis 2+ NEUROLOGIC:  Alert and oriented x 3  Medication Adjustments/Labs and Tests Ordered: Current medicines are reviewed at length with the patient today.  Concerns regarding medicines are outlined above.  Orders Placed This Encounter  Procedures   ECHOCARDIOGRAM COMPLETE   No orders of the defined types were placed in this encounter.   Signed, Shantina Chronister reddy Arnetra Terris, MD, MPH, [facility]. 06/25/2023 10:52 AM    [facility]

## 2023-06-25 NOTE — Assessment & Plan Note (Signed)
 Lipid panel from June 08, 2023 LDL 98, HDL 62, total cholesterol 161, triglycerides 60. Optimal considering no significant coronary atherosclerosis but does have aortic atherosclerosis on cardiac CT.  Continue with Crestor  5 mg once daily.

## 2023-06-25 NOTE — Assessment & Plan Note (Signed)
 Mild-moderate aortic insufficiency on echocardiogram from April 2025.  Reviewed with him the findings. Follow-up interval assessment with repeat echocardiogram tentatively in 1 year.

## 2023-06-25 NOTE — Assessment & Plan Note (Addendum)
 Well-controlled.  Asymptomatic. Currently on hydrochlorothiazide -lisinopril  12.5 mg / 10 mg once daily. Advised him to monitor blood pressures at home.  If persistently low blood pressures may consider titrating down his medications.

## 2023-06-29 ENCOUNTER — Inpatient Hospital Stay: Payer: Medicare Other | Attending: Internal Medicine

## 2023-06-29 ENCOUNTER — Encounter: Payer: Self-pay | Admitting: Nurse Practitioner

## 2023-06-29 ENCOUNTER — Inpatient Hospital Stay: Payer: Medicare Other

## 2023-06-29 ENCOUNTER — Inpatient Hospital Stay (HOSPITAL_BASED_OUTPATIENT_CLINIC_OR_DEPARTMENT_OTHER): Payer: Medicare Other | Admitting: Internal Medicine

## 2023-06-29 ENCOUNTER — Inpatient Hospital Stay (HOSPITAL_BASED_OUTPATIENT_CLINIC_OR_DEPARTMENT_OTHER): Admitting: Nurse Practitioner

## 2023-06-29 ENCOUNTER — Inpatient Hospital Stay: Admitting: Dietician

## 2023-06-29 VITALS — BP 101/60 | HR 67 | Temp 97.6°F | Resp 17 | Ht 69.0 in | Wt 130.7 lb

## 2023-06-29 DIAGNOSIS — Z515 Encounter for palliative care: Secondary | ICD-10-CM

## 2023-06-29 DIAGNOSIS — C3492 Malignant neoplasm of unspecified part of left bronchus or lung: Secondary | ICD-10-CM

## 2023-06-29 DIAGNOSIS — C7951 Secondary malignant neoplasm of bone: Secondary | ICD-10-CM | POA: Insufficient documentation

## 2023-06-29 DIAGNOSIS — C3412 Malignant neoplasm of upper lobe, left bronchus or lung: Secondary | ICD-10-CM | POA: Insufficient documentation

## 2023-06-29 DIAGNOSIS — G893 Neoplasm related pain (acute) (chronic): Secondary | ICD-10-CM | POA: Diagnosis not present

## 2023-06-29 DIAGNOSIS — Z87891 Personal history of nicotine dependence: Secondary | ICD-10-CM | POA: Insufficient documentation

## 2023-06-29 DIAGNOSIS — Z79620 Long term (current) use of immunosuppressive biologic: Secondary | ICD-10-CM | POA: Diagnosis not present

## 2023-06-29 DIAGNOSIS — Z5112 Encounter for antineoplastic immunotherapy: Secondary | ICD-10-CM | POA: Diagnosis present

## 2023-06-29 DIAGNOSIS — R530 Neoplastic (malignant) related fatigue: Secondary | ICD-10-CM | POA: Diagnosis not present

## 2023-06-29 DIAGNOSIS — C3490 Malignant neoplasm of unspecified part of unspecified bronchus or lung: Secondary | ICD-10-CM | POA: Diagnosis not present

## 2023-06-29 LAB — CMP (CANCER CENTER ONLY)
ALT: 21 U/L (ref 0–44)
AST: 18 U/L (ref 15–41)
Albumin: 4.3 g/dL (ref 3.5–5.0)
Alkaline Phosphatase: 51 U/L (ref 38–126)
Anion gap: 6 (ref 5–15)
BUN: 30 mg/dL — ABNORMAL HIGH (ref 8–23)
CO2: 28 mmol/L (ref 22–32)
Calcium: 9.3 mg/dL (ref 8.9–10.3)
Chloride: 101 mmol/L (ref 98–111)
Creatinine: 1.05 mg/dL (ref 0.61–1.24)
GFR, Estimated: >60 mL/min (ref >60)
Glucose, Bld: 99 mg/dL (ref 70–99)
Potassium: 4.2 mmol/L (ref 3.5–5.1)
Sodium: 135 mmol/L (ref 135–145)
Total Bilirubin: 0.3 mg/dL (ref 0.0–1.2)
Total Protein: 7.3 g/dL (ref 6.5–8.1)

## 2023-06-29 LAB — CBC WITH DIFFERENTIAL (CANCER CENTER ONLY)
Abs Immature Granulocytes: 0 10*3/uL (ref 0.00–0.07)
Basophils Absolute: 0 10*3/uL (ref 0.0–0.1)
Basophils Relative: 1 %
Eosinophils Absolute: 0 10*3/uL (ref 0.0–0.5)
Eosinophils Relative: 1 %
HCT: 35.4 % — ABNORMAL LOW (ref 39.0–52.0)
Hemoglobin: 11.9 g/dL — ABNORMAL LOW (ref 13.0–17.0)
Immature Granulocytes: 0 %
Lymphocytes Relative: 31 %
Lymphs Abs: 1.1 10*3/uL (ref 0.7–4.0)
MCH: 30.1 pg (ref 26.0–34.0)
MCHC: 33.6 g/dL (ref 30.0–36.0)
MCV: 89.6 fL (ref 80.0–100.0)
Monocytes Absolute: 0.5 10*3/uL (ref 0.1–1.0)
Monocytes Relative: 13 %
Neutro Abs: 2 10*3/uL (ref 1.7–7.7)
Neutrophils Relative %: 54 %
Platelet Count: 188 10*3/uL (ref 150–400)
RBC: 3.95 MIL/uL — ABNORMAL LOW (ref 4.22–5.81)
RDW: 12.6 % (ref 11.5–15.5)
WBC Count: 3.6 10*3/uL — ABNORMAL LOW (ref 4.0–10.5)
nRBC: 0 % (ref 0.0–0.2)

## 2023-06-29 LAB — TSH: TSH: 1.04 u[IU]/mL (ref 0.350–4.500)

## 2023-06-29 MED ORDER — SODIUM CHLORIDE 0.9 % IV SOLN
350.0000 mg | Freq: Once | INTRAVENOUS | Status: AC
  Administered 2023-06-29: 350 mg via INTRAVENOUS
  Filled 2023-06-29: qty 7

## 2023-06-29 MED ORDER — SODIUM CHLORIDE 0.9 % IV SOLN: Freq: Once | INTRAVENOUS | Status: AC

## 2023-06-29 NOTE — Patient Instructions (Signed)
 [facility] - A DEPT OF [facility]  Discharge Instructions: Thank you for choosing [facility] to provide your oncology and hematology care.   If you have a lab appointment with the Cancer Center, please go directly to the Cancer Center and check in at the registration area.   Wear comfortable clothing and clothing appropriate for easy access to any Portacath or PICC line.   We strive to give you quality time with your provider. You may need to reschedule your appointment if you arrive late (15 or more minutes).  Arriving late affects you and other patients whose appointments are after yours.  Also, if you miss three or more appointments without notifying the office, you may be dismissed from the clinic at the provider's discretion.      For prescription refill requests, have your pharmacy contact our office and allow 72 hours for refills to be completed.    Today you received the following chemotherapy and/or immunotherapy agents: Libtayo.       To help prevent nausea and vomiting after your treatment, we encourage you to take your nausea medication as directed.  BELOW ARE SYMPTOMS THAT SHOULD BE REPORTED IMMEDIATELY: *FEVER GREATER THAN 100.4 F (38 C) OR HIGHER *CHILLS OR SWEATING *NAUSEA AND VOMITING THAT IS NOT CONTROLLED WITH YOUR NAUSEA MEDICATION *UNUSUAL SHORTNESS OF BREATH *UNUSUAL BRUISING OR BLEEDING *URINARY PROBLEMS (pain or burning when urinating, or frequent urination) *BOWEL PROBLEMS (unusual diarrhea, constipation, pain near the anus) TENDERNESS IN MOUTH AND THROAT WITH OR WITHOUT PRESENCE OF ULCERS (sore throat, sores in mouth, or a toothache) UNUSUAL RASH, SWELLING OR PAIN  UNUSUAL VAGINAL DISCHARGE OR ITCHING   Items with * indicate a potential emergency and should be followed up as soon as possible or go to the Emergency Department if any problems should occur.  Please show the CHEMOTHERAPY ALERT CARD or IMMUNOTHERAPY  ALERT CARD at check-in to the Emergency Department and triage nurse.  Should you have questions after your visit or need to cancel or reschedule your appointment, please contact [facility] - A DEPT OF [facility]  Dept: [phone]  and follow the prompts.  Office hours are 8:00 a.m. to 4:30 p.m. Monday - Friday. Please note that voicemails left after 4:00 p.m. may not be returned until the following business day.  We are closed weekends and major holidays. You have access to a nurse at all times for urgent questions. Please call the main number to the clinic Dept: [phone] and follow the prompts.   For any non-urgent questions, you may also contact your provider using MyChart. We now offer e-Visits for anyone 71 and older to request care online for non-urgent symptoms. For details visit [email or web address].   Also download the MyChart app! Go to the app store, search "MyChart", open the app, select [facility], and log in with your MyChart username and password.

## 2023-06-29 NOTE — Progress Notes (Signed)
 [facility] Telephone:(336) [phone]   Fax:([phone]  OFFICE PROGRESS NOTE  [name], MD [phone] [place] [address]  DIAGNOSIS: Stage IV (T2a, N2, M1 B) non-small cell lung cancer, squamous cell carcinoma presented with left upper lobe lung mass in addition to AP window lymphadenopathy and suspicious bone metastasis to the T8 and L4 vertebrae in addition to retroperitoneal metastatic soft tissue nodule diagnosed in April 2024.   Molecular studies by [id number] showed no actionable mutations and PD-L1 expression of 70%.  PRIOR THERAPY:  1) Palliative radiotherapy to the T8 and L4 metastatic bone disease. 2) Systemic chemotherapy with carboplatin  for AUC of 5, paclitaxel  175 Mg/M2 and Libtayo  (Cempilimab) 350 Mg IV every 3 weeks with Neulasta  support for 4 cycles    CURRENT THERAPY: Maintenance treatment with single agent Libtayo  (Cempilimab) 350 Mg IV every 3 weeks.  First dose September 08, 2022.  Status post 14 cycles.  INTERVAL HISTORY: Albert Hardy. 68 y.o. male returns to the clinic today for follow-up visit. Discussed the use of AI scribe software for clinical note transcription with the patient, who gave verbal consent to proceed.  History of Present Illness   Albert Hardy. is a 67 year old male with stage four non-small cell lung cancer who presents for evaluation before starting cycle fifteen of Libtayo .  He was diagnosed with stage four non-small cell lung cancer, squamous cell carcinoma, in April 2024. The cancer has no actionable mutations and a PD-L1 expression of 70%.  He underwent palliative radiotherapy for metastatic bone lesions at T8 and L4, followed by systemic chemotherapy with four cycles of carboplatin , paclitaxel , and Libtayo . He is currently on maintenance treatment with Libtayo  every three weeks and has completed fourteen cycles.  He has no new complaints since his last visit, except for difficulty gaining weight  despite eating regularly, including late-night meals. He describes having 'little specks of blood' in his cough about two weeks ago, which occurred once and has not recurred.  No nausea, vomiting, diarrhea, headaches, visual changes, chest pain, or breathing issues.         MEDICAL HISTORY: Past Medical History:  Diagnosis Date   Aortic regurgitation    Aortic stenosis 04/04/2018   Atherosclerosis of native arteries of the extremities with intermittent claudication 09/08/2013   Atherosclerosis of native artery of extremity with intermittent claudication (HCC) 09/08/2013   IMO SNOMED Dx Update Oct 2024     Bilateral impacted cerumen 10/13/2018   Bilateral sensorineural hearing loss 12/14/2018   Cancer (HCC)    lung   Dyspnea    Encounter for antineoplastic chemotherapy 08/18/2022   Encounter for antineoplastic immunotherapy 08/18/2022   Former moderate cigarette smoker (10-19 per day) 05/20/2012   Quit smoking Nov 2013 when hospitalized w/ HTN and chest pain(diagnosed w/ valvular heart disease)   GERD (gastroesophageal reflux disease)    Heart murmur    History of radiation therapy    Lumbar Spine, Thoracic Spine- 06/25/22-07/10/22- Dr. 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  MEDICATIONS:  Current Outpatient Medications  Medication  Sig Dispense Refill   albuterol  (VENTOLIN  HFA) 108 (90 Base) MCG/ACT inhaler Inhale 2 puffs into the lungs every 6 (six) hours as needed for wheezing or shortness of breath. 8 g 6   Ascorbic Acid (VITAMIN C PO) Take 1 tablet by mouth daily.     Cholecalciferol (VITAMIN D3) 1000 UNITS CAPS Take 1,000 Units by  mouth daily.     Coenzyme Q10 (CO Q 10 PO) Take 1 tablet by mouth daily.     Cyanocobalamin (VITAMIN B-12 PO) Take 1 tablet by mouth daily.     cyclobenzaprine (FLEXERIL) 5 MG tablet Take 1 tablet (5 mg total) by mouth 3 (three) times daily as needed for muscle spasms. 30 tablet 0   ELIQUIS  5 MG TABS tablet Take 1 tablet by mouth twice daily 60 tablet 0   ezetimibe  (ZETIA ) 10 MG tablet Take 1 tablet (10 mg total) by mouth daily. 90 tablet 3   fluticasone  (FLONASE ) 50 MCG/ACT nasal spray Place 1-2 sprays into both nostrils daily. 16 g 6   KRILL OIL PO Take 1 tablet by mouth daily.     levofloxacin  (LEVAQUIN ) 500 MG tablet Take 1 tablet (500 mg total) by mouth daily. 7 tablet 0   lisinopril -hydrochlorothiazide  (ZESTORETIC ) 10-12.5 MG tablet Take 1 tablet by mouth daily. 90 tablet 1   MAGNESIUM PO Take 1 tablet by mouth daily.     metoCLOPramide  (REGLAN ) 10 MG tablet Take 1 tablet (10 mg total) by mouth 4 (four) times daily -  before meals and at bedtime. 56 tablet 0   metoprolol  tartrate (LOPRESSOR ) 25 MG tablet Take 1 tablet (25 mg total) by mouth once for 1 dose. Please take this medication 2 hours before CT 1 tablet 0   morphine  (MS CONTIN ) 15 MG 12 hr tablet Take 1 tablet (15 mg total) by mouth every 12 (twelve) hours. 60 tablet 0   Multiple Vitamins-Minerals (CENTRUM SILVER PO) Take 1 tablet by mouth daily.     Multiple Vitamins-Minerals (ZINC PO) Take 1 tablet by mouth daily.     Naphazoline-Pheniramine (OPCON-A) 0.027-0.315 % SOLN Place 1 drop into both eyes daily as needed (redness).     nitroGLYCERIN  (NITROSTAT ) 0.4 MG SL tablet Place 1 tablet (0.4 mg total) under the tongue every 5 (five) minutes as needed for chest pain. 25 tablet 1   omeprazole  (PRILOSEC) 20 MG capsule Take 1 capsule by mouth once daily 30 capsule 0   oxyCODONE  (OXY IR/ROXICODONE ) 5 MG immediate release tablet Take 1 tablet (5 mg total) by mouth every 4 (four) hours as needed for severe pain (pain score 7-10). 90 tablet  0   potassium chloride  SA (KLOR-CON  M) 20 MEQ tablet Take 1 tablet (20 mEq total) by mouth daily. 6 tablet 0   prochlorperazine  (COMPAZINE ) 10 MG tablet Take 1 tablet (10 mg total) by mouth every 6 (six) hours as needed for nausea or vomiting. 30 tablet 0   rosuvastatin  (CRESTOR ) 5 MG tablet Take 1 tablet (5 mg total) by mouth at bedtime. 90 tablet 1   triamcinolone  0.1%-Eucerin equivalent 1:1 cream mixture Apply topically 3 (three) times daily as needed. 480 g 2   TURMERIC PO Take 1 tablet by mouth 3 (three) times a week.     umeclidinium-vilanterol (ANORO ELLIPTA ) 62.5-25 MCG/ACT AEPB Inhale 1 puff into the lungs daily. 180 each 3   No current facility-administered medications for this visit.    SURGICAL HISTORY:  Past Surgical History:  Procedure Laterality Date   BRONCHIAL BIOPSY  05/26/2022   Procedure: BRONCHIAL BIOPSIES;  Surgeon: Prudy Brownie, DO;  Location: [facility];  Service: Pulmonary;;   BRONCHIAL BRUSHINGS  05/26/2022   Procedure: BRONCHIAL BRUSHINGS;  Surgeon: Prudy Brownie, DO;  Location: [facility];  Service: Pulmonary;;   NO PAST SURGERIES     VIDEO BRONCHOSCOPY  05/26/2022   Procedure: VIDEO BRONCHOSCOPY WITHOUT FLUORO;  Surgeon: Prudy Brownie, DO;  Location: [facility];  Service: Pulmonary;;    REVIEW OF SYSTEMS:  A comprehensive review of systems was negative except for: Respiratory: positive for cough   PHYSICAL EXAMINATION: General appearance: alert, cooperative, and no distress Head: Normocephalic, without obvious abnormality, atraumatic Neck: no adenopathy, no JVD, supple, symmetrical, trachea midline, and thyroid  not enlarged, symmetric, no tenderness/mass/nodules Lymph nodes: Cervical, supraclavicular, and axillary nodes normal. Resp: clear to auscultation bilaterally Back: symmetric, no curvature. ROM normal. No CVA tenderness. Cardio: regular rate and rhythm, S1, S2 normal, no murmur, click, rub or gallop GI: soft, non-tender; bowel sounds  normal; no masses,  no organomegaly Extremities: extremities normal, atraumatic, no cyanosis or edema  ECOG PERFORMANCE STATUS: 1 - Symptomatic but completely ambulatory  Blood pressure 101/60, pulse 67, temperature 97.6 F (36.4 C), temperature source Temporal, resp. rate 17, height 5\' 9"  (1.753 m), weight 130 lb 11.2 oz (59.3 kg), SpO2 100%.  LABORATORY DATA: Lab Results  Component Value Date   WBC 3.6 (L) 06/29/2023   HGB 11.9 (L) 06/29/2023   HCT 35.4 (L) 06/29/2023   MCV 89.6 06/29/2023   PLT 188 06/29/2023      Chemistry      Component Value Date/Time   NA 135 06/29/2023 1013   NA 146 (H) 01/05/2023 1114   K 4.2 06/29/2023 1013   CL 101 06/29/2023 1013   CO2 28 06/29/2023 1013   BUN 30 (H) 06/29/2023 1013   BUN 11 01/05/2023 1114   CREATININE 1.05 06/29/2023 1013   CREATININE 1.07 08/27/2014 1511      Component Value Date/Time   CALCIUM  9.3 06/29/2023 1013   ALKPHOS 51 06/29/2023 1013   AST 18 06/29/2023 1013   ALT 21 06/29/2023 1013   BILITOT 0.3 06/29/2023 1013       RADIOGRAPHIC STUDIES: No results found.   ASSESSMENT AND PLAN: This is a very pleasant 68 years old African-American male with Stage IV (T2a, N2, M1b) non-small cell lung cancer, squamous cell carcinoma presented with left upper lobe lung mass in addition to AP window lymphadenopathy and suspicious bone metastasis to the T8 and L4 vertebrae as well as right retroperitoneal metastatic nodule diagnosed in April 2024.  Molecular studies by [id number] showed no actionable mutations and PD-L1 expression of 70%. The patient underwent palliative combination of systemic chemotherapy with carboplatin  for AUC of 5, paclitaxel  175 Mg/M2 and immunotherapy with Libtayo  (Cempilimab) 350 Mg IV every 3 weeks with Neulasta  support for 4 cycles followed by maintenance treatment with Libtayo  (Cempilimab) of the patient has no disease progression after cycle #4.  He is status post 4 cycles. He is currently undergoing  maintenance treatment with single agent Libtayo  (Cempilimab) 350 Mg IV every 3 weeks status post 14 cycles.  He has been tolerating this treatment fairly well. Assessment and Plan    Stage 4 non-small cell lung cancer Stage 4 non-small cell lung cancer, squamous cell carcinoma, diagnosed in April 2024. No actionable mutations and PD-L1 expression of 70%. Post palliative radiotherapy to T8 and L4 metastatic bone lesions. Completed four cycles of carboplatin , paclitaxel , and Libtayo . Currently on  maintenance treatment with Libtayo  every three weeks, post fourteen cycles. No significant complaints except inability to gain weight. Recent episode of hemoptysis, likely due to irritation from coughing, resolved spontaneously. - Proceed with cycle fifteen of Libtayo . - Schedule a scan 7-10 days before the next visit.    For the back pain he underwent palliative radiotherapy to the bone disease at T8 and L4 vertebral lesions. For the bone metastasis, he is currently on treatment with Xgeva  120 mg subcutaneously every 6 weeks. He was advised to call immediately if he has any concerning symptoms in the interval.  The patient voices understanding of current disease status and treatment options and is in agreement with the current care plan.  All questions were answered. The patient knows to call the clinic with any problems, questions or concerns. We can certainly see the patient much sooner if necessary.  The total time spent in the appointment was 20 minutes.  Disclaimer: This note was dictated with voice recognition software. Similar sounding words can inadvertently be transcribed and may not be corrected upon review.

## 2023-06-29 NOTE — Progress Notes (Signed)
 Palliative Medicine [facility]  Telephone:(336) [phone] Fax:(336) [phone]   Name: Albert Hardy. Date: 06/29/2023 MRN: [id number]  DOB: 31-Mar-1955  Patient Care Team: [name], MD as PCP - General (Family Medicine)    INTERVAL HISTORY: Albert Hardy. is a 68 y.o. 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 Palliative ask to see for symptom management and goals of care   SOCIAL HISTORY:     reports that he quit smoking about 11 years ago. His smoking use included cigarettes. He started smoking about 41 years ago. He has a 30 pack-year smoking history. He has never used smokeless tobacco. He reports current alcohol use. He reports current drug use. Frequency: 1.00 time per week. Drug: Marijuana.  ADVANCE DIRECTIVES:  None on file  CODE STATUS: Full code  PAST MEDICAL HISTORY: Past Medical History:  Diagnosis Date   Aortic regurgitation    Aortic stenosis 04/04/2018   Atherosclerosis of native arteries of the extremities with intermittent claudication 09/08/2013   Atherosclerosis of native artery of extremity with intermittent claudication (HCC) 09/08/2013   IMO SNOMED Dx Update Oct 2024     Bilateral impacted cerumen 10/13/2018   Bilateral sensorineural hearing loss 12/14/2018   Cancer (HCC)    lung   Dyspnea    Encounter for antineoplastic chemotherapy 08/18/2022   Encounter for antineoplastic immunotherapy 08/18/2022   Former moderate cigarette smoker (10-19 per day) 05/20/2012   Quit smoking Nov 2013 when hospitalized w/ HTN and chest pain(diagnosed w/ valvular heart disease)   GERD (gastroesophageal reflux disease)    Heart murmur    History of radiation therapy    Lumbar Spine, Thoracic Spine- 06/25/22-07/10/22- Dr. 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  MEDICATIONS:  Current Outpatient Medications  Medication Sig Dispense Refill   albuterol  (VENTOLIN  HFA) 108 (90 Base) MCG/ACT inhaler Inhale 2 puffs into the lungs every 6 (six) hours as needed for wheezing or shortness of breath. 8 g 6   Ascorbic Acid (VITAMIN C PO) Take 1 tablet by mouth daily.     Cholecalciferol (VITAMIN D3) 1000 UNITS CAPS Take 1,000 Units by mouth daily.     Coenzyme Q10 (CO Q 10 PO) Take 1 tablet by mouth daily.     Cyanocobalamin (VITAMIN B-12 PO) Take 1 tablet by mouth daily.     cyclobenzaprine (FLEXERIL) 5 MG tablet Take 1 tablet (5 mg total) by mouth 3 (three) times daily as needed for muscle spasms. 30 tablet 0   ELIQUIS  5 MG TABS tablet Take 1 tablet by mouth twice daily 60 tablet 0   ezetimibe  (ZETIA ) 10 MG tablet Take 1 tablet (10 mg total) by mouth daily. 90 tablet 3   fluticasone  (FLONASE ) 50 MCG/ACT nasal spray Place 1-2 sprays into both nostrils daily. 16 g 6   KRILL OIL PO Take 1 tablet by mouth daily.     levofloxacin  (LEVAQUIN ) 500 MG tablet Take 1 tablet (500 mg total) by mouth daily. 7 tablet 0   lisinopril -hydrochlorothiazide  (ZESTORETIC ) 10-12.5 MG tablet Take 1 tablet by  mouth daily. 90 tablet 1   MAGNESIUM PO Take 1 tablet by mouth daily.     metoCLOPramide  (REGLAN ) 10 MG tablet Take 1 tablet (10 mg total) by mouth 4 (four) times daily -  before meals and at bedtime. 56 tablet 0   metoprolol  tartrate (LOPRESSOR ) 25 MG tablet Take 1 tablet (25 mg total) by mouth once for 1 dose. Please take this medication 2 hours before CT 1 tablet 0   morphine  (MS CONTIN ) 15 MG 12 hr tablet Take 1 tablet (15 mg total) by mouth every 12 (twelve) hours. 60 tablet 0   Multiple Vitamins-Minerals (CENTRUM SILVER PO) Take 1 tablet by  mouth daily.     Multiple Vitamins-Minerals (ZINC PO) Take 1 tablet by mouth daily.     Naphazoline-Pheniramine (OPCON-A) 0.027-0.315 % SOLN Place 1 drop into both eyes daily as needed (redness).     nitroGLYCERIN  (NITROSTAT ) 0.4 MG SL tablet Place 1 tablet (0.4 mg total) under the tongue every 5 (five) minutes as needed for chest pain. 25 tablet 1   omeprazole  (PRILOSEC) 20 MG capsule Take 1 capsule by mouth once daily 30 capsule 0   oxyCODONE  (OXY IR/ROXICODONE ) 5 MG immediate release tablet Take 1 tablet (5 mg total) by mouth every 4 (four) hours as needed for severe pain (pain score 7-10). 90 tablet 0   potassium chloride  SA (KLOR-CON  M) 20 MEQ tablet Take 1 tablet (20 mEq total) by mouth daily. 6 tablet 0   prochlorperazine  (COMPAZINE ) 10 MG tablet Take 1 tablet (10 mg total) by mouth every 6 (six) hours as needed for nausea or vomiting. 30 tablet 0   rosuvastatin  (CRESTOR ) 5 MG tablet Take 1 tablet (5 mg total) by mouth at bedtime. 90 tablet 1   triamcinolone  0.1%-Eucerin equivalent 1:1 cream mixture Apply topically 3 (three) times daily as needed. 480 g 2   TURMERIC PO Take 1 tablet by mouth 3 (three) times a week.     umeclidinium-vilanterol (ANORO ELLIPTA ) 62.5-25 MCG/ACT AEPB Inhale 1 puff into the lungs daily. 180 each 3   No current facility-administered medications for this visit.    VITAL SIGNS: There were no vitals taken for this visit. There were no vitals filed for this visit.  Estimated body mass index is 19.3 kg/m as calculated from the following:   Height as of an earlier encounter on 06/29/23: 5\' 9"  (1.753 m).   Weight as of an earlier encounter on 06/29/23: 130 lb 11.2 oz (59.3 kg).     Latest Ref Rng & Units 06/29/2023   10:13 AM 06/08/2023   10:07 AM 05/18/2023   10:02 AM  CBC  WBC 4.0 - 10.5 K/uL 3.6  4.1  3.9   Hemoglobin 13.0 - 17.0 g/dL 65.7  84.6  96.2   Hematocrit 39.0 - 52.0 % 35.4  34.4  34.9   Platelets 150 - 400 K/uL 188  204  201        Latest Ref Rng  & Units 06/29/2023   10:13 AM 06/08/2023   10:07 AM 05/18/2023   10:02 AM  CMP  Glucose 70 - 99 mg/dL 99  952  841   BUN 8 - 23 mg/dL 30  18  23    Creatinine 0.61 - 1.24 mg/dL 3.24  4.01  0.27   Sodium 135 - 145 mmol/L 135  139  136   Potassium 3.5 - 5.1 mmol/L 4.2  3.7  3.9   Chloride 98 - 111 mmol/L 101  103  103  CO2 22 - 32 mmol/L 28  27  24    Calcium  8.9 - 10.3 mg/dL 9.3  9.1  9.2   Total Protein 6.5 - 8.1 g/dL 7.3  7.3  7.5   Total Bilirubin 0.0 - 1.2 mg/dL 0.3  0.3  0.3   Alkaline Phos 38 - 126 U/L 51  49  53   AST 15 - 41 U/L 18  22  24    ALT 0 - 44 U/L 21  26  28      PERFORMANCE STATUS (ECOG) : 1 - Symptomatic but completely ambulatory   Physical Exam General: NAD Cardiovascular: RRR Pulmonary: normal breathing pattern  Extremities: no edema, no joint deformities Skin: no rashes Neurological: AAO x4  IMPRESSION:  Mr. Albert Hardy. Presents to clinic for symptom management follow-up. No acute distress noted.  Tolerating treatment without difficulty.  Patient continues to remain as active as possible.  Denies any concerns with nausea, vomiting, constipation, or diarrhea.  Constipation managed with daily stool softeners. Occasional fatigue but remains active. He expresses concerns that his appetite has significantly improved and he is eating more now than ever however he is not gaining much weight. He is stable at 130lbs. We discussed continuing to eat with no limitations as we continue to closely monitor weight.   Mr. Fitzpatrick reports his pain is controlled with current regimen.  He is much appreciative of this.  Current regimen consist of MS Contin  every 12 hours and oxycodone  as needed for breakthrough pain.  Does not require repeat medication around-the-clock.  No adjustments at this time.  Will continue to closely monitor and support.  All questions answered and support provided.  Goals of Care  06/18/22-  We discussed his current illness and what it means in the larger  context of hsi on-going co-morbidities. Natural disease trajectory and expectations were discussed.  Mr. Dunwoody and his wife are realistic in their expectations and understanding of his incurable cancer. He knows all treatment is palliative focused. Wishes to continue taking things one day at a time focusing on his quality of life allowing him every opportunity to continue to thrive.   We discussed Her current illness and what it means in the larger context of Her on-going co-morbidities. Natural disease trajectory and expectations were discussed.  I discussed the importance of continued conversation with family and their medical providers regarding overall plan of care and treatment options, ensuring decisions are within the context of the patients values and GOCs.  Assessment and Plan  Chronic Cancer Related Pain Management Patient reports pain is controlled overall. Some days are better than others. No adjustments at this time.  -Continue current regimen of MS Contin  and oxycodone  as needed. -Will continue to closely follow and adjust regimen as needed.  Constipation No current issues. Patient has been taking Senna-S 2 caps daily. -Continue current regimen.  I will plan to follow-up with patient in 4-6 weeks. Sooner if needed.  Patient expressed understanding and was in agreement with this plan. He also understands that He can call the clinic at any time with any questions, concerns, or complaints.   Any controlled substances utilized were prescribed in the context of palliative care. PDMP has been reviewed.   Visit consisted of counseling and education dealing with the complex and emotionally intense issues of symptom management and palliative care in the setting of serious and potentially life-threatening illness.  Dellia Ferguson, AGPCNP-BC  Palliative Medicine Team/[facility]

## 2023-06-29 NOTE — Progress Notes (Signed)
 Nutrition Assessment   Reason for Assessment: +MST (wt loss)   ASSESSMENT: 68 year old male with stage IV SCC of left lung to bone. He is currently receiving maintenance Libtayo  q21d. Patient is under the care of Dr. Marguerita Shih.   Past medical history includes HTN, PVD with claudication, aortic insufficiency, panlobular emphysema, GERD, bilateral sensorineural hearing loss, HLD, former cigar smoker, heart murmur, dyspnea.   Met with patient in infusion. He reports good appetite and eating well. Patient snacks often including at bedtime/late night. He is disappointed with weight trends. Yesterday had bacon and eggs for breakfast. He did not have lunch. Recalls grilled chicken and pork chop with salad for dinner. Pt snacks on a variety of foods including spanish olives, chips/salsa, tuna fish. He usually drinks an Ensure Complete. Patient reports episodes of diarrhea with treatment. Says it starts in the morning and goes until the early morning hours the next day. Patient does not eat when this occurs.   Nutrition Focused Physical Exam: deferred    Medications: D3, B12, Co Q10, flexeril, eliquis , zetia , levaquin , Mg, reglan , lopressor , morphine , MVI, zinc, prilosec, oxycodone    Labs: BUN 30   Anthropometrics:   Height: 5'9" Weight: 130 lb 11.2 oz  UBW: 170-175 lb (prior to diagnosis per pt - noted 175 lb 05/15/22) BMI: 19.30    NUTRITION DIAGNOSIS: Unintended wt loss related to cancer as evidenced by 26% wt loss in 13 months which is severe for time frame   INTERVENTION:  Educated on importance of adequate calorie and protein energy intake to maintain weights/strength Suggested smaller more frequent meals/snacks vs 2 larger meals, eating by the clock - snack ideas provided Educated on foods with protein, recommend protein source at every meal Encourage high calorie high protein foods - shake recipes provided  Encourage soft moist textures for ease of intake - handout with soft moist  protein foods provided  Continue Ensure Complete - samples + coupons  Educated on strategies for diarrhea - samples of Banatrol provided for pt to try  Contact information provided    MONITORING, EVALUATION, GOAL: Pt will tolerate increased calories and protein to promote wt gain    Next Visit: Tuesday June 10 during infusion

## 2023-06-30 ENCOUNTER — Encounter: Payer: Self-pay | Admitting: Internal Medicine

## 2023-06-30 ENCOUNTER — Other Ambulatory Visit: Payer: Self-pay | Admitting: Physician Assistant

## 2023-06-30 DIAGNOSIS — C3492 Malignant neoplasm of unspecified part of left bronchus or lung: Secondary | ICD-10-CM

## 2023-06-30 LAB — T4: T4, Total: 8.8 ug/dL (ref 4.5–12.0)

## 2023-07-06 ENCOUNTER — Other Ambulatory Visit: Payer: Self-pay | Admitting: Family Medicine

## 2023-07-06 DIAGNOSIS — I10 Essential (primary) hypertension: Secondary | ICD-10-CM

## 2023-07-07 ENCOUNTER — Other Ambulatory Visit: Payer: Self-pay | Admitting: Physician Assistant

## 2023-07-07 ENCOUNTER — Other Ambulatory Visit: Payer: Self-pay

## 2023-07-07 ENCOUNTER — Other Ambulatory Visit: Payer: Self-pay | Admitting: Family Medicine

## 2023-07-07 DIAGNOSIS — K219 Gastro-esophageal reflux disease without esophagitis: Secondary | ICD-10-CM

## 2023-07-07 DIAGNOSIS — I2699 Other pulmonary embolism without acute cor pulmonale: Secondary | ICD-10-CM

## 2023-07-07 MED ORDER — OMEPRAZOLE 20 MG PO CPDR: 20.0000 mg | DELAYED_RELEASE_CAPSULE | Freq: Every day | ORAL | 0 refills | Status: DC

## 2023-07-09 ENCOUNTER — Ambulatory Visit (HOSPITAL_COMMUNITY)
Admission: RE | Admit: 2023-07-09 | Discharge: 2023-07-09 | Disposition: A | Discharge status: Home / Self Care | Source: Ambulatory Visit | Attending: Internal Medicine | Admitting: Internal Medicine

## 2023-07-09 ENCOUNTER — Encounter (HOSPITAL_COMMUNITY): Payer: Self-pay

## 2023-07-09 DIAGNOSIS — C3490 Malignant neoplasm of unspecified part of unspecified bronchus or lung: Secondary | ICD-10-CM | POA: Diagnosis present

## 2023-07-09 MED ORDER — IOHEXOL 300 MG/ML  SOLN
100.0000 mL | Freq: Once | INTRAMUSCULAR | Status: AC | PRN
  Administered 2023-07-09: 100 mL via INTRAVENOUS

## 2023-07-15 ENCOUNTER — Other Ambulatory Visit: Payer: Self-pay

## 2023-07-15 DIAGNOSIS — G893 Neoplasm related pain (acute) (chronic): Secondary | ICD-10-CM

## 2023-07-15 DIAGNOSIS — C3492 Malignant neoplasm of unspecified part of left bronchus or lung: Secondary | ICD-10-CM

## 2023-07-15 DIAGNOSIS — Z515 Encounter for palliative care: Secondary | ICD-10-CM

## 2023-07-15 MED ORDER — MORPHINE SULFATE ER 15 MG PO TBCR: 15.0000 mg | EXTENDED_RELEASE_TABLET | Freq: Two times a day (BID) | ORAL | 0 refills | Status: DC

## 2023-07-15 MED ORDER — OXYCODONE HCL 5 MG PO TABS: 5.0000 mg | ORAL_TABLET | ORAL | 0 refills | Status: DC | PRN

## 2023-07-15 NOTE — Telephone Encounter (Signed)
 RN was notified by pt pharmacy that medication was not in stock, orders routed to secondary pharmacy.

## 2023-07-16 NOTE — Progress Notes (Signed)
 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    Molecular studies by [id number] showed no actionable mutations and PD-L1 expression of 70%.  PRIOR THERAPY: 1) Palliative radiotherapy to the T8 and L4 metastatic bone disease. 2) Systemic chemotherapy with carboplatin  for AUC of 5, paclitaxel  175 Mg/M2 and Libtayo  (Cempilimab) 350 Mg IV every 3 weeks with Neulasta  support for 4 cycles   CURRENT THERAPY: Maintenance treatment with single agent Libtayo  (Cempilimab) 350 Mg IV every 3 weeks.  First dose September 08, 2022.  Status post 15 cycles.  2) Xgeva  every 6 weeks 3) referral to radiation oncology for consideration of palliative radiation.   INTERVAL HISTORY: Albert Hardy. 68 y.o. male returns to the clinic today for a follow-up visit. He completed chemotherapy/immunotherpay as well as pallitive radiation to the bones. He is currently on maintenance immunotherapy.  The patient has noticed some changes in his health.  He does report some more persistent stomach "upset" with decreased appetite and nausea without any vomiting.  He does experience some constipation for which he takes Senokot which is not always effective.  He took his antiemetic once but he is not sure if it had any appreciable improvement in his nausea and vomiting.  This is impacted his appetite and desire to eat.  He previously was gaining weight back but his has been hovering around 130 pounds.  He does feel like he is having more shortness of breath with exertion and rest compared to prior.  He does have cough which produces phlegm.  He denies any blood  in the sputum.  He sometimes has a "jabbing" discomfort in the lower rib cage that is focal that comes and goes.  He is wondering if this is attributed to posture.  He takes oxycodone  for pain management.  He previously was taking this once every 12 hours but he has needed to take this once every 8 hours instead.  He is currently not taking Flexeril and has not been taking this for about a year.  He denies any fevers, chills, or night sweats.  He denies any diarrhea or vomiting.  He denies any rashes or skin changes.  He denies any headache or visual changes.  The patient did recently have a restaging CT scan.  He is here today for evaluation before undergoing his next cycle of treatment with cycle #16.  He is scheduled to see palliative care today.   MEDICAL HISTORY: Past Medical History:  Diagnosis Date   Aortic regurgitation    Aortic stenosis 04/04/2018   Atherosclerosis of native arteries of the extremities with intermittent claudication 09/08/2013   Atherosclerosis of native artery of extremity with intermittent claudication (HCC) 09/08/2013   IMO SNOMED Dx Update Oct 2024     Bilateral impacted cerumen 10/13/2018   Bilateral sensorineural hearing loss 12/14/2018   Cancer (HCC)    lung   Dyspnea    Encounter for antineoplastic chemotherapy 08/18/2022   Encounter for antineoplastic immunotherapy 08/18/2022   Former moderate cigarette smoker (10-19 per day) 05/20/2012   Quit smoking Nov 2013 when hospitalized w/ HTN and chest pain(diagnosed w/ valvular heart disease)  GERD (gastroesophageal reflux disease)    Heart murmur    History of radiation therapy    Lumbar Spine, Thoracic Spine- 06/25/22-07/10/22- Dr. 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  MEDICATIONS:  Current Outpatient Medications  Medication Sig Dispense Refill   albuterol  (VENTOLIN  HFA) 108 (90 Base) MCG/ACT inhaler Inhale 2 puffs into the lungs every 6 (six) hours as needed for wheezing or shortness of breath. 8 g 6   Ascorbic Acid (VITAMIN C PO) Take 1 tablet by mouth daily.     Cholecalciferol (VITAMIN D3) 1000 UNITS CAPS Take 1,000 Units by mouth daily.     Coenzyme Q10 (CO Q 10 PO) Take 1 tablet by mouth daily.     Cyanocobalamin (VITAMIN B-12 PO) Take 1 tablet by mouth daily.     cyclobenzaprine (FLEXERIL) 5 MG tablet Take 1 tablet (5 mg total) by mouth 3 (three) times daily as needed for muscle spasms. 30 tablet 0   ELIQUIS  5 MG TABS tablet Take 1 tablet by mouth twice daily 60 tablet 0   ezetimibe  (ZETIA ) 10 MG tablet Take 1 tablet (10 mg total) by mouth daily. 90 tablet 3   fluticasone  (FLONASE ) 50 MCG/ACT nasal spray Place 1-2 sprays into both nostrils daily. 16 g 6   KRILL OIL PO Take 1 tablet by mouth daily.     levofloxacin  (LEVAQUIN ) 500 MG tablet Take 1 tablet (500 mg total) by mouth daily. 7 tablet 0   lisinopril -hydrochlorothiazide  (ZESTORETIC ) 10-12.5 MG tablet Take 1 tablet by mouth once daily 90 tablet 0   MAGNESIUM PO Take 1 tablet by mouth daily.     metoCLOPramide  (REGLAN ) 10 MG tablet Take 1 tablet (10 mg total) by mouth 4 (four) times daily -  before meals and at bedtime. 56 tablet 0   metoprolol  tartrate (LOPRESSOR ) 25 MG tablet Take 1 tablet (25 mg total) by mouth once for 1 dose. Please take this medication 2 hours before CT 1 tablet 0   morphine  (MS CONTIN ) 15 MG 12 hr tablet Take 1 tablet (15 mg total) by mouth every 12 (twelve) hours. 60 tablet 0   Multiple Vitamins-Minerals (CENTRUM SILVER PO) Take 1 tablet by mouth daily.     Multiple Vitamins-Minerals (ZINC PO) Take 1 tablet by mouth daily.     Naphazoline-Pheniramine  (OPCON-A) 0.027-0.315 % SOLN Place 1 drop into both eyes daily as needed (redness).     nitroGLYCERIN  (NITROSTAT ) 0.4 MG SL tablet Place 1 tablet (0.4 mg total) under the tongue every 5 (five) minutes as needed for chest pain. 25 tablet 1   omeprazole  (PRILOSEC) 20 MG capsule Take 1 capsule by mouth once daily 30 capsule 0   omeprazole  (PRILOSEC) 20 MG capsule Take 1 capsule (20 mg total) by mouth daily. 30 capsule 0   oxyCODONE  (OXY IR/ROXICODONE ) 5 MG immediate release tablet Take 1 tablet (5 mg total) by mouth every 4 (four) hours as needed for severe pain (pain score 7-10). 90 tablet 0   potassium chloride  SA (KLOR-CON  M) 20 MEQ tablet Take 1 tablet (20 mEq total) by mouth daily. 6 tablet 0  prochlorperazine  (COMPAZINE ) 10 MG tablet Take 1 tablet (10 mg total) by mouth every 6 (six) hours as needed for nausea or vomiting. 30 tablet 0   rosuvastatin  (CRESTOR ) 5 MG tablet Take 1 tablet (5 mg total) by mouth at bedtime. 90 tablet 1   triamcinolone  0.1%-Eucerin equivalent 1:1 cream mixture Apply topically 3 (three) times daily as needed. 480 g 2   TURMERIC PO Take 1 tablet by mouth 3 (three) times a week.     umeclidinium-vilanterol (ANORO ELLIPTA ) 62.5-25 MCG/ACT AEPB Inhale 1 puff into the lungs daily. 180 each 3   No current facility-administered medications for this visit.    SURGICAL HISTORY:  Past Surgical History:  Procedure Laterality Date   BRONCHIAL BIOPSY  05/26/2022   Procedure: BRONCHIAL BIOPSIES;  Surgeon: Prudy Brownie, DO;  Location: [facility];  Service: Pulmonary;;   BRONCHIAL BRUSHINGS  05/26/2022   Procedure: BRONCHIAL BRUSHINGS;  Surgeon: Prudy Brownie, DO;  Location: [facility];  Service: Pulmonary;;   NO PAST SURGERIES     VIDEO BRONCHOSCOPY  05/26/2022   Procedure: VIDEO BRONCHOSCOPY WITHOUT FLUORO;  Surgeon: Prudy Brownie, DO;  Location: [facility];  Service: Pulmonary;;    REVIEW OF SYSTEMS:   Constitutional: Positive for fatigue and decreased  appetite. Negative for fever.  HENT: Negative for mouth sores, nosebleeds, sore throat and trouble swallowing.   Eyes: Positive for occasional blurry vision. Negative for icterus.  Respiratory: Positive for intermittent chronic cough and increase in dyspnea on exertion. Negative for hemoptysis and wheezing.  Cardiovascular: Negative for chest pain and leg swelling.  Gastrointestinal: Mild nausea and occasional constipation. Negative for abdominal pain, diarrhea, and vomiting.  Genitourinary: Negative for bladder incontinence, difficulty urinating, dysuria, frequency and hematuria.   Musculoskeletal: Negative for back pain, gait problem, neck pain and neck stiffness.  Skin: Negative for itching and rash.  Neurological: Negative for dizziness, extremity weakness, gait problem, headaches, light-headedness and seizures.  Hematological: Negative for adenopathy. Does not bruise/bleed easily.  Psychiatric/Behavioral: Negative for confusion, depression and sleep disturbance. The patient is not nervous/anxious.      PHYSICAL EXAMINATION:  There were no vitals taken for this visit.  ECOG PERFORMANCE STATUS: 1  Physical Exam  Constitutional: Oriented to person, place, and time and thin appearing male, and in no distress.  HENT:  Head: Normocephalic and atraumatic.  Mouth/Throat: Oropharynx is clear and moist. No oropharyngeal exudate.  Eyes: Conjunctivae are normal. Right eye exhibits no discharge. Left eye exhibits no discharge. No scleral icterus.  Neck: Normal range of motion. Neck supple.  Cardiovascular: Normal rate, regular rhythm, murmur noted (he sees cardiology) and intact distal pulses.   Pulmonary/Chest: Effort normal. Quiet breath sounds in left upper lung field. No respiratory distress. No wheezes. No rales.  Abdominal: Soft. Bowel sounds are normal. Exhibits no distension and no mass. There is no tenderness.  Musculoskeletal: Normal range of motion. Exhibits no edema.   Lymphadenopathy:    No cervical adenopathy.  Neurological: Alert and oriented to person, place, and time. Exhibits normal muscle tone. Gait normal. Coordination normal.  Skin: Skin is warm and dry. No rash noted. Not diaphoretic. No erythema. No pallor.  Psychiatric: Mood, memory and judgment normal.  Vitals reviewed.  LABORATORY DATA: Lab Results  Component Value Date   WBC 3.6 (L) 06/29/2023   HGB 11.9 (L) 06/29/2023   HCT 35.4 (L) 06/29/2023   MCV 89.6 06/29/2023   PLT 188 06/29/2023      Chemistry      Component  Value Date/Time   NA 135 06/29/2023 1013   NA 146 (H) 01/05/2023 1114   K 4.2 06/29/2023 1013   CL 101 06/29/2023 1013   CO2 28 06/29/2023 1013   BUN 30 (H) 06/29/2023 1013   BUN 11 01/05/2023 1114   CREATININE 1.05 06/29/2023 1013   CREATININE 1.07 08/27/2014 1511      Component Value Date/Time   CALCIUM  9.3 06/29/2023 1013   ALKPHOS 51 06/29/2023 1013   AST 18 06/29/2023 1013   ALT 21 06/29/2023 1013   BILITOT 0.3 06/29/2023 1013       RADIOGRAPHIC STUDIES:  CT Chest W Contrast Result Date: 07/10/2023 CLINICAL DATA:  History of non-small cell lung cancer status post chemo radiation, ongoing immunotherapy. Follow-up. * Tracking Code: BO * EXAM: CT CHEST, ABDOMEN, AND PELVIS WITH CONTRAST TECHNIQUE: Multidetector CT imaging of the chest, abdomen and pelvis was performed following the standard protocol during bolus administration of intravenous contrast. RADIATION DOSE REDUCTION: This exam was performed according to the departmental dose-optimization program which includes automated exposure control, adjustment of the mA and/or kV according to patient size and/or use of iterative reconstruction technique. CONTRAST:  OMNIPAQUE  IOHEXOL  300 MG/ML  SOLN COMPARISON:  Multiple priors including CT May 11, 2023 FINDINGS: CT CHEST FINDINGS Cardiovascular: New aortic atherosclerosis. Normal size heart. No significant pericardial effusion/thickening.  Mediastinum/Nodes: Increased size of left hilar and mediastinal lymph nodes. For reference: -left hilar lymph node measures 2.7 x 2.4 cm on image 35/2 previously 2.2 x 1.8 cm -low right paratracheal lymph node measures 12 mm in short axis on image 30/2 previously 7 mm. Lungs/Pleura: Increased size of the left hilar mass which causes central obstruction a left upper lobe bronchus with postobstructive bronchial wall thickening/plugging now measuring 3.3 x 2.4 cm on image 35/2. Increased in the numerous small ground-glass pulmonary nodule seen throughout the left upper lobe and lingula. Right lung is clear. Musculoskeletal: Similar appearance of the mixed lytic and sclerotic osseous metastasis at T8. CT ABDOMEN PELVIS FINDINGS Hepatobiliary: No suspicious hepatic lesion. Gallbladder is unremarkable. No biliary ductal dilation. Pancreas: No pancreatic ductal dilation or evidence of acute inflammation. Spleen: No splenomegaly. Adrenals/Urinary Tract: No suspicious adrenal nodule/mass. No hydronephrosis. Kidneys demonstrate symmetric enhancement. Urinary bladder is unremarkable for degree of distension. Stomach/Bowel: No pathologic dilation of small or large bowel. No evidence of acute bowel inflammation. Vascular/Lymphatic: Normal caliber abdominal aorta. Aortic atherosclerosis. Smooth IVC contours. The portal, splenic and superior mesenteric veins are patent. No pathologically enlarged abdominal or pelvic lymph nodes. Reproductive: Prostate is unremarkable. Other: No significant abdominopelvic free fluid. Musculoskeletal: Similar appearance of the mixed lytic and sclerotic lesion in the L4 vertebral body. IMPRESSION: 1. Increased size of the left hilar mass which causes central obstruction of a left upper lobe bronchus with postobstructive bronchial wall thickening/plugging. 2. Increased size of the left hilar and mediastinal lymph nodes, consistent with worsening nodal metastatic disease. 3. Increased in the numerous  small ground-glass pulmonary nodules seen throughout the left upper lobe and lingula, which are nonspecific and may be infectious/inflammatory or reflect lymphangitic spread of tumor. 4. Similar appearance of the mixed lytic and sclerotic osseous metastasis at T8 and L4. 5. No evidence of metastatic disease in the abdomen or pelvis. 6.  Aortic Atherosclerosis (ICD10-I70.0). Electronically Signed   By: Tama Fails M.D.   On: 07/10/2023 08:31   CT ABDOMEN PELVIS W CONTRAST Result Date: 07/10/2023 CLINICAL DATA:  History of non-small cell lung cancer status post chemo radiation, ongoing immunotherapy. Follow-up. *  Tracking Code: BO * EXAM: CT CHEST, ABDOMEN, AND PELVIS WITH CONTRAST TECHNIQUE: Multidetector CT imaging of the chest, abdomen and pelvis was performed following the standard protocol during bolus administration of intravenous contrast. RADIATION DOSE REDUCTION: This exam was performed according to the departmental dose-optimization program which includes automated exposure control, adjustment of the mA and/or kV according to patient size and/or use of iterative reconstruction technique. CONTRAST:  OMNIPAQUE  IOHEXOL  300 MG/ML  SOLN COMPARISON:  Multiple priors including CT May 11, 2023 FINDINGS: CT CHEST FINDINGS Cardiovascular: New aortic atherosclerosis. Normal size heart. No significant pericardial effusion/thickening. Mediastinum/Nodes: Increased size of left hilar and mediastinal lymph nodes. For reference: -left hilar lymph node measures 2.7 x 2.4 cm on image 35/2 previously 2.2 x 1.8 cm -low right paratracheal lymph node measures 12 mm in short axis on image 30/2 previously 7 mm. Lungs/Pleura: Increased size of the left hilar mass which causes central obstruction a left upper lobe bronchus with postobstructive bronchial wall thickening/plugging now measuring 3.3 x 2.4 cm on image 35/2. Increased in the numerous small ground-glass pulmonary nodule seen throughout the left upper lobe and  lingula. Right lung is clear. Musculoskeletal: Similar appearance of the mixed lytic and sclerotic osseous metastasis at T8. CT ABDOMEN PELVIS FINDINGS Hepatobiliary: No suspicious hepatic lesion. Gallbladder is unremarkable. No biliary ductal dilation. Pancreas: No pancreatic ductal dilation or evidence of acute inflammation. Spleen: No splenomegaly. Adrenals/Urinary Tract: No suspicious adrenal nodule/mass. No hydronephrosis. Kidneys demonstrate symmetric enhancement. Urinary bladder is unremarkable for degree of distension. Stomach/Bowel: No pathologic dilation of small or large bowel. No evidence of acute bowel inflammation. Vascular/Lymphatic: Normal caliber abdominal aorta. Aortic atherosclerosis. Smooth IVC contours. The portal, splenic and superior mesenteric veins are patent. No pathologically enlarged abdominal or pelvic lymph nodes. Reproductive: Prostate is unremarkable. Other: No significant abdominopelvic free fluid. Musculoskeletal: Similar appearance of the mixed lytic and sclerotic lesion in the L4 vertebral body. IMPRESSION: 1. Increased size of the left hilar mass which causes central obstruction of a left upper lobe bronchus with postobstructive bronchial wall thickening/plugging. 2. Increased size of the left hilar and mediastinal lymph nodes, consistent with worsening nodal metastatic disease. 3. Increased in the numerous small ground-glass pulmonary nodules seen throughout the left upper lobe and lingula, which are nonspecific and may be infectious/inflammatory or reflect lymphangitic spread of tumor. 4. Similar appearance of the mixed lytic and sclerotic osseous metastasis at T8 and L4. 5. No evidence of metastatic disease in the abdomen or pelvis. 6.  Aortic Atherosclerosis (ICD10-I70.0). Electronically Signed   By: Tama Fails M.D.   On: 07/10/2023 08:31     ASSESSMENT/PLAN:  This is a very pleasant 68 year old African-American male with stage IV (T2a, N2, M1 B non-small cell lung  cancer, squamous cell carcinoma.  He presented with a left upper lobe lung mass in addition to AP window lymphadenopathy and suspicious for metastases to T8 and L4 vertebrae as well as right retroperitoneal metastatic nodule.  He was diagnosed in April 2024.   His molecular studies by [id number] showed no actionable mutation.  His PD-L1 expression is 70%.   He underwent palliative radiation to the metastatic bone lesions.   He also receives Xgeva  every 6 weeks.  He is due for this at his next infusion  He is underwent palliative systemic chemotherapy and immunotherapy with carboplatin  for AUC of 5, paclitaxel  175 mg/m, and immunotherapy with Libtayo  350 mg IV every 3 weeks with Neulasta  support.  He is status post 4 cycles.  He started maintenance immunotherapy with Libtayo . He is status post 15 cycles of maintenance.   The patient was seen with Dr. Marguerita Shih today.  Dr. Marguerita Shih personally and independently reviewed the scan and discussed results with the patient today.  The scan showed  increased size of the left hilar mass which causes central obstruction of a left upper lobe bronchus, increase in the left hilar and mediastinal lymph nodes, and increased in numerous ground glass pulmonary nodules which are nonspecific.  Dr. Marguerita Shih recommends referral to radiation to discuss palliative radiation to the obstructive lung mass.   Therefore, Dr. Marguerita Shih will hold his current treatment with immunotherapy. Dr. Marguerita Shih will see him back after radiation in around 3 weeks for evaluation and to consider resuming treatment.   He will continue taking his iron supplement for anemia.    Will continue taking his blood thinner.   He will see palliative care today.   The patient was advised to call immediately if he has any concerning symptoms in the interval. The patient voices understanding of current disease status and treatment options and is in agreement with the current care plan. All questions  were answered. The patient knows to call the clinic with any problems, questions or concerns. We can certainly see the patient much sooner if necessary  No orders of the defined types were placed in this encounter.     Breunna Nordmann L Saket Hellstrom, PA-C 07/16/23  ADDENDUM: Hematology/Oncology Attending: I had a face-to-face encounter with the patient today.  I reviewed his record, lab, scan and recommended his care plan.  This is a very pleasant 69 years old African-American male with a stage IV non-small cell lung cancer, squamous cell carcinoma diagnosed in April 2024 with no actionable mutation and PD-L1 expression of 70%.  He is status post palliative radiotherapy to the T8 and L4 metastatic bone disease.  This was followed by palliative systemic chemotherapy with carboplatin , paclitaxel  and Libtayo  (Cempilimab) for 4 cycles followed by maintenance treatment with single agent Libtayo  (Cempilimab) every 3 weeks status post 15 cycles.  The patient also has been on treatment with Xgeva  for metastatic bone disease.  He has been tolerating his treatment with immunotherapy fairly well.  He had repeat CT scan of the chest, abdomen pelvis performed recently.  I personally and independently reviewed the scan images and discussed the results with the patient and showed him the images today.  Unfortunately his scan showed increase in the size of the left hilar mass with central obstruction of the left upper lobe bronchus and postobstructive bronchial wall thickening and plugging in addition to increase in the size of the left hilar and mediastinal lymph nodes consistent with worsening nodal metastatic disease. I recommended for the patient to see Dr. Eloise Hake for consideration of a course of palliative radiotherapy to this area. We will continue to hold his treatment with immunotherapy for now until completion of the radiotherapy option. The patient will come back for follow-up visit in 3 weeks for evaluation  before discussion of resuming his treatment. He was advised to call immediately if he has any other concerning symptoms in the interval. The total time spent in the appointment was 30 minutes including review of chart and various tests results, discussions about plan of care and coordination of care plan . Disclaimer: This note was dictated with voice recognition software. Similar sounding words can inadvertently be transcribed and may be missed upon review. Aurelio Blower, MD

## 2023-07-20 ENCOUNTER — Inpatient Hospital Stay: Admitting: Dietician

## 2023-07-20 ENCOUNTER — Inpatient Hospital Stay

## 2023-07-20 ENCOUNTER — Inpatient Hospital Stay: Attending: Internal Medicine

## 2023-07-20 ENCOUNTER — Encounter: Payer: Self-pay | Admitting: Nurse Practitioner

## 2023-07-20 ENCOUNTER — Inpatient Hospital Stay: Attending: Internal Medicine | Admitting: Physician Assistant

## 2023-07-20 ENCOUNTER — Inpatient Hospital Stay (HOSPITAL_BASED_OUTPATIENT_CLINIC_OR_DEPARTMENT_OTHER): Admitting: Nurse Practitioner

## 2023-07-20 VITALS — BP 138/66 | HR 72 | Temp 98.7°F | Resp 14 | Wt 130.6 lb

## 2023-07-20 DIAGNOSIS — C3412 Malignant neoplasm of upper lobe, left bronchus or lung: Secondary | ICD-10-CM | POA: Diagnosis present

## 2023-07-20 DIAGNOSIS — R530 Neoplastic (malignant) related fatigue: Secondary | ICD-10-CM

## 2023-07-20 DIAGNOSIS — Z87891 Personal history of nicotine dependence: Secondary | ICD-10-CM | POA: Insufficient documentation

## 2023-07-20 DIAGNOSIS — K5903 Drug induced constipation: Secondary | ICD-10-CM | POA: Diagnosis not present

## 2023-07-20 DIAGNOSIS — C778 Secondary and unspecified malignant neoplasm of lymph nodes of multiple regions: Secondary | ICD-10-CM | POA: Diagnosis not present

## 2023-07-20 DIAGNOSIS — C7951 Secondary malignant neoplasm of bone: Secondary | ICD-10-CM | POA: Insufficient documentation

## 2023-07-20 DIAGNOSIS — G893 Neoplasm related pain (acute) (chronic): Secondary | ICD-10-CM | POA: Diagnosis not present

## 2023-07-20 DIAGNOSIS — C3492 Malignant neoplasm of unspecified part of left bronchus or lung: Secondary | ICD-10-CM

## 2023-07-20 DIAGNOSIS — R630 Anorexia: Secondary | ICD-10-CM | POA: Diagnosis not present

## 2023-07-20 DIAGNOSIS — Z79620 Long term (current) use of immunosuppressive biologic: Secondary | ICD-10-CM | POA: Insufficient documentation

## 2023-07-20 DIAGNOSIS — Z515 Encounter for palliative care: Secondary | ICD-10-CM | POA: Diagnosis not present

## 2023-07-20 LAB — CBC WITH DIFFERENTIAL (CANCER CENTER ONLY)
Abs Immature Granulocytes: 0 10*3/uL (ref 0.00–0.07)
Basophils Absolute: 0 10*3/uL (ref 0.0–0.1)
Basophils Relative: 0 %
Eosinophils Absolute: 0 10*3/uL (ref 0.0–0.5)
Eosinophils Relative: 1 %
HCT: 35.7 % — ABNORMAL LOW (ref 39.0–52.0)
Hemoglobin: 11.9 g/dL — ABNORMAL LOW (ref 13.0–17.0)
Immature Granulocytes: 0 %
Lymphocytes Relative: 19 %
Lymphs Abs: 0.8 10*3/uL (ref 0.7–4.0)
MCH: 30.1 pg (ref 26.0–34.0)
MCHC: 33.3 g/dL (ref 30.0–36.0)
MCV: 90.2 fL (ref 80.0–100.0)
Monocytes Absolute: 0.4 10*3/uL (ref 0.1–1.0)
Monocytes Relative: 10 %
Neutro Abs: 3.1 10*3/uL (ref 1.7–7.7)
Neutrophils Relative %: 70 %
Platelet Count: 202 10*3/uL (ref 150–400)
RBC: 3.96 MIL/uL — ABNORMAL LOW (ref 4.22–5.81)
RDW: 12.8 % (ref 11.5–15.5)
WBC Count: 4.3 10*3/uL (ref 4.0–10.5)
nRBC: 0 % (ref 0.0–0.2)

## 2023-07-20 LAB — CMP (CANCER CENTER ONLY)
ALT: 23 U/L (ref 0–44)
AST: 20 U/L (ref 15–41)
Albumin: 4.2 g/dL (ref 3.5–5.0)
Alkaline Phosphatase: 54 U/L (ref 38–126)
Anion gap: 5 (ref 5–15)
BUN: 22 mg/dL (ref 8–23)
CO2: 30 mmol/L (ref 22–32)
Calcium: 9.2 mg/dL (ref 8.9–10.3)
Chloride: 103 mmol/L (ref 98–111)
Creatinine: 1.03 mg/dL (ref 0.61–1.24)
GFR, Estimated: >60 mL/min (ref >60)
Glucose, Bld: 109 mg/dL — ABNORMAL HIGH (ref 70–99)
Potassium: 3.8 mmol/L (ref 3.5–5.1)
Sodium: 138 mmol/L (ref 135–145)
Total Bilirubin: 0.2 mg/dL (ref 0.0–1.2)
Total Protein: 7.4 g/dL (ref 6.5–8.1)

## 2023-07-20 NOTE — Progress Notes (Signed)
 Palliative Medicine [facility]  [phone] [phone]   Name: Albert Hardy. Date: 07/20/2023 MRN: [id number]  DOB: Oct 13, 1955  Patient Care Team: [name], MD as PCP - General (Family Medicine)    INTERVAL HISTORY: Albert Hardy. is a 68 y.o. 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 Palliative ask to see for symptom management and goals of care   SOCIAL HISTORY:     reports that he quit smoking about 11 years ago. His smoking use included cigarettes. He started smoking about 41 years ago. He has a 30 pack-year smoking history. He has never used smokeless tobacco. He reports current alcohol use. He reports current drug use. Frequency: 1.00 time per week. Drug: Marijuana.  ADVANCE DIRECTIVES:  None on file  CODE STATUS: Full code  PAST MEDICAL HISTORY: Past Medical History:  Diagnosis Date   Aortic regurgitation    Aortic stenosis 04/04/2018   Atherosclerosis of native arteries of the extremities with intermittent claudication 09/08/2013   Atherosclerosis of native artery of extremity with intermittent claudication (HCC) 09/08/2013   IMO SNOMED Dx Update Oct 2024     Bilateral impacted cerumen 10/13/2018   Bilateral sensorineural hearing loss 12/14/2018   Cancer (HCC)    lung   Dyspnea    Encounter for antineoplastic chemotherapy 08/18/2022   Encounter for antineoplastic immunotherapy 08/18/2022   Former moderate cigarette smoker (10-19 per day) 05/20/2012   Quit smoking Nov 2013 when hospitalized w/ HTN and chest pain(diagnosed w/ valvular heart disease)   GERD (gastroesophageal reflux disease)    Heart murmur    History of radiation therapy    Lumbar Spine, Thoracic Spine- 06/25/22-07/10/22- Dr. 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  MEDICATIONS:  Current Outpatient Medications  Medication Sig Dispense Refill   albuterol  (VENTOLIN  HFA) 108 (90 Base) MCG/ACT inhaler Inhale 2 puffs into the lungs every 6 (six) hours as needed for wheezing or shortness of breath. 8 g 6   Ascorbic Acid (VITAMIN C PO) Take 1 tablet by mouth daily.     Cholecalciferol (VITAMIN D3) 1000 UNITS CAPS Take 1,000 Units by mouth daily.     Coenzyme Q10 (CO Q 10 PO) Take 1 tablet by mouth daily.     Cyanocobalamin (VITAMIN B-12 PO) Take 1 tablet by mouth daily.     cyclobenzaprine (FLEXERIL) 5 MG tablet Take 1 tablet (5 mg total) by mouth 3 (three) times daily as needed for muscle spasms. 30 tablet 0   ELIQUIS  5 MG TABS tablet Take 1 tablet by mouth twice daily 60 tablet 0   ezetimibe  (ZETIA ) 10 MG tablet Take 1 tablet (10 mg total) by mouth daily. 90 tablet 3   fluticasone  (FLONASE ) 50 MCG/ACT nasal spray Place 1-2 sprays into both nostrils daily. 16 g 6   KRILL OIL PO Take 1 tablet by mouth daily.     levofloxacin  (LEVAQUIN ) 500 MG tablet Take 1 tablet (500 mg total) by mouth daily. 7 tablet 0   lisinopril -hydrochlorothiazide  (ZESTORETIC ) 10-12.5 MG tablet Take 1 tablet by  mouth once daily 90 tablet 0   MAGNESIUM PO Take 1 tablet by mouth daily.     metoCLOPramide  (REGLAN ) 10 MG tablet Take 1 tablet (10 mg total) by mouth 4 (four) times daily -  before meals and at bedtime. 56 tablet 0   metoprolol  tartrate (LOPRESSOR ) 25 MG tablet Take 1 tablet (25 mg total) by mouth once for 1 dose. Please take this medication 2 hours before CT 1 tablet 0   morphine  (MS CONTIN ) 15 MG 12 hr tablet Take 1 tablet (15 mg total) by mouth every 12 (twelve) hours. 60 tablet 0   Multiple Vitamins-Minerals (CENTRUM SILVER PO) Take 1 tablet  by mouth daily.     Multiple Vitamins-Minerals (ZINC PO) Take 1 tablet by mouth daily.     Naphazoline-Pheniramine (OPCON-A) 0.027-0.315 % SOLN Place 1 drop into both eyes daily as needed (redness).     nitroGLYCERIN  (NITROSTAT ) 0.4 MG SL tablet Place 1 tablet (0.4 mg total) under the tongue every 5 (five) minutes as needed for chest pain. 25 tablet 1   omeprazole  (PRILOSEC) 20 MG capsule Take 1 capsule by mouth once daily 30 capsule 0   omeprazole  (PRILOSEC) 20 MG capsule Take 1 capsule (20 mg total) by mouth daily. 30 capsule 0   oxyCODONE  (OXY IR/ROXICODONE ) 5 MG immediate release tablet Take 1 tablet (5 mg total) by mouth every 4 (four) hours as needed for severe pain (pain score 7-10). 90 tablet 0   potassium chloride  SA (KLOR-CON  M) 20 MEQ tablet Take 1 tablet (20 mEq total) by mouth daily. 6 tablet 0   prochlorperazine  (COMPAZINE ) 10 MG tablet Take 1 tablet (10 mg total) by mouth every 6 (six) hours as needed for nausea or vomiting. 30 tablet 0   rosuvastatin  (CRESTOR ) 5 MG tablet Take 1 tablet (5 mg total) by mouth at bedtime. 90 tablet 1   triamcinolone  0.1%-Eucerin equivalent 1:1 cream mixture Apply topically 3 (three) times daily as needed. 480 g 2   TURMERIC PO Take 1 tablet by mouth 3 (three) times a week.     umeclidinium-vilanterol (ANORO ELLIPTA ) 62.5-25 MCG/ACT AEPB Inhale 1 puff into the lungs daily. 180 each 3   No current facility-administered medications for this visit.    VITAL SIGNS: There were no vitals taken for this visit. There were no vitals filed for this visit.  Estimated body mass index is 19.29 kg/m as calculated from the following:   Height as of 06/29/23: 5\' 9"  (1.753 m).   Weight as of an earlier encounter on 07/20/23: 130 lb 9.6 oz (59.2 kg).     Latest Ref Rng & Units 07/20/2023    2:42 PM 06/29/2023   10:13 AM 06/08/2023   10:07 AM  CBC  WBC 4.0 - 10.5 K/uL 4.3  3.6  4.1   Hemoglobin 13.0 - 17.0 g/dL 32.3  55.7  32.2   Hematocrit 39.0 - 52.0 % 35.7   35.4  34.4   Platelets 150 - 400 K/uL 202  188  204        Latest Ref Rng & Units 07/20/2023    2:42 PM 06/29/2023   10:13 AM 06/08/2023   10:07 AM  CMP  Glucose 70 - 99 mg/dL 025  99  427   BUN 8 - 23 mg/dL 22  30  18    Creatinine 0.61 - 1.24 mg/dL 0.62  3.76  2.83   Sodium 135 - 145 mmol/L 138  135  139   Potassium 3.5 - 5.1  mmol/L 3.8  4.2  3.7   Chloride 98 - 111 mmol/L 103  101  103   CO2 22 - 32 mmol/L 30  28  27    Calcium  8.9 - 10.3 mg/dL 9.2  9.3  9.1   Total Protein 6.5 - 8.1 g/dL 7.4  7.3  7.3   Total Bilirubin 0.0 - 1.2 mg/dL 0.2  0.3  0.3   Alkaline Phos 38 - 126 U/L 54  51  49   AST 15 - 41 U/L 20  18  22    ALT 0 - 44 U/L 23  21  26      PERFORMANCE STATUS (ECOG) : 1 - Symptomatic but completely ambulatory   Physical Exam General: NAD Cardiovascular: RRR Pulmonary: normal breathing pattern  Extremities: no edema, no joint deformities Skin: no rashes Neurological: AAO x4  IMPRESSION:  Albert Hardy. Presents to clinic for symptom management follow-up. No acute distress noted. Patient somewhat distress after long wait today. His treatment is being held today after seeing his Oncology team. Patient is emotional expressing understanding of today's report that his recent scans showed some disease progression and plan is for referral to radiation oncology for potential palliative radiation in the setting of obstructive lung mass.   Albert Hardy expresses his disappointment in today's report however acknowledging his awareness that progression was to be expected at some point along his oncology journey. Emotional support provided. He wishes to continue to treat the treatable allowing him every opportunity to thrive and stabilize.   Continues to have some challenges with weight loss or inability to gain weight. Appetite reported as slightly decreased compared to previous weeks. His pain is overall well controlled on current regimen. At times sharp pains however manageable  at this time. Some dyspnea noted with exertion. No adjustments to regimen at this time.   We will continue to closely follow and support. All questions answered and support provided.  Goals of Care  06/18/22-  We discussed his current illness and what it means in the larger context of hsi on-going co-morbidities. Natural disease trajectory and expectations were discussed.  Albert Hardy and his wife are realistic in their expectations and understanding of his incurable cancer. He knows all treatment is palliative focused. Wishes to continue taking things one day at a time focusing on his quality of life allowing him every opportunity to continue to thrive.   We discussed Her current illness and what it means in the larger context of Her on-going co-morbidities. Natural disease trajectory and expectations were discussed.  I discussed the importance of continued conversation with family and their medical providers regarding overall plan of care and treatment options, ensuring decisions are within the context of the patients values and GOCs.  Assessment and Plan  Chronic Cancer Related Pain Management Patient reports pain is controlled overall. Some days are better than others. No adjustments at this time.  -Continue current regimen of MS Contin  and oxycodone  as needed. -Will continue to closely follow and adjust regimen as needed.  Constipation No current issues. Patient has been taking Senna-S 2 caps daily. -Continue current regimen.  I will plan to follow-up with patient in 4-6 weeks. Sooner if needed.  Patient expressed understanding and was in agreement with this plan. He also understands that He can call the clinic at any time with any questions, concerns, or complaints.   Any controlled substances utilized were prescribed in the context of palliative care. PDMP has been reviewed.   Visit consisted  of counseling and education dealing with the complex and emotionally intense issues of  symptom management and palliative care in the setting of serious and potentially life-threatening illness.  Dellia Ferguson, AGPCNP-BC  Palliative Medicine Team/[facility]

## 2023-07-21 LAB — TSH: TSH: 0.767 u[IU]/mL (ref 0.350–4.500)

## 2023-07-21 LAB — T4: T4, Total: 8.6 ug/dL (ref 4.5–12.0)

## 2023-07-21 NOTE — Progress Notes (Signed)
 Location of tumor and Histology per Pathology Report: left hilar   Biopsy: None  Past/Anticipated interventions by surgeon, if any:  No  Past/Anticipated interventions by medical oncology, if any:      Pain issues, if any:  no    SAFETY ISSUES: Prior radiation? yes Pacemaker/ICD? no Possible current pregnancy? no Is the patient on methotrexate? no  Current Complaints / other details:

## 2023-07-21 NOTE — Progress Notes (Signed)
 Radiation Oncology         [phone] ________________________________  Outpatient Re-Consultation Note  Name: Albert Hardy. MRN: [id number]  Date: 07/22/2023  DOB: December 28, 1955  WN:[id number], Delpha Fickle, MD  Marlene Simas, MD   REFERRING PHYSICIAN: Marlene Simas, MD  DIAGNOSIS: The encounter diagnosis was Malignant neoplasm of upper lobe bronchus, left (HCC).   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 is a 68 y.o. male current smoker who is seen as a courtesy of Dr. Liam Redhead for an opinion concerning radiation therapy as part of management for his lung cancer. Patient has a history if non-small cell lung cancer diagnosed in 05/2022 with metastatic bone disease. He is well known to us  as we treated his T8 and L4 vertebrae in May of 2024.   Patient was initially treated with palliative systemic chemotherapy with carboplatin , paclitaxel  and Libtayo  (Cempilimab) for 4 cycles followed by maintenance treatment with single agent Libtayo  (Cempilimab) every 3 weeks status post 15 cycles. The patient also has been on treatment with Xgeva  for metastatic bone disease. He has been tolerating his treatment with immunotherapy fairly well.   Most recently, he had repeat CT scan of the chest, abdomen pelvis performed on 07/09/23 showing an increase in the size of the left hilar mass with central obstruction of the left upper lobe bronchus and postobstructive bronchial wall thickening and plugging in addition to increase in the size of the left hilar and mediastinal  lymph. Subsequently, he was referred here for consideration of radiation to the enlarging left hilar mass.  Patient notes some worsening shortness of breath. He denies any pain, cough, use of oxygen, or any other changes to his breathing.   PREVIOUS RADIATION THERAPY: Yes  Indication for treatment: palliative       Radiation treatment dates: 06/25/22 through 07/10/22 Site/dose:  1) lumbar spine - 30 Gy delivered in 10 Fx at 3 Gy/Fx 2) thoracic spine - 30 Gy delivered in 10 Fx at 3 Gy/Fx Beams/energy: 15X, 10X Technique/Mode: 3D / Photon  PAST MEDICAL HISTORY:  Past Medical History:  Diagnosis Date   Aortic regurgitation    Aortic stenosis 04/04/2018   Atherosclerosis of native arteries of the extremities with intermittent claudication 09/08/2013   Atherosclerosis of native artery of extremity with intermittent claudication (HCC) 09/08/2013   IMO SNOMED Dx Update Oct 2024     Bilateral impacted cerumen 10/13/2018   Bilateral sensorineural hearing loss 12/14/2018   Cancer (HCC)    lung   Dyspnea    Encounter for antineoplastic chemotherapy 08/18/2022   Encounter for antineoplastic immunotherapy 08/18/2022   Former moderate cigarette smoker (10-19 per day) 05/20/2012   Quit smoking Nov 2013 when hospitalized w/ HTN and chest pain(diagnosed w/ valvular heart disease)   GERD (gastroesophageal reflux disease)    Heart murmur    History of radiation therapy    Lumbar Spine, Thoracic Spine- 06/25/22-07/10/22- Dr. 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   Substance abuse (HCC)     PAST SURGICAL HISTORY: Past Surgical History:  Procedure Laterality Date   BRONCHIAL BIOPSY  05/26/2022   Procedure: BRONCHIAL BIOPSIES;  Surgeon: Prudy Brownie, DO;  Location: [facility];  Service: Pulmonary;;   BRONCHIAL BRUSHINGS  05/26/2022   Procedure: BRONCHIAL BRUSHINGS;  Surgeon: Prudy Brownie, DO;  Location: [facility];  Service: Pulmonary;;   NO PAST SURGERIES     VIDEO BRONCHOSCOPY  05/26/2022   Procedure: VIDEO BRONCHOSCOPY WITHOUT FLUORO;  Surgeon: Prudy Brownie, DO;  Location: [facility];  Service: Pulmonary;;    FAMILY HISTORY:  Family History  Problem Relation Age of Onset   Other Brother    Hypertension Brother    Hyperlipidemia Brother    Arthritis Mother    Hypertension Mother    Hyperlipidemia Mother    Colon cancer Mother        36's   Hypertension Sister    Hyperlipidemia Sister    Diabetes Sister    Heart attack Father    Hyperlipidemia Brother     SOCIAL HISTORY:  Social History   Tobacco Use   Smoking status: Former    Current packs/day: 0.00    Average packs/day: 1 pack/day for 30.0 years (30.0 ttl pk-yrs)    Types: Cigarettes    Start date: 12/20/1981    Quit date: 12/21/2011    Years since quitting: 11.5   Smokeless tobacco: Never  Vaping Use   Vaping status: Every Day  Substance Use Topics   Alcohol use: Yes    Comment: Ocassionaly.    Drug use: Yes    Frequency: 1.0 times per week    Types: Marijuana   Patient has a 41 year smoking history with a 30 pack per year. He also reports current Marijuana and alcohol use.   ALLERGIES:  Allergies  Allergen Reactions   Lipitor [Atorvastatin]     myalgia   Pravastatin Other (See Comments)    myalgia    MEDICATIONS:  Current Outpatient Medications  Medication Sig Dispense Refill   albuterol  (VENTOLIN  HFA) 108 (90 Base) MCG/ACT inhaler Inhale 2 puffs into the lungs every 6 (six) hours as needed for wheezing or shortness of breath. 8 g 6   Ascorbic Acid (VITAMIN C PO) Take 1 tablet by mouth daily.     Cholecalciferol (VITAMIN D3) 1000 UNITS CAPS Take 1,000 Units by mouth daily.     Coenzyme Q10 (CO Q 10 PO) Take 1 tablet by  mouth daily.     Cyanocobalamin (VITAMIN B-12 PO) Take 1 tablet by mouth daily.     cyclobenzaprine (FLEXERIL) 5 MG tablet Take 1 tablet (5 mg total) by mouth 3 (three) times daily as needed for muscle spasms. 30 tablet 0   ELIQUIS  5 MG TABS tablet Take 1 tablet by mouth twice daily 60 tablet 0   ezetimibe  (ZETIA ) 10 MG tablet Take 1 tablet (10 mg total) by mouth daily. 90 tablet 3   fluticasone  (FLONASE ) 50 MCG/ACT nasal spray Place 1-2 sprays into both nostrils daily. 16 g 6   KRILL OIL PO Take 1 tablet by mouth daily.     levofloxacin  (LEVAQUIN ) 500 MG tablet Take 1 tablet (500 mg total) by mouth daily. 7 tablet 0   lisinopril -hydrochlorothiazide  (ZESTORETIC ) 10-12.5 MG tablet Take 1 tablet by mouth once daily 90 tablet  0   MAGNESIUM PO Take 1 tablet by mouth daily.     metoCLOPramide  (REGLAN ) 10 MG tablet Take 1 tablet (10 mg total) by mouth 4 (four) times daily -  before meals and at bedtime. 56 tablet 0   metoprolol  tartrate (LOPRESSOR ) 25 MG tablet Take 1 tablet (25 mg total) by mouth once for 1 dose. Please take this medication 2 hours before CT 1 tablet 0   morphine  (MS CONTIN ) 15 MG 12 hr tablet Take 1 tablet (15 mg total) by mouth every 12 (twelve) hours. 60 tablet 0   Multiple Vitamins-Minerals (CENTRUM SILVER PO) Take 1 tablet by mouth daily.     Multiple Vitamins-Minerals (ZINC PO) Take 1 tablet by mouth daily.     Naphazoline-Pheniramine (OPCON-A) 0.027-0.315 % SOLN Place 1 drop into both eyes daily as needed (redness).     nitroGLYCERIN  (NITROSTAT ) 0.4 MG SL tablet Place 1 tablet (0.4 mg total) under the tongue every 5 (five) minutes as needed for chest pain. 25 tablet 1   omeprazole  (PRILOSEC) 20 MG capsule Take 1 capsule by mouth once daily 30 capsule 0   omeprazole  (PRILOSEC) 20 MG capsule Take 1 capsule (20 mg total) by mouth daily. 30 capsule 0   oxyCODONE  (OXY IR/ROXICODONE ) 5 MG immediate release tablet Take 1 tablet (5 mg total) by mouth every 4 (four) hours as needed for  severe pain (pain score 7-10). 90 tablet 0   potassium chloride  SA (KLOR-CON  M) 20 MEQ tablet Take 1 tablet (20 mEq total) by mouth daily. 6 tablet 0   prochlorperazine  (COMPAZINE ) 10 MG tablet Take 1 tablet (10 mg total) by mouth every 6 (six) hours as needed for nausea or vomiting. 30 tablet 0   rosuvastatin  (CRESTOR ) 5 MG tablet Take 1 tablet (5 mg total) by mouth at bedtime. 90 tablet 1   triamcinolone  0.1%-Eucerin equivalent 1:1 cream mixture Apply topically 3 (three) times daily as needed. 480 g 2   TURMERIC PO Take 1 tablet by mouth 3 (three) times a week.     umeclidinium-vilanterol (ANORO ELLIPTA ) 62.5-25 MCG/ACT AEPB Inhale 1 puff into the lungs daily. 180 each 3   No current facility-administered medications for this encounter.    REVIEW OF SYSTEMS: Notable for that above.    PHYSICAL EXAM:  height is 5' 9 (1.753 m) and weight is 129 lb 12.8 oz (58.9 kg). His temperature is 97.5 F (36.4 C) (abnormal). His blood pressure is 116/65 and his pulse is 62. His respiration is 19 and oxygen saturation is 100%.   General: Alert and oriented, in no acute distress HEENT: Head is normocephalic. Extraocular movements are intact.  Neck: Neck is supple, no palpable cervical or supraclavicular lymphadenopathy. Heart: Regular in rate and rhythm with no murmurs, rubs, or gallops. Chest: Clear to auscultation bilaterally, with no rhonchi, wheezes, or rales. Abdomen: Soft, nontender, nondistended, with no rigidity or guarding. Extremities: No cyanosis or edema. Lymphatics: see Neck Exam Skin: No concerning lesions. Musculoskeletal: symmetric strength and muscle tone throughout. Neurologic: Cranial nerves II through XII are grossly intact. No obvious focalities. Speech is fluent. Coordination is intact. Psychiatric: Judgment and insight are intact. Affect is appropriate.   ECOG = 1  0 - Asymptomatic (Fully active, able to carry on all predisease activities without restriction)  1 -  Symptomatic but completely ambulatory (Restricted in physically strenuous activity but ambulatory and able to carry out work of a light or sedentary nature. For example, light housework, office work)  2 - Symptomatic, <  50% in bed during the day (Ambulatory and capable of all self care but unable to carry out any work activities. Up and about more than 50% of waking hours)  3 - Symptomatic, >50% in bed, but not bedbound (Capable of only limited self-care, confined to bed or chair 50% or more of waking hours)  4 - Bedbound (Completely disabled. Cannot carry on any self-care. Totally confined to bed or chair)  5 - Death   Aurea Blossom MM, Creech RH, Tormey DC, et al. [phone]). Toxicity and response criteria of the [facility]. Am. Hillard Lowes. Oncol. 5 (6): 649-55  LABORATORY DATA:  Lab Results  Component Value Date   WBC 4.3 07/20/2023   HGB 11.9 (L) 07/20/2023   HCT 35.7 (L) 07/20/2023   MCV 90.2 07/20/2023   PLT 202 07/20/2023   NEUTROABS 3.1 07/20/2023   Lab Results  Component Value Date   NA 138 07/20/2023   K 3.8 07/20/2023   CL 103 07/20/2023   CO2 30 07/20/2023   GLUCOSE 109 (H) 07/20/2023   BUN 22 07/20/2023   CREATININE 1.03 07/20/2023   CALCIUM  9.2 07/20/2023      RADIOGRAPHY: CT Chest W Contrast Result Date: 07/10/2023 CLINICAL DATA:  History of non-small cell lung cancer status post chemo radiation, ongoing immunotherapy. Follow-up. * Tracking Code: BO * EXAM: CT CHEST, ABDOMEN, AND PELVIS WITH CONTRAST TECHNIQUE: Multidetector CT imaging of the chest, abdomen and pelvis was performed following the standard protocol during bolus administration of intravenous contrast. RADIATION DOSE REDUCTION: This exam was performed according to the departmental dose-optimization program which includes automated exposure control, adjustment of the mA and/or kV according to patient size and/or use of iterative reconstruction technique. CONTRAST:  OMNIPAQUE  IOHEXOL  300  MG/ML  SOLN COMPARISON:  Multiple priors including CT May 11, 2023 FINDINGS: CT CHEST FINDINGS Cardiovascular: New aortic atherosclerosis. Normal size heart. No significant pericardial effusion/thickening. Mediastinum/Nodes: Increased size of left hilar and mediastinal lymph nodes. For reference: -left hilar lymph node measures 2.7 x 2.4 cm on image 35/2 previously 2.2 x 1.8 cm -low right paratracheal lymph node measures 12 mm in short axis on image 30/2 previously 7 mm. Lungs/Pleura: Increased size of the left hilar mass which causes central obstruction a left upper lobe bronchus with postobstructive bronchial wall thickening/plugging now measuring 3.3 x 2.4 cm on image 35/2. Increased in the numerous small ground-glass pulmonary nodule seen throughout the left upper lobe and lingula. Right lung is clear. Musculoskeletal: Similar appearance of the mixed lytic and sclerotic osseous metastasis at T8. CT ABDOMEN PELVIS FINDINGS Hepatobiliary: No suspicious hepatic lesion. Gallbladder is unremarkable. No biliary ductal dilation. Pancreas: No pancreatic ductal dilation or evidence of acute inflammation. Spleen: No splenomegaly. Adrenals/Urinary Tract: No suspicious adrenal nodule/mass. No hydronephrosis. Kidneys demonstrate symmetric enhancement. Urinary bladder is unremarkable for degree of distension. Stomach/Bowel: No pathologic dilation of small or large bowel. No evidence of acute bowel inflammation. Vascular/Lymphatic: Normal caliber abdominal aorta. Aortic atherosclerosis. Smooth IVC contours. The portal, splenic and superior mesenteric veins are patent. No pathologically enlarged abdominal or pelvic lymph nodes. Reproductive: Prostate is unremarkable. Other: No significant abdominopelvic free fluid. Musculoskeletal: Similar appearance of the mixed lytic and sclerotic lesion in the L4 vertebral body. IMPRESSION: 1. Increased size of the left hilar mass which causes central obstruction of a left upper lobe  bronchus with postobstructive bronchial wall thickening/plugging. 2. Increased size of the left hilar and mediastinal lymph nodes, consistent with worsening nodal metastatic disease. 3. Increased in the  numerous small ground-glass pulmonary nodules seen throughout the left upper lobe and lingula, which are nonspecific and may be infectious/inflammatory or reflect lymphangitic spread of tumor. 4. Similar appearance of the mixed lytic and sclerotic osseous metastasis at T8 and L4. 5. No evidence of metastatic disease in the abdomen or pelvis. 6.  Aortic Atherosclerosis (ICD10-I70.0). Electronically Signed   By: Tama Fails M.D.   On: 07/10/2023 08:31   CT ABDOMEN PELVIS W CONTRAST Result Date: 07/10/2023 CLINICAL DATA:  History of non-small cell lung cancer status post chemo radiation, ongoing immunotherapy. Follow-up. * Tracking Code: BO * EXAM: CT CHEST, ABDOMEN, AND PELVIS WITH CONTRAST TECHNIQUE: Multidetector CT imaging of the chest, abdomen and pelvis was performed following the standard protocol during bolus administration of intravenous contrast. RADIATION DOSE REDUCTION: This exam was performed according to the departmental dose-optimization program which includes automated exposure control, adjustment of the mA and/or kV according to patient size and/or use of iterative reconstruction technique. CONTRAST:  OMNIPAQUE  IOHEXOL  300 MG/ML  SOLN COMPARISON:  Multiple priors including CT May 11, 2023 FINDINGS: CT CHEST FINDINGS Cardiovascular: New aortic atherosclerosis. Normal size heart. No significant pericardial effusion/thickening. Mediastinum/Nodes: Increased size of left hilar and mediastinal lymph nodes. For reference: -left hilar lymph node measures 2.7 x 2.4 cm on image 35/2 previously 2.2 x 1.8 cm -low right paratracheal lymph node measures 12 mm in short axis on image 30/2 previously 7 mm. Lungs/Pleura: Increased size of the left hilar mass which causes central obstruction a left upper lobe  bronchus with postobstructive bronchial wall thickening/plugging now measuring 3.3 x 2.4 cm on image 35/2. Increased in the numerous small ground-glass pulmonary nodule seen throughout the left upper lobe and lingula. Right lung is clear. Musculoskeletal: Similar appearance of the mixed lytic and sclerotic osseous metastasis at T8. CT ABDOMEN PELVIS FINDINGS Hepatobiliary: No suspicious hepatic lesion. Gallbladder is unremarkable. No biliary ductal dilation. Pancreas: No pancreatic ductal dilation or evidence of acute inflammation. Spleen: No splenomegaly. Adrenals/Urinary Tract: No suspicious adrenal nodule/mass. No hydronephrosis. Kidneys demonstrate symmetric enhancement. Urinary bladder is unremarkable for degree of distension. Stomach/Bowel: No pathologic dilation of small or large bowel. No evidence of acute bowel inflammation. Vascular/Lymphatic: Normal caliber abdominal aorta. Aortic atherosclerosis. Smooth IVC contours. The portal, splenic and superior mesenteric veins are patent. No pathologically enlarged abdominal or pelvic lymph nodes. Reproductive: Prostate is unremarkable. Other: No significant abdominopelvic free fluid. Musculoskeletal: Similar appearance of the mixed lytic and sclerotic lesion in the L4 vertebral body. IMPRESSION: 1. Increased size of the left hilar mass which causes central obstruction of a left upper lobe bronchus with postobstructive bronchial wall thickening/plugging. 2. Increased size of the left hilar and mediastinal lymph nodes, consistent with worsening nodal metastatic disease. 3. Increased in the numerous small ground-glass pulmonary nodules seen throughout the left upper lobe and lingula, which are nonspecific and may be infectious/inflammatory or reflect lymphangitic spread of tumor. 4. Similar appearance of the mixed lytic and sclerotic osseous metastasis at T8 and L4. 5. No evidence of metastatic disease in the abdomen or pelvis. 6.  Aortic Atherosclerosis  (ICD10-I70.0). Electronically Signed   By: Tama Fails M.D.   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 We reviewed his current work-up. He presents today with an enlarging left hilar mass which appears to be causing central obstruction of LUL bronchus. Patient is unfortunately experiencing worsening shortness of breath. Dr. Eloise Hake has personally reviewed his previous treatment plan and recommends palliative radiation to the left hilar mass.   Today, I talked to the patient and family about the findings and work-up thus far.  We discussed the natural history of stage IV lung cancer and general treatment, highlighting the role of radiotherapy in the management.  We discussed the available radiation techniques, and focused on the details of logistics and delivery.  We reviewed the anticipated acute and late sequelae associated with radiation in this setting.  The patient was encouraged to ask questions that I answered to the best of my ability.  A patient consent form was discussed and signed.  We retained a copy for our records.  The patient would like to proceed with radiation and will be scheduled for CT simulation.  PLAN: Patient is scheduled for CT simulation on 07/26/2023. Anticipate 30 Gy in 10 fractions to the left hilar mass. We look forward to participating in this patient's care.    30 minutes of total time was spent for this patient encounter, including preparation, face-to-face counseling with the  patient and coordination of care, physical exam, and documentation of the encounter.   ------------------------------------------------   Julio Ohm, PA-C   Noralee Beam, PhD, MD   [facility]  Radiation Oncology Direct Dial: [phone]  Fax: [phone] [email or web address]    This document serves as a record of services personally performed by Retta Caster, MD and Julio Ohm, PA-C. It was created on his behalf by Lucky Sable, a trained medical scribe. The creation of this record is based on the scribe's personal observations and the provider's statements to them. This document has been checked and approved by the attending provider.

## 2023-07-22 ENCOUNTER — Ambulatory Visit
Admission: RE | Admit: 2023-07-22 | Discharge: 2023-07-22 | Disposition: A | Discharge status: Home / Self Care | Source: Ambulatory Visit | Attending: Radiation Oncology | Admitting: Radiation Oncology

## 2023-07-22 ENCOUNTER — Encounter: Payer: Self-pay | Admitting: Radiation Oncology

## 2023-07-22 VITALS — BP 116/65 | HR 62 | Temp 97.5°F | Resp 19 | Ht 69.0 in | Wt 129.8 lb

## 2023-07-22 DIAGNOSIS — K219 Gastro-esophageal reflux disease without esophagitis: Secondary | ICD-10-CM | POA: Insufficient documentation

## 2023-07-22 DIAGNOSIS — I700 Atherosclerosis of aorta: Secondary | ICD-10-CM | POA: Diagnosis not present

## 2023-07-22 DIAGNOSIS — C3412 Malignant neoplasm of upper lobe, left bronchus or lung: Secondary | ICD-10-CM

## 2023-07-22 DIAGNOSIS — Z7901 Long term (current) use of anticoagulants: Secondary | ICD-10-CM | POA: Insufficient documentation

## 2023-07-22 DIAGNOSIS — J431 Panlobular emphysema: Secondary | ICD-10-CM | POA: Insufficient documentation

## 2023-07-22 DIAGNOSIS — E785 Hyperlipidemia, unspecified: Secondary | ICD-10-CM | POA: Insufficient documentation

## 2023-07-22 DIAGNOSIS — I350 Nonrheumatic aortic (valve) stenosis: Secondary | ICD-10-CM | POA: Insufficient documentation

## 2023-07-22 DIAGNOSIS — Z800 Family history of malignant neoplasm of digestive organs: Secondary | ICD-10-CM | POA: Diagnosis not present

## 2023-07-22 DIAGNOSIS — Z801 Family history of malignant neoplasm of trachea, bronchus and lung: Secondary | ICD-10-CM | POA: Insufficient documentation

## 2023-07-22 DIAGNOSIS — I739 Peripheral vascular disease, unspecified: Secondary | ICD-10-CM | POA: Diagnosis not present

## 2023-07-22 DIAGNOSIS — Z923 Personal history of irradiation: Secondary | ICD-10-CM | POA: Insufficient documentation

## 2023-07-22 DIAGNOSIS — Z9221 Personal history of antineoplastic chemotherapy: Secondary | ICD-10-CM | POA: Insufficient documentation

## 2023-07-22 DIAGNOSIS — C7951 Secondary malignant neoplasm of bone: Secondary | ICD-10-CM | POA: Insufficient documentation

## 2023-07-22 DIAGNOSIS — F17210 Nicotine dependence, cigarettes, uncomplicated: Secondary | ICD-10-CM | POA: Insufficient documentation

## 2023-07-22 DIAGNOSIS — Z79899 Other long term (current) drug therapy: Secondary | ICD-10-CM | POA: Insufficient documentation

## 2023-07-22 DIAGNOSIS — R0602 Shortness of breath: Secondary | ICD-10-CM | POA: Diagnosis not present

## 2023-07-22 DIAGNOSIS — I10 Essential (primary) hypertension: Secondary | ICD-10-CM | POA: Diagnosis not present

## 2023-07-22 DIAGNOSIS — R011 Cardiac murmur, unspecified: Secondary | ICD-10-CM | POA: Insufficient documentation

## 2023-07-22 DIAGNOSIS — C3492 Malignant neoplasm of unspecified part of left bronchus or lung: Secondary | ICD-10-CM

## 2023-07-26 ENCOUNTER — Ambulatory Visit
Admission: RE | Admit: 2023-07-26 | Discharge: 2023-07-26 | Disposition: A | Discharge status: Home / Self Care | Source: Ambulatory Visit | Attending: Radiation Oncology | Admitting: Radiation Oncology

## 2023-07-26 ENCOUNTER — Encounter: Payer: Self-pay | Admitting: Internal Medicine

## 2023-07-26 ENCOUNTER — Other Ambulatory Visit: Payer: Self-pay | Admitting: Physician Assistant

## 2023-07-26 DIAGNOSIS — C7951 Secondary malignant neoplasm of bone: Secondary | ICD-10-CM | POA: Insufficient documentation

## 2023-07-26 DIAGNOSIS — C3492 Malignant neoplasm of unspecified part of left bronchus or lung: Secondary | ICD-10-CM

## 2023-07-26 DIAGNOSIS — C3412 Malignant neoplasm of upper lobe, left bronchus or lung: Secondary | ICD-10-CM | POA: Insufficient documentation

## 2023-07-26 DIAGNOSIS — Z510 Encounter for antineoplastic radiation therapy: Secondary | ICD-10-CM | POA: Insufficient documentation

## 2023-07-26 DIAGNOSIS — Z87891 Personal history of nicotine dependence: Secondary | ICD-10-CM | POA: Diagnosis not present

## 2023-07-27 DIAGNOSIS — Z510 Encounter for antineoplastic radiation therapy: Secondary | ICD-10-CM | POA: Diagnosis not present

## 2023-07-29 ENCOUNTER — Ambulatory Visit
Admission: RE | Admit: 2023-07-29 | Discharge: 2023-07-29 | Disposition: A | Discharge status: Home / Self Care | Source: Ambulatory Visit | Attending: Radiation Oncology | Admitting: Radiation Oncology

## 2023-07-29 ENCOUNTER — Other Ambulatory Visit: Payer: Self-pay

## 2023-07-29 DIAGNOSIS — C3492 Malignant neoplasm of unspecified part of left bronchus or lung: Secondary | ICD-10-CM

## 2023-07-29 DIAGNOSIS — Z510 Encounter for antineoplastic radiation therapy: Secondary | ICD-10-CM | POA: Diagnosis not present

## 2023-07-29 LAB — RAD ONC ARIA SESSION SUMMARY
Course Elapsed Days: 0
Plan Fractions Treated to Date: 1
Plan Prescribed Dose Per Fraction: 3 Gy
Plan Total Fractions Prescribed: 10
Plan Total Prescribed Dose: 30 Gy
Reference Point Dosage Given to Date: 3 Gy
Reference Point Session Dosage Given: 3 Gy
Session Number: 1

## 2023-07-30 ENCOUNTER — Other Ambulatory Visit: Payer: Self-pay

## 2023-07-30 ENCOUNTER — Ambulatory Visit
Admission: RE | Admit: 2023-07-30 | Discharge: 2023-07-30 | Disposition: A | Discharge status: Home / Self Care | Source: Ambulatory Visit | Attending: Radiation Oncology | Admitting: Radiation Oncology

## 2023-07-30 DIAGNOSIS — Z510 Encounter for antineoplastic radiation therapy: Secondary | ICD-10-CM | POA: Diagnosis not present

## 2023-07-30 LAB — RAD ONC ARIA SESSION SUMMARY
Course Elapsed Days: 1
Plan Fractions Treated to Date: 2
Plan Prescribed Dose Per Fraction: 3 Gy
Plan Total Fractions Prescribed: 10
Plan Total Prescribed Dose: 30 Gy
Reference Point Dosage Given to Date: 6 Gy
Reference Point Session Dosage Given: 3 Gy
Session Number: 2

## 2023-08-02 ENCOUNTER — Ambulatory Visit
Admission: RE | Admit: 2023-08-02 | Discharge: 2023-08-02 | Disposition: A | Discharge status: Home / Self Care | Source: Ambulatory Visit | Attending: Radiation Oncology | Admitting: Radiation Oncology

## 2023-08-02 ENCOUNTER — Other Ambulatory Visit: Payer: Self-pay

## 2023-08-02 DIAGNOSIS — Z510 Encounter for antineoplastic radiation therapy: Secondary | ICD-10-CM | POA: Diagnosis not present

## 2023-08-02 LAB — RAD ONC ARIA SESSION SUMMARY
Course Elapsed Days: 4
Plan Fractions Treated to Date: 3
Plan Prescribed Dose Per Fraction: 3 Gy
Plan Total Fractions Prescribed: 10
Plan Total Prescribed Dose: 30 Gy
Reference Point Dosage Given to Date: 9 Gy
Reference Point Session Dosage Given: 3 Gy
Session Number: 3

## 2023-08-03 ENCOUNTER — Ambulatory Visit
Admission: RE | Admit: 2023-08-03 | Discharge: 2023-08-03 | Disposition: A | Discharge status: Home / Self Care | Source: Ambulatory Visit | Attending: Radiation Oncology | Admitting: Radiation Oncology

## 2023-08-03 ENCOUNTER — Other Ambulatory Visit: Payer: Self-pay

## 2023-08-03 ENCOUNTER — Other Ambulatory Visit: Payer: Self-pay | Admitting: Physician Assistant

## 2023-08-03 ENCOUNTER — Other Ambulatory Visit: Payer: Self-pay | Admitting: Radiation Oncology

## 2023-08-03 DIAGNOSIS — I2699 Other pulmonary embolism without acute cor pulmonale: Secondary | ICD-10-CM

## 2023-08-03 DIAGNOSIS — Z510 Encounter for antineoplastic radiation therapy: Secondary | ICD-10-CM | POA: Diagnosis not present

## 2023-08-03 LAB — RAD ONC ARIA SESSION SUMMARY
Course Elapsed Days: 5
Plan Fractions Treated to Date: 4
Plan Prescribed Dose Per Fraction: 3 Gy
Plan Total Fractions Prescribed: 10
Plan Total Prescribed Dose: 30 Gy
Reference Point Dosage Given to Date: 12 Gy
Reference Point Session Dosage Given: 3 Gy
Session Number: 4

## 2023-08-03 MED ORDER — SUCRALFATE 1 G PO TABS: 1.0000 g | ORAL_TABLET | Freq: Three times a day (TID) | ORAL | 1 refills | Status: AC

## 2023-08-04 ENCOUNTER — Other Ambulatory Visit: Payer: Self-pay

## 2023-08-04 ENCOUNTER — Telehealth: Payer: Self-pay | Admitting: Internal Medicine

## 2023-08-04 ENCOUNTER — Ambulatory Visit
Admission: RE | Admit: 2023-08-04 | Discharge: 2023-08-04 | Disposition: A | Discharge status: Home / Self Care | Source: Ambulatory Visit | Attending: Radiation Oncology | Admitting: Radiation Oncology

## 2023-08-04 DIAGNOSIS — Z510 Encounter for antineoplastic radiation therapy: Secondary | ICD-10-CM | POA: Diagnosis not present

## 2023-08-04 LAB — RAD ONC ARIA SESSION SUMMARY
Course Elapsed Days: 6
Plan Fractions Treated to Date: 5
Plan Prescribed Dose Per Fraction: 3 Gy
Plan Total Fractions Prescribed: 10
Plan Total Prescribed Dose: 30 Gy
Reference Point Dosage Given to Date: 15 Gy
Reference Point Session Dosage Given: 3 Gy
Session Number: 5

## 2023-08-04 NOTE — Telephone Encounter (Signed)
 Rescheduled/ scheduled appointments with the patient. The patient confirmed appointment details.

## 2023-08-05 ENCOUNTER — Ambulatory Visit
Admission: RE | Admit: 2023-08-05 | Discharge: 2023-08-05 | Disposition: A | Discharge status: Home / Self Care | Source: Ambulatory Visit | Attending: Radiation Oncology | Admitting: Radiation Oncology

## 2023-08-05 ENCOUNTER — Other Ambulatory Visit: Payer: Self-pay

## 2023-08-05 DIAGNOSIS — Z510 Encounter for antineoplastic radiation therapy: Secondary | ICD-10-CM | POA: Diagnosis not present

## 2023-08-05 LAB — RAD ONC ARIA SESSION SUMMARY
Course Elapsed Days: 7
Plan Fractions Treated to Date: 6
Plan Prescribed Dose Per Fraction: 3 Gy
Plan Total Fractions Prescribed: 10
Plan Total Prescribed Dose: 30 Gy
Reference Point Dosage Given to Date: 18 Gy
Reference Point Session Dosage Given: 3 Gy
Session Number: 6

## 2023-08-06 ENCOUNTER — Ambulatory Visit
Admission: RE | Admit: 2023-08-06 | Discharge: 2023-08-06 | Disposition: A | Discharge status: Home / Self Care | Source: Ambulatory Visit | Attending: Radiation Oncology | Admitting: Radiation Oncology

## 2023-08-06 ENCOUNTER — Other Ambulatory Visit: Payer: Self-pay

## 2023-08-06 DIAGNOSIS — Z510 Encounter for antineoplastic radiation therapy: Secondary | ICD-10-CM | POA: Diagnosis not present

## 2023-08-06 LAB — RAD ONC ARIA SESSION SUMMARY
Course Elapsed Days: 8
Plan Fractions Treated to Date: 7
Plan Prescribed Dose Per Fraction: 3 Gy
Plan Total Fractions Prescribed: 10
Plan Total Prescribed Dose: 30 Gy
Reference Point Dosage Given to Date: 21 Gy
Reference Point Session Dosage Given: 3 Gy
Session Number: 7

## 2023-08-09 ENCOUNTER — Ambulatory Visit: Admitting: Internal Medicine

## 2023-08-09 ENCOUNTER — Ambulatory Visit
Admission: RE | Admit: 2023-08-09 | Discharge: 2023-08-09 | Disposition: A | Discharge status: Home / Self Care | Source: Ambulatory Visit | Attending: Radiation Oncology | Admitting: Radiation Oncology

## 2023-08-09 ENCOUNTER — Other Ambulatory Visit: Payer: Self-pay

## 2023-08-09 ENCOUNTER — Other Ambulatory Visit

## 2023-08-09 ENCOUNTER — Ambulatory Visit

## 2023-08-09 ENCOUNTER — Encounter

## 2023-08-09 DIAGNOSIS — Z510 Encounter for antineoplastic radiation therapy: Secondary | ICD-10-CM | POA: Diagnosis not present

## 2023-08-09 LAB — RAD ONC ARIA SESSION SUMMARY
Course Elapsed Days: 11
Plan Fractions Treated to Date: 8
Plan Prescribed Dose Per Fraction: 3 Gy
Plan Total Fractions Prescribed: 10
Plan Total Prescribed Dose: 30 Gy
Reference Point Dosage Given to Date: 24 Gy
Reference Point Session Dosage Given: 3 Gy
Session Number: 8

## 2023-08-10 ENCOUNTER — Other Ambulatory Visit: Payer: Self-pay

## 2023-08-10 ENCOUNTER — Ambulatory Visit

## 2023-08-10 ENCOUNTER — Ambulatory Visit
Admission: RE | Admit: 2023-08-10 | Discharge: 2023-08-10 | Disposition: A | Discharge status: Home / Self Care | Source: Ambulatory Visit | Attending: Radiation Oncology | Admitting: Radiation Oncology

## 2023-08-10 DIAGNOSIS — Z510 Encounter for antineoplastic radiation therapy: Secondary | ICD-10-CM | POA: Insufficient documentation

## 2023-08-10 DIAGNOSIS — C3412 Malignant neoplasm of upper lobe, left bronchus or lung: Secondary | ICD-10-CM | POA: Diagnosis present

## 2023-08-10 DIAGNOSIS — Z87891 Personal history of nicotine dependence: Secondary | ICD-10-CM | POA: Diagnosis not present

## 2023-08-10 DIAGNOSIS — C7951 Secondary malignant neoplasm of bone: Secondary | ICD-10-CM | POA: Diagnosis present

## 2023-08-10 LAB — RAD ONC ARIA SESSION SUMMARY
Course Elapsed Days: 12
Plan Fractions Treated to Date: 9
Plan Prescribed Dose Per Fraction: 3 Gy
Plan Total Fractions Prescribed: 10
Plan Total Prescribed Dose: 30 Gy
Reference Point Dosage Given to Date: 27 Gy
Reference Point Session Dosage Given: 3 Gy
Session Number: 9

## 2023-08-11 ENCOUNTER — Ambulatory Visit
Admission: RE | Admit: 2023-08-11 | Discharge: 2023-08-11 | Disposition: A | Discharge status: Home / Self Care | Source: Ambulatory Visit | Attending: Radiation Oncology | Admitting: Radiation Oncology

## 2023-08-11 ENCOUNTER — Other Ambulatory Visit: Payer: Self-pay

## 2023-08-11 DIAGNOSIS — Z510 Encounter for antineoplastic radiation therapy: Secondary | ICD-10-CM | POA: Diagnosis not present

## 2023-08-11 LAB — RAD ONC ARIA SESSION SUMMARY
Course Elapsed Days: 13
Plan Fractions Treated to Date: 10
Plan Prescribed Dose Per Fraction: 3 Gy
Plan Total Fractions Prescribed: 10
Plan Total Prescribed Dose: 30 Gy
Reference Point Dosage Given to Date: 30 Gy
Reference Point Session Dosage Given: 3 Gy
Session Number: 10

## 2023-08-12 NOTE — Radiation Completion Notes (Addendum)
  Radiation Oncology         [phone] ________________________________  Name: Albert Hardy. MRN: [id number]  Date of Service: 08/11/2023  DOB: 1955-09-18  End of Treatment Note  Diagnosis: Stage IV  (cT2a, N2, M1b) squamous cell carcinoma of lung,  Intent: Palliative     ==========DELIVERED PLANS==========  First Treatment Date: 2023-07-29 Last Treatment Date: 2023-08-11   Plan Name: Lung_L Site: Lung, Left Technique: IMRT Mode: Photon Dose Per Fraction: 3 Gy Prescribed Dose (Delivered / Prescribed): 30 Gy / 30 Gy Prescribed Fxs (Delivered / Prescribed): 10 / 10    ====================================   The patient tolerated radiation. He developed fatigue and discomfort with swallowing throughout his treatment. He was given Carafate  to help with his esophagitis.   The patient will return in one month and will continue follow up with Dr. Sherrod as well.      Ronita Due, PA-C

## 2023-08-14 NOTE — Progress Notes (Unsigned)
 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    Molecular studies by [email or web address] showed no actionable mutations and PD-L1 expression of 70%.  PRIOR THERAPY: 1) Palliative radiotherapy to the T8 and L4 metastatic bone disease. 2) Systemic chemotherapy with carboplatin  for AUC of 5, paclitaxel  175 Mg/M2 and Libtayo  (Cempilimab) 350 Mg IV every 3 weeks with Neulasta  support for 4 cycles  3) Palliative radiation to the left hilar mass under the care of Dr. Shannon. Last dose on 08/11/23.   CURRENT THERAPY:  Maintenance treatment with single agent Libtayo  (Cempilimab) 350 Mg IV every 3 weeks.  First dose September 08, 2022.  Status post 15 cycles.  2) Xgeva  every 6 weeks   INTERVAL HISTORY: Albert Hardy. 68 y.o. male returns to the clinic today for a follow-up visit. He completed chemotherapy/immunotherpay as well as pallitive radiation to the bones. He is currently on maintenance immunotherapy.    At his last appointment with Dr. Sherrod and myself on 07/20/23, his scan showed  increased size of the left hilar mass which was causing central obstruction of the left upper lobe bronchus and increase in size of the left hilar and mediastinal lyph nodes and incrased ground class pulmonary nodules (which are non specific).   Dr. Sherrod recommended referral to radiation oncology and holding his immunotherapy until he completes radiation. After completion, the options included resuming treatment.   He completed radiation last week. His breathing is significantly improved. He is having his swallowing affected by the radiation. He has  an old prescription for reglan  and has been using carafate  which helps some. He lost weight.   He experiences more frequent night sweats but denies any recent infections, fevers, or chills. He has lost weight, likely due to radiation, and is currently consuming one protein shake per day, with plans to increase to two to help regain weight.  He reports a dry, non-productive cough that has developed since completing radiation. He has not taken any medication for the cough yet.  He occasionally experiences jabbing pain in the ribs, which lasts about an hour but is not as frequent as before. He is going to see palliative care today. He continues to take an iron supplement and a blood thinner.  He has recently experienced a new onset of headaches, which were severe for about a week. He describes them as sinus-like with pressure in the forehead and nasal area. He uses Flonase  and saline for nasal congestion, which sometimes alleviates the symptoms. No changes in speech, balance, or vision, although he acknowledges needing new glasses.  He is here for evaluation and repeat blood work before considering undergoing treatment with #16.      MEDICAL HISTORY: Past Medical History:  Diagnosis Date   Aortic regurgitation    Aortic stenosis 04/04/2018   Atherosclerosis of native arteries of the extremities with intermittent claudication 09/08/2013   Atherosclerosis of native artery of extremity with intermittent claudication (HCC) 09/08/2013   IMO SNOMED Dx Update Oct 2024     Bilateral impacted cerumen 10/13/2018   Bilateral sensorineural hearing loss 12/14/2018   Cancer (HCC)    lung  Dyspnea    Encounter for antineoplastic chemotherapy 08/18/2022   Encounter for antineoplastic immunotherapy 08/18/2022   Former moderate cigarette smoker (10-19 per day) 05/20/2012   Quit smoking Nov 2013 when hospitalized w/ HTN and chest pain(diagnosed w/ valvular heart disease)   GERD (gastroesophageal reflux  disease)    Heart murmur    History of radiation therapy    Lumbar Spine, Thoracic Spine- 06/25/22-07/10/22- Dr. 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  MEDICATIONS:  Current Outpatient Medications  Medication Sig Dispense Refill   albuterol  (VENTOLIN  HFA) 108 (90 Base) MCG/ACT inhaler Inhale 2 puffs into the lungs every 6 (six) hours as needed for wheezing or shortness of breath. 8 g 6   Ascorbic Acid (VITAMIN C PO) Take 1 tablet by mouth daily.     benzonatate  (TESSALON ) 100 MG capsule Take 1 capsule (100 mg total) by mouth 3 (three) times daily as needed. 30 capsule 2   Cholecalciferol (VITAMIN D3) 1000 UNITS CAPS Take 1,000 Units by mouth daily.     Coenzyme Q10 (CO Q 10 PO) Take 1 tablet by mouth daily.     Cyanocobalamin (VITAMIN B-12 PO) Take 1 tablet by mouth daily.     cyclobenzaprine (FLEXERIL) 5 MG tablet Take 1 tablet (5 mg total) by mouth 3 (three) times daily as needed for muscle spasms. 30 tablet 0   ELIQUIS  5 MG TABS tablet Take 1 tablet by mouth twice daily 60 tablet 0   ezetimibe  (ZETIA ) 10 MG tablet Take 1 tablet (10 mg total) by mouth daily. 90 tablet 3   fluticasone  (FLONASE ) 50 MCG/ACT nasal spray Place 1-2 sprays into both nostrils daily. 16 g 6   KRILL OIL PO Take 1 tablet by mouth daily.     levofloxacin  (LEVAQUIN ) 500 MG tablet Take 1 tablet (500 mg total) by mouth daily. 7 tablet 0   lisinopril -hydrochlorothiazide  (ZESTORETIC ) 10-12.5 MG tablet Take 1 tablet by mouth once daily 90 tablet 0   MAGNESIUM PO Take 1 tablet by mouth daily.     metoCLOPramide  (REGLAN ) 10 MG tablet Take 1 tablet (10 mg total) by mouth 4 (four)  times daily -  before meals and at bedtime. 56 tablet 0   metoprolol  tartrate (LOPRESSOR ) 25 MG tablet Take 1 tablet (25 mg total) by mouth once for 1 dose. Please take this medication 2 hours before CT 1 tablet 0   morphine  (MS CONTIN ) 15 MG 12 hr tablet Take 1 tablet (15 mg total) by mouth every 12 (twelve) hours. 60 tablet 0   Multiple Vitamins-Minerals (CENTRUM SILVER PO) Take 1 tablet by mouth daily.     Multiple Vitamins-Minerals (ZINC PO) Take 1 tablet by mouth daily.     Naphazoline-Pheniramine (OPCON-A) 0.027-0.315 % SOLN Place 1 drop into both eyes daily as needed (redness).     nitroGLYCERIN  (NITROSTAT ) 0.4 MG SL tablet Place 1 tablet (0.4 mg total) under the tongue every 5 (five) minutes as needed for chest pain. 25 tablet 1   omeprazole  (PRILOSEC) 20 MG capsule Take 1 capsule by mouth once daily 30 capsule 0   omeprazole  (PRILOSEC) 20 MG capsule  Take 1 capsule (20 mg total) by mouth daily. 30 capsule 0   oxyCODONE  (OXY IR/ROXICODONE ) 5 MG immediate release tablet Take 1 tablet (5 mg total) by mouth every 4 (four) hours as needed for severe pain (pain score 7-10). 90 tablet 0   potassium chloride  SA (KLOR-CON  M) 20 MEQ tablet Take 1 tablet (20 mEq total) by mouth daily. 6 tablet 0   prochlorperazine  (COMPAZINE ) 10 MG tablet Take 1 tablet (10 mg total) by mouth every 6 (six) hours as needed for nausea or vomiting. 30 tablet 0   rosuvastatin  (CRESTOR ) 5 MG tablet Take 1 tablet (5 mg total) by mouth at bedtime. 90 tablet 1   sucralfate  (CARAFATE ) 1 g tablet Take 1 tablet (1 g total) by mouth 4 (four) times daily -  with meals and at bedtime. Crush and dissolve in 10 mL's of warm water prior to swallowing, take 20 min prior to meals 60 tablet 1   triamcinolone  0.1%-Eucerin equivalent 1:1 cream mixture Apply topically 3 (three) times daily as needed. 480 g 2   TURMERIC PO Take 1 tablet by mouth 3 (three) times a week.     umeclidinium-vilanterol (ANORO ELLIPTA ) 62.5-25 MCG/ACT AEPB Inhale 1  puff into the lungs daily. 180 each 3   No current facility-administered medications for this visit.    SURGICAL HISTORY:  Past Surgical History:  Procedure Laterality Date   BRONCHIAL BIOPSY  05/26/2022   Procedure: BRONCHIAL BIOPSIES;  Surgeon: Brenna Adine CROME, DO;  Location: [facility];  Service: Pulmonary;;   BRONCHIAL BRUSHINGS  05/26/2022   Procedure: BRONCHIAL BRUSHINGS;  Surgeon: Brenna Adine CROME, DO;  Location: [facility];  Service: Pulmonary;;   NO PAST SURGERIES     VIDEO BRONCHOSCOPY  05/26/2022   Procedure: VIDEO BRONCHOSCOPY WITHOUT FLUORO;  Surgeon: Brenna Adine CROME, DO;  Location: [facility];  Service: Pulmonary;;    REVIEW OF SYSTEMS:   Review of Systems  Constitutional: Positive for weight loss and appetite change. Negative for chills, and fever.  HENT: Positive for mild dysphagia. Negative for mouth sores, nosebleeds, sore throat.   Eyes: Negative for eye problems and icterus.  Respiratory: Positive for cough. Improved dyspnea on exertion. Negative for hemoptysis and wheezing.   Cardiovascular: Negative for chest pain and leg swelling.  Gastrointestinal: Negative for abdominal pain, constipation, diarrhea, nausea and vomiting.  Genitourinary: Negative for bladder incontinence, difficulty urinating, dysuria, frequency and hematuria.   Musculoskeletal: Negative for back pain, gait problem, neck pain and neck stiffness.  Skin: Negative for itching and rash.  Neurological: Negative for dizziness, extremity weakness, gait problem, headaches (none at this time), light-headedness and seizures.  Hematological: Negative for adenopathy. Does not bruise/bleed easily.  Psychiatric/Behavioral: Negative for confusion, depression and sleep disturbance. The patient is not nervous/anxious.     PHYSICAL EXAMINATION:  Blood pressure (!) 117/59, pulse 96, temperature 99.2 F (37.3 C), temperature source Temporal, resp. rate 14, weight 125 lb 9.6 oz (57 kg), SpO2 98%.  ECOG  PERFORMANCE STATUS: 1  Physical Exam  Constitutional: Oriented to person, place, and time and thin appearing male, and in no distress.  HENT:  Head: Normocephalic and atraumatic.  Mouth/Throat: Oropharynx is clear and moist. No oropharyngeal exudate.  Eyes: Conjunctivae are normal. Right eye exhibits no discharge. Left eye exhibits no discharge. No scleral icterus.  Neck: Normal range of motion. Neck supple.  Cardiovascular: Normal rate, regular rhythm, normal heart sounds and intact distal pulses.   Pulmonary/Chest: Effort normal. Quiet breath sounds in right lung. No  respiratory distress. No wheezes. No rales.  Abdominal: Soft. Bowel sounds are normal. Exhibits no distension and no mass. There is no tenderness.  Musculoskeletal: Normal range of motion. Exhibits no edema.  Lymphadenopathy:    No cervical adenopathy.  Neurological: Alert and oriented to person, place, and time. Exhibits muscle wasting. Gait normal. Coordination normal.  Skin: Skin is warm and dry. No rash noted. Not diaphoretic. No erythema. No pallor.  Psychiatric: Mood, memory and judgment normal.  Vitals reviewed.  LABORATORY DATA: Lab Results  Component Value Date   WBC 4.2 08/17/2023   HGB 12.0 (L) 08/17/2023   HCT 36.6 (L) 08/17/2023   MCV 90.6 08/17/2023   PLT 240 08/17/2023      Chemistry      Component Value Date/Time   NA 139 08/17/2023 0834   NA 146 (H) 01/05/2023 1114   K 3.4 (L) 08/17/2023 0834   CL 102 08/17/2023 0834   CO2 25 08/17/2023 0834   BUN 19 08/17/2023 0834   BUN 11 01/05/2023 1114   CREATININE 1.04 08/17/2023 0834   CREATININE 1.07 08/27/2014 1511      Component Value Date/Time   CALCIUM  9.2 08/17/2023 0834   ALKPHOS 65 08/17/2023 0834   AST 18 08/17/2023 0834   ALT 24 08/17/2023 0834   BILITOT 0.3 08/17/2023 0834       RADIOGRAPHIC STUDIES:  No results found.   ASSESSMENT/PLAN:  This is a very pleasant 68 year old African-American male with stage IV (T2a, N2, M1 B  non-small cell lung cancer, squamous cell carcinoma.  He presented with a left upper lobe lung mass in addition to AP window lymphadenopathy and suspicious for metastases to T8 and L4 vertebrae as well as right retroperitoneal metastatic nodule.  He was diagnosed in April 2024.   His molecular studies by [email or web address] showed no actionable mutation.  His PD-L1 expression is 70%.   He underwent palliative radiation to the metastatic bone lesions.   He also receives Xgeva  every 6 weeks.  He is due for this at his next infusion   He is underwent palliative systemic chemotherapy and immunotherapy with carboplatin  for AUC of 5, paclitaxel  175 mg/m, and immunotherapy with Libtayo  350 mg IV every 3 weeks with Neulasta  support.  He is status post 4 cycles.     He started maintenance immunotherapy with Libtayo . He is status post 15 cycles of maintenance.   His restaging CT scan from June 2025 showed increased size of the left hilar mass which causes central obstruction of a left upper lobe bronchus, increase in the left hilar and mediastinal lymph nodes, and increased in numerous ground glass pulmonary nodules which are nonspecific.  Dr. Sherrod recommends referral to radiation to discuss palliative radiation to the obstructive lung mass.   He underwent radiation to the left hilar mass which was completed on 08/11/23.    The patient is overall doing well today.  When the patient was last seen with Dr. Sherrod he discussed resuming his treatment after completing radiation.  He will resume his treatment today as planned.  I will talk with Dr. Sherrod upon his return to the clinic next week to discuss the plan for the enlarging mediastinal and hilar lymph nodes on his last scan about continuing his current treatment with close interval follow-up imaging versus adjustments to his treatment plan.   He will continue taking his iron supplement for anemia.    Will continue taking his blood thinner.   He will see  palliative care today.  Dysphagia due to radiation therapy Dysphagia likely due to esophageal inflammation from radiation. Discussed potential long-term effects. - Continue Reglan  and Carafate . - Consider Maalox or Mylanta if needed. - Monitor symptoms; consider gastroenterology referral if persistent.  Cough due to radiation therapy Dry cough likely due to radiation-induced inflammation. Discussed starting treatments with minimal side effects. - Prescribe Tessalon . - Consider Robitussin or Delsym if preferred. - Contact provider if symptoms persist and can consider hycodan.  Weight loss due to radiation therapy Weight loss likely due to radiation therapy. - Increase protein shake intake to two per day. - Monitor for side effects like diarrhea.  Headaches Recent headaches possibly sinus-related. No neurological symptoms. Headaches subsided since last week. - Use Tylenol  for mild headaches. - Use Flonase  and saline for sinus congestion. - Monitor for changes in headache pattern. - Contact provider if headaches worsen for potential brain imaging.   The patient was advised to call immediately if he has any concerning symptoms in the interval. The patient voices understanding of current disease status and treatment options and is in agreement with the current care plan. All questions were answered. The patient knows to call the clinic with any problems, questions or concerns. We can certainly see the patient much sooner if necessary .        No orders of the defined types were placed in this encounter.    The total time spent in the appointment was 20-29 minutes  Shantee Hayne L Loukas Antonson, PA-C 08/17/23

## 2023-08-17 ENCOUNTER — Encounter: Payer: Self-pay | Admitting: Nurse Practitioner

## 2023-08-17 ENCOUNTER — Inpatient Hospital Stay: Attending: Internal Medicine

## 2023-08-17 ENCOUNTER — Inpatient Hospital Stay

## 2023-08-17 ENCOUNTER — Inpatient Hospital Stay (HOSPITAL_BASED_OUTPATIENT_CLINIC_OR_DEPARTMENT_OTHER): Admitting: Physician Assistant

## 2023-08-17 ENCOUNTER — Inpatient Hospital Stay (HOSPITAL_BASED_OUTPATIENT_CLINIC_OR_DEPARTMENT_OTHER): Admitting: Nurse Practitioner

## 2023-08-17 VITALS — BP 117/59 | HR 96 | Temp 99.2°F | Resp 14 | Wt 125.6 lb

## 2023-08-17 DIAGNOSIS — C3412 Malignant neoplasm of upper lobe, left bronchus or lung: Secondary | ICD-10-CM | POA: Insufficient documentation

## 2023-08-17 DIAGNOSIS — G893 Neoplasm related pain (acute) (chronic): Secondary | ICD-10-CM | POA: Diagnosis not present

## 2023-08-17 DIAGNOSIS — K5903 Drug induced constipation: Secondary | ICD-10-CM

## 2023-08-17 DIAGNOSIS — R630 Anorexia: Secondary | ICD-10-CM

## 2023-08-17 DIAGNOSIS — C7951 Secondary malignant neoplasm of bone: Secondary | ICD-10-CM | POA: Insufficient documentation

## 2023-08-17 DIAGNOSIS — Z5112 Encounter for antineoplastic immunotherapy: Secondary | ICD-10-CM | POA: Insufficient documentation

## 2023-08-17 DIAGNOSIS — C3492 Malignant neoplasm of unspecified part of left bronchus or lung: Secondary | ICD-10-CM

## 2023-08-17 DIAGNOSIS — R634 Abnormal weight loss: Secondary | ICD-10-CM

## 2023-08-17 DIAGNOSIS — Z515 Encounter for palliative care: Secondary | ICD-10-CM | POA: Diagnosis not present

## 2023-08-17 DIAGNOSIS — F17290 Nicotine dependence, other tobacco product, uncomplicated: Secondary | ICD-10-CM | POA: Insufficient documentation

## 2023-08-17 DIAGNOSIS — R530 Neoplastic (malignant) related fatigue: Secondary | ICD-10-CM

## 2023-08-17 DIAGNOSIS — Z79620 Long term (current) use of immunosuppressive biologic: Secondary | ICD-10-CM | POA: Insufficient documentation

## 2023-08-17 LAB — CMP (CANCER CENTER ONLY)
ALT: 24 U/L (ref 0–44)
AST: 18 U/L (ref 15–41)
Albumin: 3.9 g/dL (ref 3.5–5.0)
Alkaline Phosphatase: 65 U/L (ref 38–126)
Anion gap: 12 (ref 5–15)
BUN: 19 mg/dL (ref 8–23)
CO2: 25 mmol/L (ref 22–32)
Calcium: 9.2 mg/dL (ref 8.9–10.3)
Chloride: 102 mmol/L (ref 98–111)
Creatinine: 1.04 mg/dL (ref 0.61–1.24)
GFR, Estimated: >60 mL/min (ref >60)
Glucose, Bld: 100 mg/dL — ABNORMAL HIGH (ref 70–99)
Potassium: 3.4 mmol/L — ABNORMAL LOW (ref 3.5–5.1)
Sodium: 139 mmol/L (ref 135–145)
Total Bilirubin: 0.3 mg/dL (ref 0.0–1.2)
Total Protein: 7.6 g/dL (ref 6.5–8.1)

## 2023-08-17 LAB — CBC WITH DIFFERENTIAL (CANCER CENTER ONLY)
Abs Immature Granulocytes: 0.01 K/uL (ref 0.00–0.07)
Basophils Absolute: 0 K/uL (ref 0.0–0.1)
Basophils Relative: 1 %
Eosinophils Absolute: 0 K/uL (ref 0.0–0.5)
Eosinophils Relative: 1 %
HCT: 36.6 % — ABNORMAL LOW (ref 39.0–52.0)
Hemoglobin: 12 g/dL — ABNORMAL LOW (ref 13.0–17.0)
Immature Granulocytes: 0 %
Lymphocytes Relative: 15 %
Lymphs Abs: 0.6 K/uL — ABNORMAL LOW (ref 0.7–4.0)
MCH: 29.7 pg (ref 26.0–34.0)
MCHC: 32.8 g/dL (ref 30.0–36.0)
MCV: 90.6 fL (ref 80.0–100.0)
Monocytes Absolute: 0.4 K/uL (ref 0.1–1.0)
Monocytes Relative: 9 %
Neutro Abs: 3.1 K/uL (ref 1.7–7.7)
Neutrophils Relative %: 74 %
Platelet Count: 240 K/uL (ref 150–400)
RBC: 4.04 MIL/uL — ABNORMAL LOW (ref 4.22–5.81)
RDW: 12.5 % (ref 11.5–15.5)
WBC Count: 4.2 K/uL (ref 4.0–10.5)
nRBC: 0 % (ref 0.0–0.2)

## 2023-08-17 LAB — TSH: TSH: 1.29 u[IU]/mL (ref 0.350–4.500)

## 2023-08-17 MED ORDER — SODIUM CHLORIDE 0.9 % IV SOLN: Freq: Once | INTRAVENOUS | Status: AC

## 2023-08-17 MED ORDER — MORPHINE SULFATE ER 15 MG PO TBCR: 15.0000 mg | EXTENDED_RELEASE_TABLET | Freq: Two times a day (BID) | ORAL | 0 refills | Status: DC

## 2023-08-17 MED ORDER — SODIUM CHLORIDE 0.9 % IV SOLN
350.0000 mg | Freq: Once | INTRAVENOUS | Status: AC
  Administered 2023-08-17: 350 mg via INTRAVENOUS
  Filled 2023-08-17: qty 7

## 2023-08-17 MED ORDER — BENZONATATE 100 MG PO CAPS: 100.0000 mg | ORAL_CAPSULE | Freq: Three times a day (TID) | ORAL | 2 refills | Status: DC | PRN

## 2023-08-17 MED ORDER — DENOSUMAB 120 MG/1.7ML ~~LOC~~ SOLN
120.0000 mg | Freq: Once | SUBCUTANEOUS | Status: AC
  Administered 2023-08-17: 120 mg via SUBCUTANEOUS
  Filled 2023-08-17: qty 1.7

## 2023-08-17 NOTE — Progress Notes (Signed)
 Palliative Medicine [facility]  [phone] [phone]   Name: Albert Hardy. Date: 08/17/2023 MRN: [id number]  DOB: 24-Jan-1956  Patient Care Team: [name], MD as PCP - General (Family Medicine)    INTERVAL HISTORY: Albert Sudduth. is a 68 y.o. 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 Palliative ask to see for symptom management and goals of care   SOCIAL HISTORY:     reports that he quit smoking about 11 years ago. His smoking use included cigarettes. He started smoking about 41 years ago. He has a 30 pack-year smoking history. He has never used smokeless tobacco. He reports current alcohol use. He reports current drug use. Frequency: 1.00 time per week. Drug: Marijuana.  ADVANCE DIRECTIVES:  None on file  CODE STATUS: Full code  PAST MEDICAL HISTORY: Past Medical History:  Diagnosis Date   Aortic regurgitation    Aortic stenosis 04/04/2018   Atherosclerosis of native arteries of the extremities with intermittent claudication 09/08/2013   Atherosclerosis of native artery of extremity with intermittent claudication (HCC) 09/08/2013   IMO SNOMED Dx Update Oct 2024     Bilateral impacted cerumen 10/13/2018   Bilateral sensorineural hearing loss 12/14/2018   Cancer (HCC)    lung   Dyspnea    Encounter for antineoplastic chemotherapy 08/18/2022   Encounter for antineoplastic immunotherapy 08/18/2022   Former moderate cigarette smoker (10-19 per day) 05/20/2012   Quit smoking Nov 2013 when hospitalized w/ HTN and chest pain(diagnosed w/ valvular heart disease)   GERD (gastroesophageal reflux disease)    Heart murmur    History of radiation therapy    Lumbar Spine, Thoracic Spine- 06/25/22-07/10/22- Dr. 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  MEDICATIONS:  Current Outpatient Medications  Medication Sig Dispense Refill   albuterol  (VENTOLIN  HFA) 108 (90 Base) MCG/ACT inhaler Inhale 2 puffs into the lungs every 6 (six) hours as needed for wheezing or shortness of breath. 8 g 6   Ascorbic Acid (VITAMIN C PO) Take 1 tablet by mouth daily.     benzonatate  (TESSALON ) 100 MG capsule Take 1 capsule (100 mg total) by mouth 3 (three) times daily as needed. 30 capsule 2   Cholecalciferol (VITAMIN D3) 1000 UNITS CAPS Take 1,000 Units by mouth daily.     Coenzyme Q10 (CO Q 10 PO) Take 1 tablet by mouth daily.     Cyanocobalamin (VITAMIN B-12 PO) Take 1 tablet by mouth daily.     cyclobenzaprine (FLEXERIL) 5 MG tablet Take 1 tablet (5 mg total) by mouth 3 (three) times daily as needed for muscle spasms. 30 tablet 0   ELIQUIS  5 MG TABS tablet Take 1 tablet by mouth twice daily 60 tablet 0   ezetimibe  (ZETIA ) 10 MG tablet Take 1 tablet (10 mg total) by mouth daily. 90 tablet 3   fluticasone  (FLONASE ) 50 MCG/ACT nasal spray Place 1-2 sprays into both nostrils daily. 16 g 6   KRILL OIL PO Take 1 tablet by mouth daily.     levofloxacin  (LEVAQUIN ) 500 MG  tablet Take 1 tablet (500 mg total) by mouth daily. 7 tablet 0   lisinopril -hydrochlorothiazide  (ZESTORETIC ) 10-12.5 MG tablet Take 1 tablet by mouth once daily 90 tablet 0   MAGNESIUM PO Take 1 tablet by mouth daily.     metoCLOPramide  (REGLAN ) 10 MG tablet Take 1 tablet (10 mg total) by mouth 4 (four) times daily -  before meals and at bedtime. 56 tablet 0   metoprolol  tartrate (LOPRESSOR ) 25 MG tablet Take 1 tablet (25 mg total) by mouth once for 1 dose. Please take this medication 2 hours before CT 1 tablet 0   morphine  (MS CONTIN ) 15 MG 12 hr tablet Take 1  tablet (15 mg total) by mouth every 12 (twelve) hours. 60 tablet 0   Multiple Vitamins-Minerals (CENTRUM SILVER PO) Take 1 tablet by mouth daily.     Multiple Vitamins-Minerals (ZINC PO) Take 1 tablet by mouth daily.     Naphazoline-Pheniramine (OPCON-A) 0.027-0.315 % SOLN Place 1 drop into both eyes daily as needed (redness).     nitroGLYCERIN  (NITROSTAT ) 0.4 MG SL tablet Place 1 tablet (0.4 mg total) under the tongue every 5 (five) minutes as needed for chest pain. 25 tablet 1   omeprazole  (PRILOSEC) 20 MG capsule Take 1 capsule by mouth once daily 30 capsule 0   omeprazole  (PRILOSEC) 20 MG capsule Take 1 capsule (20 mg total) by mouth daily. 30 capsule 0   oxyCODONE  (OXY IR/ROXICODONE ) 5 MG immediate release tablet Take 1 tablet (5 mg total) by mouth every 4 (four) hours as needed for severe pain (pain score 7-10). 90 tablet 0   potassium chloride  SA (KLOR-CON  M) 20 MEQ tablet Take 1 tablet (20 mEq total) by mouth daily. 6 tablet 0   prochlorperazine  (COMPAZINE ) 10 MG tablet Take 1 tablet (10 mg total) by mouth every 6 (six) hours as needed for nausea or vomiting. 30 tablet 0   rosuvastatin  (CRESTOR ) 5 MG tablet Take 1 tablet (5 mg total) by mouth at bedtime. 90 tablet 1   sucralfate  (CARAFATE ) 1 g tablet Take 1 tablet (1 g total) by mouth 4 (four) times daily -  with meals and at bedtime. Crush and dissolve in 10 mL's of warm water prior to swallowing, take 20 min prior to meals 60 tablet 1   triamcinolone  0.1%-Eucerin equivalent 1:1 cream mixture Apply topically 3 (three) times daily as needed. 480 g 2   TURMERIC PO Take 1 tablet by mouth 3 (three) times a week.     umeclidinium-vilanterol (ANORO ELLIPTA ) 62.5-25 MCG/ACT AEPB Inhale 1 puff into the lungs daily. 180 each 3   No current facility-administered medications for this visit.    VITAL SIGNS: There were no vitals taken for this visit. There were no vitals filed for this visit.  Estimated body mass index is 18.55 kg/m as calculated  from the following:   Height as of 07/22/23: 5' 9 (1.753 m).   Weight as of an earlier encounter on 08/17/23: 125 lb 9.6 oz (57 kg).     Latest Ref Rng & Units 08/17/2023    8:34 AM 07/20/2023    2:42 PM 06/29/2023   10:13 AM  CBC  WBC 4.0 - 10.5 K/uL 4.2  4.3  3.6   Hemoglobin 13.0 - 17.0 g/dL 87.9  88.0  88.0   Hematocrit 39.0 - 52.0 % 36.6  35.7  35.4   Platelets 150 - 400 K/uL 240  202  188        Latest Ref Rng &  Units 08/17/2023    8:34 AM 07/20/2023    2:42 PM 06/29/2023   10:13 AM  CMP  Glucose 70 - 99 mg/dL 899  890  99   BUN 8 - 23 mg/dL 19  22  30    Creatinine 0.61 - 1.24 mg/dL 8.95  8.96  8.94   Sodium 135 - 145 mmol/L 139  138  135   Potassium 3.5 - 5.1 mmol/L 3.4  3.8  4.2   Chloride 98 - 111 mmol/L 102  103  101   CO2 22 - 32 mmol/L 25  30  28    Calcium  8.9 - 10.3 mg/dL 9.2  9.2  9.3   Total Protein 6.5 - 8.1 g/dL 7.6  7.4  7.3   Total Bilirubin 0.0 - 1.2 mg/dL 0.3  0.2  0.3   Alkaline Phos 38 - 126 U/L 65  54  51   AST 15 - 41 U/L 18  20  18    ALT 0 - 44 U/L 24  23  21      PERFORMANCE STATUS (ECOG) : 1 - Symptomatic but completely ambulatory   Physical Exam General: NAD Cardiovascular: RRR Pulmonary: normal breathing pattern  Extremities: no edema, no joint deformities Skin: no rashes Neurological: AAO x4  IMPRESSION: Discussed the use of AI scribe software for clinical note transcription with the patient, who gave verbal consent to proceed.  History of Present Illness Albert Soderholm. is a 69 year old male who presents for symptom management follow-up. No acute distress. Denies nausea, vomiting, constipation, or diarrhea.   He experiences fluctuations in his appetite, with periods of three days where he eats well followed by a day and a half where his stomach feels unsettled. During these times, his stomach feels like it's 'wandering all the time', and food smells unusual, which discourages him from eating. He is unsure if this is due to nausea or an upset  stomach. We discussed his symptoms and ways to manage decreased appetite with non-pharmacological methods. Certain smells can make him not want to eat, and he sometimes feels 'undone' in his stomach.  His weight has fluctuated recently, reaching 127 pounds without shoes, but then dropping to 123 pounds after a two-day period of poor appetite. He finds this fluctuation discouraging and is trying to maintain his weight by eating small, frequent meals, which he finds helps with his appetite and fatigue.  Albert Hardy reports his pain is well controlled on current regimen. He is currently taking MS Contin  15mg  every 12 hours. Does not require breakthrough medication. No adjustments to regimen at this time.   We will continue to closely follow and support. All questions answered and support provided.  Goals of Care  06/18/22-  We discussed his current illness and what it means in the larger context of hsi on-going co-morbidities. Natural disease trajectory and expectations were discussed.  Albert Hardy and his wife are realistic in their expectations and understanding of his incurable cancer. He knows all treatment is palliative focused. Wishes to continue taking things one day at a time focusing on his quality of life allowing him every opportunity to continue to thrive.   We discussed Her current illness and what it means in the larger context of Her on-going co-morbidities. Natural disease trajectory and expectations were discussed.  I discussed the importance of continued conversation with family and their medical providers regarding overall plan of care and treatment options, ensuring decisions are within the context of the patients values and  GOCs. Assessment & Plan Appetite fluctuations and weight loss Intermittent appetite fluctuations with associated weight loss. Weight increased to 127 pounds but decreased to 123 pounds following a two-day period of decreased appetite. Appetite is influenced by  stomach discomfort and food aversions. Encouraged by weight gain but experiences frustration with fluctuations. - Encourage consumption of small, frequent meals - Advise trying softer foods like eggs on days with poor appetite - Reinforce the importance of maintaining nutritional intake  Stomach discomfort Stomach discomfort characterized by a sensation of the stomach 'wandering' and aversion to food smells. No clear distinction between nausea and upset stomach. Discomfort does not correlate with constipation. Symptoms contribute to decreased appetite. - Encourage small, frequent meals to help manage symptoms - Advise trying softer foods on days with increased discomfort   Chronic Cancer Related Pain Management Patient reports pain is controlled overall. Some days are better than others. No adjustments at this time.  -Continue current regimen of MS Contin  and oxycodone  as needed. -Will continue to closely follow and adjust regimen as needed.  Constipation No current issues. Patient has been taking Senna-S 2 caps daily. -Continue current regimen.  I will plan to follow-up with patient in 4-6 weeks. Sooner if needed.  Patient expressed understanding and was in agreement with this plan. He also understands that He can call the clinic at any time with any questions, concerns, or complaints.   Any controlled substances utilized were prescribed in the context of palliative care. PDMP has been reviewed.   Visit consisted of counseling and education dealing with the complex and emotionally intense issues of symptom management and palliative care in the setting of serious and potentially life-threatening illness.  Albert Hardy, AGPCNP-BC  Palliative Medicine Team/[facility]

## 2023-08-17 NOTE — Patient Instructions (Addendum)
 [facility] - A DEPT OF [facility]  Discharge Instructions: Thank you for choosing [facility] to provide your oncology and hematology care.   If you have a lab appointment with the Cancer Center, please go directly to the Cancer Center and check in at the registration area.   Wear comfortable clothing and clothing appropriate for easy access to any Portacath or PICC line.   We strive to give you quality time with your provider. You may need to reschedule your appointment if you arrive late (15 or more minutes).  Arriving late affects you and other patients whose appointments are after yours.  Also, if you miss three or more appointments without notifying the office, you may be dismissed from the clinic at the provider's discretion.      For prescription refill requests, have your pharmacy contact our office and allow 72 hours for refills to be completed.    Today you received the following chemotherapy and/or immunotherapy agents: Libtayo      To help prevent nausea and vomiting after your treatment, we encourage you to take your nausea medication as directed.  BELOW ARE SYMPTOMS THAT SHOULD BE REPORTED IMMEDIATELY: *FEVER GREATER THAN 100.4 F (38 C) OR HIGHER *CHILLS OR SWEATING *NAUSEA AND VOMITING THAT IS NOT CONTROLLED WITH YOUR NAUSEA MEDICATION *UNUSUAL SHORTNESS OF BREATH *UNUSUAL BRUISING OR BLEEDING *URINARY PROBLEMS (pain or burning when urinating, or frequent urination) *BOWEL PROBLEMS (unusual diarrhea, constipation, pain near the anus) TENDERNESS IN MOUTH AND THROAT WITH OR WITHOUT PRESENCE OF ULCERS (sore throat, sores in mouth, or a toothache) UNUSUAL RASH, SWELLING OR PAIN  UNUSUAL VAGINAL DISCHARGE OR ITCHING   Items with * indicate a potential emergency and should be followed up as soon as possible or go to the Emergency Department if any problems should occur.  Please show the CHEMOTHERAPY ALERT CARD or IMMUNOTHERAPY  ALERT CARD at check-in to the Emergency Department and triage nurse.  Should you have questions after your visit or need to cancel or reschedule your appointment, please contact [facility] - A DEPT [facility]  Dept: [phone]  and follow the prompts.  Office hours are 8:00 a.m. to 4:30 p.m. Monday - Friday. Please note that voicemails left after 4:00 p.m. may not be returned until the following business day.  We are closed weekends and major holidays. You have access to a nurse at all times for urgent questions. Please call the main number to the clinic Dept: [phone] and follow the prompts.   For any non-urgent questions, you may also contact your provider using MyChart. We now offer e-Visits for anyone 21 and older to request care online for non-urgent symptoms. For details visit [email or web address].   Also download the MyChart app! Go to the app store, search "MyChart", open the app, select [place], and log in with your MyChart username and password.  Denosumab Injection (Oncology) What is this medication? DENOSUMAB (den oh SUE mab) prevents weakened bones caused by cancer. It may also be used to treat noncancerous bone tumors that cannot be removed by surgery. It can also be used to treat high calcium levels in the blood caused by cancer. It works by blocking a protein that causes bones to break down quickly. This slows down the release of calcium from bones, which lowers calcium levels in your blood. It also makes your bones stronger and less likely to break (fracture). This  medicine may be used for other purposes; ask your health care provider or pharmacist if you have questions. COMMON BRAND NAME(S): XGEVA What should I tell my care team before I take this medication? They need to know if you have any of these conditions: Dental disease Having surgery or tooth extraction Infection Kidney disease Low levels of calcium or vitamin D in the  blood Malnutrition On hemodialysis Skin conditions or sensitivity Thyroid or parathyroid disease An unusual reaction to denosumab, other medications, foods, dyes, or preservatives Pregnant or trying to get pregnant Breast-feeding How should I use this medication? This medication is for injection under the skin. It is given by your care team in a hospital or clinic setting. A special MedGuide will be given to you before each treatment. Be sure to read this information carefully each time. Talk to your care team about the use of this medication in children. While it may be prescribed for children as young as 13 years for selected conditions, precautions do apply. Overdosage: If you think you have taken too much of this medicine contact a poison control center or emergency room at once. NOTE: This medicine is only for you. Do not share this medicine with others. What if I miss a dose? Keep appointments for follow-up doses. It is important not to miss your dose. Call your care team if you are unable to keep an appointment. What may interact with this medication? Do not take this medication with any of the following: Other medications containing denosumab This medication may also interact with the following: Medications that lower your chance of fighting infection Steroid medications, such as prednisone or cortisone This list may not describe all possible interactions. Give your health care provider a list of all the medicines, herbs, non-prescription drugs, or dietary supplements you use. Also tell them if you smoke, drink alcohol, or use illegal drugs. Some items may interact with your medicine. What should I watch for while using this medication? Your condition will be monitored carefully while you are receiving this medication. You may need blood work while taking this medication. This medication may increase your risk of getting an infection. Call your care team for advice if you get a  fever, chills, sore throat, or other symptoms of a cold or flu. Do not treat yourself. Try to avoid being around people who are sick. You should make sure you get enough calcium and vitamin D while you are taking this medication, unless your care team tells you not to. Discuss the foods you eat and the vitamins you take with your care team. Some people who take this medication have severe bone, joint, or muscle pain. This medication may also increase your risk for jaw problems or a broken thigh bone. Tell your care team right away if you have severe pain in your jaw, bones, joints, or muscles. Tell your care team if you have any pain that does not go away or that gets worse. Talk to your care team if you may be pregnant. Serious birth defects can occur if you take this medication during pregnancy and for 5 months after the last dose. You will need a negative pregnancy test before starting this medication. Contraception is recommended while taking this medication and for 5 months after the last dose. Your care team can help you find the option that works for you. What side effects may I notice from receiving this medication? Side effects that you should report to your care team as soon as  possible: Allergic reactions--skin rash, itching, hives, swelling of the face, lips, tongue, or throat Bone, joint, or muscle pain Low calcium level--muscle pain or cramps, confusion, tingling, or numbness in the hands or feet Osteonecrosis of the jaw--pain, swelling, or redness in the mouth, numbness of the jaw, poor healing after dental work, unusual discharge from the mouth, visible bones in the mouth Side effects that usually do not require medical attention (report to your care team if they continue or are bothersome): Cough Diarrhea Fatigue Headache Nausea This list may not describe all possible side effects. Call your doctor for medical advice about side effects. You may report side effects to FDA at  1-800-FDA-1088. Where should I keep my medication? This medication is given in a hospital or clinic. It will not be stored at home. NOTE: This sheet is a summary. It may not cover all possible information. If you have questions about this medicine, talk to your doctor, pharmacist, or health care provider.  2024 Elsevier/Gold Standard (2021-06-18 00:00:00)

## 2023-08-18 LAB — T4: T4, Total: 8.5 ug/dL (ref 4.5–12.0)

## 2023-08-19 ENCOUNTER — Inpatient Hospital Stay

## 2023-08-28 ENCOUNTER — Other Ambulatory Visit: Payer: Self-pay | Admitting: Family Medicine

## 2023-08-28 ENCOUNTER — Other Ambulatory Visit: Payer: Self-pay | Admitting: Physician Assistant

## 2023-08-28 DIAGNOSIS — E785 Hyperlipidemia, unspecified: Secondary | ICD-10-CM

## 2023-08-28 DIAGNOSIS — I2699 Other pulmonary embolism without acute cor pulmonale: Secondary | ICD-10-CM

## 2023-08-28 DIAGNOSIS — K219 Gastro-esophageal reflux disease without esophagitis: Secondary | ICD-10-CM

## 2023-08-31 ENCOUNTER — Ambulatory Visit

## 2023-08-31 ENCOUNTER — Other Ambulatory Visit

## 2023-08-31 ENCOUNTER — Ambulatory Visit: Admitting: Physician Assistant

## 2023-08-31 ENCOUNTER — Encounter

## 2023-08-31 ENCOUNTER — Other Ambulatory Visit: Payer: Self-pay | Admitting: Internal Medicine

## 2023-09-03 NOTE — Progress Notes (Signed)
 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    Molecular studies by [email or web address] showed no actionable mutations and PD-L1 expression of 70%.  PRIOR THERAPY: 1) Palliative radiotherapy to the T8 and L4 metastatic bone disease. 2) Systemic chemotherapy with carboplatin  for AUC of 5, paclitaxel  175 Mg/M2 and Libtayo  (Cempilimab) 350 Mg IV every 3 weeks with Neulasta  support for 4 cycles  3) Palliative radiation to the left hilar mass under the care of Dr. Shannon. Last dose on 08/11/23.   CURRENT THERAPY: Maintenance treatment with single agent Libtayo  (Cempilimab) 350 Mg IV every 3 weeks.  First dose September 08, 2022.  Status post 16 cycles.  2) Xgeva  every 6 weeks  INTERVAL HISTORY: Albert Hardy. 68 y.o. male returns to the clinic today for a follow-up visit accompanied by his wife. He completed chemotherapy/immunotherpay as well as palliative radiation to the bones. He is currently on maintenance immunotherapy.   At his last appointment with Dr. Sherrod and myself on 07/20/23, his scan showed  increased size of the left hilar mass which was causing central obstruction of the left upper lobe bronchus and increase in size of the left hilar and mediastinal lyph nodes and incrased ground class pulmonary nodules (which are non specific).    Dr. Sherrod recommended referral to radiation oncology and holding his immunotherapy until he completes radiation. After completion, the options included resuming treatment.   He completed radiation on 08/11/23 and he resumed his immunotherapy 3 weeks ago.   However, in the last week or so,  he developed two new symptoms. He has been experiencing hemoptysis, which began as specks last Tuesday and has progressed to bright red. The blood is mixed with mucus and occurs whenever he clears his chest. He is currently on Eliquis  for a blood clot in his lung which he believes was about 3 months ago but it looks like this was first prescribed about 1 year ago.  He denies any other associated symptoms.  His hemoglobin is stable.  He is not hypoxic he denies any worsening shortness of breath.  He denies any changes in the frequency of his cough and he denies any signs and symptoms of infection such as fevers, sore throat, or changes in his energy.  He also has been having increased hoarseness that started about a week ago and has not resolved.  No new or unusual chest pain, but he mentions a different sensation in his chest that is not quite discomfort. He also reports intermittent dysphagia, where it sometimes feels like food gets stuck which occurred after radiation. This only occurs sometimes.   No fever, new shortness of breath, nausea, vomiting, diarrhea, or unusual rashes. He experiences night sweats, which he attributes to environmental factors, such as using a duvet. He also has a history of constipation, which has not worsened.  He completed radiation therapy on July 2nd and has not experienced any significant headaches since his last visit.  He is here for evaluation and repeat blood work before considering undergoing treatment with #17.    MEDICAL HISTORY: Past Medical History:  Diagnosis Date   Aortic regurgitation  Aortic stenosis 04/04/2018   Atherosclerosis of native arteries of the extremities with intermittent claudication 09/08/2013   Atherosclerosis of native artery of extremity with intermittent claudication (HCC) 09/08/2013   IMO SNOMED Dx Update Oct 2024     Bilateral impacted cerumen 10/13/2018   Bilateral sensorineural hearing loss 12/14/2018   Cancer (HCC)    lung    Dyspnea    Encounter for antineoplastic chemotherapy 08/18/2022   Encounter for antineoplastic immunotherapy 08/18/2022   Former moderate cigarette smoker (10-19 per day) 05/20/2012   Quit smoking Nov 2013 when hospitalized w/ HTN and chest pain(diagnosed w/ valvular heart disease)   GERD (gastroesophageal reflux disease)    Heart murmur    History of radiation therapy    Lumbar Spine, Thoracic Spine- 06/25/22-07/10/22- Dr. 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  MEDICATIONS:  Current Outpatient Medications  Medication Sig Dispense Refill   albuterol  (VENTOLIN  HFA) 108 (90 Base) MCG/ACT inhaler Inhale 2 puffs into the lungs every 6 (six) hours as needed for wheezing or shortness of breath. 8 g 6   Ascorbic Acid (VITAMIN C PO) Take 1 tablet by mouth daily.     benzonatate  (TESSALON ) 100 MG capsule Take 1 capsule (100 mg total) by mouth 3 (three) times daily as needed. 30 capsule 2   Cholecalciferol (VITAMIN D3) 1000 UNITS CAPS Take 1,000 Units by mouth daily.     Coenzyme Q10 (CO Q 10 PO) Take 1 tablet by mouth daily.     Cyanocobalamin (VITAMIN B-12 PO) Take 1 tablet by mouth daily.     cyclobenzaprine (FLEXERIL) 5 MG tablet Take 1 tablet (5 mg total) by mouth 3 (three) times daily as needed for muscle spasms. 30 tablet 0   ELIQUIS  5 MG TABS tablet Take 1 tablet by mouth twice daily 60 tablet 0   ezetimibe  (ZETIA ) 10 MG tablet Take 1 tablet by mouth once daily 90 tablet 0   fluticasone  (FLONASE ) 50 MCG/ACT nasal spray Place 1-2 sprays into both nostrils daily. 16 g 6   KRILL OIL PO Take 1 tablet by mouth daily.     levofloxacin  (LEVAQUIN ) 500  MG tablet Take 1 tablet (500 mg total) by mouth daily. 7 tablet 0   lisinopril -hydrochlorothiazide  (ZESTORETIC ) 10-12.5 MG tablet Take 1 tablet by mouth once daily 90 tablet 0   MAGNESIUM PO Take 1 tablet by mouth daily.     metoCLOPramide  (REGLAN ) 10 MG tablet Take 1 tablet (10 mg total) by mouth 4 (four) times daily -  before meals and at bedtime. 56 tablet 0   metoprolol  tartrate (LOPRESSOR ) 25 MG tablet Take 1 tablet (25 mg total) by mouth once for 1 dose. Please take this medication 2 hours before CT 1 tablet 0   morphine  (MS CONTIN ) 15 MG 12 hr tablet Take 1 tablet (15 mg total) by mouth every 12 (twelve) hours. 60 tablet 0   Multiple Vitamins-Minerals (CENTRUM SILVER PO) Take 1 tablet by mouth daily.     Multiple Vitamins-Minerals (ZINC PO) Take 1 tablet by mouth daily.     Naphazoline-Pheniramine (OPCON-A) 0.027-0.315 % SOLN Place 1 drop into both  eyes daily as needed (redness).     nitroGLYCERIN  (NITROSTAT ) 0.4 MG SL tablet Place 1 tablet (0.4 mg total) under the tongue every 5 (five) minutes as needed for chest pain. 25 tablet 1   omeprazole  (PRILOSEC) 20 MG capsule Take 1 capsule (20 mg total) by mouth daily. 30 capsule 0   omeprazole  (PRILOSEC) 20 MG capsule Take 1 capsule by mouth once daily 30 capsule 0   oxyCODONE  (OXY IR/ROXICODONE ) 5 MG immediate release tablet Take 1 tablet (5 mg total) by mouth every 4 (four) hours as needed for severe pain (pain score 7-10). 90 tablet 0   potassium chloride  SA (KLOR-CON  M) 20 MEQ tablet Take 1 tablet (20 mEq total) by mouth daily. 6 tablet 0   prochlorperazine  (COMPAZINE ) 10 MG tablet Take 1 tablet (10 mg total) by mouth every 6 (six) hours as needed for nausea or vomiting. 30 tablet 0   rosuvastatin  (CRESTOR ) 5 MG tablet Take 1 tablet (5 mg total) by mouth at bedtime. 90 tablet 1   sucralfate  (CARAFATE ) 1 g tablet Take 1 tablet (1 g total) by mouth 4 (four) times daily -  with meals and at bedtime. Crush and dissolve in 10 mL's of warm water  prior to swallowing, take 20 min prior to meals 60 tablet 1   triamcinolone  0.1%-Eucerin equivalent 1:1 cream mixture Apply topically 3 (three) times daily as needed. 480 g 2   TURMERIC PO Take 1 tablet by mouth 3 (three) times a week.     umeclidinium-vilanterol (ANORO ELLIPTA ) 62.5-25 MCG/ACT AEPB Inhale 1 puff into the lungs daily. 180 each 3   No current facility-administered medications for this visit.    SURGICAL HISTORY:  Past Surgical History:  Procedure Laterality Date   BRONCHIAL BIOPSY  05/26/2022   Procedure: BRONCHIAL BIOPSIES;  Surgeon: Brenna Adine CROME, DO;  Location: [facility];  Service: Pulmonary;;   BRONCHIAL BRUSHINGS  05/26/2022   Procedure: BRONCHIAL BRUSHINGS;  Surgeon: Brenna Adine CROME, DO;  Location: [facility];  Service: Pulmonary;;   NO PAST SURGERIES     VIDEO BRONCHOSCOPY  05/26/2022   Procedure: VIDEO BRONCHOSCOPY WITHOUT FLUORO;  Surgeon: Brenna Adine CROME, DO;  Location: [facility];  Service: Pulmonary;;    REVIEW OF SYSTEMS:   Review of Systems  Constitutional:  Negative for chills, fever, chills, unexplained weight loss, and fever.  HENT: Positive for hoarseness. Positive for mild dysphagia. Negative for mouth sores, nosebleeds, sore throat.   Eyes: Negative for eye problems and icterus.  Respiratory: Positive for hemoptysis. Stable breathing and intermittent cough. Improved dyspnea on exertion. Negative for wheezing.   Cardiovascular: Negative for chest pain and leg swelling.  Gastrointestinal: Positive for intermittent constipation. Negative for abdominal pain, diarrhea, nausea and vomiting.  Genitourinary: Negative for bladder incontinence, difficulty urinating, dysuria, frequency and hematuria.   Musculoskeletal: Negative for back pain, gait problem, neck pain and neck stiffness.  Skin: Negative for itching and rash.  Neurological: Negative for dizziness, extremity weakness, gait problem, headaches, light-headedness and seizures.  Hematological:  Negative for adenopathy. Does not bruise/bleed easily.  Psychiatric/Behavioral: Negative for confusion, depression and sleep disturbance. The patient is not nervous/anxious.       PHYSICAL EXAMINATION:  There were no vitals taken for this visit.  ECOG PERFORMANCE STATUS: 1  Physical Exam  Constitutional: Oriented to person, place, and time and thin appearing male, and in no distress.  HENT:  Head: Normocephalic and atraumatic.  Mouth/Throat: Oropharynx is clear and moist. No oropharyngeal exudate.  Eyes: Conjunctivae  are normal. Right eye exhibits no discharge. Left eye exhibits no discharge. No scleral icterus.  Neck: Normal range of motion. Neck supple.  Cardiovascular: Normal rate, regular rhythm, normal heart sounds and intact distal pulses.   Pulmonary/Chest: Effort normal. Quiet breath sounds bilaterally. No respiratory distress. No wheezes. No rales.  Abdominal: Soft. Bowel sounds are normal. Exhibits no distension and no mass. There is no tenderness.  Musculoskeletal: Normal range of motion. Exhibits no edema.  Lymphadenopathy:    No cervical adenopathy.  Neurological: Alert and oriented to person, place, and time. Exhibits muscle wasting. Gait normal. Coordination normal.  Skin: Skin is warm and dry. No rash noted. Not diaphoretic. No erythema. No pallor.  Psychiatric: Mood, memory and judgment normal.  Vitals reviewed.  LABORATORY DATA: Lab Results  Component Value Date   WBC 4.2 08/17/2023   HGB 12.0 (L) 08/17/2023   HCT 36.6 (L) 08/17/2023   MCV 90.6 08/17/2023   PLT 240 08/17/2023      Chemistry      Component Value Date/Time   NA 139 08/17/2023 0834   NA 146 (H) 01/05/2023 1114   K 3.4 (L) 08/17/2023 0834   CL 102 08/17/2023 0834   CO2 25 08/17/2023 0834   BUN 19 08/17/2023 0834   BUN 11 01/05/2023 1114   CREATININE 1.04 08/17/2023 0834   CREATININE 1.07 08/27/2014 1511      Component Value Date/Time   CALCIUM  9.2 08/17/2023 0834   ALKPHOS 65  08/17/2023 0834   AST 18 08/17/2023 0834   ALT 24 08/17/2023 0834   BILITOT 0.3 08/17/2023 0834       RADIOGRAPHIC STUDIES:  No results found.   ASSESSMENT/PLAN:  This is a very pleasant 68 year old African-American male with stage IV (T2a, N2, M1 B non-small cell lung cancer, squamous cell carcinoma.  He presented with a left upper lobe lung mass in addition to AP window lymphadenopathy and suspicious for metastases to T8 and L4 vertebrae as well as right retroperitoneal metastatic nodule.  He was diagnosed in April 2024.   His molecular studies by [email or web address] showed no actionable mutation.  His PD-L1 expression is 70%.   He underwent palliative radiation to the metastatic bone lesions.   He also receives Xgeva  every 6 weeks.  He is due for this at his next infusion   He is underwent palliative systemic chemotherapy and immunotherapy with carboplatin  for AUC of 5, paclitaxel  175 mg/m, and immunotherapy with Libtayo  350 mg IV every 3 weeks with Neulasta  support.  He is status post 4 cycles.     He started maintenance immunotherapy with Libtayo . He is status post 16 cycles of maintenance.   His restaging CT scan from June 2025 showed increased size of the left hilar mass which causes central obstruction of a left upper lobe bronchus, increase in the left hilar and mediastinal lymph nodes, and increased in numerous ground glass pulmonary nodules which are nonspecific.  Dr. Sherrod recommends referral to radiation to discuss palliative radiation to the obstructive lung mass.    He underwent radiation to the left hilar mass which was completed on 08/11/23.    The patient was seen with Dr. Sherrod today due to his new concerns with hoarseness and hemoptysis.  The patient's vitals are stable and his hemoglobin is stable at 12.  The patient denies any increased shortness of breath or changes in his respiratory function.  We will arrange for stat CT scan of the chest, abdomen, pelvis, and  neck.  I  will call the patient with results.   Dr. Sherrod he will proceed with treatment today as planned.  We will see him back for follow-up in 3 weeks or sooner if there is any concerns for progression on his scan.   He will continue taking his iron supplement for anemia.    Will continue taking his blood thinner for now due to his history of PE.SABRA   He will see palliative care today while in the infusion room.  Did review I would have a low threshold for the patient to seek ER evaluation if he develops any unusual new or worsening symptoms in the interval such as changes with his respiratory status, increased hemoptysis, or unstable vitals. He expressed understanding.   The patient was advised to call immediately if he has any concerning symptoms in the interval. The patient voices understanding of current disease status and treatment options and is in agreement with the current care plan. All questions were answered. The patient knows to call the clinic with any problems, questions or concerns. We can certainly see the patient much sooner if necessary     No orders of the defined types were placed in this encounter.    Allaina Brotzman L Tesa Meadors, PA-C 09/03/23  ADDENDUM: Hematology/Oncology Attending: I had a face-to-face encounter with the patient today.  I reviewed his record, lab and recommended his care plan.  This is a very pleasant 68 years old African-American male with a stage IV non-small cell lung cancer, squamous cell carcinoma diagnosed in April 2024 with PD-L1 expression of 70% and no actionable mutations.  He started systemic chemotherapy initially with carboplatin , paclitaxel  and Libtayo  (Cempilimab) every 3 weeks for 3 cycles.  He has been on maintenance treatment with single agent Libtayo  (Cempilimab) every 3 weeks for 16 cycles.  He is also on Xgeva  every 6 weeks for the bone disease. The patient was found to have enlargement of left hilar mass and he underwent  palliative radiotherapy to this area under the care of Dr. Shannon completed August 11, 2023. He has been complaining of cough with blood-tinged sputum and he is currently on anticoagulation with Eliquis  for history of pulmonary embolism.  He also has some increased hoarseness of his voice over the last 2 weeks. I recommended for the patient to proceed with his treatment today with cycle #17 of the immunotherapy but we will arrange for the patient to have repeat CT scan of the chest, abdomen and pelvis tomorrow for evaluation of his disease and to rule out any disease progression responsible for the hemoptysis and the hoarseness of his voice. The patient will come back for follow-up visit in 3 weeks if there is no concerning findings on the scan but will be happy to see him sooner if there is any concerning findings. The patient and his wife agreed to the current plan. He was advised to call immediately if he has any other concerning symptoms. The total time spent in the appointment was 30 minutes including review of chart and various tests results, discussions about plan of care and coordination of care plan . Disclaimer: This note was dictated with voice recognition software. Similar sounding words can inadvertently be transcribed and may be missed upon review. Sherrod MARLA Sherrod, MD

## 2023-09-07 ENCOUNTER — Inpatient Hospital Stay

## 2023-09-07 ENCOUNTER — Inpatient Hospital Stay (HOSPITAL_BASED_OUTPATIENT_CLINIC_OR_DEPARTMENT_OTHER): Admitting: Physician Assistant

## 2023-09-07 ENCOUNTER — Encounter

## 2023-09-07 ENCOUNTER — Encounter: Payer: Self-pay | Admitting: Internal Medicine

## 2023-09-07 ENCOUNTER — Inpatient Hospital Stay: Admitting: Nurse Practitioner

## 2023-09-07 ENCOUNTER — Encounter: Payer: Self-pay | Admitting: Nurse Practitioner

## 2023-09-07 VITALS — BP 108/59 | HR 88 | Temp 98.7°F | Resp 15 | Wt 125.0 lb

## 2023-09-07 DIAGNOSIS — Z5112 Encounter for antineoplastic immunotherapy: Secondary | ICD-10-CM

## 2023-09-07 DIAGNOSIS — C3492 Malignant neoplasm of unspecified part of left bronchus or lung: Secondary | ICD-10-CM

## 2023-09-07 DIAGNOSIS — R042 Hemoptysis: Secondary | ICD-10-CM | POA: Diagnosis not present

## 2023-09-07 DIAGNOSIS — R490 Dysphonia: Secondary | ICD-10-CM | POA: Diagnosis not present

## 2023-09-07 LAB — CBC WITH DIFFERENTIAL (CANCER CENTER ONLY)
Abs Immature Granulocytes: 0.01 K/uL (ref 0.00–0.07)
Basophils Absolute: 0 K/uL (ref 0.0–0.1)
Basophils Relative: 0 %
Eosinophils Absolute: 0 K/uL (ref 0.0–0.5)
Eosinophils Relative: 0 %
HCT: 35.3 % — ABNORMAL LOW (ref 39.0–52.0)
Hemoglobin: 12 g/dL — ABNORMAL LOW (ref 13.0–17.0)
Immature Granulocytes: 0 %
Lymphocytes Relative: 13 %
Lymphs Abs: 0.6 K/uL — ABNORMAL LOW (ref 0.7–4.0)
MCH: 29.9 pg (ref 26.0–34.0)
MCHC: 34 g/dL (ref 30.0–36.0)
MCV: 88 fL (ref 80.0–100.0)
Monocytes Absolute: 0.4 K/uL (ref 0.1–1.0)
Monocytes Relative: 9 %
Neutro Abs: 3.7 K/uL (ref 1.7–7.7)
Neutrophils Relative %: 78 %
Platelet Count: 220 K/uL (ref 150–400)
RBC: 4.01 MIL/uL — ABNORMAL LOW (ref 4.22–5.81)
RDW: 13 % (ref 11.5–15.5)
WBC Count: 4.7 K/uL (ref 4.0–10.5)
nRBC: 0 % (ref 0.0–0.2)

## 2023-09-07 LAB — CMP (CANCER CENTER ONLY)
ALT: 27 U/L (ref 0–44)
AST: 20 U/L (ref 15–41)
Albumin: 3.9 g/dL (ref 3.5–5.0)
Alkaline Phosphatase: 65 U/L (ref 38–126)
Anion gap: 9 (ref 5–15)
BUN: 30 mg/dL — ABNORMAL HIGH (ref 8–23)
CO2: 27 mmol/L (ref 22–32)
Calcium: 9.2 mg/dL (ref 8.9–10.3)
Chloride: 100 mmol/L (ref 98–111)
Creatinine: 1.06 mg/dL (ref 0.61–1.24)
GFR, Estimated: >60 mL/min (ref >60)
Glucose, Bld: 116 mg/dL — ABNORMAL HIGH (ref 70–99)
Potassium: 3.9 mmol/L (ref 3.5–5.1)
Sodium: 136 mmol/L (ref 135–145)
Total Bilirubin: 0.3 mg/dL (ref 0.0–1.2)
Total Protein: 7.9 g/dL (ref 6.5–8.1)

## 2023-09-07 LAB — TSH: TSH: 1.16 u[IU]/mL (ref 0.350–4.500)

## 2023-09-07 MED ORDER — SODIUM CHLORIDE 0.9 % IV SOLN
350.0000 mg | Freq: Once | INTRAVENOUS | Status: AC
  Administered 2023-09-07: 350 mg via INTRAVENOUS
  Filled 2023-09-07: qty 7

## 2023-09-07 MED ORDER — SODIUM CHLORIDE 0.9 % IV SOLN: Freq: Once | INTRAVENOUS | Status: AC

## 2023-09-07 NOTE — Patient Instructions (Signed)
 [facility] - A DEPT OF [facility]  Discharge Instructions: Thank you for choosing [facility] to provide your oncology and hematology care.   If you have a lab appointment with the Cancer Center, please go directly to the Cancer Center and check in at the registration area.   Wear comfortable clothing and clothing appropriate for easy access to any Portacath or PICC line.   We strive to give you quality time with your provider. You may need to reschedule your appointment if you arrive late (15 or more minutes).  Arriving late affects you and other patients whose appointments are after yours.  Also, if you miss three or more appointments without notifying the office, you may be dismissed from the clinic at the provider's discretion.      For prescription refill requests, have your pharmacy contact our office and allow 72 hours for refills to be completed.    Today you received the following chemotherapy and/or immunotherapy agents: Libtayo      To help prevent nausea and vomiting after your treatment, we encourage you to take your nausea medication as directed.  BELOW ARE SYMPTOMS THAT SHOULD BE REPORTED IMMEDIATELY: *FEVER GREATER THAN 100.4 F (38 C) OR HIGHER *CHILLS OR SWEATING *NAUSEA AND VOMITING THAT IS NOT CONTROLLED WITH YOUR NAUSEA MEDICATION *UNUSUAL SHORTNESS OF BREATH *UNUSUAL BRUISING OR BLEEDING *URINARY PROBLEMS (pain or burning when urinating, or frequent urination) *BOWEL PROBLEMS (unusual diarrhea, constipation, pain near the anus) TENDERNESS IN MOUTH AND THROAT WITH OR WITHOUT PRESENCE OF ULCERS (sore throat, sores in mouth, or a toothache) UNUSUAL RASH, SWELLING OR PAIN  UNUSUAL VAGINAL DISCHARGE OR ITCHING   Items with * indicate a potential emergency and should be followed up as soon as possible or go to the Emergency Department if any problems should occur.  Please show the CHEMOTHERAPY ALERT CARD or IMMUNOTHERAPY  ALERT CARD at check-in to the Emergency Department and triage nurse.  Should you have questions after your visit or need to cancel or reschedule your appointment, please contact [facility] - A DEPT OF [facility]  Dept: [phone]  and follow the prompts.  Office hours are 8:00 a.m. to 4:30 p.m. Monday - Friday. Please note that voicemails left after 4:00 p.m. may not be returned until the following business day.  We are closed weekends and major holidays. You have access to a nurse at all times for urgent questions. Please call the main number to the clinic Dept: [phone] and follow the prompts.   For any non-urgent questions, you may also contact your provider using MyChart. We now offer e-Visits for anyone 21 and older to request care online for non-urgent symptoms. For details visit [email or web address].   Also download the MyChart app! Go to the app store, search "MyChart", open the app, select [place], and log in with your MyChart username and password.  Denosumab Injection (Oncology) What is this medication? DENOSUMAB (den oh SUE mab) prevents weakened bones caused by cancer. It may also be used to treat noncancerous bone tumors that cannot be removed by surgery. It can also be used to treat high calcium levels in the blood caused by cancer. It works by blocking a protein that causes bones to break down quickly. This slows down the release of calcium from bones, which lowers calcium levels in your blood. It also makes your bones stronger and less likely to break (fracture). This  medicine may be used for other purposes; ask your health care provider or pharmacist if you have questions. COMMON BRAND NAME(S): XGEVA What should I tell my care team before I take this medication? They need to know if you have any of these conditions: Dental disease Having surgery or tooth extraction Infection Kidney disease Low levels of calcium or vitamin D in the  blood Malnutrition On hemodialysis Skin conditions or sensitivity Thyroid or parathyroid disease An unusual reaction to denosumab, other medications, foods, dyes, or preservatives Pregnant or trying to get pregnant Breast-feeding How should I use this medication? This medication is for injection under the skin. It is given by your care team in a hospital or clinic setting. A special MedGuide will be given to you before each treatment. Be sure to read this information carefully each time. Talk to your care team about the use of this medication in children. While it may be prescribed for children as young as 13 years for selected conditions, precautions do apply. Overdosage: If you think you have taken too much of this medicine contact a poison control center or emergency room at once. NOTE: This medicine is only for you. Do not share this medicine with others. What if I miss a dose? Keep appointments for follow-up doses. It is important not to miss your dose. Call your care team if you are unable to keep an appointment. What may interact with this medication? Do not take this medication with any of the following: Other medications containing denosumab This medication may also interact with the following: Medications that lower your chance of fighting infection Steroid medications, such as prednisone or cortisone This list may not describe all possible interactions. Give your health care provider a list of all the medicines, herbs, non-prescription drugs, or dietary supplements you use. Also tell them if you smoke, drink alcohol, or use illegal drugs. Some items may interact with your medicine. What should I watch for while using this medication? Your condition will be monitored carefully while you are receiving this medication. You may need blood work while taking this medication. This medication may increase your risk of getting an infection. Call your care team for advice if you get a  fever, chills, sore throat, or other symptoms of a cold or flu. Do not treat yourself. Try to avoid being around people who are sick. You should make sure you get enough calcium and vitamin D while you are taking this medication, unless your care team tells you not to. Discuss the foods you eat and the vitamins you take with your care team. Some people who take this medication have severe bone, joint, or muscle pain. This medication may also increase your risk for jaw problems or a broken thigh bone. Tell your care team right away if you have severe pain in your jaw, bones, joints, or muscles. Tell your care team if you have any pain that does not go away or that gets worse. Talk to your care team if you may be pregnant. Serious birth defects can occur if you take this medication during pregnancy and for 5 months after the last dose. You will need a negative pregnancy test before starting this medication. Contraception is recommended while taking this medication and for 5 months after the last dose. Your care team can help you find the option that works for you. What side effects may I notice from receiving this medication? Side effects that you should report to your care team as soon as  possible: Allergic reactions--skin rash, itching, hives, swelling of the face, lips, tongue, or throat Bone, joint, or muscle pain Low calcium level--muscle pain or cramps, confusion, tingling, or numbness in the hands or feet Osteonecrosis of the jaw--pain, swelling, or redness in the mouth, numbness of the jaw, poor healing after dental work, unusual discharge from the mouth, visible bones in the mouth Side effects that usually do not require medical attention (report to your care team if they continue or are bothersome): Cough Diarrhea Fatigue Headache Nausea This list may not describe all possible side effects. Call your doctor for medical advice about side effects. You may report side effects to FDA at  1-800-FDA-1088. Where should I keep my medication? This medication is given in a hospital or clinic. It will not be stored at home. NOTE: This sheet is a summary. It may not cover all possible information. If you have questions about this medicine, talk to your doctor, pharmacist, or health care provider.  2024 Elsevier/Gold Standard (2021-06-18 00:00:00)

## 2023-09-08 ENCOUNTER — Ambulatory Visit (HOSPITAL_COMMUNITY)
Admission: RE | Admit: 2023-09-08 | Discharge: 2023-09-08 | Disposition: A | Discharge status: Home / Self Care | Source: Ambulatory Visit | Attending: Physician Assistant | Admitting: Physician Assistant

## 2023-09-08 ENCOUNTER — Encounter: Payer: Self-pay | Admitting: Radiation Oncology

## 2023-09-08 DIAGNOSIS — R490 Dysphonia: Secondary | ICD-10-CM | POA: Insufficient documentation

## 2023-09-08 DIAGNOSIS — C3492 Malignant neoplasm of unspecified part of left bronchus or lung: Secondary | ICD-10-CM | POA: Diagnosis present

## 2023-09-08 DIAGNOSIS — R042 Hemoptysis: Secondary | ICD-10-CM | POA: Insufficient documentation

## 2023-09-08 LAB — T4: T4, Total: 9.1 ug/dL (ref 4.5–12.0)

## 2023-09-08 MED ORDER — IOHEXOL 300 MG/ML  SOLN
100.0000 mL | Freq: Once | INTRAMUSCULAR | Status: AC | PRN
  Administered 2023-09-08: 100 mL via INTRAVENOUS

## 2023-09-08 NOTE — Progress Notes (Signed)
 Radiation Oncology         [phone]) [phone] ________________________________  Name: Albert Hardy. MRN: [id number]  Date: 09/09/2023  DOB: 1955-04-29  Follow-Up Visit Note  CC: [name], MD  [name], MD  No diagnosis found.  Diagnosis:  The encounter diagnosis was Malignant neoplasm of upper lobe bronchus, left (HCC).    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): presented with a left upper lobe lung mass in addition to AP window lymphadenopathy and suspicious bone metastasis to the T8 and L4 vertebrae in addition to retroperitoneal metastatic soft tissue nodule diagnosed in April 2024, now with an increase in size of the left hilar mass causing central obstruction of a LUL bronchus  Interval Since Last Radiation:  *** {days/weeks/months:3041583} Intent: palliative   First Treatment Date: 2023-07-29 Last Treatment Date: 2023-08-11   Plan Name: Lung_L Site: Lung, Left Technique: IMRT Mode: Photon Dose Per Fraction: 3 Gy Prescribed Dose (Delivered / Prescribed): 30 Gy / 30 Gy Prescribed Fxs (Delivered / Prescribed): 10 / 10  Narrative:  The patient returns today for routine follow-up. He was last seen in office on 07/22/23 for a consultation visit. Since then, patient completed his radiation treatment which he  tolerated quite well. Patient did however endorse experiencing fatigue and discomfort with swallowing throughout his treatment for which he was prescribed Carafate  for.   In the interval since she was last seen, he  presented for a follow up visit with PA Heilingoetter on 08/17/23 during which he reported continuing symptoms of dysphagia since completion of radiation. He continues to take Reglan  and Carafate  to manage his symptoms. During his visit, he also complained of several issues including a cough for which he was prescribed Tessalon  for and weight loss due to treatments.   Per  recommendation, patient opted to resume his maintenance therapy with Libtayo  (Cempilimab) 350 Mg.     During his most recent follow up on 09/07/23, patient complained of hemoptysis that tends to occur whenever he clears his chest and increased hoarseness that has persisted since the prior week. He will undergo a CT scan of the chest, abdomen, pelvis, and neck  to further evaluate his symptoms.  Of note: patient recently had a CT c/a/p and CT neck on 09/08/23, results pending.                               Allergies:  is allergic to lipitor [atorvastatin] and pravastatin.  Meds: Current Outpatient Medications  Medication Sig Dispense Refill   albuterol  (VENTOLIN  HFA) 108 (90 Base) MCG/ACT inhaler Inhale 2 puffs into the lungs every 6 (six) hours as needed for wheezing or shortness of breath. 8 g 6   Ascorbic Acid (VITAMIN C PO) Take 1 tablet by mouth daily.     benzonatate  (TESSALON ) 100 MG capsule Take 1 capsule (100 mg total) by mouth 3 (three) times daily as needed. 30 capsule 2   Cholecalciferol (VITAMIN D3) 1000 UNITS CAPS Take 1,000 Units by mouth daily.     Coenzyme Q10 (CO Q 10 PO) Take 1 tablet by mouth daily.     Cyanocobalamin (VITAMIN B-12 PO) Take 1 tablet by mouth daily.     cyclobenzaprine (FLEXERIL) 5 MG tablet Take 1 tablet (5 mg total) by mouth 3 (three) times daily as needed for muscle spasms. 30 tablet 0   ELIQUIS  5  MG TABS tablet Take 1 tablet by mouth twice daily 60 tablet 0   ezetimibe  (ZETIA ) 10 MG tablet Take 1 tablet by mouth once daily 90 tablet 0   fluticasone  (FLONASE ) 50 MCG/ACT nasal spray Place 1-2 sprays into both nostrils daily. 16 g 6   KRILL OIL PO Take 1 tablet by mouth daily.     levofloxacin  (LEVAQUIN ) 500 MG tablet Take 1 tablet (500 mg total) by mouth daily. 7 tablet 0   lisinopril -hydrochlorothiazide  (ZESTORETIC ) 10-12.5 MG tablet Take 1 tablet by mouth once daily 90 tablet 0   MAGNESIUM PO Take 1 tablet by mouth daily.     metoCLOPramide  (REGLAN ) 10 MG  tablet Take 1 tablet (10 mg total) by mouth 4 (four) times daily -  before meals and at bedtime. 56 tablet 0   metoprolol  tartrate (LOPRESSOR ) 25 MG tablet Take 1 tablet (25 mg total) by mouth once for 1 dose. Please take this medication 2 hours before CT 1 tablet 0   morphine  (MS CONTIN ) 15 MG 12 hr tablet Take 1 tablet (15 mg total) by mouth every 12 (twelve) hours. 60 tablet 0   Multiple Vitamins-Minerals (CENTRUM SILVER PO) Take 1 tablet by mouth daily.     Multiple Vitamins-Minerals (ZINC PO) Take 1 tablet by mouth daily.     Naphazoline-Pheniramine (OPCON-A) 0.027-0.315 % SOLN Place 1 drop into both eyes daily as needed (redness).     nitroGLYCERIN  (NITROSTAT ) 0.4 MG SL tablet Place 1 tablet (0.4 mg total) under the tongue every 5 (five) minutes as needed for chest pain. 25 tablet 1   omeprazole  (PRILOSEC) 20 MG capsule Take 1 capsule (20 mg total) by mouth daily. 30 capsule 0   omeprazole  (PRILOSEC) 20 MG capsule Take 1 capsule by mouth once daily 30 capsule 0   oxyCODONE  (OXY IR/ROXICODONE ) 5 MG immediate release tablet Take 1 tablet (5 mg total) by mouth every 4 (four) hours as needed for severe pain (pain score 7-10). 90 tablet 0   potassium chloride  SA (KLOR-CON  M) 20 MEQ tablet Take 1 tablet (20 mEq total) by mouth daily. 6 tablet 0   prochlorperazine  (COMPAZINE ) 10 MG tablet Take 1 tablet (10 mg total) by mouth every 6 (six) hours as needed for nausea or vomiting. 30 tablet 0   rosuvastatin  (CRESTOR ) 5 MG tablet Take 1 tablet (5 mg total) by mouth at bedtime. 90 tablet 1   sucralfate  (CARAFATE ) 1 g tablet Take 1 tablet (1 g total) by mouth 4 (four) times daily -  with meals and at bedtime. Crush and dissolve in 10 mL's of warm water prior to swallowing, take 20 min prior to meals 60 tablet 1   triamcinolone  0.1%-Eucerin equivalent 1:1 cream mixture Apply topically 3 (three) times daily as needed. 480 g 2   TURMERIC PO Take 1 tablet by mouth 3 (three) times a week.      umeclidinium-vilanterol (ANORO ELLIPTA ) 62.5-25 MCG/ACT AEPB Inhale 1 puff into the lungs daily. 180 each 3   No current facility-administered medications for this encounter.    Physical Findings: The patient is in no acute distress. Patient is alert and oriented.  vitals were not taken for this visit. .  No significant changes. Lungs are clear to auscultation bilaterally. Heart has regular rate and rhythm. No palpable cervical, supraclavicular, or axillary adenopathy. Abdomen soft, non-tender, normal bowel sounds.   Lab Findings: Lab Results  Component Value Date   WBC 4.7 09/07/2023   HGB 12.0 (L) 09/07/2023   HCT  35.3 (L) 09/07/2023   MCV 88.0 09/07/2023   PLT 220 09/07/2023    Radiographic Findings: No results found.  Impression:  Stage IV  (cT2a, N2, M1b) squamous cell carcinoma of lung,  The patient is recovering from the effects of radiation.  ***  Plan:  ***   *** minutes of total time was spent for this patient encounter, including preparation, face-to-face counseling with the patient and coordination of care, physical exam, and documentation of the encounter. ____________________________________  Lynwood CHARM Nasuti, PhD, MD  This document serves as a record of services personally performed by Lynwood Nasuti, MD. It was created on his behalf by Reymundo Cartwright, a trained medical scribe. The creation of this record is based on the scribe's personal observations and the provider's statements to them. This document has been checked and approved by the attending provider.

## 2023-09-09 ENCOUNTER — Ambulatory Visit
Admission: RE | Admit: 2023-09-09 | Discharge: 2023-09-09 | Disposition: A | Discharge status: Home / Self Care | Source: Ambulatory Visit | Attending: Radiation Oncology | Admitting: Radiation Oncology

## 2023-09-09 ENCOUNTER — Encounter: Payer: Self-pay | Admitting: Radiation Oncology

## 2023-09-09 ENCOUNTER — Telehealth: Payer: Self-pay | Admitting: Physician Assistant

## 2023-09-09 VITALS — BP 106/51 | HR 66 | Temp 97.9°F | Resp 18 | Ht 69.0 in | Wt 126.2 lb

## 2023-09-09 DIAGNOSIS — R590 Localized enlarged lymph nodes: Secondary | ICD-10-CM | POA: Insufficient documentation

## 2023-09-09 DIAGNOSIS — C3492 Malignant neoplasm of unspecified part of left bronchus or lung: Secondary | ICD-10-CM

## 2023-09-09 DIAGNOSIS — Z923 Personal history of irradiation: Secondary | ICD-10-CM | POA: Insufficient documentation

## 2023-09-09 DIAGNOSIS — R490 Dysphonia: Secondary | ICD-10-CM | POA: Diagnosis not present

## 2023-09-09 DIAGNOSIS — Z79899 Other long term (current) drug therapy: Secondary | ICD-10-CM | POA: Insufficient documentation

## 2023-09-09 DIAGNOSIS — I700 Atherosclerosis of aorta: Secondary | ICD-10-CM | POA: Diagnosis not present

## 2023-09-09 DIAGNOSIS — R042 Hemoptysis: Secondary | ICD-10-CM | POA: Diagnosis not present

## 2023-09-09 DIAGNOSIS — M62838 Other muscle spasm: Secondary | ICD-10-CM | POA: Insufficient documentation

## 2023-09-09 DIAGNOSIS — C3412 Malignant neoplasm of upper lobe, left bronchus or lung: Secondary | ICD-10-CM | POA: Diagnosis not present

## 2023-09-09 DIAGNOSIS — C7951 Secondary malignant neoplasm of bone: Secondary | ICD-10-CM | POA: Insufficient documentation

## 2023-09-09 DIAGNOSIS — Z7901 Long term (current) use of anticoagulants: Secondary | ICD-10-CM | POA: Insufficient documentation

## 2023-09-09 NOTE — Progress Notes (Signed)
 Albert Hardy. is here today for follow up post radiation to the lung.  Lung Side: Left Side  Does the patient complain of any of the following: Pain: Lower back pain 7/10 Shortness of breath w/wo exertion: Intermittently Cough: Patients denies Hemoptysis: States about a week ago. Hoarseness Pain with swallowing: Patient denies Swallowing/choking concerns: States that the food sometimes gets stuck in throat. Appetite: Good Energy Level: Fair Post radiation skin Changes: Patient denies    Additional comments if applicable: BP (!) 106/51 (BP Location: Left Arm, Patient Position: Sitting, Cuff Size: Normal)   Pulse 66   Temp 97.9 F (36.6 C)   Resp 18   Ht 5' 9 (1.753 m)   Wt 126 lb 3.2 oz (57.2 kg)   SpO2 99%   BMI 18.64 kg/m

## 2023-09-09 NOTE — Telephone Encounter (Signed)
 Attempted to call the patient to review his scan results from yesterday. Left a voicemail with our call back number. I will also try calling back later as well.

## 2023-09-10 ENCOUNTER — Other Ambulatory Visit: Payer: Self-pay | Admitting: Physician Assistant

## 2023-09-10 ENCOUNTER — Telehealth: Payer: Self-pay | Admitting: Physician Assistant

## 2023-09-10 ENCOUNTER — Telehealth: Payer: Self-pay | Admitting: Medical Oncology

## 2023-09-10 DIAGNOSIS — J3800 Paralysis of vocal cords and larynx, unspecified: Secondary | ICD-10-CM

## 2023-09-10 DIAGNOSIS — C3492 Malignant neoplasm of unspecified part of left bronchus or lung: Secondary | ICD-10-CM

## 2023-09-10 DIAGNOSIS — R490 Dysphonia: Secondary | ICD-10-CM

## 2023-09-10 NOTE — Telephone Encounter (Signed)
 LVM at [facility] to return my call regarding a referral .

## 2023-09-10 NOTE — Telephone Encounter (Signed)
 Faxe referral and records to [facility].

## 2023-09-10 NOTE — Telephone Encounter (Signed)
 I called the patient to review his CT scan that was performed earlier this week for the chief complaint of hoarseness and hemoptysis.  The scan did not show any disease progression.  His worse this is likely secondary to vocal cord paralysis from his left sided lung cancer.  I called the patient today.  His Hemoptysis is improving.  I reviewed his CT scan with Dr. Sherrod yesterday.  Dr. Sherrod recommended referral to ENT to see if there is any intervention that can be performed to help with his vocal cord paralysis to try to preserve his voice.  I talked to the patient about this and he is agreeable to a referral.  I have placed the referral and we will fax this to their office today.

## 2023-09-14 ENCOUNTER — Other Ambulatory Visit: Payer: Self-pay | Admitting: Nurse Practitioner

## 2023-09-14 DIAGNOSIS — C3492 Malignant neoplasm of unspecified part of left bronchus or lung: Secondary | ICD-10-CM

## 2023-09-14 DIAGNOSIS — Z515 Encounter for palliative care: Secondary | ICD-10-CM

## 2023-09-14 DIAGNOSIS — G893 Neoplasm related pain (acute) (chronic): Secondary | ICD-10-CM

## 2023-09-14 MED ORDER — OXYCODONE HCL 5 MG PO TABS: 5.0000 mg | ORAL_TABLET | ORAL | 0 refills | Status: DC | PRN

## 2023-09-14 MED ORDER — MORPHINE SULFATE ER 15 MG PO TBCR: 15.0000 mg | EXTENDED_RELEASE_TABLET | Freq: Two times a day (BID) | ORAL | 0 refills | Status: DC

## 2023-09-16 ENCOUNTER — Encounter (INDEPENDENT_AMBULATORY_CARE_PROVIDER_SITE_OTHER): Payer: Self-pay | Admitting: Otolaryngology

## 2023-09-16 ENCOUNTER — Ambulatory Visit (INDEPENDENT_AMBULATORY_CARE_PROVIDER_SITE_OTHER): Admitting: Otolaryngology

## 2023-09-16 VITALS — BP 105/72 | HR 102

## 2023-09-16 DIAGNOSIS — Z87891 Personal history of nicotine dependence: Secondary | ICD-10-CM

## 2023-09-16 DIAGNOSIS — C3492 Malignant neoplasm of unspecified part of left bronchus or lung: Secondary | ICD-10-CM | POA: Diagnosis not present

## 2023-09-16 DIAGNOSIS — J3801 Paralysis of vocal cords and larynx, unilateral: Secondary | ICD-10-CM

## 2023-09-16 DIAGNOSIS — R490 Dysphonia: Secondary | ICD-10-CM | POA: Diagnosis not present

## 2023-09-16 DIAGNOSIS — J383 Other diseases of vocal cords: Secondary | ICD-10-CM

## 2023-09-16 NOTE — Patient Instructions (Signed)

## 2023-09-16 NOTE — Progress Notes (Signed)
 ENT CONSULT:  Reason for Consult: hx of lung cancer and left VF paralysis    HPI: Discussed the use of AI scribe software for clinical note transcription with the patient, who gave verbal consent to proceed.  History of Present Illness Albert Ellena. is a 68 year old male with lung cancer who presents with loss of voice.  He lost his voice approximately two weeks ago. He is undergoing maintenance therapy for lung cancer, and recent scans have raised concerns about vocal cord paralysis. He strains to talk and notes that his voice has not returned since the onset of symptoms.  He experiences occasional difficulty with swallowing, describing it as feeling like 'stuff gets stuck a little bit.' He also reports some labored breathing recently, although it is not as severe as it was prior to undergoing radiation therapy, which had previously improved his breathing.  His past medical history is significant for lung cancer, and he has a history of smoking, having quit in 2013.     Records Reviewed:  Palliative Medicine note 08/17/23  INTERVAL HISTORY: Albert Hardy. is a 68 y.o. 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 Palliative ask to see for symptom management and goals of care    Oncology office visit Cassandra Heilingoetter PA DIAGNOSIS: Stage IV (T2a, N2, M1 B) non-small cell lung cancer, squamous cell carcinoma presented with left upper lobe lung mass in addition to AP window lymphadenopathy and suspicious bone metastasis to the T8 and L4 vertebrae in addition to retroperitoneal metastatic soft tissue nodule diagnosed in April 2024.    Molecular studies by [id number] showed no actionable mutations and PD-L1 expression of 70%.   PRIOR THERAPY: 1) Palliative radiotherapy to the T8 and L4 metastatic bone disease. 2) Systemic chemotherapy with carboplatin  for AUC of 5, paclitaxel  175 Mg/M2 and Libtayo  (Cempilimab) 350  Mg IV every 3 weeks with Neulasta  support for 4 cycles  3) Palliative radiation to the left hilar mass under the care of Dr. Shannon. Last dose on 08/11/23.    CURRENT THERAPY:  Maintenance treatment with single agent Libtayo  (Cempilimab) 350 Mg IV every 3 weeks.  First dose September 08, 2022.  Status post 15 cycles.  2) Xgeva  every 6 weeks   Past Medical History:  Diagnosis Date   Aortic regurgitation    Aortic stenosis 04/04/2018   Atherosclerosis of native arteries of the extremities with intermittent claudication 09/08/2013   Atherosclerosis of native artery of extremity with intermittent claudication (HCC) 09/08/2013   IMO SNOMED Dx Update Oct 2024     Bilateral impacted cerumen 10/13/2018   Bilateral sensorineural hearing loss 12/14/2018   Cancer (HCC)    lung   Dyspnea    Encounter for antineoplastic chemotherapy 08/18/2022   Encounter for antineoplastic immunotherapy 08/18/2022   Former moderate cigarette smoker (10-19 per day) 05/20/2012   Quit smoking Nov 2013 when hospitalized w/ HTN and chest pain(diagnosed w/ valvular heart disease)   GERD (gastroesophageal reflux disease)    Heart murmur    History of radiation therapy    Lumbar Spine, Thoracic Spine- 06/25/22-07/10/22- Dr. Lynwood Shannon   History of radiation therapy    Left lung-07/29/23-08/11/23- Dr. 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   Substance abuse Albert Hardy)     Past Surgical History:  Procedure Laterality Date   BRONCHIAL BIOPSY  05/26/2022   Procedure: BRONCHIAL BIOPSIES;  Surgeon: Brenna Adine CROME, DO;  Location: [facility] ENDOSCOPY;  Service: Pulmonary;;   BRONCHIAL BRUSHINGS  05/26/2022   Procedure: BRONCHIAL BRUSHINGS;  Surgeon: Brenna Adine CROME, DO;  Location: [facility]  ENDOSCOPY;  Service: Pulmonary;;   NO PAST SURGERIES     VIDEO BRONCHOSCOPY  05/26/2022   Procedure: VIDEO BRONCHOSCOPY WITHOUT FLUORO;  Surgeon: Brenna Adine CROME, DO;  Location: [facility] ENDOSCOPY;  Service: Pulmonary;;    Family History  Problem Relation Age of Onset   Other Brother    Hypertension Brother    Hyperlipidemia Brother    Arthritis Mother    Hypertension Mother    Hyperlipidemia Mother    Colon cancer Mother        51's   Hypertension Sister    Hyperlipidemia Sister    Diabetes Sister    Heart attack Father    Hyperlipidemia Brother     Social History:  reports that he quit smoking about 11 years ago. His smoking use included cigarettes. He started smoking about 41 years ago. He has a 30 pack-year smoking history. He has never used smokeless tobacco. He reports current alcohol use. He reports current drug use. Frequency: 1.00 time per week. Drug: Marijuana.  Allergies:  Allergies  Allergen Reactions   Lipitor [Atorvastatin]     myalgia   Pravastatin Other (See Comments)    myalgia    Medications: I have reviewed the patient's current medications.  The PMH, PSH, Medications, Allergies, and SH were reviewed and updated.  ROS: Constitutional: Negative for fever, weight loss and weight gain. Cardiovascular: Negative for chest pain and dyspnea on exertion. Respiratory: Is not experiencing shortness of breath at rest. Gastrointestinal: Negative for nausea and vomiting. Neurological: Negative for headaches. Psychiatric: The patient is not nervous/anxious  There were no vitals taken for this visit. There is no height or weight on file to calculate BMI.  PHYSICAL EXAM:  Exam: General: Well-developed, well-nourished Communication and Voice: Clear pitch and clarity Respiratory Respiratory effort: Equal inspiration and expiration without stridor Cardiovascular Peripheral Vascular: Warm extremities with equal color/perfusion Eyes: No nystagmus with equal  extraocular motion bilaterally Neuro/Psych/Balance: Patient oriented to person, place, and time; Appropriate mood and affect; Gait is intact with no imbalance; Cranial nerves I-XII are intact Head and Face Inspection: Normocephalic and atraumatic without mass or lesion Palpation: Facial skeleton intact without bony stepoffs Salivary Glands: No mass or tenderness Facial Strength: Facial motility symmetric and full bilaterally ENT Pinna: External ear intact and fully developed External canal: Canal is patent with intact skin Tympanic Membrane: Clear and mobile External Nose: No scar or anatomic deformity Internal Nose: Septum is relatively straight slight deviation to the left. No polyp, or purulence. Mucosal edema and erythema present.  Bilateral inferior turbinate hypertrophy.  Lips, Teeth, and gums: Mucosa and teeth intact and viable TMJ: No pain to palpation with full mobility Oral cavity/oropharynx: No erythema or exudate, no lesions present Nasopharynx: No mass or lesion with intact mucosa Hypopharynx: Intact mucosa without pooling of secretions Larynx Glottic: Full true vocal cord mobility on the right and paralysis on the left, without lesion or mass See procedure note below  Supraglottic: Normal appearing epiglottis and AE folds Interarytenoid Space: Moderate pachydermia&edema Subglottic Space: Patent without lesion or edema Neck Neck and Trachea: Midline trachea without mass or lesion Thyroid :  No mass or nodularity Lymphatics: No lymphadenopathy  Procedure:  Preoperative diagnosis: hoarseness  Postoperative diagnosis:   same + L VF paralysis   Procedure: Flexible fiberoptic laryngoscopy with stroboscopy (68420)   Surgeon: Shiniqua Groseclose, MD  Anesthesia: Topical lidocaine  and Afrin  Complications: None  Condition is stable throughout exam  Indications and consent:   The patient presents to the clinic with hoarseness. All the risks, benefits, and potential  complications were reviewed with the patient preoperatively and informed verbal consent was obtained.  Procedure: The patient was seated upright in the exam chair.   Topical lidocaine  and Afrin were applied to the nasal cavity. After adequate anesthesia had occurred, the flexible telescope with strobe capabilities was passed into the nasal cavity. The nasopharynx was patent without mass or lesion. The scope was passed behind the soft palate and directed toward the base of tongue. The base of tongue was visualized and was symmetric with no apparent masses or abnormal appearing tissue. There were no signs of a mass or pooling of secretions in the piriform sinuses. The supraglottic structures were normal.  The true vocal cords are mobile on the right and immobile paralyzed on the left. The medial edges were bowed more on the left side. Closure was incomplete with supraglottic compression. Periodicity present. The mucosal wave and amplitude were intact. There is moderate interarytenoid pachydermia and post cricoid edema. The mucosa appears without lesions.   The laryngoscope was then slowly withdrawn and the patient tolerated the procedure well. There were no complications or blood loss.    Studies Reviewed: CT neck 09/08/23 IMPRESSION: 1. Findings compatible with left vocal cord paralysis, which is detailed above and likely is due to the left hilar mass and ap window adenopathy. No mass in the neck. 2. Please see same day CT chest for intrathoracic findings including left perihilar mass and mediastinal/hilar adenopathy.  CT Chest A/P 09/08/23 IMPRESSION: 1. Stable left perihilar mass extending into the left upper lobe. 2. Stable left hilar and mediastinal lymphadenopathy. 3. Stable osseous metastatic disease. 4. Stable tree-in-bud opacities and ill-defined ground-glass nodular opacities in the left upper lobe. 5. No acute localizing process in the abdomen or pelvis. 6. Large amount of stool  throughout the colon. 7. Aortic atherosclerosis.  Assessment/Plan: Encounter Diagnoses  Name Primary?   Complete paralysis of left vocal cord Yes   Squamous cell carcinoma of lung, stage IV, left (HCC)    Glottic insufficiency    Dysphonia    Age-related vocal fold atrophy    Hoarseness     Assessment and Plan Assessment & Plan Left vocal cord paralysis dysphonia and hx of left sided lung cancer stage IV Left vocal cord paralysis confirmed on scope exam, likely secondary to lung cancer. Symptoms include voice loss, and reduced cough strength. No aspiration pneumonia. Atrophic and bowed vocal cord suggests chronicity, but he experienced voice change 2 weeks ago. We discussed observation vs injection augmentation, and he would like to try it. Will consider medialization thyroplasty after 1 year.  - Schedule procedure for vocal cord injection augmentation - Advise bringing a ride for procedure.  Stage IV lung cancer  - continue to see Oncology/Palliative as scheduled      Thank you for allowing me to participate in the care of this patient. Please do not hesitate to contact me with any questions or concerns.   Elena Larry, MD Otolaryngology [facility] Phone: [phone] Fax: [phone]    09/16/2023, 3:21 PM

## 2023-09-21 ENCOUNTER — Other Ambulatory Visit

## 2023-09-21 ENCOUNTER — Ambulatory Visit: Admitting: Internal Medicine

## 2023-09-21 ENCOUNTER — Encounter

## 2023-09-21 ENCOUNTER — Ambulatory Visit

## 2023-09-23 ENCOUNTER — Other Ambulatory Visit: Payer: Self-pay | Admitting: Physician Assistant

## 2023-09-23 DIAGNOSIS — I2699 Other pulmonary embolism without acute cor pulmonale: Secondary | ICD-10-CM

## 2023-09-28 ENCOUNTER — Encounter

## 2023-09-28 ENCOUNTER — Inpatient Hospital Stay (HOSPITAL_BASED_OUTPATIENT_CLINIC_OR_DEPARTMENT_OTHER): Admitting: Internal Medicine

## 2023-09-28 ENCOUNTER — Inpatient Hospital Stay: Admitting: Nurse Practitioner

## 2023-09-28 ENCOUNTER — Inpatient Hospital Stay: Attending: Internal Medicine

## 2023-09-28 ENCOUNTER — Inpatient Hospital Stay: Admitting: Dietician

## 2023-09-28 ENCOUNTER — Inpatient Hospital Stay

## 2023-09-28 ENCOUNTER — Encounter: Payer: Self-pay | Admitting: Nurse Practitioner

## 2023-09-28 ENCOUNTER — Encounter: Admitting: Dietician

## 2023-09-28 VITALS — BP 116/66 | HR 95 | Temp 97.8°F | Resp 17 | Ht 69.0 in | Wt 125.0 lb

## 2023-09-28 DIAGNOSIS — Z5112 Encounter for antineoplastic immunotherapy: Secondary | ICD-10-CM | POA: Diagnosis present

## 2023-09-28 DIAGNOSIS — C3412 Malignant neoplasm of upper lobe, left bronchus or lung: Secondary | ICD-10-CM | POA: Insufficient documentation

## 2023-09-28 DIAGNOSIS — R630 Anorexia: Secondary | ICD-10-CM | POA: Diagnosis not present

## 2023-09-28 DIAGNOSIS — C3492 Malignant neoplasm of unspecified part of left bronchus or lung: Secondary | ICD-10-CM

## 2023-09-28 DIAGNOSIS — Z79620 Long term (current) use of immunosuppressive biologic: Secondary | ICD-10-CM | POA: Insufficient documentation

## 2023-09-28 DIAGNOSIS — Z515 Encounter for palliative care: Secondary | ICD-10-CM

## 2023-09-28 DIAGNOSIS — Z87891 Personal history of nicotine dependence: Secondary | ICD-10-CM | POA: Insufficient documentation

## 2023-09-28 DIAGNOSIS — R530 Neoplastic (malignant) related fatigue: Secondary | ICD-10-CM | POA: Diagnosis not present

## 2023-09-28 DIAGNOSIS — G893 Neoplasm related pain (acute) (chronic): Secondary | ICD-10-CM

## 2023-09-28 DIAGNOSIS — C7951 Secondary malignant neoplasm of bone: Secondary | ICD-10-CM | POA: Insufficient documentation

## 2023-09-28 LAB — TSH: TSH: 0.941 u[IU]/mL (ref 0.350–4.500)

## 2023-09-28 LAB — CMP (CANCER CENTER ONLY)
ALT: 21 U/L (ref 0–44)
AST: 18 U/L (ref 15–41)
Albumin: 3.9 g/dL (ref 3.5–5.0)
Alkaline Phosphatase: 63 U/L (ref 38–126)
Anion gap: 10 (ref 5–15)
BUN: 20 mg/dL (ref 8–23)
CO2: 26 mmol/L (ref 22–32)
Calcium: 9.1 mg/dL (ref 8.9–10.3)
Chloride: 101 mmol/L (ref 98–111)
Creatinine: 0.99 mg/dL (ref 0.61–1.24)
GFR, Estimated: >60 mL/min (ref >60)
Glucose, Bld: 149 mg/dL — ABNORMAL HIGH (ref 70–99)
Potassium: 3.8 mmol/L (ref 3.5–5.1)
Sodium: 137 mmol/L (ref 135–145)
Total Bilirubin: 0.3 mg/dL (ref 0.0–1.2)
Total Protein: 7.4 g/dL (ref 6.5–8.1)

## 2023-09-28 LAB — CBC WITH DIFFERENTIAL (CANCER CENTER ONLY)
Abs Immature Granulocytes: 0.01 K/uL (ref 0.00–0.07)
Basophils Absolute: 0 K/uL (ref 0.0–0.1)
Basophils Relative: 0 %
Eosinophils Absolute: 0 K/uL (ref 0.0–0.5)
Eosinophils Relative: 1 %
HCT: 32.9 % — ABNORMAL LOW (ref 39.0–52.0)
Hemoglobin: 11 g/dL — ABNORMAL LOW (ref 13.0–17.0)
Immature Granulocytes: 0 %
Lymphocytes Relative: 14 %
Lymphs Abs: 0.5 K/uL — ABNORMAL LOW (ref 0.7–4.0)
MCH: 29.6 pg (ref 26.0–34.0)
MCHC: 33.4 g/dL (ref 30.0–36.0)
MCV: 88.4 fL (ref 80.0–100.0)
Monocytes Absolute: 0.5 K/uL (ref 0.1–1.0)
Monocytes Relative: 14 %
Neutro Abs: 2.7 K/uL (ref 1.7–7.7)
Neutrophils Relative %: 71 %
Platelet Count: 229 K/uL (ref 150–400)
RBC: 3.72 MIL/uL — ABNORMAL LOW (ref 4.22–5.81)
RDW: 13.4 % (ref 11.5–15.5)
WBC Count: 3.9 K/uL — ABNORMAL LOW (ref 4.0–10.5)
nRBC: 0 % (ref 0.0–0.2)

## 2023-09-28 MED ORDER — OXYCODONE HCL 5 MG PO TABS: 5.0000 mg | ORAL_TABLET | ORAL | 0 refills | Status: DC | PRN

## 2023-09-28 MED ORDER — SODIUM CHLORIDE 0.9 % IV SOLN
350.0000 mg | Freq: Once | INTRAVENOUS | Status: AC
  Administered 2023-09-28: 350 mg via INTRAVENOUS
  Filled 2023-09-28: qty 7

## 2023-09-28 MED ORDER — HEPARIN SOD (PORK) LOCK FLUSH 100 UNIT/ML IV SOLN
500.0000 [IU] | Freq: Once | INTRAVENOUS | Status: DC | PRN
Start: 2023-09-28 — End: 2023-09-28

## 2023-09-28 MED ORDER — DENOSUMAB 120 MG/1.7ML ~~LOC~~ SOLN
120.0000 mg | Freq: Once | SUBCUTANEOUS | Status: AC
Start: 2023-09-28 — End: 2023-09-28
  Administered 2023-09-28: 120 mg via SUBCUTANEOUS
  Filled 2023-09-28: qty 1.7

## 2023-09-28 MED ORDER — SODIUM CHLORIDE 0.9% FLUSH: 10.0000 mL | INTRAVENOUS | Status: DC | PRN

## 2023-09-28 MED ORDER — MORPHINE SULFATE ER 15 MG PO TBCR: 15.0000 mg | EXTENDED_RELEASE_TABLET | Freq: Two times a day (BID) | ORAL | 0 refills | Status: DC

## 2023-09-28 MED ORDER — SODIUM CHLORIDE 0.9 % IV SOLN: Freq: Once | INTRAVENOUS | Status: AC

## 2023-09-28 NOTE — Progress Notes (Signed)
 [facility] Telephone:([phone]) [phone]   [phone]  OFFICE PROGRESS NOTE  [name], MD [phone] [place] [address]  DIAGNOSIS: Stage IV (T2a, N2, M1 B) non-small cell lung cancer, squamous cell carcinoma presented with left upper lobe lung mass in addition to AP window lymphadenopathy and suspicious bone metastasis to the T8 and L4 vertebrae in addition to retroperitoneal metastatic soft tissue nodule diagnosed in April 2024.   Molecular studies by [email or web address] showed no actionable mutations and PD-L1 expression of 70%.  PRIOR THERAPY:  1) Palliative radiotherapy to the T8 and L4 metastatic bone disease. 2) Systemic chemotherapy with carboplatin  for AUC of 5, paclitaxel  175 Mg/M2 and Libtayo  (Cempilimab) 350 Mg IV every 3 weeks with Neulasta  support for 4 cycles    CURRENT THERAPY: Maintenance treatment with single agent Libtayo  (Cempilimab) 350 Mg IV every 3 weeks.  First dose September 08, 2022.  Status post 17 cycles.  INTERVAL HISTORY: Albert Hardy. 68 y.o. male returns to the clinic today for follow-up visit. Discussed the use of AI scribe software for clinical note transcription with the patient, who gave verbal consent to proceed.  History of Present Illness Albert Hardy. is a 68 year old male with stage four non-small cell lung cancer who presents for evaluation before starting cycle number eighteen of maintenance treatment.  He was diagnosed with stage four non-small cell lung cancer, squamous cell carcinoma, in April 2024. There is no actionable mutation, and PD-L1 expression is seventy percent. Initially, he underwent palliative systemic chemo-immunotherapy with carboplatin , paclitaxel , and Libtayo  every three weeks for four cycles. Currently, he is on maintenance treatment with Libtayo  every three weeks.  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 An ENT consultation  has been completed, and a procedure involving a filler injection is scheduled for August 26.  He experienced a cough with blood, which he attributes to being on a blood thinner and having a bad cough. This was the first occurrence of seeing blood since last year. No nausea, vomiting, or diarrhea.      MEDICAL HISTORY: Past Medical History:  Diagnosis Date   Aortic regurgitation    Aortic stenosis 04/04/2018   Atherosclerosis of native arteries of the extremities with intermittent claudication 09/08/2013   Atherosclerosis of native artery of extremity with intermittent claudication (HCC) 09/08/2013   IMO SNOMED Dx Update Oct 2024     Bilateral impacted cerumen 10/13/2018   Bilateral sensorineural hearing loss 12/14/2018   Cancer (HCC)    lung   Dyspnea    Encounter for antineoplastic chemotherapy 08/18/2022   Encounter for antineoplastic immunotherapy 08/18/2022   Former moderate cigarette smoker (10-19 per day) 05/20/2012   Quit smoking Nov 2013 when hospitalized w/ HTN and chest pain(diagnosed w/ valvular heart disease)   GERD (gastroesophageal reflux disease)    Heart murmur    History of radiation therapy    Lumbar Spine, Thoracic Spine- 06/25/22-07/10/22- Dr. Lynwood Nasuti   History of radiation therapy    Left lung-07/29/23-08/11/23- Dr. 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  MEDICATIONS:  Current Outpatient Medications  Medication Sig Dispense Refill   albuterol  (VENTOLIN  HFA) 108 (90 Base) MCG/ACT inhaler Inhale 2 puffs into the lungs every 6 (six) hours as needed for wheezing or shortness of breath. 8 g 6   Ascorbic Acid  (VITAMIN C PO) Take 1 tablet by mouth daily.     benzonatate  (TESSALON ) 100 MG capsule Take 1 capsule (100 mg total) by mouth 3 (three) times daily as needed. 30 capsule 2   Cholecalciferol (VITAMIN D3) 1000 UNITS CAPS Take 1,000 Units by mouth daily.     Coenzyme Q10 (CO Q 10 PO) Take 1 tablet by mouth daily.     Cyanocobalamin (VITAMIN B-12 PO) Take 1 tablet by mouth daily.     cyclobenzaprine (FLEXERIL) 5 MG tablet Take 1 tablet (5 mg total) by mouth 3 (three) times daily as needed for muscle spasms. 30 tablet 0   ELIQUIS  5 MG TABS tablet Take 1 tablet by mouth twice daily 60 tablet 0   ezetimibe  (ZETIA ) 10 MG tablet Take 1 tablet by mouth once daily 90 tablet 0   fluticasone  (FLONASE ) 50 MCG/ACT nasal spray Place 1-2 sprays into both nostrils daily. 16 g 6   KRILL OIL PO Take 1 tablet by mouth daily.     levofloxacin  (LEVAQUIN ) 500 MG tablet Take 1 tablet (500 mg total) by mouth daily. 7 tablet 0   lisinopril -hydrochlorothiazide  (ZESTORETIC ) 10-12.5 MG tablet Take 1 tablet by mouth once daily 90 tablet 0   MAGNESIUM PO Take 1 tablet by mouth daily.     metoCLOPramide  (REGLAN ) 10 MG tablet Take 1 tablet (10 mg total) by mouth 4 (four) times daily -  before meals and at bedtime. 56 tablet 0   metoprolol  tartrate (LOPRESSOR ) 25 MG tablet Take 1 tablet (25 mg total) by mouth once for 1 dose. Please take this medication 2 hours before CT (Patient not taking: Reported on 09/16/2023) 1 tablet 0   morphine  (MS CONTIN ) 15 MG 12 hr tablet Take 1 tablet (15 mg total) by mouth every 12 (twelve) hours. 60 tablet 0   Multiple Vitamins-Minerals (CENTRUM SILVER PO) Take 1 tablet by mouth daily.     Multiple Vitamins-Minerals (ZINC PO) Take 1 tablet by mouth daily.     Naphazoline-Pheniramine (OPCON-A) 0.027-0.315 % SOLN Place 1 drop into both eyes daily as needed (redness).     nitroGLYCERIN  (NITROSTAT ) 0.4 MG SL tablet Place 1 tablet (0.4 mg total) under the tongue every 5 (five) minutes as needed for chest  pain. 25 tablet 1   omeprazole  (PRILOSEC) 20 MG capsule Take 1 capsule (20 mg total) by mouth daily. 30 capsule 0   omeprazole  (PRILOSEC) 20 MG capsule Take 1 capsule by mouth once daily (Patient not taking: Reported on 09/16/2023) 30 capsule 0   oxyCODONE  (OXY IR/ROXICODONE ) 5 MG immediate release tablet Take 1 tablet (5 mg total) by mouth every 4 (four) hours as needed for severe pain (pain score 7-10). 90 tablet 0   potassium chloride  SA (KLOR-CON  M) 20 MEQ tablet Take 1 tablet (20 mEq total) by mouth daily. 6 tablet 0   prochlorperazine  (COMPAZINE ) 10 MG tablet Take 1 tablet (10 mg total) by mouth every 6 (six) hours as needed for nausea or vomiting. 30 tablet 0   rosuvastatin  (CRESTOR ) 5 MG tablet Take 1 tablet (5 mg total) by mouth at bedtime. 90 tablet 1   sucralfate  (CARAFATE ) 1 g tablet Take 1  tablet (1 g total) by mouth 4 (four) times daily -  with meals and at bedtime. Crush and dissolve in 10 mL's of warm water prior to swallowing, take 20 min prior to meals 60 tablet 1   triamcinolone  0.1%-Eucerin equivalent 1:1 cream mixture Apply topically 3 (three) times daily as needed. 480 g 2   TURMERIC PO Take 1 tablet by mouth 3 (three) times a week.     umeclidinium-vilanterol (ANORO ELLIPTA ) 62.5-25 MCG/ACT AEPB Inhale 1 puff into the lungs daily. 180 each 3   No current facility-administered medications for this visit.    SURGICAL HISTORY:  Past Surgical History:  Procedure Laterality Date   BRONCHIAL BIOPSY  05/26/2022   Procedure: BRONCHIAL BIOPSIES;  Surgeon: Brenna Adine CROME, DO;  Location: [facility];  Service: Pulmonary;;   BRONCHIAL BRUSHINGS  05/26/2022   Procedure: BRONCHIAL BRUSHINGS;  Surgeon: Brenna Adine CROME, DO;  Location: [facility];  Service: Pulmonary;;   NO PAST SURGERIES     VIDEO BRONCHOSCOPY  05/26/2022   Procedure: VIDEO BRONCHOSCOPY WITHOUT FLUORO;  Surgeon: Brenna Adine CROME, DO;  Location: [facility];  Service: Pulmonary;;    REVIEW OF SYSTEMS:   Constitutional: negative Eyes: negative Ears, nose, mouth, throat, and face: positive for hoarseness Respiratory: negative Cardiovascular: negative Gastrointestinal: negative Genitourinary:negative Integument/breast: negative Hematologic/lymphatic: negative Musculoskeletal:negative Neurological: negative Behavioral/Psych: negative Endocrine: negative Allergic/Immunologic: negative   PHYSICAL EXAMINATION: General appearance: alert, cooperative, and no distress Head: Normocephalic, without obvious abnormality, atraumatic Neck: no adenopathy, no JVD, supple, symmetrical, trachea midline, and thyroid  not enlarged, symmetric, no tenderness/mass/nodules Lymph nodes: Cervical, supraclavicular, and axillary nodes normal. Resp: clear to auscultation bilaterally Back: symmetric, no curvature. ROM normal. No CVA tenderness. Cardio: regular rate and rhythm, S1, S2 normal, no murmur, click, rub or gallop GI: soft, non-tender; bowel sounds normal; no masses,  no organomegaly Extremities: extremities normal, atraumatic, no cyanosis or edema Neurologic: Alert and oriented X 3, normal strength and tone. Normal symmetric reflexes. Normal coordination and gait  ECOG PERFORMANCE STATUS: 1 - Symptomatic but completely ambulatory  Blood pressure 116/66, pulse 95, temperature 97.8 F (36.6 C), temperature source Temporal, resp. rate 17, height 5' 9 (1.753 m), weight 125 lb (56.7 kg), SpO2 100%.  LABORATORY DATA: Lab Results  Component Value Date   WBC 3.9 (L) 09/28/2023   HGB 11.0 (L) 09/28/2023   HCT 32.9 (L) 09/28/2023   MCV 88.4 09/28/2023   PLT 229 09/28/2023      Chemistry      Component Value Date/Time   NA 137 09/28/2023 0832   NA 146 (H) 01/05/2023 1114   K 3.8 09/28/2023 0832   CL 101 09/28/2023 0832   CO2 26 09/28/2023 0832   BUN 20 09/28/2023 0832   BUN 11 01/05/2023 1114   CREATININE 0.99 09/28/2023 0832   CREATININE 1.07 08/27/2014 1511      Component Value Date/Time    CALCIUM  9.1 09/28/2023 0832   ALKPHOS 63 09/28/2023 0832   AST 18 09/28/2023 0832   ALT 21 09/28/2023 0832   BILITOT 0.3 09/28/2023 0832       RADIOGRAPHIC STUDIES: CT Soft Tissue Neck W Contrast Result Date: 09/08/2023 CLINICAL DATA:  Occult malignancy Increased hoarseness with lung cancer EXAM: CT NECK WITH CONTRAST TECHNIQUE: Multidetector CT imaging of the neck was performed using the standard protocol following the bolus administration of intravenous contrast. RADIATION DOSE REDUCTION: This exam was performed according to the departmental dose-optimization program which includes automated exposure control, adjustment of the mA and/or kV  according to patient size and/or use of iterative reconstruction technique. CONTRAST:  OMNIPAQUE  IOHEXOL  300 MG/ML  SOLN COMPARISON:  None Available. FINDINGS: Pharynx and larynx: Thickening & medial rotation of left aryepiglottic fold with dilated left piriform sinus and laryngeal ventricle, compatible with left vocal cord paralysis. No mass or edema. Salivary glands: No inflammation, mass, or stone. Thyroid : Normal. Lymph nodes: No adenopathy in the neck. Vascular: No obvious abnormality although evaluation is limited without arterial contrast bolus timing. Limited intracranial: Negative. Visualized orbits: Negative. Mastoids and visualized paranasal sinuses: Clear. Skeleton: No acute or aggressive process. Upper chest: Please see same day CT chest for intrathoracic findings including left perihilar mass and mediastinal/hilar adenopathy. IMPRESSION: 1. Findings compatible with left vocal cord paralysis, which is detailed above and likely is due to the left hilar mass and ap window adenopathy. No mass in the neck. 2. Please see same day CT chest for intrathoracic findings including left perihilar mass and mediastinal/hilar adenopathy. Electronically Signed   By: Gilmore GORMAN Molt M.D.   On: 09/08/2023 20:58   CT CHEST ABDOMEN PELVIS W CONTRAST Result Date:  09/08/2023 CLINICAL DATA:  Non-small cell lung cancer metastatic evaluation. Hemoptysis and hoarseness. EXAM: CT CHEST, ABDOMEN, AND PELVIS WITH CONTRAST TECHNIQUE: Multidetector CT imaging of the chest, abdomen and pelvis was performed following the standard protocol during bolus administration of intravenous contrast. RADIATION DOSE REDUCTION: This exam was performed according to the departmental dose-optimization program which includes automated exposure control, adjustment of the mA and/or kV according to patient size and/or use of iterative reconstruction technique. CONTRAST:  OMNIPAQUE  IOHEXOL  300 MG/ML  SOLN COMPARISON:  CT chest abdomen and pelvis 07/09/2023. FINDINGS: CT CHEST FINDINGS Cardiovascular: No significant vascular findings. Normal heart size. No pericardial effusion. There are atherosclerotic calcifications of the aorta. Mediastinum/Nodes: Enlarged left hilar lymph nodes measure up to 2.7 x 2.1 cm, unchanged. Enlarged precarinal lymph node measures 1.5 by 2.7 cm, unchanged. Visualized esophagus and thyroid  gland are within normal limits. Lungs/Pleura: Left perihilar mass extending into the left upper lobes appears unchanged measuring 2.8 x 1.9 by 3.0 cm. Borders are spiculated, unchanged. Scattered tree-in-bud opacities and ill-defined ground-glass nodular opacities in the left upper lobe persist. There is cutoff of left upper lobe bronchus, unchanged from prior. There is some plugging of bronchi in the region of the lingula, unchanged. Trachea and central airways are patent. Musculoskeletal: T8 metastatic lesion appears unchanged. No acute fractures are seen. CT ABDOMEN PELVIS FINDINGS Hepatobiliary: No focal liver abnormality is seen. No gallstones, gallbladder wall thickening, or biliary dilatation. Pancreas: Unremarkable. No pancreatic ductal dilatation or surrounding inflammatory changes. Spleen: Normal in size without focal abnormality. Adrenals/Urinary Tract: Is subcentimeter  hypodense area in the posterior left kidney is unchanged, possibly a cyst or hyperdense cyst. Otherwise, the adrenal glands, kidneys and bladder are within normal limits. Stomach/Bowel: Stomach is within normal limits. Appendix appears normal. No evidence of bowel wall thickening, distention, or inflammatory changes. There is a large amount of stool throughout the colon. Vascular/Lymphatic: Aortic atherosclerosis. No enlarged abdominal or pelvic lymph nodes. Reproductive: Prostate gland is enlarged. Other: No abdominal wall hernia or abnormality. No abdominopelvic ascites. Musculoskeletal: L4 metastatic lesion appears unchanged. Sclerotic lesion in the right iliac bone is unchanged acute fractures are seen. IMPRESSION: 1. Stable left perihilar mass extending into the left upper lobe. 2. Stable left hilar and mediastinal lymphadenopathy. 3. Stable osseous metastatic disease. 4. Stable tree-in-bud opacities and ill-defined ground-glass nodular opacities in the left upper lobe. 5. No acute localizing  process in the abdomen or pelvis. 6. Large amount of stool throughout the colon. 7. Aortic atherosclerosis. Aortic Atherosclerosis (ICD10-I70.0). Electronically Signed   By: Greig Pique M.D.   On: 09/08/2023 20:44     ASSESSMENT AND PLAN: This is a very pleasant 68 years old African-American male with Stage IV (T2a, N2, M1b) non-small cell lung cancer, squamous cell carcinoma presented with left upper lobe lung mass in addition to AP window lymphadenopathy and suspicious bone metastasis to the T8 and L4 vertebrae as well as right retroperitoneal metastatic nodule diagnosed in April 2024.  Molecular studies by [email or web address] showed no actionable mutations and PD-L1 expression of 70%. The patient underwent palliative combination of systemic chemotherapy with carboplatin  for AUC of 5, paclitaxel  175 Mg/M2 and immunotherapy with Libtayo  (Cempilimab) 350 Mg IV every 3 weeks with Neulasta  support for 4 cycles followed by  maintenance treatment with Libtayo  (Cempilimab) of the patient has no disease progression after cycle #4.  He is status post 4 cycles. He is currently undergoing maintenance treatment with single agent Libtayo  (Cempilimab) 350 Mg IV every 3 weeks status post 17 cycles.  He has been tolerating this treatment fairly well. He is also status post palliative radiotherapy to the left hilar mass under the care of Dr. Shannon completed August 11, 2023. Assessment and Plan Assessment & Plan Stage IV non-small cell lung cancer, squamous cell carcinoma Stage IV non-small cell lung cancer, squamous cell carcinoma, diagnosed in April 2024. No actionable mutation and PD-L1 expression of 70%. Currently on maintenance treatment with Libtayo  every three weeks, status post seventeen cycles. Recent imaging shows well-managed disease with no evidence of growth or invasion of blood vessels. Continues to tolerate treatment well without nausea, vomiting, or diarrhea. - Continue Libtayo  maintenance therapy every three weeks. - Provide copy of recent scans to him.  Cough due to airway irritation on anticoagulation Cough likely due to airway irritation exacerbated by anticoagulation therapy. Recent episode of hemoptysis attributed to coughing while on blood thinners. No evidence of tumor growth or invasion causing the cough. - Recommend use of cough suppressant to manage cough and prevent irritation. For the back pain he underwent palliative radiotherapy to the bone disease at T8 and L4 vertebral lesions. For the bone metastasis, he is currently on treatment with Xgeva  120 mg subcutaneously every 6 weeks. The patient was advised to call immediately if he has any concerning symptoms in the interval. The patient voices understanding of current disease status and treatment options and is in agreement with the current care plan.  All questions were answered. The patient knows to call the clinic with any problems, questions or  concerns. We can certainly see the patient much sooner if necessary.  The total time spent in the appointment was 30 minutes.  Disclaimer: This note was dictated with voice recognition software. Similar sounding words can inadvertently be transcribed and may not be corrected upon review.

## 2023-09-28 NOTE — Progress Notes (Signed)
 Nutrition Follow-up:  Pt with stage IV SCC of left lung to bone. He is currently receiving maintenance Libtayo  q21d. Patient is under the care of Dr. Sherrod.   Met with patient in infusion. He reports doing well today. Patient is frustrated with his voice secondary to left vocal cord paralysis. He has been evaluated by ENT. Planning for vocal cord injection augmentation under the care of Dr. Soldatova. Patient reports speaking strains voice further and consuming his energy. Patient endorses a good appetite. Tolerating regular textures without difficulty. Has noticed needing to slow down with some foods. He is drinking premier. Patient enjoys the white chocolate almond flavor. He reports trying to eat a bedtime snack as well has eating ice cream to aid with wt gain.    Medications: reivewed   Labs: glucose 149  Anthropometrics: Pt 125 lb today - stable   7/31 - 126 lb 3.2 oz 7/8 - 125 lb 9.6 oz 6/12 - 129 lb 12.8 oz    NUTRITION DIAGNOSIS: Unintended wt loss - stable    INTERVENTION:  Continue strategies for increasing calories and protein - high calorie shake recipes + Ensure recipes provided  Suggested adding gravy/sauces to foods for ease of intake Continue ONS - recommend daily Ensure Complete/equivalent to support wt gain - samples + coupons provided     MONITORING, EVALUATION, GOAL: wt trends, intake   NEXT VISIT: Wednesday October 1 during infusion

## 2023-09-28 NOTE — Patient Instructions (Signed)
 [facility] - A DEPT OF [facility]  Discharge Instructions: Thank you for choosing [facility] to provide your oncology and hematology care.   If you have a lab appointment with the Cancer Center, please go directly to the Cancer Center and check in at the registration area.   Wear comfortable clothing and clothing appropriate for easy access to any Portacath or PICC line.   We strive to give you quality time with your provider. You may need to reschedule your appointment if you arrive late (15 or more minutes).  Arriving late affects you and other patients whose appointments are after yours.  Also, if you miss three or more appointments without notifying the office, you may be dismissed from the clinic at the provider's discretion.      For prescription refill requests, have your pharmacy contact our office and allow 72 hours for refills to be completed.    Today you received the following chemotherapy and/or immunotherapy agents Libtayo  To help prevent nausea and vomiting after your treatment, we encourage you to take your nausea medication as directed.  BELOW ARE SYMPTOMS THAT SHOULD BE REPORTED IMMEDIATELY: *FEVER GREATER THAN 100.4 F (38 C) OR HIGHER *CHILLS OR SWEATING *NAUSEA AND VOMITING THAT IS NOT CONTROLLED WITH YOUR NAUSEA MEDICATION *UNUSUAL SHORTNESS OF BREATH *UNUSUAL BRUISING OR BLEEDING *URINARY PROBLEMS (pain or burning when urinating, or frequent urination) *BOWEL PROBLEMS (unusual diarrhea, constipation, pain near the anus) TENDERNESS IN MOUTH AND THROAT WITH OR WITHOUT PRESENCE OF ULCERS (sore throat, sores in mouth, or a toothache) UNUSUAL RASH, SWELLING OR PAIN  UNUSUAL VAGINAL DISCHARGE OR ITCHING   Items with * indicate a potential emergency and should be followed up as soon as possible or go to the Emergency Department if any problems should occur.  Please show the CHEMOTHERAPY ALERT CARD or IMMUNOTHERAPY ALERT  CARD at check-in to the Emergency Department and triage nurse.  Should you have questions after your visit or need to cancel or reschedule your appointment, please contact [facility] - [facility] DE[facility]  Dept: [phone]  and follow the prompts.  Office hours are 8:00 a.m. to 4:30 p.m. Monday - Friday. Please note that voicemails left after 4:00 p.m. may not be returned until the following business day.  We are closed weekends and major holidays. You have access to a nurse at all times for urgent questions. Please call the main number to the clinic Dept: [phone] and follow the prompts.   For any non-urgent questions, you may also contact your provider using MyChart. We now offer e-Visits for anyone 59 and older to request care online for non-urgent symptoms. For details visit [email or web address].   Also download the MyChart app! Go to the app store, search MyChart, open the app, select [place], and log in with your MyChart username and password.

## 2023-09-28 NOTE — Progress Notes (Signed)
 Palliative Medicine [facility]  [phone] Fax:([phone]) [phone]   Name: Albert Hardy. Date: 09/28/2023 MRN: [id number]  DOB: 06/28/1955  Patient Care Team: [name], MD as PCP - General (Family Medicine)    INTERVAL HISTORY: Albert Hardy. is a 68 y.o. 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 Palliative ask to see for symptom management and goals of care   SOCIAL HISTORY:     reports that he quit smoking about 11 years ago. His smoking use included cigarettes. He started smoking about 41 years ago. He has a 30 pack-year smoking history. He has never used smokeless tobacco. He reports current alcohol use. He reports current drug use. Frequency: 1.00 time per week. Drug: Marijuana.  ADVANCE DIRECTIVES:  None on file  CODE STATUS: Full code  PAST MEDICAL HISTORY: Past Medical History:  Diagnosis Date   Aortic regurgitation    Aortic stenosis 04/04/2018   Atherosclerosis of native arteries of the extremities with intermittent claudication 09/08/2013   Atherosclerosis of native artery of extremity with intermittent claudication (HCC) 09/08/2013   IMO SNOMED Dx Update Oct 2024     Bilateral impacted cerumen 10/13/2018   Bilateral sensorineural hearing loss 12/14/2018   Cancer (HCC)    lung   Dyspnea    Encounter for antineoplastic chemotherapy 08/18/2022   Encounter for antineoplastic immunotherapy 08/18/2022   Former moderate cigarette smoker (10-19 per day) 05/20/2012   Quit smoking Nov 2013 when hospitalized w/ HTN and chest pain(diagnosed w/ valvular heart disease)   GERD (gastroesophageal reflux disease)    Heart murmur    History of radiation therapy    Lumbar Spine, Thoracic Spine- 06/25/22-07/10/22- Dr. Lynwood Nasuti   History of radiation therapy    Left lung-07/29/23-08/11/23- Dr. 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  MEDICATIONS:  Current Outpatient Medications  Medication Sig Dispense Refill   albuterol  (VENTOLIN  HFA) 108 (90 Base) MCG/ACT inhaler Inhale 2 puffs into the lungs every 6 (six) hours as needed for wheezing or shortness of breath. 8 g 6   Ascorbic Acid (VITAMIN C PO) Take 1 tablet by mouth daily.     benzonatate  (TESSALON ) 100 MG capsule Take 1 capsule (100 mg total) by mouth 3 (three) times daily as needed. 30 capsule 2   Cholecalciferol (VITAMIN D3) 1000 UNITS CAPS Take 1,000 Units by mouth daily.     Coenzyme Q10 (CO Q 10 PO) Take 1 tablet by mouth daily.     Cyanocobalamin (VITAMIN B-12 PO) Take 1 tablet by mouth daily.     cyclobenzaprine (FLEXERIL) 5 MG tablet Take 1 tablet (5 mg total) by mouth 3 (three) times daily as needed for muscle spasms. 30 tablet 0   ELIQUIS  5 MG TABS tablet Take 1 tablet by mouth twice daily 60 tablet 0   ezetimibe  (ZETIA ) 10 MG tablet Take 1 tablet by mouth once daily 90 tablet 0   fluticasone  (FLONASE ) 50 MCG/ACT nasal spray Place 1-2 sprays into both nostrils daily. 16 g 6   KRILL OIL PO Take 1  tablet by mouth daily.     levofloxacin  (LEVAQUIN ) 500 MG tablet Take 1 tablet (500 mg total) by mouth daily. 7 tablet 0   lisinopril -hydrochlorothiazide  (ZESTORETIC ) 10-12.5 MG tablet Take 1 tablet by mouth once daily 90 tablet 0   MAGNESIUM PO Take 1 tablet by mouth daily.     metoCLOPramide  (REGLAN ) 10 MG tablet Take 1 tablet (10 mg total) by mouth 4 (four) times daily -  before meals and at bedtime. 56 tablet 0   metoprolol  tartrate (LOPRESSOR ) 25 MG tablet Take 1 tablet (25 mg total) by mouth once for 1 dose. Please take this medication 2 hours before  CT (Patient not taking: Reported on 09/16/2023) 1 tablet 0   morphine  (MS CONTIN ) 15 MG 12 hr tablet Take 1 tablet (15 mg total) by mouth every 12 (twelve) hours. 60 tablet 0   Multiple Vitamins-Minerals (CENTRUM SILVER PO) Take 1 tablet by mouth daily.     Multiple Vitamins-Minerals (ZINC PO) Take 1 tablet by mouth daily.     Naphazoline-Pheniramine (OPCON-A) 0.027-0.315 % SOLN Place 1 drop into both eyes daily as needed (redness).     nitroGLYCERIN  (NITROSTAT ) 0.4 MG SL tablet Place 1 tablet (0.4 mg total) under the tongue every 5 (five) minutes as needed for chest pain. 25 tablet 1   omeprazole  (PRILOSEC) 20 MG capsule Take 1 capsule (20 mg total) by mouth daily. 30 capsule 0   omeprazole  (PRILOSEC) 20 MG capsule Take 1 capsule by mouth once daily (Patient not taking: Reported on 09/16/2023) 30 capsule 0   oxyCODONE  (OXY IR/ROXICODONE ) 5 MG immediate release tablet Take 1 tablet (5 mg total) by mouth every 4 (four) hours as needed for severe pain (pain score 7-10). 90 tablet 0   potassium chloride  SA (KLOR-CON  M) 20 MEQ tablet Take 1 tablet (20 mEq total) by mouth daily. 6 tablet 0   prochlorperazine  (COMPAZINE ) 10 MG tablet Take 1 tablet (10 mg total) by mouth every 6 (six) hours as needed for nausea or vomiting. 30 tablet 0   rosuvastatin  (CRESTOR ) 5 MG tablet Take 1 tablet (5 mg total) by mouth at bedtime. 90 tablet 1   sucralfate  (CARAFATE ) 1 g tablet Take 1 tablet (1 g total) by mouth 4 (four) times daily -  with meals and at bedtime. Crush and dissolve in 10 mL's of warm water prior to swallowing, take 20 min prior to meals 60 tablet 1   triamcinolone  0.1%-Eucerin equivalent 1:1 cream mixture Apply topically 3 (three) times daily as needed. 480 g 2   TURMERIC PO Take 1 tablet by mouth 3 (three) times a week.     umeclidinium-vilanterol (ANORO ELLIPTA ) 62.5-25 MCG/ACT AEPB Inhale 1 puff into the lungs daily. 180 each 3   No current facility-administered medications for this visit.    Facility-Administered Medications Ordered in Other Visits  Medication Dose Route Frequency Provider Last Rate Last Admin   heparin  lock flush 100 unit/mL  500 Units Intracatheter Once PRN Sherrod Sherrod, MD       sodium chloride  flush (NS) 0.9 % injection 10 mL  10 mL Intracatheter PRN Sherrod Sherrod, MD        VITAL SIGNS: There were no vitals taken for this visit. There were no vitals filed for this visit.  Estimated body mass index is 18.46 kg/m as calculated from the following:   Height as of an earlier encounter on 09/28/23: 5' 9 (1.753 m).   Weight as of an earlier encounter on 09/28/23: 125 lb (56.7  kg).     Latest Ref Rng & Units 09/28/2023    8:32 AM 09/07/2023    8:40 AM 08/17/2023    8:34 AM  CBC  WBC 4.0 - 10.5 K/uL 3.9  4.7  4.2   Hemoglobin 13.0 - 17.0 g/dL 88.9  87.9  87.9   Hematocrit 39.0 - 52.0 % 32.9  35.3  36.6   Platelets 150 - 400 K/uL 229  220  240        Latest Ref Rng & Units 09/28/2023    8:32 AM 09/07/2023    8:40 AM 08/17/2023    8:34 AM  CMP  Glucose 70 - 99 mg/dL 850  883  899   BUN 8 - 23 mg/dL 20  30  19    Creatinine 0.61 - 1.24 mg/dL 9.00  8.93  8.95   Sodium 135 - 145 mmol/L 137  136  139   Potassium 3.5 - 5.1 mmol/L 3.8  3.9  3.4   Chloride 98 - 111 mmol/L 101  100  102   CO2 22 - 32 mmol/L 26  27  25    Calcium  8.9 - 10.3 mg/dL 9.1  9.2  9.2   Total Protein 6.5 - 8.1 g/dL 7.4  7.9  7.6   Total Bilirubin 0.0 - 1.2 mg/dL 0.3  0.3  0.3   Alkaline Phos 38 - 126 U/L 63  65  65   AST 15 - 41 U/L 18  20  18    ALT 0 - 44 U/L 21  27  24      PERFORMANCE STATUS (ECOG) : 1 - Symptomatic but completely ambulatory   Physical Exam General: NAD Cardiovascular: RRR Pulmonary: normal breathing pattern  Extremities: no edema, no joint deformities Skin: no rashes Neurological: AAO x4  IMPRESSION: Discussed the use of AI scribe software for clinical note transcription with the patient, who gave verbal consent to proceed.  History of Present  Illness Albert Hardy. is a 68 year old male who was seen during infusion for symptom management follow-up. No acute distress. Denies nausea, vomiting, constipation, or diarrhea. He is concerned about low energy levels and is unsure if he is experiencing depression. He spends a lot of time watching TV and is becoming tired of it. He is processing his situation and taking things 'one day at a time.'   He has a decreased appetite but is managing his pain well with medication. Constipation remains a challenge, described as 'still an adventure', but it has become more steady over time.  His weight is stable at 125 pounds.  He experiences paralysis of his vocal cords, which he acknowledges as part of the progression of his condition.  I created space and opportunity for patient to express his emotions regarding current illness and changes in his overall condition.  He is realistic in his understanding and open and acknowledging that his cancer is terminal.  He continues to remain hopeful for stability taking life one day at a time.  We discussed signs and ways to manage any feelings of depression or anxiety.  Patient knows to contact office at any time he feels that we need to further discuss and or consider antidepressants.  Albert Hardy reports his pain is well controlled on current regimen. He is currently taking MS Contin  15mg  every 12 hours. Does not require breakthrough medication. No adjustments to regimen at this time.   We will continue to closely follow and support. All questions answered and support provided.  Goals of Care  06/18/22-  We discussed his current illness and what it means in the larger context of hsi on-going co-morbidities. Natural disease trajectory and expectations were discussed.  Albert Hardy and his wife are realistic in their expectations and understanding of his incurable cancer. He knows all treatment is palliative focused. Wishes to continue taking things one day at a time  focusing on his quality of life allowing him every opportunity to continue to thrive.   We discussed Her current illness and what it means in the larger context of Her on-going co-morbidities. Natural disease trajectory and expectations were discussed.  I discussed the importance of continued conversation with family and their medical providers regarding overall plan of care and treatment options, ensuring decisions are within the context of the patients values and GOCs. Assessment & Plan Appetite fluctuations and weight loss Intermittent appetite fluctuations with associated weight loss.  Weight is stable at 125 pounds.  Appetite is influenced by stomach discomfort and food aversions. Encouraged by weight gain but experiences frustration with fluctuations. - Encourage consumption of small, frequent meals - Advise trying softer foods like eggs on days with poor appetite - Reinforce the importance of maintaining nutritional intake  Constipation Constipation remains an ongoing issue, described as an 'adventure' and 'rough at times'. The condition is more steady but still challenging. - Continue daily stool softener as needed for constipation.  Chronic cancer related pain management Pain management is well-controlled with current medications. - Continue MS Contin  15 mg every 12 hours. - Patient is not requiring breakthrough medication around the clock at this time. - Continue oxycodone  5 mg every 4 hours as needed.  Possible depression Expresses concern about possible depression, noting low energy levels and a lack of interest in activities. He is processing his situation and is considering increasing activity levels by walking. No decision has been made regarding depression treatment. - Encourage communication if feelings of depression or anxiety arise.   I will plan to follow-up with patient in 4-6 weeks. Sooner if needed.  Patient expressed understanding and was in agreement with this  plan. He also understands that He can call the clinic at any time with any questions, concerns, or complaints.   Any controlled substances utilized were prescribed in the context of palliative care. PDMP has been reviewed.   Visit consisted of counseling and education dealing with the complex and emotionally intense issues of symptom management and palliative care in the setting of serious and potentially life-threatening illness.  Levon Borer, AGPCNP-BC  Palliative Medicine Team/[facility]

## 2023-09-28 NOTE — Addendum Note (Signed)
 Addended by: PICKENPACK-COUSAR, Kenzi Bardwell N on: 09/28/2023 12:59 PM   Modules accepted: Orders

## 2023-09-29 LAB — T4: T4, Total: 7.7 ug/dL (ref 4.5–12.0)

## 2023-10-02 ENCOUNTER — Other Ambulatory Visit: Payer: Self-pay | Admitting: Family Medicine

## 2023-10-02 DIAGNOSIS — K219 Gastro-esophageal reflux disease without esophagitis: Secondary | ICD-10-CM

## 2023-10-02 DIAGNOSIS — I10 Essential (primary) hypertension: Secondary | ICD-10-CM

## 2023-10-05 ENCOUNTER — Ambulatory Visit (INDEPENDENT_AMBULATORY_CARE_PROVIDER_SITE_OTHER): Admitting: Otolaryngology

## 2023-10-05 ENCOUNTER — Encounter (INDEPENDENT_AMBULATORY_CARE_PROVIDER_SITE_OTHER): Payer: Self-pay | Admitting: Otolaryngology

## 2023-10-05 VITALS — BP 95/65 | HR 103

## 2023-10-05 DIAGNOSIS — J383 Other diseases of vocal cords: Secondary | ICD-10-CM | POA: Diagnosis not present

## 2023-10-05 DIAGNOSIS — R490 Dysphonia: Secondary | ICD-10-CM

## 2023-10-05 DIAGNOSIS — J3801 Paralysis of vocal cords and larynx, unilateral: Secondary | ICD-10-CM | POA: Diagnosis not present

## 2023-10-05 NOTE — Progress Notes (Signed)
 Therapeutic Vocal Cord Injection CPT 68425-49 ATTENDING: Elena Larry, MD   PREOPERATIVE DIAGNOSIS(ES): 1. left vocal cord paralysis 2. Hoarseness; atrophy bilateral 3. Glottic insufficiency  POSTOPERATIVE DIAGNOSIS(ES): Same  PROCEDURE PERFORMED: Laryngoscopy with restylane injection into the bilateral lateral thyroarytenoid muscle(s)  INDICATIONS FOR PROCEDURE: The risks and benefits of the surgical procedure have been explained in detail to the patient and they have elected to proceed.  CONSENT:  Informed consent was obtained prior to the procedure after discussion of risks, benefits, and alternatives and expected outcomes were discussed with the patient; consent placed in chart. The possibilities of reaction to medication, pulmonary aspiration, bleeding, infection, the need for additional procedures, failure to diagnose a condition, and creating a complication requiring transfusion or operation were discussed with the patient. The patient concurred with the proposed plan, giving informed consent.    UNIVERSAL PROTOCOL/ TIMEOUT: Preprocedure verification is complete- patient verified and consents confirmed.  ANESTHESIA: local anesthesia H&P REVIEW: The patient's history and physical were reviewed today prior to procedure. All medications were reviewed and updated as well.  PROCEDURE DETAILS:  The patient was brought to the clinic and placed in a seated position.  The anterior neck skin was then cleansed with alcohol and 1% Lidocaine  was used to infiltrate the skin overlying the thyrohyoid membrane. Patient had a NEB with 4% lidocaine  prior the start of the procedure, to ensure good anesthesia and procedure tolerance. Afrin/Lidocaine  mixture was then used to anesthetize the nasal passages. The 1.5-inch 25-gauge needle was bent with two gentle curves in same direction.  The flexible laryngoscope was then passed through the patient's nasal passageway and advanced into the larynx. The  nasopharynx was free of any lesions. The scope was passed behind the soft palate and directed toward the base of tongue. The supraglottic structures were normal in appearance. The true folds show atrophy and left vocal fold paralysis. The 23-gauge needle was passed through the petiole and 4% lidocaine  was dripped on the cords while the patient phonated. It was then attached to a luer-lock syringe filled with the filler and advanced into the vocal fold, and injection was performed into the bilateral thyroarytenoid muscle(s) at a site just anterior to the vocal process. A second injection was performed at mid-fold. The augmentation carried out until some overmedialization was noted. This was then repeated on the contralateral side. The needle was then removed from the vocal fold and the airway. The scope was removed from the nasal cavity. This completed the procedure. The patient tolerated the procedure well. IMPLANTS: Restylane-L Hyaluronic Acid  ESTIMATED BLOOD LOSS: None  SPECIMEN(S) REMOVED: None  DISPOSITION OF SPECIMEN(S): NA.  FINDINGS:  No evidence of a hematoma and the airway remained patent  CONDITION: Stable  COMPLICATIONS:The patient tolerated the procedure well without apparent complications and was ambulatory.   PLAN: RTC 3 weeks for repeat videostrobe

## 2023-10-05 NOTE — Progress Notes (Signed)
   Procedure Note  Patient: Albert Hardy.             Date of Birth: 01-Jan-1956           MRN: [id number]             Visit Date: 10/05/2023  Procedures: Procedures

## 2023-10-06 ENCOUNTER — Telehealth (INDEPENDENT_AMBULATORY_CARE_PROVIDER_SITE_OTHER): Payer: Self-pay

## 2023-10-06 ENCOUNTER — Telehealth (INDEPENDENT_AMBULATORY_CARE_PROVIDER_SITE_OTHER): Payer: Self-pay | Admitting: Otolaryngology

## 2023-10-06 ENCOUNTER — Other Ambulatory Visit (INDEPENDENT_AMBULATORY_CARE_PROVIDER_SITE_OTHER): Payer: Self-pay | Admitting: Otolaryngology

## 2023-10-06 MED ORDER — METHYLPREDNISOLONE 4 MG PO TBPK: ORAL_TABLET | ORAL | 1 refills | Status: DC

## 2023-10-06 NOTE — Telephone Encounter (Signed)
 Called patient's wife and the patient. I explained that slight shortness of breath can be normal. This is because the vocal cords are swollen from the injection. I explained that a prescription was called in and the instructions for taking it. Asked for patient to give us  a call back.

## 2023-10-14 ENCOUNTER — Telehealth: Payer: Self-pay

## 2023-10-14 NOTE — Progress Notes (Signed)
 [facility] OFFICE PROGRESS NOTE  [name], MD [phone] [place] [address]  DIAGNOSIS: Stage IV (T2a, N2, M1 B) non-small cell lung cancer, squamous cell carcinoma presented with left upper lobe lung mass in addition to AP window lymphadenopathy and suspicious bone metastasis to the T8 and L4 vertebrae in addition to retroperitoneal metastatic soft tissue nodule diagnosed in April 2024.    Molecular studies by [email or web address] showed no actionable mutations and PD-L1 expression of 70%.  PRIOR THERAPY: 1) Palliative radiotherapy to the T8 and L4 metastatic bone disease. 2) Systemic chemotherapy with carboplatin  for AUC of 5, paclitaxel  175 Mg/M2 and Libtayo  (Cempilimab) 350 Mg IV every 3 weeks with Neulasta  support for 4 cycles  3) Palliative radiation to the left hilar mass under the care of Dr. Shannon. Last dose on 08/11/23.   CURRENT THERAPY: 1) Maintenance treatment with single agent Libtayo  (Cempilimab) 350 Mg IV every 3 weeks.  First dose September 08, 2022.  Status post 18 cycles.  2) Xgeva  every 6 weeks  INTERVAL HISTORY: [name] 68 y.o. male returns to the clinic today for a follow-up visit. He completed chemotherapy/immunotherpay as well as palliative radiation to the bones. He is currently on maintenance immunotherapy.   He saw Dr. Sherrod on 09/28/23. He denies any major changes in his health since he was last seen except He experiences significant back pain on the left side, particularly around the pelvic bone, which has been worsening over the past few weeks. The pain is intense, rated between 8 to 10 out of 10, and sometimes radiates down his legs. It is exacerbated by prolonged standing and disrupts his sleep. He alternates between ice and heat for relief, which provides temporary comfort. He believes it is sciatica. He has seen a chiropractor in the past. He currently takes morphine  and oxytocin for pain management, but reports that the pain  persists despite these medications. He has not tried gabapentin  or similar medications for nerve pain.  No recent heavy lifting, urinary changes, or symptoms suggestive of a urinary tract infection. He experiences some pressure during urination, attributed to an enlarged prostate, but denies any pain, hematuria, or unusual odor.  Stable weight, no recent fevers, infections, or night sweats, and no recent episodes of hemoptysis. He uses an inhaler effectively for occasional shortness of breath and has a history of constipation, for which he takes sennakot. He acknowledges not drinking enough water, which may contribute to his constipation.  He was seen by the ENT simply for the hoarseness which was felt to be due to the left-sided lung cancer.  He received vocal cord injections. They did help but his voice is starting to become more hoarse again. He has a follow up next week.   He is currently taking an iron supplement and has been seen by a nutritionist in the past. No rashes.  He is here today for evaluation and repeat blood work before undergoing cycle number 19   MEDICAL HISTORY: Past Medical History:  Diagnosis Date   Aortic regurgitation    Aortic stenosis 04/04/2018   Atherosclerosis of native arteries of the extremities with intermittent claudication 09/08/2013   Atherosclerosis of native artery of extremity with intermittent claudication (HCC) 09/08/2013   IMO SNOMED Dx Update Oct 2024     Bilateral impacted cerumen 10/13/2018   Bilateral sensorineural hearing loss 12/14/2018   Cancer (HCC)    lung   Dyspnea    Encounter for antineoplastic chemotherapy  08/18/2022   Encounter for antineoplastic immunotherapy 08/18/2022   Former moderate cigarette smoker (10-19 per day) 05/20/2012   Quit smoking Nov 2013 when hospitalized w/ HTN and chest pain(diagnosed w/ valvular heart disease)   GERD (gastroesophageal reflux disease)    Heart murmur    History of radiation therapy    Lumbar  Spine, Thoracic Spine- 06/25/22-07/10/22- Dr. Lynwood Nasuti   History of radiation therapy    Left lung-07/29/23-08/11/23- Dr. 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  MEDICATIONS:  Current Outpatient Medications  Medication Sig Dispense Refill   albuterol  (VENTOLIN  HFA) 108 (90 Base) MCG/ACT inhaler Inhale 2 puffs into the lungs every 6 (six) hours as needed for wheezing or shortness of breath. 8 g 6   Ascorbic Acid (VITAMIN C PO) Take 1 tablet by mouth daily.     benzonatate  (TESSALON ) 100 MG capsule Take 1 capsule (100 mg total) by mouth 3 (three) times daily as needed. 30 capsule 2   Cholecalciferol (VITAMIN D3) 1000 UNITS CAPS Take 1,000 Units by mouth daily.     Coenzyme Q10 (CO Q 10 PO) Take 1 tablet by mouth daily.     Cyanocobalamin (VITAMIN B-12 PO) Take 1 tablet by mouth daily.     cyclobenzaprine (FLEXERIL) 5 MG tablet Take 1 tablet (5 mg total) by mouth 3 (three) times daily as needed for muscle spasms. 30 tablet 0   ELIQUIS  5 MG TABS tablet Take 1 tablet by mouth twice daily 60 tablet 0   ezetimibe  (ZETIA ) 10 MG tablet Take 1 tablet by mouth once daily 90 tablet 0   fluticasone  (FLONASE ) 50 MCG/ACT nasal spray Place 1-2 sprays into both nostrils daily. 16 g 6   KRILL OIL PO Take 1 tablet by mouth daily.     levofloxacin  (LEVAQUIN ) 500 MG tablet Take 1 tablet (500 mg total) by mouth daily. 7 tablet 0   lisinopril -hydrochlorothiazide  (ZESTORETIC ) 10-12.5 MG tablet Take 1 tablet by mouth once daily 90 tablet 0   MAGNESIUM PO Take 1 tablet by mouth daily.     methylPREDNISolone  (MEDROL  DOSEPAK) 4 MG TBPK tablet Take with signs of chronic  sinusitis and take as directed 1 each 1   metoCLOPramide  (REGLAN ) 10 MG tablet Take 1 tablet (10 mg total) by mouth 4 (four) times daily -  before meals and at bedtime. 56 tablet 0   metoprolol  tartrate (LOPRESSOR ) 25 MG tablet Take 1 tablet (25 mg total) by mouth once for 1 dose. Please take this medication 2 hours before CT 1 tablet 0   morphine  (MS CONTIN ) 15 MG 12 hr tablet Take 1 tablet (15 mg total) by mouth every 12 (twelve) hours. 60 tablet 0   Multiple Vitamins-Minerals (CENTRUM SILVER PO) Take 1 tablet by mouth daily.     Multiple Vitamins-Minerals (ZINC PO) Take 1 tablet by mouth daily.     Naphazoline-Pheniramine (OPCON-A) 0.027-0.315 % SOLN Place 1 drop into both eyes daily as needed (redness).     nitroGLYCERIN  (NITROSTAT ) 0.4 MG SL tablet Place 1 tablet (0.4 mg total) under the tongue every 5 (five) minutes as needed for chest pain. 25  tablet 1   omeprazole  (PRILOSEC) 20 MG capsule Take 1 capsule (20 mg total) by mouth daily. 30 capsule 0   omeprazole  (PRILOSEC) 20 MG capsule Take 1 capsule by mouth once daily 30 capsule 0   oxyCODONE  (OXY IR/ROXICODONE ) 5 MG immediate release tablet Take 1 tablet (5 mg total) by mouth every 4 (four) hours as needed for severe pain (pain score 7-10) or breakthrough pain. 90 tablet 0   potassium chloride  SA (KLOR-CON  M) 20 MEQ tablet Take 1 tablet (20 mEq total) by mouth daily. 6 tablet 0   prochlorperazine  (COMPAZINE ) 10 MG tablet Take 1 tablet (10 mg total) by mouth every 6 (six) hours as needed for nausea or vomiting. 30 tablet 0   rosuvastatin  (CRESTOR ) 5 MG tablet Take 1 tablet (5 mg total) by mouth at bedtime. 90 tablet 1   sucralfate  (CARAFATE ) 1 g tablet Take 1 tablet (1 g total) by mouth 4 (four) times daily -  with meals and at bedtime. Crush and dissolve in 10 mL's of warm water prior to swallowing, take 20 min prior to meals 60 tablet 1   triamcinolone  0.1%-Eucerin equivalent 1:1 cream mixture Apply topically 3 (three) times daily as needed.  480 g 2   TURMERIC PO Take 1 tablet by mouth 3 (three) times a week.     umeclidinium-vilanterol (ANORO ELLIPTA ) 62.5-25 MCG/ACT AEPB Inhale 1 puff into the lungs daily. 180 each 3   No current facility-administered medications for this visit.    SURGICAL HISTORY:  Past Surgical History:  Procedure Laterality Date   BRONCHIAL BIOPSY  05/26/2022   Procedure: BRONCHIAL BIOPSIES;  Surgeon: Brenna Adine CROME, DO;  Location: [facility];  Service: Pulmonary;;   BRONCHIAL BRUSHINGS  05/26/2022   Procedure: BRONCHIAL BRUSHINGS;  Surgeon: Brenna Adine CROME, DO;  Location: [facility];  Service: Pulmonary;;   NO PAST SURGERIES     VIDEO BRONCHOSCOPY  05/26/2022   Procedure: VIDEO BRONCHOSCOPY WITHOUT FLUORO;  Surgeon: Brenna Adine CROME, DO;  Location: [facility];  Service: Pulmonary;;    REVIEW OF SYSTEMS:   Review of Systems  Constitutional:  Negative for chills, fever, chills, unexplained weight loss, and fever.  HENT: Positive for hoarseness. Negative for mouth sores, nosebleeds, sore throat.   Eyes: Negative for eye problems and icterus.  Respiratory: Positive for dyspnea on exertion. Negative for wheezing.   Cardiovascular: Negative for chest pain and leg swelling.  Gastrointestinal: Positive for intermittent constipation. Negative for abdominal pain, diarrhea, nausea and vomiting.  Genitourinary: Negative for bladder incontinence, difficulty urinating, dysuria, frequency and hematuria.   Musculoskeletal: Positive for back pain radiating down leg.. Negative for gait problem, neck pain and neck stiffness.  Skin: Negative for itching and rash.  Neurological: Negative for dizziness, extremity weakness, gait problem, headaches, light-headedness and seizures.  Hematological: Negative for adenopathy. Does not bruise/bleed easily.  Psychiatric/Behavioral: Negative for confusion, depression and sleep disturbance. The patient is not nervous/anxious.    PHYSICAL EXAMINATION:  Blood pressure  111/66, pulse (!) 110, temperature 99.6 F (37.6 C), temperature source Temporal, resp. rate 17, weight 124 lb (56.2 kg), SpO2 96%.  ECOG PERFORMANCE STATUS: 1  Physical Exam  Constitutional: Oriented to person, place, and time and thin appearing male, and in no distress.  HENT:  Head: Normocephalic and atraumatic.  Mouth/Throat: Oropharynx is clear and moist. No oropharyngeal exudate.  Eyes: Conjunctivae are normal. Right eye exhibits no discharge. Left eye exhibits no discharge. No scleral icterus.  Neck: Normal range of motion. Neck supple.  Cardiovascular: Tachycardic, regular rhythm, normal heart sounds and intact distal pulses.   Pulmonary/Chest: Effort normal. Quiet breath sounds bilaterally. No respiratory distress. No wheezes. No rales.  Abdominal: Soft. Bowel sounds are normal. Exhibits no distension and no mass. There is no tenderness.  Musculoskeletal: Some tenderness to palpation over the left hip/low back. Normal range of motion. Exhibits no edema.  Lymphadenopathy:    No cervical adenopathy.  Neurological: Alert and oriented to person, place, and time. Exhibits muscle wasting. Gait normal. Coordination normal.  Skin: Skin is warm and dry. No rash noted. Not diaphoretic. No erythema. No pallor.  Psychiatric: Mood, memory and judgment normal.  Vitals reviewed.  LABORATORY DATA: Lab Results  Component Value Date   WBC 7.4 10/19/2023   HGB 10.7 (L) 10/19/2023   HCT 31.7 (L) 10/19/2023   MCV 87.6 10/19/2023   PLT 221 10/19/2023      Chemistry      Component Value Date/Time   NA 134 (L) 10/19/2023 0941   NA 146 (H) 01/05/2023 1114   K 4.1 10/19/2023 0941   CL 100 10/19/2023 0941   CO2 25 10/19/2023 0941   BUN 22 10/19/2023 0941   BUN 11 01/05/2023 1114   CREATININE 0.98 10/19/2023 0941   CREATININE 1.07 08/27/2014 1511      Component Value Date/Time   CALCIUM  9.0 10/19/2023 0941   ALKPHOS 66 10/19/2023 0941   AST 16 10/19/2023 0941   ALT 19 10/19/2023 0941    BILITOT 0.3 10/19/2023 0941       RADIOGRAPHIC STUDIES:  No results found.   ASSESSMENT/PLAN:  This is a very pleasant 68 year old African-American male with stage IV (T2a, N2, M1 B non-small cell lung cancer, squamous cell carcinoma.  He presented with a left upper lobe lung mass in addition to AP window lymphadenopathy and suspicious for metastases to T8 and L4 vertebrae as well as right retroperitoneal metastatic nodule.  He was diagnosed in April 2024.   His molecular studies by [email or web address] showed no actionable mutation.  His PD-L1 expression is 70%.   He underwent palliative radiation to the metastatic bone lesions.   He also receives Xgeva  every 6 weeks.  He is due for this at his next infusion   He is underwent palliative systemic chemotherapy and immunotherapy with carboplatin  for AUC of 5, paclitaxel  175 mg/m, and immunotherapy with Libtayo  350 mg IV every 3 weeks with Neulasta  support.  He is status post 4 cycles.     He started maintenance immunotherapy with Libtayo . He is status post 18 cycles of maintenance.    His restaging CT scan from June 2025 showed increased size of the left hilar mass which causes central obstruction of a left upper lobe bronchus, increase in the left hilar and mediastinal lymph nodes, and increased in numerous ground glass pulmonary nodules which are nonspecific.  Dr. Sherrod recommended referral to radiation to discuss palliative radiation to the obstructive lung mass.    He underwent radiation to the left hilar mass which was completed on 08/11/23.    Labs were reviewed. Recommend he proceed with cycle #19 today as scheduled.    We will see him back for follow-up in 3 weeks for undergoing the next cycle of treatment.   He will continue taking his iron supplement for anemia.    Will continue taking his blood thinner for now due to his history of PE.SABRA   He will see palliative care while in the infusion room.   Patient is concerned about  possible pinched nerve or compression fracture in his back as he is having worsening back pain.  The patient does have a history of bone mets that L4.  We will arrange for an x-ray today to ensure no compression fracture or other concern.  If nothing clear we can consider MRI of the low back.  The patient is tachycardic today which is unusual for him.  He denies any history of arrhythmia.  We will arrange for EKG to be performed today.  We discussed considering gabapentin  for his nerve pain he will also talk to palliative care and see what they think.  It would be ideal if we could avoid steroids since he is on immunotherapy.  However we can revisit it if his pain is not adequately controlled.  He will also continue to use ice and heating pads.  He will follow-up with ENT next week for the hoarseness.   The patient was advised to call immediately if she has any concerning symptoms in the interval. The patient voices understanding of current disease status and treatment options and is in agreement with the current care plan. All questions were answered. The patient knows to call the clinic with any problems, questions or concerns. We can certainly see the patient much sooner if necessary   Orders Placed This Encounter  Procedures   DG Lumbar Spine 2-3 Views    Standing Status:   Future    Expected Date:   10/19/2023    Expiration Date:   10/18/2024    Reason for Exam (SYMPTOM  OR DIAGNOSIS REQUIRED):   Lung cancer with bone mets L4 wit worsening back pain. Ensure no compression fracture or anything clear    Preferred imaging location?:   [facility]     The total time spent in the appointment was 30-39 minutes  Linville Decarolis L Jupiter Kabir, PA-C 10/19/23

## 2023-10-14 NOTE — Telephone Encounter (Signed)
 Spoke with patient's wife, Verneita, regarding a letter for dismissal of jury duty scheduled for 11/08/23. Juror number is [id number].   Informed Verneita that our office will provide a letter of dismissal. Asked how she would prefer to receive the letter, and she stated that it could be mailed to their home.

## 2023-10-15 ENCOUNTER — Encounter: Payer: Self-pay | Admitting: Internal Medicine

## 2023-10-16 ENCOUNTER — Other Ambulatory Visit: Payer: Self-pay | Admitting: Nurse Practitioner

## 2023-10-16 DIAGNOSIS — G893 Neoplasm related pain (acute) (chronic): Secondary | ICD-10-CM

## 2023-10-16 DIAGNOSIS — Z515 Encounter for palliative care: Secondary | ICD-10-CM

## 2023-10-16 DIAGNOSIS — C3492 Malignant neoplasm of unspecified part of left bronchus or lung: Secondary | ICD-10-CM

## 2023-10-18 ENCOUNTER — Encounter: Payer: Self-pay | Admitting: Internal Medicine

## 2023-10-18 MED ORDER — OXYCODONE HCL 5 MG PO TABS: 5.0000 mg | ORAL_TABLET | ORAL | 0 refills | Status: DC | PRN

## 2023-10-19 ENCOUNTER — Other Ambulatory Visit: Payer: Self-pay

## 2023-10-19 ENCOUNTER — Ambulatory Visit (HOSPITAL_COMMUNITY)
Admission: RE | Admit: 2023-10-19 | Discharge: 2023-10-19 | Disposition: A | Discharge status: Home / Self Care | Source: Ambulatory Visit | Attending: Physician Assistant

## 2023-10-19 ENCOUNTER — Inpatient Hospital Stay (HOSPITAL_BASED_OUTPATIENT_CLINIC_OR_DEPARTMENT_OTHER): Admitting: Physician Assistant

## 2023-10-19 ENCOUNTER — Inpatient Hospital Stay (HOSPITAL_BASED_OUTPATIENT_CLINIC_OR_DEPARTMENT_OTHER): Admitting: Nurse Practitioner

## 2023-10-19 ENCOUNTER — Encounter: Payer: Self-pay | Admitting: Nurse Practitioner

## 2023-10-19 ENCOUNTER — Inpatient Hospital Stay: Attending: Internal Medicine

## 2023-10-19 ENCOUNTER — Inpatient Hospital Stay

## 2023-10-19 ENCOUNTER — Ambulatory Visit: Payer: Self-pay

## 2023-10-19 VITALS — BP 111/66 | HR 110 | Temp 99.6°F | Resp 17 | Wt 124.0 lb

## 2023-10-19 DIAGNOSIS — G893 Neoplasm related pain (acute) (chronic): Secondary | ICD-10-CM | POA: Insufficient documentation

## 2023-10-19 DIAGNOSIS — R630 Anorexia: Secondary | ICD-10-CM | POA: Diagnosis not present

## 2023-10-19 DIAGNOSIS — Z87891 Personal history of nicotine dependence: Secondary | ICD-10-CM | POA: Diagnosis not present

## 2023-10-19 DIAGNOSIS — Z79620 Long term (current) use of immunosuppressive biologic: Secondary | ICD-10-CM | POA: Diagnosis not present

## 2023-10-19 DIAGNOSIS — R530 Neoplastic (malignant) related fatigue: Secondary | ICD-10-CM

## 2023-10-19 DIAGNOSIS — C3492 Malignant neoplasm of unspecified part of left bronchus or lung: Secondary | ICD-10-CM

## 2023-10-19 DIAGNOSIS — C7951 Secondary malignant neoplasm of bone: Secondary | ICD-10-CM | POA: Insufficient documentation

## 2023-10-19 DIAGNOSIS — Z5112 Encounter for antineoplastic immunotherapy: Secondary | ICD-10-CM | POA: Diagnosis not present

## 2023-10-19 DIAGNOSIS — C3412 Malignant neoplasm of upper lobe, left bronchus or lung: Secondary | ICD-10-CM | POA: Diagnosis present

## 2023-10-19 DIAGNOSIS — Z515 Encounter for palliative care: Secondary | ICD-10-CM | POA: Diagnosis not present

## 2023-10-19 DIAGNOSIS — M792 Neuralgia and neuritis, unspecified: Secondary | ICD-10-CM

## 2023-10-19 LAB — CBC WITH DIFFERENTIAL (CANCER CENTER ONLY)
Abs Immature Granulocytes: 0.02 K/uL (ref 0.00–0.07)
Basophils Absolute: 0 K/uL (ref 0.0–0.1)
Basophils Relative: 0 %
Eosinophils Absolute: 0 K/uL (ref 0.0–0.5)
Eosinophils Relative: 0 %
HCT: 31.7 % — ABNORMAL LOW (ref 39.0–52.0)
Hemoglobin: 10.7 g/dL — ABNORMAL LOW (ref 13.0–17.0)
Immature Granulocytes: 0 %
Lymphocytes Relative: 7 %
Lymphs Abs: 0.5 K/uL — ABNORMAL LOW (ref 0.7–4.0)
MCH: 29.6 pg (ref 26.0–34.0)
MCHC: 33.8 g/dL (ref 30.0–36.0)
MCV: 87.6 fL (ref 80.0–100.0)
Monocytes Absolute: 0.7 K/uL (ref 0.1–1.0)
Monocytes Relative: 10 %
Neutro Abs: 6.1 K/uL (ref 1.7–7.7)
Neutrophils Relative %: 83 %
Platelet Count: 221 K/uL (ref 150–400)
RBC: 3.62 MIL/uL — ABNORMAL LOW (ref 4.22–5.81)
RDW: 13.3 % (ref 11.5–15.5)
WBC Count: 7.4 K/uL (ref 4.0–10.5)
nRBC: 0 % (ref 0.0–0.2)

## 2023-10-19 LAB — CMP (CANCER CENTER ONLY)
ALT: 19 U/L (ref 0–44)
AST: 16 U/L (ref 15–41)
Albumin: 3.7 g/dL (ref 3.5–5.0)
Alkaline Phosphatase: 66 U/L (ref 38–126)
Anion gap: 9 (ref 5–15)
BUN: 22 mg/dL (ref 8–23)
CO2: 25 mmol/L (ref 22–32)
Calcium: 9 mg/dL (ref 8.9–10.3)
Chloride: 100 mmol/L (ref 98–111)
Creatinine: 0.98 mg/dL (ref 0.61–1.24)
GFR, Estimated: >60 mL/min (ref >60)
Glucose, Bld: 148 mg/dL — ABNORMAL HIGH (ref 70–99)
Potassium: 4.1 mmol/L (ref 3.5–5.1)
Sodium: 134 mmol/L — ABNORMAL LOW (ref 135–145)
Total Bilirubin: 0.3 mg/dL (ref 0.0–1.2)
Total Protein: 7.2 g/dL (ref 6.5–8.1)

## 2023-10-19 LAB — TSH: TSH: 0.465 u[IU]/mL (ref 0.350–4.500)

## 2023-10-19 MED ORDER — GABAPENTIN 300 MG PO CAPS: 300.0000 mg | ORAL_CAPSULE | Freq: Two times a day (BID) | ORAL | 0 refills | Status: DC

## 2023-10-19 MED ORDER — SODIUM CHLORIDE 0.9 % IV SOLN: Freq: Once | INTRAVENOUS | Status: AC

## 2023-10-19 MED ORDER — SODIUM CHLORIDE 0.9 % IV SOLN
350.0000 mg | Freq: Once | INTRAVENOUS | Status: AC
  Administered 2023-10-19: 350 mg via INTRAVENOUS
  Filled 2023-10-19: qty 7

## 2023-10-19 NOTE — Patient Instructions (Signed)
 [facility] - A DEPT OF [facility]   Discharge Instructions: Thank you for choosing [facility] to provide your oncology and hematology care.   If you have a lab appointment with the Cancer Center, please go directly to the Cancer Center and check in at the registration area.   Wear comfortable clothing and clothing appropriate for easy access to any Portacath or PICC line.   We strive to give you quality time with your provider. You may need to reschedule your appointment if you arrive late (15 or more minutes).  Arriving late affects you and other patients whose appointments are after yours.  Also, if you miss three or more appointments without notifying the office, you may be dismissed from the clinic at the provider's discretion.      For prescription refill requests, have your pharmacy contact our office and allow 72 hours for refills to be completed.    Today you received the following chemotherapy and/or immunotherapy agents: Cemiplimab (Libtayo)       To help prevent nausea and vomiting after your treatment, we encourage you to take your nausea medication as directed.  BELOW ARE SYMPTOMS THAT SHOULD BE REPORTED IMMEDIATELY: *FEVER GREATER THAN 100.4 F (38 C) OR HIGHER *CHILLS OR SWEATING *NAUSEA AND VOMITING THAT IS NOT CONTROLLED WITH YOUR NAUSEA MEDICATION *UNUSUAL SHORTNESS OF BREATH *UNUSUAL BRUISING OR BLEEDING *URINARY PROBLEMS (pain or burning when urinating, or frequent urination) *BOWEL PROBLEMS (unusual diarrhea, constipation, pain near the anus) TENDERNESS IN MOUTH AND THROAT WITH OR WITHOUT PRESENCE OF ULCERS (sore throat, sores in mouth, or a toothache) UNUSUAL RASH, SWELLING OR PAIN  UNUSUAL VAGINAL DISCHARGE OR ITCHING   Items with * indicate a potential emergency and should be followed up as soon as possible or go to the Emergency Department if any problems should occur.  Please show the CHEMOTHERAPY ALERT CARD or  IMMUNOTHERAPY ALERT CARD at check-in to the Emergency Department and triage nurse.  Should you have questions after your visit or need to cancel or reschedule your appointment, please contact [facility] - A DEPT OF [facility]  Dept: [phone]  and follow the prompts.  Office hours are 8:00 a.m. to 4:30 p.m. Monday - Friday. Please note that voicemails left after 4:00 p.m. may not be returned until the following business day.  We are closed weekends and major holidays. You have access to a nurse at all times for urgent questions. Please call the main number to the clinic Dept: [phone] and follow the prompts.   For any non-urgent questions, you may also contact your provider using MyChart. We now offer e-Visits for anyone 70 and older to request care online for non-urgent symptoms. For details visit [email or web address].   Also download the MyChart app! Go to the app store, search "MyChart", open the app, select [place], and log in with your MyChart username and password.

## 2023-10-19 NOTE — Telephone Encounter (Signed)
 Spoke with patient regarding X-ray results. Per Cassie, PA, X-ray appears stable with no fractures noted, and the bone spot remains stable. Multilevel arthritis is present. Patient voiced understanding.

## 2023-10-19 NOTE — Progress Notes (Signed)
 Palliative Medicine [facility]  Telephone:([phone]) [phone] Fax:([phone]) [phone]   Name: Albert Hardy. Date: 10/19/2023 MRN: [id number]  DOB: 1955-05-28  Patient Care Team: [name], MD as PCP - General (Family Medicine)    INTERVAL HISTORY: Albert Hardy. is a 68 y.o. 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 Palliative ask to see for symptom management and goals of care   SOCIAL HISTORY:     reports that he quit smoking about 11 years ago. His smoking use included cigarettes. He started smoking about 41 years ago. He has a 30 pack-year smoking history. He has never used smokeless tobacco. He reports current alcohol use. He reports current drug use. Frequency: 1.00 time per week. Drug: Marijuana.  ADVANCE DIRECTIVES:  None on file  CODE STATUS: Full code  PAST MEDICAL HISTORY: Past Medical History:  Diagnosis Date   Aortic regurgitation    Aortic stenosis 04/04/2018   Atherosclerosis of native arteries of the extremities with intermittent claudication 09/08/2013   Atherosclerosis of native artery of extremity with intermittent claudication (HCC) 09/08/2013   IMO SNOMED Dx Update Oct 2024     Bilateral impacted cerumen 10/13/2018   Bilateral sensorineural hearing loss 12/14/2018   Cancer (HCC)    lung   Dyspnea    Encounter for antineoplastic chemotherapy 08/18/2022   Encounter for antineoplastic immunotherapy 08/18/2022   Former moderate cigarette smoker (10-19 per day) 05/20/2012   Quit smoking Nov 2013 when hospitalized w/ HTN and chest pain(diagnosed w/ valvular heart disease)   GERD (gastroesophageal reflux disease)    Heart murmur    History of radiation therapy    Lumbar Spine, Thoracic Spine- 06/25/22-07/10/22- Dr. Lynwood Nasuti   History of radiation therapy    Left lung-07/29/23-08/11/23- Dr. 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  MEDICATIONS:  Current Outpatient Medications  Medication Sig Dispense Refill   gabapentin  (NEURONTIN ) 300 MG capsule Take 1 capsule (300 mg total) by mouth 2 (two) times daily. 60 capsule 0   albuterol  (VENTOLIN  HFA) 108 (90 Base) MCG/ACT inhaler Inhale 2 puffs into the lungs every 6 (six) hours as needed for wheezing or shortness of breath. 8 g 6   Ascorbic Acid (VITAMIN C PO) Take 1 tablet by mouth daily.     benzonatate  (TESSALON ) 100 MG capsule Take 1 capsule (100 mg total) by mouth 3 (three) times daily as needed. 30 capsule 2   Cholecalciferol (VITAMIN D3) 1000 UNITS CAPS Take 1,000 Units by mouth daily.     Coenzyme Q10 (CO Q 10 PO) Take 1 tablet by mouth daily.     Cyanocobalamin (VITAMIN B-12 PO) Take 1 tablet by mouth daily.     cyclobenzaprine (FLEXERIL) 5 MG tablet Take 1 tablet (5 mg total) by mouth 3 (three) times daily as needed for muscle spasms. 30 tablet 0   ELIQUIS  5 MG TABS tablet Take 1 tablet by mouth twice daily 60 tablet 0   ezetimibe  (ZETIA ) 10 MG tablet Take 1 tablet by mouth once daily 90 tablet 0   fluticasone  (  FLONASE ) 50 MCG/ACT nasal spray Place 1-2 sprays into both nostrils daily. 16 g 6   KRILL OIL PO Take 1 tablet by mouth daily.     levofloxacin  (LEVAQUIN ) 500 MG tablet Take 1 tablet (500 mg total) by mouth daily. 7 tablet 0   lisinopril -hydrochlorothiazide  (ZESTORETIC ) 10-12.5 MG tablet Take 1 tablet by mouth once daily 90 tablet 0   MAGNESIUM PO Take 1 tablet by mouth daily.     methylPREDNISolone  (MEDROL  DOSEPAK) 4 MG TBPK tablet Take with signs of chronic sinusitis and take as directed 1 each 1   metoCLOPramide  (REGLAN ) 10 MG tablet Take 1  tablet (10 mg total) by mouth 4 (four) times daily -  before meals and at bedtime. 56 tablet 0   metoprolol  tartrate (LOPRESSOR ) 25 MG tablet Take 1 tablet (25 mg total) by mouth once for 1 dose. Please take this medication 2 hours before CT 1 tablet 0   morphine  (MS CONTIN ) 15 MG 12 hr tablet Take 1 tablet (15 mg total) by mouth every 12 (twelve) hours. 60 tablet 0   Multiple Vitamins-Minerals (CENTRUM SILVER PO) Take 1 tablet by mouth daily.     Multiple Vitamins-Minerals (ZINC PO) Take 1 tablet by mouth daily.     Naphazoline-Pheniramine (OPCON-A) 0.027-0.315 % SOLN Place 1 drop into both eyes daily as needed (redness).     nitroGLYCERIN  (NITROSTAT ) 0.4 MG SL tablet Place 1 tablet (0.4 mg total) under the tongue every 5 (five) minutes as needed for chest pain. 25 tablet 1   omeprazole  (PRILOSEC) 20 MG capsule Take 1 capsule (20 mg total) by mouth daily. 30 capsule 0   omeprazole  (PRILOSEC) 20 MG capsule Take 1 capsule by mouth once daily 30 capsule 0   oxyCODONE  (OXY IR/ROXICODONE ) 5 MG immediate release tablet Take 1 tablet (5 mg total) by mouth every 4 (four) hours as needed for severe pain (pain score 7-10) or breakthrough pain. 90 tablet 0   potassium chloride  SA (KLOR-CON  M) 20 MEQ tablet Take 1 tablet (20 mEq total) by mouth daily. 6 tablet 0   prochlorperazine  (COMPAZINE ) 10 MG tablet Take 1 tablet (10 mg total) by mouth every 6 (six) hours as needed for nausea or vomiting. 30 tablet 0   rosuvastatin  (CRESTOR ) 5 MG tablet Take 1 tablet (5 mg total) by mouth at bedtime. 90 tablet 1   sucralfate  (CARAFATE ) 1 g tablet Take 1 tablet (1 g total) by mouth 4 (four) times daily -  with meals and at bedtime. Crush and dissolve in 10 mL's of warm water prior to swallowing, take 20 min prior to meals 60 tablet 1   triamcinolone  0.1%-Eucerin equivalent 1:1 cream mixture Apply topically 3 (three) times daily as needed. 480 g 2   TURMERIC PO Take 1 tablet by mouth 3 (three) times a week.      umeclidinium-vilanterol (ANORO ELLIPTA ) 62.5-25 MCG/ACT AEPB Inhale 1 puff into the lungs daily. 180 each 3   No current facility-administered medications for this visit.   Facility-Administered Medications Ordered in Other Visits  Medication Dose Route Frequency Provider Last Rate Last Admin   cemiplimab -rwlc (LIBTAYO ) 350 mg in sodium chloride  0.9 % 100 mL chemo infusion  350 mg Intravenous Once Sherrod Sherrod, MD        VITAL SIGNS: There were no vitals taken for this visit. There were no vitals filed for this visit.  Estimated body mass index is 18.31 kg/m as calculated from the following:   Height as of 09/28/23: 5' 9 (  1.753 m).   Weight as of an earlier encounter on 10/19/23: 124 lb (56.2 kg).     Latest Ref Rng & Units 10/19/2023    9:41 AM 09/28/2023    8:32 AM 09/07/2023    8:40 AM  CBC  WBC 4.0 - 10.5 K/uL 7.4  3.9  4.7   Hemoglobin 13.0 - 17.0 g/dL 89.2  88.9  87.9   Hematocrit 39.0 - 52.0 % 31.7  32.9  35.3   Platelets 150 - 400 K/uL 221  229  220        Latest Ref Rng & Units 10/19/2023    9:41 AM 09/28/2023    8:32 AM 09/07/2023    8:40 AM  CMP  Glucose 70 - 99 mg/dL 851  850  883   BUN 8 - 23 mg/dL 22  20  30    Creatinine 0.61 - 1.24 mg/dL 9.01  9.00  8.93   Sodium 135 - 145 mmol/L 134  137  136   Potassium 3.5 - 5.1 mmol/L 4.1  3.8  3.9   Chloride 98 - 111 mmol/L 100  101  100   CO2 22 - 32 mmol/L 25  26  27    Calcium  8.9 - 10.3 mg/dL 9.0  9.1  9.2   Total Protein 6.5 - 8.1 g/dL 7.2  7.4  7.9   Total Bilirubin 0.0 - 1.2 mg/dL 0.3  0.3  0.3   Alkaline Phos 38 - 126 U/L 66  63  65   AST 15 - 41 U/L 16  18  20    ALT 0 - 44 U/L 19  21  27      PERFORMANCE STATUS (ECOG) : 1 - Symptomatic but completely ambulatory   Physical Exam General: NAD Cardiovascular: RRR Pulmonary: normal breathing pattern  Extremities: no edema, no joint deformities Skin: no rashes Neurological: AAO x4  IMPRESSION: Discussed the use of AI scribe software for clinical note  transcription with the patient, who gave verbal consent to proceed.  History of Present Illness Abdoulie Tierce. is a 68 year old male who was seen during infusion for symptom management follow-up. Denies nausea, vomiting, constipation, or diarrhea. Appetite fluctuates. Some days are better than others. Weight stable at 124lbs. He continues to take things one day at a time.   He has been experiencing pain on the lower left side of his back, specifically at the bone of his pelvis, for the last couple of weeks. The pain is significant enough to interrupt his sleep and cause discomfort while sitting, requiring constant adjustment to avoid exacerbating the pain.  He has been using heat therapy, alternating with ice, to manage the pain, but notes that while it provides temporary relief, the effect does not last. Describes pain as stabbing, pinching, burning. Education provided on the use of Gabapentin  including administration, efficacy, and potential side effects. Patient verbalized understanding.   Mr. Weare reports his pain overall has been well controlled on current regimen. He is currently taking MS Contin  15mg  every 12 hours. Does not require breakthrough medication daily. No adjustments to regimen at this time.   We will continue to closely follow and support. All questions answered and support provided.  Goals of Care  06/18/22-  We discussed his current illness and what it means in the larger context of hsi on-going co-morbidities. Natural disease trajectory and expectations were discussed.  Mr. Bartunek and his wife are realistic in their expectations and understanding of his incurable cancer. He knows all  treatment is palliative focused. Wishes to continue taking things one day at a time focusing on his quality of life allowing him every opportunity to continue to thrive.   We discussed Her current illness and what it means in the larger context of Her on-going co-morbidities. Natural disease  trajectory and expectations were discussed.  I discussed the importance of continued conversation with family and their medical providers regarding overall plan of care and treatment options, ensuring decisions are within the context of the patients values and GOCs. Assessment & Plan Appetite fluctuations and weight loss Intermittent appetite fluctuations with associated weight loss.  Weight is stable at 124 pounds.  Appetite is influenced by stomach discomfort and food aversions. Encouraged by weight gain but experiences frustration with fluctuations. - Encourage consumption of small, frequent meals - Advise trying softer foods like eggs on days with poor appetite - Reinforce the importance of maintaining nutritional intake  Constipation Constipation remains an ongoing issue, described as an 'adventure' and 'rough at times'. The condition is more steady but still challenging. - Continue daily stool softener as needed for constipation.  Chronic cancer related pain management Pain management is well-controlled with current medications. - Continue MS Contin  15 mg every 12 hours. - Patient is not requiring breakthrough medication around the clock at this time. - Continue oxycodone  5 mg every 4 hours as needed.  Lumbar radiculopathy with left lower back and pelvic nerve pain Chronic nerve pain in the lower left back and pelvis, persisting for weeks, severe enough to disrupt sleep and sitting. Current heat management is inadequate. - Prescribe gabapentin  300 mg, one capsule at bedtime for the first three days. - If tolerated without excessive drowsiness, increase to one capsule in the morning and one at bedtime. - Monitor for side effects such as drowsiness, grogginess, or memory changes. - Instruct to contact the provider if side effects are intolerable or if there are any concerns. - Send prescription to [facility] pharmacy.   I will plan to follow-up with patient in 4-6 weeks. Sooner if  needed.  Patient expressed understanding and was in agreement with this plan. He also understands that He can call the clinic at any time with any questions, concerns, or complaints.   Any controlled substances utilized were prescribed in the context of palliative care. PDMP has been reviewed.   Visit consisted of counseling and education dealing with the complex and emotionally intense issues of symptom management and palliative care in the setting of serious and potentially life-threatening illness.  Levon Borer, AGPCNP-BC  Palliative Medicine Team/[facility]

## 2023-10-20 ENCOUNTER — Other Ambulatory Visit: Payer: Self-pay | Admitting: Internal Medicine

## 2023-10-20 ENCOUNTER — Telehealth: Payer: Self-pay

## 2023-10-20 DIAGNOSIS — I2699 Other pulmonary embolism without acute cor pulmonale: Secondary | ICD-10-CM

## 2023-10-20 LAB — T4: T4, Total: 8.3 ug/dL (ref 4.5–12.0)

## 2023-10-20 NOTE — Telephone Encounter (Signed)
 Faxed over EKG results to Dr. Madireddy's office with confirmation @ [phone].

## 2023-10-28 ENCOUNTER — Encounter (INDEPENDENT_AMBULATORY_CARE_PROVIDER_SITE_OTHER): Payer: Self-pay | Admitting: Otolaryngology

## 2023-10-28 ENCOUNTER — Ambulatory Visit (INDEPENDENT_AMBULATORY_CARE_PROVIDER_SITE_OTHER): Admitting: Otolaryngology

## 2023-10-28 VITALS — BP 109/64 | HR 103 | Temp 98.6°F

## 2023-10-28 DIAGNOSIS — J383 Other diseases of vocal cords: Secondary | ICD-10-CM

## 2023-10-28 DIAGNOSIS — R490 Dysphonia: Secondary | ICD-10-CM | POA: Diagnosis not present

## 2023-10-28 DIAGNOSIS — Z87891 Personal history of nicotine dependence: Secondary | ICD-10-CM | POA: Diagnosis not present

## 2023-10-28 DIAGNOSIS — J3801 Paralysis of vocal cords and larynx, unilateral: Secondary | ICD-10-CM | POA: Diagnosis not present

## 2023-10-28 DIAGNOSIS — C3492 Malignant neoplasm of unspecified part of left bronchus or lung: Secondary | ICD-10-CM

## 2023-10-28 NOTE — Progress Notes (Signed)
 ENT Progress Note:   Update 10/28/2023  Discussed the use of AI scribe software for clinical note transcription with the patient, who gave verbal consent to proceed.  History of Present Illness  Discussed the use of AI scribe software for clinical note transcription with the patient, who gave verbal consent to proceed.  History of Present Illness Albert Lia. is a 68 year old male who presents with voice changes following a vocal cord injection with Restylane on 10/05/23.  He experiences intermittent mild hoarseness following a vocal cord injection performed approximately two and a half weeks ago. The voice changes are described as 'it comes and goes a little bit.'  There is definite improvement in his voice since the procedure. His voice is stronger when using mid to higher frequencies, sounding clearer at a slightly higher pitch.  Records Reviewed:  Initial Evaluation  Reason for Consult: hx of lung cancer and left VF paralysis    HPI: Discussed the use of AI scribe software for clinical note transcription with the patient, who gave verbal consent to proceed.  History of Present Illness Albert Rayner. is a 68 year old male with lung cancer who presents with loss of voice.  He lost his voice approximately two weeks ago. He is undergoing maintenance therapy for lung cancer, and recent scans have raised concerns about vocal cord paralysis. He strains to talk and notes that his voice has not returned since the onset of symptoms.  He experiences occasional difficulty with swallowing, describing it as feeling like 'stuff gets stuck a little bit.' He also reports some labored breathing recently, although it is not as severe as it was prior to undergoing radiation therapy, which had previously improved his breathing.  His past medical history is significant for lung cancer, and he has a history of smoking, having quit in 2013.  Records Reviewed:  Palliative Medicine note  08/17/23  INTERVAL HISTORY: Albert Szymborski. is a 68 y.o. 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 Palliative ask to see for symptom management and goals of care    Oncology office visit Cassandra Heilingoetter PA DIAGNOSIS: Stage IV (T2a, N2, M1 B) non-small cell lung cancer, squamous cell carcinoma presented with left upper lobe lung mass in addition to AP window lymphadenopathy and suspicious bone metastasis to the T8 and L4 vertebrae in addition to retroperitoneal metastatic soft tissue nodule diagnosed in April 2024.    Molecular studies by [id number] showed no actionable mutations and PD-L1 expression of 70%.   PRIOR THERAPY: 1) Palliative radiotherapy to the T8 and L4 metastatic bone disease. 2) Systemic chemotherapy with carboplatin  for AUC of 5, paclitaxel  175 Mg/M2 and Libtayo  (Cempilimab) 350 Mg IV every 3 weeks with Neulasta  support for 4 cycles  3) Palliative radiation to the left hilar mass under the care of Dr. Shannon. Last dose on 08/11/23.    CURRENT THERAPY:  Maintenance treatment with single agent Libtayo  (Cempilimab) 350 Mg IV every 3 weeks.  First dose September 08, 2022.  Status post 15 cycles.  2) Xgeva  every 6 weeks   Past Medical History:  Diagnosis Date   Aortic regurgitation    Aortic stenosis 04/04/2018   Atherosclerosis of native arteries of the extremities with intermittent claudication 09/08/2013   Atherosclerosis of native artery of extremity with intermittent claudication (HCC) 09/08/2013   IMO SNOMED Dx Update Oct 2024     Bilateral impacted cerumen 10/13/2018   Bilateral sensorineural hearing loss 12/14/2018  Cancer [facility])    lung   Dyspnea    Encounter for antineoplastic chemotherapy 08/18/2022   Encounter for antineoplastic immunotherapy 08/18/2022   Former moderate cigarette smoker (10-19 per day) 05/20/2012   Quit smoking Nov 2013 when hospitalized w/ HTN and chest pain(diagnosed w/  valvular heart disease)   GERD (gastroesophageal reflux disease)    Heart murmur    History of radiation therapy    Lumbar Spine, Thoracic Spine- 06/25/22-07/10/22- Dr. Lynwood Nasuti   History of radiation therapy    Left lung-07/29/23-08/11/23- Dr. 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   Substance abuse (HCC)     Past Surgical History:  Procedure Laterality Date   BRONCHIAL BIOPSY  05/26/2022   Procedure: BRONCHIAL BIOPSIES;  Surgeon: Brenna Adine CROME, DO;  Location: [facility];  Service: Pulmonary;;   BRONCHIAL BRUSHINGS  05/26/2022   Procedure: BRONCHIAL BRUSHINGS;  Surgeon: Brenna Adine CROME, DO;  Location: [facility];  Service: Pulmonary;;   NO PAST SURGERIES     VIDEO BRONCHOSCOPY  05/26/2022   Procedure: VIDEO BRONCHOSCOPY WITHOUT FLUORO;  Surgeon: Brenna Adine CROME, DO;  Location: [facility];  Service: Pulmonary;;    Family History  Problem Relation Age of Onset   Other Brother    Hypertension Brother    Hyperlipidemia Brother    Arthritis Mother    Hypertension Mother    Hyperlipidemia Mother    Colon cancer Mother        43's   Hypertension Sister    Hyperlipidemia Sister    Diabetes Sister    Heart attack Father    Hyperlipidemia Brother     Social History:  reports that he quit smoking about 11 years ago. His smoking use included cigarettes. He started smoking about 41 years ago. He has a 30 pack-year smoking history. He has never used smokeless tobacco. He reports current alcohol use. He reports current drug use. Frequency: 1.00 time per week. Drug: Marijuana.  Allergies:  Allergies  Allergen Reactions   Lipitor [Atorvastatin]     myalgia   Pravastatin Other (See Comments)    myalgia    Medications: I  have reviewed the patient's current medications.  The PMH, PSH, Medications, Allergies, and SH were reviewed and updated.  ROS: Constitutional: Negative for fever, weight loss and weight gain. Cardiovascular: Negative for chest pain and dyspnea on exertion. Respiratory: Is not experiencing shortness of breath at rest. Gastrointestinal: Negative for nausea and vomiting. Neurological: Negative for headaches. Psychiatric: The patient is not nervous/anxious  Blood pressure 109/64, pulse (!) 103, temperature 98.6 F (37 C), SpO2 90%. There is no height or weight on file to calculate BMI.  PHYSICAL EXAM:  Exam: General: Well-developed, well-nourished Communication and Voice: improved projection clear with E slightly hoarse with U Respiratory Respiratory effort: Equal inspiration and expiration without stridor Cardiovascular Peripheral Vascular: Warm extremities with equal color/perfusion Eyes: No nystagmus with equal extraocular motion bilaterally Neuro/Psych/Balance: Patient oriented to person, place, and time; Appropriate mood and affect; Gait is intact with no imbalance; Cranial nerves I-XII are intact Head and Face Inspection: Normocephalic and atraumatic without mass or lesion Palpation: Facial skeleton intact without bony stepoffs Salivary Glands: No mass or tenderness Facial Strength: Facial motility symmetric and  full bilaterally ENT Pinna: External ear intact and fully developed External canal: Canal is patent with intact skin Tympanic Membrane: Clear and mobile External Nose: No scar or anatomic deformity Internal Nose: Septum is relatively straight slight deviation to the left. No polyp, or purulence. Mucosal edema and erythema present.  Bilateral inferior turbinate hypertrophy.  Lips, Teeth, and gums: Mucosa and teeth intact and viable TMJ: No pain to palpation with full mobility Oral cavity/oropharynx: No erythema or exudate, no lesions present Nasopharynx: No mass  or lesion with intact mucosa Hypopharynx: Intact mucosa without pooling of secretions Larynx Glottic: Full true vocal cord mobility on the right and paralysis on the left, improved glottic closure Supraglottic: Normal appearing epiglottis and AE folds Interarytenoid Space: Moderate pachydermia&edema Subglottic Space: Patent without lesion or edema Neck Neck and Trachea: Midline trachea without mass or lesion Thyroid : No mass or nodularity Lymphatics: No lymphadenopathy  Procedure:  Preoperative diagnosis: hoarseness VF paralysis on the left, s/p Restylane injection augmentation  Postoperative diagnosis:   same   Procedure: Flexible fiberoptic laryngoscopy with stroboscopy (68420)   Surgeon: Elena Larry, MD  Anesthesia: Topical lidocaine  and Afrin  Complications: None  Condition is stable throughout exam  Indications and consent:   The patient presents to the clinic with hoarseness. All the risks, benefits, and potential complications were reviewed with the patient preoperatively and informed verbal consent was obtained.  Procedure: The patient was seated upright in the exam chair.   Topical lidocaine  and Afrin were applied to the nasal cavity. After adequate anesthesia had occurred, the flexible telescope with strobe capabilities was passed into the nasal cavity. The nasopharynx was patent without mass or lesion. The scope was passed behind the soft palate and directed toward the base of tongue. The base of tongue was visualized and was symmetric with no apparent masses or abnormal appearing tissue. There were no signs of a mass or pooling of secretions in the piriform sinuses. The supraglottic structures were normal.  The true vocal cords are mobile on the right and immobile paralyzed on the left. The medial edges were straight Closure was near complete without supraglottic compression. Periodicity present. The mucosal wave and amplitude were intact. There is moderate  interarytenoid pachydermia and post cricoid edema. The mucosa appears without lesions.   The laryngoscope was then slowly withdrawn and the patient tolerated the procedure well. There were no complications or blood loss.    Studies Reviewed: CT neck 09/08/23 IMPRESSION: 1. Findings compatible with left vocal cord paralysis, which is detailed above and likely is due to the left hilar mass and ap window adenopathy. No mass in the neck. 2. Please see same day CT chest for intrathoracic findings including left perihilar mass and mediastinal/hilar adenopathy.  CT Chest A/P 09/08/23 IMPRESSION: 1. Stable left perihilar mass extending into the left upper lobe. 2. Stable left hilar and mediastinal lymphadenopathy. 3. Stable osseous metastatic disease. 4. Stable tree-in-bud opacities and ill-defined ground-glass nodular opacities in the left upper lobe. 5. No acute localizing process in the abdomen or pelvis. 6. Large amount of stool throughout the colon. 7. Aortic atherosclerosis.  Assessment/Plan: Encounter Diagnoses  Name Primary?   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 Symptoms include voice loss, and reduced cough strength. No aspiration pneumonia. Atrophic and bowed vocal cord suggests chronicity, but he experienced voice change 2 weeks ago. We discussed observation vs injection augmentation, and he would like to try it. Will consider medialization thyroplasty after 1 year.  - Schedule procedure for vocal cord injection augmentation - Advise bringing a ride for procedure.  Stage IV lung cancer  - continue to see  Oncology/Palliative as scheduled  Update 10/28/23 Left vocal cord paralysis, Chronic dysphonia Improved closure with stronger voice today 2.5 weeks after Restylane injection augmentation. Slight hoarseness expected to improve. Injection effect to last at least nine months. - Schedule follow-up in 7-8 months to re-evaluate vocal cords. - Advised him to contact if symptoms decline before scheduled follow-up.      Thank you for allowing me to participate in the care of this patient. Please do not hesitate to contact me with any questions or concerns.   Elena Larry, MD Otolaryngology [facility] Phone: [phone] Fax: [phone]    10/28/2023, 9:43 AM

## 2023-10-29 NOTE — Telephone Encounter (Signed)
 Done

## 2023-10-31 ENCOUNTER — Other Ambulatory Visit: Payer: Self-pay | Admitting: Family Medicine

## 2023-10-31 DIAGNOSIS — K219 Gastro-esophageal reflux disease without esophagitis: Secondary | ICD-10-CM

## 2023-11-10 ENCOUNTER — Encounter: Payer: Self-pay | Admitting: Nurse Practitioner

## 2023-11-10 ENCOUNTER — Inpatient Hospital Stay: Admitting: Nurse Practitioner

## 2023-11-10 ENCOUNTER — Inpatient Hospital Stay

## 2023-11-10 ENCOUNTER — Inpatient Hospital Stay: Attending: Internal Medicine

## 2023-11-10 ENCOUNTER — Inpatient Hospital Stay: Attending: Internal Medicine | Admitting: Internal Medicine

## 2023-11-10 ENCOUNTER — Inpatient Hospital Stay: Admitting: Dietician

## 2023-11-10 VITALS — BP 116/68 | HR 115 | Temp 97.2°F | Resp 17 | Ht 69.0 in | Wt 119.9 lb

## 2023-11-10 DIAGNOSIS — Z515 Encounter for palliative care: Secondary | ICD-10-CM | POA: Diagnosis not present

## 2023-11-10 DIAGNOSIS — Z5112 Encounter for antineoplastic immunotherapy: Secondary | ICD-10-CM | POA: Insufficient documentation

## 2023-11-10 DIAGNOSIS — G893 Neoplasm related pain (acute) (chronic): Secondary | ICD-10-CM | POA: Diagnosis not present

## 2023-11-10 DIAGNOSIS — Z5189 Encounter for other specified aftercare: Secondary | ICD-10-CM | POA: Diagnosis not present

## 2023-11-10 DIAGNOSIS — Z87891 Personal history of nicotine dependence: Secondary | ICD-10-CM | POA: Diagnosis not present

## 2023-11-10 DIAGNOSIS — C3412 Malignant neoplasm of upper lobe, left bronchus or lung: Secondary | ICD-10-CM | POA: Insufficient documentation

## 2023-11-10 DIAGNOSIS — Z5111 Encounter for antineoplastic chemotherapy: Secondary | ICD-10-CM | POA: Insufficient documentation

## 2023-11-10 DIAGNOSIS — C3492 Malignant neoplasm of unspecified part of left bronchus or lung: Secondary | ICD-10-CM

## 2023-11-10 DIAGNOSIS — C3490 Malignant neoplasm of unspecified part of unspecified bronchus or lung: Secondary | ICD-10-CM

## 2023-11-10 DIAGNOSIS — R630 Anorexia: Secondary | ICD-10-CM

## 2023-11-10 DIAGNOSIS — C7951 Secondary malignant neoplasm of bone: Secondary | ICD-10-CM | POA: Insufficient documentation

## 2023-11-10 DIAGNOSIS — R634 Abnormal weight loss: Secondary | ICD-10-CM | POA: Diagnosis not present

## 2023-11-10 DIAGNOSIS — Z79620 Long term (current) use of immunosuppressive biologic: Secondary | ICD-10-CM | POA: Diagnosis not present

## 2023-11-10 DIAGNOSIS — R530 Neoplastic (malignant) related fatigue: Secondary | ICD-10-CM | POA: Diagnosis not present

## 2023-11-10 LAB — CBC WITH DIFFERENTIAL (CANCER CENTER ONLY)
Abs Immature Granulocytes: 0.02 K/uL (ref 0.00–0.07)
Basophils Absolute: 0 K/uL (ref 0.0–0.1)
Basophils Relative: 0 %
Eosinophils Absolute: 0 K/uL (ref 0.0–0.5)
Eosinophils Relative: 0 %
HCT: 34.7 % — ABNORMAL LOW (ref 39.0–52.0)
Hemoglobin: 11.5 g/dL — ABNORMAL LOW (ref 13.0–17.0)
Immature Granulocytes: 0 %
Lymphocytes Relative: 7 %
Lymphs Abs: 0.5 K/uL — ABNORMAL LOW (ref 0.7–4.0)
MCH: 29 pg (ref 26.0–34.0)
MCHC: 33.1 g/dL (ref 30.0–36.0)
MCV: 87.6 fL (ref 80.0–100.0)
Monocytes Absolute: 0.6 K/uL (ref 0.1–1.0)
Monocytes Relative: 8 %
Neutro Abs: 6 K/uL (ref 1.7–7.7)
Neutrophils Relative %: 85 %
Platelet Count: 426 K/uL — ABNORMAL HIGH (ref 150–400)
RBC: 3.96 MIL/uL — ABNORMAL LOW (ref 4.22–5.81)
RDW: 13.6 % (ref 11.5–15.5)
WBC Count: 7.1 K/uL (ref 4.0–10.5)
nRBC: 0 % (ref 0.0–0.2)

## 2023-11-10 LAB — TSH: TSH: 1.09 u[IU]/mL (ref 0.350–4.500)

## 2023-11-10 LAB — CMP (CANCER CENTER ONLY)
ALT: 35 U/L (ref 0–44)
AST: 24 U/L (ref 15–41)
Albumin: 3.8 g/dL (ref 3.5–5.0)
Alkaline Phosphatase: 80 U/L (ref 38–126)
Anion gap: 9 (ref 5–15)
BUN: 21 mg/dL (ref 8–23)
CO2: 28 mmol/L (ref 22–32)
Calcium: 9.6 mg/dL (ref 8.9–10.3)
Chloride: 96 mmol/L — ABNORMAL LOW (ref 98–111)
Creatinine: 0.94 mg/dL (ref 0.61–1.24)
GFR, Estimated: >60 mL/min (ref >60)
Glucose, Bld: 148 mg/dL — ABNORMAL HIGH (ref 70–99)
Potassium: 4.1 mmol/L (ref 3.5–5.1)
Sodium: 133 mmol/L — ABNORMAL LOW (ref 135–145)
Total Bilirubin: 0.2 mg/dL (ref 0.0–1.2)
Total Protein: 8.3 g/dL — ABNORMAL HIGH (ref 6.5–8.1)

## 2023-11-10 MED ORDER — DENOSUMAB 120 MG/1.7ML ~~LOC~~ SOLN
120.0000 mg | Freq: Once | SUBCUTANEOUS | Status: AC
  Administered 2023-11-10: 120 mg via SUBCUTANEOUS
  Filled 2023-11-10: qty 1.7

## 2023-11-10 MED ORDER — SODIUM CHLORIDE 0.9 % IV SOLN: Freq: Once | INTRAVENOUS | Status: AC

## 2023-11-10 MED ORDER — SODIUM CHLORIDE 0.9 % IV SOLN
350.0000 mg | Freq: Once | INTRAVENOUS | Status: AC
  Administered 2023-11-10: 350 mg via INTRAVENOUS
  Filled 2023-11-10: qty 7

## 2023-11-10 NOTE — Progress Notes (Signed)
 Nutrition Follow-up:  Pt with stage IV SCC of left lung to bone. He is currently receiving maintenance Libtayo  q21d. Patient is under the care of Dr. Sherrod.   Met with patient in infusion. He is doing well today. Reports recent bout of arthritic hip pain. He felt miserable for days. Patient said he did not eat much during this time. Pain is now managed with scheduled pain relief. His appetite has improved. Patient endorses eating well and supplementing with premier. Says he will pick up a few more pounds by next treatment.    Medications: reviewed  Labs: glucose 148, Na 133  Anthropometrics: Wt 119 lb 14.4 oz today which is underwt for BMI decreased 4% in 4 weeks - significant   9/9 - 124 lb 8/19 - 125 lb    NUTRITION DIAGNOSIS: Unintended wt loss - continues     INTERVENTION:  Encourage high calorie high protein foods  Encouraged to increase ONS if not eating well Continue premier, suggested switching to higher calorie shake given wt loss    MONITORING, EVALUATION, GOAL: wt trends, intake    NEXT VISIT: Wednesday October 22 during infusion

## 2023-11-10 NOTE — Progress Notes (Signed)
 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   Molecular studies by [email or web address] showed no actionable mutations and PD-L1 expression of 70%.  PRIOR THERAPY:  1) Palliative radiotherapy to the T8 and L4 metastatic bone disease. 2) Systemic chemotherapy with carboplatin  for AUC of 5, paclitaxel  175 Mg/M2 and Libtayo  (Cempilimab) 350 Mg IV every 3 weeks with Neulasta  support for 4 cycles    CURRENT THERAPY: Maintenance treatment with single agent Libtayo  (Cempilimab) 350 Mg IV every 3 weeks.  First dose September 08, 2022.  Status post 19 cycles.  INTERVAL HISTORY: Albert Hardy. 68 y.o. male returns to the clinic today for follow-up visit. Discussed the use of AI scribe software for clinical note transcription with the patient, who gave verbal consent to proceed.  History of Present Illness Albert Hardy. is a 68 year old male with stage four squamous cell carcinoma of the lung who presents for evaluation before starting cycle number twenty of Libtayo .  He has noticed a new 'knot' on his left buttock area, which appeared about two weeks ago. Initially small, it has since increased in size to approximately one to two centimeters. It is not sore.  His breathing is variable, with some days being better than others. No chest pain is present, but he occasionally experiences a 'raw' sensation in his esophagus, which he attributes to prior radiation therapy.  He notes a recent weight loss of about three pounds, which he attributes to a lack of appetite due to arthritis  pain. He had been maintaining his weight around 124 pounds until this recent change. No nausea or vomiting.    MEDICAL HISTORY: Past Medical History:  Diagnosis Date   Aortic regurgitation    Aortic stenosis 04/04/2018   Atherosclerosis of native arteries of the extremities with intermittent claudication 09/08/2013   Atherosclerosis of native artery of extremity with intermittent claudication 09/08/2013   IMO SNOMED Dx Update Oct 2024     Bilateral impacted cerumen 10/13/2018   Bilateral sensorineural hearing loss 12/14/2018   Cancer (HCC)    lung   Dyspnea    Encounter for antineoplastic chemotherapy 08/18/2022   Encounter for antineoplastic immunotherapy 08/18/2022   Former moderate cigarette smoker (10-19 per day) 05/20/2012   Quit smoking Nov 2013 when hospitalized w/ HTN and chest pain(diagnosed w/ valvular heart disease)   GERD (gastroesophageal reflux disease)    Heart murmur    History of radiation therapy    Lumbar Spine, Thoracic Spine- 06/25/22-07/10/22- Dr. Lynwood Nasuti   History of radiation therapy    Left lung-07/29/23-08/11/23- Dr. 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  MEDICATIONS:  Current Outpatient Medications  Medication Sig Dispense Refill   albuterol  (  VENTOLIN  HFA) 108 (90 Base) MCG/ACT inhaler Inhale 2 puffs into the lungs every 6 (six) hours as needed for wheezing or shortness of breath. 8 g 6   Ascorbic Acid (VITAMIN C PO) Take 1 tablet by mouth daily.     benzonatate  (TESSALON ) 100 MG capsule Take 1 capsule (100 mg total) by mouth 3 (three) times daily as needed. 30 capsule 2   Cholecalciferol (VITAMIN D3)  1000 UNITS CAPS Take 1,000 Units by mouth daily.     Coenzyme Q10 (CO Q 10 PO) Take 1 tablet by mouth daily.     Cyanocobalamin (VITAMIN B-12 PO) Take 1 tablet by mouth daily.     cyclobenzaprine (FLEXERIL) 5 MG tablet Take 1 tablet (5 mg total) by mouth 3 (three) times daily as needed for muscle spasms. 30 tablet 0   ELIQUIS  5 MG TABS tablet Take 1 tablet by mouth twice daily 60 tablet 0   ezetimibe  (ZETIA ) 10 MG tablet Take 1 tablet by mouth once daily 90 tablet 0   fluticasone  (FLONASE ) 50 MCG/ACT nasal spray Place 1-2 sprays into both nostrils daily. 16 g 6   gabapentin  (NEURONTIN ) 300 MG capsule Take 1 capsule (300 mg total) by mouth 2 (two) times daily. 60 capsule 0   KRILL OIL PO Take 1 tablet by mouth daily.     levofloxacin  (LEVAQUIN ) 500 MG tablet Take 1 tablet (500 mg total) by mouth daily. 7 tablet 0   lisinopril -hydrochlorothiazide  (ZESTORETIC ) 10-12.5 MG tablet Take 1 tablet by mouth once daily 90 tablet 0   MAGNESIUM PO Take 1 tablet by mouth daily.     methylPREDNISolone  (MEDROL  DOSEPAK) 4 MG TBPK tablet Take with signs of chronic sinusitis and take as directed 1 each 1   metoCLOPramide  (REGLAN ) 10 MG tablet Take 1 tablet (10 mg total) by mouth 4 (four) times daily -  before meals and at bedtime. 56 tablet 0   metoprolol  tartrate (LOPRESSOR ) 25 MG tablet Take 1 tablet (25 mg total) by mouth once for 1 dose. Please take this medication 2 hours before CT 1 tablet 0   morphine  (MS CONTIN ) 15 MG 12 hr tablet Take 1 tablet (15 mg total) by mouth every 12 (twelve) hours. 60 tablet 0   Multiple Vitamins-Minerals (CENTRUM SILVER PO) Take 1 tablet by mouth daily.     Multiple Vitamins-Minerals (ZINC PO) Take 1 tablet by mouth daily.     Naphazoline-Pheniramine (OPCON-A) 0.027-0.315 % SOLN Place 1 drop into both eyes daily as needed (redness).     nitroGLYCERIN  (NITROSTAT ) 0.4 MG SL tablet Place 1 tablet (0.4 mg total) under the tongue every 5 (five) minutes as needed for chest pain. 25  tablet 1   omeprazole  (PRILOSEC) 20 MG capsule Take 1 capsule (20 mg total) by mouth daily. 30 capsule 0   omeprazole  (PRILOSEC) 20 MG capsule Take 1 capsule by mouth once daily 30 capsule 0   oxyCODONE  (OXY IR/ROXICODONE ) 5 MG immediate release tablet Take 1 tablet (5 mg total) by mouth every 4 (four) hours as needed for severe pain (pain score 7-10) or breakthrough pain. 90 tablet 0   potassium chloride  SA (KLOR-CON  M) 20 MEQ tablet Take 1 tablet (20 mEq total) by mouth daily. 6 tablet 0   prochlorperazine  (COMPAZINE ) 10 MG tablet Take 1 tablet (10 mg total) by mouth every 6 (six) hours as needed for nausea or vomiting. 30 tablet 0   rosuvastatin  (CRESTOR ) 5 MG tablet Take 1 tablet (5 mg total) by mouth at bedtime.  90 tablet 1   sucralfate  (CARAFATE ) 1 g tablet Take 1 tablet (1 g total) by mouth 4 (four) times daily -  with meals and at bedtime. Crush and dissolve in 10 mL's of warm water prior to swallowing, take 20 min prior to meals 60 tablet 1   triamcinolone  0.1%-Eucerin equivalent 1:1 cream mixture Apply topically 3 (three) times daily as needed. 480 g 2   TURMERIC PO Take 1 tablet by mouth 3 (three) times a week.     umeclidinium-vilanterol (ANORO ELLIPTA ) 62.5-25 MCG/ACT AEPB Inhale 1 puff into the lungs daily. 180 each 3   No current facility-administered medications for this visit.    SURGICAL HISTORY:  Past Surgical History:  Procedure Laterality Date   BRONCHIAL BIOPSY  05/26/2022   Procedure: BRONCHIAL BIOPSIES;  Surgeon: Brenna Adine CROME, DO;  Location: [facility];  Service: Pulmonary;;   BRONCHIAL BRUSHINGS  05/26/2022   Procedure: BRONCHIAL BRUSHINGS;  Surgeon: Brenna Adine CROME, DO;  Location: [facility];  Service: Pulmonary;;   NO PAST SURGERIES     VIDEO BRONCHOSCOPY  05/26/2022   Procedure: VIDEO BRONCHOSCOPY WITHOUT FLUORO;  Surgeon: Brenna Adine CROME, DO;  Location: [facility];  Service: Pulmonary;;    REVIEW OF SYSTEMS:  A comprehensive review of systems was  negative except for: Constitutional: positive for fatigue and weight loss Musculoskeletal: positive for arthralgias and bone pain   PHYSICAL EXAMINATION: General appearance: alert, cooperative, fatigued, and no distress Head: Normocephalic, without obvious abnormality, atraumatic Neck: no adenopathy, no JVD, supple, symmetrical, trachea midline, and thyroid  not enlarged, symmetric, no tenderness/mass/nodules Lymph nodes: Cervical, supraclavicular, and axillary nodes normal. Resp: clear to auscultation bilaterally Back: symmetric, no curvature. ROM normal. No CVA tenderness. Cardio: regular rate and rhythm, S1, S2 normal, no murmur, click, rub or gallop GI: soft, non-tender; bowel sounds normal; no masses,  no organomegaly Extremities: extremities normal, atraumatic, no cyanosis or edema  ECOG PERFORMANCE STATUS: 1 - Symptomatic but completely ambulatory  Blood pressure 116/68, pulse (!) 115, temperature (!) 97.2 F (36.2 C), resp. rate 17, height 5' 9 (1.753 m), weight 119 lb 14.4 oz (54.4 kg), SpO2 97%.  LABORATORY DATA: Lab Results  Component Value Date   WBC 7.1 11/10/2023   HGB 11.5 (L) 11/10/2023   HCT 34.7 (L) 11/10/2023   MCV 87.6 11/10/2023   PLT 426 (H) 11/10/2023      Chemistry      Component Value Date/Time   NA 134 (L) 10/19/2023 0941   NA 146 (H) 01/05/2023 1114   K 4.1 10/19/2023 0941   CL 100 10/19/2023 0941   CO2 25 10/19/2023 0941   BUN 22 10/19/2023 0941   BUN 11 01/05/2023 1114   CREATININE 0.98 10/19/2023 0941   CREATININE 1.07 08/27/2014 1511      Component Value Date/Time   CALCIUM  9.0 10/19/2023 0941   ALKPHOS 66 10/19/2023 0941   AST 16 10/19/2023 0941   ALT 19 10/19/2023 0941   BILITOT 0.3 10/19/2023 0941       RADIOGRAPHIC STUDIES: DG Lumbar Spine 2-3 Views Result Date: 10/19/2023 CLINICAL DATA:  Worsening back pain. History of metastatic lung cancer. EXAM: LUMBAR SPINE - 2-3 VIEW COMPARISON:  CT of the abdomen and pelvis 09/08/2023.  PET-CT 06/04/2022. Lumbar spine CT 06/07/2022. FINDINGS: There are 5 lumbar type vertebral bodies. The alignment is stable and near anatomic. Unchanged mixed lytic and sclerotic lesion within the right aspect of the L4 vertebral body, corresponding with a metastasis on prior imaging. No new lesions,  pathologic fractures or traumatic subluxations demonstrated. There is multilevel spondylosis with advanced disc space narrowing at L5-S1. There is multilevel facet arthropathy. IMPRESSION: 1. No acute osseous findings or significant malalignment. 2. Grossly stable appearance of known L4 metastasis. 3. Multilevel spondylosis. Electronically Signed   By: Elsie Perone M.D.   On: 10/19/2023 13:51     ASSESSMENT AND PLAN: This is a very pleasant 68 years old African-American male with Stage IV (T2a, N2, M1b) non-small cell lung cancer, squamous cell carcinoma presented with left upper lobe lung mass in addition to AP window lymphadenopathy and suspicious bone metastasis to the T8 and L4 vertebrae as well as right retroperitoneal metastatic nodule diagnosed in April 2024.  Molecular studies by [email or web address] showed no actionable mutations and PD-L1 expression of 70%. The patient underwent palliative combination of systemic chemotherapy with carboplatin  for AUC of 5, paclitaxel  175 Mg/M2 and immunotherapy with Libtayo  (Cempilimab) 350 Mg IV every 3 weeks with Neulasta  support for 4 cycles followed by maintenance treatment with Libtayo  (Cempilimab) of the patient has no disease progression after cycle #4.  He is status post 4 cycles. He is currently undergoing maintenance treatment with single agent Libtayo  (Cempilimab) 350 Mg IV every 3 weeks status post 19 cycles.  He has been tolerating this treatment fairly well. He is also status post palliative radiotherapy to the left hilar mass under the care of Dr. Shannon completed August 11, 2023. Assessment and Plan Assessment & Plan Stage IV squamous cell carcinoma of the  lung Stage IV squamous cell carcinoma of the lung, diagnosed in April 2024, with PD-L1 expression of 70% and no actionable mutations. Currently on maintenance treatment with Libtayo  every three weeks, post 19 cycles. No new chest pain, nausea, or vomiting reported. Breathing issues are intermittent, and esophageal irritation is noted as a side effect of prior radiation. - Administer Libtayo  for cycle 20  Left buttock mass concerning for malignancy or metastasis Newly developed mass in the left buttock area, approximately 1-2 cm in size, noticed two weeks ago and has increased in size. No associated pain or signs of infection. Differential diagnosis includes malignancy or metastasis. - Order scan in two weeks to evaluate the left buttock mass - Consider radiation or surgical removal based on scan results  Esophageal irritation secondary to prior radiation Intermittent esophageal irritation, likely secondary to prior radiation therapy.  Unintentional weight loss Reported weight loss of approximately three pounds, attributed to decreased appetite due to arthritis-related pain. Previously maintained weight around 124 pounds. He was advised to call immediately if he has any other concerning symptoms in the interval.  The patient voices understanding of current disease status and treatment options and is in agreement with the current care plan.  All questions were answered. The patient knows to call the clinic with any problems, questions or concerns. We can certainly see the patient much sooner if necessary.  The total time spent in the appointment was 20 minutes.  Disclaimer: This note was dictated with voice recognition software. Similar sounding words can inadvertently be transcribed and may not be corrected upon review.

## 2023-11-10 NOTE — Patient Instructions (Signed)
 [facility] - A DEPT OF [facility]  Discharge Instructions: Thank you for choosing [facility] to provide your oncology and hematology care.   If you have a lab appointment with the Cancer Center, please go directly to the Cancer Center and check in at the registration area.   Wear comfortable clothing and clothing appropriate for easy access to any Portacath or PICC line.   We strive to give you quality time with your provider. You may need to reschedule your appointment if you arrive late (15 or more minutes).  Arriving late affects you and other patients whose appointments are after yours.  Also, if you miss three or more appointments without notifying the office, you may be dismissed from the clinic at the provider's discretion.      For prescription refill requests, have your pharmacy contact our office and allow 72 hours for refills to be completed.    Today you received the following chemotherapy and/or immunotherapy agents: Libtayo , Xgeva       To help prevent nausea and vomiting after your treatment, we encourage you to take your nausea medication as directed.  BELOW ARE SYMPTOMS THAT SHOULD BE REPORTED IMMEDIATELY: *FEVER GREATER THAN 100.4 F (38 C) OR HIGHER *CHILLS OR SWEATING *NAUSEA AND VOMITING THAT IS NOT CONTROLLED WITH YOUR NAUSEA MEDICATION *UNUSUAL SHORTNESS OF BREATH *UNUSUAL BRUISING OR BLEEDING *URINARY PROBLEMS (pain or burning when urinating, or frequent urination) *BOWEL PROBLEMS (unusual diarrhea, constipation, pain near the anus) TENDERNESS IN MOUTH AND THROAT WITH OR WITHOUT PRESENCE OF ULCERS (sore throat, sores in mouth, or a toothache) UNUSUAL RASH, SWELLING OR PAIN  UNUSUAL VAGINAL DISCHARGE OR ITCHING   Items with * indicate a potential emergency and should be followed up as soon as possible or go to the Emergency Department if any problems should occur.  Please show the CHEMOTHERAPY ALERT CARD or  IMMUNOTHERAPY ALERT CARD at check-in to the Emergency Department and triage nurse.  Should you have questions after your visit or need to cancel or reschedule your appointment, please contact [facility] - A DEPT OF [facility]  Dept: [phone]  and follow the prompts.  Office hours are 8:00 a.m. to 4:30 p.m. Monday - Friday. Please note that voicemails left after 4:00 p.m. may not be returned until the following business day.  We are closed weekends and major holidays. You have access to a nurse at all times for urgent questions. Please call the main number to the clinic Dept: [phone] and follow the prompts.   For any non-urgent questions, you may also contact your provider using MyChart. We now offer e-Visits for anyone 65 and older to request care online for non-urgent symptoms. For details visit [email or web address].   Also download the MyChart app! Go to the app store, search MyChart, open the app, select [facility], and log in with your MyChart username and password.

## 2023-11-10 NOTE — Progress Notes (Signed)
 Palliative Medicine [facility]  [phone] [phone]   Name: Albert Hardy. Date: 11/10/2023 MRN: [id number]  DOB: 11-Jan-1956  Patient Care Team: [name], MD as PCP - General (Family Medicine)    INTERVAL HISTORY: Albert Hardy. is a 68 y.o. 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 Palliative ask to see for symptom management and goals of care   SOCIAL HISTORY:     reports that he quit smoking about 11 years ago. His smoking use included cigarettes. He started smoking about 41 years ago. He has a 30 pack-year smoking history. He has never used smokeless tobacco. He reports current alcohol use. He reports current drug use. Frequency: 1.00 time per week. Drug: Marijuana.  ADVANCE DIRECTIVES:  None on file  CODE STATUS: Full code  PAST MEDICAL HISTORY: Past Medical History:  Diagnosis Date   Aortic regurgitation    Aortic stenosis 04/04/2018   Atherosclerosis of native arteries of the extremities with intermittent claudication 09/08/2013   Atherosclerosis of native artery of extremity with intermittent claudication 09/08/2013   IMO SNOMED Dx Update Oct 2024     Bilateral impacted cerumen 10/13/2018   Bilateral sensorineural hearing loss 12/14/2018   Cancer (HCC)    lung   Dyspnea    Encounter for antineoplastic chemotherapy 08/18/2022   Encounter for antineoplastic immunotherapy 08/18/2022   Former moderate cigarette smoker (10-19 per day) 05/20/2012   Quit smoking Nov 2013 when hospitalized w/ HTN and chest pain(diagnosed w/ valvular heart disease)   GERD (gastroesophageal reflux disease)    Heart murmur    History of radiation therapy    Lumbar Spine, Thoracic Spine- 06/25/22-07/10/22- Dr. Lynwood Nasuti   History of radiation therapy    Left lung-07/29/23-08/11/23- Dr. 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  MEDICATIONS:  Current Outpatient Medications  Medication Sig Dispense Refill   albuterol  (VENTOLIN  HFA) 108 (90 Base) MCG/ACT inhaler Inhale 2 puffs into the lungs every 6 (six) hours as needed for wheezing or shortness of breath. 8 g 6   Ascorbic Acid (VITAMIN C PO) Take 1 tablet by mouth daily.     benzonatate  (TESSALON ) 100 MG capsule Take 1 capsule (100 mg total) by mouth 3 (three) times daily as needed. 30 capsule 2   Cholecalciferol (VITAMIN D3) 1000 UNITS CAPS Take 1,000 Units by mouth daily.     Coenzyme Q10 (CO Q 10 PO) Take 1 tablet by mouth daily.     Cyanocobalamin (VITAMIN B-12 PO) Take 1 tablet by mouth daily.     cyclobenzaprine (FLEXERIL) 5 MG tablet Take 1 tablet (5 mg total) by mouth 3 (three) times daily as needed for muscle spasms. 30 tablet 0   ELIQUIS  5 MG TABS tablet Take 1 tablet by mouth twice daily 60 tablet 0   ezetimibe  (ZETIA ) 10 MG tablet Take 1 tablet by mouth once daily 90 tablet 0   fluticasone  (FLONASE ) 50 MCG/ACT nasal spray Place 1-2 sprays into both nostrils daily. 16 g 6   gabapentin  (NEURONTIN ) 300 MG capsule Take  1 capsule (300 mg total) by mouth 2 (two) times daily. 60 capsule 0   KRILL OIL PO Take 1 tablet by mouth daily.     levofloxacin  (LEVAQUIN ) 500 MG tablet Take 1 tablet (500 mg total) by mouth daily. 7 tablet 0   lisinopril -hydrochlorothiazide  (ZESTORETIC ) 10-12.5 MG tablet Take 1 tablet by mouth once daily 90 tablet 0   MAGNESIUM PO Take 1 tablet by mouth daily.     methylPREDNISolone  (MEDROL  DOSEPAK) 4 MG TBPK tablet Take with signs of chronic sinusitis and take as directed 1 each 1   metoCLOPramide  (REGLAN ) 10 MG tablet Take 1 tablet  (10 mg total) by mouth 4 (four) times daily -  before meals and at bedtime. 56 tablet 0   metoprolol  tartrate (LOPRESSOR ) 25 MG tablet Take 1 tablet (25 mg total) by mouth once for 1 dose. Please take this medication 2 hours before CT 1 tablet 0   morphine  (MS CONTIN ) 15 MG 12 hr tablet Take 1 tablet (15 mg total) by mouth every 12 (twelve) hours. 60 tablet 0   Multiple Vitamins-Minerals (CENTRUM SILVER PO) Take 1 tablet by mouth daily.     Multiple Vitamins-Minerals (ZINC PO) Take 1 tablet by mouth daily.     Naphazoline-Pheniramine (OPCON-A) 0.027-0.315 % SOLN Place 1 drop into both eyes daily as needed (redness).     nitroGLYCERIN  (NITROSTAT ) 0.4 MG SL tablet Place 1 tablet (0.4 mg total) under the tongue every 5 (five) minutes as needed for chest pain. 25 tablet 1   omeprazole  (PRILOSEC) 20 MG capsule Take 1 capsule (20 mg total) by mouth daily. 30 capsule 0   omeprazole  (PRILOSEC) 20 MG capsule Take 1 capsule by mouth once daily 30 capsule 0   oxyCODONE  (OXY IR/ROXICODONE ) 5 MG immediate release tablet Take 1 tablet (5 mg total) by mouth every 4 (four) hours as needed for severe pain (pain score 7-10) or breakthrough pain. 90 tablet 0   potassium chloride  SA (KLOR-CON  M) 20 MEQ tablet Take 1 tablet (20 mEq total) by mouth daily. 6 tablet 0   prochlorperazine  (COMPAZINE ) 10 MG tablet Take 1 tablet (10 mg total) by mouth every 6 (six) hours as needed for nausea or vomiting. 30 tablet 0   rosuvastatin  (CRESTOR ) 5 MG tablet Take 1 tablet (5 mg total) by mouth at bedtime. 90 tablet 1   sucralfate  (CARAFATE ) 1 g tablet Take 1 tablet (1 g total) by mouth 4 (four) times daily -  with meals and at bedtime. Crush and dissolve in 10 mL's of warm water prior to swallowing, take 20 min prior to meals 60 tablet 1   triamcinolone  0.1%-Eucerin equivalent 1:1 cream mixture Apply topically 3 (three) times daily as needed. 480 g 2   TURMERIC PO Take 1 tablet by mouth 3 (three) times a week.      umeclidinium-vilanterol (ANORO ELLIPTA ) 62.5-25 MCG/ACT AEPB Inhale 1 puff into the lungs daily. 180 each 3   No current facility-administered medications for this visit.    VITAL SIGNS: There were no vitals taken for this visit. There were no vitals filed for this visit.  Estimated body mass index is 17.71 kg/m as calculated from the following:   Height as of an earlier encounter on 11/10/23: 5' 9 (1.753 m).   Weight as of an earlier encounter on 11/10/23: 119 lb 14.4 oz (54.4 kg).     Latest Ref Rng & Units 11/10/2023   10:03 AM 10/19/2023    9:41 AM 09/28/2023  8:32 AM  CBC  WBC 4.0 - 10.5 K/uL 7.1  7.4  3.9   Hemoglobin 13.0 - 17.0 g/dL 88.4  89.2  88.9   Hematocrit 39.0 - 52.0 % 34.7  31.7  32.9   Platelets 150 - 400 K/uL 426  221  229        Latest Ref Rng & Units 11/10/2023   10:03 AM 10/19/2023    9:41 AM 09/28/2023    8:32 AM  CMP  Glucose 70 - 99 mg/dL 851  851  850   BUN 8 - 23 mg/dL 21  22  20    Creatinine 0.61 - 1.24 mg/dL 9.05  9.01  9.00   Sodium 135 - 145 mmol/L 133  134  137   Potassium 3.5 - 5.1 mmol/L 4.1  4.1  3.8   Chloride 98 - 111 mmol/L 96  100  101   CO2 22 - 32 mmol/L 28  25  26    Calcium  8.9 - 10.3 mg/dL 9.6  9.0  9.1   Total Protein 6.5 - 8.1 g/dL 8.3  7.2  7.4   Total Bilirubin 0.0 - 1.2 mg/dL 0.2  0.3  0.3   Alkaline Phos 38 - 126 U/L 80  66  63   AST 15 - 41 U/L 24  16  18    ALT 0 - 44 U/L 35  19  21     PERFORMANCE STATUS (ECOG) : 1 - Symptomatic but completely ambulatory   Physical Exam General: NAD Cardiovascular: RRR Pulmonary: normal breathing pattern  Extremities: no edema, no joint deformities Skin: no rashes Neurological: AAO x4  IMPRESSION: Discussed the use of AI scribe software for clinical note transcription with the patient, who gave verbal consent to proceed.  History of Present Illness Dempsy Damiano. is a 68 year old male who was seen during infusion for symptom management follow-up. Denies nausea, vomiting,  constipation, or diarrhea. Appetite fluctuates. Some days are better than others. Weight continues to fluctuate down to 119lbs from 124lbs. He continues to take things one day at a time.   Mr. Prather reports his pain overall has been well controlled on current regimen. He is currently taking MS Contin  15mg  every 12 hours. During episodes of severe pain. He manages the pain with Tylenol  Arthritis and has started taking Oxycodone  every 4 hours as needed, which significantly alleviates the pain.  We will continue to closely follow and support. All questions answered and support provided.  Goals of Care  06/18/22-  We discussed his current illness and what it means in the larger context of hsi on-going co-morbidities. Natural disease trajectory and expectations were discussed.  Mr. Bishop and his wife are realistic in their expectations and understanding of his incurable cancer. He knows all treatment is palliative focused. Wishes to continue taking things one day at a time focusing on his quality of life allowing him every opportunity to continue to thrive.   We discussed Her current illness and what it means in the larger context of Her on-going co-morbidities. Natural disease trajectory and expectations were discussed.  I discussed the importance of continued conversation with family and their medical providers regarding overall plan of care and treatment options, ensuring decisions are within the context of the patients values and GOCs. Assessment & Plan Appetite fluctuations and weight loss Intermittent appetite fluctuations with associated weight loss.  Weight is stable at 124 pounds.  Appetite is influenced by stomach discomfort and food aversions. Encouraged by weight gain  but experiences frustration with fluctuations. - Encourage consumption of small, frequent meals - Advise trying softer foods like eggs on days with poor appetite - Reinforce the importance of maintaining nutritional  intake  Constipation Constipation remains an ongoing issue, described as an 'adventure' and 'rough at times'. The condition is more steady but still challenging. - Continue daily stool softener as needed for constipation.  Chronic cancer related pain management Pain management is well-controlled with current medications. - Continue MS Contin  15 mg every 12 hours. - Continue oxycodone  5 mg every 4 hours as needed. -Tylenol  every 8 hours as needed   I will plan to follow-up with patient in 4-6 weeks. Sooner if needed.  Patient expressed understanding and was in agreement with this plan. He also understands that He can call the clinic at any time with any questions, concerns, or complaints.   Any controlled substances utilized were prescribed in the context of palliative care. PDMP has been reviewed.   Visit consisted of counseling and education dealing with the complex and emotionally intense issues of symptom management and palliative care in the setting of serious and potentially life-threatening illness.  Levon Borer, AGPCNP-BC  Palliative Medicine Team/[facility]

## 2023-11-11 LAB — T4: T4, Total: 9.4 ug/dL (ref 4.5–12.0)

## 2023-11-12 ENCOUNTER — Other Ambulatory Visit: Payer: Self-pay | Admitting: Nurse Practitioner

## 2023-11-12 DIAGNOSIS — G893 Neoplasm related pain (acute) (chronic): Secondary | ICD-10-CM

## 2023-11-12 DIAGNOSIS — C3492 Malignant neoplasm of unspecified part of left bronchus or lung: Secondary | ICD-10-CM

## 2023-11-12 DIAGNOSIS — Z515 Encounter for palliative care: Secondary | ICD-10-CM

## 2023-11-14 ENCOUNTER — Encounter: Payer: Self-pay | Admitting: Internal Medicine

## 2023-11-15 ENCOUNTER — Other Ambulatory Visit: Payer: Self-pay | Admitting: Nurse Practitioner

## 2023-11-15 DIAGNOSIS — G893 Neoplasm related pain (acute) (chronic): Secondary | ICD-10-CM

## 2023-11-15 DIAGNOSIS — C3492 Malignant neoplasm of unspecified part of left bronchus or lung: Secondary | ICD-10-CM

## 2023-11-15 DIAGNOSIS — Z515 Encounter for palliative care: Secondary | ICD-10-CM

## 2023-11-15 MED ORDER — MORPHINE SULFATE ER 15 MG PO TBCR: 15.0000 mg | EXTENDED_RELEASE_TABLET | Freq: Two times a day (BID) | ORAL | 0 refills | Status: DC

## 2023-11-15 MED ORDER — OXYCODONE HCL 5 MG PO TABS: 5.0000 mg | ORAL_TABLET | ORAL | 0 refills | Status: DC | PRN

## 2023-11-18 ENCOUNTER — Ambulatory Visit: Admitting: Family Medicine

## 2023-11-23 ENCOUNTER — Ambulatory Visit (HOSPITAL_COMMUNITY)
Admission: RE | Admit: 2023-11-23 | Discharge: 2023-11-23 | Disposition: A | Discharge status: Home / Self Care | Source: Ambulatory Visit | Attending: Internal Medicine | Admitting: Internal Medicine

## 2023-11-23 DIAGNOSIS — C3490 Malignant neoplasm of unspecified part of unspecified bronchus or lung: Secondary | ICD-10-CM | POA: Diagnosis present

## 2023-11-23 MED ORDER — IOHEXOL 300 MG/ML  SOLN
100.0000 mL | Freq: Once | INTRAMUSCULAR | Status: AC | PRN
  Administered 2023-11-23: 100 mL via INTRAVENOUS

## 2023-11-26 NOTE — Progress Notes (Unsigned)
 [facility] OFFICE PROGRESS NOTE  Albert Reyes [name], MD [phone] [place] [address]  DIAGNOSIS:  Stage IV (T2a, N2, M1 B) non-small cell lung cancer, squamous cell carcinoma presented with left upper lobe lung mass in addition to AP window lymphadenopathy and suspicious bone metastasis to the T8 and L4 vertebrae in addition to retroperitoneal metastatic soft tissue nodule diagnosed in April 2024.    Molecular studies by [email or web address] showed no actionable mutations and PD-L1 expression of 70%.  PRIOR THERAPY: 1) Palliative radiotherapy to the T8 and L4 metastatic bone disease. 2) Systemic chemotherapy with carboplatin  for AUC of 5, paclitaxel  175 Mg/M2 and Libtayo  (Cempilimab) 350 Mg IV every 3 weeks with Neulasta  support for 4 cycles  3) Palliative radiation to the left hilar mass under the care of Dr. Shannon. Last dose on 08/11/23.  4)  Maintenance treatment with single agent Libtayo  (Cempilimab) 350 Mg IV every 3 weeks.  First dose September 08, 2022.  Status post 20 cycles.   CURRENT THERAPY: 1) docetaxel and Cyramza IV every 3 weeks.  First as expected on 12/08/23.  2) Xgeva  every 6 weeks  INTERVAL HISTORY: Albert Hardy. 68 y.o. male returns to the clinic today for follow-up visit.  The patient was last seen 3 weeks ago by Dr. Sherrod.  The patient is currently on immunotherapy with Libtayo  which he tolerates well.  At the patient's last appointment he was endorsing a knot on his left buttock which has increased in size. Dr. Sherrod ordered a restaging CT scan.  He denies any fever or night sweats.  He reports he is cold at baseline due to the weather change and having lost weight.  He lost weight since last being seen.  He used to drink Premier protein drinks but only once a day at most.  He reports his shortness of breath is about the same.  He also reports his intermittent cough is the same.  He denies any chest pain or hemoptysis.  He takes Senokot for  constipation although he mentions that he is having constipation still.  He denies any nausea, vomiting, or diarrhea.  He denies any rashes or skin changes.  He sometimes has epigastric discomfort.  He denies any headache or visual changes.  He still continues to have hoarseness.  He recently had a restaging CT scan.  He is here today for evaluation and to review his scan results before undergoing cycle #21   MEDICAL HISTORY: Past Medical History:  Diagnosis Date   Aortic regurgitation    Aortic stenosis 04/04/2018   Atherosclerosis of native arteries of the extremities with intermittent claudication 09/08/2013   Atherosclerosis of native artery of extremity with intermittent claudication 09/08/2013   IMO SNOMED Dx Update Oct 2024     Bilateral impacted cerumen 10/13/2018   Bilateral sensorineural hearing loss 12/14/2018   Cancer (HCC)    lung   Dyspnea    Encounter for antineoplastic chemotherapy 08/18/2022   Encounter for antineoplastic immunotherapy 08/18/2022   Former moderate cigarette smoker (10-19 per day) 05/20/2012   Quit smoking Nov 2013 when hospitalized w/ HTN and chest pain(diagnosed w/ valvular heart disease)   GERD (gastroesophageal reflux disease)    Heart murmur    History of radiation therapy    Lumbar Spine, Thoracic Spine- 06/25/22-07/10/22- Dr. Lynwood Albert Hardy   History of radiation therapy    Left lung-07/29/23-08/11/23- Dr. 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  MEDICATIONS:  Current Outpatient Medications  Medication Sig Dispense Refill   azithromycin (ZITHROMAX Z-PAK) 250 MG tablet Take as directed 6 each 0    dexamethasone  (DECADRON ) 4 MG tablet Take 2 tablets TWICE a day the day before, the day of, and the day after chemotherapy 40 tablet 2   prochlorperazine  (COMPAZINE ) 10 MG tablet Take 1 tablet (10 mg total) by mouth every 6 (six) hours as needed. 30 tablet 2   albuterol  (VENTOLIN  HFA) 108 (90 Base) MCG/ACT inhaler Inhale 2 puffs into the lungs every 6 (six) hours as needed for wheezing or shortness of breath. 8 g 6   Ascorbic Acid (VITAMIN C PO) Take 1 tablet by mouth daily.     benzonatate  (TESSALON ) 100 MG capsule Take 1 capsule (100 mg total) by mouth 3 (three) times daily as needed. 30 capsule 2   Cholecalciferol (VITAMIN D3) 1000 UNITS CAPS Take 1,000 Units by mouth daily.     Coenzyme Q10 (CO Q 10 PO) Take 1 tablet by mouth daily.     Cyanocobalamin (VITAMIN B-12 PO) Take 1 tablet by mouth daily.     cyclobenzaprine (FLEXERIL) 5 MG tablet Take 1 tablet (5 mg total) by mouth 3 (three) times daily as needed for muscle spasms. 30 tablet 0   dexamethasone  (DECADRON ) 4 MG tablet Take 2 tabs by mouth 2 times daily starting day before chemo. Then take 2 tabs daily for 2 days starting day after chemo. Take with food. 30 tablet 1   ELIQUIS  5 MG TABS tablet Take 1 tablet by mouth twice daily 60 tablet 0   ezetimibe  (ZETIA ) 10 MG tablet Take 1 tablet by mouth once daily 90 tablet 0   fluticasone  (FLONASE ) 50 MCG/ACT nasal spray Place 1-2 sprays into both nostrils daily. 16 g 6   gabapentin  (NEURONTIN ) 300 MG capsule Take 1 capsule by mouth twice daily 60 capsule 0   KRILL OIL PO Take 1 tablet by mouth daily.     levofloxacin  (LEVAQUIN ) 500 MG tablet Take 1 tablet (500 mg total) by mouth daily. 7 tablet 0   lisinopril -hydrochlorothiazide  (ZESTORETIC ) 10-12.5 MG tablet Take 1 tablet by mouth once daily 90 tablet 0   MAGNESIUM PO Take 1 tablet by mouth daily.     methylPREDNISolone  (MEDROL  DOSEPAK) 4 MG TBPK tablet Take with signs of chronic sinusitis and take as directed 1 each 1   metoCLOPramide   (REGLAN ) 10 MG tablet Take 1 tablet (10 mg total) by mouth 4 (four) times daily -  before meals and at bedtime. 56 tablet 0   metoprolol  tartrate (LOPRESSOR ) 25 MG tablet Take 1 tablet (25 mg total) by mouth once for 1 dose. Please take this medication 2 hours before CT 1 tablet 0   morphine  (MS CONTIN ) 15 MG 12 hr tablet Take 1 tablet (15 mg total) by mouth every 12 (twelve) hours. 60 tablet 0   Multiple Vitamins-Minerals (CENTRUM SILVER PO) Take 1 tablet by mouth daily.     Multiple Vitamins-Minerals (ZINC PO) Take 1 tablet by mouth daily.     Naphazoline-Pheniramine (OPCON-A) 0.027-0.315 % SOLN Place 1 drop into both eyes daily as needed (redness).  nitroGLYCERIN  (NITROSTAT ) 0.4 MG SL tablet Place 1 tablet (0.4 mg total) under the tongue every 5 (five) minutes as needed for chest pain. 25 tablet 1   omeprazole  (PRILOSEC) 20 MG capsule Take 1 capsule (20 mg total) by mouth daily. 30 capsule 0   omeprazole  (PRILOSEC) 20 MG capsule Take 1 capsule by mouth once daily 30 capsule 0   ondansetron  (ZOFRAN ) 8 MG tablet Take 1 tablet (8 mg total) by mouth every 8 (eight) hours as needed for nausea or vomiting. 30 tablet 1   oxyCODONE  (OXY IR/ROXICODONE ) 5 MG immediate release tablet Take 1 tablet (5 mg total) by mouth every 4 (four) hours as needed for severe pain (pain score 7-10) or breakthrough pain. 90 tablet 0   potassium chloride  SA (KLOR-CON  M) 20 MEQ tablet Take 1 tablet (20 mEq total) by mouth daily. 6 tablet 0   prochlorperazine  (COMPAZINE ) 10 MG tablet Take 1 tablet (10 mg total) by mouth every 6 (six) hours as needed for nausea or vomiting. 30 tablet 0   prochlorperazine  (COMPAZINE ) 10 MG tablet Take 1 tablet (10 mg total) by mouth every 6 (six) hours as needed for nausea or vomiting. 30 tablet 1   rosuvastatin  (CRESTOR ) 5 MG tablet TAKE 1 TABLET BY MOUTH AT BEDTIME 90 tablet 0   sucralfate  (CARAFATE ) 1 g tablet Take 1 tablet (1 g total) by mouth 4 (four) times daily -  with meals and at  bedtime. Crush and dissolve in 10 mL's of warm water prior to swallowing, take 20 min prior to meals 60 tablet 1   triamcinolone  0.1%-Eucerin equivalent 1:1 cream mixture Apply topically 3 (three) times daily as needed. 480 g 2   TURMERIC PO Take 1 tablet by mouth 3 (three) times a week.     umeclidinium-vilanterol (ANORO ELLIPTA ) 62.5-25 MCG/ACT AEPB Inhale 1 puff into the lungs daily. 180 each 3   No current facility-administered medications for this visit.    SURGICAL HISTORY:  Past Surgical History:  Procedure Laterality Date   BRONCHIAL BIOPSY  05/26/2022   Procedure: BRONCHIAL BIOPSIES;  Surgeon: Brenna Adine CROME, DO;  Location: [facility];  Service: Pulmonary;;   BRONCHIAL BRUSHINGS  05/26/2022   Procedure: BRONCHIAL BRUSHINGS;  Surgeon: Brenna Adine CROME, DO;  Location: [facility];  Service: Pulmonary;;   NO PAST SURGERIES     VIDEO BRONCHOSCOPY  05/26/2022   Procedure: VIDEO BRONCHOSCOPY WITHOUT FLUORO;  Surgeon: Brenna Adine CROME, DO;  Location: [facility];  Service: Pulmonary;;    REVIEW OF SYSTEMS:   Review of Systems  Constitutional: Positive for weight loss and fatigue. Negative for appetite change, chills, and fever.  HENT: Negative for mouth sores, nosebleeds, sore throat and trouble swallowing.   Eyes: Negative for eye problems and icterus.  Respiratory: Positive for dyspnea on exertion. Negative for wheezing.   Cardiovascular: Negative for chest pain and leg swelling.  Gastrointestinal: Positive for constipation. Negative for abdominal pain,  diarrhea, nausea and vomiting.  Genitourinary: Negative for bladder incontinence, difficulty urinating, dysuria, frequency and hematuria.   Musculoskeletal: Positive for buttock discomfort. Negative for back pain, gait problem, neck pain and neck stiffness.  Skin: Negative for itching and rash.  Neurological: Negative for dizziness, extremity weakness, gait problem, headaches, light-headedness and seizures.  Hematological:  Negative for adenopathy. Does not bruise/bleed easily.  Psychiatric/Behavioral: Negative for confusion, depression and sleep disturbance. The patient is not nervous/anxious.     PHYSICAL EXAMINATION:  Blood pressure (!) 164/85, pulse (!) 101, temperature 99.6 F (37.6 C),  temperature source Temporal, resp. rate 20, weight 116 lb 6.4 oz (52.8 kg), SpO2 99%.  ECOG PERFORMANCE STATUS: 1  Physical Exam  Constitutional: Oriented to person, place, and time and thin appearing male, and in no distress.  HENT:  Head: Normocephalic and atraumatic.  Mouth/Throat: Oropharynx is clear and moist. No oropharyngeal exudate.  Eyes: Conjunctivae are normal. Right eye exhibits no discharge. Left eye exhibits no discharge. No scleral icterus.  Neck: Normal range of motion. Neck supple.  Cardiovascular: Tachycardic, regular rhythm, normal heart sounds and intact distal pulses.   Pulmonary/Chest: Effort normal. Quiet breath sounds bilaterally. No respiratory distress. No wheezes. No rales.  Abdominal: Soft. Bowel sounds are normal. Exhibits no distension and no mass. There is no tenderness.  Musculoskeletal: Some tenderness to palpation over the left hip/low back. Normal range of motion. Exhibits no edema.  Lymphadenopathy:    No cervical adenopathy.  Neurological: Alert and oriented to person, place, and time. Exhibits muscle wasting. Gait normal. Coordination normal.  Skin: Skin is warm and dry. No rash noted. Not diaphoretic. No erythema. No pallor.  Psychiatric: Mood, memory and judgment normal.  Vitals reviewed.  LABORATORY DATA: Lab Results  Component Value Date   WBC 6.1 12/01/2023   HGB 11.4 (L) 12/01/2023   HCT 33.7 (L) 12/01/2023   MCV 86.6 12/01/2023   PLT 318 12/01/2023      Chemistry      Component Value Date/Time   NA 130 (L) 12/01/2023 0904   NA 146 (H) 01/05/2023 1114   K 4.4 12/01/2023 0904   CL 94 (L) 12/01/2023 0904   CO2 28 12/01/2023 0904   BUN 17 12/01/2023 0904   BUN  11 01/05/2023 1114   CREATININE 0.93 12/01/2023 0904   CREATININE 1.07 08/27/2014 1511      Component Value Date/Time   CALCIUM  9.6 12/01/2023 0904   ALKPHOS 83 12/01/2023 0904   AST 17 12/01/2023 0904   ALT 21 12/01/2023 0904   BILITOT 0.3 12/01/2023 0904       RADIOGRAPHIC STUDIES:  CT CHEST ABDOMEN PELVIS W CONTRAST Result Date: 11/25/2023 CLINICAL DATA:  Non-small-cell lung cancer. Restaging. * Tracking Code: BO * EXAM: CT CHEST, ABDOMEN, AND PELVIS WITH CONTRAST TECHNIQUE: Multidetector CT imaging of the chest, abdomen and pelvis was performed following the standard protocol during bolus administration of intravenous contrast. RADIATION DOSE REDUCTION: This exam was performed according to the departmental dose-optimization program which includes automated exposure control, adjustment of the mA and/or kV according to patient size and/or use of iterative reconstruction technique. CONTRAST:  OMNIPAQUE  IOHEXOL  300 MG/ML  SOLN COMPARISON:  09/08/2023 FINDINGS: CT CHEST FINDINGS Cardiovascular: Aortic atherosclerosis. Normal heart size, without pericardial effusion. No central pulmonary embolism, on this non-dedicated study. Mass-effect upon the left pulmonary arterial tree by mass/adenopathy. Mediastinum/Nodes: Pretracheal adenopathy at 1.6 cm on 28/2. Similar to the prior (when remeasured). Subcarinal adenopathy at 1.3 cm on 32/2, new. Left mediastinal nodal mass of 2.0 x 2.6 cm on 30/2. Compare 2.2 x 2.8 cm on the prior. Lungs/Pleura: No pleural fluid. Mass-effect upon left upper and left lower lobe airway is progressive with similar obstruction of the left upper lobe bronchus including on 79/4. The adjacent or contiguous left upper lobe lung lesion and mediastinal/hilar adenopathy are again identified. The central left upper lobe nodule measures 2.6 x 2.2 cm on 71/4 versus 2.9 x 2.1 cm on the prior exam when remeasured in a similar fashion. Upstream left upper lobe mucoid impaction and  peribronchovascular tree-in-bud nodularity  are increased. Consolidation within the adjacent superior segment left lower lobe is new including on 78/4. Musculoskeletal: Included within the abdomen pelvic section. CT ABDOMEN PELVIS FINDINGS Hepatobiliary: Normal liver. Normal gallbladder, without biliary ductal dilatation. Pancreas: Mild pancreatic atrophy with upper normal duct caliber. No cause identified and no significant change. Spleen: Normal in size, without focal abnormality. Adrenals/Urinary Tract: Normal adrenal glands. Normal kidneys, without hydronephrosis. Normal urinary bladder. Stomach/Bowel: Normal stomach, without wall thickening. Normal large and small bowel loops. Vascular/Lymphatic: Aortic atherosclerosis. No abdominopelvic adenopathy. Reproductive: Mild prostatomegaly. Other: No significant free fluid. No evidence of omental or peritoneal disease. Musculoskeletal: An enhancing left paraspinous muscular mass/metastasis, centered about the L2 level, measures 6.1 x 5.2 by 8.3 cm on 74/2. Increased from 4.0 x 3.8 cm on the prior. A new or significantly enlarged subcutaneous metastasis superficial the left gluteals at 1.6 x 2.3 cm on 109/2. An enhancing nodule in the subcutaneous fat superficial the right gluteals of 11 mm on 121/2 is new. Similar appearance of mixed attenuation, primarily sclerotic lesions within L4 and T8. IMPRESSION: 1. Mild disease progression within the chest. Although the central left upper lobe pulmonary nodule is similar to decreased, there is new subcarinal adenopathy, and progressive left-sided endobronchial compression. 2. New central left lower lobe consolidation. Progressive left upper lobe mucoid impaction and peribronchovascular nodularity. Findings are all likely related to postobstructive pneumonitis/pneumonia. 3. Relatively similar osseous metastasis. Progressive intramuscular metastasis including a dominant left paraspinous mass as detailed above. 4. No  intraabdominal/pelvic metastasis identified. Electronically Signed   By: Rockey Kilts M.D.   On: 11/25/2023 16:27     ASSESSMENT/PLAN:  This is a very pleasant 68 year old African-American male with stage IV (T2a, N2, M1 B non-small cell lung cancer, squamous cell carcinoma.  He presented with a left upper lobe lung mass in addition to AP window lymphadenopathy and suspicious for metastases to T8 and L4 vertebrae as well as right retroperitoneal metastatic nodule.  He was diagnosed in April 2024.   His molecular studies by [email or web address] showed no actionable mutation.  His PD-L1 expression is 70%.   He underwent palliative radiation to the metastatic bone lesions.   He also receives Xgeva  every 6 weeks.  He is due for this at his next infusion   He is underwent palliative systemic chemotherapy and immunotherapy with carboplatin  for AUC of 5, paclitaxel  175 mg/m, and immunotherapy with Libtayo  350 mg IV every 3 weeks with Neulasta  support.  He is status post 4 cycles.     He started maintenance immunotherapy with Libtayo . He is status post 20 cycles of maintenance.    His restaging CT scan from June 2025 showed increased size of the left hilar mass which causes central obstruction of a left upper lobe bronchus, increase in the left hilar and mediastinal lymph nodes, and increased in numerous ground glass pulmonary nodules which are nonspecific.  Dr. Sherrod recommended referral to radiation to discuss palliative radiation to the obstructive lung mass.    He underwent radiation to the left hilar mass which was completed on 08/11/23.   The patient was seen with Dr. Sherrod today.  Dr. Sherrod personally and independently reviewed the scan and discussed results with the patient today.  The scan showed evidence of disease progression.    Mohamed had a lengthy discussion with the patient today about his current condition and treatment options. Dr. Sherrod recommended docetaxel and Cyramza. The patient  is interested in this option and is expected to undergo his first cycle of  treatment on 12/08/23.   I have sent prescriptions for Compazine  to the pharmacy as well as Decadron  to take 2 tablets twice a day the day before, day of, the day after chemotherapy.  We will see him for labs and follow up in 2 weeks for a 1 week follow up and manage any adverse side effects of treatment.    He will continue taking his iron supplement for anemia.    Will continue taking his blood thinner for now due to his history of PE..  Will refer him to radiation oncology for palliative radiation to the progressive intramuscular metastasis including a dominant left paraspinous mass.   He is not having any changes in his breathing but the scan mentioned new LLL consolidation. I sent azithromycin the pharmacy.   Given constipation education  Advised to increase his intake of primer protein drinks to 2x per day.    The patient was advised to call immediately if he has any concerning symptoms in the interval. The patient voices understanding of current disease status and treatment options and is in agreement with the current care plan. All questions were answered. The patient knows to call the clinic with any problems, questions or concerns. We can certainly see the patient much sooner if necessary      Orders Placed This Encounter  Procedures   CBC with Differential (Cancer Center Only)    Standing Status:   Standing    Number of Occurrences:   12    Expiration Date:   11/30/2024   CMP (Cancer Center only)    Standing Status:   Standing    Number of Occurrences:   12    Expiration Date:   11/30/2024   CBC with Differential (Cancer Center Only)    Standing Status:   Future    Expected Date:   12/08/2023    Expiration Date:   12/07/2024   CMP (Cancer Center only)    Standing Status:   Future    Expected Date:   12/08/2023    Expiration Date:   12/07/2024   T4    Standing Status:   Future     Expected Date:   12/08/2023    Expiration Date:   12/07/2024   TSH    Standing Status:   Future    Expected Date:   12/08/2023    Expiration Date:   12/07/2024   Total Protein, Urine dipstick    Standing Status:   Future    Expected Date:   12/08/2023    Expiration Date:   12/07/2024   CBC with Differential (Cancer Center Only)    Standing Status:   Future    Expected Date:   12/29/2023    Expiration Date:   12/28/2024   CMP (Cancer Center only)    Standing Status:   Future    Expected Date:   12/29/2023    Expiration Date:   12/28/2024   CBC with Differential (Cancer Center Only)    Standing Status:   Future    Expected Date:   01/19/2024    Expiration Date:   01/18/2025   CMP (Cancer Center only)    Standing Status:   Future    Expected Date:   01/19/2024    Expiration Date:   01/18/2025   T4    Standing Status:   Future    Expected Date:   01/19/2024    Expiration Date:   01/18/2025   TSH    Standing Status:  Future    Expected Date:   01/19/2024    Expiration Date:   01/18/2025   Total Protein, Urine dipstick    Standing Status:   Future    Expected Date:   01/19/2024    Expiration Date:   01/18/2025   CBC with Differential (Cancer Center Only)    Standing Status:   Future    Expected Date:   02/09/2024    Expiration Date:   02/08/2025   CMP (Cancer Center only)    Standing Status:   Future    Expected Date:   02/09/2024    Expiration Date:   02/08/2025   CBC with Differential (Cancer Center Only)    Standing Status:   Future    Expected Date:   03/01/2024    Expiration Date:   03/01/2025   CMP (Cancer Center only)    Standing Status:   Future    Expected Date:   03/01/2024    Expiration Date:   03/01/2025   CBC with Differential (Cancer Center Only)    Standing Status:   Future    Expected Date:   03/22/2024    Expiration Date:   03/22/2025   CMP (Cancer Center only)    Standing Status:   Future    Expected Date:   03/22/2024    Expiration Date:   03/22/2025    T4    Standing Status:   Future    Expected Date:   03/22/2024    Expiration Date:   03/22/2025   TSH    Standing Status:   Future    Expected Date:   03/22/2024    Expiration Date:   03/22/2025   Total Protein, Urine dipstick    Standing Status:   Future    Expected Date:   03/22/2024    Expiration Date:   03/22/2025   Ambulatory referral to Radiation Oncology    Referral Priority:   Routine    Referral Type:   Consultation    Referral Reason:   Specialty Services Required    Requested Specialty:   Radiation Oncology    Number of Visits Requested:   1      Alyviah Crandle L Turquoise Esch, PA-C 12/01/23  ADDENDUM: Hematology/Oncology Attending: I had a face-to-face encounter with the patient today.  I reviewed his records, lab, scan and recommended his care plan and placed the order for the new care plan.  This is a very pleasant 68 years old African-American male with stage IV non-small cell lung cancer, squamous cell carcinoma diagnosed in April 2024 with no actionable mutation and PD-L1 expression of 70%.  Status post several treatment including palliative radiotherapy to T8 and L4 metastatic bone disease as well as palliative radiotherapy to left hilar mass.  The patient started initially systemic therapy with carboplatin , paclitaxel  and Libtayo  (Cempilimab) every 3 weeks for 4 cycles followed by maintenance treatment with single agent Libtayo  (Cempilimab) every 3 weeks status post 20 cycles.  He has been tolerating his treatment well but recently he noticed a subcutaneous nodule in the left buttock area that has been increasing in size.  He had repeat CT scan of the chest, abdomen and pelvis performed recently.  I personally and independently reviewed the scan images and discussed the result with the patient today.  Unfortunately his scan showed clear evidence for disease progression within the chest with new subcarinal adenopathy and progressive left-sided endobronchial compression and new  central left lower lobe consolidation.  He has relatively similar osseous  metastasis but there was progressive intramuscular metastasis including the dominant left paraspinous mass. I had a lengthy discussion with the patient today about his current condition and treatment options.  I explained to the patient that his current treatment maintenance Libtayo  (Cempilimab) is not effective and we may have to consider a different treatment option.  I discussed with the patient's option of palliative care versus palliative systemic second line chemotherapy with docetaxel 75 MGs/M2 and ramucirumab 10 mg/KG every 3 weeks with Neulasta  support.  The patient is interested in treatment and he is expected to start the first cycle of this treatment next week. I discussed with him the adverse effect of the chemotherapy including but not limited to alopecia, exacerbation, nausea and vomiting, peripheral neuropathy, liver or renal dysfunction as well as the adverse effect of ramucirumab including bleeding disorders, hypertension and proteinuria. He will come back for follow-up visit in 2 weeks for evaluation and management of any adverse effect of his treatment. The patient was advised to call immediately if he has any other concerning symptoms in the interval. Disclaimer: This note was dictated with voice recognition software. Similar sounding words can inadvertently be transcribed and may be missed upon review. Albert Hardy MARLA Sherrod, MD

## 2023-12-01 ENCOUNTER — Inpatient Hospital Stay

## 2023-12-01 ENCOUNTER — Inpatient Hospital Stay (HOSPITAL_BASED_OUTPATIENT_CLINIC_OR_DEPARTMENT_OTHER): Admitting: Physician Assistant

## 2023-12-01 ENCOUNTER — Other Ambulatory Visit: Payer: Self-pay | Admitting: Family Medicine

## 2023-12-01 ENCOUNTER — Inpatient Hospital Stay: Admitting: Dietician

## 2023-12-01 ENCOUNTER — Inpatient Hospital Stay: Admitting: Nurse Practitioner

## 2023-12-01 VITALS — BP 164/85 | HR 101 | Temp 99.6°F | Resp 20 | Wt 116.4 lb

## 2023-12-01 DIAGNOSIS — C3492 Malignant neoplasm of unspecified part of left bronchus or lung: Secondary | ICD-10-CM

## 2023-12-01 DIAGNOSIS — E785 Hyperlipidemia, unspecified: Secondary | ICD-10-CM

## 2023-12-01 DIAGNOSIS — Z7189 Other specified counseling: Secondary | ICD-10-CM | POA: Diagnosis not present

## 2023-12-01 DIAGNOSIS — J189 Pneumonia, unspecified organism: Secondary | ICD-10-CM

## 2023-12-01 DIAGNOSIS — Z5112 Encounter for antineoplastic immunotherapy: Secondary | ICD-10-CM | POA: Diagnosis not present

## 2023-12-01 LAB — CBC WITH DIFFERENTIAL (CANCER CENTER ONLY)
Abs Immature Granulocytes: 0.02 K/uL (ref 0.00–0.07)
Basophils Absolute: 0 K/uL (ref 0.0–0.1)
Basophils Relative: 0 %
Eosinophils Absolute: 0 K/uL (ref 0.0–0.5)
Eosinophils Relative: 0 %
HCT: 33.7 % — ABNORMAL LOW (ref 39.0–52.0)
Hemoglobin: 11.4 g/dL — ABNORMAL LOW (ref 13.0–17.0)
Immature Granulocytes: 0 %
Lymphocytes Relative: 6 %
Lymphs Abs: 0.4 K/uL — ABNORMAL LOW (ref 0.7–4.0)
MCH: 29.3 pg (ref 26.0–34.0)
MCHC: 33.8 g/dL (ref 30.0–36.0)
MCV: 86.6 fL (ref 80.0–100.0)
Monocytes Absolute: 0.5 K/uL (ref 0.1–1.0)
Monocytes Relative: 9 %
Neutro Abs: 5.2 K/uL (ref 1.7–7.7)
Neutrophils Relative %: 85 %
Platelet Count: 318 K/uL (ref 150–400)
RBC: 3.89 MIL/uL — ABNORMAL LOW (ref 4.22–5.81)
RDW: 13.8 % (ref 11.5–15.5)
WBC Count: 6.1 K/uL (ref 4.0–10.5)
nRBC: 0 % (ref 0.0–0.2)

## 2023-12-01 LAB — CMP (CANCER CENTER ONLY)
ALT: 21 U/L (ref 0–44)
AST: 17 U/L (ref 15–41)
Albumin: 3.7 g/dL (ref 3.5–5.0)
Alkaline Phosphatase: 83 U/L (ref 38–126)
Anion gap: 8 (ref 5–15)
BUN: 17 mg/dL (ref 8–23)
CO2: 28 mmol/L (ref 22–32)
Calcium: 9.6 mg/dL (ref 8.9–10.3)
Chloride: 94 mmol/L — ABNORMAL LOW (ref 98–111)
Creatinine: 0.93 mg/dL (ref 0.61–1.24)
GFR, Estimated: >60 mL/min (ref >60)
Glucose, Bld: 130 mg/dL — ABNORMAL HIGH (ref 70–99)
Potassium: 4.4 mmol/L (ref 3.5–5.1)
Sodium: 130 mmol/L — ABNORMAL LOW (ref 135–145)
Total Bilirubin: 0.3 mg/dL (ref 0.0–1.2)
Total Protein: 7.6 g/dL (ref 6.5–8.1)

## 2023-12-01 LAB — TSH: TSH: 1.29 u[IU]/mL (ref 0.350–4.500)

## 2023-12-01 MED ORDER — DEXAMETHASONE 4 MG PO TABS: ORAL_TABLET | ORAL | 2 refills | Status: DC

## 2023-12-01 MED ORDER — AZITHROMYCIN 250 MG PO TABS: ORAL_TABLET | ORAL | 0 refills | Status: DC

## 2023-12-01 MED ORDER — ONDANSETRON HCL 8 MG PO TABS: 8.0000 mg | ORAL_TABLET | Freq: Three times a day (TID) | ORAL | 1 refills | Status: AC | PRN

## 2023-12-01 MED ORDER — DEXAMETHASONE 4 MG PO TABS: ORAL_TABLET | ORAL | 1 refills | Status: AC

## 2023-12-01 MED ORDER — PROCHLORPERAZINE MALEATE 10 MG PO TABS: 10.0000 mg | ORAL_TABLET | Freq: Four times a day (QID) | ORAL | 1 refills | Status: AC | PRN

## 2023-12-01 MED ORDER — PROCHLORPERAZINE MALEATE 10 MG PO TABS: 10.0000 mg | ORAL_TABLET | Freq: Four times a day (QID) | ORAL | 2 refills | Status: DC | PRN

## 2023-12-01 NOTE — Progress Notes (Signed)
 DISCONTINUE OFF PATHWAY REGIMEN - Non-Small Cell Lung   [id number]:Cemiplimab  350 mg IV D1 + Carboplatin  AUC=6 IV D1 + Paclitaxel  200 mg/m2 IV D1 q21 Days x 4 Cycles:   A cycle is every 21 days:     Paclitaxel       Carboplatin       Cemiplimab -rwlc   **Always confirm dose/schedule in your pharmacy ordering system**  PRIOR TREATMENT: Off Pathway: Cemiplimab  350 mg IV D1 + Carboplatin  AUC=6 IV D1 + Paclitaxel  200 mg/m2 IV D1 q21 Days x 4 Cycles  START ON PATHWAY REGIMEN - Non-Small Cell Lung     A cycle is every 21 days:     Ramucirumab      Docetaxel   **Always confirm dose/schedule in your pharmacy ordering system**  Patient Characteristics: Stage IV Metastatic, Squamous, Molecular Analysis Completed, Molecular Alteration Present and Targeted Therapy Exhausted, OR EGFR/KRAS/HER2/NRG1 Present and Standard Chemotherapy/Immunotherapy Planned, OR No Alteration Present, PS = 0, 1, Second Line -  Chemotherapy/Immunotherapy, Prior PD-1/PD-L1 Inhibitor + Chemotherapy or No Prior PD-1/PD-L1 Inhibitor, and Not a Candidate for Immunotherapy Therapeutic Status: Stage IV Metastatic Histology: Squamous Cell Molecular Analysis Results: No Alteration Present ECOG Performance Status: 1 Chemotherapy/Immunotherapy Line of Therapy: Second Line Chemotherapy/Immunotherapy Immunotherapy Candidate Status: Not a Candidate for Immunotherapy Prior Immunotherapy Status: Prior PD-1/PD-L1 Inhibitor + Chemotherapy Intent of Therapy: Non-Curative / Palliative Intent, Discussed with Patient

## 2023-12-01 NOTE — Patient Instructions (Addendum)
-  We covered a lot of important information at your appointment today regarding what the treatment plan is moving forward. Here are the the main points that were discussed at your office visit with us  today:  -The treatment that you will receive consists of two chemotherapy drugs, called Docetaxel and Cyramza (Ramucirumab) -We are planning on starting your treatment next week on 12/08/23  -Your treatment will be given once every 3 weeks. We will check your labs once a week just to make sure that important components of your blood are in an acceptable range -You will receive an injection about 3 days after chemotherapy. The purpose of this injection is to boost your bodies infection fighting cells to protect you from getting an infection.  -We will get a CT scan after 3 treatments to check on the progress of treatment  Medications:  -I have sent a few important medication prescriptions to your pharmacy.  -Compazine  was sent to your pharmacy. This medication is for nausea. You may take this every 6 hours as needed if you feel nausous.  -I have sent a prescription for Decadron  (Dexamethasone ) to your pharmacy. This medication is a steroids. The purpose of this medication is because it is one if the pre-medications that is needed before you get treatment. You will need to take Two Tablets Twice a day (for a total of 4 tablets a day) the day before, the day of, and the day after chemotherapy. This means that you will take 12 tablets total over those 3 consecutive days.   Molecular Studies   Imaging Studies:   Referrals:   Side effects: The side effects of treatment may include but are not limited to alopecia (losing your hair), myelosuppression (dropping your blood counts), nausea and vomiting, peripheral neuropathy (numbness in the hands and feet), liver or renal dysfunction as well as the adverse effect of Cyramza including increased risk for hemorrhage (bleeding) and GI perforation. Please seek  medical attention if you develop concerning or significant bleeding.   Follow up:  -We will see you back for a follow up visit in __ weeks.

## 2023-12-02 ENCOUNTER — Encounter: Payer: Self-pay | Admitting: Nurse Practitioner

## 2023-12-02 LAB — T4: T4, Total: 10 ug/dL (ref 4.5–12.0)

## 2023-12-03 ENCOUNTER — Other Ambulatory Visit: Payer: Self-pay | Admitting: Nurse Practitioner

## 2023-12-03 ENCOUNTER — Other Ambulatory Visit: Payer: Self-pay | Admitting: Family Medicine

## 2023-12-03 DIAGNOSIS — Z515 Encounter for palliative care: Secondary | ICD-10-CM

## 2023-12-03 DIAGNOSIS — C3492 Malignant neoplasm of unspecified part of left bronchus or lung: Secondary | ICD-10-CM

## 2023-12-03 DIAGNOSIS — G893 Neoplasm related pain (acute) (chronic): Secondary | ICD-10-CM

## 2023-12-03 DIAGNOSIS — K219 Gastro-esophageal reflux disease without esophagitis: Secondary | ICD-10-CM

## 2023-12-03 MED ORDER — OXYCODONE HCL 5 MG PO TABS: 5.0000 mg | ORAL_TABLET | ORAL | 0 refills | Status: DC | PRN

## 2023-12-03 MED ORDER — MORPHINE SULFATE ER 15 MG PO TBCR: 15.0000 mg | EXTENDED_RELEASE_TABLET | Freq: Three times a day (TID) | ORAL | 0 refills | Status: DC

## 2023-12-06 ENCOUNTER — Ambulatory Visit: Admitting: Family Medicine

## 2023-12-06 ENCOUNTER — Telehealth (INDEPENDENT_AMBULATORY_CARE_PROVIDER_SITE_OTHER): Payer: Self-pay

## 2023-12-06 ENCOUNTER — Encounter: Payer: Self-pay | Admitting: Family Medicine

## 2023-12-06 VITALS — BP 100/52 | HR 65 | Temp 98.2°F | Ht 69.0 in | Wt 120.0 lb

## 2023-12-06 DIAGNOSIS — E785 Hyperlipidemia, unspecified: Secondary | ICD-10-CM

## 2023-12-06 DIAGNOSIS — I10 Essential (primary) hypertension: Secondary | ICD-10-CM | POA: Diagnosis not present

## 2023-12-06 DIAGNOSIS — R739 Hyperglycemia, unspecified: Secondary | ICD-10-CM

## 2023-12-06 DIAGNOSIS — C3492 Malignant neoplasm of unspecified part of left bronchus or lung: Secondary | ICD-10-CM

## 2023-12-06 DIAGNOSIS — K219 Gastro-esophageal reflux disease without esophagitis: Secondary | ICD-10-CM

## 2023-12-06 LAB — POCT GLYCOSYLATED HEMOGLOBIN (HGB A1C)
HbA1c POC (<> result, manual entry): 6.1 % (ref 4.0–5.6)
HbA1c, POC (controlled diabetic range): 6.1 % (ref 0.0–7.0)
HbA1c, POC (prediabetic range): 6.1 % (ref 5.7–6.4)
Hemoglobin A1C: 6.1 % — AB (ref 4.0–5.6)

## 2023-12-06 MED ORDER — OMEPRAZOLE 20 MG PO CPDR: 20.0000 mg | DELAYED_RELEASE_CAPSULE | Freq: Every day | ORAL | 1 refills | Status: AC

## 2023-12-06 MED ORDER — ROSUVASTATIN CALCIUM 5 MG PO TABS: 5.0000 mg | ORAL_TABLET | Freq: Every day | ORAL | 1 refills | Status: AC

## 2023-12-06 MED ORDER — LISINOPRIL-HYDROCHLOROTHIAZIDE 10-12.5 MG PO TABS: 1.0000 | ORAL_TABLET | Freq: Every day | ORAL | 1 refills | Status: DC

## 2023-12-06 MED ORDER — EZETIMIBE 10 MG PO TABS: 10.0000 mg | ORAL_TABLET | Freq: Every day | ORAL | 1 refills | Status: AC

## 2023-12-06 MED ORDER — LISINOPRIL 10 MG PO TABS: 10.0000 mg | ORAL_TABLET | Freq: Every day | ORAL | 3 refills | Status: AC

## 2023-12-06 NOTE — Patient Instructions (Signed)
 Thank you for coming in to see me today.  I think it would be reasonable to decrease your medication again for blood pressure.  I switched your combination pill to just lisinopril  10 mg a day.  Keep an eye on blood pressure readings and if we need to adjust medication either up or down, let me know.  Your oncology team can also monitor your blood pressures and can either adjust medications or I am happy to be involved in that decision if needed.  Hang in there with the upcoming chemotherapy.  Please let me know if there is anything I can do. You will be in my thoughts and prayers.

## 2023-12-06 NOTE — Progress Notes (Unsigned)
 Subjective:  Patient ID: Albert Nida Raddle., male    DOB: 03-21-1955  Age: 68 y.o. MRN: [id number]  CC:  Chief Complaint  Patient presents with   Medical Management of Chronic Issues    6 month follow up    HPI Albert Nida Raddle. presents for    Hypertension: With prior history of hypotension, medications were adjusted then elevated readings with further adjustment.  Lisinopril  10 mg daily, hydrochlorothiazide  12.5 mg daily at April visit.  Some opportunity for increased fluids, but will be starting chemo.  Frequent urination - longstanding.  Home readings: BP Readings from Last 3 Encounters:  12/06/23 (!) 100/52  12/01/23 (!) 164/85  11/10/23 116/68   Lab Results  Component Value Date   CREATININE 0.93 12/01/2023   GERD: Intermittent breakthrough heartburn with omeprazole . Pepcid  less than once per week helps.   Hyperlipidemia: Tolerating Zetia  10 mg, Crestor  5 mg daily. No new side effects.  Lab Results  Component Value Date   CHOL 172 05/19/2023   HDL 62.10 05/19/2023   LDLCALC 98 05/19/2023   TRIG 60.0 05/19/2023   CHOLHDL 3 05/19/2023   Lab Results  Component Value Date   ALT 21 12/01/2023   AST 17 12/01/2023   ALKPHOS 83 12/01/2023   BILITOT 0.3 12/01/2023   Prediabetes: Continued monitoring of mild hyperglycemia, prediabetes range A1c.  Dietary adjustments as above.    Plans to discuss flu and covid vaccines with oncology tomorrow.   Lab Results  Component Value Date   HGBA1C 6.1 05/19/2023   Wt Readings from Last 3 Encounters:  12/06/23 120 lb (54.4 kg)  12/01/23 116 lb 6.4 oz (52.8 kg)  11/10/23 119 lb 14.4 oz (54.4 kg)  Glucose 130 on 10/22.   Non-small cell lung cancer Followed by oncology with metastatic lung cancer.  Appointment with Dr. Gatha October 1.  Prior palliative radiotherapy, systemic chemotherapy and maintenance treatment with Libtayo .  Palliative radiotherapy to the left hilar mass in July.  CT chest/abdomen/pelvis on 11/23/2023.   Mild disease progression within the chest.  New central left lower lobe consolidation likely related to postobstructive pneumonitis/pneumonia.  Relatively similar osseous metastases.  Progressive intramuscular metastasis including dominant left paraspinous mass but no intra-abdominal or pelvic metastases.  Was a subcutaneous metastasis of the left gluteals 1.6 x 2.3 cm enhancing nodule in the subcutaneous fat superficial to the right gluteals.  He is followed by palliative care.  Visit October 1 regarding appetite fluctuations.  Daily stool softener for constipation.  Pain management with MS Contin , oxycodone , Tylenol . Follow-up appointment with oncology 5 days ago, recommended docetaxel and Cyramza, first cycle planned on 10/29.   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   Bilateral sensorineural hearing loss 12/14/2018   Bilateral impacted cerumen 10/13/2018   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   Atherosclerosis of native artery of extremity with intermittent claudication 09/08/2013   Former moderate cigarette smoker (10-19 per day) 05/20/2012   Peripheral vascular disease with claudication    Hyperlipidemia    Aortic insufficiency    GERD (gastroesophageal reflux  disease)    Hypertension    Past Medical History:  Diagnosis Date   Aortic regurgitation    Aortic stenosis 04/04/2018   Atherosclerosis of native arteries of the extremities with intermittent claudication 09/08/2013   Atherosclerosis of native artery of extremity with intermittent  claudication 09/08/2013   IMO SNOMED Dx Update Oct 2024     Bilateral impacted cerumen 10/13/2018   Bilateral sensorineural hearing loss 12/14/2018   Cancer (HCC)    lung   Dyspnea    Encounter for antineoplastic chemotherapy 08/18/2022   Encounter for antineoplastic immunotherapy 08/18/2022   Former moderate cigarette smoker (10-19 per day) 05/20/2012   Quit smoking Nov 2013 when hospitalized w/ HTN and chest pain(diagnosed w/ valvular heart disease)   GERD (gastroesophageal reflux disease)    Heart murmur    History of radiation therapy    Lumbar Spine, Thoracic Spine- 06/25/22-07/10/22- Dr. Lynwood Nasuti   History of radiation therapy    Left lung-07/29/23-08/11/23- Dr. 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   Substance abuse (HCC)    Past Surgical History:  Procedure Laterality Date   BRONCHIAL BIOPSY  05/26/2022   Procedure: BRONCHIAL BIOPSIES;  Surgeon: Brenna Adine CROME, DO;  Location: [facility];  Service: Pulmonary;;   BRONCHIAL BRUSHINGS  05/26/2022   Procedure: BRONCHIAL BRUSHINGS;  Surgeon: Brenna Adine CROME, DO;  Location: [facility];  Service: Pulmonary;;   NO PAST SURGERIES     VIDEO BRONCHOSCOPY  05/26/2022   Procedure: VIDEO BRONCHOSCOPY WITHOUT FLUORO;  Surgeon: Brenna Adine CROME, DO;  Location: [facility];  Service: Pulmonary;;   Allergies  Allergen Reactions   Lipitor [Atorvastatin]     myalgia   Pravastatin Other (See Comments)    myalgia   Prior to Admission medications   Medication Sig Start Date End Date Taking? Authorizing Provider  albuterol  (VENTOLIN  HFA) 108 (90 Base) MCG/ACT inhaler Inhale 2 puffs into the lungs every 6 (six) hours as needed for wheezing or shortness of breath. 05/19/23  Yes [name], MD  Ascorbic Acid (VITAMIN C PO) Take 1 tablet by mouth daily.   Yes [provider]  azithromycin (ZITHROMAX Z-PAK) 250 MG tablet Take as directed 12/01/23  Yes Heilingoetter, Cassandra L, PA-C  benzonatate  (TESSALON ) 100 MG capsule Take 1 capsule (100 mg total) by mouth 3 (three) times daily as needed. 08/17/23  Yes Heilingoetter, Cassandra L, PA-C  Cholecalciferol (VITAMIN D3) 1000 UNITS CAPS Take 1,000 Units by mouth daily.   Yes [provider]  Coenzyme Q10 (CO Q 10 PO) Take 1 tablet by mouth daily.   Yes [provider]  Cyanocobalamin (VITAMIN B-12 PO) Take 1 tablet by mouth daily.   Yes [provider]  cyclobenzaprine (FLEXERIL) 5 MG tablet Take 1 tablet (5 mg total) by mouth 3 (three) times daily as needed for muscle spasms. 06/18/22  Yes Pickenpack-Cousar, Fannie SAILOR, NP  dexamethasone  (DECADRON ) 4 MG tablet Take 2 tablets TWICE a day the day before, the day of, and the day after chemotherapy 12/01/23  Yes Heilingoetter, Cassandra L, PA-C  dexamethasone  (DECADRON ) 4 MG tablet Take 2 tabs by mouth 2 times daily starting day before chemo. Then take 2 tabs daily for 2 days starting day  after chemo. Take with food. 12/01/23  Yes Sherrod Sherrod, MD  ELIQUIS  5 MG TABS tablet Take 1 tablet by mouth twice daily 10/20/23  Yes Sherrod Sherrod, MD  ezetimibe  (ZETIA ) 10 MG tablet Take 1 tablet by mouth once daily 12/01/23  Yes [name], MD  fluticasone  (FLONASE ) 50 MCG/ACT nasal spray Place 1-2 sprays into both nostrils daily. 04/24/20  Yes [name], MD  gabapentin  (NEURONTIN ) 300 MG capsule Take 1 capsule by mouth twice daily 11/14/23  Yes Pickenpack-Cousar, Athena N, NP  KRILL OIL PO Take 1 tablet by mouth daily.   Yes [provider]  lisinopril -hydrochlorothiazide  (ZESTORETIC ) 10-12.5 MG tablet Take 1 tablet by mouth once daily 10/04/23  Yes [name], MD  MAGNESIUM PO Take 1 tablet by mouth daily.   Yes [provider]  methylPREDNISolone  (MEDROL  DOSEPAK) 4 MG TBPK tablet Take with signs of chronic sinusitis and take as directed 10/06/23  Yes Soldatova, Liuba, MD  metoCLOPramide  (REGLAN ) 10 MG tablet Take 1 tablet (10 mg total) by mouth 4 (four) times daily -  before meals and at bedtime. 07/20/22  Yes Pickenpack-Cousar, Fannie SAILOR, NP  metoprolol  tartrate (LOPRESSOR ) 25 MG tablet Take 1 tablet (25 mg total) by mouth once for 1 dose. Please take this medication 2 hours before CT 01/05/23 12/06/23 Yes Munley, Redell PARAS, MD  morphine  (MS CONTIN ) 15 MG 12 hr tablet Take 1 tablet (15 mg total) by mouth every 8 (eight) hours. 12/03/23  Yes Pickenpack-Cousar, Fannie SAILOR, NP  Multiple Vitamins-Minerals (CENTRUM SILVER PO) Take 1 tablet by mouth daily.   Yes [provider]  Multiple Vitamins-Minerals (ZINC PO) Take 1 tablet by mouth daily.   Yes [provider]  Naphazoline-Pheniramine (OPCON-A) 0.027-0.315 % SOLN Place 1 drop into both eyes daily as needed (redness).   Yes [provider]  nitroGLYCERIN  (NITROSTAT ) 0.4 MG SL tablet Place 1 tablet (0.4 mg total) under the tongue every 5 (five) minutes as needed for chest pain. 10/05/22  Yes Monetta Redell PARAS, MD  omeprazole  (PRILOSEC) 20 MG capsule Take 1 capsule (20 mg total) by mouth daily. 07/07/23  Yes [name], MD  omeprazole  (PRILOSEC) 20 MG capsule Take 1 capsule by mouth once daily 12/03/23  Yes [name], MD  ondansetron  (ZOFRAN ) 8 MG tablet Take 1 tablet (8 mg total) by mouth every 8 (eight) hours as needed for nausea or vomiting. 12/01/23  Yes Sherrod Sherrod, MD  oxyCODONE  (OXY IR/ROXICODONE ) 5 MG immediate release tablet Take 1 tablet (5 mg total) by mouth every 4 (four) hours as needed for severe pain (pain score 7-10) or breakthrough pain. 12/03/23  Yes Pickenpack-Cousar, Fannie SAILOR, NP  potassium chloride  SA (KLOR-CON  M) 20 MEQ tablet Take 1 tablet (20 mEq total) by mouth daily. 12/22/22  Yes Heilingoetter,  Cassandra L, PA-C  prochlorperazine  (COMPAZINE ) 10 MG tablet Take 1 tablet (10 mg total) by mouth every 6 (six) hours as needed for nausea or vomiting. 06/09/22  Yes Sherrod Sherrod, MD  prochlorperazine  (COMPAZINE ) 10 MG tablet Take 1 tablet (10 mg total) by mouth every 6 (six) hours as needed. 12/01/23  Yes Heilingoetter, Cassandra L, PA-C  prochlorperazine  (COMPAZINE ) 10 MG tablet Take 1 tablet (10 mg total) by mouth every 6 (six) hours as needed for nausea or vomiting. 12/01/23  Yes Sherrod Sherrod, MD  rosuvastatin  (CRESTOR ) 5 MG tablet TAKE 1 TABLET BY MOUTH AT BEDTIME 12/01/23  Yes [name], MD  sucralfate  (CARAFATE ) 1 g tablet  Take 1 tablet (1 g total) by mouth 4 (four) times daily -  with meals and at bedtime. Crush and dissolve in 10 mL's of warm water prior to swallowing, take 20 min prior to meals 08/03/23  Yes Shannon Agent, MD  triamcinolone  0.1%-Eucerin equivalent 1:1 cream mixture Apply topically 3 (three) times daily as needed. 04/06/23  Yes Pickenpack-Cousar, Fannie SAILOR, NP  TURMERIC PO Take 1 tablet by mouth 3 (three) times a week.   Yes [provider]  umeclidinium-vilanterol (ANORO ELLIPTA ) 62.5-25 MCG/ACT AEPB Inhale 1 puff into the lungs daily. 09/16/22  Yes [name], MD  levofloxacin  (LEVAQUIN ) 500 MG tablet Take 1 tablet (500 mg total) by mouth daily. Patient not taking: Reported on 12/06/2023 01/12/23   Hanford Powell BRAVO, NP   Social History   Socioeconomic History   Marital status: Married    Spouse name: Zebedee   Number of children: Not on file   Years of education: Not on file   Highest education level: Associate degree: academic program  Occupational History   Occupation: Recruitment Consultant  Tobacco Use   Smoking status: Former    Current packs/day: 0.00    Average packs/day: 1 pack/day for 30.0 years (30.0 ttl pk-yrs)    Types: Cigarettes    Start date: 12/20/1981    Quit date: 12/21/2011    Years since quitting: 11.9   Smokeless  tobacco: Never  Vaping Use   Vaping status: Every Day  Substance and Sexual Activity   Alcohol use: Yes    Comment: Ocassionaly.    Drug use: Yes    Frequency: 1.0 times per week    Types: Marijuana   Sexual activity: Yes    Birth control/protection: None  Other Topics Concern   Not on file  Social History Narrative   Married. Education: [employer]. Exercise:  Somewhat.   Social Drivers of Corporate Investment Banker Strain: Low Risk  (12/03/2023)   Overall Financial Resource Strain (CARDIA)    Difficulty of Paying Living Expenses: Not hard at all  Food Insecurity: No Food Insecurity (12/03/2023)   Hunger Vital Sign    Worried About Running Out of Food in the Last Year: Never true    Ran Out of Food in the Last Year: Never true  Transportation Needs: No Transportation Needs (12/03/2023)   PRAPARE - Administrator, Civil Service (Medical): No    Lack of Transportation (Non-Medical): No  Physical Activity: Inactive (12/03/2023)   Exercise Vital Sign    Days of Exercise per Week: 1 day    Minutes of Exercise per Session: 0 min  Stress: No Stress Concern Present (12/03/2023)   [employer] of Occupational Health - Occupational Stress Questionnaire    Feeling of Stress: Not at all  Social Connections: Moderately Isolated (12/03/2023)   Social Connection and Isolation Panel    Frequency of Communication with Friends and Family: Once a week    Frequency of Social Gatherings with Friends and Family: Once a week    Attends Religious Services: More than 4 times per year    Active Member of [employer] or Organizations: No    Attends Banker Meetings: Not on file    Marital Status: Married  Intimate Partner Violence: Not At Risk (07/22/2023)   Humiliation, Afraid, Rape, and Kick questionnaire    Fear of Current or Ex-Partner: No    Emotionally Abused: No    Physically Abused: No    Sexually Abused: No  Review of Systems   Objective:   Vitals:    12/06/23 1320  BP: (!) 100/52  Pulse: 65  Temp: 98.2 F (36.8 C)  TempSrc: Oral  SpO2: 93%  Weight: 120 lb (54.4 kg)  Height: 5' 9 (1.753 m)     Physical Exam Vitals reviewed.  Constitutional:      Appearance: He is well-developed.  HENT:     Head: Normocephalic and atraumatic.  Neck:     Vascular: No carotid bruit or JVD.  Cardiovascular:     Rate and Rhythm: Normal rate and regular rhythm.     Heart sounds: Normal heart sounds. No murmur heard. Pulmonary:     Effort: Pulmonary effort is normal.     Breath sounds: Rhonchi present. No rales.  Musculoskeletal:     Right lower leg: No edema.     Left lower leg: No edema.  Skin:    General: Skin is warm and dry.  Neurological:     Mental Status: He is alert and oriented to person, place, and time.  Psychiatric:        Mood and Affect: Mood normal.        Assessment & Plan:  Albert Moore. is a 68 y.o. male . Hyperlipidemia, unspecified hyperlipidemia type  Essential hypertension - Plan: lisinopril  (ZESTRIL ) 10 MG tablet  Hyperglycemia - Plan: POCT glycosylated hemoglobin (Hb A1C)  Squamous cell carcinoma of lung, stage IV, left (HCC)  Gastroesophageal reflux disease, unspecified whether esophagitis present   Meds ordered this encounter  Medications   lisinopril  (ZESTRIL ) 10 MG tablet    Sig: Take 1 tablet (10 mg total) by mouth daily.    Dispense:  90 tablet    Refill:  3   Patient Instructions  Thank you for coming in to see me today.  I think it would be reasonable to decrease your medication again for blood pressure.  I switched your combination pill to just lisinopril  10 mg a day.  Keep an eye on blood pressure readings and if we need to adjust medication either up or down, let me know.  Your oncology team can also monitor your blood pressures and can either adjust medications or I am happy to be involved in that decision if needed.  Hang in there with the upcoming chemotherapy.  Please let me know if  there is anything I can do. You will be in my thoughts and prayers.     Signed,   Albert Pines, MD [facility], [facility] 12/06/23 2:14 PM

## 2023-12-06 NOTE — Telephone Encounter (Signed)
 Left vm to r/s 04/27/24 appointment

## 2023-12-07 ENCOUNTER — Inpatient Hospital Stay

## 2023-12-07 ENCOUNTER — Ambulatory Visit: Payer: Self-pay | Admitting: Family Medicine

## 2023-12-07 VITALS — BP 135/65 | HR 64 | Temp 98.4°F | Resp 17

## 2023-12-07 DIAGNOSIS — Z5112 Encounter for antineoplastic immunotherapy: Secondary | ICD-10-CM | POA: Diagnosis not present

## 2023-12-07 DIAGNOSIS — C3492 Malignant neoplasm of unspecified part of left bronchus or lung: Secondary | ICD-10-CM

## 2023-12-07 LAB — CMP (CANCER CENTER ONLY)
ALT: 26 U/L (ref 0–44)
AST: 21 U/L (ref 15–41)
Albumin: 3.4 g/dL — ABNORMAL LOW (ref 3.5–5.0)
Alkaline Phosphatase: 59 U/L (ref 38–126)
Anion gap: 9 (ref 5–15)
BUN: 25 mg/dL — ABNORMAL HIGH (ref 8–23)
CO2: 27 mmol/L (ref 22–32)
Calcium: 9.1 mg/dL (ref 8.9–10.3)
Chloride: 99 mmol/L (ref 98–111)
Creatinine: 0.88 mg/dL (ref 0.61–1.24)
GFR, Estimated: >60 mL/min (ref >60)
Glucose, Bld: 118 mg/dL — ABNORMAL HIGH (ref 70–99)
Potassium: 4.2 mmol/L (ref 3.5–5.1)
Sodium: 135 mmol/L (ref 135–145)
Total Bilirubin: 0.2 mg/dL (ref 0.0–1.2)
Total Protein: 7.1 g/dL (ref 6.5–8.1)

## 2023-12-07 LAB — CBC WITH DIFFERENTIAL (CANCER CENTER ONLY)
Abs Immature Granulocytes: 0.03 K/uL (ref 0.00–0.07)
Basophils Absolute: 0 K/uL (ref 0.0–0.1)
Basophils Relative: 0 %
Eosinophils Absolute: 0 K/uL (ref 0.0–0.5)
Eosinophils Relative: 0 %
HCT: 31 % — ABNORMAL LOW (ref 39.0–52.0)
Hemoglobin: 10.2 g/dL — ABNORMAL LOW (ref 13.0–17.0)
Immature Granulocytes: 0 %
Lymphocytes Relative: 4 %
Lymphs Abs: 0.4 K/uL — ABNORMAL LOW (ref 0.7–4.0)
MCH: 29 pg (ref 26.0–34.0)
MCHC: 32.9 g/dL (ref 30.0–36.0)
MCV: 88.1 fL (ref 80.0–100.0)
Monocytes Absolute: 0.5 K/uL (ref 0.1–1.0)
Monocytes Relative: 6 %
Neutro Abs: 7.9 K/uL — ABNORMAL HIGH (ref 1.7–7.7)
Neutrophils Relative %: 90 %
Platelet Count: 281 K/uL (ref 150–400)
RBC: 3.52 MIL/uL — ABNORMAL LOW (ref 4.22–5.81)
RDW: 14.6 % (ref 11.5–15.5)
WBC Count: 8.8 K/uL (ref 4.0–10.5)
nRBC: 0 % (ref 0.0–0.2)

## 2023-12-07 LAB — TSH: TSH: 0.516 u[IU]/mL (ref 0.350–4.500)

## 2023-12-07 LAB — TOTAL PROTEIN, URINE DIPSTICK: Protein, ur: NEGATIVE mg/dL

## 2023-12-07 MED ORDER — DIPHENHYDRAMINE HCL 50 MG/ML IJ SOLN
50.0000 mg | Freq: Once | INTRAMUSCULAR | Status: AC
  Administered 2023-12-07: 50 mg via INTRAVENOUS
  Filled 2023-12-07: qty 1

## 2023-12-07 MED ORDER — DEXAMETHASONE SOD PHOSPHATE PF 10 MG/ML IJ SOLN
10.0000 mg | Freq: Once | INTRAMUSCULAR | Status: AC
  Administered 2023-12-07: 10 mg via INTRAVENOUS

## 2023-12-07 MED ORDER — SODIUM CHLORIDE 0.9 % IV SOLN
75.0000 mg/m2 | Freq: Once | INTRAVENOUS | Status: AC
  Administered 2023-12-07: 120 mg via INTRAVENOUS
  Filled 2023-12-07: qty 12

## 2023-12-07 MED ORDER — SODIUM CHLORIDE 0.9 % IV SOLN
10.0000 mg/kg | Freq: Once | INTRAVENOUS | Status: AC
  Administered 2023-12-07: 500 mg via INTRAVENOUS
  Filled 2023-12-07: qty 50

## 2023-12-07 MED ORDER — SODIUM CHLORIDE 0.9 % IV SOLN: INTRAVENOUS | Status: DC

## 2023-12-07 MED ORDER — ACETAMINOPHEN 325 MG PO TABS
650.0000 mg | ORAL_TABLET | Freq: Once | ORAL | Status: AC
  Administered 2023-12-07: 650 mg via ORAL
  Filled 2023-12-07: qty 2

## 2023-12-07 NOTE — Patient Instructions (Signed)
 [facility] - A DEPT OF [facility]  Discharge Instructions: Thank you for choosing [facility] to provide your oncology and hematology care.   If you have a lab appointment with the Cancer Center, please go directly to the Cancer Center and check in at the registration area.   Wear comfortable clothing and clothing appropriate for easy access to any Portacath or PICC line.   We strive to give you quality time with your provider. You may need to reschedule your appointment if you arrive late (15 or more minutes).  Arriving late affects you and other patients whose appointments are after yours.  Also, if you miss three or more appointments without notifying the office, you may be dismissed from the clinic at the provider's discretion.      For prescription refill requests, have your pharmacy contact our office and allow 72 hours for refills to be completed.    Today you received the following chemotherapy and/or immunotherapy agents: DOCEtaxel (TAXOTERE), ramucirumab (CYRAMZA)      To help prevent nausea and vomiting after your treatment, we encourage you to take your nausea medication as directed.  BELOW ARE SYMPTOMS THAT SHOULD BE REPORTED IMMEDIATELY: *FEVER GREATER THAN 100.4 F (38 C) OR HIGHER *CHILLS OR SWEATING *NAUSEA AND VOMITING THAT IS NOT CONTROLLED WITH YOUR NAUSEA MEDICATION *UNUSUAL SHORTNESS OF BREATH *UNUSUAL BRUISING OR BLEEDING *URINARY PROBLEMS (pain or burning when urinating, or frequent urination) *BOWEL PROBLEMS (unusual diarrhea, constipation, pain near the anus) TENDERNESS IN MOUTH AND THROAT WITH OR WITHOUT PRESENCE OF ULCERS (sore throat, sores in mouth, or a toothache) UNUSUAL RASH, SWELLING OR PAIN  UNUSUAL VAGINAL DISCHARGE OR ITCHING   Items with * indicate a potential emergency and should be followed up as soon as possible or go to the Emergency Department if any problems should occur.  Please show the  CHEMOTHERAPY ALERT CARD or IMMUNOTHERAPY ALERT CARD at check-in to the Emergency Department and triage nurse.  Should you have questions after your visit or need to cancel or reschedule your appointment, please contact [facility] - A DEPT OF [facility]  Dept: [phone]  and follow the prompts.  Office hours are 8:00 a.m. to 4:30 p.m. Monday - Friday. Please note that voicemails left after 4:00 p.m. may not be returned until the following business day.  We are closed weekends and major holidays. You have access to a nurse at all times for urgent questions. Please call the main number to the clinic Dept: [phone] and follow the prompts.   For any non-urgent questions, you may also contact your provider using MyChart. We now offer e-Visits for anyone 50 and older to request care online for non-urgent symptoms. For details visit [email or web address].   Also download the MyChart app! Go to the app store, search MyChart, open the app, select [facility], and log in with your MyChart username and password.

## 2023-12-08 LAB — T4: T4, Total: 7.7 ug/dL (ref 4.5–12.0)

## 2023-12-09 ENCOUNTER — Inpatient Hospital Stay

## 2023-12-09 VITALS — BP 106/53 | HR 79 | Temp 98.6°F | Resp 16

## 2023-12-09 DIAGNOSIS — C3492 Malignant neoplasm of unspecified part of left bronchus or lung: Secondary | ICD-10-CM

## 2023-12-09 DIAGNOSIS — Z5112 Encounter for antineoplastic immunotherapy: Secondary | ICD-10-CM | POA: Diagnosis not present

## 2023-12-09 MED ORDER — PEGFILGRASTIM-FPGK 6 MG/0.6ML ~~LOC~~ SOSY
6.0000 mg | PREFILLED_SYRINGE | Freq: Once | SUBCUTANEOUS | Status: AC
  Administered 2023-12-09: 6 mg via SUBCUTANEOUS
  Filled 2023-12-09: qty 0.6

## 2023-12-09 NOTE — Progress Notes (Unsigned)
 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    Molecular studies by [email or web address] showed no actionable mutations and PD-L1 expression of 70%.  PRIOR THERAPY: 1) Palliative radiotherapy to the T8 and L4 metastatic bone disease. 2) Systemic chemotherapy with carboplatin  for AUC of 5, paclitaxel  175 Mg/M2 and Libtayo  (Cempilimab) 350 Mg IV every 3 weeks with Neulasta  support for 4 cycles  3) Palliative radiation to the left hilar mass under the care of Dr. Shannon. Last dose on 08/11/23.  4)  Maintenance treatment with single agent Libtayo  (Cempilimab) 350 Mg IV every 3 weeks.  First dose September 08, 2022.  Status post 20 cycles.   CURRENT THERAPY: 1) docetaxel and Cyramza IV every 3 weeks.  First as expected on 12/08/23.  2) Xgeva  every 6 weeks 3) Palliative radiation to the left paraspinal mass and right gluteal subcutaneous nodule under the care of Dr. Shannon  INTERVAL HISTORY: Albert Hardy. 68 y.o. male returns to the clinic today for a follow-up visit.  The patient was last seen in the clinic by Dr. Sherrod and myself on 12/01/2023.  Unfortunately he is found to have disease progression at his last appointment.  Therefore he was started on docetaxel and Cyramza last week.  He is status post his first cycle of treatment and tolerated it well. He did have some bone pain from neulasta . He forgot about taking the claritin.   He has experienced weight gain and continues to consume Premier protein drinks, typically once a day, occasionally twice. His shortness of breath and cough remain unchanged, with no chest discomfort or  hemoptysis.  He saw radiation oncology in the interval who is planning on performing palliative radiation to the right gluteal subcutaneous nodule in the left paraspinal mass  He is taking Senokot for constipation, which he has increased, resulting in improvement. No nausea, vomiting, diarrhea, rashes, or abnormal bleeding or bruising.  No new neuropathy is reported, though he experiences numbness and tingling in his hands and feet as before. He experienced significant achiness following a recent injection, which was particularly severe the day after administration.   He is here for a 1 week follow up toxicity check and to manage any adverse side effects of treatment.    MEDICAL HISTORY: Past Medical History:  Diagnosis Date   Aortic regurgitation    Aortic stenosis 04/04/2018   Atherosclerosis of native arteries of the extremities with intermittent claudication 09/08/2013   Atherosclerosis of native artery of extremity with intermittent claudication 09/08/2013   IMO SNOMED Dx Update Oct 2024     Bilateral impacted cerumen 10/13/2018   Bilateral sensorineural hearing loss 12/14/2018   Cancer (HCC)    lung   Dyspnea    Encounter for antineoplastic chemotherapy 08/18/2022   Encounter for antineoplastic immunotherapy 08/18/2022   Former moderate cigarette smoker (10-19 per day) 05/20/2012   Quit smoking Nov 2013 when hospitalized w/ HTN and chest pain(diagnosed w/ valvular heart disease)   GERD (gastroesophageal reflux disease)    Heart murmur    History of radiation therapy    Lumbar Spine, Thoracic Spine- 06/25/22-07/10/22- Dr. Lynwood Shannon   History of  radiation therapy    Left lung-07/29/23-08/11/23- Dr. 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  MEDICATIONS:  Current Outpatient Medications  Medication Sig Dispense Refill   benzonatate  (TESSALON ) 100 MG capsule Take 1 capsule (100 mg total) by mouth 3 (three) times daily as needed. 30 capsule 2   dexamethasone  (DECADRON ) 4 MG tablet Take 2 tablets TWICE a day the day before, the day of, and the day after chemotherapy 40 tablet 2   sucralfate  (CARAFATE ) 1 g tablet Take 1 tablet (1 g total) by mouth 4 (four) times daily -  with meals and at bedtime. Crush and dissolve in 10 mL's of warm water prior to swallowing, take 20 min prior to meals (Patient taking differently: Take 1 g by mouth as needed. Crush and dissolve in 10 mL's of warm water prior to swallowing, take 20 min prior to meals) 60 tablet 1   albuterol  (VENTOLIN  HFA) 108 (90 Base) MCG/ACT inhaler Inhale 2 puffs into the lungs every 6 (six) hours as needed for wheezing or shortness of breath. 8 g 6   Ascorbic Acid (VITAMIN C PO) Take 1 tablet by mouth daily.     azithromycin (ZITHROMAX Z-PAK) 250 MG tablet Take as directed 6 each 0   Cholecalciferol (VITAMIN D3) 1000 UNITS CAPS Take 1,000 Units by mouth daily.     Coenzyme Q10 (CO Q 10 PO) Take 1 tablet by mouth daily.     Cyanocobalamin (VITAMIN B-12 PO) Take 1 tablet by mouth daily.     cyclobenzaprine (FLEXERIL) 5 MG tablet Take 1 tablet (5 mg total) by mouth 3 (three) times daily as needed for muscle spasms. 30 tablet 0   dexamethasone  (DECADRON ) 4 MG tablet Take 2 tabs by mouth 2 times daily starting day before chemo. Then take 2 tabs daily for 2 days starting day after chemo. Take with food. 30 tablet 1   ELIQUIS  5 MG TABS tablet Take 1 tablet by mouth twice daily 60 tablet 0   ezetimibe  (ZETIA ) 10 MG tablet Take 1 tablet (10 mg total) by mouth daily. 90 tablet 1   fluticasone  (FLONASE ) 50 MCG/ACT nasal spray Place 1-2 sprays into both nostrils daily. 16 g 6    gabapentin  (NEURONTIN ) 300 MG capsule Take 1 capsule by mouth twice daily 60 capsule 0   KRILL OIL PO Take 1 tablet by mouth daily.     lisinopril  (ZESTRIL ) 10 MG tablet Take 1 tablet (10 mg total) by mouth daily. 90 tablet 3   MAGNESIUM PO Take 1 tablet by mouth daily.     methylPREDNISolone  (MEDROL  DOSEPAK) 4 MG TBPK tablet Take with signs of chronic sinusitis and take as directed 1 each 1   metoCLOPramide  (REGLAN ) 10 MG tablet Take 1 tablet (10 mg total) by mouth 4 (four) times daily -  before meals and at bedtime. 56 tablet 0   metoprolol  tartrate (LOPRESSOR ) 25 MG tablet Take 1 tablet (25 mg total) by mouth once for 1 dose. Please take this medication 2 hours before CT 1 tablet 0   morphine  (MS CONTIN ) 15 MG 12 hr tablet Take 1 tablet (  15 mg total) by mouth every 8 (eight) hours. 90 tablet 0   Multiple Vitamins-Minerals (CENTRUM SILVER PO) Take 1 tablet by mouth daily.     Multiple Vitamins-Minerals (ZINC PO) Take 1 tablet by mouth daily.     Naphazoline-Pheniramine (OPCON-A) 0.027-0.315 % SOLN Place 1 drop into both eyes daily as needed (redness).     nitroGLYCERIN  (NITROSTAT ) 0.4 MG SL tablet Place 1 tablet (0.4 mg total) under the tongue every 5 (five) minutes as needed for chest pain. 25 tablet 1   omeprazole  (PRILOSEC) 20 MG capsule Take 1 capsule (20 mg total) by mouth daily. 90 capsule 1   ondansetron  (ZOFRAN ) 8 MG tablet Take 1 tablet (8 mg total) by mouth every 8 (eight) hours as needed for nausea or vomiting. 30 tablet 1   oxyCODONE  (OXY IR/ROXICODONE ) 5 MG immediate release tablet Take 1 tablet (5 mg total) by mouth every 4 (four) hours as needed for severe pain (pain score 7-10) or breakthrough pain. 90 tablet 0   potassium chloride  SA (KLOR-CON  M) 20 MEQ tablet Take 1 tablet (20 mEq total) by mouth daily. 6 tablet 0   prochlorperazine  (COMPAZINE ) 10 MG tablet Take 1 tablet (10 mg total) by mouth every 6 (six) hours as needed for nausea or vomiting. 30 tablet 0   prochlorperazine   (COMPAZINE ) 10 MG tablet Take 1 tablet (10 mg total) by mouth every 6 (six) hours as needed. 30 tablet 2   prochlorperazine  (COMPAZINE ) 10 MG tablet Take 1 tablet (10 mg total) by mouth every 6 (six) hours as needed for nausea or vomiting. 30 tablet 1   rosuvastatin  (CRESTOR ) 5 MG tablet Take 1 tablet (5 mg total) by mouth at bedtime. 90 tablet 1   triamcinolone  0.1%-Eucerin equivalent 1:1 cream mixture Apply topically 3 (three) times daily as needed. 480 g 2   TURMERIC PO Take 1 tablet by mouth 3 (three) times a week.     umeclidinium-vilanterol (ANORO ELLIPTA ) 62.5-25 MCG/ACT AEPB Inhale 1 puff into the lungs daily. 180 each 3   No current facility-administered medications for this visit.    SURGICAL HISTORY:  Past Surgical History:  Procedure Laterality Date   BRONCHIAL BIOPSY  05/26/2022   Procedure: BRONCHIAL BIOPSIES;  Surgeon: Brenna Adine CROME, DO;  Location: [facility];  Service: Pulmonary;;   BRONCHIAL BRUSHINGS  05/26/2022   Procedure: BRONCHIAL BRUSHINGS;  Surgeon: Brenna Adine CROME, DO;  Location: [facility];  Service: Pulmonary;;   NO PAST SURGERIES     VIDEO BRONCHOSCOPY  05/26/2022   Procedure: VIDEO BRONCHOSCOPY WITHOUT FLUORO;  Surgeon: Brenna Adine CROME, DO;  Location: [facility];  Service: Pulmonary;;    REVIEW OF SYSTEMS:   Review of Systems  Constitutional: Positive for weight gain.  Negative for appetite change, chills, and fever.  HENT: Negative for mouth sores, nosebleeds, sore throat and trouble swallowing.   Eyes: Negative for eye problems and icterus.  Respiratory: Positive for dyspnea on exertion. Negative for wheezing.   Cardiovascular: Negative for chest pain and leg swelling.  Gastrointestinal: Negative for abdominal pain,  diarrhea, nausea and vomiting.  Genitourinary: Negative for bladder incontinence, difficulty urinating, dysuria, frequency and hematuria.   Musculoskeletal: Improved bone pain. Negative for back pain, gait problem, neck pain and neck  stiffness.  Skin: Negative for itching and rash.  Neurological: Negative for dizziness, extremity weakness, gait problem, headaches, light-headedness and seizures.  Hematological: Negative for adenopathy. Does not bruise/bleed easily.  Psychiatric/Behavioral: Negative for confusion, depression and sleep disturbance. The patient is not  nervous/anxious.     PHYSICAL EXAMINATION:  Blood pressure 104/64, pulse 99, temperature 98 F (36.7 C), temperature source Temporal, resp. rate 16, weight 125 lb 14.4 oz (57.1 kg), SpO2 100%.  ECOG PERFORMANCE STATUS: 1  Physical Exam  Constitutional: Oriented to person, place, and time and thin appearing male, and in no distress.  HENT:  Head: Normocephalic and atraumatic.  Mouth/Throat: Oropharynx is clear and moist. No oropharyngeal exudate.  Eyes: Conjunctivae are normal. Right eye exhibits no discharge. Left eye exhibits no discharge. No scleral icterus.  Neck: Normal range of motion. Neck supple.  Cardiovascular: Tachycardic, regular rhythm, normal heart sounds and intact distal pulses.   Pulmonary/Chest: Effort normal. Quiet breath sounds bilaterally. No respiratory distress. No wheezes. No rales.  Abdominal: Soft. Bowel sounds are normal. Exhibits no distension and no mass. There is no tenderness.  Musculoskeletal: Some tenderness to palpation over the left hip/low back. Normal range of motion. Exhibits no edema.  Lymphadenopathy:    No cervical adenopathy.  Neurological: Alert and oriented to person, place, and time. Exhibits muscle wasting. Gait normal. Coordination normal.  Skin: Skin is warm and dry. No rash noted. Not diaphoretic. No erythema. No pallor.  Psychiatric: Mood, memory and judgment normal.  Vitals reviewed.  LABORATORY DATA: Lab Results  Component Value Date   WBC 3.7 (L) 12/14/2023   HGB 9.7 (L) 12/14/2023   HCT 30.3 (L) 12/14/2023   MCV 89.1 12/14/2023   PLT 170 12/14/2023      Chemistry      Component Value  Date/Time   NA 141 12/14/2023 1458   NA 146 (H) 01/05/2023 1114   K 3.8 12/14/2023 1458   CL 106 12/14/2023 1458   CO2 31 12/14/2023 1458   BUN 14 12/14/2023 1458   BUN 11 01/05/2023 1114   CREATININE 0.73 12/14/2023 1458   CREATININE 1.07 08/27/2014 1511      Component Value Date/Time   CALCIUM  8.3 (L) 12/14/2023 1458   ALKPHOS 58 12/14/2023 1458   AST 16 12/14/2023 1458   ALT 20 12/14/2023 1458   BILITOT 0.3 12/14/2023 1458       RADIOGRAPHIC STUDIES:  CT CHEST ABDOMEN PELVIS W CONTRAST Result Date: 11/25/2023 CLINICAL DATA:  Non-small-cell lung cancer. Restaging. * Tracking Code: BO * EXAM: CT CHEST, ABDOMEN, AND PELVIS WITH CONTRAST TECHNIQUE: Multidetector CT imaging of the chest, abdomen and pelvis was performed following the standard protocol during bolus administration of intravenous contrast. RADIATION DOSE REDUCTION: This exam was performed according to the departmental dose-optimization program which includes automated exposure control, adjustment of the mA and/or kV according to patient size and/or use of iterative reconstruction technique. CONTRAST:  OMNIPAQUE  IOHEXOL  300 MG/ML  SOLN COMPARISON:  09/08/2023 FINDINGS: CT CHEST FINDINGS Cardiovascular: Aortic atherosclerosis. Normal heart size, without pericardial effusion. No central pulmonary embolism, on this non-dedicated study. Mass-effect upon the left pulmonary arterial tree by mass/adenopathy. Mediastinum/Nodes: Pretracheal adenopathy at 1.6 cm on 28/2. Similar to the prior (when remeasured). Subcarinal adenopathy at 1.3 cm on 32/2, new. Left mediastinal nodal mass of 2.0 x 2.6 cm on 30/2. Compare 2.2 x 2.8 cm on the prior. Lungs/Pleura: No pleural fluid. Mass-effect upon left upper and left lower lobe airway is progressive with similar obstruction of the left upper lobe bronchus including on 79/4. The adjacent or contiguous left upper lobe lung lesion and mediastinal/hilar adenopathy are again identified. The  central left upper lobe nodule measures 2.6 x 2.2 cm on 71/4 versus 2.9 x 2.1 cm on the  prior exam when remeasured in a similar fashion. Upstream left upper lobe mucoid impaction and peribronchovascular tree-in-bud nodularity are increased. Consolidation within the adjacent superior segment left lower lobe is new including on 78/4. Musculoskeletal: Included within the abdomen pelvic section. CT ABDOMEN PELVIS FINDINGS Hepatobiliary: Normal liver. Normal gallbladder, without biliary ductal dilatation. Pancreas: Mild pancreatic atrophy with upper normal duct caliber. No cause identified and no significant change. Spleen: Normal in size, without focal abnormality. Adrenals/Urinary Tract: Normal adrenal glands. Normal kidneys, without hydronephrosis. Normal urinary bladder. Stomach/Bowel: Normal stomach, without wall thickening. Normal large and small bowel loops. Vascular/Lymphatic: Aortic atherosclerosis. No abdominopelvic adenopathy. Reproductive: Mild prostatomegaly. Other: No significant free fluid. No evidence of omental or peritoneal disease. Musculoskeletal: An enhancing left paraspinous muscular mass/metastasis, centered about the L2 level, measures 6.1 x 5.2 by 8.3 cm on 74/2. Increased from 4.0 x 3.8 cm on the prior. A new or significantly enlarged subcutaneous metastasis superficial the left gluteals at 1.6 x 2.3 cm on 109/2. An enhancing nodule in the subcutaneous fat superficial the right gluteals of 11 mm on 121/2 is new. Similar appearance of mixed attenuation, primarily sclerotic lesions within L4 and T8. IMPRESSION: 1. Mild disease progression within the chest. Although the central left upper lobe pulmonary nodule is similar to decreased, there is new subcarinal adenopathy, and progressive left-sided endobronchial compression. 2. New central left lower lobe consolidation. Progressive left upper lobe mucoid impaction and peribronchovascular nodularity. Findings are all likely related to  postobstructive pneumonitis/pneumonia. 3. Relatively similar osseous metastasis. Progressive intramuscular metastasis including a dominant left paraspinous mass as detailed above. 4. No intraabdominal/pelvic metastasis identified. Electronically Signed   By: Rockey Kilts M.D.   On: 11/25/2023 16:27     ASSESSMENT/PLAN:  This is a very pleasant 68 year old African-American male with stage IV (T2a, N2, M1 B non-small cell lung cancer, squamous cell carcinoma.  He presented with a left upper lobe lung mass in addition to AP window lymphadenopathy and suspicious for metastases to T8 and L4 vertebrae as well as right retroperitoneal metastatic nodule.  He was diagnosed in April 2024.   His molecular studies by [email or web address] showed no actionable mutation.  His PD-L1 expression is 70%.   He underwent palliative radiation to the metastatic bone lesions.   He also receives Xgeva  every 6 weeks.     He is underwent palliative systemic chemotherapy and immunotherapy with carboplatin  for AUC of 5, paclitaxel  175 mg/m, and immunotherapy with Libtayo  350 mg IV every 3 weeks with Neulasta  support.  He is status post 4 cycles.     He started maintenance immunotherapy with Libtayo . He is status post 20 cycles of maintenance.    His restaging CT scan from June 2025 showed increased size of the left hilar mass which causes central obstruction of a left upper lobe bronchus, increase in the left hilar and mediastinal lymph nodes, and increased in numerous ground glass pulmonary nodules which are nonspecific.  Dr. Sherrod recommended referral to radiation to discuss palliative radiation to the obstructive lung mass.    He underwent radiation to the left hilar mass which was completed on 08/11/23.    He had a CT scan which showed progression.    Dr. Sherrod recommended docetaxel and Cyramza. He is status post 1 cycle with on 12/08/23.    He tolerated it well overall. We will continue to monitor his labs closely on a  weekly basis.    He will continue taking his iron supplement for anemia.  He does not  need a blood transfusion at this time.  I do have standing orders for sample blood bank should he require a blood transfusion.   Will continue taking his blood thinner for now due to his history of PE.SABRA   He is planning on undergoing palliative radiation to the right gluteal subcutaneous nodule and left paraspinal mass under the care of Dr. Shannon.   Bone pain due to colony stimulating factor injection Bone pain managed with Claritin and Tylenol . Claritin recommended for its effectiveness in reducing pain. - Take Claritin daily for a week with the injection, starting a couple of days before the injection. - Use Tylenol  as needed for pain management. - Consider prescribing Norco or a stronger analgesic if pain is severe and unmanageable with Claritin and Tylenol .  Advised to increase his intake of primer protein drinks to 2x per day.   The patient was advised to call immediately if she has any concerning symptoms in the interval. The patient voices understanding of current disease status and treatment options and is in agreement with the current care plan. All questions were answered. The patient knows to call the clinic with any problems, questions or concerns. We can certainly see the patient much sooner if necessary   Orders Placed This Encounter  Procedures   CBC with Differential (Cancer Center Only)    Standing Status:   Standing    Number of Occurrences:   12    Expiration Date:   12/13/2024   CMP (Cancer Center only)    Standing Status:   Standing    Number of Occurrences:   12    Expiration Date:   12/13/2024   Sample to Blood Bank    Standing Status:   Standing    Number of Occurrences:   3    Next Expected Occurrence:   12/17/2023    Expiration Date:   12/13/2024     The total time spent in the appointment was 20-29 minutes  Bergen Magner L Kaleigha Chamberlin, PA-C 12/14/23

## 2023-12-12 NOTE — Progress Notes (Signed)
 Radiation Oncology         [phone]) [phone] ________________________________  Outpatient Re-Consultation  Name: Albert Hardy. MRN: [id number]  Date: 12/13/2023  DOB: 15-Mar-1955  RR:Hmzzwz, Reyes [name], MD  Sherrod Sherrod, MD   REFERRING PHYSICIAN: Sherrod Sherrod, MD  DIAGNOSIS: There were no encounter diagnoses.  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): presented with a left upper lobe lung mass in addition to AP window lymphadenopathy and suspicious bone metastasis to the T8 and L4 vertebrae in addition to retroperitoneal metastatic soft tissue nodule diagnosed in April 2024: s/p chemotherapy and radiation therapy followed by maintenance immunotherapy  -- Found to have an increase in size of the left hilar mass causing central obstruction of a LUL bronchus in June of 2025: s/p radiation therapy (continued on maintenance immunotherapy)  Now with evidence of mild progressive disease in the chest and progressive intramuscular metastatic disease consisting of a left paraspinous muscular mass/metastasis centered about the L2 level and subcutaneous metastasis superficial the left gluteals (October 2025)   HISTORY OF PRESENT ILLNESS::Albert Hardy. is a 68 y.o. male who is accompanied by ***. he returns today as a courtesy of Dr. Sherrod for an opinion concerning further radiation therapy as part of management for his new progressive intramuscular metastatic disease from a left lung cancer primary.   He was last seen here for a follow-up visit on 09/09/23 and was doing well from a disease standpoint at that time; with his most recent imaging showing stable disease. Of note: He did however endorse some lower left sided back pain at that time and was found to have an area of nodularity at the left SI joint and muscle tightness superiorly. His most recent CT CAP from that time was reviewed which did show a possible lesion  in the left paraspinal muscle - a malignant etiology was ruled out upon further review.   Since his last visit with us  in July, he has continued to follow with Dr. Sherrod and receive maintenance immunotherapy with Libtayo . He tolerated maintenance therapy relatively well without any significant side effects reported. He did however report persistent and significant left lower back pain (as previously reported during his last visit with us ) during a follow-up visit with Dr. Sherrod on 10/19/23. Given his history of osseous metastases, an x-ray of the lumbar spine was performed that day which showed no acute findings in the lumbar spine, and stability of the known osseous metastasis at L4.   He then returned to Dr. Sherrod on 11/10/23 with a new increasing lump on his left buttock. Imaging was recommended to evaluated this finding and he accordingly presented for a restaging CT CAP on 11/23/23 which unfortunately showed evidence of disease progression, characterized by: a new vs significantly enlarged subcutaneous metastasis superficial the left gluteals measuring 1.6 x 2.3 cm (correlating with the recently evaluated left buttocks lump); an increase in size of an enhancing left paraspinous muscular mass/metastasis centered about the L2 level, measuring 6.1 x 5.2 (previously measuring 4.0 x 3.8 cm); and mild disease progression in the chest including new subcarinal adenopathy, and progressive left-sided endobronchial compression. The central LUL nodule otherwise appeared similar to slightly decreased in size. CT findings also included a new central LLL consolidation with progressive LUL mucoid impaction and peribronchovascular nodularity likely related to postobstructive pneumonitis/pneumonia. The known osseous metastatic disease otherwise appeared stable, and there was no evidence of intraabdominal/pelvic metastatic disease.   Given these  findings, Dr. Sherrod recommended discontinuing his maintenance  immunotherapy and transitioning to chemoimmunotherapy consisting of docetaxel and Cyramza. He recently received his first cycle of docetaxel and Cyramza on 12/07/23.   For the progressive intramuscular metastasis, Dr. Sherrod has recommended palliative radiation therapy which we will discuss in detail today.    Dr. Sherrod has also prescribed him azithromycin for the new LLL consolidation seen on his recent CT.    PREVIOUS RADIATION THERAPY: Yes   2) Intent: palliative  Radiation Treatment Dates: First Treatment Date: 2023-07-29 -- Last Treatment Date: 2023-08-11 Site/Dose/Technique/Mode:  Plan Name: Lung_L Site: Lung, Left Technique: IMRT Mode: Photon Dose Per Fraction: 3 Gy Prescribed Dose (Delivered / Prescribed): 30 Gy / 30 Gy Prescribed Fxs (Delivered / Prescribed): 10 / 10  1) Indication for treatment: palliative       Radiation treatment dates: 06/25/22 through 07/10/22 Site/dose:  1) lumbar spine - 30 Gy delivered in 10 Fx at 3 Gy/Fx 2) thoracic spine - 30 Gy delivered in 10 Fx at 3 Gy/Fx Beams/energy: 15X, 10X Technique/Mode: 3D / Photon   PAST MEDICAL HISTORY:  Past Medical History:  Diagnosis Date   Aortic regurgitation    Aortic stenosis 04/04/2018   Atherosclerosis of native arteries of the extremities with intermittent claudication 09/08/2013   Atherosclerosis of native artery of extremity with intermittent claudication 09/08/2013   IMO SNOMED Dx Update Oct 2024     Bilateral impacted cerumen 10/13/2018   Bilateral sensorineural hearing loss 12/14/2018   Cancer (HCC)    lung   Dyspnea    Encounter for antineoplastic chemotherapy 08/18/2022   Encounter for antineoplastic immunotherapy 08/18/2022   Former moderate cigarette smoker (10-19 per day) 05/20/2012   Quit smoking Nov 2013 when hospitalized w/ HTN and chest pain(diagnosed w/ valvular heart disease)   GERD (gastroesophageal reflux disease)    Heart murmur    History of radiation therapy    Lumbar  Spine, Thoracic Spine- 06/25/22-07/10/22- Dr. Lynwood Nasuti   History of radiation therapy    Left lung-07/29/23-08/11/23- Dr. 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   Substance abuse (HCC)     PAST SURGICAL HISTORY: Past Surgical History:  Procedure Laterality Date   BRONCHIAL BIOPSY  05/26/2022   Procedure: BRONCHIAL BIOPSIES;  Surgeon: Brenna Adine CROME, DO;  Location: [facility];  Service: Pulmonary;;   BRONCHIAL BRUSHINGS  05/26/2022   Procedure: BRONCHIAL BRUSHINGS;  Surgeon: Brenna Adine CROME, DO;  Location: [facility];  Service: Pulmonary;;   NO PAST SURGERIES     VIDEO BRONCHOSCOPY  05/26/2022   Procedure: VIDEO BRONCHOSCOPY WITHOUT FLUORO;  Surgeon: Brenna Adine CROME, DO;  Location: [facility];  Service: Pulmonary;;    FAMILY HISTORY:  Family History  Problem Relation Age of Onset   Other Brother    Hypertension Brother    Hyperlipidemia Brother    Arthritis Mother    Hypertension Mother    Hyperlipidemia Mother    Colon cancer Mother        5's   Hypertension Sister    Hyperlipidemia Sister    Diabetes Sister    Heart attack Father    Hyperlipidemia Brother     SOCIAL HISTORY:  Social History   Tobacco Use  Smoking status: Former    Current packs/day: 0.00    Average packs/day: 1 pack/day for 30.0 years (30.0 ttl pk-yrs)    Types: Cigarettes    Start date: 12/20/1981    Quit date: 12/21/2011    Years since quitting: 11.9   Smokeless tobacco: Never  Vaping Use   Vaping status: Every Day  Substance Use Topics   Alcohol use: Yes    Comment: Ocassionaly.    Drug use: Yes    Frequency: 1.0 times per week    Types: Marijuana    ALLERGIES:  Allergies  Allergen Reactions   Lipitor [Atorvastatin]      myalgia   Pravastatin Other (See Comments)    myalgia    MEDICATIONS:  Current Outpatient Medications  Medication Sig Dispense Refill   albuterol  (VENTOLIN  HFA) 108 (90 Base) MCG/ACT inhaler Inhale 2 puffs into the lungs every 6 (six) hours as needed for wheezing or shortness of breath. 8 g 6   Ascorbic Acid (VITAMIN C PO) Take 1 tablet by mouth daily.     azithromycin (ZITHROMAX Z-PAK) 250 MG tablet Take as directed 6 each 0   benzonatate  (TESSALON ) 100 MG capsule Take 1 capsule (100 mg total) by mouth 3 (three) times daily as needed. 30 capsule 2   Cholecalciferol (VITAMIN D3) 1000 UNITS CAPS Take 1,000 Units by mouth daily.     Coenzyme Q10 (CO Q 10 PO) Take 1 tablet by mouth daily.     Cyanocobalamin (VITAMIN B-12 PO) Take 1 tablet by mouth daily.     cyclobenzaprine (FLEXERIL) 5 MG tablet Take 1 tablet (5 mg total) by mouth 3 (three) times daily as needed for muscle spasms. 30 tablet 0   dexamethasone  (DECADRON ) 4 MG tablet Take 2 tablets TWICE a day the day before, the day of, and the day after chemotherapy 40 tablet 2   dexamethasone  (DECADRON ) 4 MG tablet Take 2 tabs by mouth 2 times daily starting day before chemo. Then take 2 tabs daily for 2 days starting day after chemo. Take with food. 30 tablet 1   ELIQUIS  5 MG TABS tablet Take 1 tablet by mouth twice daily 60 tablet 0   ezetimibe  (ZETIA ) 10 MG tablet Take 1 tablet (10 mg total) by mouth daily. 90 tablet 1   fluticasone  (FLONASE ) 50 MCG/ACT nasal spray Place 1-2 sprays into both nostrils daily. 16 g 6   gabapentin  (NEURONTIN ) 300 MG capsule Take 1 capsule by mouth twice daily 60 capsule 0   KRILL OIL PO Take 1 tablet by mouth daily.     lisinopril  (ZESTRIL ) 10 MG tablet Take 1 tablet (10 mg total) by mouth daily. 90 tablet 3   MAGNESIUM PO Take 1 tablet by mouth daily.     methylPREDNISolone  (MEDROL  DOSEPAK) 4 MG TBPK tablet Take with signs of chronic sinusitis and take as directed 1 each 1   metoCLOPramide  (REGLAN ) 10 MG  tablet Take 1 tablet (10 mg total) by mouth 4 (four) times daily -  before meals and at bedtime. 56 tablet 0   metoprolol  tartrate (LOPRESSOR ) 25 MG tablet Take 1 tablet (25 mg total) by mouth once for 1 dose. Please take this medication 2 hours before CT 1 tablet 0   morphine  (MS CONTIN ) 15 MG 12 hr tablet Take 1 tablet (15 mg total) by mouth every 8 (eight) hours. 90 tablet 0   Multiple Vitamins-Minerals (CENTRUM SILVER PO) Take 1 tablet by mouth daily.     Multiple Vitamins-Minerals (ZINC PO)  Take 1 tablet by mouth daily.     Naphazoline-Pheniramine (OPCON-A) 0.027-0.315 % SOLN Place 1 drop into both eyes daily as needed (redness).     nitroGLYCERIN  (NITROSTAT ) 0.4 MG SL tablet Place 1 tablet (0.4 mg total) under the tongue every 5 (five) minutes as needed for chest pain. 25 tablet 1   omeprazole  (PRILOSEC) 20 MG capsule Take 1 capsule (20 mg total) by mouth daily. 90 capsule 1   ondansetron  (ZOFRAN ) 8 MG tablet Take 1 tablet (8 mg total) by mouth every 8 (eight) hours as needed for nausea or vomiting. 30 tablet 1   oxyCODONE  (OXY IR/ROXICODONE ) 5 MG immediate release tablet Take 1 tablet (5 mg total) by mouth every 4 (four) hours as needed for severe pain (pain score 7-10) or breakthrough pain. 90 tablet 0   potassium chloride  SA (KLOR-CON  M) 20 MEQ tablet Take 1 tablet (20 mEq total) by mouth daily. 6 tablet 0   prochlorperazine  (COMPAZINE ) 10 MG tablet Take 1 tablet (10 mg total) by mouth every 6 (six) hours as needed for nausea or vomiting. 30 tablet 0   prochlorperazine  (COMPAZINE ) 10 MG tablet Take 1 tablet (10 mg total) by mouth every 6 (six) hours as needed. 30 tablet 2   prochlorperazine  (COMPAZINE ) 10 MG tablet Take 1 tablet (10 mg total) by mouth every 6 (six) hours as needed for nausea or vomiting. 30 tablet 1   rosuvastatin  (CRESTOR ) 5 MG tablet Take 1 tablet (5 mg total) by mouth at bedtime. 90 tablet 1   sucralfate  (CARAFATE ) 1 g tablet Take 1 tablet (1 g total) by mouth 4 (four)  times daily -  with meals and at bedtime. Crush and dissolve in 10 mL's of warm water prior to swallowing, take 20 min prior to meals 60 tablet 1   triamcinolone  0.1%-Eucerin equivalent 1:1 cream mixture Apply topically 3 (three) times daily as needed. 480 g 2   TURMERIC PO Take 1 tablet by mouth 3 (three) times a week.     umeclidinium-vilanterol (ANORO ELLIPTA ) 62.5-25 MCG/ACT AEPB Inhale 1 puff into the lungs daily. 180 each 3   No current facility-administered medications for this encounter.    REVIEW OF SYSTEMS:  A 10+ POINT REVIEW OF SYSTEMS WAS OBTAINED including neurology, dermatology, psychiatry, cardiac, respiratory, lymph, extremities, GI, GU, musculoskeletal, constitutional, reproductive, HEENT. ***   PHYSICAL EXAM:  vitals were not taken for this visit.   General: Alert and oriented, in no acute distress HEENT: Head is normocephalic. Extraocular movements are intact. Oropharynx is clear. Neck: Neck is supple, no palpable cervical or supraclavicular lymphadenopathy. Heart: Regular in rate and rhythm with no murmurs, rubs, or gallops. Chest: Clear to auscultation bilaterally, with no rhonchi, wheezes, or rales. Abdomen: Soft, nontender, nondistended, with no rigidity or guarding. Extremities: No cyanosis or edema. Lymphatics: see Neck Exam Skin: No concerning lesions. Musculoskeletal: symmetric strength and muscle tone throughout. Neurologic: Cranial nerves II through XII are grossly intact. No obvious focalities. Speech is fluent. Coordination is intact. Psychiatric: Judgment and insight are intact. Affect is appropriate. ***  ECOG = ***  0 - Asymptomatic (Fully active, able to carry on all predisease activities without restriction)  1 - Symptomatic but completely ambulatory (Restricted in physically strenuous activity but ambulatory and able to carry out work of a light or sedentary nature. For example, light housework, office work)  2 - Symptomatic, <50% in bed during the  day (Ambulatory and capable of all self care but unable to carry out any work  activities. Up and about more than 50% of waking hours)  3 - Symptomatic, >50% in bed, but not bedbound (Capable of only limited self-care, confined to bed or chair 50% or more of waking hours)  4 - Bedbound (Completely disabled. Cannot carry on any self-care. Totally confined to bed or chair)  5 - Death   Raylene MM, Creech RH, Tormey DC, et al. [phone]). Toxicity and response criteria of the [facility]. Am. DOROTHA Bridges. Oncol. 5 (6): 649-55  LABORATORY DATA:  Lab Results  Component Value Date   WBC 8.8 12/07/2023   HGB 10.2 (L) 12/07/2023   HCT 31.0 (L) 12/07/2023   MCV 88.1 12/07/2023   PLT 281 12/07/2023   NEUTROABS 7.9 (H) 12/07/2023   Lab Results  Component Value Date   NA 135 12/07/2023   K 4.2 12/07/2023   CL 99 12/07/2023   CO2 27 12/07/2023   GLUCOSE 118 (H) 12/07/2023   BUN 25 (H) 12/07/2023   CREATININE 0.88 12/07/2023   CALCIUM  9.1 12/07/2023      RADIOGRAPHY: CT CHEST ABDOMEN PELVIS W CONTRAST Result Date: 11/25/2023 CLINICAL DATA:  Non-small-cell lung cancer. Restaging. * Tracking Code: BO * EXAM: CT CHEST, ABDOMEN, AND PELVIS WITH CONTRAST TECHNIQUE: Multidetector CT imaging of the chest, abdomen and pelvis was performed following the standard protocol during bolus administration of intravenous contrast. RADIATION DOSE REDUCTION: This exam was performed according to the departmental dose-optimization program which includes automated exposure control, adjustment of the mA and/or kV according to patient size and/or use of iterative reconstruction technique. CONTRAST:  OMNIPAQUE  IOHEXOL  300 MG/ML  SOLN COMPARISON:  09/08/2023 FINDINGS: CT CHEST FINDINGS Cardiovascular: Aortic atherosclerosis. Normal heart size, without pericardial effusion. No central pulmonary embolism, on this non-dedicated study. Mass-effect upon the left pulmonary arterial tree by mass/adenopathy.  Mediastinum/Nodes: Pretracheal adenopathy at 1.6 cm on 28/2. Similar to the prior (when remeasured). Subcarinal adenopathy at 1.3 cm on 32/2, new. Left mediastinal nodal mass of 2.0 x 2.6 cm on 30/2. Compare 2.2 x 2.8 cm on the prior. Lungs/Pleura: No pleural fluid. Mass-effect upon left upper and left lower lobe airway is progressive with similar obstruction of the left upper lobe bronchus including on 79/4. The adjacent or contiguous left upper lobe lung lesion and mediastinal/hilar adenopathy are again identified. The central left upper lobe nodule measures 2.6 x 2.2 cm on 71/4 versus 2.9 x 2.1 cm on the prior exam when remeasured in a similar fashion. Upstream left upper lobe mucoid impaction and peribronchovascular tree-in-bud nodularity are increased. Consolidation within the adjacent superior segment left lower lobe is new including on 78/4. Musculoskeletal: Included within the abdomen pelvic section. CT ABDOMEN PELVIS FINDINGS Hepatobiliary: Normal liver. Normal gallbladder, without biliary ductal dilatation. Pancreas: Mild pancreatic atrophy with upper normal duct caliber. No cause identified and no significant change. Spleen: Normal in size, without focal abnormality. Adrenals/Urinary Tract: Normal adrenal glands. Normal kidneys, without hydronephrosis. Normal urinary bladder. Stomach/Bowel: Normal stomach, without wall thickening. Normal large and small bowel loops. Vascular/Lymphatic: Aortic atherosclerosis. No abdominopelvic adenopathy. Reproductive: Mild prostatomegaly. Other: No significant free fluid. No evidence of omental or peritoneal disease. Musculoskeletal: An enhancing left paraspinous muscular mass/metastasis, centered about the L2 level, measures 6.1 x 5.2 by 8.3 cm on 74/2. Increased from 4.0 x 3.8 cm on the prior. A new or significantly enlarged subcutaneous metastasis superficial the left gluteals at 1.6 x 2.3 cm on 109/2. An enhancing nodule in the subcutaneous fat superficial the  right gluteals of 11 mm  on 121/2 is new. Similar appearance of mixed attenuation, primarily sclerotic lesions within L4 and T8. IMPRESSION: 1. Mild disease progression within the chest. Although the central left upper lobe pulmonary nodule is similar to decreased, there is new subcarinal adenopathy, and progressive left-sided endobronchial compression. 2. New central left lower lobe consolidation. Progressive left upper lobe mucoid impaction and peribronchovascular nodularity. Findings are all likely related to postobstructive pneumonitis/pneumonia. 3. Relatively similar osseous metastasis. Progressive intramuscular metastasis including a dominant left paraspinous mass as detailed above. 4. No intraabdominal/pelvic metastasis identified. Electronically Signed   By: Rockey Kilts M.D.   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): presented with a left upper lobe lung mass in addition to AP window lymphadenopathy and suspicious bone metastasis to the T8 and L4 vertebrae in addition to retroperitoneal metastatic soft tissue nodule diagnosed in April 2024: s/p chemotherapy and radiation therapy followed by maintenance immunotherapy  -- Found to have an increase in size of the left hilar mass causing central obstruction of a LUL bronchus in June of 2025: s/p radiation therapy (continued on maintenance immunotherapy)  Now with evidence of mild progressive disease in the chest and progressive intramuscular metastatic disease consisting of a left paraspinous muscular mass/metastasis centered about the L2 level and subcutaneous metastasis superficial the left gluteals (October 2025)   ***  Today, I talked to the patient and family about the findings and work-up thus far.  We discussed the natural history of *** and general treatment, highlighting the role of radiotherapy in the management.  We discussed  the available radiation techniques, and focused on the details of logistics and delivery.  We reviewed the anticipated acute and late sequelae associated with radiation in this setting.  The patient was encouraged to ask questions that I answered to the best of my ability. *** A patient consent form was discussed and signed.  We retained a copy for our records.  The patient would like to proceed with radiation and will be scheduled for CT simulation.  PLAN: ***    *** minutes of total time was spent for this patient encounter, including preparation, face-to-face counseling with the patient and coordination of care, physical exam, and documentation of the encounter.   ------------------------------------------------  Lynwood CHARM Nasuti, PhD, MD  This document serves as a record of services personally performed by Lynwood Nasuti, MD. It was created on his behalf by Dorthy Fuse, a trained medical scribe. The creation of this record is based on the scribe's personal observations and the provider's statements to them. This document has been checked and approved by the attending provider.

## 2023-12-13 ENCOUNTER — Ambulatory Visit
Admission: RE | Admit: 2023-12-13 | Discharge: 2023-12-13 | Disposition: A | Discharge status: Home / Self Care | Source: Ambulatory Visit | Attending: Radiation Oncology | Admitting: Radiation Oncology

## 2023-12-13 ENCOUNTER — Encounter: Payer: Self-pay | Admitting: Radiation Oncology

## 2023-12-13 VITALS — BP 108/66 | HR 93 | Temp 98.3°F | Resp 18 | Ht 69.0 in | Wt 121.4 lb

## 2023-12-13 DIAGNOSIS — I352 Nonrheumatic aortic (valve) stenosis with insufficiency: Secondary | ICD-10-CM | POA: Insufficient documentation

## 2023-12-13 DIAGNOSIS — K8689 Other specified diseases of pancreas: Secondary | ICD-10-CM | POA: Insufficient documentation

## 2023-12-13 DIAGNOSIS — Z7901 Long term (current) use of anticoagulants: Secondary | ICD-10-CM | POA: Diagnosis not present

## 2023-12-13 DIAGNOSIS — C7951 Secondary malignant neoplasm of bone: Secondary | ICD-10-CM | POA: Diagnosis present

## 2023-12-13 DIAGNOSIS — M545 Low back pain, unspecified: Secondary | ICD-10-CM | POA: Insufficient documentation

## 2023-12-13 DIAGNOSIS — K219 Gastro-esophageal reflux disease without esophagitis: Secondary | ICD-10-CM | POA: Insufficient documentation

## 2023-12-13 DIAGNOSIS — Z7952 Long term (current) use of systemic steroids: Secondary | ICD-10-CM | POA: Insufficient documentation

## 2023-12-13 DIAGNOSIS — I10 Essential (primary) hypertension: Secondary | ICD-10-CM | POA: Diagnosis not present

## 2023-12-13 DIAGNOSIS — J431 Panlobular emphysema: Secondary | ICD-10-CM | POA: Diagnosis not present

## 2023-12-13 DIAGNOSIS — Z923 Personal history of irradiation: Secondary | ICD-10-CM | POA: Insufficient documentation

## 2023-12-13 DIAGNOSIS — F17210 Nicotine dependence, cigarettes, uncomplicated: Secondary | ICD-10-CM | POA: Insufficient documentation

## 2023-12-13 DIAGNOSIS — I70209 Unspecified atherosclerosis of native arteries of extremities, unspecified extremity: Secondary | ICD-10-CM | POA: Diagnosis not present

## 2023-12-13 DIAGNOSIS — I700 Atherosclerosis of aorta: Secondary | ICD-10-CM | POA: Insufficient documentation

## 2023-12-13 DIAGNOSIS — C3412 Malignant neoplasm of upper lobe, left bronchus or lung: Secondary | ICD-10-CM | POA: Diagnosis not present

## 2023-12-13 DIAGNOSIS — R011 Cardiac murmur, unspecified: Secondary | ICD-10-CM | POA: Diagnosis not present

## 2023-12-13 DIAGNOSIS — Z800 Family history of malignant neoplasm of digestive organs: Secondary | ICD-10-CM | POA: Insufficient documentation

## 2023-12-13 DIAGNOSIS — R222 Localized swelling, mass and lump, trunk: Secondary | ICD-10-CM | POA: Diagnosis not present

## 2023-12-13 DIAGNOSIS — C3492 Malignant neoplasm of unspecified part of left bronchus or lung: Secondary | ICD-10-CM

## 2023-12-13 DIAGNOSIS — E785 Hyperlipidemia, unspecified: Secondary | ICD-10-CM | POA: Diagnosis not present

## 2023-12-13 DIAGNOSIS — N400 Enlarged prostate without lower urinary tract symptoms: Secondary | ICD-10-CM | POA: Insufficient documentation

## 2023-12-13 DIAGNOSIS — Z79899 Other long term (current) drug therapy: Secondary | ICD-10-CM | POA: Insufficient documentation

## 2023-12-13 NOTE — Progress Notes (Signed)
 Histology and Location of Primary Cancer:  Left upper lobe Now with evidence of mild progressive disease in the chest and progressive intramuscular metastatic disease consisting of a left paraspinous muscular mass/metastasis centered about the L2 level and subcutaneous metastasis superficial the left gluteals (October 2025)    Past/Anticipated chemotherapy by medical oncology, if any:    Pain on a scale of 0-10 is: 8 reports having joint pain. Reports pain is constant    If Spine Met(s), symptoms, if any, include: Bowel/Bladder retention or incontinence (please describe): No Numbness or weakness in extremities (please describe): Reports having neuropathy in hands and toes.  Current Decadron  regimen, if applicable: 4mg - take 2 tabs twice a day before the day of and the day after chemo.   Ambulatory status? Walker? Wheelchair?: Ambulatory  SAFETY ISSUES: Prior radiation? yes Pacemaker/ICD? no Possible current pregnancy? no Is the patient on methotrexate? no  Current Complaints / other details:    BP 108/66 (BP Location: Right Arm, Patient Position: Sitting, Cuff Size: Large)   Pulse 93   Temp 98.3 F (36.8 C)   Resp 18   Ht 5' 9 (1.753 m)   Wt 121 lb 6.4 oz (55.1 kg)   SpO2 100%   BMI 17.93 kg/m

## 2023-12-14 ENCOUNTER — Inpatient Hospital Stay (HOSPITAL_BASED_OUTPATIENT_CLINIC_OR_DEPARTMENT_OTHER): Admitting: Physician Assistant

## 2023-12-14 ENCOUNTER — Inpatient Hospital Stay

## 2023-12-14 VITALS — BP 104/64 | HR 99 | Temp 98.0°F | Resp 16 | Wt 125.9 lb

## 2023-12-14 DIAGNOSIS — C3492 Malignant neoplasm of unspecified part of left bronchus or lung: Secondary | ICD-10-CM | POA: Diagnosis not present

## 2023-12-14 DIAGNOSIS — Z87891 Personal history of nicotine dependence: Secondary | ICD-10-CM | POA: Insufficient documentation

## 2023-12-14 DIAGNOSIS — Z5112 Encounter for antineoplastic immunotherapy: Secondary | ICD-10-CM | POA: Insufficient documentation

## 2023-12-14 DIAGNOSIS — Z5111 Encounter for antineoplastic chemotherapy: Secondary | ICD-10-CM | POA: Insufficient documentation

## 2023-12-14 DIAGNOSIS — Z5189 Encounter for other specified aftercare: Secondary | ICD-10-CM | POA: Insufficient documentation

## 2023-12-14 DIAGNOSIS — Z79899 Other long term (current) drug therapy: Secondary | ICD-10-CM | POA: Insufficient documentation

## 2023-12-14 DIAGNOSIS — C7951 Secondary malignant neoplasm of bone: Secondary | ICD-10-CM | POA: Insufficient documentation

## 2023-12-14 DIAGNOSIS — C3412 Malignant neoplasm of upper lobe, left bronchus or lung: Secondary | ICD-10-CM | POA: Insufficient documentation

## 2023-12-14 LAB — CBC WITH DIFFERENTIAL (CANCER CENTER ONLY)
Abs Immature Granulocytes: 0.3 K/uL — ABNORMAL HIGH (ref 0.00–0.07)
Band Neutrophils: 7 %
Basophils Absolute: 0 K/uL (ref 0.0–0.1)
Basophils Relative: 0 %
Eosinophils Absolute: 0 K/uL (ref 0.0–0.5)
Eosinophils Relative: 0 %
HCT: 30.3 % — ABNORMAL LOW (ref 39.0–52.0)
Hemoglobin: 9.7 g/dL — ABNORMAL LOW (ref 13.0–17.0)
Lymphocytes Relative: 16 %
Lymphs Abs: 0.6 K/uL — ABNORMAL LOW (ref 0.7–4.0)
MCH: 28.5 pg (ref 26.0–34.0)
MCHC: 32 g/dL (ref 30.0–36.0)
MCV: 89.1 fL (ref 80.0–100.0)
Metamyelocytes Relative: 8 %
Monocytes Absolute: 0.7 K/uL (ref 0.1–1.0)
Monocytes Relative: 18 %
Neutro Abs: 2.1 K/uL (ref 1.7–7.7)
Neutrophils Relative %: 51 %
Platelet Count: 170 K/uL (ref 150–400)
RBC: 3.4 MIL/uL — ABNORMAL LOW (ref 4.22–5.81)
RDW: 14.4 % (ref 11.5–15.5)
WBC Count: 3.7 K/uL — ABNORMAL LOW (ref 4.0–10.5)
nRBC: 1.1 % — ABNORMAL HIGH (ref 0.0–0.2)

## 2023-12-14 LAB — TOTAL PROTEIN, URINE DIPSTICK: Protein, ur: 30 mg/dL — AB

## 2023-12-14 LAB — CMP (CANCER CENTER ONLY)
ALT: 20 U/L (ref 0–44)
AST: 16 U/L (ref 15–41)
Albumin: 3 g/dL — ABNORMAL LOW (ref 3.5–5.0)
Alkaline Phosphatase: 58 U/L (ref 38–126)
Anion gap: 4 — ABNORMAL LOW (ref 5–15)
BUN: 14 mg/dL (ref 8–23)
CO2: 31 mmol/L (ref 22–32)
Calcium: 8.3 mg/dL — ABNORMAL LOW (ref 8.9–10.3)
Chloride: 106 mmol/L (ref 98–111)
Creatinine: 0.73 mg/dL (ref 0.61–1.24)
GFR, Estimated: >60 mL/min (ref >60)
Glucose, Bld: 133 mg/dL — ABNORMAL HIGH (ref 70–99)
Potassium: 3.8 mmol/L (ref 3.5–5.1)
Sodium: 141 mmol/L (ref 135–145)
Total Bilirubin: 0.3 mg/dL (ref 0.0–1.2)
Total Protein: 6.3 g/dL — ABNORMAL LOW (ref 6.5–8.1)

## 2023-12-16 ENCOUNTER — Telehealth: Payer: Self-pay | Admitting: Nurse Practitioner

## 2023-12-20 ENCOUNTER — Other Ambulatory Visit: Payer: Self-pay

## 2023-12-20 ENCOUNTER — Inpatient Hospital Stay

## 2023-12-20 ENCOUNTER — Other Ambulatory Visit: Payer: Self-pay | Admitting: Nurse Practitioner

## 2023-12-20 DIAGNOSIS — Z510 Encounter for antineoplastic radiation therapy: Secondary | ICD-10-CM | POA: Diagnosis not present

## 2023-12-20 DIAGNOSIS — Z515 Encounter for palliative care: Secondary | ICD-10-CM

## 2023-12-20 DIAGNOSIS — G893 Neoplasm related pain (acute) (chronic): Secondary | ICD-10-CM

## 2023-12-20 DIAGNOSIS — C3492 Malignant neoplasm of unspecified part of left bronchus or lung: Secondary | ICD-10-CM

## 2023-12-20 LAB — CBC WITH DIFFERENTIAL (CANCER CENTER ONLY)
Abs Immature Granulocytes: 0.26 K/uL — ABNORMAL HIGH (ref 0.00–0.07)
Basophils Absolute: 0 K/uL (ref 0.0–0.1)
Basophils Relative: 0 %
Eosinophils Absolute: 0 K/uL (ref 0.0–0.5)
Eosinophils Relative: 0 %
HCT: 35.3 % — ABNORMAL LOW (ref 39.0–52.0)
Hemoglobin: 11.3 g/dL — ABNORMAL LOW (ref 13.0–17.0)
Immature Granulocytes: 2 %
Lymphocytes Relative: 7 %
Lymphs Abs: 0.9 K/uL (ref 0.7–4.0)
MCH: 28.5 pg (ref 26.0–34.0)
MCHC: 32 g/dL (ref 30.0–36.0)
MCV: 88.9 fL (ref 80.0–100.0)
Monocytes Absolute: 0.6 K/uL (ref 0.1–1.0)
Monocytes Relative: 4 %
Neutro Abs: 11.3 K/uL — ABNORMAL HIGH (ref 1.7–7.7)
Neutrophils Relative %: 87 %
Platelet Count: 263 K/uL (ref 150–400)
RBC: 3.97 MIL/uL — ABNORMAL LOW (ref 4.22–5.81)
RDW: 15.7 % — ABNORMAL HIGH (ref 11.5–15.5)
Smear Review: NORMAL
WBC Count: 13.1 K/uL — ABNORMAL HIGH (ref 4.0–10.5)
nRBC: 0.2 % (ref 0.0–0.2)

## 2023-12-20 LAB — CMP (CANCER CENTER ONLY)
ALT: 19 U/L (ref 0–44)
AST: 24 U/L (ref 15–41)
Albumin: 3.1 g/dL — ABNORMAL LOW (ref 3.5–5.0)
Alkaline Phosphatase: 88 U/L (ref 38–126)
Anion gap: 9 (ref 5–15)
BUN: 12 mg/dL (ref 8–23)
CO2: 28 mmol/L (ref 22–32)
Calcium: 8.6 mg/dL — ABNORMAL LOW (ref 8.9–10.3)
Chloride: 104 mmol/L (ref 98–111)
Creatinine: 0.78 mg/dL (ref 0.61–1.24)
GFR, Estimated: >60 mL/min (ref >60)
Glucose, Bld: 91 mg/dL (ref 70–99)
Potassium: 3.5 mmol/L (ref 3.5–5.1)
Sodium: 141 mmol/L (ref 135–145)
Total Bilirubin: 0.3 mg/dL (ref 0.0–1.2)
Total Protein: 6.7 g/dL (ref 6.5–8.1)

## 2023-12-20 LAB — SAMPLE TO BLOOD BANK

## 2023-12-20 MED ORDER — OXYCODONE HCL 5 MG PO TABS: 5.0000 mg | ORAL_TABLET | ORAL | 0 refills | Status: DC | PRN

## 2023-12-21 ENCOUNTER — Inpatient Hospital Stay: Admitting: Physician Assistant

## 2023-12-21 ENCOUNTER — Inpatient Hospital Stay

## 2023-12-21 ENCOUNTER — Ambulatory Visit
Admission: RE | Admit: 2023-12-21 | Discharge: 2023-12-21 | Disposition: A | Discharge status: Home / Self Care | Source: Ambulatory Visit | Attending: Radiation Oncology | Admitting: Radiation Oncology

## 2023-12-21 ENCOUNTER — Inpatient Hospital Stay: Admitting: Dietician

## 2023-12-21 DIAGNOSIS — Z510 Encounter for antineoplastic radiation therapy: Secondary | ICD-10-CM | POA: Insufficient documentation

## 2023-12-21 DIAGNOSIS — C7951 Secondary malignant neoplasm of bone: Secondary | ICD-10-CM | POA: Insufficient documentation

## 2023-12-21 DIAGNOSIS — C3412 Malignant neoplasm of upper lobe, left bronchus or lung: Secondary | ICD-10-CM | POA: Insufficient documentation

## 2023-12-21 DIAGNOSIS — Z87891 Personal history of nicotine dependence: Secondary | ICD-10-CM | POA: Insufficient documentation

## 2023-12-21 DIAGNOSIS — C3492 Malignant neoplasm of unspecified part of left bronchus or lung: Secondary | ICD-10-CM

## 2023-12-23 DIAGNOSIS — Z510 Encounter for antineoplastic radiation therapy: Secondary | ICD-10-CM | POA: Diagnosis not present

## 2023-12-26 NOTE — Progress Notes (Unsigned)
 [facility]   Telephone:(336) [phone] Fax:(336) [phone]    Patient Care Team: Levora Albert Hardy [name], MD as PCP - General (Family Medicine)   CHIEF COMPLAINT: Follow up lung cancer   PRIOR THERAPY: 1) Palliative radiotherapy to the T8 and L4 metastatic bone disease. 2) Systemic chemotherapy with carboplatin  for AUC of 5, paclitaxel  175 Mg/M2 and Libtayo  (Cempilimab) 350 Mg IV every 3 weeks with Neulasta  support for 4 cycles  3) Palliative radiation to the left hilar mass under the care of Dr. Shannon. Last dose on 08/11/23.  4)  Maintenance treatment with single agent Libtayo  (Cempilimab) 350 Mg IV every 3 weeks.  First dose September 08, 2022.  Status post 20 cycles.    CURRENT THERAPY: 1) docetaxel and Cyramza IV every 3 weeks.  First as expected on 12/08/23.  2) Xgeva  every 6 weeks 3) Palliative radiation to the left paraspinal mass and right gluteal subcutaneous nodule under the care of Dr. Shannon  INTERVAL HISTORY Albert Hardy returns for follow up as scheduled. He completed cycle 1 taxotere/cyramza.   ROS   Past Medical History:  Diagnosis Date   Aortic regurgitation    Aortic stenosis 04/04/2018   Atherosclerosis of native arteries of the extremities with intermittent claudication 09/08/2013   Atherosclerosis of native artery of extremity with intermittent claudication 09/08/2013   IMO SNOMED Dx Update Oct 2024     Bilateral impacted cerumen 10/13/2018   Bilateral sensorineural hearing loss 12/14/2018   Cancer (HCC)    lung   Dyspnea    Encounter for antineoplastic chemotherapy 08/18/2022   Encounter for antineoplastic immunotherapy 08/18/2022   Former moderate cigarette smoker (10-19 per day) 05/20/2012   Quit smoking Nov 2013 when hospitalized w/ HTN and chest pain(diagnosed w/ valvular heart disease)   GERD (gastroesophageal reflux disease)    Heart murmur    History of radiation therapy    Lumbar Spine, Thoracic Spine- 06/25/22-07/10/22- Dr. Lynwood Shannon    History of radiation therapy    Left lung-07/29/23-08/11/23- Dr. 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   Peripheral vascular disease with claudication    ABI .49     Squamous cell carcinoma of lung, stage IV, left (HCC) 05/29/2022   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   Substance abuse (HCC)      Past Surgical History:  Procedure Laterality Date   BRONCHIAL BIOPSY  05/26/2022   Procedure: BRONCHIAL BIOPSIES;  Surgeon: Brenna Adine CROME, DO;  Location: [facility];  Service: Pulmonary;;   BRONCHIAL BRUSHINGS  05/26/2022   Procedure: BRONCHIAL BRUSHINGS;  Surgeon: Brenna Adine CROME, DO;  Location: [facility];  Service: Pulmonary;;   NO PAST SURGERIES     VIDEO BRONCHOSCOPY  05/26/2022   Procedure: VIDEO BRONCHOSCOPY WITHOUT FLUORO;  Surgeon: Brenna Adine CROME, DO;  Location: [facility];  Service: Pulmonary;;     Outpatient Encounter Medications as of 12/28/2023  Medication Sig   albuterol  (VENTOLIN  HFA) 108 (90 Base) MCG/ACT inhaler Inhale 2 puffs into the lungs every 6 (six) hours as needed for wheezing or shortness of breath.   Ascorbic Acid (VITAMIN C PO) Take 1 tablet by mouth daily.   azithromycin (ZITHROMAX Z-PAK) 250 MG tablet Take as directed   benzonatate  (TESSALON ) 100 MG capsule Take 1 capsule (100 mg total) by mouth 3 (three) times daily as needed.   Cholecalciferol (VITAMIN D3) 1000 UNITS CAPS Take 1,000 Units  by mouth daily.   Coenzyme Q10 (CO Q 10 PO) Take 1 tablet by mouth daily.   Cyanocobalamin (VITAMIN B-12 PO) Take 1 tablet by mouth daily.   cyclobenzaprine (FLEXERIL) 5 MG tablet Take 1 tablet (5 mg total) by mouth 3 (three) times daily as needed for muscle spasms.   dexamethasone  (DECADRON ) 4 MG tablet Take 2 tablets TWICE a day the day before, the day of, and the day after chemotherapy   dexamethasone  (DECADRON ) 4 MG tablet Take 2 tabs by mouth 2 times daily starting day before  chemo. Then take 2 tabs daily for 2 days starting day after chemo. Take with food.   ELIQUIS  5 MG TABS tablet Take 1 tablet by mouth twice daily   ezetimibe  (ZETIA ) 10 MG tablet Take 1 tablet (10 mg total) by mouth daily.   fluticasone  (FLONASE ) 50 MCG/ACT nasal spray Place 1-2 sprays into both nostrils daily.   gabapentin  (NEURONTIN ) 300 MG capsule Take 1 capsule by mouth twice daily   KRILL OIL PO Take 1 tablet by mouth daily.   lisinopril  (ZESTRIL ) 10 MG tablet Take 1 tablet (10 mg total) by mouth daily.   MAGNESIUM PO Take 1 tablet by mouth daily.   methylPREDNISolone  (MEDROL  DOSEPAK) 4 MG TBPK tablet Take with signs of chronic sinusitis and take as directed   metoCLOPramide  (REGLAN ) 10 MG tablet Take 1 tablet (10 mg total) by mouth 4 (four) times daily -  before meals and at bedtime.   metoprolol  tartrate (LOPRESSOR ) 25 MG tablet Take 1 tablet (25 mg total) by mouth once for 1 dose. Please take this medication 2 hours before CT   morphine  (MS CONTIN ) 15 MG 12 hr tablet Take 1 tablet (15 mg total) by mouth every 8 (eight) hours.   Multiple Vitamins-Minerals (CENTRUM SILVER PO) Take 1 tablet by mouth daily.   Multiple Vitamins-Minerals (ZINC PO) Take 1 tablet by mouth daily.   Naphazoline-Pheniramine (OPCON-A) 0.027-0.315 % SOLN Place 1 drop into both eyes daily as needed (redness).   nitroGLYCERIN  (NITROSTAT ) 0.4 MG SL tablet Place 1 tablet (0.4 mg total) under the tongue every 5 (five) minutes as needed for chest pain.   omeprazole  (PRILOSEC) 20 MG capsule Take 1 capsule (20 mg total) by mouth daily.   ondansetron  (ZOFRAN ) 8 MG tablet Take 1 tablet (8 mg total) by mouth every 8 (eight) hours as needed for nausea or vomiting.   oxyCODONE  (OXY IR/ROXICODONE ) 5 MG immediate release tablet Take 1 tablet (5 mg total) by mouth every 4 (four) hours as needed for severe pain (pain score 7-10) or breakthrough pain.   potassium chloride  SA (KLOR-CON  M) 20 MEQ tablet Take 1 tablet (20 mEq total) by  mouth daily.   prochlorperazine  (COMPAZINE ) 10 MG tablet Take 1 tablet (10 mg total) by mouth every 6 (six) hours as needed for nausea or vomiting.   prochlorperazine  (COMPAZINE ) 10 MG tablet Take 1 tablet (10 mg total) by mouth every 6 (six) hours as needed.   prochlorperazine  (COMPAZINE ) 10 MG tablet Take 1 tablet (10 mg total) by mouth every 6 (six) hours as needed for nausea or vomiting.   rosuvastatin  (CRESTOR ) 5 MG tablet Take 1 tablet (5 mg total) by mouth at bedtime.   sucralfate  (CARAFATE ) 1 g tablet Take 1 tablet (1 g total) by mouth 4 (four) times daily -  with meals and at bedtime. Crush and dissolve in 10 mL's of warm water prior to swallowing, take 20 min prior to meals (Patient taking differently:  Take 1 g by mouth as needed. Crush and dissolve in 10 mL's of warm water prior to swallowing, take 20 min prior to meals)   triamcinolone  0.1%-Eucerin equivalent 1:1 cream mixture Apply topically 3 (three) times daily as needed.   TURMERIC PO Take 1 tablet by mouth 3 (three) times a week.   umeclidinium-vilanterol (ANORO ELLIPTA ) 62.5-25 MCG/ACT AEPB Inhale 1 puff into the lungs daily.   No facility-administered encounter medications on file as of 12/28/2023.     There were no vitals filed for this visit. There is no height or weight on file to calculate BMI.   ECOG PERFORMANCE STATUS: {CHL ONC ECOG PS:[id number]}  PHYSICAL EXAM GENERAL:alert, no distress and comfortable SKIN: no rash  EYES: sclera clear NECK: without mass LYMPH:  no palpable cervical or supraclavicular lymphadenopathy  LUNGS: clear with normal breathing effort HEART: regular rate & rhythm, no lower extremity edema ABDOMEN: abdomen soft, non-tender and normal bowel sounds NEURO: alert & oriented x 3 with fluent speech, no focal motor/sensory deficits Breast exam:  PAC without erythema    CBC    Latest Ref Rng & Units 12/20/2023    9:05 AM 12/14/2023    2:58 PM 12/07/2023    9:03 AM  CBC  WBC 4.0 - 10.5  K/uL 13.1  3.7  8.8   Hemoglobin 13.0 - 17.0 g/dL 88.6  9.7  89.7   Hematocrit 39.0 - 52.0 % 35.3  30.3  31.0   Platelets 150 - 400 K/uL 263  170  281       CMP     Latest Ref Rng & Units 12/20/2023    9:05 AM 12/14/2023    2:58 PM 12/07/2023    9:03 AM  CMP  Glucose 70 - 99 mg/dL 91  866  881   BUN 8 - 23 mg/dL 12  14  25    Creatinine 0.61 - 1.24 mg/dL 9.21  9.26  9.11   Sodium 135 - 145 mmol/L 141  141  135   Potassium 3.5 - 5.1 mmol/L 3.5  3.8  4.2   Chloride 98 - 111 mmol/L 104  106  99   CO2 22 - 32 mmol/L 28  31  27    Calcium  8.9 - 10.3 mg/dL 8.6  8.3  9.1   Total Protein 6.5 - 8.1 g/dL 6.7  6.3  7.1   Total Bilirubin 0.0 - 1.2 mg/dL 0.3  0.3  0.2   Alkaline Phos 38 - 126 U/L 88  58  59   AST 15 - 41 U/L 24  16  21    ALT 0 - 44 U/L 19  20  26        ASSESSMENT & PLAN:  PLAN:  No orders of the defined types were placed in this encounter.     All questions were answered. The patient knows to call the clinic with any problems, questions or concerns. No barriers to learning were detected. I spent *** counseling the patient face to face. The total time spent in the appointment was *** and more than 50% was on counseling, review of test results, and coordination of care.   Essance Gatti K Wandra Babin, NP 12/26/2023 1:34 PM

## 2023-12-27 ENCOUNTER — Other Ambulatory Visit: Payer: Self-pay | Admitting: Internal Medicine

## 2023-12-27 DIAGNOSIS — I2699 Other pulmonary embolism without acute cor pulmonale: Secondary | ICD-10-CM

## 2023-12-28 ENCOUNTER — Ambulatory Visit
Admission: RE | Admit: 2023-12-28 | Discharge: 2023-12-28 | Disposition: A | Discharge status: Home / Self Care | Source: Ambulatory Visit | Attending: Radiation Oncology | Admitting: Radiation Oncology

## 2023-12-28 ENCOUNTER — Inpatient Hospital Stay

## 2023-12-28 ENCOUNTER — Inpatient Hospital Stay (HOSPITAL_BASED_OUTPATIENT_CLINIC_OR_DEPARTMENT_OTHER): Admitting: Nurse Practitioner

## 2023-12-28 ENCOUNTER — Encounter: Payer: Self-pay | Admitting: Nurse Practitioner

## 2023-12-28 ENCOUNTER — Other Ambulatory Visit: Payer: Self-pay

## 2023-12-28 ENCOUNTER — Inpatient Hospital Stay: Admitting: Dietician

## 2023-12-28 ENCOUNTER — Inpatient Hospital Stay: Admitting: Nurse Practitioner

## 2023-12-28 VITALS — BP 120/62 | HR 78 | Temp 97.6°F | Resp 17 | Wt 128.6 lb

## 2023-12-28 DIAGNOSIS — C3492 Malignant neoplasm of unspecified part of left bronchus or lung: Secondary | ICD-10-CM

## 2023-12-28 DIAGNOSIS — G893 Neoplasm related pain (acute) (chronic): Secondary | ICD-10-CM

## 2023-12-28 DIAGNOSIS — Z515 Encounter for palliative care: Secondary | ICD-10-CM

## 2023-12-28 DIAGNOSIS — Z510 Encounter for antineoplastic radiation therapy: Secondary | ICD-10-CM | POA: Diagnosis not present

## 2023-12-28 DIAGNOSIS — R530 Neoplastic (malignant) related fatigue: Secondary | ICD-10-CM

## 2023-12-28 LAB — CBC WITH DIFFERENTIAL (CANCER CENTER ONLY)
Abs Immature Granulocytes: 0.19 K/uL — ABNORMAL HIGH (ref 0.00–0.07)
Basophils Absolute: 0 K/uL (ref 0.0–0.1)
Basophils Relative: 0 %
Eosinophils Absolute: 0 K/uL (ref 0.0–0.5)
Eosinophils Relative: 0 %
HCT: 32.6 % — ABNORMAL LOW (ref 39.0–52.0)
Hemoglobin: 10.2 g/dL — ABNORMAL LOW (ref 13.0–17.0)
Immature Granulocytes: 1 %
Lymphocytes Relative: 4 %
Lymphs Abs: 0.7 K/uL (ref 0.7–4.0)
MCH: 28.7 pg (ref 26.0–34.0)
MCHC: 31.3 g/dL (ref 30.0–36.0)
MCV: 91.8 fL (ref 80.0–100.0)
Monocytes Absolute: 0.5 K/uL (ref 0.1–1.0)
Monocytes Relative: 3 %
Neutro Abs: 15.5 K/uL — ABNORMAL HIGH (ref 1.7–7.7)
Neutrophils Relative %: 92 %
Platelet Count: 437 K/uL — ABNORMAL HIGH (ref 150–400)
RBC: 3.55 MIL/uL — ABNORMAL LOW (ref 4.22–5.81)
RDW: 16.3 % — ABNORMAL HIGH (ref 11.5–15.5)
WBC Count: 16.8 K/uL — ABNORMAL HIGH (ref 4.0–10.5)
nRBC: 0.1 % (ref 0.0–0.2)

## 2023-12-28 LAB — RAD ONC ARIA SESSION SUMMARY
Course Elapsed Days: 0
Plan Fractions Treated to Date: 1
Plan Fractions Treated to Date: 1
Plan Fractions Treated to Date: 1
Plan Prescribed Dose Per Fraction: 2.5 Gy
Plan Prescribed Dose Per Fraction: 3 Gy
Plan Prescribed Dose Per Fraction: 3 Gy
Plan Total Fractions Prescribed: 10
Plan Total Fractions Prescribed: 10
Plan Total Fractions Prescribed: 15
Plan Total Prescribed Dose: 30 Gy
Plan Total Prescribed Dose: 30 Gy
Plan Total Prescribed Dose: 37.5 Gy
Reference Point Dosage Given to Date: 2.5 Gy
Reference Point Dosage Given to Date: 3 Gy
Reference Point Dosage Given to Date: 3 Gy
Reference Point Session Dosage Given: 2.5 Gy
Reference Point Session Dosage Given: 3 Gy
Reference Point Session Dosage Given: 3 Gy
Session Number: 1

## 2023-12-28 LAB — CMP (CANCER CENTER ONLY)
ALT: 15 U/L (ref 0–44)
AST: 15 U/L (ref 15–41)
Albumin: 3.1 g/dL — ABNORMAL LOW (ref 3.5–5.0)
Alkaline Phosphatase: 69 U/L (ref 38–126)
Anion gap: 7 (ref 5–15)
BUN: 13 mg/dL (ref 8–23)
CO2: 26 mmol/L (ref 22–32)
Calcium: 8.4 mg/dL — ABNORMAL LOW (ref 8.9–10.3)
Chloride: 108 mmol/L (ref 98–111)
Creatinine: 0.81 mg/dL (ref 0.61–1.24)
GFR, Estimated: >60 mL/min (ref >60)
Glucose, Bld: 181 mg/dL — ABNORMAL HIGH (ref 70–99)
Potassium: 4.5 mmol/L (ref 3.5–5.1)
Sodium: 141 mmol/L (ref 135–145)
Total Bilirubin: 0.2 mg/dL (ref 0.0–1.2)
Total Protein: 6.5 g/dL (ref 6.5–8.1)

## 2023-12-28 LAB — SAMPLE TO BLOOD BANK

## 2023-12-28 MED ORDER — SODIUM CHLORIDE 0.9 % IV SOLN
75.0000 mg/m2 | Freq: Once | INTRAVENOUS | Status: AC
  Administered 2023-12-28: 120 mg via INTRAVENOUS
  Filled 2023-12-28: qty 12

## 2023-12-28 MED ORDER — SONAFINE EX EMUL
1.0000 | Freq: Once | CUTANEOUS | Status: AC
  Administered 2023-12-28: 1 via TOPICAL

## 2023-12-28 MED ORDER — DEXAMETHASONE SOD PHOSPHATE PF 10 MG/ML IJ SOLN
10.0000 mg | Freq: Once | INTRAMUSCULAR | Status: AC
  Administered 2023-12-28: 10 mg via INTRAVENOUS

## 2023-12-28 MED ORDER — DIPHENHYDRAMINE HCL 50 MG/ML IJ SOLN
50.0000 mg | Freq: Once | INTRAMUSCULAR | Status: AC
  Administered 2023-12-28: 50 mg via INTRAVENOUS
  Filled 2023-12-28: qty 1

## 2023-12-28 MED ORDER — ACETAMINOPHEN 325 MG PO TABS
650.0000 mg | ORAL_TABLET | Freq: Once | ORAL | Status: AC
  Administered 2023-12-28: 650 mg via ORAL
  Filled 2023-12-28: qty 2

## 2023-12-28 MED ORDER — DENOSUMAB 120 MG/1.7ML ~~LOC~~ SOLN
120.0000 mg | Freq: Once | SUBCUTANEOUS | Status: AC
  Administered 2023-12-28: 120 mg via SUBCUTANEOUS
  Filled 2023-12-28: qty 1.7

## 2023-12-28 MED ORDER — SODIUM CHLORIDE 0.9 % IV SOLN: INTRAVENOUS | Status: DC

## 2023-12-28 MED ORDER — SODIUM CHLORIDE 0.9 % IV SOLN
600.0000 mg | Freq: Once | INTRAVENOUS | Status: AC
  Administered 2023-12-28: 600 mg via INTRAVENOUS
  Filled 2023-12-28: qty 10

## 2023-12-28 NOTE — Progress Notes (Signed)
 Increase Cyramza dose to 600 mg today due to weight gain per Lacie, NP.  Harlene Nasuti, PharmD Oncology Infusion Pharmacist 12/28/2023 9:47 AM

## 2023-12-28 NOTE — Progress Notes (Signed)
 Palliative Medicine [facility]  Telephone:(336) [phone] Fax:(336) [phone]   Name: Albert Hardy. Date: 12/28/2023 MRN: [id number]  DOB: 03-Aug-1955  Patient Care Team: [name], MD as PCP - General (Family Medicine)    INTERVAL HISTORY: Albert Hardy. is a 68 y.o. 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 Palliative ask to see for symptom management and goals of care   SOCIAL HISTORY:     reports that he quit smoking about 12 years ago. His smoking use included cigarettes. He started smoking about 42 years ago. He has a 30 pack-year smoking history. He has never used smokeless tobacco. He reports current alcohol use. He reports current drug use. Frequency: 1.00 time per week. Drug: Marijuana.  ADVANCE DIRECTIVES:  None on file  CODE STATUS: Full code  PAST MEDICAL HISTORY: Past Medical History:  Diagnosis Date   Aortic regurgitation    Aortic stenosis 04/04/2018   Atherosclerosis of native arteries of the extremities with intermittent claudication 09/08/2013   Atherosclerosis of native artery of extremity with intermittent claudication 09/08/2013   IMO SNOMED Dx Update Oct 2024     Bilateral impacted cerumen 10/13/2018   Bilateral sensorineural hearing loss 12/14/2018   Cancer (HCC)    lung   Dyspnea    Encounter for antineoplastic chemotherapy 08/18/2022   Encounter for antineoplastic immunotherapy 08/18/2022   Former moderate cigarette smoker (10-19 per day) 05/20/2012   Quit smoking Nov 2013 when hospitalized w/ HTN and chest pain(diagnosed w/ valvular heart disease)   GERD (gastroesophageal reflux disease)    Heart murmur    History of radiation therapy    Lumbar Spine, Thoracic Spine- 06/25/22-07/10/22- Dr. Lynwood Nasuti   History of radiation therapy    Left lung-07/29/23-08/11/23- Dr. 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  MEDICATIONS:  Current Outpatient Medications  Medication Sig Dispense Refill   albuterol  (VENTOLIN  HFA) 108 (90 Base) MCG/ACT inhaler Inhale 2 puffs into the lungs every 6 (six) hours as needed for wheezing or shortness of breath. 8 g 6   Ascorbic Acid (VITAMIN C PO) Take 1 tablet by mouth daily.     azithromycin (ZITHROMAX Z-PAK) 250 MG tablet Take as directed 6 each 0   benzonatate  (TESSALON ) 100 MG capsule Take 1 capsule (100 mg total) by mouth 3 (three) times daily as needed. 30 capsule 2   Cholecalciferol (VITAMIN D3) 1000 UNITS CAPS Take 1,000 Units by mouth daily.     Coenzyme Q10 (CO Q 10 PO) Take 1 tablet by mouth daily.     Cyanocobalamin (VITAMIN B-12 PO) Take 1 tablet by mouth daily.     cyclobenzaprine (FLEXERIL) 5 MG tablet Take 1 tablet (5 mg total) by mouth 3 (three) times daily as needed for muscle spasms. 30 tablet 0   dexamethasone  (DECADRON ) 4 MG tablet Take 2 tablets TWICE a day the day before, the day of, and the day after chemotherapy 40 tablet 2   dexamethasone  (DECADRON ) 4 MG tablet Take 2 tabs by mouth 2 times daily starting day before chemo.  Then take 2 tabs daily for 2 days starting day after chemo. Take with food. 30 tablet 1   ELIQUIS  5 MG TABS tablet Take 1 tablet by mouth twice daily 60 tablet 0   ezetimibe  (ZETIA ) 10 MG tablet Take 1 tablet (10 mg total) by mouth daily. 90 tablet 1   fluticasone  (FLONASE ) 50 MCG/ACT nasal spray Place 1-2 sprays into both nostrils daily. 16 g 6   gabapentin  (NEURONTIN ) 300 MG capsule Take 1 capsule by mouth twice daily 60 capsule 0   KRILL OIL PO Take 1 tablet by mouth daily.     lisinopril  (ZESTRIL ) 10 MG tablet Take 1  tablet (10 mg total) by mouth daily. 90 tablet 3   MAGNESIUM PO Take 1 tablet by mouth daily.     methylPREDNISolone  (MEDROL  DOSEPAK) 4 MG TBPK tablet Take with signs of chronic sinusitis and take as directed 1 each 1   metoCLOPramide  (REGLAN ) 10 MG tablet Take 1 tablet (10 mg total) by mouth 4 (four) times daily -  before meals and at bedtime. 56 tablet 0   metoprolol  tartrate (LOPRESSOR ) 25 MG tablet Take 1 tablet (25 mg total) by mouth once for 1 dose. Please take this medication 2 hours before CT 1 tablet 0   morphine  (MS CONTIN ) 15 MG 12 hr tablet Take 1 tablet (15 mg total) by mouth every 8 (eight) hours. 90 tablet 0   Multiple Vitamins-Minerals (CENTRUM SILVER PO) Take 1 tablet by mouth daily.     Multiple Vitamins-Minerals (ZINC PO) Take 1 tablet by mouth daily.     Naphazoline-Pheniramine (OPCON-A) 0.027-0.315 % SOLN Place 1 drop into both eyes daily as needed (redness).     nitroGLYCERIN  (NITROSTAT ) 0.4 MG SL tablet Place 1 tablet (0.4 mg total) under the tongue every 5 (five) minutes as needed for chest pain. 25 tablet 1   omeprazole  (PRILOSEC) 20 MG capsule Take 1 capsule (20 mg total) by mouth daily. 90 capsule 1   ondansetron  (ZOFRAN ) 8 MG tablet Take 1 tablet (8 mg total) by mouth every 8 (eight) hours as needed for nausea or vomiting. 30 tablet 1   oxyCODONE  (OXY IR/ROXICODONE ) 5 MG immediate release tablet Take 1 tablet (5 mg total) by mouth every 4 (four) hours as needed for severe pain (pain score 7-10) or breakthrough pain. 90 tablet 0   potassium chloride  SA (KLOR-CON  M) 20 MEQ tablet Take 1 tablet (20 mEq total) by mouth daily. 6 tablet 0   prochlorperazine  (COMPAZINE ) 10 MG tablet Take 1 tablet (10 mg total) by mouth every 6 (six) hours as needed for nausea or vomiting. 30 tablet 0   prochlorperazine  (COMPAZINE ) 10 MG tablet Take 1 tablet (10 mg total) by mouth every 6 (six) hours as needed. 30 tablet 2   prochlorperazine  (COMPAZINE ) 10 MG tablet Take 1 tablet (10 mg total) by  mouth every 6 (six) hours as needed for nausea or vomiting. 30 tablet 1   rosuvastatin  (CRESTOR ) 5 MG tablet Take 1 tablet (5 mg total) by mouth at bedtime. 90 tablet 1   sucralfate  (CARAFATE ) 1 g tablet Take 1 tablet (1 g total) by mouth 4 (four) times daily -  with meals and at bedtime. Crush and dissolve in 10 mL's of warm water prior to swallowing, take 20 min prior to meals (Patient taking differently: Take 1 g by mouth as needed. Crush and dissolve in 10 mL's of warm water prior to swallowing, take 20 min prior to meals) 60 tablet 1  triamcinolone  0.1%-Eucerin equivalent 1:1 cream mixture Apply topically 3 (three) times daily as needed. 480 g 2   TURMERIC PO Take 1 tablet by mouth 3 (three) times a week.     umeclidinium-vilanterol (ANORO ELLIPTA ) 62.5-25 MCG/ACT AEPB Inhale 1 puff into the lungs daily. 180 each 3   No current facility-administered medications for this visit.    VITAL SIGNS: There were no vitals taken for this visit. There were no vitals filed for this visit.  Estimated body mass index is 18.99 kg/m as calculated from the following:   Height as of 12/13/23: 5' 9 (1.753 m).   Weight as of an earlier encounter on 12/28/23: 128 lb 9.6 oz (58.3 kg).     Latest Ref Rng & Units 12/28/2023    8:02 AM 12/20/2023    9:05 AM 12/14/2023    2:58 PM  CBC  WBC 4.0 - 10.5 K/uL 16.8  13.1  3.7   Hemoglobin 13.0 - 17.0 g/dL 89.7  88.6  9.7   Hematocrit 39.0 - 52.0 % 32.6  35.3  30.3   Platelets 150 - 400 K/uL 437  263  170        Latest Ref Rng & Units 12/28/2023    8:02 AM 12/20/2023    9:05 AM 12/14/2023    2:58 PM  CMP  Glucose 70 - 99 mg/dL 818  91  866   BUN 8 - 23 mg/dL 13  12  14    Creatinine 0.61 - 1.24 mg/dL 9.18  9.21  9.26   Sodium 135 - 145 mmol/L 141  141  141   Potassium 3.5 - 5.1 mmol/L 4.5  3.5  3.8   Chloride 98 - 111 mmol/L 108  104  106   CO2 22 - 32 mmol/L 26  28  31    Calcium  8.9 - 10.3 mg/dL 8.4  8.6  8.3   Total Protein 6.5 - 8.1 g/dL 6.5  6.7   6.3   Total Bilirubin 0.0 - 1.2 mg/dL 0.2  0.3  0.3   Alkaline Phos 38 - 126 U/L 69  88  58   AST 15 - 41 U/L 15  24  16    ALT 0 - 44 U/L 15  19  20      PERFORMANCE STATUS (ECOG) : 1 - Symptomatic but completely ambulatory   Physical Exam General: NAD Cardiovascular: RRR Pulmonary: normal breathing pattern  Extremities: no edema, no joint deformities Skin: no rashes Neurological: AAO x4  IMPRESSION: Discussed the use of AI scribe software for clinical note transcription with the patient, who gave verbal consent to proceed.  History of Present Illness Albert Hardy. is a 68 year old male who was seen during infusion for symptom management follow-up. Denies nausea, vomiting, constipation, or diarrhea. Appetite fluctuates. Some days are better than others. Weight is up to 128lbs. He continues to take things one day at a time. Currently undergoing radiation.   Albert Hardy reports his pain overall has been well controlled on current regimen. He is currently taking MS Contin  15mg  every 12 hours. During episodes of severe pain. He manages the pain with Tylenol  Arthritis and has started taking Oxycodone  every 4 hours as needed, which significantly alleviates the pain.  We will continue to closely follow and support. All questions answered and support provided.  Goals of Care  06/18/22-  We discussed his current illness and what it means in the larger context of hsi on-going co-morbidities. Natural disease trajectory and expectations were  discussed.  Albert Hardy and his wife are realistic in their expectations and understanding of his incurable cancer. He knows all treatment is palliative focused. Wishes to continue taking things one day at a time focusing on his quality of life allowing him every opportunity to continue to thrive.   We discussed Her current illness and what it means in the larger context of Her on-going co-morbidities. Natural disease trajectory and expectations were  discussed.  I discussed the importance of continued conversation with family and their medical providers regarding overall plan of care and treatment options, ensuring decisions are within the context of the patients values and GOCs. Assessment & Plan Appetite fluctuations and weight loss Intermittent appetite fluctuations with associated weight loss.  Weight is up to 128 pounds.  Appetite is influenced by stomach discomfort and food aversions. Encouraged by weight gain but experiences frustration with fluctuations. - Encourage consumption of small, frequent meals - Reinforce the importance of maintaining nutritional intake  Constipation Constipation remains an ongoing issue, described as an 'adventure' and 'rough at times'. The condition is more steady but still challenging. - Continue daily stool softener as needed for constipation.  Chronic cancer related pain management Pain management is well-controlled with current medications. - Continue MS Contin  15 mg every 12 hours. - Continue oxycodone  5 mg every 4 hours as needed. -Tylenol  every 8 hours as needed   I will plan to follow-up with patient in 4-6 weeks. Sooner if needed.  Patient expressed understanding and was in agreement with this plan. He also understands that He can call the clinic at any time with any questions, concerns, or complaints.   Any controlled substances utilized were prescribed in the context of palliative care. PDMP has been reviewed.   Visit consisted of counseling and education dealing with the complex and emotionally intense issues of symptom management and palliative care in the setting of serious and potentially life-threatening illness.  Levon Borer, AGPCNP-BC  Palliative Medicine Team/[facility]

## 2023-12-28 NOTE — Progress Notes (Signed)
 Nutrition Follow-up:  Pt with stage IV SCC of left lung to bone. He is currently receiving docetaxel + ramucirumab q21d. Patient is under the care of Dr. Sherrod.   Met with patient in infusion. Feeling tired today, but doing well overall. Patient reports appetite has improved and has been eating well. He continues to drink one premier protein. Patient is pleased with weight trends. Says he is hoping to get above 130 lb. He denies NIS. Pain well controlled with scheduled pain medication.   Medications: reviewed   Labs: glucose 181, Hgb 10.2, albumin 3.1  Anthropometrics: Wt 128 lb 9.6 oz today - increased   11/4 - 125 lb 14.4 oz  10/22 - 116 lb 6.4 oz    NUTRITION DIAGNOSIS: Unintended wt loss - improved    INTERVENTION:  Continue strategies for increasing calories and protein Continue daily premier protein    MONITORING, EVALUATION, GOAL: wt trends, intake   NEXT VISIT: Tuesday December 30 during infusion

## 2023-12-28 NOTE — Patient Instructions (Signed)
 [facility] - A DEPT OF [facility]  Discharge Instructions: Thank you for choosing [facility] to provide your oncology and hematology care.   If you have a lab appointment with the Cancer Center, please go directly to the Cancer Center and check in at the registration area.   Wear comfortable clothing and clothing appropriate for easy access to any Portacath or PICC line.   We strive to give you quality time with your provider. You may need to reschedule your appointment if you arrive late (15 or more minutes).  Arriving late affects you and other patients whose appointments are after yours.  Also, if you miss three or more appointments without notifying the office, you may be dismissed from the clinic at the provider's discretion.      For prescription refill requests, have your pharmacy contact our office and allow 72 hours for refills to be completed.    Today you received the following chemotherapy and/or immunotherapy agents: Ramucirumab (Cyramza) & Docetaxel (Taxotere)    To help prevent nausea and vomiting after your treatment, we encourage you to take your nausea medication as directed.  BELOW ARE SYMPTOMS THAT SHOULD BE REPORTED IMMEDIATELY: *FEVER GREATER THAN 100.4 F (38 C) OR HIGHER *CHILLS OR SWEATING *NAUSEA AND VOMITING THAT IS NOT CONTROLLED WITH YOUR NAUSEA MEDICATION *UNUSUAL SHORTNESS OF BREATH *UNUSUAL BRUISING OR BLEEDING *URINARY PROBLEMS (pain or burning when urinating, or frequent urination) *BOWEL PROBLEMS (unusual diarrhea, constipation, pain near the anus) TENDERNESS IN MOUTH AND THROAT WITH OR WITHOUT PRESENCE OF ULCERS (sore throat, sores in mouth, or a toothache) UNUSUAL RASH, SWELLING OR PAIN  UNUSUAL VAGINAL DISCHARGE OR ITCHING   Items with * indicate a potential emergency and should be followed up as soon as possible or go to the Emergency Department if any problems should occur.  Please show the  CHEMOTHERAPY ALERT CARD or IMMUNOTHERAPY ALERT CARD at check-in to the Emergency Department and triage nurse.  Should you have questions after your visit or need to cancel or reschedule your appointment, please contact [facility] - A DEPT OF [facility]  Dept: [phone]  and follow the prompts.  Office hours are 8:00 a.m. to 4:30 p.m. Monday - Friday. Please note that voicemails left after 4:00 p.m. may not be returned until the following business day.  We are closed weekends and major holidays. You have access to a nurse at all times for urgent questions. Please call the main number to the clinic Dept: [phone] and follow the prompts.   For any non-urgent questions, you may also contact your provider using MyChart. We now offer e-Visits for anyone 79 and older to request care online for non-urgent symptoms. For details visit [email or web address].   Also download the MyChart app! Go to the app store, search MyChart, open the app, select [place], and log in with your MyChart username and password.

## 2023-12-29 ENCOUNTER — Ambulatory Visit
Admission: RE | Admit: 2023-12-29 | Discharge: 2023-12-29 | Disposition: A | Discharge status: Home / Self Care | Source: Ambulatory Visit | Attending: Radiation Oncology | Admitting: Radiation Oncology

## 2023-12-29 ENCOUNTER — Other Ambulatory Visit: Payer: Self-pay

## 2023-12-29 DIAGNOSIS — Z510 Encounter for antineoplastic radiation therapy: Secondary | ICD-10-CM | POA: Diagnosis not present

## 2023-12-29 LAB — RAD ONC ARIA SESSION SUMMARY
Course Elapsed Days: 1
Plan Fractions Treated to Date: 2
Plan Fractions Treated to Date: 2
Plan Fractions Treated to Date: 2
Plan Prescribed Dose Per Fraction: 2.5 Gy
Plan Prescribed Dose Per Fraction: 3 Gy
Plan Prescribed Dose Per Fraction: 3 Gy
Plan Total Fractions Prescribed: 10
Plan Total Fractions Prescribed: 10
Plan Total Fractions Prescribed: 15
Plan Total Prescribed Dose: 30 Gy
Plan Total Prescribed Dose: 30 Gy
Plan Total Prescribed Dose: 37.5 Gy
Reference Point Dosage Given to Date: 5 Gy
Reference Point Dosage Given to Date: 6 Gy
Reference Point Dosage Given to Date: 6 Gy
Reference Point Session Dosage Given: 2.5 Gy
Reference Point Session Dosage Given: 3 Gy
Reference Point Session Dosage Given: 3 Gy
Session Number: 2

## 2023-12-30 ENCOUNTER — Other Ambulatory Visit: Payer: Self-pay

## 2023-12-30 ENCOUNTER — Inpatient Hospital Stay

## 2023-12-30 ENCOUNTER — Ambulatory Visit
Admission: RE | Admit: 2023-12-30 | Discharge: 2023-12-30 | Disposition: A | Discharge status: Home / Self Care | Source: Ambulatory Visit | Attending: Radiation Oncology | Admitting: Radiation Oncology

## 2023-12-30 ENCOUNTER — Other Ambulatory Visit: Payer: Self-pay | Admitting: Family Medicine

## 2023-12-30 VITALS — BP 104/56 | HR 69 | Temp 98.1°F | Resp 16

## 2023-12-30 DIAGNOSIS — I10 Essential (primary) hypertension: Secondary | ICD-10-CM

## 2023-12-30 DIAGNOSIS — C3492 Malignant neoplasm of unspecified part of left bronchus or lung: Secondary | ICD-10-CM

## 2023-12-30 DIAGNOSIS — Z510 Encounter for antineoplastic radiation therapy: Secondary | ICD-10-CM | POA: Diagnosis not present

## 2023-12-30 LAB — RAD ONC ARIA SESSION SUMMARY
Course Elapsed Days: 2
Plan Fractions Treated to Date: 3
Plan Fractions Treated to Date: 3
Plan Fractions Treated to Date: 3
Plan Prescribed Dose Per Fraction: 2.5 Gy
Plan Prescribed Dose Per Fraction: 3 Gy
Plan Prescribed Dose Per Fraction: 3 Gy
Plan Total Fractions Prescribed: 10
Plan Total Fractions Prescribed: 10
Plan Total Fractions Prescribed: 15
Plan Total Prescribed Dose: 30 Gy
Plan Total Prescribed Dose: 30 Gy
Plan Total Prescribed Dose: 37.5 Gy
Reference Point Dosage Given to Date: 7.5 Gy
Reference Point Dosage Given to Date: 9 Gy
Reference Point Dosage Given to Date: 9 Gy
Reference Point Session Dosage Given: 2.5 Gy
Reference Point Session Dosage Given: 3 Gy
Reference Point Session Dosage Given: 3 Gy
Session Number: 3

## 2023-12-30 MED ORDER — PEGFILGRASTIM-FPGK 6 MG/0.6ML ~~LOC~~ SOSY
6.0000 mg | PREFILLED_SYRINGE | Freq: Once | SUBCUTANEOUS | Status: AC
  Administered 2023-12-30: 6 mg via SUBCUTANEOUS
  Filled 2023-12-30: qty 0.6

## 2023-12-30 NOTE — Telephone Encounter (Signed)
 Hydrochlorothiazide  part of the lisionpril was d/c on 12/06/23  Well-controlled, I suspect he can get by with lower doses of meds. will stop HCTZ at this time, and just continue lisinopril .    I have refused refill due to Dr. Levora last note.

## 2023-12-30 NOTE — Telephone Encounter (Signed)
 Correct. Thank you for looking at the last note and plan. Should just be on lisinopril .

## 2023-12-30 NOTE — Telephone Encounter (Signed)
 Please advise on refill.

## 2023-12-31 ENCOUNTER — Encounter: Payer: Self-pay | Admitting: Family Medicine

## 2023-12-31 ENCOUNTER — Other Ambulatory Visit: Payer: Self-pay | Admitting: Nurse Practitioner

## 2023-12-31 ENCOUNTER — Other Ambulatory Visit: Payer: Self-pay

## 2023-12-31 ENCOUNTER — Ambulatory Visit
Admission: RE | Admit: 2023-12-31 | Discharge: 2023-12-31 | Disposition: A | Discharge status: Home / Self Care | Source: Ambulatory Visit | Attending: Radiation Oncology | Admitting: Radiation Oncology

## 2023-12-31 ENCOUNTER — Other Ambulatory Visit: Payer: Self-pay | Admitting: Internal Medicine

## 2023-12-31 DIAGNOSIS — I2699 Other pulmonary embolism without acute cor pulmonale: Secondary | ICD-10-CM

## 2023-12-31 DIAGNOSIS — G893 Neoplasm related pain (acute) (chronic): Secondary | ICD-10-CM

## 2023-12-31 DIAGNOSIS — Z515 Encounter for palliative care: Secondary | ICD-10-CM

## 2023-12-31 DIAGNOSIS — C3492 Malignant neoplasm of unspecified part of left bronchus or lung: Secondary | ICD-10-CM

## 2023-12-31 DIAGNOSIS — Z510 Encounter for antineoplastic radiation therapy: Secondary | ICD-10-CM | POA: Diagnosis not present

## 2023-12-31 LAB — RAD ONC ARIA SESSION SUMMARY
Course Elapsed Days: 3
Plan Fractions Treated to Date: 4
Plan Fractions Treated to Date: 4
Plan Fractions Treated to Date: 4
Plan Prescribed Dose Per Fraction: 2.5 Gy
Plan Prescribed Dose Per Fraction: 3 Gy
Plan Prescribed Dose Per Fraction: 3 Gy
Plan Total Fractions Prescribed: 10
Plan Total Fractions Prescribed: 10
Plan Total Fractions Prescribed: 15
Plan Total Prescribed Dose: 30 Gy
Plan Total Prescribed Dose: 30 Gy
Plan Total Prescribed Dose: 37.5 Gy
Reference Point Dosage Given to Date: 10 Gy
Reference Point Dosage Given to Date: 12 Gy
Reference Point Dosage Given to Date: 12 Gy
Reference Point Session Dosage Given: 2.5 Gy
Reference Point Session Dosage Given: 3 Gy
Reference Point Session Dosage Given: 3 Gy
Session Number: 4

## 2023-12-31 MED ORDER — MORPHINE SULFATE ER 15 MG PO TBCR: 15.0000 mg | EXTENDED_RELEASE_TABLET | Freq: Three times a day (TID) | ORAL | 0 refills | Status: DC

## 2023-12-31 MED ORDER — OXYCODONE HCL 5 MG PO TABS: 5.0000 mg | ORAL_TABLET | ORAL | 0 refills | Status: DC | PRN

## 2024-01-03 ENCOUNTER — Other Ambulatory Visit: Payer: Self-pay

## 2024-01-03 ENCOUNTER — Ambulatory Visit
Admission: RE | Admit: 2024-01-03 | Discharge: 2024-01-03 | Disposition: A | Discharge status: Home / Self Care | Source: Ambulatory Visit | Attending: Radiation Oncology | Admitting: Radiation Oncology

## 2024-01-03 DIAGNOSIS — Z510 Encounter for antineoplastic radiation therapy: Secondary | ICD-10-CM | POA: Diagnosis not present

## 2024-01-03 LAB — RAD ONC ARIA SESSION SUMMARY
Course Elapsed Days: 6
Plan Fractions Treated to Date: 5
Plan Fractions Treated to Date: 5
Plan Fractions Treated to Date: 5
Plan Prescribed Dose Per Fraction: 2.5 Gy
Plan Prescribed Dose Per Fraction: 3 Gy
Plan Prescribed Dose Per Fraction: 3 Gy
Plan Total Fractions Prescribed: 10
Plan Total Fractions Prescribed: 10
Plan Total Fractions Prescribed: 15
Plan Total Prescribed Dose: 30 Gy
Plan Total Prescribed Dose: 30 Gy
Plan Total Prescribed Dose: 37.5 Gy
Reference Point Dosage Given to Date: 12.5 Gy
Reference Point Dosage Given to Date: 15 Gy
Reference Point Dosage Given to Date: 15 Gy
Reference Point Session Dosage Given: 2.5 Gy
Reference Point Session Dosage Given: 3 Gy
Reference Point Session Dosage Given: 3 Gy
Session Number: 5

## 2024-01-04 ENCOUNTER — Ambulatory Visit

## 2024-01-04 ENCOUNTER — Other Ambulatory Visit: Payer: Self-pay

## 2024-01-04 ENCOUNTER — Ambulatory Visit
Admission: RE | Admit: 2024-01-04 | Discharge: 2024-01-04 | Disposition: A | Discharge status: Home / Self Care | Source: Ambulatory Visit | Attending: Radiation Oncology | Admitting: Radiation Oncology

## 2024-01-04 DIAGNOSIS — Z510 Encounter for antineoplastic radiation therapy: Secondary | ICD-10-CM | POA: Diagnosis not present

## 2024-01-04 LAB — RAD ONC ARIA SESSION SUMMARY
Course Elapsed Days: 7
Plan Fractions Treated to Date: 6
Plan Fractions Treated to Date: 6
Plan Fractions Treated to Date: 6
Plan Prescribed Dose Per Fraction: 2.5 Gy
Plan Prescribed Dose Per Fraction: 3 Gy
Plan Prescribed Dose Per Fraction: 3 Gy
Plan Total Fractions Prescribed: 10
Plan Total Fractions Prescribed: 10
Plan Total Fractions Prescribed: 15
Plan Total Prescribed Dose: 30 Gy
Plan Total Prescribed Dose: 30 Gy
Plan Total Prescribed Dose: 37.5 Gy
Reference Point Dosage Given to Date: 15 Gy
Reference Point Dosage Given to Date: 18 Gy
Reference Point Dosage Given to Date: 18 Gy
Reference Point Session Dosage Given: 2.5 Gy
Reference Point Session Dosage Given: 3 Gy
Reference Point Session Dosage Given: 3 Gy
Session Number: 6

## 2024-01-05 ENCOUNTER — Other Ambulatory Visit: Payer: Self-pay

## 2024-01-05 ENCOUNTER — Ambulatory Visit
Admission: RE | Admit: 2024-01-05 | Discharge: 2024-01-05 | Disposition: A | Discharge status: Home / Self Care | Source: Ambulatory Visit | Attending: Radiation Oncology | Admitting: Radiation Oncology

## 2024-01-05 DIAGNOSIS — Z510 Encounter for antineoplastic radiation therapy: Secondary | ICD-10-CM | POA: Diagnosis not present

## 2024-01-05 LAB — RAD ONC ARIA SESSION SUMMARY
Course Elapsed Days: 8
Plan Fractions Treated to Date: 7
Plan Fractions Treated to Date: 7
Plan Fractions Treated to Date: 7
Plan Prescribed Dose Per Fraction: 2.5 Gy
Plan Prescribed Dose Per Fraction: 3 Gy
Plan Prescribed Dose Per Fraction: 3 Gy
Plan Total Fractions Prescribed: 10
Plan Total Fractions Prescribed: 10
Plan Total Fractions Prescribed: 15
Plan Total Prescribed Dose: 30 Gy
Plan Total Prescribed Dose: 30 Gy
Plan Total Prescribed Dose: 37.5 Gy
Reference Point Dosage Given to Date: 17.5 Gy
Reference Point Dosage Given to Date: 21 Gy
Reference Point Dosage Given to Date: 21 Gy
Reference Point Session Dosage Given: 2.5 Gy
Reference Point Session Dosage Given: 3 Gy
Reference Point Session Dosage Given: 3 Gy
Session Number: 7

## 2024-01-10 ENCOUNTER — Other Ambulatory Visit: Payer: Self-pay

## 2024-01-10 ENCOUNTER — Ambulatory Visit
Admission: RE | Admit: 2024-01-10 | Discharge: 2024-01-10 | Disposition: A | Source: Ambulatory Visit | Attending: Radiation Oncology | Admitting: Radiation Oncology

## 2024-01-10 DIAGNOSIS — C3412 Malignant neoplasm of upper lobe, left bronchus or lung: Secondary | ICD-10-CM | POA: Insufficient documentation

## 2024-01-10 DIAGNOSIS — Z510 Encounter for antineoplastic radiation therapy: Secondary | ICD-10-CM | POA: Insufficient documentation

## 2024-01-10 DIAGNOSIS — Z86711 Personal history of pulmonary embolism: Secondary | ICD-10-CM | POA: Insufficient documentation

## 2024-01-10 DIAGNOSIS — Z5111 Encounter for antineoplastic chemotherapy: Secondary | ICD-10-CM | POA: Diagnosis not present

## 2024-01-10 DIAGNOSIS — C7951 Secondary malignant neoplasm of bone: Secondary | ICD-10-CM | POA: Diagnosis present

## 2024-01-10 DIAGNOSIS — G893 Neoplasm related pain (acute) (chronic): Secondary | ICD-10-CM | POA: Diagnosis not present

## 2024-01-10 DIAGNOSIS — Z7901 Long term (current) use of anticoagulants: Secondary | ICD-10-CM | POA: Diagnosis not present

## 2024-01-10 DIAGNOSIS — Z79620 Long term (current) use of immunosuppressive biologic: Secondary | ICD-10-CM | POA: Insufficient documentation

## 2024-01-10 DIAGNOSIS — Z87891 Personal history of nicotine dependence: Secondary | ICD-10-CM | POA: Diagnosis not present

## 2024-01-10 LAB — RAD ONC ARIA SESSION SUMMARY
Course Elapsed Days: 13
Plan Fractions Treated to Date: 8
Plan Fractions Treated to Date: 8
Plan Fractions Treated to Date: 8
Plan Prescribed Dose Per Fraction: 2.5 Gy
Plan Prescribed Dose Per Fraction: 3 Gy
Plan Prescribed Dose Per Fraction: 3 Gy
Plan Total Fractions Prescribed: 10
Plan Total Fractions Prescribed: 10
Plan Total Fractions Prescribed: 15
Plan Total Prescribed Dose: 30 Gy
Plan Total Prescribed Dose: 30 Gy
Plan Total Prescribed Dose: 37.5 Gy
Reference Point Dosage Given to Date: 20 Gy
Reference Point Dosage Given to Date: 24 Gy
Reference Point Dosage Given to Date: 24 Gy
Reference Point Session Dosage Given: 2.5 Gy
Reference Point Session Dosage Given: 3 Gy
Reference Point Session Dosage Given: 3 Gy
Session Number: 8

## 2024-01-11 ENCOUNTER — Ambulatory Visit
Admission: RE | Admit: 2024-01-11 | Discharge: 2024-01-11 | Disposition: A | Source: Ambulatory Visit | Attending: Radiation Oncology | Admitting: Radiation Oncology

## 2024-01-11 ENCOUNTER — Other Ambulatory Visit: Payer: Self-pay

## 2024-01-11 DIAGNOSIS — Z510 Encounter for antineoplastic radiation therapy: Secondary | ICD-10-CM | POA: Diagnosis not present

## 2024-01-11 LAB — RAD ONC ARIA SESSION SUMMARY
Course Elapsed Days: 14
Plan Fractions Treated to Date: 9
Plan Fractions Treated to Date: 9
Plan Fractions Treated to Date: 9
Plan Prescribed Dose Per Fraction: 2.5 Gy
Plan Prescribed Dose Per Fraction: 3 Gy
Plan Prescribed Dose Per Fraction: 3 Gy
Plan Total Fractions Prescribed: 10
Plan Total Fractions Prescribed: 10
Plan Total Fractions Prescribed: 15
Plan Total Prescribed Dose: 30 Gy
Plan Total Prescribed Dose: 30 Gy
Plan Total Prescribed Dose: 37.5 Gy
Reference Point Dosage Given to Date: 22.5 Gy
Reference Point Dosage Given to Date: 27 Gy
Reference Point Dosage Given to Date: 27 Gy
Reference Point Session Dosage Given: 2.5 Gy
Reference Point Session Dosage Given: 3 Gy
Reference Point Session Dosage Given: 3 Gy
Session Number: 9

## 2024-01-12 ENCOUNTER — Ambulatory Visit
Admission: RE | Admit: 2024-01-12 | Discharge: 2024-01-12 | Disposition: A | Source: Ambulatory Visit | Attending: Radiation Oncology | Admitting: Radiation Oncology

## 2024-01-12 ENCOUNTER — Other Ambulatory Visit: Payer: Self-pay

## 2024-01-12 DIAGNOSIS — Z510 Encounter for antineoplastic radiation therapy: Secondary | ICD-10-CM | POA: Diagnosis not present

## 2024-01-12 LAB — RAD ONC ARIA SESSION SUMMARY
Course Elapsed Days: 15
Plan Fractions Treated to Date: 10
Plan Fractions Treated to Date: 10
Plan Fractions Treated to Date: 10
Plan Prescribed Dose Per Fraction: 2.5 Gy
Plan Prescribed Dose Per Fraction: 3 Gy
Plan Prescribed Dose Per Fraction: 3 Gy
Plan Total Fractions Prescribed: 10
Plan Total Fractions Prescribed: 10
Plan Total Fractions Prescribed: 15
Plan Total Prescribed Dose: 30 Gy
Plan Total Prescribed Dose: 30 Gy
Plan Total Prescribed Dose: 37.5 Gy
Reference Point Dosage Given to Date: 25 Gy
Reference Point Dosage Given to Date: 30 Gy
Reference Point Dosage Given to Date: 30 Gy
Reference Point Session Dosage Given: 2.5 Gy
Reference Point Session Dosage Given: 3 Gy
Reference Point Session Dosage Given: 3 Gy
Session Number: 10

## 2024-01-12 NOTE — Progress Notes (Unsigned)
 [facility] OFFICE PROGRESS NOTE  Albert Reyes [name], MD [phone] [place] [address]  DIAGNOSIS: Stage IV (T2a, N2, M1 B) non-small cell lung cancer, squamous cell carcinoma presented with left upper lobe lung mass in addition to AP window lymphadenopathy and suspicious bone metastasis to the T8 and L4 vertebrae in addition to retroperitoneal metastatic soft tissue nodule diagnosed in April 2024.    Molecular studies by [email or web address] showed no actionable mutations and PD-L1 expression of 70%.  PRIOR THERAPY: 1) Palliative radiotherapy to the T8 and L4 metastatic bone disease. 2) Systemic chemotherapy with carboplatin  for AUC of 5, paclitaxel  175 Mg/M2 and Libtayo  (Cempilimab) 350 Mg IV every 3 weeks with Neulasta  support for 4 cycles  3) Palliative radiation to the left hilar mass under the care of Dr. Shannon. Last dose on 08/11/23.  4)  Maintenance treatment with single agent Libtayo  (Cempilimab) 350 Mg IV every 3 weeks.  First dose September 08, 2022.  Status post 20 cycles.   CURRENT THERAPY: 1) docetaxel  and Cyramza  IV every 3 weeks.  First on 12/08/23. He is status post 2 cycles of treatment.  2) Xgeva  every 6 weeks 3) Palliative radiation to the left paraspinal mass and right gluteal subcutaneous nodule under the care of Dr. Shannon. Last day scheduled for 01/19/24.   INTERVAL HISTORY: Albert Hardy. 68 y.o. male returns to the clinic today for a follow up visit. The patient was last seen in the clinc 3 weeks ago by my colleague. The patient is currently on chemotherapy with docetaxel  and Cyramza .  He is status post 2 cycles of treatment and seems to be tolerating it well.  His weight and appetite is picking up. He gained 3 lbs. His shortness of breath and cough are stable to marginally worse. He has some anemia. He denies significant bleeding except if he blows his nose he may have blood tinged mucus. His cough comes and goes and he states it is not to the point  where her needs to take any medication for it. He denies any chest pain or hemoptysis.  Denies any fever, chills, or night sweats.  He is receiving palliative radiation to the right gluteal subcutaneous nodule and left paraspinal mass. His last day of radiation is tomorrow. He does report getting cold easily but states this is about the same as prior. He denies fever or chills.   Constipation is well controlled with Senokot, having previously increased the dose to four tablets for two days, which resolved symptoms; he has since returned to one tablet daily.  He denies peripheral neuropathy but reports some what sounds like is carpal tunnel.   He takes Senokot for constipation.  Denies any nausea, vomiting, diarrhea, rashes, abnormal bleeding or bruising. He is scheduled to see palliative care today. He is here today for evaluation and repeat blood work before undergoing cycle #3.     MEDICAL HISTORY: Past Medical History:  Diagnosis Date   Aortic regurgitation    Aortic stenosis 04/04/2018   Atherosclerosis of native arteries of the extremities with intermittent claudication 09/08/2013   Atherosclerosis of native artery of extremity with intermittent claudication 09/08/2013   IMO SNOMED Dx Update Oct 2024     Bilateral impacted cerumen 10/13/2018   Bilateral sensorineural hearing loss 12/14/2018   Cancer (HCC)    lung   Dyspnea    Encounter for antineoplastic chemotherapy 08/18/2022   Encounter for antineoplastic immunotherapy 08/18/2022   Former moderate cigarette smoker (10-19  per day) 05/20/2012   Quit smoking Nov 2013 when hospitalized w/ HTN and chest pain(diagnosed w/ valvular heart disease)   GERD (gastroesophageal reflux disease)    Heart murmur    History of radiation therapy    Lumbar Spine, Thoracic Spine- 06/25/22-07/10/22- Dr. Lynwood Nasuti   History of radiation therapy    Left lung-07/29/23-08/11/23- Dr. 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  MEDICATIONS:  Current Outpatient Medications  Medication Sig Dispense Refill   albuterol  (VENTOLIN  HFA) 108 (90 Base) MCG/ACT inhaler Inhale 2 puffs into the lungs every 6 (six) hours as needed for wheezing or shortness of breath. 8 g 6   Ascorbic Acid (VITAMIN C PO) Take 1 tablet by mouth daily.     azithromycin  (ZITHROMAX  Z-PAK) 250 MG tablet Take as directed 6 each 0   benzonatate  (TESSALON ) 100 MG capsule Take 1 capsule (100 mg total) by mouth 3 (three) times daily as needed. 30 capsule 2   Cholecalciferol (VITAMIN D3) 1000 UNITS CAPS Take 1,000 Units by mouth daily.     Coenzyme Q10 (CO Q 10 PO) Take 1 tablet by mouth daily.     Cyanocobalamin (VITAMIN B-12 PO) Take 1 tablet by mouth daily.     cyclobenzaprine (FLEXERIL) 5 MG tablet Take 1 tablet (5 mg total) by mouth 3 (three) times daily as needed for muscle spasms. 30 tablet 0   dexamethasone  (DECADRON ) 4 MG tablet Take 2 tablets TWICE a day the day before, the day of, and the day after chemotherapy 40 tablet 2   dexamethasone  (DECADRON ) 4 MG tablet Take 2 tabs by mouth 2 times daily starting day before chemo. Then take 2 tabs daily for 2 days starting day after chemo. Take with food. 30 tablet 1   ELIQUIS  5 MG TABS tablet Take 1 tablet by mouth twice daily 60 tablet 0   ezetimibe  (ZETIA ) 10 MG tablet Take 1 tablet (10 mg total) by mouth daily. 90 tablet 1   fluticasone  (FLONASE ) 50 MCG/ACT nasal spray Place 1-2 sprays into both nostrils daily. 16 g 6   gabapentin  (NEURONTIN ) 300 MG capsule Take 1 capsule by mouth twice daily 60 capsule 0   KRILL OIL PO Take 1 tablet by mouth daily.     lisinopril  (ZESTRIL ) 10 MG  tablet Take 1 tablet (10 mg total) by mouth daily. 90 tablet 3   MAGNESIUM PO Take 1 tablet by mouth daily.     methylPREDNISolone  (MEDROL  DOSEPAK) 4 MG TBPK tablet Take with signs of chronic sinusitis and take as directed 1 each 1   metoCLOPramide  (REGLAN ) 10 MG tablet Take 1 tablet (10 mg total) by mouth 4 (four) times daily -  before meals and at bedtime. 56 tablet 0   metoprolol  tartrate (LOPRESSOR ) 25 MG tablet Take 1 tablet (25 mg total) by mouth once for 1 dose. Please take this medication 2 hours before CT 1 tablet 0   morphine  (MS CONTIN ) 15 MG 12 hr tablet Take 1 tablet (15 mg total) by mouth every 8 (eight) hours. 90 tablet 0   Multiple Vitamins-Minerals (CENTRUM SILVER PO) Take 1 tablet  by mouth daily.     Multiple Vitamins-Minerals (ZINC PO) Take 1 tablet by mouth daily.     Naphazoline-Pheniramine (OPCON-A) 0.027-0.315 % SOLN Place 1 drop into both eyes daily as needed (redness).     nitroGLYCERIN  (NITROSTAT ) 0.4 MG SL tablet Place 1 tablet (0.4 mg total) under the tongue every 5 (five) minutes as needed for chest pain. 25 tablet 1   omeprazole  (PRILOSEC) 20 MG capsule Take 1 capsule (20 mg total) by mouth daily. 90 capsule 1   ondansetron  (ZOFRAN ) 8 MG tablet Take 1 tablet (8 mg total) by mouth every 8 (eight) hours as needed for nausea or vomiting. 30 tablet 1   oxyCODONE  (OXY IR/ROXICODONE ) 5 MG immediate release tablet Take 1 tablet (5 mg total) by mouth every 4 (four) hours as needed for severe pain (pain score 7-10) or breakthrough pain. 90 tablet 0   potassium chloride  SA (KLOR-CON  M) 20 MEQ tablet Take 1 tablet (20 mEq total) by mouth daily. 6 tablet 0   prochlorperazine  (COMPAZINE ) 10 MG tablet Take 1 tablet (10 mg total) by mouth every 6 (six) hours as needed for nausea or vomiting. 30 tablet 0   prochlorperazine  (COMPAZINE ) 10 MG tablet Take 1 tablet (10 mg total) by mouth every 6 (six) hours as needed. 30 tablet 2   prochlorperazine  (COMPAZINE ) 10 MG tablet Take 1 tablet (10  mg total) by mouth every 6 (six) hours as needed for nausea or vomiting. 30 tablet 1   rosuvastatin  (CRESTOR ) 5 MG tablet Take 1 tablet (5 mg total) by mouth at bedtime. 90 tablet 1   sucralfate  (CARAFATE ) 1 g tablet Take 1 tablet (1 g total) by mouth 4 (four) times daily -  with meals and at bedtime. Crush and dissolve in 10 mL's of warm water prior to swallowing, take 20 min prior to meals (Patient taking differently: Take 1 g by mouth as needed. Crush and dissolve in 10 mL's of warm water prior to swallowing, take 20 min prior to meals) 60 tablet 1   triamcinolone  0.1%-Eucerin equivalent 1:1 cream mixture Apply topically 3 (three) times daily as needed. 480 g 2   TURMERIC PO Take 1 tablet by mouth 3 (three) times a week.     umeclidinium-vilanterol (ANORO ELLIPTA ) 62.5-25 MCG/ACT AEPB Inhale 1 puff into the lungs daily. 180 each 3   No current facility-administered medications for this visit.    SURGICAL HISTORY:  Past Surgical History:  Procedure Laterality Date   BRONCHIAL BIOPSY  05/26/2022   Procedure: BRONCHIAL BIOPSIES;  Surgeon: Brenna Adine CROME, DO;  Location: [facility];  Service: Pulmonary;;   BRONCHIAL BRUSHINGS  05/26/2022   Procedure: BRONCHIAL BRUSHINGS;  Surgeon: Brenna Adine CROME, DO;  Location: [facility];  Service: Pulmonary;;   NO PAST SURGERIES     VIDEO BRONCHOSCOPY  05/26/2022   Procedure: VIDEO BRONCHOSCOPY WITHOUT FLUORO;  Surgeon: Brenna Adine CROME, DO;  Location: [facility];  Service: Pulmonary;;    REVIEW OF SYSTEMS:   Review of Systems  Constitutional: Positive for weight gain.  Negative for appetite change, chills, and fever.  HENT: Negative for mouth sores, nosebleeds, sore throat and trouble swallowing.   Eyes: Negative for eye problems and icterus.  Respiratory: Positive for dyspnea on exertion and intermittent cough. Negative for wheezing.   Cardiovascular: Negative for chest pain and leg swelling.  Gastrointestinal: Negative for abdominal pain,   diarrhea, nausea and vomiting.  Genitourinary: Negative for bladder incontinence, difficulty urinating, dysuria, frequency and hematuria.   Musculoskeletal:  Negative for back pain, gait problem, neck pain and neck stiffness.  Skin: Negative for itching and rash.  Neurological: Negative for dizziness, extremity weakness, gait problem, headaches, light-headedness and seizures.  Hematological: Negative for adenopathy. Does not bruise/bleed easily.  Psychiatric/Behavioral: Negative for confusion, depression and sleep disturbance. The patient is not nervous/anxious.     PHYSICAL EXAMINATION:  There were no vitals taken for this visit.  ECOG PERFORMANCE STATUS: 1  Physical Exam  Constitutional: Oriented to person, place, and time and thin appearing male, and in no distress. HENT:  Head: Normocephalic and atraumatic.  Mouth/Throat: Oropharynx is clear and moist. No oropharyngeal exudate.  Eyes: Conjunctivae are normal. Right eye exhibits no discharge. Left eye exhibits no discharge. No scleral icterus.  Neck: Normal range of motion. Neck supple.  Cardiovascular: Normal rate, regular rhythm, normal heart sounds and intact distal pulses.   Pulmonary/Chest: Effort normal and breath sounds normal. No respiratory distress. No wheezes. No rales.  Abdominal: Soft. Bowel sounds are normal. Exhibits no distension and no mass. There is no tenderness.  Musculoskeletal: Normal range of motion. Exhibits no edema.  Lymphadenopathy:    No cervical adenopathy.  Neurological: Alert and oriented to person, place, and time. Exhibits muscle wasting. Gait normal. Coordination normal.  Skin: Skin is warm and dry. No rash noted. Not diaphoretic. No erythema. No pallor.  Psychiatric: Mood, memory and judgment normal.  Vitals reviewed.  LABORATORY DATA: Lab Results  Component Value Date   WBC 16.6 (H) 01/18/2024   HGB 9.8 (L) 01/18/2024   HCT 30.4 (L) 01/18/2024   MCV 90.2 01/18/2024   PLT 259 01/18/2024       Chemistry      Component Value Date/Time   NA 142 01/18/2024 0851   NA 146 (H) 01/05/2023 1114   K 4.1 01/18/2024 0851   CL 108 01/18/2024 0851   CO2 23 01/18/2024 0851   BUN 14 01/18/2024 0851   BUN 11 01/05/2023 1114   CREATININE 0.69 01/18/2024 0851   CREATININE 1.07 08/27/2014 1511      Component Value Date/Time   CALCIUM  8.4 (L) 01/18/2024 0851   ALKPHOS 70 01/18/2024 0851   AST 22 01/18/2024 0851   ALT 12 01/18/2024 0851   BILITOT <0.2 01/18/2024 0851       RADIOGRAPHIC STUDIES:  No results found.   ASSESSMENT/PLAN:  This is a very pleasant 68 year old African-American male with stage IV (T2a, N2, M1 B non-small cell lung cancer, squamous cell carcinoma.  He presented with a left upper lobe lung mass in addition to AP window lymphadenopathy and suspicious for metastases to T8 and L4 vertebrae as well as right retroperitoneal metastatic nodule.  He was diagnosed in April 2024.   His molecular studies by [email or web address] showed no actionable mutation.  His PD-L1 expression is 70%.   He underwent palliative radiation to the metastatic bone lesions.   He also receives Xgeva  every 6 weeks.     He is underwent palliative systemic chemotherapy and immunotherapy with carboplatin  for AUC of 5, paclitaxel  175 mg/m, and immunotherapy with Libtayo  350 mg IV every 3 weeks with Neulasta  support.  He is status post 4 cycles.     He started maintenance immunotherapy with Libtayo . He is status post 20 cycles of maintenance.    His restaging CT scan from June 2025 showed increased size of the left hilar mass which causes central obstruction of a left upper lobe bronchus, increase in the left hilar and mediastinal lymph nodes, and increased in  numerous ground glass pulmonary nodules which are nonspecific.  Dr. Sherrod recommended referral to radiation to discuss palliative radiation to the obstructive lung mass.    He underwent radiation to the left hilar mass which was completed on  08/11/23.    He had a CT scan which showed progression.    Dr. Sherrod recommended docetaxel  and Cyramza . He is status post 2 cycles with on 12/08/23. He seems to be tolerating this fairly well.    We will continue to monitor his labs closely on a weekly basis.    He will continue taking his iron supplement for anemia.  He does not need a blood transfusion at this time.  I do have standing orders for sample blood bank should he require a blood transfusion.   Will continue taking his blood thinner for now due to his history of PE.SABRA   He is undergoing palliative radiation to the right gluteal subcutaneous nodule and left paraspinal mass under the care of Dr. Shannon. His last day of radiation is scheduled for 01/19/24.   I will arrange for a restaging CT scan prior to his next cycle of treatment.    Chemotherapy-induced epistaxis Occasional blood-tinged nasal discharge, recent episode resolved spontaneously. - Advised use of saline nasal spray to maintain nasal mucosal moisture. - Advised to avoid forceful nose blowing. - Instructed to report persistent or significant bleeding. - Discussed that significant or persistent bleeding may require holding chemotherapy.  Constipation Well controlled with Senokot after dose adjustment. - Reviewed bowel regimen and confirmed current management is effective. - Advised to continue current Senokot dosing and adjust as needed if symptoms recur.  Chemotherapy-induced anemia Mild anemia with hemoglobin decrease from 10.2 to 9.8 g/dL. Increased cold sensitivity noted. No transfusion needed. - Monitored hemoglobin; most recent value 9.8 g/dL. - Advised that current anemia does not require transfusion but may contribute to cold intolerance. - Planned continued monitoring of hemoglobin with routine labs.   The patient was advised to call immediately if he has any concerning symptoms in the interval. The patient voices understanding of current disease  status and treatment options and is in agreement with the current care plan. All questions were answered. The patient knows to call the clinic with any problems, questions or concerns. We can certainly see the patient much sooner if necessary   Orders Placed This Encounter  Procedures   CT CHEST ABDOMEN PELVIS W CONTRAST    Standing Status:   Future    Expected Date:   02/01/2024    Expiration Date:   01/17/2025    If indicated for the ordered procedure, I authorize the administration of contrast media per Radiology protocol:   Yes    Does the patient have a contrast media/X-ray dye allergy?:   No    Preferred imaging location?:   [facility]    If indicated for the ordered procedure, I authorize the administration of oral contrast media per Radiology protocol:   Yes     The total time spent in the appointment was 20-29 minutes  Albert Hardy L Maryanne Huneycutt, PA-C 01/18/24

## 2024-01-13 ENCOUNTER — Other Ambulatory Visit: Payer: Self-pay

## 2024-01-13 ENCOUNTER — Ambulatory Visit
Admission: RE | Admit: 2024-01-13 | Discharge: 2024-01-13 | Disposition: A | Source: Ambulatory Visit | Attending: Radiation Oncology | Admitting: Radiation Oncology

## 2024-01-13 DIAGNOSIS — Z510 Encounter for antineoplastic radiation therapy: Secondary | ICD-10-CM | POA: Diagnosis not present

## 2024-01-13 LAB — RAD ONC ARIA SESSION SUMMARY
Course Elapsed Days: 16
Plan Fractions Treated to Date: 11
Plan Prescribed Dose Per Fraction: 2.5 Gy
Plan Total Fractions Prescribed: 15
Plan Total Prescribed Dose: 37.5 Gy
Reference Point Dosage Given to Date: 27.5 Gy
Reference Point Session Dosage Given: 2.5 Gy
Session Number: 11

## 2024-01-14 ENCOUNTER — Ambulatory Visit
Admission: RE | Admit: 2024-01-14 | Discharge: 2024-01-14 | Disposition: A | Source: Ambulatory Visit | Attending: Radiation Oncology | Admitting: Radiation Oncology

## 2024-01-14 ENCOUNTER — Other Ambulatory Visit: Payer: Self-pay

## 2024-01-14 DIAGNOSIS — Z510 Encounter for antineoplastic radiation therapy: Secondary | ICD-10-CM | POA: Diagnosis not present

## 2024-01-14 LAB — RAD ONC ARIA SESSION SUMMARY
Course Elapsed Days: 17
Plan Fractions Treated to Date: 12
Plan Prescribed Dose Per Fraction: 2.5 Gy
Plan Total Fractions Prescribed: 15
Plan Total Prescribed Dose: 37.5 Gy
Reference Point Dosage Given to Date: 30 Gy
Reference Point Session Dosage Given: 2.5 Gy
Session Number: 12

## 2024-01-17 ENCOUNTER — Ambulatory Visit
Admission: RE | Admit: 2024-01-17 | Discharge: 2024-01-17 | Disposition: A | Source: Ambulatory Visit | Attending: Radiation Oncology | Admitting: Radiation Oncology

## 2024-01-17 ENCOUNTER — Other Ambulatory Visit: Payer: Self-pay

## 2024-01-17 ENCOUNTER — Ambulatory Visit
Admission: RE | Admit: 2024-01-17 | Discharge: 2024-01-17 | Attending: Radiation Oncology | Admitting: Radiation Oncology

## 2024-01-17 DIAGNOSIS — Z510 Encounter for antineoplastic radiation therapy: Secondary | ICD-10-CM | POA: Diagnosis not present

## 2024-01-17 LAB — RAD ONC ARIA SESSION SUMMARY
Course Elapsed Days: 20
Plan Fractions Treated to Date: 13
Plan Prescribed Dose Per Fraction: 2.5 Gy
Plan Total Fractions Prescribed: 15
Plan Total Prescribed Dose: 37.5 Gy
Reference Point Dosage Given to Date: 32.5 Gy
Reference Point Session Dosage Given: 2.5 Gy
Session Number: 13

## 2024-01-18 ENCOUNTER — Inpatient Hospital Stay: Attending: Internal Medicine

## 2024-01-18 ENCOUNTER — Inpatient Hospital Stay: Admitting: Physician Assistant

## 2024-01-18 ENCOUNTER — Encounter: Payer: Self-pay | Admitting: Nurse Practitioner

## 2024-01-18 ENCOUNTER — Inpatient Hospital Stay

## 2024-01-18 ENCOUNTER — Ambulatory Visit
Admission: RE | Admit: 2024-01-18 | Discharge: 2024-01-18 | Disposition: A | Source: Ambulatory Visit | Attending: Radiation Oncology | Admitting: Radiation Oncology

## 2024-01-18 ENCOUNTER — Inpatient Hospital Stay (HOSPITAL_BASED_OUTPATIENT_CLINIC_OR_DEPARTMENT_OTHER): Admitting: Nurse Practitioner

## 2024-01-18 ENCOUNTER — Other Ambulatory Visit: Payer: Self-pay

## 2024-01-18 ENCOUNTER — Ambulatory Visit

## 2024-01-18 VITALS — BP 153/62 | HR 75 | Temp 98.1°F | Resp 18 | Wt 130.5 lb

## 2024-01-18 VITALS — BP 142/89 | HR 84 | Temp 97.6°F | Resp 18 | Wt 131.0 lb

## 2024-01-18 DIAGNOSIS — R110 Nausea: Secondary | ICD-10-CM

## 2024-01-18 DIAGNOSIS — Z79620 Long term (current) use of immunosuppressive biologic: Secondary | ICD-10-CM | POA: Insufficient documentation

## 2024-01-18 DIAGNOSIS — C3412 Malignant neoplasm of upper lobe, left bronchus or lung: Secondary | ICD-10-CM | POA: Insufficient documentation

## 2024-01-18 DIAGNOSIS — Z515 Encounter for palliative care: Secondary | ICD-10-CM | POA: Diagnosis not present

## 2024-01-18 DIAGNOSIS — Z5189 Encounter for other specified aftercare: Secondary | ICD-10-CM | POA: Insufficient documentation

## 2024-01-18 DIAGNOSIS — Z87891 Personal history of nicotine dependence: Secondary | ICD-10-CM | POA: Insufficient documentation

## 2024-01-18 DIAGNOSIS — Z5111 Encounter for antineoplastic chemotherapy: Secondary | ICD-10-CM

## 2024-01-18 DIAGNOSIS — C3492 Malignant neoplasm of unspecified part of left bronchus or lung: Secondary | ICD-10-CM

## 2024-01-18 DIAGNOSIS — C3490 Malignant neoplasm of unspecified part of unspecified bronchus or lung: Secondary | ICD-10-CM

## 2024-01-18 DIAGNOSIS — C7951 Secondary malignant neoplasm of bone: Secondary | ICD-10-CM | POA: Insufficient documentation

## 2024-01-18 DIAGNOSIS — G893 Neoplasm related pain (acute) (chronic): Secondary | ICD-10-CM | POA: Diagnosis not present

## 2024-01-18 DIAGNOSIS — Z5112 Encounter for antineoplastic immunotherapy: Secondary | ICD-10-CM | POA: Insufficient documentation

## 2024-01-18 DIAGNOSIS — Z510 Encounter for antineoplastic radiation therapy: Secondary | ICD-10-CM | POA: Diagnosis not present

## 2024-01-18 DIAGNOSIS — R1013 Epigastric pain: Secondary | ICD-10-CM

## 2024-01-18 DIAGNOSIS — R530 Neoplastic (malignant) related fatigue: Secondary | ICD-10-CM | POA: Diagnosis not present

## 2024-01-18 LAB — CBC WITH DIFFERENTIAL (CANCER CENTER ONLY)
Abs Immature Granulocytes: 0.15 K/uL — ABNORMAL HIGH (ref 0.00–0.07)
Basophils Absolute: 0 K/uL (ref 0.0–0.1)
Basophils Relative: 0 %
Eosinophils Absolute: 0 K/uL (ref 0.0–0.5)
Eosinophils Relative: 0 %
HCT: 30.4 % — ABNORMAL LOW (ref 39.0–52.0)
Hemoglobin: 9.8 g/dL — ABNORMAL LOW (ref 13.0–17.0)
Immature Granulocytes: 1 %
Lymphocytes Relative: 2 %
Lymphs Abs: 0.3 K/uL — ABNORMAL LOW (ref 0.7–4.0)
MCH: 29.1 pg (ref 26.0–34.0)
MCHC: 32.2 g/dL (ref 30.0–36.0)
MCV: 90.2 fL (ref 80.0–100.0)
Monocytes Absolute: 1 K/uL (ref 0.1–1.0)
Monocytes Relative: 6 %
Neutro Abs: 15.2 K/uL — ABNORMAL HIGH (ref 1.7–7.7)
Neutrophils Relative %: 91 %
Platelet Count: 259 K/uL (ref 150–400)
RBC: 3.37 MIL/uL — ABNORMAL LOW (ref 4.22–5.81)
RDW: 18.1 % — ABNORMAL HIGH (ref 11.5–15.5)
WBC Count: 16.6 K/uL — ABNORMAL HIGH (ref 4.0–10.5)
nRBC: 0 % (ref 0.0–0.2)

## 2024-01-18 LAB — RAD ONC ARIA SESSION SUMMARY
Course Elapsed Days: 21
Plan Fractions Treated to Date: 14
Plan Prescribed Dose Per Fraction: 2.5 Gy
Plan Total Fractions Prescribed: 15
Plan Total Prescribed Dose: 37.5 Gy
Reference Point Dosage Given to Date: 35 Gy
Reference Point Session Dosage Given: 2.5 Gy
Session Number: 14

## 2024-01-18 LAB — CMP (CANCER CENTER ONLY)
ALT: 12 U/L (ref 0–44)
AST: 22 U/L (ref 15–41)
Albumin: 3.3 g/dL — ABNORMAL LOW (ref 3.5–5.0)
Alkaline Phosphatase: 70 U/L (ref 38–126)
Anion gap: 11 (ref 5–15)
BUN: 14 mg/dL (ref 8–23)
CO2: 23 mmol/L (ref 22–32)
Calcium: 8.4 mg/dL — ABNORMAL LOW (ref 8.9–10.3)
Chloride: 108 mmol/L (ref 98–111)
Creatinine: 0.69 mg/dL (ref 0.61–1.24)
GFR, Estimated: >60 mL/min (ref >60)
Glucose, Bld: 133 mg/dL — ABNORMAL HIGH (ref 70–99)
Potassium: 4.1 mmol/L (ref 3.5–5.1)
Sodium: 142 mmol/L (ref 135–145)
Total Bilirubin: <0.2 mg/dL (ref 0.0–1.2)
Total Protein: 6.5 g/dL (ref 6.5–8.1)

## 2024-01-18 LAB — TSH: TSH: 0.871 u[IU]/mL (ref 0.350–4.500)

## 2024-01-18 LAB — TOTAL PROTEIN, URINE DIPSTICK: Protein, ur: 30 mg/dL — AB

## 2024-01-18 MED ORDER — SODIUM CHLORIDE 0.9 % IV SOLN: INTRAVENOUS | Status: DC

## 2024-01-18 MED ORDER — DIPHENHYDRAMINE HCL 50 MG/ML IJ SOLN
50.0000 mg | Freq: Once | INTRAMUSCULAR | Status: AC
  Administered 2024-01-18: 50 mg via INTRAVENOUS
  Filled 2024-01-18: qty 1

## 2024-01-18 MED ORDER — OXYCODONE HCL 5 MG PO TABS: 5.0000 mg | ORAL_TABLET | ORAL | 0 refills | Status: DC | PRN

## 2024-01-18 MED ORDER — SODIUM CHLORIDE 0.9 % IV SOLN
75.0000 mg/m2 | Freq: Once | INTRAVENOUS | Status: AC
  Administered 2024-01-18: 120 mg via INTRAVENOUS
  Filled 2024-01-18: qty 12

## 2024-01-18 MED ORDER — ACETAMINOPHEN 325 MG PO TABS
650.0000 mg | ORAL_TABLET | Freq: Once | ORAL | Status: AC
  Administered 2024-01-18: 650 mg via ORAL
  Filled 2024-01-18: qty 2

## 2024-01-18 MED ORDER — DEXAMETHASONE SOD PHOSPHATE PF 10 MG/ML IJ SOLN
10.0000 mg | Freq: Once | INTRAMUSCULAR | Status: AC
  Administered 2024-01-18: 10 mg via INTRAVENOUS

## 2024-01-18 MED ORDER — SODIUM CHLORIDE 0.9% FLUSH: 10.0000 mL | INTRAVENOUS | Status: DC | PRN

## 2024-01-18 MED ORDER — SODIUM CHLORIDE 0.9 % IV SOLN
600.0000 mg | Freq: Once | INTRAVENOUS | Status: AC
  Administered 2024-01-18: 600 mg via INTRAVENOUS
  Filled 2024-01-18: qty 10

## 2024-01-18 NOTE — Progress Notes (Signed)
 Palliative Medicine [facility]  [phone] [phone]   Name: Albert Hardy. Date: 01/18/2024 MRN: [id number]  DOB: 05/26/1955  Patient Care Team: [name], MD as PCP - General (Family Medicine)    INTERVAL HISTORY: Albert Hardy. is a 68 y.o. 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 Palliative ask to see for symptom management and goals of care   SOCIAL HISTORY:     reports that he quit smoking about 12 years ago. His smoking use included cigarettes. He started smoking about 42 years ago. He has a 30 pack-year smoking history. He has never used smokeless tobacco. He reports current alcohol use. He reports current drug use. Frequency: 1.00 time per week. Drug: Marijuana.  ADVANCE DIRECTIVES:  None on file  CODE STATUS: Full code  PAST MEDICAL HISTORY: Past Medical History:  Diagnosis Date   Aortic regurgitation    Aortic stenosis 04/04/2018   Atherosclerosis of native arteries of the extremities with intermittent claudication 09/08/2013   Atherosclerosis of native artery of extremity with intermittent claudication 09/08/2013   IMO SNOMED Dx Update Oct 2024     Bilateral impacted cerumen 10/13/2018   Bilateral sensorineural hearing loss 12/14/2018   Cancer (HCC)    lung   Dyspnea    Encounter for antineoplastic chemotherapy 08/18/2022   Encounter for antineoplastic immunotherapy 08/18/2022   Former moderate cigarette smoker (10-19 per day) 05/20/2012   Quit smoking Nov 2013 when hospitalized w/ HTN and chest pain(diagnosed w/ valvular heart disease)   GERD (gastroesophageal reflux disease)    Heart murmur    History of radiation therapy    Lumbar Spine, Thoracic Spine- 06/25/22-07/10/22- Dr. Lynwood Nasuti   History of radiation therapy    Left lung-07/29/23-08/11/23- Dr. 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  MEDICATIONS:  Current Outpatient Medications  Medication Sig Dispense Refill   albuterol  (VENTOLIN  HFA) 108 (90 Base) MCG/ACT inhaler Inhale 2 puffs into the lungs every 6 (six) hours as needed for wheezing or shortness of breath. 8 g 6   Ascorbic Acid (VITAMIN C PO) Take 1 tablet by mouth daily.     azithromycin  (ZITHROMAX  Z-PAK) 250 MG tablet Take as directed 6 each 0   benzonatate  (TESSALON ) 100 MG capsule Take 1 capsule (100 mg total) by mouth 3 (three) times daily as needed. (Patient not taking: Reported on 01/18/2024) 30 capsule 2   Cholecalciferol (VITAMIN D3) 1000 UNITS CAPS Take 1,000 Units by mouth daily.     Coenzyme Q10 (CO Q 10 PO) Take 1 tablet by mouth daily.     Cyanocobalamin (VITAMIN B-12 PO) Take 1 tablet by mouth daily.     cyclobenzaprine (FLEXERIL) 5 MG tablet Take 1 tablet (5 mg total) by mouth 3 (three) times daily as needed for muscle spasms. (Patient not taking: Reported on 01/18/2024) 30 tablet 0   dexamethasone  (DECADRON ) 4 MG tablet Take 2 tablets TWICE a day the day before, the day of, and the day after chemotherapy 40 tablet 2   dexamethasone  (DECADRON ) 4 MG tablet  Take 2 tabs by mouth 2 times daily starting day before chemo. Then take 2 tabs daily for 2 days starting day after chemo. Take with food. 30 tablet 1   ELIQUIS  5 MG TABS tablet Take 1 tablet by mouth twice daily 60 tablet 0   ezetimibe  (ZETIA ) 10 MG tablet Take 1 tablet (10 mg total) by mouth daily. 90 tablet 1   fluticasone  (FLONASE ) 50 MCG/ACT nasal spray Place 1-2 sprays into both nostrils daily. 16 g 6   gabapentin  (NEURONTIN ) 300 MG capsule Take 1 capsule by mouth twice daily (Patient not  taking: Reported on 01/18/2024) 60 capsule 0   KRILL OIL PO Take 1 tablet by mouth daily.     lisinopril  (ZESTRIL ) 10 MG tablet Take 1 tablet (10 mg total) by mouth daily. 90 tablet 3   MAGNESIUM PO Take 1 tablet by mouth daily.     methylPREDNISolone  (MEDROL  DOSEPAK) 4 MG TBPK tablet Take with signs of chronic sinusitis and take as directed (Patient not taking: Reported on 01/18/2024) 1 each 1   metoCLOPramide  (REGLAN ) 10 MG tablet Take 1 tablet (10 mg total) by mouth 4 (four) times daily -  before meals and at bedtime. (Patient not taking: Reported on 01/18/2024) 56 tablet 0   metoprolol  tartrate (LOPRESSOR ) 25 MG tablet Take 1 tablet (25 mg total) by mouth once for 1 dose. Please take this medication 2 hours before CT 1 tablet 0   morphine  (MS CONTIN ) 15 MG 12 hr tablet Take 1 tablet (15 mg total) by mouth every 8 (eight) hours. 90 tablet 0   Multiple Vitamins-Minerals (CENTRUM SILVER PO) Take 1 tablet by mouth daily.     Multiple Vitamins-Minerals (ZINC PO) Take 1 tablet by mouth daily.     Naphazoline-Pheniramine (OPCON-A) 0.027-0.315 % SOLN Place 1 drop into both eyes daily as needed (redness).     nitroGLYCERIN  (NITROSTAT ) 0.4 MG SL tablet Place 1 tablet (0.4 mg total) under the tongue every 5 (five) minutes as needed for chest pain. 25 tablet 1   omeprazole  (PRILOSEC) 20 MG capsule Take 1 capsule (20 mg total) by mouth daily. 90 capsule 1   ondansetron  (ZOFRAN ) 8 MG tablet Take 1 tablet (8 mg total) by mouth every 8 (eight) hours as needed for nausea or vomiting. 30 tablet 1   oxyCODONE  (OXY IR/ROXICODONE ) 5 MG immediate release tablet Take 1 tablet (5 mg total) by mouth every 4 (four) hours as needed for severe pain (pain score 7-10) or breakthrough pain. 90 tablet 0   potassium chloride  SA (KLOR-CON  M) 20 MEQ tablet Take 1 tablet (20 mEq total) by mouth daily. 6 tablet 0   prochlorperazine  (COMPAZINE ) 10 MG tablet Take 1 tablet (10 mg total) by mouth every 6 (six) hours as needed for nausea or  vomiting. 30 tablet 0   prochlorperazine  (COMPAZINE ) 10 MG tablet Take 1 tablet (10 mg total) by mouth every 6 (six) hours as needed. (Patient not taking: Reported on 01/18/2024) 30 tablet 2   prochlorperazine  (COMPAZINE ) 10 MG tablet Take 1 tablet (10 mg total) by mouth every 6 (six) hours as needed for nausea or vomiting. 30 tablet 1   rosuvastatin  (CRESTOR ) 5 MG tablet Take 1 tablet (5 mg total) by mouth at bedtime. 90 tablet 1   sucralfate  (CARAFATE ) 1 g tablet Take 1 tablet (1 g total) by mouth 4 (four) times daily -  with meals and at bedtime. Crush and dissolve in 10 mL's of warm water prior to swallowing, take 20  min prior to meals 60 tablet 1   triamcinolone  0.1%-Eucerin equivalent 1:1 cream mixture Apply topically 3 (three) times daily as needed. 480 g 2   TURMERIC PO Take 1 tablet by mouth 3 (three) times a week.     umeclidinium-vilanterol (ANORO ELLIPTA ) 62.5-25 MCG/ACT AEPB Inhale 1 puff into the lungs daily. 180 each 3   No current facility-administered medications for this visit.   Facility-Administered Medications Ordered in Other Visits  Medication Dose Route Frequency Provider Last Rate Last Admin   0.9 %  sodium chloride  infusion   Intravenous Continuous Sherrod Sherrod, MD 10 mL/hr at 01/18/24 1002 New Bag at 01/18/24 1002   DOCEtaxel  (TAXOTERE ) 120 mg in sodium chloride  0.9 % 250 mL chemo infusion  75 mg/m2 (Treatment Plan Recorded) Intravenous Once Mohamed, Mohamed, MD       sodium chloride  flush (NS) 0.9 % injection 10 mL  10 mL Intracatheter PRN Sherrod Sherrod, MD        VITAL SIGNS: There were no vitals taken for this visit. There were no vitals filed for this visit.  Estimated body mass index is 19.35 kg/m as calculated from the following:   Height as of 12/13/23: 5' 9 (1.753 m).   Weight as of an earlier encounter on 01/18/24: 131 lb 0.1 oz (59.4 kg).     Latest Ref Rng & Units 01/18/2024    8:51 AM 12/28/2023    8:02 AM 12/20/2023    9:05 AM  CBC  WBC 4.0  - 10.5 K/uL 16.6  16.8  13.1   Hemoglobin 13.0 - 17.0 g/dL 9.8  89.7  88.6   Hematocrit 39.0 - 52.0 % 30.4  32.6  35.3   Platelets 150 - 400 K/uL 259  437  263        Latest Ref Rng & Units 01/18/2024    8:51 AM 12/28/2023    8:02 AM 12/20/2023    9:05 AM  CMP  Glucose 70 - 99 mg/dL 866  818  91   BUN 8 - 23 mg/dL 14  13  12    Creatinine 0.61 - 1.24 mg/dL 9.30  9.18  9.21   Sodium 135 - 145 mmol/L 142  141  141   Potassium 3.5 - 5.1 mmol/L 4.1  4.5  3.5   Chloride 98 - 111 mmol/L 108  108  104   CO2 22 - 32 mmol/L 23  26  28    Calcium  8.9 - 10.3 mg/dL 8.4  8.4  8.6   Total Protein 6.5 - 8.1 g/dL 6.5  6.5  6.7   Total Bilirubin 0.0 - 1.2 mg/dL <9.7  0.2  0.3   Alkaline Phos 38 - 126 U/L 70  69  88   AST 15 - 41 U/L 22  15  24    ALT 0 - 44 U/L 12  15  19      PERFORMANCE STATUS (ECOG) : 1 - Symptomatic but completely ambulatory   Physical Exam General: NAD Cardiovascular: RRR Pulmonary: normal breathing pattern  Extremities: no edema, no joint deformities Skin: no rashes Neurological: AAO x4  IMPRESSION: Discussed the use of AI scribe software for clinical note transcription with the patient, who gave verbal consent to proceed.  History of Present Illness Albert Yamada. is a 68 year old male who was seen during infusion for symptom management follow-up. Denies nausea, vomiting, constipation, or diarrhea. Appetite fluctuates. Some days are better than others. Weight is up to 131lbs from 128lbs. He continues to  take things one day at a time. Currently undergoing radiation with last scheduled treatment tomorrow. Ongoing fatigue however he is trying to remain as active as possible, while listening to his body and taking breaks as needed.   Albert Hardy reports his pain overall has been well controlled on current regimen. He is currently taking MS Contin  15mg  every 12 hours. During episodes of severe pain. He manages the pain with Tylenol  Arthritis and has started taking Oxycodone   every 4 hours as needed, which significantly alleviates the pain.  We will continue to closely follow and support. All questions answered and support provided.  Goals of Care  06/18/22-  We discussed his current illness and what it means in the larger context of hsi on-going co-morbidities. Natural disease trajectory and expectations were discussed.  Albert Hardy and his wife are realistic in their expectations and understanding of his incurable cancer. He knows all treatment is palliative focused. Wishes to continue taking things one day at a time focusing on his quality of life allowing him every opportunity to continue to thrive.   We discussed Her current illness and what it means in the larger context of Her on-going co-morbidities. Natural disease trajectory and expectations were discussed.  I discussed the importance of continued conversation with family and their medical providers regarding overall plan of care and treatment options, ensuring decisions are within the context of the patients values and GOCs. Assessment & Plan Appetite fluctuations and weight loss Intermittent appetite fluctuations with associated weight loss.  Weight is up to 131 pounds.   - Encourage consumption of small, frequent meals - Reinforce the importance of maintaining nutritional intake  Chronic cancer related pain management Pain management is well-controlled with current medications. - Continue MS Contin  15 mg every 12 hours. - Continue oxycodone  5 mg every 4 hours as needed. -Tylenol  every 8 hours as needed  Fatigue Reports feeling fatigue which is attributed to current radiation treatments. Hopeful for improvement in upcoming scans. Last radiation is tomorrow.    I will plan to follow-up with patient in 4-6 weeks. Sooner if needed.  Patient expressed understanding and was in agreement with this plan. He also understands that He can call the clinic at any time with any questions, concerns, or complaints.    Any controlled substances utilized were prescribed in the context of palliative care. PDMP has been reviewed.   Visit consisted of counseling and education dealing with the complex and emotionally intense issues of symptom management and palliative care in the setting of serious and potentially life-threatening illness.  Albert Hardy, AGPCNP-BC  Palliative Medicine Team/[facility]

## 2024-01-18 NOTE — Progress Notes (Signed)
 Ok to adjust cyramza  dose based on today's wt to 600mg 

## 2024-01-18 NOTE — Patient Instructions (Signed)
 [facility] - A DEPT OF [facility]  Discharge Instructions: Thank you for choosing [facility] to provide your oncology and hematology care.   If you have a lab appointment with the Cancer Center, please go directly to the Cancer Center and check in at the registration area.   Wear comfortable clothing and clothing appropriate for easy access to any Portacath or PICC line.   We strive to give you quality time with your provider. You may need to reschedule your appointment if you arrive late (15 or more minutes).  Arriving late affects you and other patients whose appointments are after yours.  Also, if you miss three or more appointments without notifying the office, you may be dismissed from the clinic at the provider's discretion.      For prescription refill requests, have your pharmacy contact our office and allow 72 hours for refills to be completed.    Today you received the following chemotherapy and/or immunotherapy agents cyramza  and taxotere       To help prevent nausea and vomiting after your treatment, we encourage you to take your nausea medication as directed.  BELOW ARE SYMPTOMS THAT SHOULD BE REPORTED IMMEDIATELY: *FEVER GREATER THAN 100.4 F (38 C) OR HIGHER *CHILLS OR SWEATING *NAUSEA AND VOMITING THAT IS NOT CONTROLLED WITH YOUR NAUSEA MEDICATION *UNUSUAL SHORTNESS OF BREATH *UNUSUAL BRUISING OR BLEEDING *URINARY PROBLEMS (pain or burning when urinating, or frequent urination) *BOWEL PROBLEMS (unusual diarrhea, constipation, pain near the anus) TENDERNESS IN MOUTH AND THROAT WITH OR WITHOUT PRESENCE OF ULCERS (sore throat, sores in mouth, or a toothache) UNUSUAL RASH, SWELLING OR PAIN  UNUSUAL VAGINAL DISCHARGE OR ITCHING   Items with * indicate a potential emergency and should be followed up as soon as possible or go to the Emergency Department if any problems should occur.  Please show the CHEMOTHERAPY ALERT CARD or  IMMUNOTHERAPY ALERT CARD at check-in to the Emergency Department and triage nurse.  Should you have questions after your visit or need to cancel or reschedule your appointment, please contact [facility] - A DEPT OF [facility]  Dept: [phone]  and follow the prompts.  Office hours are 8:00 a.m. to 4:30 p.m. Monday - Friday. Please note that voicemails left after 4:00 p.m. may not be returned until the following business day.  We are closed weekends and major holidays. You have access to a nurse at all times for urgent questions. Please call the main number to the clinic Dept: [phone] and follow the prompts.   For any non-urgent questions, you may also contact your provider using MyChart. We now offer e-Visits for anyone 79 and older to request care online for non-urgent symptoms. For details visit [email or web address].   Also download the MyChart app! Go to the app store, search "MyChart", open the app, select [facility], and log in with your MyChart username and password.

## 2024-01-19 ENCOUNTER — Other Ambulatory Visit: Payer: Self-pay

## 2024-01-19 ENCOUNTER — Ambulatory Visit
Admission: RE | Admit: 2024-01-19 | Discharge: 2024-01-19 | Disposition: A | Discharge status: Home / Self Care | Source: Ambulatory Visit | Attending: Radiation Oncology | Admitting: Radiation Oncology

## 2024-01-19 DIAGNOSIS — Z510 Encounter for antineoplastic radiation therapy: Secondary | ICD-10-CM | POA: Diagnosis not present

## 2024-01-19 LAB — RAD ONC ARIA SESSION SUMMARY
Course Elapsed Days: 22
Plan Fractions Treated to Date: 15
Plan Prescribed Dose Per Fraction: 2.5 Gy
Plan Total Fractions Prescribed: 15
Plan Total Prescribed Dose: 37.5 Gy
Reference Point Dosage Given to Date: 37.5 Gy
Reference Point Session Dosage Given: 2.5 Gy
Session Number: 15

## 2024-01-19 LAB — T4: T4, Total: 8.3 ug/dL (ref 4.5–12.0)

## 2024-01-20 ENCOUNTER — Inpatient Hospital Stay

## 2024-01-20 VITALS — BP 133/62 | HR 71 | Temp 98.4°F | Resp 14

## 2024-01-20 DIAGNOSIS — Z510 Encounter for antineoplastic radiation therapy: Secondary | ICD-10-CM | POA: Diagnosis not present

## 2024-01-20 DIAGNOSIS — C3492 Malignant neoplasm of unspecified part of left bronchus or lung: Secondary | ICD-10-CM

## 2024-01-20 MED ORDER — PEGFILGRASTIM-FPGK 6 MG/0.6ML ~~LOC~~ SOSY
6.0000 mg | PREFILLED_SYRINGE | Freq: Once | SUBCUTANEOUS | Status: AC
  Administered 2024-01-20: 6 mg via SUBCUTANEOUS
  Filled 2024-01-20: qty 0.6

## 2024-01-21 NOTE — Radiation Completion Notes (Signed)
 Patient Name: Albert Hardy, PRESSNELL MRN: [id number] Date of Birth: [birth year] Referring Physician: SHERROD SHERROD, M.D. Date of Service: 2024-01-21 Radiation Oncologist: Signe Nasuti, M.D. [facility] [place]                             RADIATION ONCOLOGY END OF TREATMENT NOTE     Diagnosis: C79.51 Secondary malignant neoplasm of bone Staging on 2022-05-29: Squamous cell carcinoma of lung, stage IV, left (HCC) T=cT2a, N=cN2, M=cM1b Intent: Palliative     ==========DELIVERED PLANS==========  First Treatment Date: 2023-12-28 Last Treatment Date: 2024-01-19   Plan Name: Spine_Lumb Site: Lumbar Spine Technique: 3D Mode: Photon Dose Per Fraction: 2.5 Gy Prescribed Dose (Delivered / Prescribed): 37.5 Gy / 37.5 Gy Prescribed Fxs (Delivered / Prescribed): 15 / 15   Plan Name: Pelvis_L_g_BO Site: Pelvis Technique: 3D Mode: Photon Dose Per Fraction: 3 Gy Prescribed Dose (Delivered / Prescribed): 30 Gy / 30 Gy Prescribed Fxs (Delivered / Prescribed): 10 / 10   Plan Name: Pelvis_R_gl Site: Pelvis Technique: 3D Mode: Photon Dose Per Fraction: 3 Gy Prescribed Dose (Delivered / Prescribed): 30 Gy / 30 Gy Prescribed Fxs (Delivered / Prescribed): 10 / 10     ==========ON TREATMENT VISIT DATES========== 2023-12-28, 2024-01-04, 2024-01-17     ==========UPCOMING VISITS========== 03/02/2024 [facility] INJECTION [facility] FLUSH  02/29/2024 [facility] PALLIATIVE CARE    [facility] PALLIATIVE CARE  02/29/2024 [facility] EST PT 30 Heilingoetter, Cassandra L, PA-C  02/29/2024 [facility] INFUSION 3HR (180) [facility] INFUSION  02/29/2024 [facility] LAB [facility] LAB  02/11/2024 [facility] INJECTION [facility] FLUSH  02/08/2024 [facility] PALLIATIVE CARE    [facility] PALLIATIVE CARE  02/08/2024 [facility] LAB [facility] LAB  02/08/2024 [facility] EST PT 15 Sherrod Sherrod,  MD  02/08/2024 [facility] INFUSION 3HR (180) [facility] INFUSION  02/01/2024 [facility] IMAGING CT CHEST ABD/PELVIS W CONTRAST [facility]-CT 1        ==========APPENDIX - ON TREATMENT VISIT NOTES==========   See weekly On Treatment Notes in Epic for details in the Media tab (listed as Progress notes on the On Treatment Visit Dates listed above).

## 2024-02-01 ENCOUNTER — Ambulatory Visit (HOSPITAL_COMMUNITY)
Admission: RE | Admit: 2024-02-01 | Discharge: 2024-02-01 | Disposition: A | Discharge status: Home / Self Care | Source: Ambulatory Visit | Attending: Physician Assistant | Admitting: Physician Assistant

## 2024-02-01 DIAGNOSIS — C3492 Malignant neoplasm of unspecified part of left bronchus or lung: Secondary | ICD-10-CM | POA: Diagnosis present

## 2024-02-01 MED ORDER — IOHEXOL 300 MG/ML  SOLN
100.0000 mL | Freq: Once | INTRAMUSCULAR | Status: AC | PRN
  Administered 2024-02-01: 100 mL via INTRAVENOUS

## 2024-02-02 ENCOUNTER — Other Ambulatory Visit: Payer: Self-pay | Admitting: Nurse Practitioner

## 2024-02-02 DIAGNOSIS — I2699 Other pulmonary embolism without acute cor pulmonale: Secondary | ICD-10-CM

## 2024-02-02 MED ORDER — APIXABAN 5 MG PO TABS: 5.0000 mg | ORAL_TABLET | Freq: Two times a day (BID) | ORAL | 0 refills | Status: AC

## 2024-02-07 ENCOUNTER — Other Ambulatory Visit: Payer: Self-pay | Admitting: Family Medicine

## 2024-02-08 ENCOUNTER — Inpatient Hospital Stay

## 2024-02-08 ENCOUNTER — Encounter: Payer: Self-pay | Admitting: Nurse Practitioner

## 2024-02-08 ENCOUNTER — Inpatient Hospital Stay (HOSPITAL_BASED_OUTPATIENT_CLINIC_OR_DEPARTMENT_OTHER): Admitting: Internal Medicine

## 2024-02-08 ENCOUNTER — Inpatient Hospital Stay: Admitting: Nurse Practitioner

## 2024-02-08 VITALS — BP 155/69 | HR 62 | Temp 97.6°F | Resp 17 | Ht 69.0 in | Wt 133.5 lb

## 2024-02-08 DIAGNOSIS — C3492 Malignant neoplasm of unspecified part of left bronchus or lung: Secondary | ICD-10-CM

## 2024-02-08 DIAGNOSIS — C3490 Malignant neoplasm of unspecified part of unspecified bronchus or lung: Secondary | ICD-10-CM

## 2024-02-08 DIAGNOSIS — C7951 Secondary malignant neoplasm of bone: Secondary | ICD-10-CM | POA: Diagnosis not present

## 2024-02-08 DIAGNOSIS — Z515 Encounter for palliative care: Secondary | ICD-10-CM | POA: Diagnosis not present

## 2024-02-08 DIAGNOSIS — Z510 Encounter for antineoplastic radiation therapy: Secondary | ICD-10-CM | POA: Diagnosis not present

## 2024-02-08 DIAGNOSIS — G893 Neoplasm related pain (acute) (chronic): Secondary | ICD-10-CM

## 2024-02-08 DIAGNOSIS — Z681 Body mass index (BMI) 19 or less, adult: Secondary | ICD-10-CM

## 2024-02-08 DIAGNOSIS — R530 Neoplastic (malignant) related fatigue: Secondary | ICD-10-CM | POA: Diagnosis not present

## 2024-02-08 DIAGNOSIS — R634 Abnormal weight loss: Secondary | ICD-10-CM

## 2024-02-08 LAB — CMP (CANCER CENTER ONLY)
ALT: 15 U/L (ref 0–44)
AST: 22 U/L (ref 15–41)
Albumin: 3.4 g/dL — ABNORMAL LOW (ref 3.5–5.0)
Alkaline Phosphatase: 72 U/L (ref 38–126)
Anion gap: 9 (ref 5–15)
BUN: 13 mg/dL (ref 8–23)
CO2: 26 mmol/L (ref 22–32)
Calcium: 8.4 mg/dL — ABNORMAL LOW (ref 8.9–10.3)
Chloride: 108 mmol/L (ref 98–111)
Creatinine: 0.87 mg/dL (ref 0.61–1.24)
GFR, Estimated: >60 mL/min
Glucose, Bld: 116 mg/dL — ABNORMAL HIGH (ref 70–99)
Potassium: 3.7 mmol/L (ref 3.5–5.1)
Sodium: 143 mmol/L (ref 135–145)
Total Bilirubin: <0.2 mg/dL (ref 0.0–1.2)
Total Protein: 6.2 g/dL — ABNORMAL LOW (ref 6.5–8.1)

## 2024-02-08 LAB — CBC WITH DIFFERENTIAL (CANCER CENTER ONLY)
Abs Immature Granulocytes: 0.11 K/uL — ABNORMAL HIGH (ref 0.00–0.07)
Basophils Absolute: 0 K/uL (ref 0.0–0.1)
Basophils Relative: 0 %
Eosinophils Absolute: 0 K/uL (ref 0.0–0.5)
Eosinophils Relative: 0 %
HCT: 28.5 % — ABNORMAL LOW (ref 39.0–52.0)
Hemoglobin: 9.1 g/dL — ABNORMAL LOW (ref 13.0–17.0)
Immature Granulocytes: 1 %
Lymphocytes Relative: 5 %
Lymphs Abs: 0.6 K/uL — ABNORMAL LOW (ref 0.7–4.0)
MCH: 30.1 pg (ref 26.0–34.0)
MCHC: 31.9 g/dL (ref 30.0–36.0)
MCV: 94.4 fL (ref 80.0–100.0)
Monocytes Absolute: 1 K/uL (ref 0.1–1.0)
Monocytes Relative: 8 %
Neutro Abs: 11.3 K/uL — ABNORMAL HIGH (ref 1.7–7.7)
Neutrophils Relative %: 86 %
Platelet Count: 170 K/uL (ref 150–400)
RBC: 3.02 MIL/uL — ABNORMAL LOW (ref 4.22–5.81)
RDW: 21.6 % — ABNORMAL HIGH (ref 11.5–15.5)
WBC Count: 13 K/uL — ABNORMAL HIGH (ref 4.0–10.5)
nRBC: 0.2 % (ref 0.0–0.2)

## 2024-02-08 LAB — TOTAL PROTEIN, URINE DIPSTICK: Protein, ur: 30 mg/dL — AB

## 2024-02-08 MED ORDER — SODIUM CHLORIDE 0.9 % IV SOLN
75.0000 mg/m2 | Freq: Once | INTRAVENOUS | Status: AC
  Administered 2024-02-08: 120 mg via INTRAVENOUS
  Filled 2024-02-08: qty 12

## 2024-02-08 MED ORDER — ACETAMINOPHEN 325 MG PO TABS
650.0000 mg | ORAL_TABLET | Freq: Once | ORAL | Status: AC
  Administered 2024-02-08: 650 mg via ORAL
  Filled 2024-02-08: qty 2

## 2024-02-08 MED ORDER — DEXAMETHASONE SOD PHOSPHATE PF 10 MG/ML IJ SOLN
10.0000 mg | Freq: Once | INTRAMUSCULAR | Status: AC
  Administered 2024-02-08: 10 mg via INTRAVENOUS

## 2024-02-08 MED ORDER — SODIUM CHLORIDE 0.9 % IV SOLN
10.0000 mg/kg | Freq: Once | INTRAVENOUS | Status: AC
  Administered 2024-02-08: 600 mg via INTRAVENOUS
  Filled 2024-02-08: qty 10

## 2024-02-08 MED ORDER — MORPHINE SULFATE ER 15 MG PO TBCR: 15.0000 mg | EXTENDED_RELEASE_TABLET | Freq: Three times a day (TID) | ORAL | 0 refills | Status: AC

## 2024-02-08 MED ORDER — DIPHENHYDRAMINE HCL 50 MG/ML IJ SOLN
50.0000 mg | Freq: Once | INTRAMUSCULAR | Status: AC
  Administered 2024-02-08: 50 mg via INTRAVENOUS
  Filled 2024-02-08: qty 1

## 2024-02-08 MED ORDER — DENOSUMAB 120 MG/1.7ML ~~LOC~~ SOLN
120.0000 mg | Freq: Once | SUBCUTANEOUS | Status: AC
  Administered 2024-02-08: 120 mg via SUBCUTANEOUS
  Filled 2024-02-08: qty 1.7

## 2024-02-08 MED ORDER — OXYCODONE HCL 5 MG PO TABS: 5.0000 mg | ORAL_TABLET | ORAL | 0 refills | Status: DC | PRN

## 2024-02-08 MED ORDER — FUROSEMIDE 20 MG PO TABS: ORAL_TABLET | ORAL | 0 refills | Status: DC

## 2024-02-08 MED ORDER — SODIUM CHLORIDE 0.9 % IV SOLN: INTRAVENOUS | Status: DC

## 2024-02-08 NOTE — Progress Notes (Signed)
 "    Palliative Medicine [facility]  Telephone:([phone] Fax:([phone]) [phone]   Name: Albert Hardy. Date: 02/08/2024 MRN: [id number]  DOB: Apr 10, 1955  Patient Care Team: [name], MD as PCP - General (Family Medicine)    INTERVAL HISTORY: Albert Hardy. is a 68 y.o. 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 Palliative ask to see for symptom management and goals of care   SOCIAL HISTORY:     reports that he quit smoking about 12 years ago. His smoking use included cigarettes. He started smoking about 42 years ago. He has a 30 pack-year smoking history. He has never used smokeless tobacco. He reports current alcohol use. He reports current drug use. Frequency: 1.00 time per week. Drug: Marijuana.  ADVANCE DIRECTIVES:  None on file  CODE STATUS: Full code  PAST MEDICAL HISTORY: Past Medical History:  Diagnosis Date   Aortic regurgitation    Aortic stenosis 04/04/2018   Atherosclerosis of native arteries of the extremities with intermittent claudication 09/08/2013   Atherosclerosis of native artery of extremity with intermittent claudication 09/08/2013   IMO SNOMED Dx Update Oct 2024     Bilateral impacted cerumen 10/13/2018   Bilateral sensorineural hearing loss 12/14/2018   Cancer (HCC)    lung   Dyspnea    Encounter for antineoplastic chemotherapy 08/18/2022   Encounter for antineoplastic immunotherapy 08/18/2022   Former moderate cigarette smoker (10-19 per day) 05/20/2012   Quit smoking Nov 2013 when hospitalized w/ HTN and chest pain(diagnosed w/ valvular heart disease)   GERD (gastroesophageal reflux disease)    Heart murmur    History of radiation therapy    Lumbar Spine, Thoracic Spine- 06/25/22-07/10/22- Dr. Lynwood Nasuti   History of radiation therapy    Left lung-07/29/23-08/11/23- Dr. 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  MEDICATIONS:  Current Outpatient Medications  Medication Sig Dispense Refill   albuterol  (VENTOLIN  HFA) 108 (90 Base) MCG/ACT inhaler Inhale 2 puffs into the lungs every 6 (six) hours as needed for wheezing or shortness of breath. 8 g 6   ANORO ELLIPTA  62.5-25 MCG/ACT AEPB Inhale 1 puff by mouth once daily 60 each 0   apixaban  (ELIQUIS ) 5 MG TABS tablet Take 1 tablet (5 mg total) by mouth 2 (two) times daily. 60 tablet 0   Ascorbic Acid (VITAMIN C PO) Take 1 tablet by mouth daily.     azithromycin  (ZITHROMAX  Z-PAK) 250 MG tablet Take as directed 6 each 0   benzonatate  (TESSALON ) 100 MG capsule Take 1 capsule (100 mg total) by mouth 3 (three) times daily as needed. (Patient not taking: Reported on 01/18/2024) 30 capsule 2   calcium  carbonate (TUMS - DOSED IN MG ELEMENTAL CALCIUM ) 500 MG chewable tablet Chew 2 tablets by mouth daily.     Cholecalciferol (VITAMIN D3) 1000 UNITS CAPS Take 1,000 Units by mouth daily.     Coenzyme Q10 (CO Q 10 PO) Take 1 tablet by mouth daily.     Cyanocobalamin (VITAMIN B-12 PO) Take 1 tablet by mouth daily.     cyclobenzaprine (  FLEXERIL) 5 MG tablet Take 1 tablet (5 mg total) by mouth 3 (three) times daily as needed for muscle spasms. (Patient not taking: Reported on 01/18/2024) 30 tablet 0   dexamethasone  (DECADRON ) 4 MG tablet Take 2 tablets TWICE a day the day before, the day of, and the day after chemotherapy 40 tablet 2   dexamethasone  (DECADRON ) 4 MG tablet Take 2 tabs by mouth 2 times daily starting day before chemo. Then take 2 tabs daily for 2 days starting day after chemo. Take with food. 30 tablet 1   ezetimibe  (ZETIA ) 10 MG tablet Take 1 tablet (10  mg total) by mouth daily. 90 tablet 1   fluticasone  (FLONASE ) 50 MCG/ACT nasal spray Place 1-2 sprays into both nostrils daily. 16 g 6   furosemide (LASIX) 20 MG tablet 1 tablet p.o. daily for 1 week and then on as needed for swelling or shortness of breath after that. 30 tablet 0   gabapentin  (NEURONTIN ) 300 MG capsule Take 1 capsule by mouth twice daily (Patient not taking: Reported on 01/18/2024) 60 capsule 0   KRILL OIL PO Take 1 tablet by mouth daily.     lisinopril  (ZESTRIL ) 10 MG tablet Take 1 tablet (10 mg total) by mouth daily. 90 tablet 3   MAGNESIUM PO Take 1 tablet by mouth daily.     methylPREDNISolone  (MEDROL  DOSEPAK) 4 MG TBPK tablet Take with signs of chronic sinusitis and take as directed (Patient not taking: Reported on 01/18/2024) 1 each 1   metoCLOPramide  (REGLAN ) 10 MG tablet Take 1 tablet (10 mg total) by mouth 4 (four) times daily -  before meals and at bedtime. (Patient not taking: Reported on 01/18/2024) 56 tablet 0   metoprolol  tartrate (LOPRESSOR ) 25 MG tablet Take 1 tablet (25 mg total) by mouth once for 1 dose. Please take this medication 2 hours before CT 1 tablet 0   morphine  (MS CONTIN ) 15 MG 12 hr tablet Take 1 tablet (15 mg total) by mouth every 8 (eight) hours. 90 tablet 0   Multiple Vitamins-Minerals (CENTRUM SILVER PO) Take 1 tablet by mouth daily.     Multiple Vitamins-Minerals (ZINC PO) Take 1 tablet by mouth daily.     Naphazoline-Pheniramine (OPCON-A) 0.027-0.315 % SOLN Place 1 drop into both eyes daily as needed (redness).     nitroGLYCERIN  (NITROSTAT ) 0.4 MG SL tablet Place 1 tablet (0.4 mg total) under the tongue every 5 (five) minutes as needed for chest pain. 25 tablet 1   omeprazole  (PRILOSEC) 20 MG capsule Take 1 capsule (20 mg total) by mouth daily. 90 capsule 1   ondansetron  (ZOFRAN ) 8 MG tablet Take 1 tablet (8 mg total) by mouth every 8 (eight) hours as needed for nausea or vomiting. 30 tablet 1   oxyCODONE  (OXY IR/ROXICODONE ) 5 MG immediate release  tablet Take 1 tablet (5 mg total) by mouth every 4 (four) hours as needed for severe pain (pain score 7-10) or breakthrough pain. 90 tablet 0   potassium chloride  SA (KLOR-CON  M) 20 MEQ tablet Take 1 tablet (20 mEq total) by mouth daily. 6 tablet 0   prochlorperazine  (COMPAZINE ) 10 MG tablet Take 1 tablet (10 mg total) by mouth every 6 (six) hours as needed for nausea or vomiting. 30 tablet 0   prochlorperazine  (COMPAZINE ) 10 MG tablet Take 1 tablet (10 mg total) by mouth every 6 (six) hours as needed. (Patient not taking: Reported on 01/18/2024) 30 tablet 2   prochlorperazine  (COMPAZINE ) 10 MG tablet Take 1 tablet (10  mg total) by mouth every 6 (six) hours as needed for nausea or vomiting. 30 tablet 1   rosuvastatin  (CRESTOR ) 5 MG tablet Take 1 tablet (5 mg total) by mouth at bedtime. 90 tablet 1   sucralfate  (CARAFATE ) 1 g tablet Take 1 tablet (1 g total) by mouth 4 (four) times daily -  with meals and at bedtime. Crush and dissolve in 10 mL's of warm water prior to swallowing, take 20 min prior to meals 60 tablet 1   triamcinolone  0.1%-Eucerin equivalent 1:1 cream mixture Apply topically 3 (three) times daily as needed. 480 g 2   TURMERIC PO Take 1 tablet by mouth 3 (three) times a week.     No current facility-administered medications for this visit.   Facility-Administered Medications Ordered in Other Visits  Medication Dose Route Frequency Provider Last Rate Last Admin   0.9 %  sodium chloride  infusion   Intravenous Continuous Sherrod Sherrod, MD 10 mL/hr at 02/08/24 1140 New Bag at 02/08/24 1140    VITAL SIGNS: There were no vitals taken for this visit. There were no vitals filed for this visit.  Estimated body mass index is 19.71 kg/m as calculated from the following:   Height as of an earlier encounter on 02/08/24: 5' 9 (1.753 m).   Weight as of an earlier encounter on 02/08/24: 133 lb 8 oz (60.6 kg).     Latest Ref Rng & Units 02/08/2024   10:24 AM 01/18/2024    8:51 AM  12/28/2023    8:02 AM  CBC  WBC 4.0 - 10.5 K/uL 13.0  16.6  16.8   Hemoglobin 13.0 - 17.0 g/dL 9.1  9.8  89.7   Hematocrit 39.0 - 52.0 % 28.5  30.4  32.6   Platelets 150 - 400 K/uL 170  259  437        Latest Ref Rng & Units 02/08/2024   10:24 AM 01/18/2024    8:51 AM 12/28/2023    8:02 AM  CMP  Glucose 70 - 99 mg/dL 883  866  818   BUN 8 - 23 mg/dL 13  14  13    Creatinine 0.61 - 1.24 mg/dL 9.12  9.30  9.18   Sodium 135 - 145 mmol/L 143  142  141   Potassium 3.5 - 5.1 mmol/L 3.7  4.1  4.5   Chloride 98 - 111 mmol/L 108  108  108   CO2 22 - 32 mmol/L 26  23  26    Calcium  8.9 - 10.3 mg/dL 8.4  8.4  8.4   Total Protein 6.5 - 8.1 g/dL 6.2  6.5  6.5   Total Bilirubin 0.0 - 1.2 mg/dL <9.7  <9.7  0.2   Alkaline Phos 38 - 126 U/L 72  70  69   AST 15 - 41 U/L 22  22  15    ALT 0 - 44 U/L 15  12  15      PERFORMANCE STATUS (ECOG) : 1 - Symptomatic but completely ambulatory   Physical Exam General: NAD Cardiovascular: RRR Pulmonary: normal breathing pattern  Extremities: no edema, no joint deformities Skin: no rashes Neurological: AAO x4  IMPRESSION: Discussed the use of AI scribe software for clinical note transcription with the patient, who gave verbal consent to proceed.  History of Present Illness Matthewjames Petrasek. is a 68 year old male who was seen during infusion for symptom management follow-up. Denies nausea, vomiting, constipation, or diarrhea. Appetite fluctuates. Some days are better than others. Weight is  up to 133lbs from 128lbs. He continues to take things one day at a time. Has successfully completed radiation. Ongoing fatigue however he is trying to remain as active as possible, while listening to his body and taking breaks as needed.   Mr. Remmert reports his pain overall has been well controlled on current regimen. He is currently taking MS Contin  15mg  every 12 hours. He is taking Oxycodone  every 4 hours as needed, which significantly alleviates the pain. No adjustments  at this time.   We will continue to closely follow and support. All questions answered and support provided.  Goals of Care  06/18/22-  We discussed his current illness and what it means in the larger context of hsi on-going co-morbidities. Natural disease trajectory and expectations were discussed.  Mr. Ursua and his wife are realistic in their expectations and understanding of his incurable cancer. He knows all treatment is palliative focused. Wishes to continue taking things one day at a time focusing on his quality of life allowing him every opportunity to continue to thrive.   We discussed Her current illness and what it means in the larger context of Her on-going co-morbidities. Natural disease trajectory and expectations were discussed.  I discussed the importance of continued conversation with family and their medical providers regarding overall plan of care and treatment options, ensuring decisions are within the context of the patients values and GOCs. Assessment & Plan Appetite fluctuations and weight loss Intermittent appetite fluctuations with associated weight loss.  Weight is up to 133 pounds.   - Encourage consumption of small, frequent meals - Reinforce the importance of maintaining nutritional intake  Chronic cancer related pain management Pain management is well-controlled with current medications. - Continue MS Contin  15 mg every 12 hours. - Continue oxycodone  5 mg every 4 hours as needed. -Tylenol  every 8 hours as needed  Fatigue Reports feeling fatigue which is attributed to current radiation treatments. Hopeful for improvement in upcoming scans. Last radiation is tomorrow.    I will plan to follow-up with patient in 4-6 weeks. Sooner if needed.  Patient expressed understanding and was in agreement with this plan. He also understands that He can call the clinic at any time with any questions, concerns, or complaints.   Any controlled substances utilized were  prescribed in the context of palliative care. PDMP has been reviewed.   Visit consisted of counseling and education dealing with the complex and emotionally intense issues of symptom management and palliative care in the setting of serious and potentially life-threatening illness.  Levon Borer, AGPCNP-BC  Palliative Medicine Team/[facility]   "

## 2024-02-08 NOTE — Patient Instructions (Signed)
 [facility] - A DEPT OF [facility]  Discharge Instructions: Thank you for choosing [facility] to provide your oncology and hematology care.   If you have a lab appointment with the Cancer Center, please go directly to the Cancer Center and check in at the registration area.   Wear comfortable clothing and clothing appropriate for easy access to any Portacath or PICC line.   We strive to give you quality time with your provider. You may need to reschedule your appointment if you arrive late (15 or more minutes).  Arriving late affects you and other patients whose appointments are after yours.  Also, if you miss three or more appointments without notifying the office, you may be dismissed from the clinic at the provider's discretion.      For prescription refill requests, have your pharmacy contact our office and allow 72 hours for refills to be completed.    Today you received the following chemotherapy and/or immunotherapy agents: Cyramza, Taxotere      To help prevent nausea and vomiting after your treatment, we encourage you to take your nausea medication as directed.  BELOW ARE SYMPTOMS THAT SHOULD BE REPORTED IMMEDIATELY: *FEVER GREATER THAN 100.4 F (38 C) OR HIGHER *CHILLS OR SWEATING *NAUSEA AND VOMITING THAT IS NOT CONTROLLED WITH YOUR NAUSEA MEDICATION *UNUSUAL SHORTNESS OF BREATH *UNUSUAL BRUISING OR BLEEDING *URINARY PROBLEMS (pain or burning when urinating, or frequent urination) *BOWEL PROBLEMS (unusual diarrhea, constipation, pain near the anus) TENDERNESS IN MOUTH AND THROAT WITH OR WITHOUT PRESENCE OF ULCERS (sore throat, sores in mouth, or a toothache) UNUSUAL RASH, SWELLING OR PAIN  UNUSUAL VAGINAL DISCHARGE OR ITCHING   Items with * indicate a potential emergency and should be followed up as soon as possible or go to the Emergency Department if any problems should occur.  Please show the CHEMOTHERAPY ALERT CARD or  IMMUNOTHERAPY ALERT CARD at check-in to the Emergency Department and triage nurse.  Should you have questions after your visit or need to cancel or reschedule your appointment, please contact [facility] - A DE[facility]  Dept: [phone]  and follow the prompts.  Office hours are 8:00 a.m. to 4:30 p.m. Monday - Friday. Please note that voicemails left after 4:00 p.m. may not be returned until the following business day.  We are closed weekends and major holidays. You have access to a nurse at all times for urgent questions. Please call the main number to the clinic Dept: [phone] and follow the prompts.   For any non-urgent questions, you may also contact your provider using MyChart. We now offer e-Visits for anyone 67 and older to request care online for non-urgent symptoms. For details visit [email or web address].   Also download the MyChart app! Go to the app store, search "MyChart", open the app, select [place], and log in with your MyChart username and password.

## 2024-02-08 NOTE — Progress Notes (Signed)
 "     [facility] [phone]   [phone]  OFFICE PROGRESS NOTE  [name], MD [phone] [place] [address]  DIAGNOSIS: Stage IV (T2a, N2, M1 B) non-small cell lung cancer, squamous cell carcinoma presented with left upper lobe lung mass in addition to AP window lymphadenopathy and suspicious bone metastasis to the T8 and L4 vertebrae in addition to retroperitoneal metastatic soft tissue nodule diagnosed in April 2024.   Molecular studies by [email or web address] showed no actionable mutations and PD-L1 expression of 70%.  PRIOR THERAPY:  1) Palliative radiotherapy to the T8 and L4 metastatic bone disease. 2) Systemic chemotherapy with carboplatin  for AUC of 5, paclitaxel  175 Mg/M2 and Libtayo  (Cempilimab) 350 Mg IV every 3 weeks with Neulasta  support for 4 cycles  3)  Maintenance treatment with single agent Libtayo  (Cempilimab) 350 Mg IV every 3 weeks.  First dose September 08, 2022.  Status post 20 cycles.  4) Palliative radiation to the left paraspinal mass and right gluteal subcutaneous nodule under the care of Dr. Shannon. Last day scheduled for 01/19/24.   CURRENT THERAPY: 1) second line systemic chemotherapy with docetaxel  75 mg/M2 and Cyramza  10 mg/KG IV every 3 weeks.  First on 12/08/23. He is status post 3 cycles of treatment.  2) Xgeva  every 6 weeks   INTERVAL HISTORY: Albert Hardy. 68 y.o. male returns to the clinic today for follow-up visit. Discussed the use of AI scribe software for clinical note transcription with the patient, who gave verbal consent to proceed.  History of Present Illness Albert Hardy. is a 68 year old male with stage IV non-small cell lung cancer, malignant pleural effusion, and bone metastases who presents for evaluation and restaging with repeat CT imaging.  He was diagnosed in April 2024 with stage IV non-small cell lung cancer, squamous cell carcinoma, PD-L1 70%, without actionable mutation. Initial  treatment included carboplatin , paclitaxel , and Libtayo  for four cycles, followed by maintenance Libtayo  for twenty cycles, discontinued due to disease progression. He is currently receiving second-line docetaxel  and ramucirumab , with three cycles completed, and continues Xgeva  for bone metastases.  He denies significant dyspnea, chest pain, cough, or other respiratory complaints. He previously used furosemide but is not currently taking it.  He denies neuropathy, nausea, vomiting, or diarrhea. He describes two episodes of epistaxis following forceful nose blowing, possibly related to ramucirumab . No other new symptoms or complications reported.    MEDICAL HISTORY: Past Medical History:  Diagnosis Date   Aortic regurgitation    Aortic stenosis 04/04/2018   Atherosclerosis of native arteries of the extremities with intermittent claudication 09/08/2013   Atherosclerosis of native artery of extremity with intermittent claudication 09/08/2013   IMO SNOMED Dx Update Oct 2024     Bilateral impacted cerumen 10/13/2018   Bilateral sensorineural hearing loss 12/14/2018   Cancer (HCC)    lung   Dyspnea    Encounter for antineoplastic chemotherapy 08/18/2022   Encounter for antineoplastic immunotherapy 08/18/2022   Former moderate cigarette smoker (10-19 per day) 05/20/2012   Quit smoking Nov 2013 when hospitalized w/ HTN and chest pain(diagnosed w/ valvular heart disease)   GERD (gastroesophageal reflux disease)    Heart murmur    History of radiation therapy    Lumbar Spine, Thoracic Spine- 06/25/22-07/10/22- Dr. Lynwood Shannon   History of radiation therapy    Left lung-07/29/23-08/11/23- Dr. 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  MEDICATIONS:  Current Outpatient Medications  Medication Sig Dispense Refill   albuterol  (VENTOLIN  HFA) 108 (90 Base) MCG/ACT inhaler Inhale 2 puffs into the lungs every 6 (six) hours as needed for wheezing or shortness of breath. 8 g 6   ANORO ELLIPTA  62.5-25 MCG/ACT AEPB Inhale 1 puff by mouth once daily 60 each 0   apixaban  (ELIQUIS ) 5 MG TABS tablet Take 1 tablet (5 mg total) by mouth 2 (two) times daily. 60 tablet 0   Ascorbic Acid (VITAMIN C PO) Take 1 tablet by mouth daily.     azithromycin  (ZITHROMAX  Z-PAK) 250 MG tablet Take as directed 6 each 0   benzonatate  (TESSALON ) 100 MG capsule Take 1 capsule (100 mg total) by mouth 3 (three) times daily as needed. (Patient not taking: Reported on 01/18/2024) 30 capsule 2   Cholecalciferol (VITAMIN D3) 1000 UNITS CAPS Take 1,000 Units by mouth daily.     Coenzyme Q10 (CO Q 10 PO) Take 1 tablet by mouth daily.     Cyanocobalamin (VITAMIN B-12 PO) Take 1 tablet by mouth daily.     cyclobenzaprine (FLEXERIL) 5 MG tablet Take 1 tablet (5 mg total) by mouth 3 (three) times daily as needed for muscle spasms. (Patient not taking: Reported on 01/18/2024) 30 tablet 0   dexamethasone  (DECADRON ) 4 MG tablet Take 2 tablets TWICE a day the day before, the day of, and the day after chemotherapy 40 tablet 2   dexamethasone  (DECADRON ) 4 MG tablet Take 2 tabs by mouth 2 times daily starting day before chemo. Then take 2 tabs daily for 2 days starting day after chemo. Take with food. 30 tablet 1   ezetimibe  (ZETIA ) 10 MG tablet Take 1 tablet (10 mg total) by mouth daily. 90 tablet 1   fluticasone  (FLONASE ) 50 MCG/ACT nasal spray Place 1-2 sprays into both nostrils daily. 16 g 6   gabapentin  (NEURONTIN ) 300 MG capsule Take 1 capsule by mouth twice daily (Patient not taking: Reported on 01/18/2024) 60 capsule 0   KRILL OIL PO Take 1 tablet by mouth daily.     lisinopril  (ZESTRIL ) 10  MG tablet Take 1 tablet (10 mg total) by mouth daily. 90 tablet 3   MAGNESIUM PO Take 1 tablet by mouth daily.     methylPREDNISolone  (MEDROL  DOSEPAK) 4 MG TBPK tablet Take with signs of chronic sinusitis and take as directed (Patient not taking: Reported on 01/18/2024) 1 each 1   metoCLOPramide  (REGLAN ) 10 MG tablet Take 1 tablet (10 mg total) by mouth 4 (four) times daily -  before meals and at bedtime. (Patient not taking: Reported on 01/18/2024) 56 tablet 0   metoprolol  tartrate (LOPRESSOR ) 25 MG tablet Take 1 tablet (25 mg total) by mouth once for 1 dose. Please take this medication 2 hours before CT 1 tablet 0   morphine  (MS CONTIN ) 15 MG 12 hr tablet Take 1 tablet (15 mg total) by mouth every 8 (eight) hours. 90 tablet 0   Multiple Vitamins-Minerals (CENTRUM SILVER PO) Take 1 tablet by mouth daily.     Multiple Vitamins-Minerals (ZINC PO) Take 1 tablet by mouth daily.     Naphazoline-Pheniramine (OPCON-A) 0.027-0.315 % SOLN Place  1 drop into both eyes daily as needed (redness).     nitroGLYCERIN  (NITROSTAT ) 0.4 MG SL tablet Place 1 tablet (0.4 mg total) under the tongue every 5 (five) minutes as needed for chest pain. 25 tablet 1   omeprazole  (PRILOSEC) 20 MG capsule Take 1 capsule (20 mg total) by mouth daily. 90 capsule 1   ondansetron  (ZOFRAN ) 8 MG tablet Take 1 tablet (8 mg total) by mouth every 8 (eight) hours as needed for nausea or vomiting. 30 tablet 1   oxyCODONE  (OXY IR/ROXICODONE ) 5 MG immediate release tablet Take 1 tablet (5 mg total) by mouth every 4 (four) hours as needed for severe pain (pain score 7-10) or breakthrough pain. 90 tablet 0   potassium chloride  SA (KLOR-CON  M) 20 MEQ tablet Take 1 tablet (20 mEq total) by mouth daily. 6 tablet 0   prochlorperazine  (COMPAZINE ) 10 MG tablet Take 1 tablet (10 mg total) by mouth every 6 (six) hours as needed for nausea or vomiting. 30 tablet 0   prochlorperazine  (COMPAZINE ) 10 MG tablet Take 1 tablet (10 mg total) by mouth every 6 (six)  hours as needed. (Patient not taking: Reported on 01/18/2024) 30 tablet 2   prochlorperazine  (COMPAZINE ) 10 MG tablet Take 1 tablet (10 mg total) by mouth every 6 (six) hours as needed for nausea or vomiting. 30 tablet 1   rosuvastatin  (CRESTOR ) 5 MG tablet Take 1 tablet (5 mg total) by mouth at bedtime. 90 tablet 1   sucralfate  (CARAFATE ) 1 g tablet Take 1 tablet (1 g total) by mouth 4 (four) times daily -  with meals and at bedtime. Crush and dissolve in 10 mL's of warm water prior to swallowing, take 20 min prior to meals 60 tablet 1   triamcinolone  0.1%-Eucerin equivalent 1:1 cream mixture Apply topically 3 (three) times daily as needed. 480 g 2   TURMERIC PO Take 1 tablet by mouth 3 (three) times a week.     No current facility-administered medications for this visit.    SURGICAL HISTORY:  Past Surgical History:  Procedure Laterality Date   BRONCHIAL BIOPSY  05/26/2022   Procedure: BRONCHIAL BIOPSIES;  Surgeon: Brenna Adine CROME, DO;  Location: [facility];  Service: Pulmonary;;   BRONCHIAL BRUSHINGS  05/26/2022   Procedure: BRONCHIAL BRUSHINGS;  Surgeon: Brenna Adine CROME, DO;  Location: [facility];  Service: Pulmonary;;   NO PAST SURGERIES     VIDEO BRONCHOSCOPY  05/26/2022   Procedure: VIDEO BRONCHOSCOPY WITHOUT FLUORO;  Surgeon: Brenna Adine CROME, DO;  Location: [facility];  Service: Pulmonary;;    REVIEW OF SYSTEMS:  Constitutional: positive for fatigue Eyes: negative Ears, nose, mouth, throat, and face: positive for hoarseness Respiratory: positive for cough and dyspnea on exertion Cardiovascular: negative Gastrointestinal: negative Genitourinary:negative Integument/breast: negative Hematologic/lymphatic: negative Musculoskeletal:negative Neurological: negative Behavioral/Psych: negative Endocrine: negative Allergic/Immunologic: negative   PHYSICAL EXAMINATION: General appearance: alert, cooperative, fatigued, and no distress Head: Normocephalic, without obvious  abnormality, atraumatic Neck: no adenopathy, no JVD, supple, symmetrical, trachea midline, and thyroid  not enlarged, symmetric, no tenderness/mass/nodules Lymph nodes: Cervical, supraclavicular, and axillary nodes normal. Resp: diminished breath sounds LLL and RLL and dullness to percussion LLL and RLL Back: symmetric, no curvature. ROM normal. No CVA tenderness. Cardio: regular rate and rhythm, S1, S2 normal, no murmur, click, rub or gallop GI: soft, non-tender; bowel sounds normal; no masses,  no organomegaly Extremities: extremities normal, atraumatic, no cyanosis or edema Neurologic: Alert and oriented X 3, normal strength and tone. Normal symmetric reflexes. Normal coordination and  gait  ECOG PERFORMANCE STATUS: 1 - Symptomatic but completely ambulatory  Blood pressure (!) 155/69, pulse 62, temperature 97.6 F (36.4 C), temperature source Temporal, resp. rate 17, height 5' 9 (1.753 m), weight 133 lb 8 oz (60.6 kg), SpO2 99%.  LABORATORY DATA: Lab Results  Component Value Date   WBC 13.0 (H) 02/08/2024   HGB 9.1 (L) 02/08/2024   HCT 28.5 (L) 02/08/2024   MCV 94.4 02/08/2024   PLT 170 02/08/2024      Chemistry      Component Value Date/Time   NA 142 01/18/2024 0851   NA 146 (H) 01/05/2023 1114   K 4.1 01/18/2024 0851   CL 108 01/18/2024 0851   CO2 23 01/18/2024 0851   BUN 14 01/18/2024 0851   BUN 11 01/05/2023 1114   CREATININE 0.69 01/18/2024 0851   CREATININE 1.07 08/27/2014 1511      Component Value Date/Time   CALCIUM  8.4 (L) 01/18/2024 0851   ALKPHOS 70 01/18/2024 0851   AST 22 01/18/2024 0851   ALT 12 01/18/2024 0851   BILITOT <0.2 01/18/2024 0851       RADIOGRAPHIC STUDIES: No results found.    ASSESSMENT AND PLAN: This is a very pleasant 68 years old African-American male with Stage IV (T2a, N2, M1b) non-small cell lung cancer, squamous cell carcinoma presented with left upper lobe lung mass in addition to AP window lymphadenopathy and suspicious bone  metastasis to the T8 and L4 vertebrae as well as right retroperitoneal metastatic nodule diagnosed in April 2024.  Molecular studies by [email or web address] showed no actionable mutations and PD-L1 expression of 70%. The patient underwent palliative combination of systemic chemotherapy with carboplatin  for AUC of 5, paclitaxel  175 Mg/M2 and immunotherapy with Libtayo  (Cempilimab) 350 Mg IV every 3 weeks with Neulasta  support for 4 cycles followed by maintenance treatment with Libtayo  (Cempilimab) of the patient has no disease progression after cycle #4.  He is status post 4 cycles. He is currently undergoing maintenance treatment with single agent Libtayo  (Cempilimab) 350 Mg IV every 3 weeks status post 20 cycles.  He has been tolerating this treatment fairly well. He is also status post palliative radiotherapy to the left hilar mass under the care of Dr. Shannon completed August 11, 2023. He is currently undergoing second line treatment with docetaxel  75 MGs/M2 and ramucirumab  10 mg/KG every 3 weeks status post 3 cycles with Neulasta  support.  He is also on Xgeva  for the bone metastasis. The patient has been tolerating this treatment fairly well. He had repeat CT scan of the chest, abdomen and pelvis performed recently.  The final report is still pending but I personally and independently reviewed the scan images and discussed the result with the patient today.  I see some improvement of his disease especially in the mediastinal lymph node and the left hilar mass and the soft tissue metastasis but there was development of small to moderate bilateral pleural effusion left more than right.  I will wait for the final report for confirmation. Assessment and Plan Assessment & Plan Stage IV non-small cell lung cancer, squamous cell carcinoma Metastatic squamous cell carcinoma of the lung, diagnosed April 2024, previously treated with platinum doublet chemotherapy and immunotherapy, with progression on maintenance Libtayo .  Currently receiving second-line docetaxel  and ramucirumab , with three cycles completed. Preliminary CT imaging indicates interval improvement in the primary lung mass and mediastinal lymphadenopathy.  No evidence of disease flare or new metastatic symptoms. - Continued docetaxel  and ramucirumab  every three weeks. - Reviewed  recent CT images; awaiting official radiology report. - Maintained current chemotherapy regimen due to evidence of response on preliminary review. - Scheduled follow-up in three weeks. - Advised notification if official scan report differs from preliminary review.  Malignant pleural effusion Small to moderate bilateral pleural effusions, left greater than right, likely secondary to malignancy and/or treatment. He remains asymptomatic regarding dyspnea. No immediate procedural intervention required, but risk of symptomatic progression persists. - Prescribed Lasix once daily for one week, then as needed for dyspnea. - Instructed to monitor for increased dyspnea and to notify if symptoms worsen, as thoracentesis may be considered if effusion becomes symptomatic.  Bone metastases from lung cancer Osseous metastatic disease from lung cancer, managed with Xgeva . No new bone pain or skeletal-related events. No evidence of complications from bone metastases. - Continued Xgeva  for bone metastases.  Hypertension Chronic hypertension. Diuretic therapy with Lasix may lower blood pressure; risk of hypotension, especially during diuretic use, discussed. No current symptoms of hypotension. - Advised blood pressure monitoring while on Lasix. The patient was advised to call immediately if he has any other concerning symptoms in the interval. The patient voices understanding of current disease status and treatment options and is in agreement with the current care plan.  All questions were answered. The patient knows to call the clinic with any problems, questions or concerns. We can  certainly see the patient much sooner if necessary.  The total time spent in the appointment was 30 minutes including review of chart and various tests results, discussions about plan of care and coordination of care plan .   Disclaimer: This note was dictated with voice recognition software. Similar sounding words can inadvertently be transcribed and may not be corrected upon review.        "

## 2024-02-08 NOTE — Progress Notes (Signed)
 Per Dr Sherrod, ok to proceed w/ Xgeva  today w/ Corr Ca = 8.88. Pt should take Ca++ 1000 mg daily.  Pt is aware and he will restart Ca++.  Tonji Elliff, Pharm.D., CPP 02/08/2024@11 :49 AM

## 2024-02-11 ENCOUNTER — Inpatient Hospital Stay: Attending: Internal Medicine

## 2024-02-11 VITALS — BP 141/68 | HR 98 | Temp 97.8°F | Resp 17

## 2024-02-11 DIAGNOSIS — Z5189 Encounter for other specified aftercare: Secondary | ICD-10-CM | POA: Insufficient documentation

## 2024-02-11 DIAGNOSIS — C3492 Malignant neoplasm of unspecified part of left bronchus or lung: Secondary | ICD-10-CM

## 2024-02-11 DIAGNOSIS — Z87891 Personal history of nicotine dependence: Secondary | ICD-10-CM | POA: Insufficient documentation

## 2024-02-11 DIAGNOSIS — Z5112 Encounter for antineoplastic immunotherapy: Secondary | ICD-10-CM | POA: Insufficient documentation

## 2024-02-11 DIAGNOSIS — C7951 Secondary malignant neoplasm of bone: Secondary | ICD-10-CM | POA: Diagnosis not present

## 2024-02-11 DIAGNOSIS — Z5111 Encounter for antineoplastic chemotherapy: Secondary | ICD-10-CM | POA: Insufficient documentation

## 2024-02-11 DIAGNOSIS — C3412 Malignant neoplasm of upper lobe, left bronchus or lung: Secondary | ICD-10-CM | POA: Insufficient documentation

## 2024-02-11 MED ORDER — PEGFILGRASTIM-FPGK 6 MG/0.6ML ~~LOC~~ SOSY
6.0000 mg | PREFILLED_SYRINGE | Freq: Once | SUBCUTANEOUS | Status: AC
  Administered 2024-02-11: 6 mg via SUBCUTANEOUS
  Filled 2024-02-11: qty 0.6

## 2024-02-21 NOTE — Progress Notes (Signed)
 [facility] OFFICE PROGRESS NOTE  Albert Hardy [name], MD [phone] [place] [address]  DIAGNOSIS: Stage IV (T2a, N2, M1 B) non-small cell lung cancer, squamous cell carcinoma presented with left upper lobe lung mass in addition to AP window lymphadenopathy and suspicious bone metastasis to the T8 and L4 vertebrae in addition to retroperitoneal metastatic soft tissue nodule diagnosed in April 2024.    Molecular studies by [id number] showed no actionable mutations and PD-L1 expression of 70%.  PRIOR THERAPY:  1) Palliative radiotherapy to the T8 and L4 metastatic bone disease. 2) Systemic chemotherapy with carboplatin  for AUC of 5, paclitaxel  175 Mg/M2 and Libtayo  (Cempilimab) 350 Mg IV every 3 weeks with Neulasta  support for 4 cycles  3) Palliative radiation to the left hilar mass under the care of Dr. Shannon. Last dose on 08/11/23.  4)  Maintenance treatment with single agent Libtayo  (Cempilimab) 350 Mg IV every 3 weeks.  First dose September 08, 2022.  Status post 20 cycles.  5) Palliative radiation to the left paraspinal mass and right gluteal subcutaneous nodule under the care of Dr. Shannon. Last day scheduled for 01/19/24.   CURRENT THERAPY:  1) docetaxel  and Cyramza  IV every 3 weeks.  First on 12/08/23. He is status post 4 cycles of treatment.  2) Xgeva  every 6 weeks  INTERVAL HISTORY: Albert Hardy. 69 y.o. male returns to the clinic today for a follow up visit. The patient was last seen in the clinc 3 weeks ago by Dr. Sherrod.   The patient is currently on chemotherapy with docetaxel  and Cyramza .  He is status post 4 cycles of treatment and seems to be tolerating it well.  He tolerates chemotherapy with manageable side effects. He experiences frequent weakness and decreased appetite, particularly in the first week after treatment, and has intermittent periods of poor appetite and gastric irritation lasting two to three days, during which oral intake is limited.  He sometimes has difficulty recognizing nausea but finds antiemetic therapy effective when used as prescribed. He denies constant abdominal pain but notes occasional, non-persistent discomfort. He takes omeprazole  and calcium  carbonate daily for gastric protection. He denies diarrhea, fever, or upper respiratory symptoms. He experiences intermittent chills, which he attributes to cold weather and poor home insulation. He also has anemia but it is improving today.   He was given lasix  for swelling but is unsure how to take this. Therefore, he has not taken it. He denies chest pain, skin infections, dysuria, or other urinary symptoms.  He uses Senokot for constipation which he did not express any concern with constipation today. He is scheduled to see nutrition and palliative care.    He is here today for evaluation repeat blood work before undergoing cycle #5.  MEDICAL HISTORY: Past Medical History:  Diagnosis Date   Aortic regurgitation    Aortic stenosis 04/04/2018   Atherosclerosis of native arteries of the extremities with intermittent claudication 09/08/2013   Atherosclerosis of native artery of extremity with intermittent claudication 09/08/2013   IMO SNOMED Dx Update Oct 2024     Bilateral impacted cerumen 10/13/2018   Bilateral sensorineural hearing loss 12/14/2018   Cancer (HCC)    lung   Dyspnea    Encounter for antineoplastic chemotherapy 08/18/2022   Encounter for antineoplastic immunotherapy 08/18/2022   Former moderate cigarette smoker (10-19 per day) 05/20/2012   Quit smoking Nov 2013 when hospitalized w/ HTN and chest pain(diagnosed w/ valvular heart disease)   GERD (gastroesophageal reflux  disease)    Heart murmur    History of radiation therapy    Lumbar Spine, Thoracic Spine- 06/25/22-07/10/22- Dr. Lynwood Nasuti   History of radiation therapy    Left lung-07/29/23-08/11/23- Dr. 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  MEDICATIONS:  Current Outpatient Medications  Medication Sig Dispense Refill   albuterol  (VENTOLIN  HFA) 108 (90 Base) MCG/ACT inhaler Inhale 2 puffs into the lungs every 6 (six) hours as needed for wheezing or shortness of breath. 8 g 6   ANORO ELLIPTA  62.5-25 MCG/ACT AEPB Inhale 1 puff by mouth once daily 60 each 0   apixaban  (ELIQUIS ) 5 MG TABS tablet Take 1 tablet (5 mg total) by mouth 2 (two) times daily. 60 tablet 0   Ascorbic Acid (VITAMIN C PO) Take 1 tablet by mouth daily.     azithromycin  (ZITHROMAX  Z-PAK) 250 MG tablet Take as directed 6 each 0   benzonatate  (TESSALON ) 100 MG capsule Take 1 capsule (100 mg total) by mouth 3 (three) times daily as needed. (Patient not taking: Reported on 01/18/2024) 30 capsule 2   calcium  carbonate (TUMS - DOSED IN MG ELEMENTAL CALCIUM ) 500 MG chewable tablet Chew 2 tablets by mouth daily.     Cholecalciferol (VITAMIN D3) 1000 UNITS CAPS Take 1,000 Units by mouth daily.     Coenzyme Q10 (CO Q 10 PO) Take 1 tablet by mouth daily.     Cyanocobalamin (VITAMIN B-12 PO) Take 1 tablet by mouth daily.     cyclobenzaprine (FLEXERIL) 5 MG tablet Take 1 tablet (5 mg total) by mouth 3 (three) times daily as needed for muscle spasms. (Patient not taking: Reported on 01/18/2024) 30 tablet 0   dexamethasone  (DECADRON ) 4 MG tablet Take 2 tabs by mouth 2 times daily starting day before chemo. Then take 2 tabs daily for 2 days starting day after chemo. Take with food. 30 tablet 1   dexamethasone  (DECADRON ) 4 MG tablet Take 2 tablets TWICE a day the day before, the day of, and the day after chemotherapy 40 tablet 2   ezetimibe  (ZETIA ) 10 MG tablet Take 1 tablet (10 mg total)  by mouth daily. 90 tablet 1   fluticasone  (FLONASE ) 50 MCG/ACT nasal spray Place 1-2 sprays into both nostrils daily. 16 g 6   furosemide  (LASIX ) 20 MG tablet 1 tablet p.o. daily for 1 week and then on as needed for swelling or shortness of breath after that. 30 tablet 0   gabapentin  (NEURONTIN ) 300 MG capsule Take 1 capsule by mouth twice daily (Patient not taking: Reported on 01/18/2024) 60 capsule 0   KRILL OIL PO Take 1 tablet by mouth daily.     lisinopril  (ZESTRIL ) 10 MG tablet Take 1 tablet (10 mg total) by mouth daily. 90 tablet 3   MAGNESIUM PO Take 1 tablet by mouth daily.     methylPREDNISolone  (MEDROL  DOSEPAK) 4 MG TBPK tablet Take with signs of chronic sinusitis and take as directed (Patient not taking: Reported on 01/18/2024) 1 each 1   metoCLOPramide  (REGLAN ) 10 MG tablet Take 1 tablet (10 mg total) by mouth 4 (four) times daily -  before meals and at bedtime. (Patient not taking: Reported on 01/18/2024) 56 tablet 0   metoprolol  tartrate (LOPRESSOR ) 25 MG tablet Take 1 tablet (25 mg total) by mouth once for 1 dose. Please take this medication 2 hours before CT 1 tablet 0   morphine  (MS CONTIN ) 15 MG 12 hr tablet Take 1 tablet (15 mg total) by mouth every 8 (eight) hours. 90 tablet 0   Multiple Vitamins-Minerals (CENTRUM SILVER PO) Take 1 tablet by mouth daily.     Multiple Vitamins-Minerals (ZINC PO) Take 1 tablet by mouth daily.     Naphazoline-Pheniramine (OPCON-A) 0.027-0.315 % SOLN Place 1 drop into both eyes daily as needed (redness).     nitroGLYCERIN  (NITROSTAT ) 0.4 MG SL tablet Place 1 tablet (0.4 mg total) under the tongue every 5 (five) minutes as needed for chest pain. 25 tablet 1   omeprazole  (PRILOSEC) 20 MG capsule Take 1 capsule (20 mg total) by mouth daily. 90 capsule 1   ondansetron  (ZOFRAN ) 8 MG tablet Take 1 tablet (8 mg total) by mouth every 8 (eight) hours as needed for nausea or vomiting. 30 tablet 1   oxyCODONE  (OXY IR/ROXICODONE ) 5 MG immediate release tablet Take  1 tablet (5 mg total) by mouth every 4 (four) hours as needed for severe pain (pain score 7-10) or breakthrough pain. 90 tablet 0   potassium chloride  SA (KLOR-CON  M) 20 MEQ tablet Take 1 tablet (20 mEq total) by mouth daily. 6 tablet 0   prochlorperazine  (COMPAZINE ) 10 MG tablet Take 1 tablet (10 mg total) by mouth every 6 (six) hours as needed for nausea or vomiting. 30 tablet 0   prochlorperazine  (COMPAZINE ) 10 MG tablet Take 1 tablet (10 mg total) by mouth every 6 (six) hours as needed. (Patient not taking: Reported on 01/18/2024) 30 tablet 2   prochlorperazine  (COMPAZINE ) 10 MG tablet Take 1 tablet (10 mg total) by mouth every 6 (six) hours as needed for nausea or vomiting. 30 tablet 1   rosuvastatin  (CRESTOR ) 5 MG tablet Take 1 tablet (5 mg total) by mouth at bedtime. 90 tablet 1   sucralfate  (CARAFATE ) 1 g tablet Take 1 tablet (1 g total) by mouth 4 (four) times daily -  with meals and at bedtime. Crush and dissolve in 10 mL's of warm water prior to swallowing, take 20 min prior to meals 60 tablet 1   triamcinolone  0.1%-Eucerin equivalent 1:1 cream mixture Apply topically 3 (three) times daily as needed. 480 g 2   TURMERIC PO Take 1 tablet by mouth 3 (three) times a week.     No current facility-administered medications for this visit.   Facility-Administered Medications Ordered in Other Visits  Medication Dose Route Frequency Provider Last Rate Last Admin   0.9 %  sodium chloride  infusion   Intravenous Continuous Sherrod Sherrod, MD 10 mL/hr at 02/29/24 1236 New Bag at 02/29/24 1236   acetaminophen  (TYLENOL ) tablet 650 mg  650 mg Oral Once Mohamed, Mohamed, MD       dexamethasone  (DECADRON ) injection 10 mg  10 mg Intravenous Once Mohamed, Mohamed, MD       diphenhydrAMINE  (BENADRYL ) injection 50 mg  50 mg Intravenous Once Mohamed, Mohamed, MD       DOCEtaxel  (TAXOTERE ) 120 mg in sodium chloride  0.9 % 250 mL chemo infusion  75 mg/m2 (Treatment Plan Recorded) Intravenous Once Mohamed,  Mohamed, MD       ramucirumab  (CYRAMZA ) 600 mg in sodium chloride  0.9 % 190 mL chemo infusion  10 mg/kg (Order-Specific)  Intravenous Once Sherrod Sherrod, MD        SURGICAL HISTORY:  Past Surgical History:  Procedure Laterality Date   BRONCHIAL BIOPSY  05/26/2022   Procedure: BRONCHIAL BIOPSIES;  Surgeon: Brenna Adine CROME, DO;  Location: [facility];  Service: Pulmonary;;   BRONCHIAL BRUSHINGS  05/26/2022   Procedure: BRONCHIAL BRUSHINGS;  Surgeon: Brenna Adine CROME, DO;  Location: [facility];  Service: Pulmonary;;   NO PAST SURGERIES     VIDEO BRONCHOSCOPY  05/26/2022   Procedure: VIDEO BRONCHOSCOPY WITHOUT FLUORO;  Surgeon: Brenna Adine CROME, DO;  Location: [facility];  Service: Pulmonary;;    REVIEW OF SYSTEMS:   Review of Systems  Constitutional: Positive for cold intolerance and fatigue and intermittently poor appetite. Negative for chills, fever and unexpected weight change.  HENT: Positive for hoarseness. Negative for mouth sores, nosebleeds, sore throat and trouble swallowing.   Eyes: Negative for eye problems and icterus.  Respiratory: Positive for shortness of breath (stable) and intermittent cough. Negative for hemoptysis and wheezing.   Cardiovascular: Negative for chest pain. Stable swelling.  Gastrointestinal: Positive for intermittent nausea and abdominal discomfort. Negative for abdominal pain, constipation, diarrhea, and vomiting.  Genitourinary: Negative for bladder incontinence, difficulty urinating, dysuria, frequency and hematuria.   Musculoskeletal: Negative for back pain, gait problem, neck pain and neck stiffness.  Skin: Negative for itching and rash.  Neurological: Negative for dizziness, extremity weakness, gait problem, headaches, light-headedness and seizures.  Hematological: Negative for adenopathy. Does not bruise/bleed easily.  Psychiatric/Behavioral: Negative for confusion, depression and sleep disturbance. The patient is not nervous/anxious.      PHYSICAL EXAMINATION:  Blood pressure (!) 146/72, pulse (!) 102, temperature 98.5 F (36.9 C), temperature source Temporal, resp. rate 17, height 5' 9 (1.753 m), weight 132 lb (59.9 kg), SpO2 100%.  ECOG PERFORMANCE STATUS: 1  Physical Exam  Constitutional: Oriented to person, place, and time and thin appearing male, and in no distress. HENT:  Head: Normocephalic and atraumatic.  Mouth/Throat: Oropharynx is clear and moist. No oropharyngeal exudate.  Eyes: Conjunctivae are normal. Right eye exhibits no discharge. Left eye exhibits no discharge. No scleral icterus.  Neck: Normal range of motion. Neck supple.  Cardiovascular: Normal rate, regular rhythm, normal heart sounds and intact distal pulses.   Pulmonary/Chest: Effort normal and breath sounds normal. No respiratory distress. No wheezes. No rales.  Abdominal: Soft. Bowel sounds are normal. Exhibits no distension and no mass. There is no tenderness.  Musculoskeletal: Normal range of motion. Exhibits no edema.  Lymphadenopathy:    No cervical adenopathy.  Neurological: Alert and oriented to person, place, and time. Exhibits muscle wasting. Gait normal. Coordination normal.  Skin: Skin is warm and dry. No rash noted. Not diaphoretic. No erythema. No pallor.  Psychiatric: Mood, memory and judgment normal.  Vitals reviewed.  LABORATORY DATA: Lab Results  Component Value Date   WBC 14.5 (H) 02/29/2024   HGB 11.1 (L) 02/29/2024   HCT 35.2 (L) 02/29/2024   MCV 94.9 02/29/2024   PLT 265 02/29/2024      Chemistry      Component Value Date/Time   NA 142 02/29/2024 1048   NA 146 (H) 01/05/2023 1114   K 3.3 (L) 02/29/2024 1048   CL 105 02/29/2024 1048   CO2 23 02/29/2024 1048   BUN 7 (L) 02/29/2024 1048   BUN 11 01/05/2023 1114   CREATININE 0.84 02/29/2024 1048   CREATININE 1.07 08/27/2014 1511      Component Value Date/Time   CALCIUM  8.3 (L)  02/29/2024 1048   ALKPHOS 97 02/29/2024 1048   AST 31 02/29/2024 1048   ALT  15 02/29/2024 1048   BILITOT 0.3 02/29/2024 1048       RADIOGRAPHIC STUDIES:  CT CHEST ABDOMEN PELVIS W CONTRAST Result Date: 02/09/2024 CLINICAL DATA:  Lung cancer. Radiation therapy completed 2 weeks ago. Ongoing chemotherapy. * Tracking Code: BO * EXAM: CT CHEST, ABDOMEN, AND PELVIS WITH CONTRAST TECHNIQUE: Multidetector CT imaging of the chest, abdomen and pelvis was performed following the standard protocol during bolus administration of intravenous contrast. RADIATION DOSE REDUCTION: This exam was performed according to the departmental dose-optimization program which includes automated exposure control, adjustment of the mA and/or kV according to patient size and/or use of iterative reconstruction technique. CONTRAST:  OMNIPAQUE  IOHEXOL  300 MG/ML  SOLN COMPARISON:  11/23/2023. FINDINGS: CT CHEST FINDINGS Cardiovascular: Atherosclerotic calcification of the aorta and aortic valve. Heart is normal in size with left ventricular dilatation. New moderate pericardial effusion. Mediastinum/Nodes: Improving mediastinal and hilar adenopathy. Index low right paratracheal lymph node measures 11 mm, previously 1.6 cm. No axillary adenopathy. Esophagus is grossly unremarkable. Lungs/Pleura: Centrilobular and paraseptal emphysema. Small right pleural effusion with minimal dependent atelectasis in the right lower lobe. Previously seen suprahilar left upper lobe nodule has decreased in size, now measuring approximately 1.8 x 2.0 cm, previously 2.5 x 2.8 cm. Surrounding parenchymal retraction and architectural distortion, more organized than on 11/23/2023. Moderate left pleural effusion. Debris in the left mainstem and left lower lobe bronchi. Musculoskeletal: Mixed lytic and sclerotic lesion in the T8 vertebral body, as before. CT ABDOMEN PELVIS FINDINGS Hepatobiliary: Insert liver and Pancreas: Negative. Spleen: Negative. Adrenals/Urinary Tract: Adrenal glands and kidneys are unremarkable. Ureters are  decompressed. Bladder is somewhat thick-walled. Stomach/Bowel: Lateral gastric wall thickening. Stomach, small bowel, appendix and colon are otherwise grossly unremarkable. Apparent thickening at the ileocecal valve, not seen on 11/23/2023 and likely due to under distension and possible fecal material. Vascular/Lymphatic: Atherosclerotic calcification of the aorta. No pathologically enlarged lymph nodes. Reproductive: Prostate is minimally prominent. Other: Small to moderate pelvic free fluid. Musculoskeletal: A mildly heterogeneous and rather faint left paraspinal mass at the L2-3 level measures approximately 4.0 x 5.8 cm (2/72), previously much better seen, 5.2 x 6.1 cm. Lytic L4 metastasis with surrounding sclerosis, unchanged. Degenerative changes in the spine. Mild levoconvex scoliosis. IMPRESSION: 1. Interval response to therapy as evidenced by decrease in size of the primary left upper lobe bronchogenic carcinoma, mediastinal adenopathy and left paraspinal mass at the L2-3 level. Associated evolving changes of radiation therapy in the medial left hemithorax. 2. Stable osseous metastatic disease. 3. New moderate pericardial effusion. 4. Small right and moderate left pleural effusions. 5. Small to moderate pelvic free fluid. 6. Lateral gastric wall thickening. 7. Prostate is minimally prominent. 8.  Aortic atherosclerosis (ICD10-I70.0). 9.  Emphysema (ICD10-J43.9). Electronically Signed   By: Newell Eke M.D.   On: 02/09/2024 13:06     ASSESSMENT/PLAN:  This is a very pleasant 69 year old African-American male with stage IV (T2a, N2, M1 B non-small cell lung cancer, squamous cell carcinoma.  He presented with a left upper lobe lung mass in addition to AP window lymphadenopathy and suspicious for metastases to T8 and L4 vertebrae as well as right retroperitoneal metastatic nodule.  He was diagnosed in April 2024.   His molecular studies by [email or web address] showed no actionable mutation.  His PD-L1  expression is 70%.   He underwent palliative radiation to the metastatic bone lesions.   He also receives Xgeva   every 6 weeks.     He is underwent palliative systemic chemotherapy and immunotherapy with carboplatin  for AUC of 5, paclitaxel  175 mg/m, and immunotherapy with Libtayo  350 mg IV every 3 weeks with Neulasta  support.  He is status post 4 cycles.     He started maintenance immunotherapy with Libtayo . He is status post 20 cycles of maintenance.    His restaging CT scan from June 2025 showed increased size of the left hilar mass which causes central obstruction of a left upper lobe bronchus, increase in the left hilar and mediastinal lymph nodes, and increased in numerous ground glass pulmonary nodules which are nonspecific.  Dr. Sherrod recommended referral to radiation to discuss palliative radiation to the obstructive lung mass.    He underwent radiation to the left hilar mass which was completed on 08/11/23.    He had a CT scan which showed progression.    Dr. Sherrod recommended docetaxel  and Cyramza . He is status post 4 cycles with on 12/08/23. He seems to be tolerating this fairly well. His scan from last month showed positive response to treatment. Reviewed it also showed pericardial effusion and to make sure he continues to follow with his cardiologist who he sees once a year.     Labs were reviewed. He will proceed with cycle 5 today as scheduled. He is ok to treat with his HR of 102 and his urine protein of 100.   Will continue taking his blood thinner for now due to his history of PE.SABRA   He completed palliative radiation to the right gluteal subcutaneous nodule and left paraspinal mass under the care of Dr. Shannon. His last day of radiation was on ~01/19/24.    He is scheduled to see nutrition and palliative care today.   I refilled his decadron  and reviewed how to take this.   He may consider trying his anti-emetic if it helps with his abdominal discomfort. Effective use  of omeprazole , calcium  carbonate (he also takes this for hypocalcemia), and antiemetic discussed.   Peripheral edema Peripheral edema managed with as-needed diuretic therapy. Discussed risks of daily diuretic use and need for blood pressure monitoring. - Instructed to use diuretic as needed, not daily. - Advised to check blood pressure before diuretic use and take only if systolic BP is above 120-130 mmHg. - Recommended increasing potassium-rich foods on diuretic days.  The patient was advised to call immediately if he has any concerning symptoms in the interval. The patient voices understanding of current disease status and treatment options and is in agreement with the current care plan. All questions were answered. The patient knows to call the clinic with any problems, questions or concerns. We can certainly see the patient much sooner if necessary    No orders of the defined types were placed in this encounter.    The total time spent in the appointment was 20-29 minutes  Colbi Schiltz L Caileen Veracruz, PA-C 02/29/24

## 2024-02-29 ENCOUNTER — Inpatient Hospital Stay

## 2024-02-29 ENCOUNTER — Inpatient Hospital Stay: Admitting: Dietician

## 2024-02-29 ENCOUNTER — Other Ambulatory Visit: Payer: Self-pay | Admitting: Physician Assistant

## 2024-02-29 ENCOUNTER — Inpatient Hospital Stay (HOSPITAL_BASED_OUTPATIENT_CLINIC_OR_DEPARTMENT_OTHER): Admitting: Nurse Practitioner

## 2024-02-29 ENCOUNTER — Inpatient Hospital Stay: Admitting: Physician Assistant

## 2024-02-29 VITALS — HR 91

## 2024-02-29 VITALS — BP 146/72 | HR 102 | Temp 98.5°F | Resp 17 | Ht 69.0 in | Wt 132.0 lb

## 2024-02-29 DIAGNOSIS — R530 Neoplastic (malignant) related fatigue: Secondary | ICD-10-CM

## 2024-02-29 DIAGNOSIS — C3492 Malignant neoplasm of unspecified part of left bronchus or lung: Secondary | ICD-10-CM

## 2024-02-29 DIAGNOSIS — C3490 Malignant neoplasm of unspecified part of unspecified bronchus or lung: Secondary | ICD-10-CM | POA: Diagnosis not present

## 2024-02-29 DIAGNOSIS — R630 Anorexia: Secondary | ICD-10-CM | POA: Diagnosis not present

## 2024-02-29 DIAGNOSIS — Z5111 Encounter for antineoplastic chemotherapy: Secondary | ICD-10-CM

## 2024-02-29 DIAGNOSIS — Z515 Encounter for palliative care: Secondary | ICD-10-CM | POA: Diagnosis not present

## 2024-02-29 DIAGNOSIS — G893 Neoplasm related pain (acute) (chronic): Secondary | ICD-10-CM

## 2024-02-29 DIAGNOSIS — Z5112 Encounter for antineoplastic immunotherapy: Secondary | ICD-10-CM | POA: Diagnosis not present

## 2024-02-29 LAB — CMP (CANCER CENTER ONLY)
ALT: 15 U/L (ref 0–44)
AST: 31 U/L (ref 15–41)
Albumin: 3.7 g/dL (ref 3.5–5.0)
Alkaline Phosphatase: 97 U/L (ref 38–126)
Anion gap: 14 (ref 5–15)
BUN: 7 mg/dL — ABNORMAL LOW (ref 8–23)
CO2: 23 mmol/L (ref 22–32)
Calcium: 8.3 mg/dL — ABNORMAL LOW (ref 8.9–10.3)
Chloride: 105 mmol/L (ref 98–111)
Creatinine: 0.84 mg/dL (ref 0.61–1.24)
GFR, Estimated: >60 mL/min
Glucose, Bld: 111 mg/dL — ABNORMAL HIGH (ref 70–99)
Potassium: 3.3 mmol/L — ABNORMAL LOW (ref 3.5–5.1)
Sodium: 142 mmol/L (ref 135–145)
Total Bilirubin: 0.3 mg/dL (ref 0.0–1.2)
Total Protein: 6.9 g/dL (ref 6.5–8.1)

## 2024-02-29 LAB — CBC WITH DIFFERENTIAL (CANCER CENTER ONLY)
Abs Immature Granulocytes: 0.14 K/uL — ABNORMAL HIGH (ref 0.00–0.07)
Basophils Absolute: 0 K/uL (ref 0.0–0.1)
Basophils Relative: 0 %
Eosinophils Absolute: 0 K/uL (ref 0.0–0.5)
Eosinophils Relative: 0 %
HCT: 35.2 % — ABNORMAL LOW (ref 39.0–52.0)
Hemoglobin: 11.1 g/dL — ABNORMAL LOW (ref 13.0–17.0)
Immature Granulocytes: 1 %
Lymphocytes Relative: 5 %
Lymphs Abs: 0.7 K/uL (ref 0.7–4.0)
MCH: 29.9 pg (ref 26.0–34.0)
MCHC: 31.5 g/dL (ref 30.0–36.0)
MCV: 94.9 fL (ref 80.0–100.0)
Monocytes Absolute: 0.8 K/uL (ref 0.1–1.0)
Monocytes Relative: 6 %
Neutro Abs: 12.9 K/uL — ABNORMAL HIGH (ref 1.7–7.7)
Neutrophils Relative %: 88 %
Platelet Count: 265 K/uL (ref 150–400)
RBC: 3.71 MIL/uL — ABNORMAL LOW (ref 4.22–5.81)
RDW: 20.5 % — ABNORMAL HIGH (ref 11.5–15.5)
WBC Count: 14.5 K/uL — ABNORMAL HIGH (ref 4.0–10.5)
nRBC: 0.7 % — ABNORMAL HIGH (ref 0.0–0.2)

## 2024-02-29 LAB — SAMPLE TO BLOOD BANK

## 2024-02-29 LAB — TOTAL PROTEIN, URINE DIPSTICK: Protein, ur: 100 mg/dL — AB

## 2024-02-29 MED ORDER — SODIUM CHLORIDE 0.9 % IV SOLN: INTRAVENOUS | Status: DC

## 2024-02-29 MED ORDER — DIPHENHYDRAMINE HCL 50 MG/ML IJ SOLN
50.0000 mg | Freq: Once | INTRAMUSCULAR | Status: AC
  Administered 2024-02-29: 50 mg via INTRAVENOUS
  Filled 2024-02-29: qty 1

## 2024-02-29 MED ORDER — SODIUM CHLORIDE 0.9 % IV SOLN
75.0000 mg/m2 | Freq: Once | INTRAVENOUS | Status: AC
  Administered 2024-02-29: 120 mg via INTRAVENOUS
  Filled 2024-02-29: qty 12

## 2024-02-29 MED ORDER — ACETAMINOPHEN 325 MG PO TABS
650.0000 mg | ORAL_TABLET | Freq: Once | ORAL | Status: AC
  Administered 2024-02-29: 650 mg via ORAL
  Filled 2024-02-29: qty 2

## 2024-02-29 MED ORDER — SODIUM CHLORIDE 0.9 % IV SOLN
10.0000 mg/kg | Freq: Once | INTRAVENOUS | Status: AC
  Administered 2024-02-29: 600 mg via INTRAVENOUS
  Filled 2024-02-29: qty 10

## 2024-02-29 MED ORDER — DEXAMETHASONE 4 MG PO TABS: ORAL_TABLET | ORAL | 2 refills | Status: AC

## 2024-02-29 MED ORDER — DEXAMETHASONE SOD PHOSPHATE PF 10 MG/ML IJ SOLN
10.0000 mg | Freq: Once | INTRAMUSCULAR | Status: AC
  Administered 2024-02-29: 10 mg via INTRAVENOUS
  Filled 2024-02-29: qty 1

## 2024-02-29 NOTE — Progress Notes (Signed)
 Ok to proceed with cyramza  with urine protein = 100 per [place], PA

## 2024-02-29 NOTE — Progress Notes (Signed)
 Nutrition Follow-up:  Pt with stage IV SCC of left lung to bone. He is currently receiving docetaxel  + ramucirumab  q21d. Patient is under the care of Dr. Sherrod.   Met with patient in infusion. He is doing well today. Reports occasional nausea the last 3 weeks. Patient is sensitive to smells and does not want to eat during this time. Reports going 1-2 days without po. Once resolved, patient says he has a great appetite and eats very well. Patient continues drinking premier protein.   Medications: reviewed   Labs: glucose 111, K 3.3, BUN 7  Anthropometrics: Wt 132 lb today  12/30 - 133 lb 8 oz 12/9 - 131 lb 0.1 oz 11/18 - 128 lb 9.6 oz     NUTRITION DIAGNOSIS: Unintended wt loss improving    INTERVENTION: Pt will try taking antiemetics as recommended today per PA-C - suggested waiting 30 minutes before po Discussed strategies for nausea, foods best tolerated and foods to limit/avoid Pt agreeable to increase intake of premier on days with no appetite     MONITORING, EVALUATION, GOAL: wt trends, intake   NEXT VISIT: Tuesday February 10 during infusion

## 2024-02-29 NOTE — Patient Instructions (Signed)
 [facility] - A DEPT OF [facility]  Discharge Instructions: Thank you for choosing [facility] to provide your oncology and hematology care.   If you have a lab appointment with the Cancer Center, please go directly to the Cancer Center and check in at the registration area.   Wear comfortable clothing and clothing appropriate for easy access to any Portacath or PICC line.   We strive to give you quality time with your provider. You may need to reschedule your appointment if you arrive late (15 or more minutes).  Arriving late affects you and other patients whose appointments are after yours.  Also, if you miss three or more appointments without notifying the office, you may be dismissed from the clinic at the providers discretion.      For prescription refill requests, have your pharmacy contact our office and allow 72 hours for refills to be completed.    Today you received the following chemotherapy and/or immunotherapy agents: Cyramza  and Docetaxel       To help prevent nausea and vomiting after your treatment, we encourage you to take your nausea medication as directed.  BELOW ARE SYMPTOMS THAT SHOULD BE REPORTED IMMEDIATELY: *FEVER GREATER THAN 100.4 F (38 C) OR HIGHER *CHILLS OR SWEATING *NAUSEA AND VOMITING THAT IS NOT CONTROLLED WITH YOUR NAUSEA MEDICATION *UNUSUAL SHORTNESS OF BREATH *UNUSUAL BRUISING OR BLEEDING *URINARY PROBLEMS (pain or burning when urinating, or frequent urination) *BOWEL PROBLEMS (unusual diarrhea, constipation, pain near the anus) TENDERNESS IN MOUTH AND THROAT WITH OR WITHOUT PRESENCE OF ULCERS (sore throat, sores in mouth, or a toothache) UNUSUAL RASH, SWELLING OR PAIN  UNUSUAL VAGINAL DISCHARGE OR ITCHING   Items with * indicate a potential emergency and should be followed up as soon as possible or go to the Emergency Department if any problems should occur.  Please show the CHEMOTHERAPY ALERT CARD or  IMMUNOTHERAPY ALERT CARD at check-in to the Emergency Department and triage nurse.  Should you have questions after your visit or need to cancel or reschedule your appointment, please contact [facility] - A DEPT OF [facility]  Dept: [phone]  and follow the prompts.  Office hours are 8:00 a.m. to 4:30 p.m. Monday - Friday. Please note that voicemails left after 4:00 p.m. may not be returned until the following business day.  We are closed weekends and major holidays. You have access to a nurse at all times for urgent questions. Please call the main number to the clinic Dept: [phone] and follow the prompts.   For any non-urgent questions, you may also contact your provider using MyChart. We now offer e-Visits for anyone 38 and older to request care online for non-urgent symptoms. For details visit [email or web address].   Also download the MyChart app! Go to the app store, search MyChart, open the app, select [place], and log in with your MyChart username and password.

## 2024-03-01 ENCOUNTER — Encounter: Payer: Self-pay | Admitting: Nurse Practitioner

## 2024-03-01 ENCOUNTER — Encounter: Payer: Self-pay | Admitting: Internal Medicine

## 2024-03-01 MED ORDER — OXYCODONE HCL 5 MG PO TABS: 5.0000 mg | ORAL_TABLET | ORAL | 0 refills | Status: AC | PRN

## 2024-03-01 NOTE — Progress Notes (Signed)
 "    Palliative Medicine [facility]  [phone] [phone]) [phone]   Name: Albert Hardy. Date: 03/01/2024 MRN: [id number]  DOB: 1955-09-29  Patient Care Team: [name], MD as PCP - General (Family Medicine)    INTERVAL HISTORY: Albert Kirsch. is a 69 y.o. 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 Palliative ask to see for symptom management and goals of care   SOCIAL HISTORY:     reports that he quit smoking about 12 years ago. His smoking use included cigarettes. He started smoking about 42 years ago. He has a 30 pack-year smoking history. He has never used smokeless tobacco. He reports current alcohol use. He reports current drug use. Frequency: 1.00 time per week. Drug: Marijuana.  ADVANCE DIRECTIVES:  None on file  CODE STATUS: Full code  PAST MEDICAL HISTORY: Past Medical History:  Diagnosis Date   Aortic regurgitation    Aortic stenosis 04/04/2018   Atherosclerosis of native arteries of the extremities with intermittent claudication 09/08/2013   Atherosclerosis of native artery of extremity with intermittent claudication 09/08/2013   IMO SNOMED Dx Update Oct 2024     Bilateral impacted cerumen 10/13/2018   Bilateral sensorineural hearing loss 12/14/2018   Cancer (HCC)    lung   Dyspnea    Encounter for antineoplastic chemotherapy 08/18/2022   Encounter for antineoplastic immunotherapy 08/18/2022   Former moderate cigarette smoker (10-19 per day) 05/20/2012   Quit smoking Nov 2013 when hospitalized w/ HTN and chest pain(diagnosed w/ valvular heart disease)   GERD (gastroesophageal reflux disease)    Heart murmur    History of radiation therapy    Lumbar Spine, Thoracic Spine- 06/25/22-07/10/22- Dr. Lynwood Nasuti   History of radiation therapy    Left lung-07/29/23-08/11/23- Dr. 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  MEDICATIONS:  Current Outpatient Medications  Medication Sig Dispense Refill   albuterol  (VENTOLIN  HFA) 108 (90 Base) MCG/ACT inhaler Inhale 2 puffs into the lungs every 6 (six) hours as needed for wheezing or shortness of breath. 8 g 6   ANORO ELLIPTA  62.5-25 MCG/ACT AEPB Inhale 1 puff by mouth once daily 60 each 0   apixaban  (ELIQUIS ) 5 MG TABS tablet Take 1 tablet (5 mg total) by mouth 2 (two) times daily. 60 tablet 0   Ascorbic Acid (VITAMIN C PO) Take 1 tablet by mouth daily.     azithromycin  (ZITHROMAX  Z-PAK) 250 MG tablet Take as directed 6 each 0   benzonatate  (TESSALON ) 100 MG capsule Take 1 capsule (100 mg total) by mouth 3 (three) times daily as needed. (Patient not taking: Reported on 01/18/2024) 30 capsule 2   calcium  carbonate (TUMS - DOSED IN MG ELEMENTAL CALCIUM ) 500 MG chewable tablet Chew 2 tablets by mouth daily.     Cholecalciferol (VITAMIN D3) 1000 UNITS CAPS Take 1,000 Units by mouth daily.     Coenzyme Q10 (CO Q 10 PO) Take 1 tablet by mouth daily.     Cyanocobalamin (VITAMIN B-12 PO) Take 1 tablet by mouth daily.     cyclobenzaprine (  FLEXERIL) 5 MG tablet Take 1 tablet (5 mg total) by mouth 3 (three) times daily as needed for muscle spasms. (Patient not taking: Reported on 01/18/2024) 30 tablet 0   dexamethasone  (DECADRON ) 4 MG tablet Take 2 tabs by mouth 2 times daily starting day before chemo. Then take 2 tabs daily for 2 days starting day after chemo. Take with food. 30 tablet 1   dexamethasone  (DECADRON ) 4 MG tablet Take 2 tablets TWICE a day the day before, the day of, and the day after chemotherapy 40 tablet 2   ezetimibe  (ZETIA ) 10 MG tablet Take 1 tablet (10  mg total) by mouth daily. 90 tablet 1   fluticasone  (FLONASE ) 50 MCG/ACT nasal spray Place 1-2 sprays into both nostrils daily. 16 g 6   furosemide  (LASIX ) 20 MG tablet 1 tablet p.o. daily for 1 week and then on as needed for swelling or shortness of breath after that. 30 tablet 0   gabapentin  (NEURONTIN ) 300 MG capsule Take 1 capsule by mouth twice daily (Patient not taking: Reported on 01/18/2024) 60 capsule 0   KRILL OIL PO Take 1 tablet by mouth daily.     lisinopril  (ZESTRIL ) 10 MG tablet Take 1 tablet (10 mg total) by mouth daily. 90 tablet 3   MAGNESIUM PO Take 1 tablet by mouth daily.     methylPREDNISolone  (MEDROL  DOSEPAK) 4 MG TBPK tablet Take with signs of chronic sinusitis and take as directed (Patient not taking: Reported on 01/18/2024) 1 each 1   metoCLOPramide  (REGLAN ) 10 MG tablet Take 1 tablet (10 mg total) by mouth 4 (four) times daily -  before meals and at bedtime. (Patient not taking: Reported on 01/18/2024) 56 tablet 0   metoprolol  tartrate (LOPRESSOR ) 25 MG tablet Take 1 tablet (25 mg total) by mouth once for 1 dose. Please take this medication 2 hours before CT 1 tablet 0   morphine  (MS CONTIN ) 15 MG 12 hr tablet Take 1 tablet (15 mg total) by mouth every 8 (eight) hours. 90 tablet 0   Multiple Vitamins-Minerals (CENTRUM SILVER PO) Take 1 tablet by mouth daily.     Multiple Vitamins-Minerals (ZINC PO) Take 1 tablet by mouth daily.     Naphazoline-Pheniramine (OPCON-A) 0.027-0.315 % SOLN Place 1 drop into both eyes daily as needed (redness).     nitroGLYCERIN  (NITROSTAT ) 0.4 MG SL tablet Place 1 tablet (0.4 mg total) under the tongue every 5 (five) minutes as needed for chest pain. 25 tablet 1   omeprazole  (PRILOSEC) 20 MG capsule Take 1 capsule (20 mg total) by mouth daily. 90 capsule 1   ondansetron  (ZOFRAN ) 8 MG tablet Take 1 tablet (8 mg total) by mouth every 8 (eight) hours as needed for nausea or vomiting. 30 tablet 1   oxyCODONE  (OXY IR/ROXICODONE ) 5 MG immediate release  tablet Take 1 tablet (5 mg total) by mouth every 4 (four) hours as needed for severe pain (pain score 7-10) or breakthrough pain. 90 tablet 0   potassium chloride  SA (KLOR-CON  M) 20 MEQ tablet Take 1 tablet (20 mEq total) by mouth daily. 6 tablet 0   prochlorperazine  (COMPAZINE ) 10 MG tablet Take 1 tablet (10 mg total) by mouth every 6 (six) hours as needed for nausea or vomiting. 30 tablet 0   prochlorperazine  (COMPAZINE ) 10 MG tablet Take 1 tablet (10 mg total) by mouth every 6 (six) hours as needed. (Patient not taking: Reported on 01/18/2024) 30 tablet 2   prochlorperazine  (COMPAZINE ) 10 MG tablet Take 1 tablet (10  mg total) by mouth every 6 (six) hours as needed for nausea or vomiting. 30 tablet 1   rosuvastatin  (CRESTOR ) 5 MG tablet Take 1 tablet (5 mg total) by mouth at bedtime. 90 tablet 1   sucralfate  (CARAFATE ) 1 g tablet Take 1 tablet (1 g total) by mouth 4 (four) times daily -  with meals and at bedtime. Crush and dissolve in 10 mL's of warm water prior to swallowing, take 20 min prior to meals 60 tablet 1   triamcinolone  0.1%-Eucerin equivalent 1:1 cream mixture Apply topically 3 (three) times daily as needed. 480 g 2   TURMERIC PO Take 1 tablet by mouth 3 (three) times a week.     No current facility-administered medications for this visit.    VITAL SIGNS: There were no vitals taken for this visit. There were no vitals filed for this visit.  Estimated body mass index is 19.49 kg/m as calculated from the following:   Height as of an earlier encounter on 02/29/24: 5' 9 (1.753 m).   Weight as of an earlier encounter on 02/29/24: 132 lb (59.9 kg).     Latest Ref Rng & Units 02/29/2024   10:48 AM 02/08/2024   10:24 AM 01/18/2024    8:51 AM  CBC  WBC 4.0 - 10.5 K/uL 14.5  13.0  16.6   Hemoglobin 13.0 - 17.0 g/dL 88.8  9.1  9.8   Hematocrit 39.0 - 52.0 % 35.2  28.5  30.4   Platelets 150 - 400 K/uL 265  170  259        Latest Ref Rng & Units 02/29/2024   10:48 AM 02/08/2024    10:24 AM 01/18/2024    8:51 AM  CMP  Glucose 70 - 99 mg/dL 888  883  866   BUN 8 - 23 mg/dL 7  13  14    Creatinine 0.61 - 1.24 mg/dL 9.15  9.12  9.30   Sodium 135 - 145 mmol/L 142  143  142   Potassium 3.5 - 5.1 mmol/L 3.3  3.7  4.1   Chloride 98 - 111 mmol/L 105  108  108   CO2 22 - 32 mmol/L 23  26  23    Calcium  8.9 - 10.3 mg/dL 8.3  8.4  8.4   Total Protein 6.5 - 8.1 g/dL 6.9  6.2  6.5   Total Bilirubin 0.0 - 1.2 mg/dL 0.3  <9.7  <9.7   Alkaline Phos 38 - 126 U/L 97  72  70   AST 15 - 41 U/L 31  22  22    ALT 0 - 44 U/L 15  15  12      PERFORMANCE STATUS (ECOG) : 1 - Symptomatic but completely ambulatory   Physical Exam General: NAD Cardiovascular: RRR Pulmonary: normal breathing pattern  Extremities: no edema, no joint deformities Skin: no rashes Neurological: AAO x4  IMPRESSION: Discussed the use of AI scribe software for clinical note transcription with the patient, who gave verbal consent to proceed.  History of Present Illness Albert Hardy is a 69 year old male with stage IV non-small cell lung cancer who was seen during his infusion. No acute distress.   His appetite is improving but remains inconsistent, with some days of good intake followed by days of gastrointestinal discomfort, necessitating the use of nausea medication. His current weight is stable at 132lbs.   He describes his energy level as low, attributing it to personal choice rather than a medical issue. He notes that he  becomes comfortable and less active when warm as he does not like being cold.   Albert Hardy reports his pain is controlled on current regimen. No adjustments needed at this time. He is taking morphine  15 mg every eight hours and oxycodone  5 mg every four hours as needed for pain management.  We will continue to closely follow and support. All questions answered and support provided.  Goals of Care  06/18/22-  We discussed his current illness and what it means in the larger context of hsi on-going  co-morbidities. Natural disease trajectory and expectations were discussed.  Albert Hardy are realistic in their expectations and understanding of his incurable cancer. He knows all treatment is palliative focused. Wishes to continue taking things one day at a time focusing on his quality of life allowing him every opportunity to continue to thrive.   We discussed Her current illness and what it means in the larger context of Her on-going co-morbidities. Natural disease trajectory and expectations were discussed.  I discussed the importance of continued conversation with family and their medical providers regarding overall plan of care and treatment options, ensuring decisions are within the context of the patients values and GOCs. Assessment & Plan Palliative care management for advanced lung cancer Intermittent nausea and variable appetite. Energy levels are low, possibly due to comfort and environmental factors.  Neoplasm related pain Pain is effectively managed with morphine  15 mg every 8 hours and oxycodone  5 mg every 4 hours as needed. - Continue morphine  15 mg every 8 hours. - Continue oxycodone  5 mg every 4 hours as needed.   I will plan to follow-up with patient in 3-4 weeks. Sooner if needed.   Patient expressed understanding and was in agreement with this plan. He also understands that He can call the clinic at any time with any questions, concerns, or complaints.   Any controlled substances utilized were prescribed in the context of palliative care. PDMP has been reviewed.   I personally spent a total of 30 minutes in the care of the patient today including preparing to see the patient, getting/reviewing separately obtained history, performing a medically appropriate exam/evaluation, counseling and educating, placing orders, and independently interpreting results. Visit consisted of counseling and education dealing with the complex and emotionally intense issues of symptom  management and palliative care in the setting of serious and potentially life-threatening illness.  Levon Borer, AGPCNP-BC  Palliative Medicine Team/ Cancer Center   "

## 2024-03-02 ENCOUNTER — Inpatient Hospital Stay

## 2024-03-02 VITALS — BP 140/66 | HR 92 | Resp 17

## 2024-03-02 DIAGNOSIS — Z5112 Encounter for antineoplastic immunotherapy: Secondary | ICD-10-CM | POA: Diagnosis not present

## 2024-03-02 DIAGNOSIS — C3492 Malignant neoplasm of unspecified part of left bronchus or lung: Secondary | ICD-10-CM

## 2024-03-02 MED ORDER — PEGFILGRASTIM-FPGK 6 MG/0.6ML ~~LOC~~ SOSY
6.0000 mg | PREFILLED_SYRINGE | Freq: Once | SUBCUTANEOUS | Status: AC
  Administered 2024-03-02: 6 mg via SUBCUTANEOUS
  Filled 2024-03-02: qty 0.6

## 2024-03-03 ENCOUNTER — Other Ambulatory Visit: Payer: Self-pay | Admitting: Family Medicine

## 2024-03-05 ENCOUNTER — Other Ambulatory Visit: Payer: Self-pay | Admitting: Internal Medicine

## 2024-03-06 ENCOUNTER — Encounter: Payer: Self-pay | Admitting: Internal Medicine

## 2024-03-08 ENCOUNTER — Telehealth (INDEPENDENT_AMBULATORY_CARE_PROVIDER_SITE_OTHER): Payer: Self-pay | Admitting: Otolaryngology

## 2024-03-08 ENCOUNTER — Encounter (INDEPENDENT_AMBULATORY_CARE_PROVIDER_SITE_OTHER): Payer: Self-pay

## 2024-03-08 NOTE — Telephone Encounter (Signed)
 Left voice message and sent MyChart for patient to call back to reschedule appointment.  Dr Soldatova no longer in our office.

## 2024-03-16 ENCOUNTER — Encounter: Payer: Self-pay | Admitting: Family Medicine

## 2024-03-16 ENCOUNTER — Telehealth: Payer: Self-pay

## 2024-03-16 ENCOUNTER — Ambulatory Visit: Admitting: Family Medicine

## 2024-03-16 VITALS — BP 138/60 | HR 82 | Temp 98.5°F | Resp 16 | Ht 69.0 in | Wt 135.4 lb

## 2024-03-16 DIAGNOSIS — I10 Essential (primary) hypertension: Secondary | ICD-10-CM

## 2024-03-16 DIAGNOSIS — C778 Secondary and unspecified malignant neoplasm of lymph nodes of multiple regions: Secondary | ICD-10-CM

## 2024-03-16 DIAGNOSIS — E785 Hyperlipidemia, unspecified: Secondary | ICD-10-CM

## 2024-03-16 DIAGNOSIS — K219 Gastro-esophageal reflux disease without esophagitis: Secondary | ICD-10-CM

## 2024-03-16 DIAGNOSIS — C3492 Malignant neoplasm of unspecified part of left bronchus or lung: Secondary | ICD-10-CM

## 2024-03-16 DIAGNOSIS — I3139 Other pericardial effusion (noninflammatory): Secondary | ICD-10-CM

## 2024-03-16 DIAGNOSIS — J431 Panlobular emphysema: Secondary | ICD-10-CM

## 2024-03-16 LAB — LIPID PANEL
Cholesterol: 146 mg/dL (ref 28–200)
HDL: 52.4 mg/dL
LDL Cholesterol: 79 mg/dL (ref 10–99)
NonHDL: 93.66
Total CHOL/HDL Ratio: 3
Triglycerides: 72 mg/dL (ref 10.0–149.0)
VLDL: 14.4 mg/dL (ref 0.0–40.0)

## 2024-03-16 LAB — COMPREHENSIVE METABOLIC PANEL WITH GFR
ALT: 12 U/L (ref 3–53)
AST: 19 U/L (ref 5–37)
Albumin: 3.2 g/dL — ABNORMAL LOW (ref 3.5–5.2)
Alkaline Phosphatase: 75 U/L (ref 39–117)
BUN: 12 mg/dL (ref 6–23)
CO2: 32 meq/L (ref 19–32)
Calcium: 7.7 mg/dL — ABNORMAL LOW (ref 8.4–10.5)
Chloride: 108 meq/L (ref 96–112)
Creatinine, Ser: 0.74 mg/dL (ref 0.40–1.50)
GFR: 92.96 mL/min
Glucose, Bld: 97 mg/dL (ref 70–99)
Potassium: 4.1 meq/L (ref 3.5–5.1)
Sodium: 144 meq/L (ref 135–145)
Total Bilirubin: 0.2 mg/dL (ref 0.2–1.2)
Total Protein: 5.7 g/dL — ABNORMAL LOW (ref 6.0–8.3)

## 2024-03-16 NOTE — Progress Notes (Unsigned)
 Care Guide Pharmacy Note  03/16/2024 Name: Albert Hardy. MRN: [id number] DOB: 1955/05/27  Referred By: [name], MD Reason for referral: Call Attempt #1 and Complex Care Management (Outreach to sch ref w/ Pharm)   [name] is a 69 y.o. year old male who is a primary care patient of [name], MD.  [name] was referred to the pharmacist for assistance related to: HTN, HLD, and lung cancer.   An unsuccessful telephone outreach was attempted today to contact the patient who was referred to the pharmacy team for assistance with medication assistance. Additional attempts will be made to contact the patient.  Doyce Razor [facility], [facility] Guide Direct Dial: [phone]  Fax: [phone]

## 2024-03-16 NOTE — Patient Instructions (Signed)
 Thank you for coming in today. No change in medications at this time.  Upper blood pressure number was borderline elevated but your lower blood pressure number is on the lower side.  With the intermittent use of the furosemide  for swelling that can also lower blood pressure.  I think we should keep lisinopril  same dose for now.  I will reach out to your cardiologist about the pericardial effusion or fluid surrounding the heart that was seen on the most recent CT scan.  Tried to restart the Anoro daily to see if that helps with some of the wheezing, emphysema symptoms.  I have placed a referral to a pharmacist to evaluate for cost savings with that medications or alternatives if needed.  They should be contacting you soon.  If there are any concerns on your bloodwork, I will let you know. Take care and please reach out to me with any questions.  I will see you in 3 months.

## 2024-03-16 NOTE — Progress Notes (Signed)
 "  Subjective:  Patient ID: Albert Crapps., male    DOB: 1955/11/04  Age: 69 y.o. MRN: [id number]  CC:  Chief Complaint  Patient presents with   Follow-up    3 month follow up on HTN. Patient is doing well. Oncology started a new medication and he cannot recall the name. Just wanted you to know    HPI Albert Hardy. presents for   Hypertension: With prior history of hypotension, medication adjustments and then additional medication added back in for elevated readings.  Has been under the care of oncology including palliative radiotherapy, chemotherapy for non-small cell metastatic lung cancer. Based on well-controlled blood pressure at his October visit I discontinued his hydrochlorothiazide , remained on just lisinopril  10 mg daily.  Few recent elevated readings noted.  Borderline today systolic but diastolic 60.  History of peripheral edema managed with as needed diuretic therapy, and per last oncology note on January 20, only to take diuretic if systolic blood pressure above [phone].  Increase potassium rich foods on diuretic days. Only using diuretic 1-2 times per week.  Home readings: no recent readings.  BP Readings from Last 3 Encounters:  03/16/24 138/60  03/02/24 (!) 140/66  02/29/24 (!) 146/72   Lab Results  Component Value Date   CREATININE 0.74 03/16/2024   Prediabetes: Overall stable with A1c of 6.1 in October.  Weight up a few pounds.  Treatment with chemo, radiation for lung cancer as above. Appetite Lab Results  Component Value Date   HGBA1C 6.1 (A) 12/06/2023   HGBA1C 6.1 12/06/2023   HGBA1C 6.1 12/06/2023   HGBA1C 6.1 12/06/2023   Wt Readings from Last 3 Encounters:  03/16/24 135 lb 6.4 oz (61.4 kg)  02/29/24 132 lb (59.9 kg)  02/08/24 133 lb 8 oz (60.6 kg)   Hyperlipidemia: Treated with Zetia , Crestor .  Tolerating current regimen, plan for labs today. Lab Results  Component Value Date   CHOL 146 03/16/2024   HDL 52.40 03/16/2024   LDLCALC 79  03/16/2024   TRIG 72.0 03/16/2024   CHOLHDL 3 03/16/2024   Lab Results  Component Value Date   ALT 12 03/16/2024   AST 19 03/16/2024   ALKPHOS 75 03/16/2024   BILITOT 0.2 03/16/2024    Lung cancer, non-small cell, metastatic Followed by palliative care in addition to oncology for metastatic lung cancer as above.  Office visit noted January 20 with palliative care.  Pain was effectively managed with morphine , oxycodone .  Denies constipation with use of stool softener. 3 to 4-week follow-up planned. Nutrition appointment noted January 20, antiemetics as needed planned, with waiting 30 minutes before eating.  Strategies for nausea medication discussed as well as dietary recommendations.  Premier protein supplement on days with no appetite.  Continued on omeprazole , calcium  carbonate.  Only intermittent need for zofran  - usually week after chemo.  Completed palliative radiation to the right gluteal subcutaneous nodule and left paraspinal mass.  Oncology note indicated scan from previous month  -02/01/2024 - showed positive response to treatment with chemotherapy. New moderate pericardial effusion was noted on CT on 12/23.  Small right and moderate left pleural effusions.  Cardiology follow-up recommended. Dr. Monetta. No appt noted soon.  No new dyspnea, small episode last night of shortness of breath with cough, better now, no chest pain.  Has had to spread out use of Anoro Ellipta  d/t cost - over $500 per rx. Using every other day.     History Patient Active Problem List   Diagnosis Date  Noted   Goals of care, counseling/discussion 12/01/2023   Cancer (HCC)    Dyspnea    History of radiation therapy    Encounter for antineoplastic chemotherapy 08/18/2022   Encounter for antineoplastic immunotherapy 08/18/2022   Squamous cell carcinoma of lung, stage IV, left (HCC) 05/29/2022   Lung nodule 05/15/2022   Heart murmur 08/28/2021   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   Bilateral  sensorineural hearing loss 12/14/2018   Bilateral impacted cerumen 10/13/2018   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   Atherosclerosis of native artery of extremity with intermittent claudication 09/08/2013   Former moderate cigarette smoker (10-19 per day) 05/20/2012   Peripheral vascular disease with claudication    Hyperlipidemia    Aortic insufficiency    GERD (gastroesophageal reflux disease)    Hypertension    Past Medical History:  Diagnosis Date   Allergy    Aortic regurgitation    Aortic stenosis 04/04/2018   Atherosclerosis of native arteries of the extremities with intermittent claudication 09/08/2013   Atherosclerosis of native artery of extremity with intermittent claudication 09/08/2013   IMO SNOMED Dx Update Oct 2024     Bilateral impacted cerumen 10/13/2018   Bilateral sensorineural hearing loss 12/14/2018   Cancer (HCC)    lung   Dyspnea    Emphysema of lung (HCC)    Encounter for antineoplastic chemotherapy 08/18/2022   Encounter for antineoplastic immunotherapy 08/18/2022   Former moderate cigarette smoker (10-19 per day) 05/20/2012   Quit smoking Nov 2013 when hospitalized w/ HTN and chest pain(diagnosed w/ valvular heart disease)   GERD (gastroesophageal reflux disease)    Heart murmur    History of radiation therapy    Lumbar Spine, Thoracic Spine- 06/25/22-07/10/22- Dr. Lynwood Nasuti   History of radiation therapy    Left lung-07/29/23-08/11/23- Dr. 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   Substance abuse (HCC)    Past Surgical History:  Procedure Laterality Date   BRONCHIAL BIOPSY  05/26/2022   Procedure: BRONCHIAL BIOPSIES;  Surgeon: Brenna Adine CROME, DO;  Location: [facility];  Service:  Pulmonary;;   BRONCHIAL BRUSHINGS  05/26/2022   Procedure: BRONCHIAL BRUSHINGS;  Surgeon: Brenna Adine CROME, DO;  Location: [facility];  Service: Pulmonary;;   NO PAST SURGERIES     VIDEO BRONCHOSCOPY  05/26/2022   Procedure: VIDEO BRONCHOSCOPY WITHOUT FLUORO;  Surgeon: Brenna Adine CROME, DO;  Location: [facility];  Service: Pulmonary;;   Allergies[1] Prior to Admission medications  Medication Sig Start Date End Date Taking? Authorizing Provider  albuterol  (VENTOLIN  HFA) 108 (90 Base) MCG/ACT inhaler Inhale 2 puffs into the lungs every 6 (six) hours as needed for wheezing or shortness of breath. 05/19/23  Yes [name], MD  ANORO ELLIPTA  62.5-25 MCG/ACT AEPB Inhale 1 puff by mouth once daily 03/03/24  Yes Tabori, Katherine E, MD  apixaban  (ELIQUIS ) 5 MG TABS tablet Take 1 tablet (5 mg total) by mouth 2 (two) times daily. 02/02/24  Yes Pickenpack-Cousar, Fannie SAILOR, NP  Ascorbic Acid (VITAMIN C PO) Take 1 tablet by mouth daily.   Yes [provider]  calcium  carbonate (TUMS - DOSED IN MG ELEMENTAL CALCIUM ) 500 MG chewable tablet Chew 2 tablets by mouth daily.   Yes [provider]  Cholecalciferol (VITAMIN D3) 1000 UNITS CAPS Take 1,000 Units by mouth daily.   Yes [provider]  Coenzyme Q10 (CO Q 10 PO) Take 1 tablet by mouth daily.   Yes [provider]  Cyanocobalamin (VITAMIN B-12 PO) Take 1 tablet by mouth daily.   Yes [provider]  cyclobenzaprine (FLEXERIL) 5 MG tablet Take 1 tablet (5 mg total) by mouth 3 (three) times daily as needed for muscle spasms. 06/18/22  Yes Pickenpack-Cousar, Fannie SAILOR, NP  dexamethasone  (DECADRON ) 4 MG tablet Take 2 tabs by mouth 2 times daily starting day before chemo. Then take 2 tabs daily for 2 days starting day after chemo. Take with food. 12/01/23  Yes Sherrod Sherrod, MD  dexamethasone  (DECADRON ) 4 MG tablet Take 2 tablets TWICE a day the day before, the day of, and the day after chemotherapy 02/29/24  Yes  Heilingoetter, Cassandra L, PA-C  ezetimibe  (ZETIA ) 10 MG tablet Take 1 tablet (10 mg total) by mouth daily. 12/06/23  Yes [name], MD  fluticasone  (FLONASE ) 50 MCG/ACT nasal spray Place 1-2 sprays into both nostrils daily. 04/24/20  Yes [name], MD  furosemide  (LASIX ) 20 MG tablet TAKE 1 TABLET BY MOUTH ONCE DAILY FOR  1  WEEK  AND  THEN  AS  NEEDED  FOR  SWELLING  OR  SHORTNESS  OF  BREATH  AFTER  THAT 03/06/24  Yes Sherrod Sherrod, MD  gabapentin  (NEURONTIN ) 300 MG capsule Take 1 capsule by mouth twice daily 11/14/23  Yes Pickenpack-Cousar, Athena N, NP  KRILL OIL PO Take 1 tablet by mouth daily.   Yes [provider]  lisinopril  (ZESTRIL ) 10 MG tablet Take 1 tablet (10 mg total) by mouth daily. 12/06/23  Yes [name], MD  MAGNESIUM PO Take 1 tablet by mouth daily.   Yes [provider]  metoCLOPramide  (REGLAN ) 10 MG tablet Take 1 tablet (10 mg total) by mouth 4 (four) times daily -  before meals and at bedtime. 07/20/22  Yes Pickenpack-Cousar, Fannie SAILOR, NP  morphine  (MS CONTIN ) 15 MG 12 hr tablet Take 1 tablet (15 mg total) by mouth every 8 (eight) hours. 02/08/24  Yes Pickenpack-Cousar, Fannie SAILOR, NP  Multiple Vitamins-Minerals (CENTRUM SILVER PO) Take 1 tablet by mouth daily.   Yes [provider]  Multiple Vitamins-Minerals (ZINC PO) Take 1 tablet by mouth daily.   Yes [provider]  Naphazoline-Pheniramine (OPCON-A) 0.027-0.315 % SOLN Place 1 drop into both eyes daily as needed (redness).   Yes [provider]  nitroGLYCERIN  (NITROSTAT ) 0.4 MG SL tablet Place 1 tablet (0.4 mg total) under the tongue every 5 (five) minutes as needed for chest pain. 10/05/22  Yes Monetta Redell PARAS, MD  omeprazole  (PRILOSEC) 20 MG capsule Take 1 capsule (20 mg total) by mouth daily. 12/06/23  Yes [name], MD  ondansetron  (ZOFRAN ) 8 MG tablet Take 1 tablet (8 mg total) by mouth every 8 (eight) hours as needed for nausea or vomiting.  12/01/23  Yes Sherrod Sherrod, MD  oxyCODONE  (OXY IR/ROXICODONE ) 5 MG immediate release tablet Take 1 tablet (5 mg total) by mouth every 4 (four) hours as needed for severe pain (pain score 7-10) or breakthrough pain. 03/01/24  Yes Pickenpack-Cousar, Fannie SAILOR, NP  potassium chloride  SA (KLOR-CON  M) 20 MEQ tablet Take 1 tablet (20 mEq total) by mouth daily. 12/22/22  Yes Heilingoetter, Cassandra L, PA-C  prochlorperazine  (COMPAZINE ) 10 MG tablet Take 1 tablet (10 mg total) by  mouth every 6 (six) hours as needed for nausea or vomiting. 06/09/22  Yes Sherrod Sherrod, MD  prochlorperazine  (COMPAZINE ) 10 MG tablet Take 1 tablet (10 mg total) by mouth every 6 (six) hours as needed for nausea or vomiting. 12/01/23  Yes Sherrod Sherrod, MD  rosuvastatin  (CRESTOR ) 5 MG tablet Take 1 tablet (5 mg total) by mouth at bedtime. 12/06/23  Yes [name], MD  sucralfate  (CARAFATE ) 1 g tablet Take 1 tablet (1 g total) by mouth 4 (four) times daily -  with meals and at bedtime. Crush and dissolve in 10 mL's of warm water prior to swallowing, take 20 min prior to meals 08/03/23  Yes Shannon Agent, MD  triamcinolone  0.1%-Eucerin equivalent 1:1 cream mixture Apply topically 3 (three) times daily as needed. 04/06/23  Yes Pickenpack-Cousar, Fannie SAILOR, NP  TURMERIC PO Take 1 tablet by mouth 3 (three) times a week.   Yes [provider]  azithromycin  (ZITHROMAX  Z-PAK) 250 MG tablet Take as directed Patient not taking: Reported on 03/16/2024 12/01/23   Heilingoetter, Cassandra L, PA-C  benzonatate  (TESSALON ) 100 MG capsule Take 1 capsule (100 mg total) by mouth 3 (three) times daily as needed. Patient not taking: Reported on 03/16/2024 08/17/23   Heilingoetter, Cassandra L, PA-C  methylPREDNISolone  (MEDROL  DOSEPAK) 4 MG TBPK tablet Take with signs of chronic sinusitis and take as directed Patient not taking: Reported on 03/16/2024 10/06/23   Soldatova, [place], MD  metoprolol  tartrate (LOPRESSOR ) 25 MG tablet Take 1 tablet  (25 mg total) by mouth once for 1 dose. Please take this medication 2 hours before CT 01/05/23 12/28/23  Monetta Redell PARAS, MD  prochlorperazine  (COMPAZINE ) 10 MG tablet Take 1 tablet (10 mg total) by mouth every 6 (six) hours as needed. Patient not taking: Reported on 03/16/2024 12/01/23   Heilingoetter, Cassandra L, PA-C   Social History   Socioeconomic History   Marital status: Married    Spouse name: Zebedee   Number of children: Not on file   Years of education: Not on file   Highest education level: Associate degree: academic program  Occupational History   Occupation: Recruitment Consultant  Tobacco Use   Smoking status: Former    Current packs/day: 0.00    Average packs/day: 1 pack/day for 30.0 years (30.0 ttl pk-yrs)    Types: Cigarettes    Start date: 12/20/1981    Quit date: 12/21/2011    Years since quitting: 12.2   Smokeless tobacco: Never  Vaping Use   Vaping status: Every Day  Substance and Sexual Activity   Alcohol use: Yes    Comment: Ocassionaly.    Drug use: Yes    Frequency: 1.0 times per week    Types: Marijuana   Sexual activity: Yes    Birth control/protection: None  Other Topics Concern   Not on file  Social History Narrative   Married. Education: [employer]. Exercise:  Somewhat.   Social Drivers of Health   Tobacco Use: Medium Risk (03/16/2024)   Patient History    Smoking Tobacco Use: Former    Smokeless Tobacco Use: Never    Passive Exposure: Not on file  Financial Resource Strain: Low Risk (12/03/2023)   Overall Financial Resource Strain (CARDIA)    Difficulty of Paying Living Expenses: Not hard at all  Food Insecurity: No Food Insecurity (12/03/2023)   Epic    Worried About Programme Researcher, Broadcasting/film/video in the Last Year: Never true    Ran Out of Food in the Last Year: Never true  Transportation Needs: No Transportation Needs (12/03/2023)   Epic    Lack of Transportation (Medical): No    Lack of Transportation (Non-Medical): No  Physical Activity:  Inactive (12/03/2023)   Exercise Vital Sign    Days of Exercise per Week: 1 day    Minutes of Exercise per Session: 0 min  Stress: No Stress Concern Present (12/03/2023)   [employer] of Occupational Health - Occupational Stress Questionnaire    Feeling of Stress: Not at all  Social Connections: Moderately Isolated (12/03/2023)   Social Connection and Isolation Panel    Frequency of Communication with Friends and Family: Once a week    Frequency of Social Gatherings with Friends and Family: Once a week    Attends Religious Services: More than 4 times per year    Active Member of [employer] or Organizations: No    Attends Banker Meetings: Not on file    Marital Status: Married  Intimate Partner Violence: Not At Risk (07/22/2023)   Epic    Fear of Current or Ex-Partner: No    Emotionally Abused: No    Physically Abused: No    Sexually Abused: No  Depression (PHQ2-9): Low Risk (03/16/2024)   Depression (PHQ2-9)    PHQ-2 Score: 0  Alcohol Screen: Low Risk (12/03/2023)   Alcohol Screen    Last Alcohol Screening Score (AUDIT): 1  Housing: Low Risk (12/03/2023)   Epic    Unable to Pay for Housing in the Last Year: No    Number of Times Moved in the Last Year: 0    Homeless in the Last Year: No  Utilities: Not At Risk (07/22/2023)   Epic    Threatened with loss of utilities: No  Health Literacy: Adequate Health Literacy (11/12/2022)   B1300 Health Literacy    Frequency of need for help with medical instructions: Never    Review of Systems   Objective:   Vitals:   03/16/24 1013  BP: 138/60  Pulse: 82  Resp: 16  Temp: 98.5 F (36.9 C)  TempSrc: Temporal  SpO2: 100%  Weight: 135 lb 6.4 oz (61.4 kg)  Height: 5' 9 (1.753 m)     Physical Exam Vitals reviewed.  Constitutional:      Appearance: He is well-developed.  HENT:     Head: Normocephalic and atraumatic.  Neck:     Vascular: No carotid bruit or JVD.  Cardiovascular:     Rate and Rhythm: Normal  rate and regular rhythm.     Heart sounds: Normal heart sounds. No murmur (No apparent murmur.  Few ectopic beats.) heard. Pulmonary:     Effort: Pulmonary effort is normal. No respiratory distress.     Breath sounds: Normal breath sounds. No wheezing (Few end expiratory wheezes, few scattered coarse breath sounds.  Speaking full sentences without respiratory distress.) or rales.  Musculoskeletal:     Right lower leg: No edema.     Left lower leg: No edema.  Skin:    General: Skin is warm and dry.  Neurological:     Mental Status: He is alert and oriented to person, place, and time.  Psychiatric:        Mood and Affect: Mood normal.     Assessment & Plan:  Albert Covalt. is a 69 y.o. male . Essential hypertension - Plan: Comprehensive metabolic panel with GFR  - Borderline, no change in meds for now, especially with intermittent need for furosemide  with guidance provided above on recommended minimal systolic prior  to taking furosemide .  Check labs and adjust plan accordingly.  Pericardial effusion  - Noted on most recent CT in December.  Asymptomatic.  Will message his cardiologist, forward this note to determine if follow-up imaging, testing recommended.  Hyperlipidemia, unspecified hyperlipidemia type - Plan: Comprehensive metabolic panel with GFR, Lipid panel  - Tolerating current med regimen, check labs and adjust plan accordingly.  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  Pain is controlled with current regimen, appreciate assistance from palliative care as well.  Nutrition recommendations as above, weight is stable.  Gastroesophageal reflux disease, unspecified whether esophagitis present  - Stable with current med  regimen  Panlobular emphysema (HCC) - Plan: AMB Referral [facility] Care Management  - Few coarse breath sounds, wheeze.  Intermittent dosing of Anora Ellipta due to cost.  Recommended consistent use for now and will refer to care management, pharmacy to see if there may be assistance programs or cost savings options.  If not can look into alternative medications that may be more cost effective.  No orders of the defined types were placed in this encounter.  Patient Instructions  Thank you for coming in today. No change in medications at this time.  Upper blood pressure number was borderline elevated but your lower blood pressure number is on the lower side.  With the intermittent use of the furosemide  for swelling that can also lower blood pressure.  I think we should keep lisinopril  same dose for now.  I will reach out to your cardiologist about the pericardial effusion or fluid surrounding the heart that was seen on the most recent CT scan.  Tried to restart the Anoro daily to see if that helps with some of the wheezing, emphysema symptoms.  I have placed a referral to a pharmacist to evaluate for cost savings with that medications or alternatives if needed.  They should be contacting you soon.  If there are any concerns on your bloodwork, I will let you know. Take care and please reach out to me with any questions.  I will see you in 3 months.     Signed,   Albert Pines, MD [facility] 03/16/24 5:33 PM       [1]  Allergies Allergen Reactions   Lipitor [Atorvastatin]     myalgia   Pravastatin Other (See Comments)    myalgia   "

## 2024-03-17 ENCOUNTER — Telehealth: Payer: Self-pay

## 2024-03-17 NOTE — Telephone Encounter (Signed)
 Received the following message from Dr. Monetta below:  I took a moment and reviewed his chart less than a year ago echocardiogram showed small effusion in my office. I will arrange for repeat echocardiogram we can do it at the med center in [facility] for ease of access and in general my experience is that radiology reads 1 grade higher than its on CT versus echocardiogram and I will see him back in the office afterwards.   Attempted to call the patient to inform him of Dr. Leandrew recommendation. Patient did not answer the phone and a message was left for the patient to call back.

## 2024-03-17 NOTE — Progress Notes (Unsigned)
 Care Guide Pharmacy Note  03/17/2024 Name: Albert Hardy. MRN: [id number] DOB: August 10, 1955  Referred By: [name], MD Reason for referral: Call Attempt #1, Complex Care Management (Outreach to sch ref w/ Pharm), and Call Attempt #2   [name]. is a 69 y.o. year old male who is a primary care patient of [name], MD.  [name] was referred to the pharmacist for assistance related to: HTN, HLD, and lung cancer. A second unsuccessful telephone outreach was attempted today to contact the patient who was referred to the pharmacy team for assistance with medication assistance. Additional attempts will be made to contact the patient.  Doyce Razor [facility], [facility] Guide Direct Dial: [phone]  Fax: [phone]

## 2024-03-21 ENCOUNTER — Inpatient Hospital Stay

## 2024-03-21 ENCOUNTER — Inpatient Hospital Stay: Admitting: Internal Medicine

## 2024-03-21 ENCOUNTER — Inpatient Hospital Stay: Attending: Internal Medicine | Admitting: Dietician

## 2024-03-21 ENCOUNTER — Inpatient Hospital Stay: Attending: Internal Medicine

## 2024-03-23 ENCOUNTER — Inpatient Hospital Stay

## 2024-04-11 ENCOUNTER — Inpatient Hospital Stay

## 2024-04-11 ENCOUNTER — Inpatient Hospital Stay: Attending: Internal Medicine

## 2024-04-11 ENCOUNTER — Inpatient Hospital Stay: Admitting: Internal Medicine

## 2024-04-13 ENCOUNTER — Inpatient Hospital Stay

## 2024-04-27 ENCOUNTER — Ambulatory Visit (INDEPENDENT_AMBULATORY_CARE_PROVIDER_SITE_OTHER): Admitting: Otolaryngology

## 2024-06-15 ENCOUNTER — Ambulatory Visit: Admitting: Family Medicine
